# Patient Record
Sex: Female | Born: 1955
Health system: Southern US, Community
[De-identification: ages and names within clinical notes are randomized; demographics above are authoritative.]

## PROBLEM LIST (undated history)

## (undated) DIAGNOSIS — C801 Malignant (primary) neoplasm, unspecified: Secondary | ICD-10-CM

## (undated) DIAGNOSIS — R079 Chest pain, unspecified: Secondary | ICD-10-CM

## (undated) DIAGNOSIS — I4892 Unspecified atrial flutter: Secondary | ICD-10-CM

## (undated) DIAGNOSIS — E0590 Thyrotoxicosis, unspecified without thyrotoxic crisis or storm: Principal | ICD-10-CM

## (undated) DIAGNOSIS — I499 Cardiac arrhythmia, unspecified: Secondary | ICD-10-CM

## (undated) DIAGNOSIS — E119 Type 2 diabetes mellitus without complications: Secondary | ICD-10-CM

## (undated) DIAGNOSIS — E785 Hyperlipidemia, unspecified: Secondary | ICD-10-CM

## (undated) DIAGNOSIS — I10 Essential (primary) hypertension: Secondary | ICD-10-CM

## (undated) DIAGNOSIS — D490 Neoplasm of unspecified behavior of digestive system: Secondary | ICD-10-CM

## (undated) DIAGNOSIS — M1990 Unspecified osteoarthritis, unspecified site: Secondary | ICD-10-CM

## (undated) HISTORY — DX: Unspecified atrial flutter: I48.92

## (undated) HISTORY — DX: Hereditary hemochromatosis: E83.110

## (undated) HISTORY — PX: TONSILLECTOMY: SHX5217

## (undated) HISTORY — DX: Neoplasm of unspecified behavior of digestive system: D49.0

## (undated) HISTORY — PX: COLONOSCOPY W/ POLYPECTOMY: SHX1380

## (undated) HISTORY — DX: Thyrotoxicosis, unspecified without thyrotoxic crisis or storm: E05.90

## (undated) HISTORY — DX: Hyperlipidemia, unspecified: E78.5

## (undated) HISTORY — DX: Chest pain, unspecified: R07.9

---

## 1980-03-13 HISTORY — PX: CHOLECYSTECTOMY: SHX55

## 1997-10-04 ENCOUNTER — Emergency Department (HOSPITAL_COMMUNITY): Admission: EM | Admit: 1997-10-04 | Discharge: 1997-10-04 | Payer: Self-pay | Admitting: Emergency Medicine

## 1999-01-25 ENCOUNTER — Encounter (INDEPENDENT_AMBULATORY_CARE_PROVIDER_SITE_OTHER): Payer: Self-pay

## 1999-01-25 ENCOUNTER — Other Ambulatory Visit: Admission: RE | Admit: 1999-01-25 | Discharge: 1999-01-25 | Payer: Self-pay | Admitting: Obstetrics and Gynecology

## 1999-09-12 ENCOUNTER — Encounter: Payer: Self-pay | Admitting: Obstetrics and Gynecology

## 1999-09-12 ENCOUNTER — Encounter: Admission: RE | Admit: 1999-09-12 | Discharge: 1999-09-12 | Payer: Self-pay | Admitting: Obstetrics and Gynecology

## 2000-03-22 ENCOUNTER — Encounter: Admission: RE | Admit: 2000-03-22 | Discharge: 2000-03-22 | Payer: Self-pay | Admitting: Obstetrics and Gynecology

## 2000-03-22 ENCOUNTER — Encounter: Payer: Self-pay | Admitting: *Deleted

## 2001-06-14 ENCOUNTER — Encounter: Admission: RE | Admit: 2001-06-14 | Discharge: 2001-06-14 | Payer: Self-pay | Admitting: *Deleted

## 2001-06-14 ENCOUNTER — Encounter: Payer: Self-pay | Admitting: *Deleted

## 2001-06-17 ENCOUNTER — Encounter: Admission: RE | Admit: 2001-06-17 | Discharge: 2001-06-17 | Payer: Self-pay | Admitting: *Deleted

## 2001-06-17 ENCOUNTER — Encounter: Payer: Self-pay | Admitting: *Deleted

## 2003-07-03 ENCOUNTER — Emergency Department (HOSPITAL_COMMUNITY): Admission: EM | Admit: 2003-07-03 | Discharge: 2003-07-03 | Payer: Self-pay | Admitting: Family Medicine

## 2006-06-22 ENCOUNTER — Emergency Department (HOSPITAL_COMMUNITY): Admission: EM | Admit: 2006-06-22 | Discharge: 2006-06-22 | Payer: Self-pay | Admitting: Family Medicine

## 2007-06-18 ENCOUNTER — Encounter: Admission: RE | Admit: 2007-06-18 | Discharge: 2007-06-18 | Payer: Self-pay | Admitting: Family Medicine

## 2007-06-18 IMAGING — CR DG CHEST 2V
2 series · 2 of 2 positions shown · non-contrast
Comparison: None

CLINICAL DATA: Cough, congestion, T8

CHEST - 2 VIEW

[w chest pa]
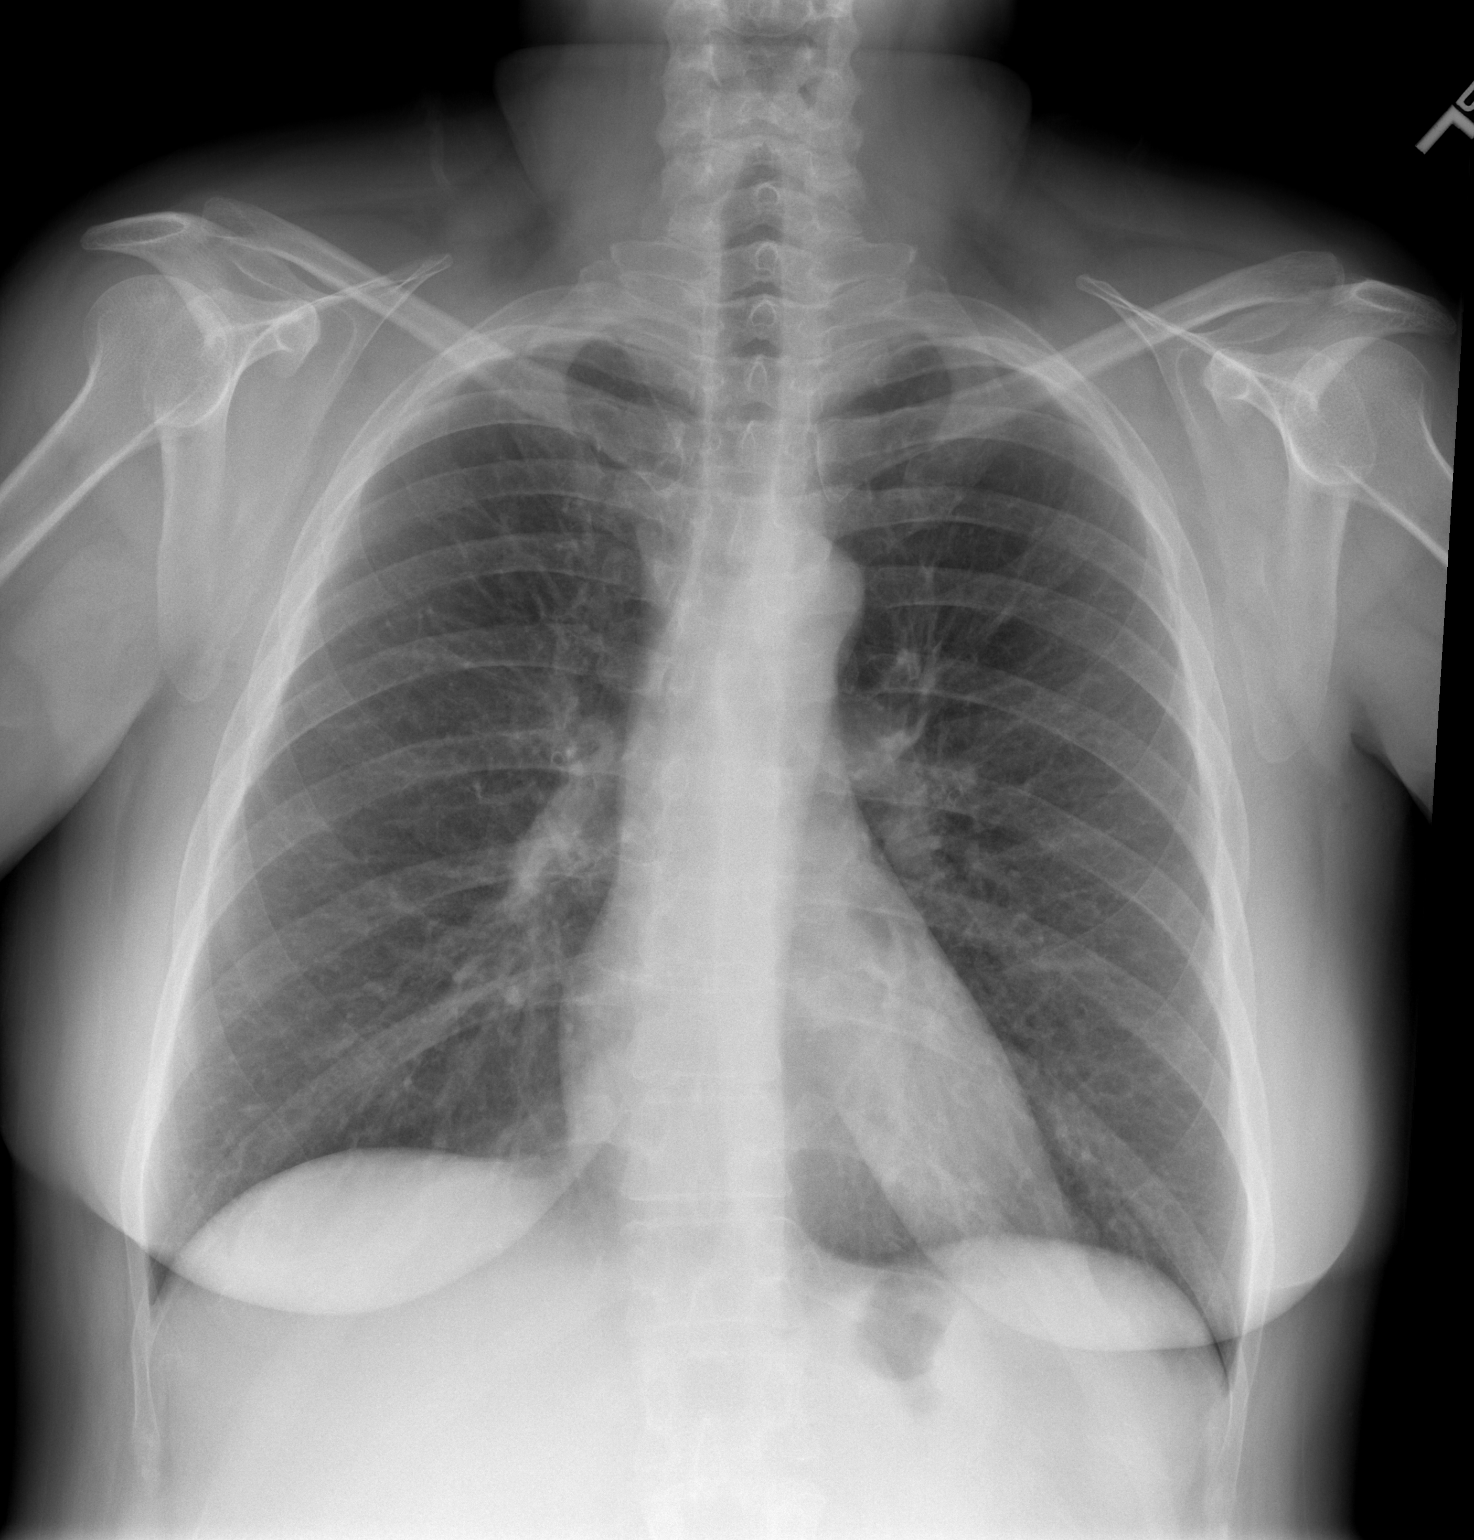

[w chest lat]
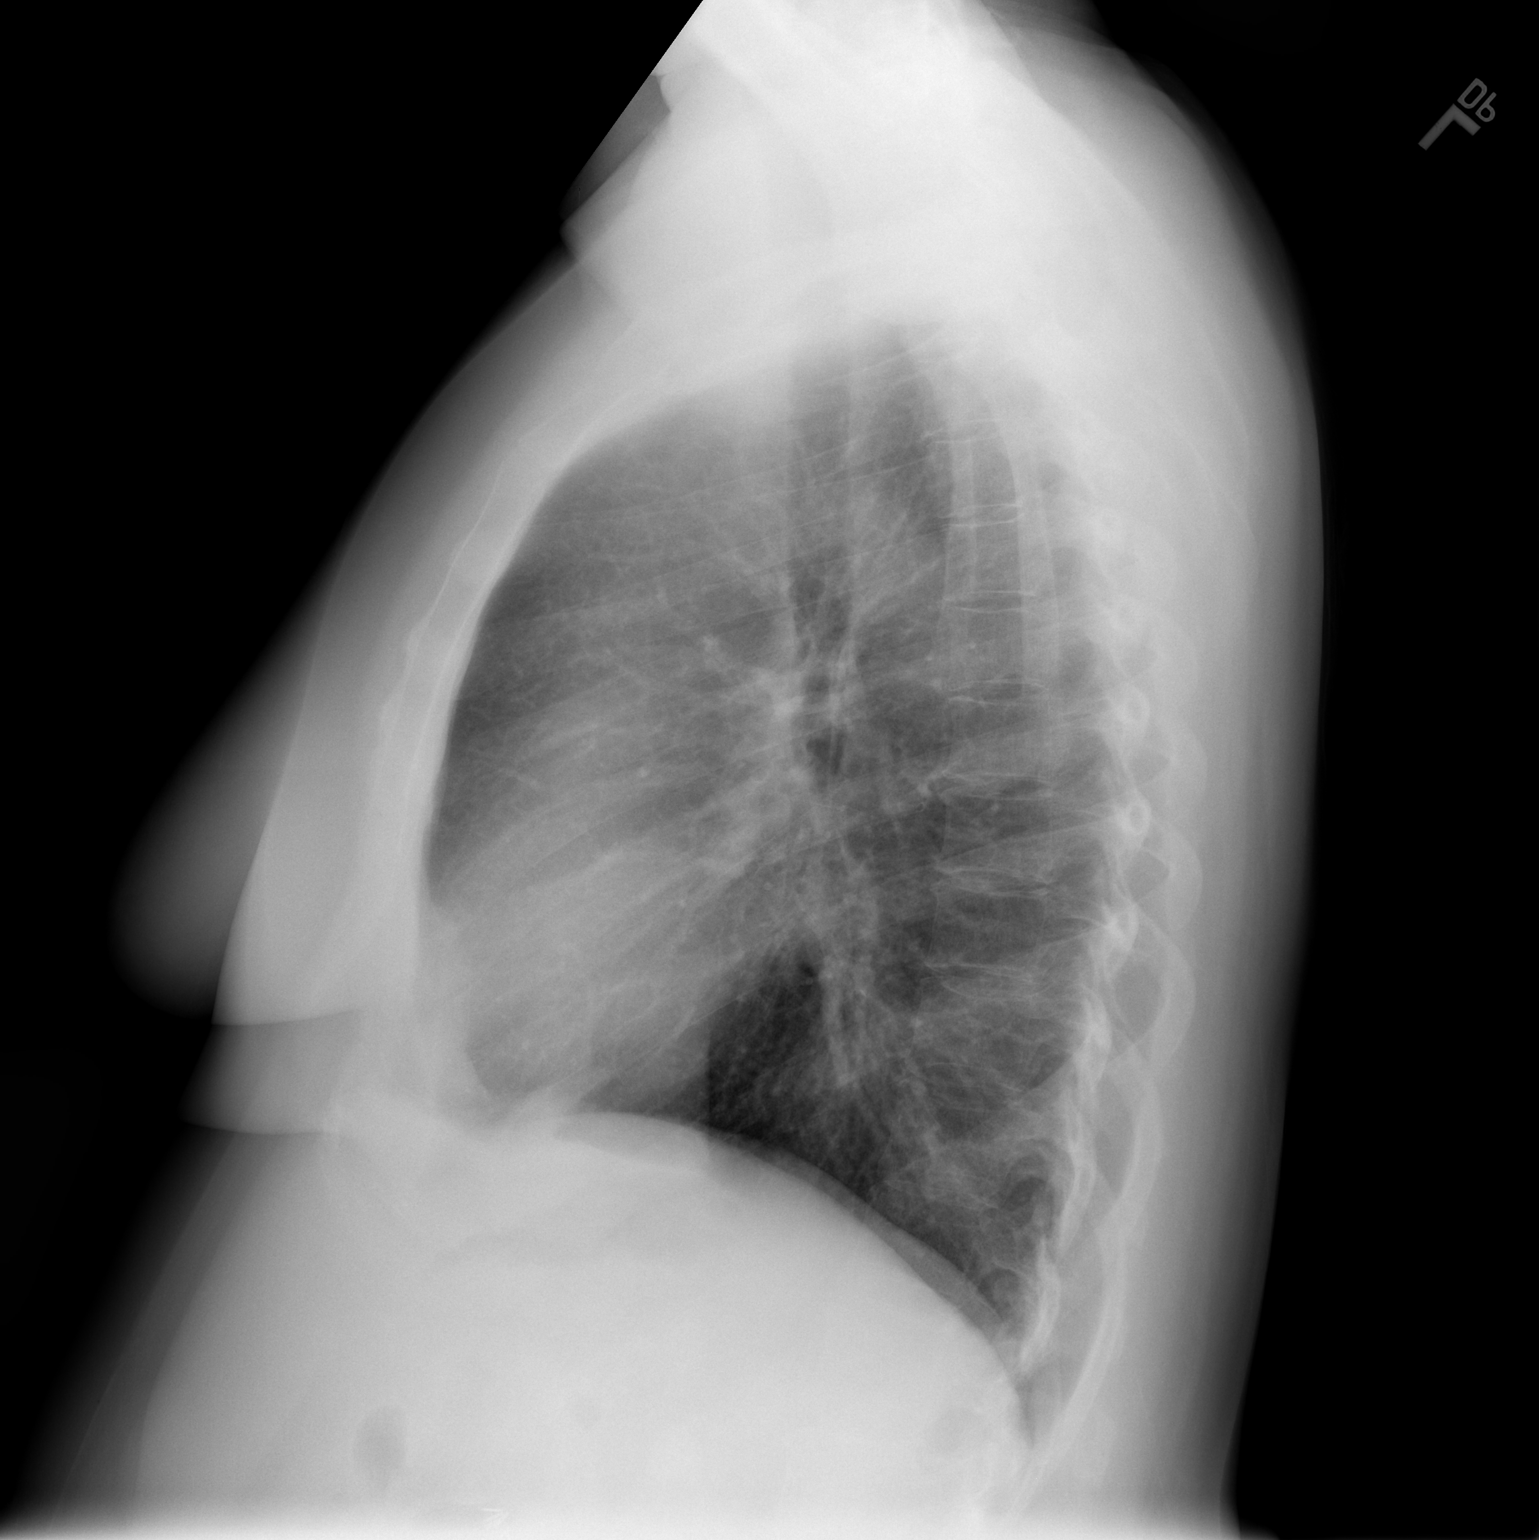

[2 of 2 positions shown; findings below may reference images not displayed]

FINDINGS: No infiltrate or effusion is seen.  Mild peribronchial
thickening is noted.  The heart is within normal limits in size.
IMPRESSION: No active lung disease.  Mild peribronchial thickening.

## 2008-09-23 ENCOUNTER — Inpatient Hospital Stay (HOSPITAL_COMMUNITY): Admission: EM | Admit: 2008-09-23 | Discharge: 2008-09-24 | Payer: Self-pay | Admitting: Emergency Medicine

## 2008-09-23 IMAGING — CR DG CHEST 2V
2 series · 2 of 2 positions shown · non-contrast
Comparison: [DATE]

CLINICAL DATA: Chest pain and tightness with diaphoresis and
indigestion.

CHEST - 2 VIEW

[w chest pa]
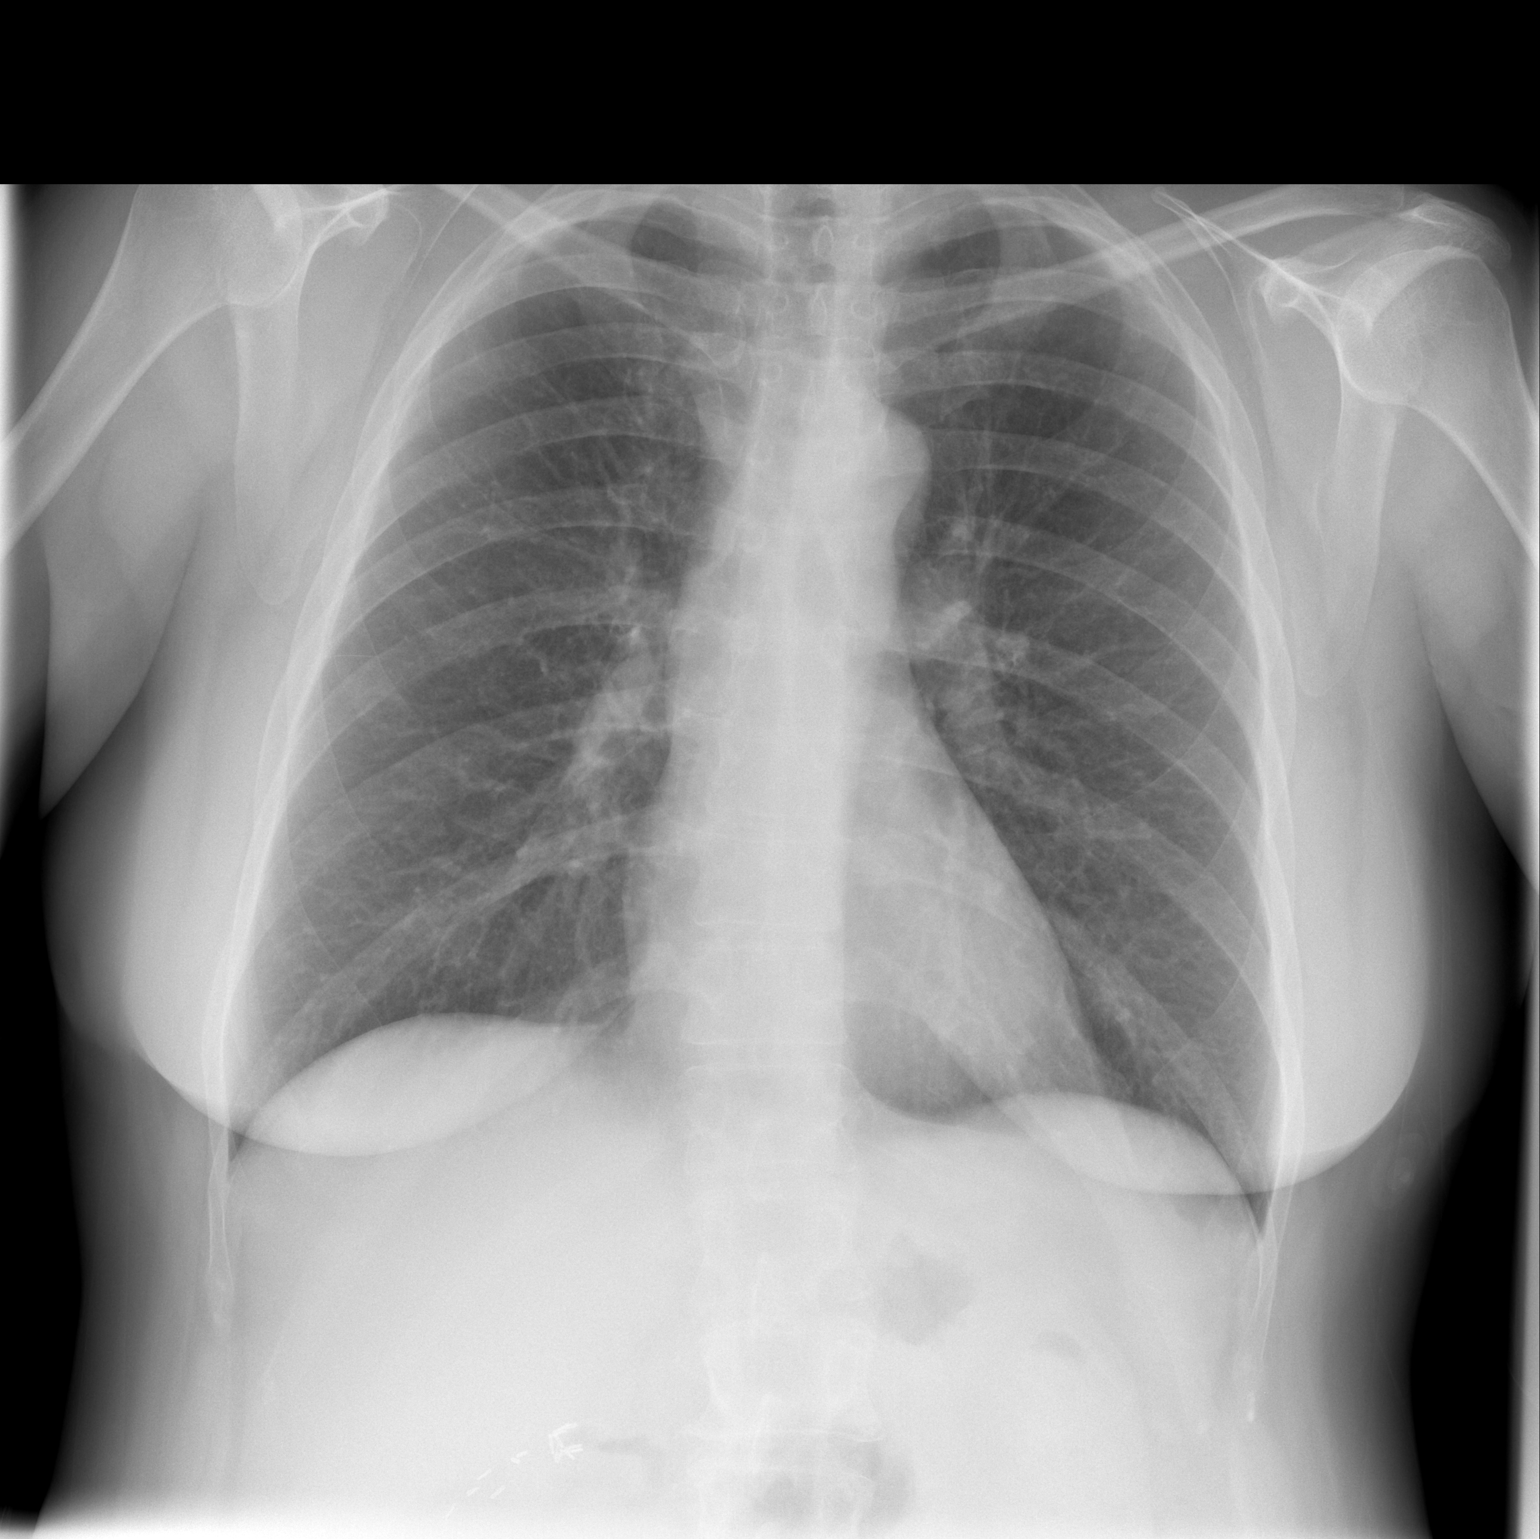

[w chest lat]
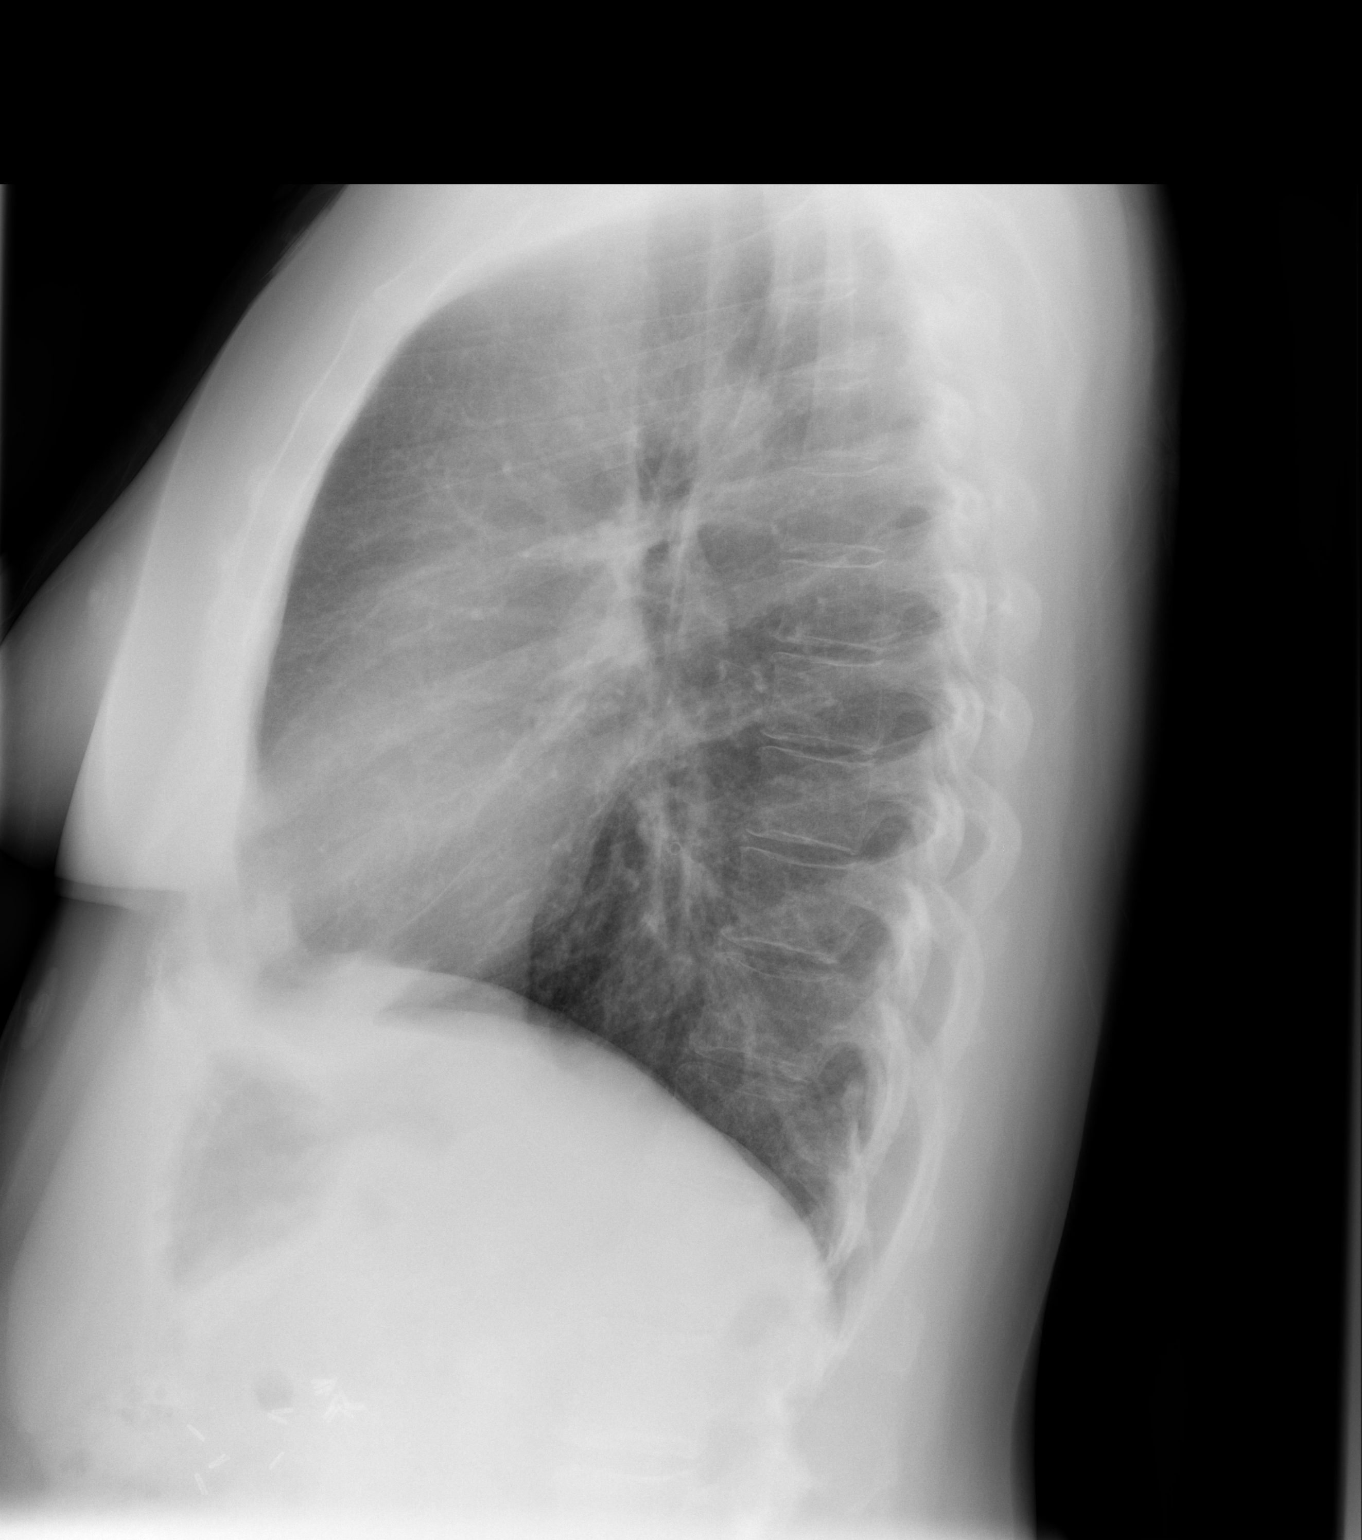

[2 of 2 positions shown; findings below may reference images not displayed]

FINDINGS: Trachea is midline.  Heart size normal.  Biapical pleural
thickening.  Lungs are clear.  No pleural fluid.
IMPRESSION: No acute findings.

## 2009-12-07 ENCOUNTER — Emergency Department (HOSPITAL_COMMUNITY): Admission: EM | Admit: 2009-12-07 | Discharge: 2009-12-07 | Payer: Self-pay | Admitting: Emergency Medicine

## 2009-12-07 IMAGING — CR DG TIBIA/FIBULA 2V*R*
2 series · 2 of 2 positions shown · non-contrast
Comparison: None.

CLINICAL DATA: Fell, pain

RIGHT TIBIA AND FIBULA - 2 VIEW

[view not recorded (1 of 2)]
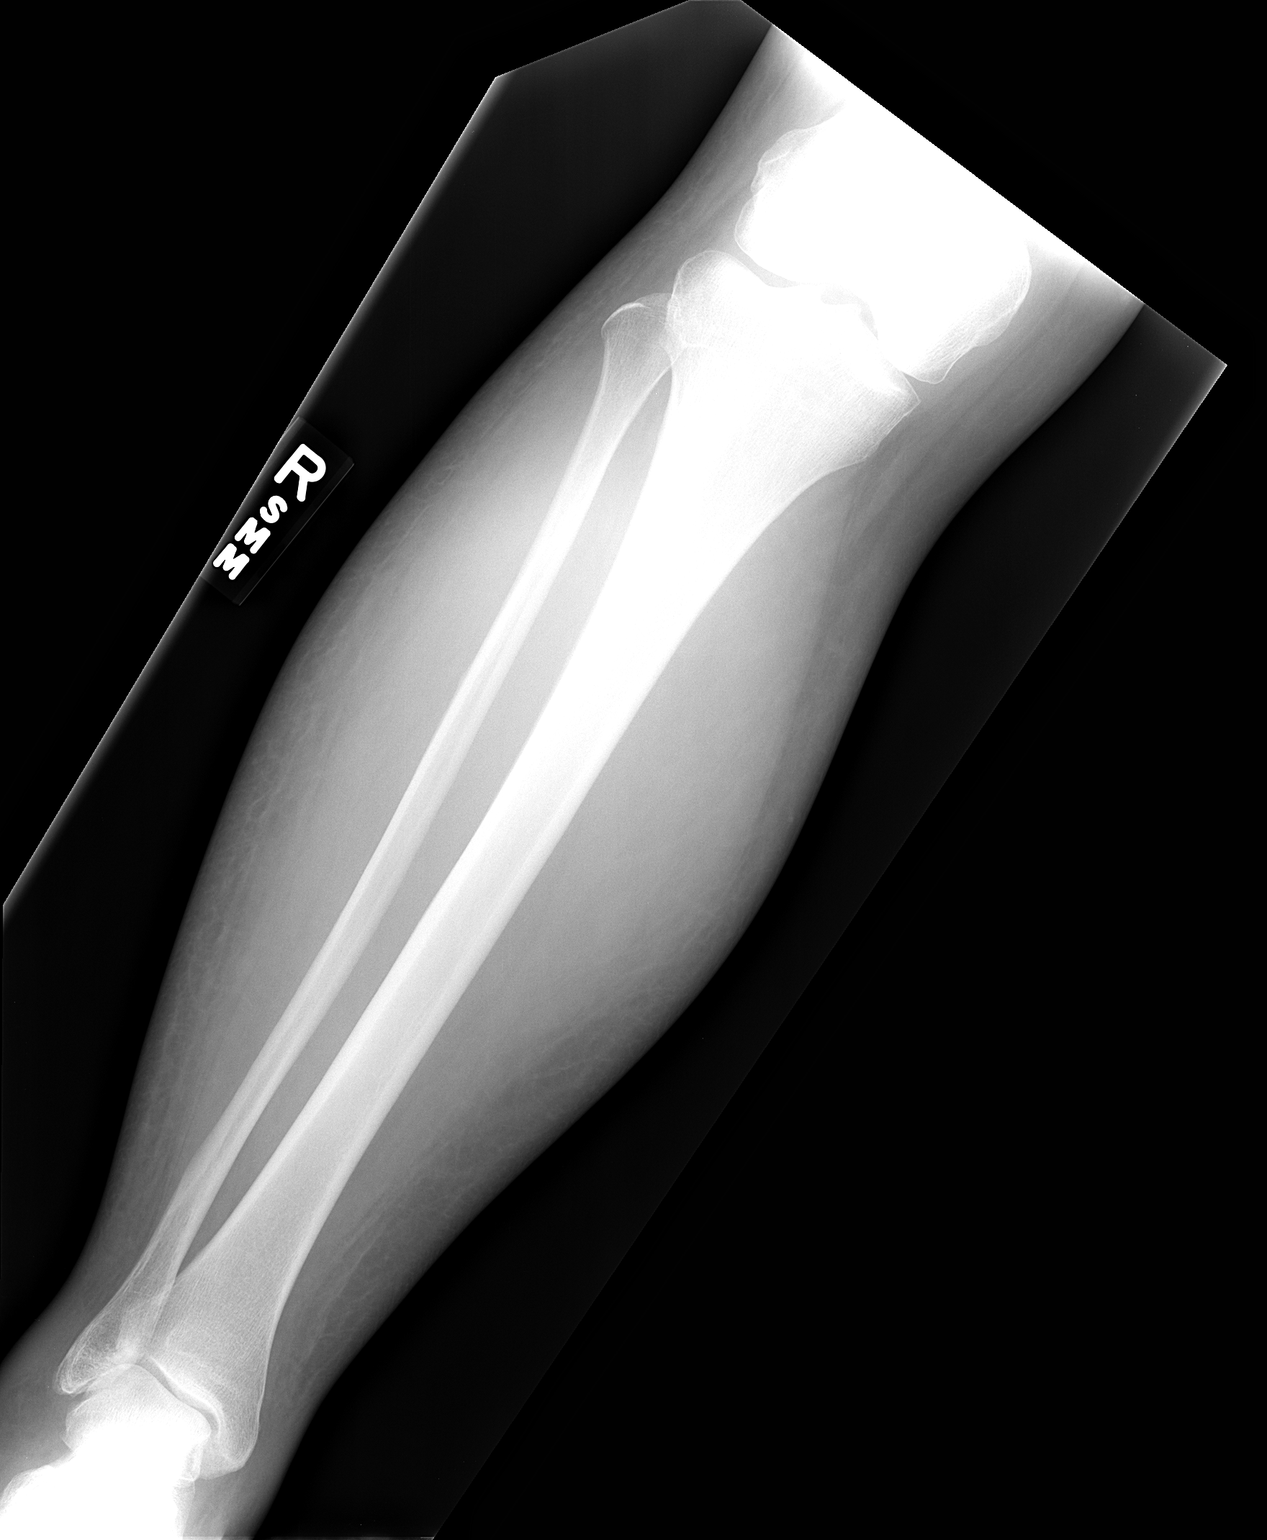

[view not recorded (2 of 2)]
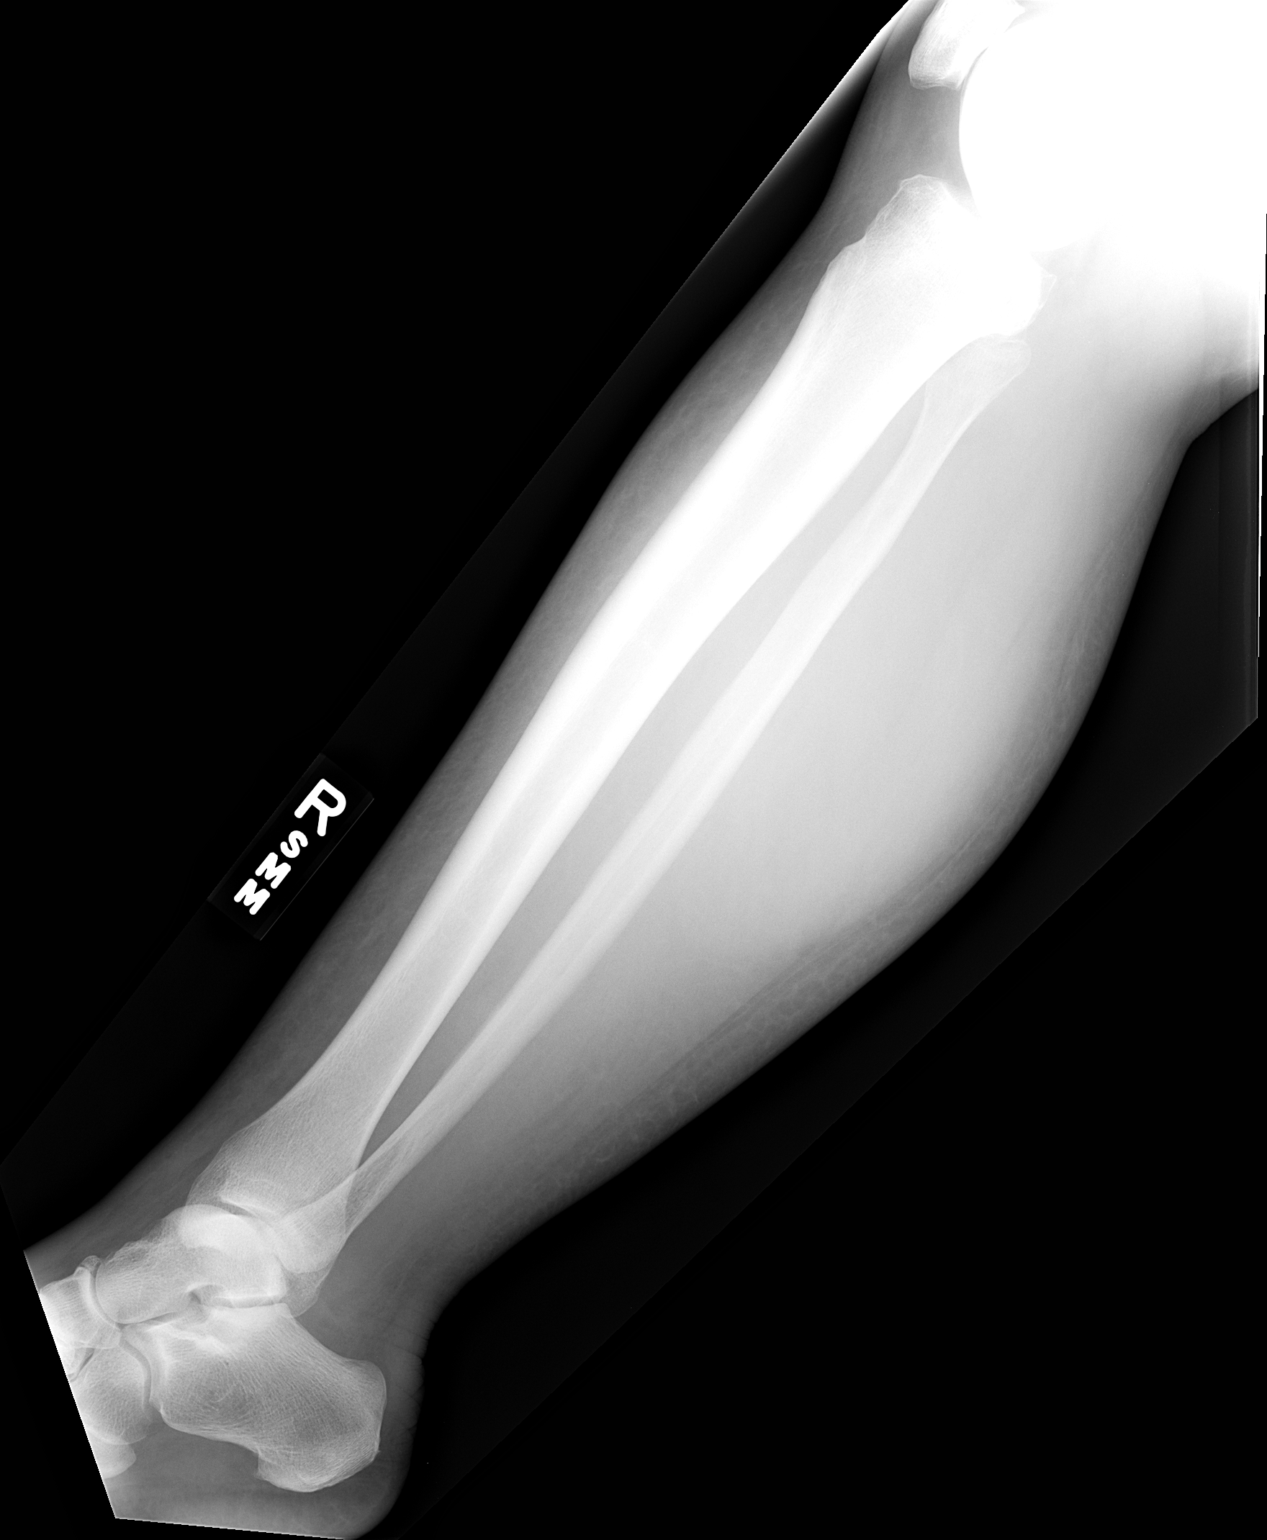

[2 of 2 positions shown; findings below may reference images not displayed]

FINDINGS: Soft tissue swelling is present.  There is no visible
fracture or dislocation.
IMPRESSION: As above.

## 2009-12-07 IMAGING — CR DG FOOT COMPLETE 3+V*R*
4 series · 4 of 4 positions shown · non-contrast
Comparison: None

CLINICAL DATA: Fall, foot pain.

RIGHT FOOT COMPLETE - 3+ VIEW

[view not recorded (1 of 4)]
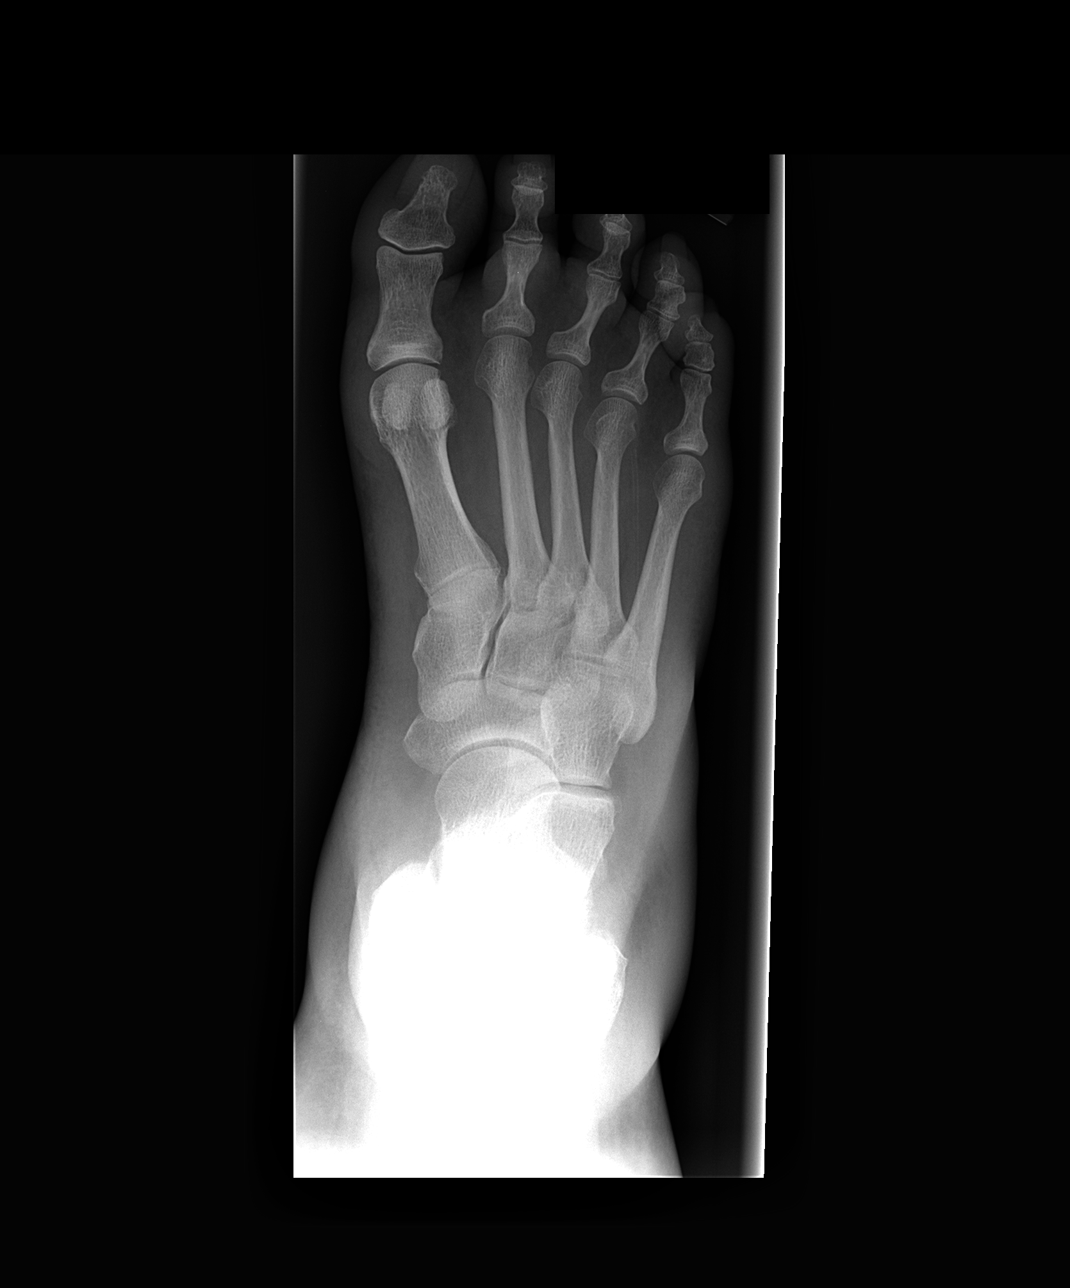

[view not recorded (2 of 4)]
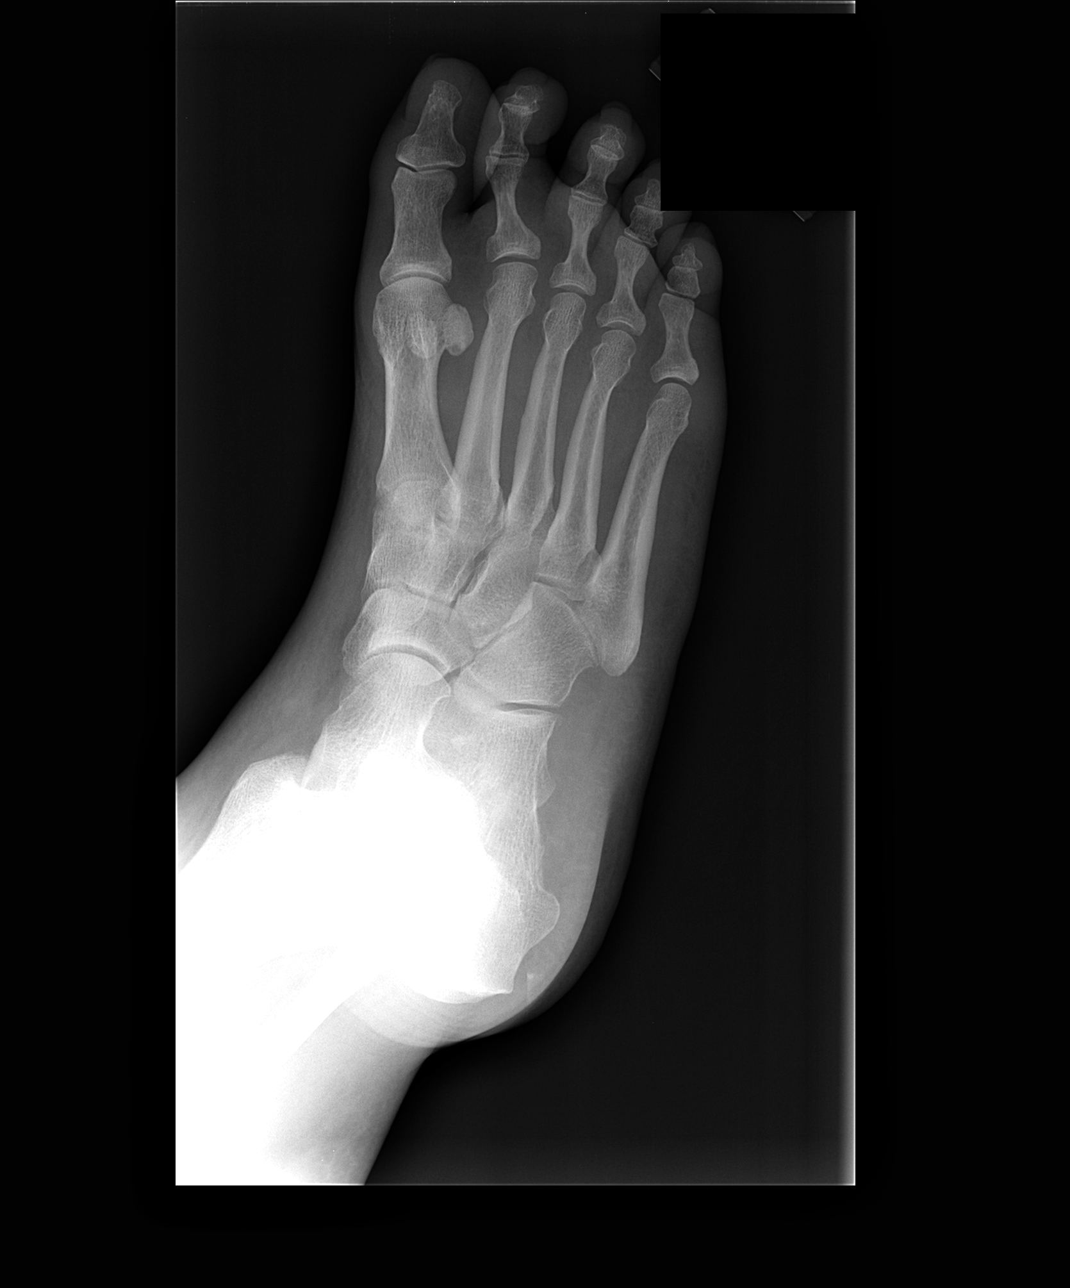

[view not recorded (3 of 4)]
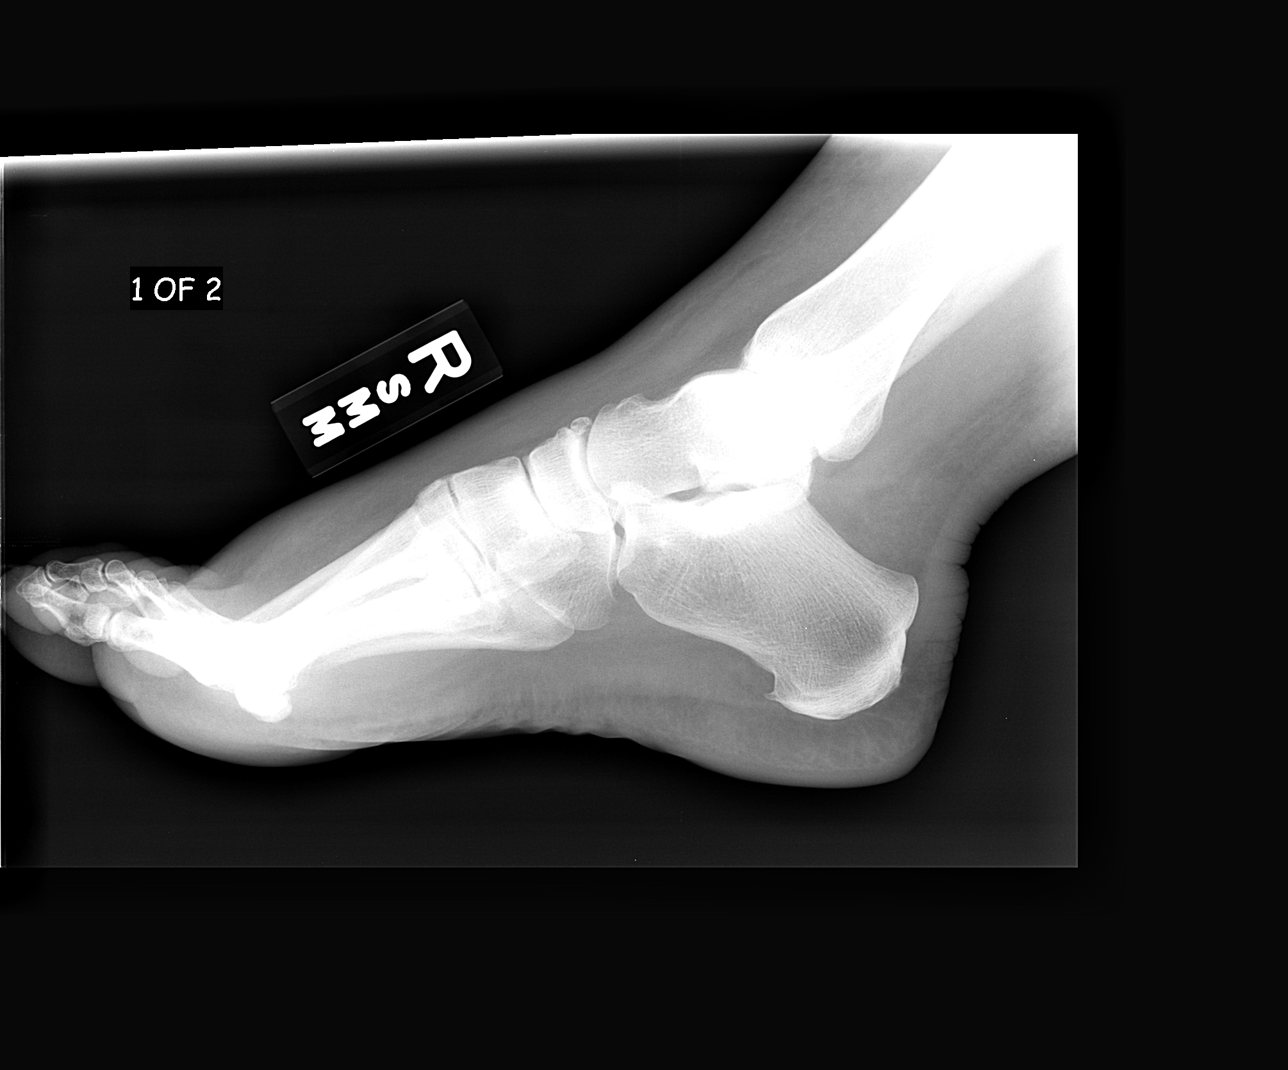

[view not recorded (4 of 4)]
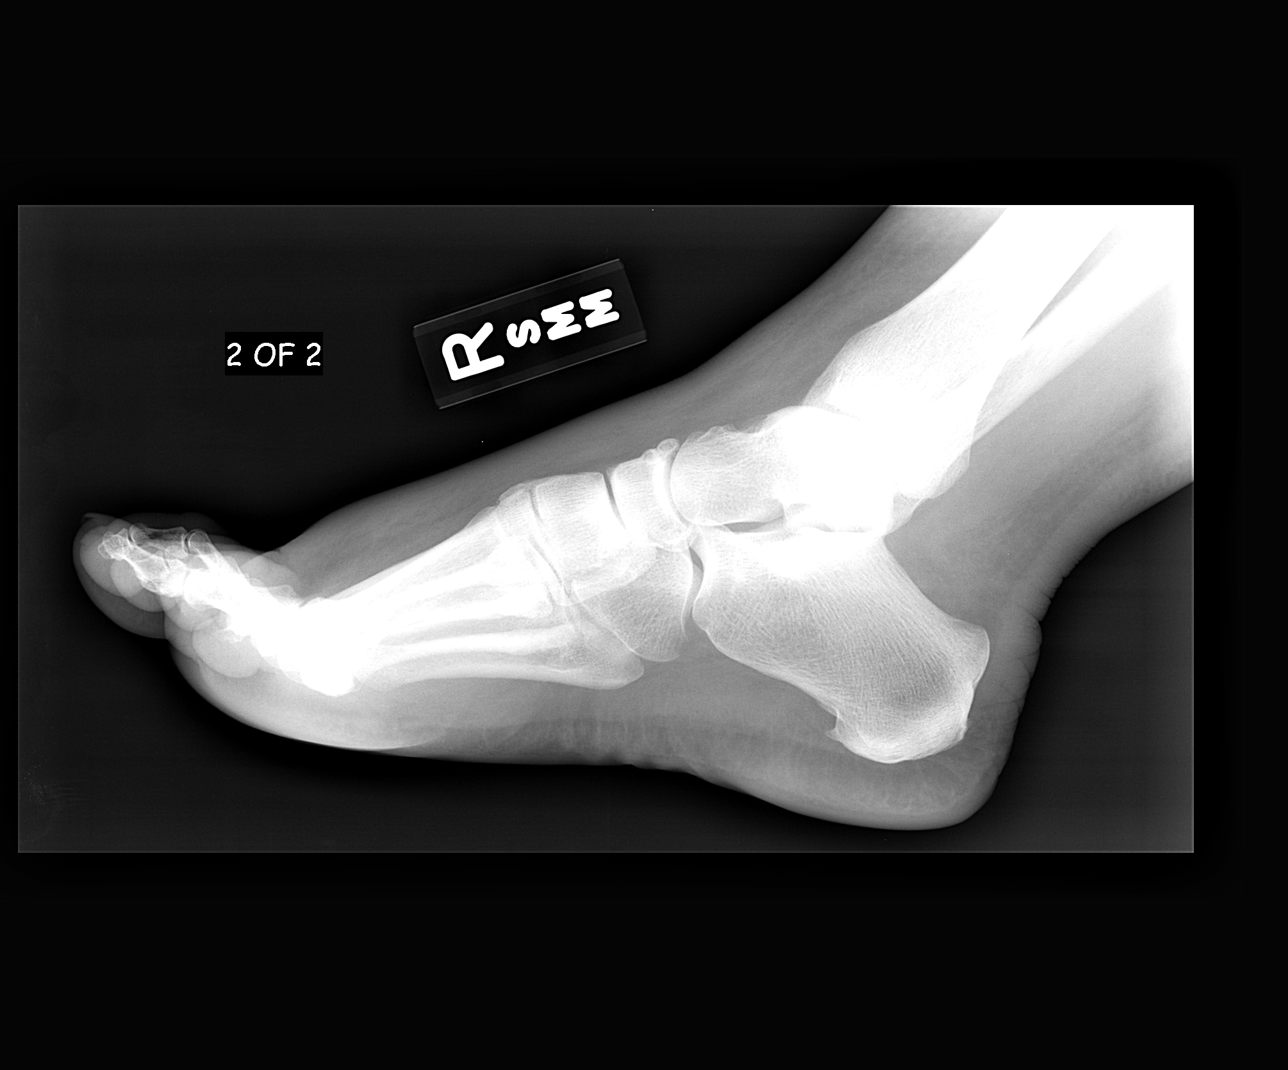

[4 of 4 positions shown; findings below may reference images not displayed]

FINDINGS: No acute bony abnormality.  Specifically, no fracture,
subluxation, or dislocation.  Soft tissues are intact.
IMPRESSION: No acute bony abnormality.

## 2010-04-04 ENCOUNTER — Encounter: Payer: Self-pay | Admitting: Interventional Cardiology

## 2010-06-20 LAB — DIFFERENTIAL
Eosinophils Absolute: 0.3 10*3/uL (ref 0.0–0.7)
Eosinophils Relative: 3 % (ref 0–5)
Lymphs Abs: 3.4 10*3/uL (ref 0.7–4.0)
Monocytes Relative: 6 % (ref 3–12)
Neutrophils Relative %: 58 % (ref 43–77)

## 2010-06-20 LAB — POCT CARDIAC MARKERS
Myoglobin, poc: 50.9 ng/mL (ref 12–200)
Myoglobin, poc: 56.2 ng/mL (ref 12–200)
Troponin i, poc: <0.05 ng/mL (ref 0.00–0.09)

## 2010-06-20 LAB — MAGNESIUM: Magnesium: 1.9 mg/dL (ref 1.5–2.5)

## 2010-06-20 LAB — GLUCOSE, CAPILLARY
Glucose-Capillary: 116 mg/dL — ABNORMAL HIGH (ref 70–99)
Glucose-Capillary: 117 mg/dL — ABNORMAL HIGH (ref 70–99)
Glucose-Capillary: 123 mg/dL — ABNORMAL HIGH (ref 70–99)

## 2010-06-20 LAB — CBC
HCT: 50.5 % — ABNORMAL HIGH (ref 36.0–46.0)
MCV: 91.4 fL (ref 78.0–100.0)
RBC: 5.53 MIL/uL — ABNORMAL HIGH (ref 3.87–5.11)
WBC: 10.3 10*3/uL (ref 4.0–10.5)

## 2010-06-20 LAB — COMPREHENSIVE METABOLIC PANEL
ALT: 22 U/L (ref 0–35)
Calcium: 9.7 mg/dL (ref 8.4–10.5)
Creatinine, Ser: 0.66 mg/dL (ref 0.4–1.2)
Glucose, Bld: 88 mg/dL (ref 70–99)
Sodium: 134 mEq/L — ABNORMAL LOW (ref 135–145)
Total Protein: 7 g/dL (ref 6.0–8.3)

## 2010-06-20 LAB — TSH: TSH: 0.966 u[IU]/mL (ref 0.350–4.500)

## 2010-06-20 LAB — CK TOTAL AND CKMB (NOT AT ARMC)
Relative Index: INVALID (ref 0.0–2.5)
Total CK: 32 U/L (ref 7–177)

## 2010-06-20 LAB — TROPONIN I: Troponin I: 0.02 ng/mL (ref 0.00–0.06)

## 2010-07-26 NOTE — Discharge Summary (Signed)
NAMEMARJARIE, IRION NO.:  [id number]   MEDICAL RECORD NO.:  [id number]          PATIENT TYPE:  INP   LOCATION:  2035                         FACILITY:  [facility]   PHYSICIAN:  Lyn Records, M.D.   DATE OF BIRTH:  January 07, 1956   DATE OF ADMISSION:  09/23/2008  DATE OF DISCHARGE:  09/24/2008                               DISCHARGE SUMMARY   DISCHARGE DIAGNOSES:  1. Chest pain, resolved.  2. Hypertension.  3. Diabetes mellitus.  4. Postmenopausal.   Ms. Demedeiros is a 55 year old female patient with a 81-month history of  chest pressure that has been intermittent in nature.  She had an episode  that was worse on September 23, 2008 associated with diaphoresis.  She was  placed in the hospital for observation.  Her cardiac enzymes were  negative.  For this reason we felt it was safe for her to go home and  scheduled an outpatient Cardiolite at [facility].  Our office  will contact her with the appropriate date and time.  I have given her  sublingual nitroglycerin to use if needed for chest pain.  She is to  call us if she has any further discomfort.   DISCHARGE MEDICATIONS:  Glimepiride daily, lisinopril daily, sublingual  nitroglycerin p.r.n. chest pain.   DISCHARGE INSTRUCTIONS:  She is to remain on a heart-healthy diet.  Increase activity slowly as tolerated.  She is being discharged home in  stable but improved condition.      Guy Franco, P.A.      Lyn Records, M.D.  Electronically Signed    LB/MEDQ  D:  09/24/2008  T:  09/24/2008  Job:  [id number]   cc:   L. Lupe Carney, M.D.

## 2012-12-21 ENCOUNTER — Emergency Department (HOSPITAL_COMMUNITY): Payer: Self-pay

## 2012-12-21 ENCOUNTER — Encounter (HOSPITAL_COMMUNITY): Payer: Self-pay | Admitting: Emergency Medicine

## 2012-12-21 ENCOUNTER — Observation Stay (HOSPITAL_COMMUNITY)
Admission: EM | Admit: 2012-12-21 | Discharge: 2012-12-22 | Disposition: A | Discharge status: Home / Self Care | Payer: Self-pay | Attending: Cardiovascular Disease | Admitting: Cardiovascular Disease

## 2012-12-21 ENCOUNTER — Emergency Department (HOSPITAL_COMMUNITY): Admission: EM | Admit: 2012-12-21 | Discharge: 2012-12-21 | Discharge status: Against Medical Advice | Payer: Self-pay

## 2012-12-21 DIAGNOSIS — I4892 Unspecified atrial flutter: Principal | ICD-10-CM | POA: Insufficient documentation

## 2012-12-21 DIAGNOSIS — I4891 Unspecified atrial fibrillation: Secondary | ICD-10-CM

## 2012-12-21 DIAGNOSIS — E119 Type 2 diabetes mellitus without complications: Secondary | ICD-10-CM | POA: Insufficient documentation

## 2012-12-21 DIAGNOSIS — E876 Hypokalemia: Secondary | ICD-10-CM | POA: Insufficient documentation

## 2012-12-21 DIAGNOSIS — I10 Essential (primary) hypertension: Secondary | ICD-10-CM | POA: Insufficient documentation

## 2012-12-21 DIAGNOSIS — Z79899 Other long term (current) drug therapy: Secondary | ICD-10-CM | POA: Insufficient documentation

## 2012-12-21 HISTORY — DX: Essential (primary) hypertension: I10

## 2012-12-21 HISTORY — DX: Type 2 diabetes mellitus without complications: E11.9

## 2012-12-21 LAB — BASIC METABOLIC PANEL
BUN: 15 mg/dL (ref 6–23)
BUN: 15 mg/dL (ref 6–23)
CO2: 23 mEq/L (ref 19–32)
CO2: 24 mEq/L (ref 19–32)
Calcium: 9.6 mg/dL (ref 8.4–10.5)
Calcium: 8.6 mg/dL (ref 8.4–10.5)
GFR calc non Af Amer: >90 mL/min (ref >90)
GFR calc non Af Amer: >90 mL/min (ref >90)
Glucose, Bld: 345 mg/dL — ABNORMAL HIGH (ref 70–99)
Glucose, Bld: 344 mg/dL — ABNORMAL HIGH (ref 70–99)
Potassium: 2.7 mEq/L — CL (ref 3.5–5.1)
Potassium: 3.5 mEq/L (ref 3.5–5.1)
Sodium: 134 mEq/L — ABNORMAL LOW (ref 135–145)

## 2012-12-21 LAB — CBC
HCT: 45.3 % (ref 36.0–46.0)
MCH: 30.8 pg (ref 26.0–34.0)
MCHC: 36.4 g/dL — ABNORMAL HIGH (ref 30.0–36.0)
Platelets: 234 10*3/uL (ref 150–400)
RBC: 5.35 MIL/uL — ABNORMAL HIGH (ref 3.87–5.11)
WBC: 10.7 10*3/uL — ABNORMAL HIGH (ref 4.0–10.5)

## 2012-12-21 LAB — MRSA PCR SCREENING: MRSA by PCR: NEGATIVE

## 2012-12-21 LAB — PRO B NATRIURETIC PEPTIDE: Pro B Natriuretic peptide (BNP): 50.3 pg/mL (ref 0–125)

## 2012-12-21 LAB — POCT I-STAT TROPONIN I: Troponin i, poc: 0.01 ng/mL (ref 0.00–0.08)

## 2012-12-21 IMAGING — CR DG CHEST 1V PORT
1 series · 1 of 1 positions shown · non-contrast
Comparison: No priors.

CLINICAL DATA: Heart palpitations. Shortness of breath. Cough and
congestion.

EXAM:
PORTABLE CHEST - 1 VIEW

[AP]
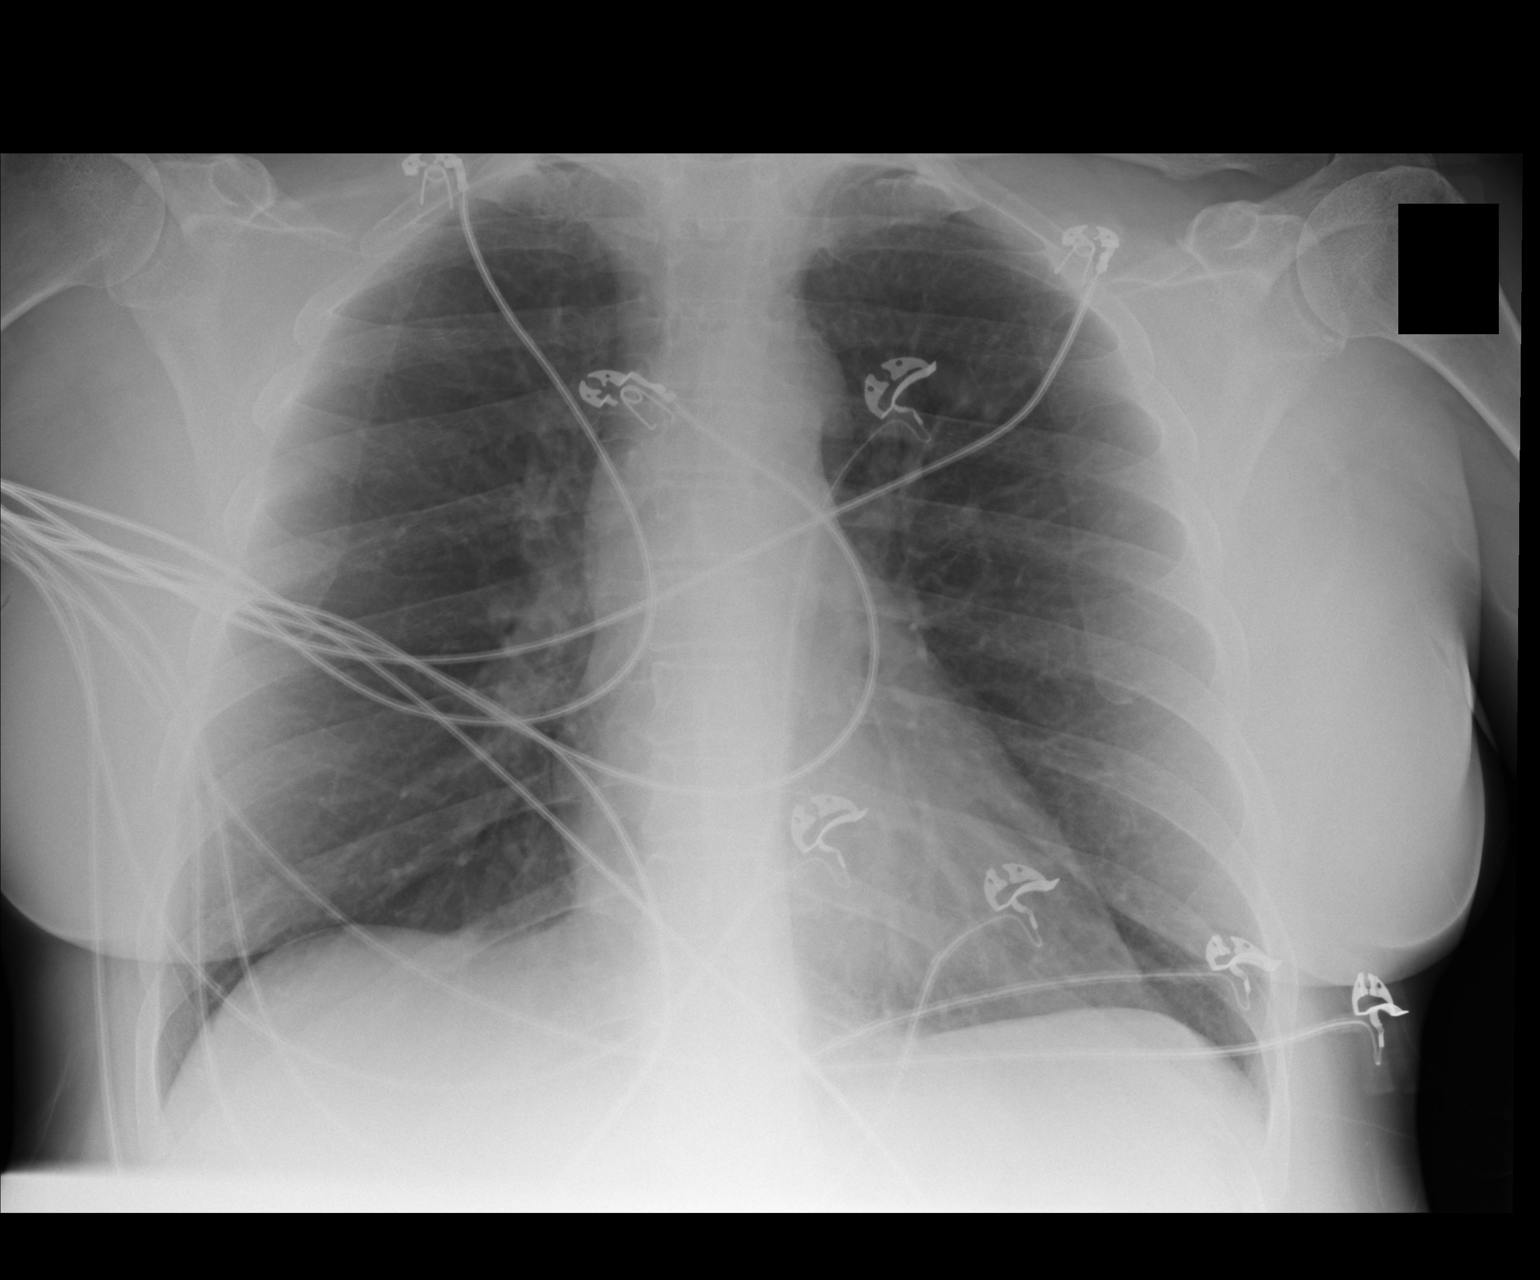

[1 of 1 positions shown; findings below may reference images not displayed]

FINDINGS: Lung volumes are normal. No consolidative airspace disease. No
pleural effusions. No pneumothorax. No pulmonary nodule or mass
noted. Pulmonary vasculature and the cardiomediastinal silhouette
are within normal limits.
IMPRESSION: 1.  No radiographic evidence of acute cardiopulmonary disease.

## 2012-12-21 MED ORDER — METOPROLOL TARTRATE 1 MG/ML IV SOLN
5.0000 mg | Freq: Once | INTRAVENOUS | Status: AC
  Administered 2012-12-21: 5 mg via INTRAVENOUS
  Filled 2012-12-21: qty 5

## 2012-12-21 MED ORDER — AMIODARONE HCL IN DEXTROSE 360-4.14 MG/200ML-% IV SOLN
30.0000 mg/h | INTRAVENOUS | Status: DC
  Administered 2012-12-21: 30 mg/h via INTRAVENOUS
  Filled 2012-12-21 (×4): qty 200

## 2012-12-21 MED ORDER — METOPROLOL TARTRATE 1 MG/ML IV SOLN
INTRAVENOUS | Status: AC
  Filled 2012-12-21: qty 5

## 2012-12-21 MED ORDER — DOCUSATE SODIUM 100 MG PO CAPS
100.0000 mg | ORAL_CAPSULE | Freq: Two times a day (BID) | ORAL | Status: DC
  Filled 2012-12-21 (×3): qty 1

## 2012-12-21 MED ORDER — AMIODARONE LOAD VIA INFUSION
150.0000 mg | Freq: Once | INTRAVENOUS | Status: AC
  Administered 2012-12-21: 150 mg via INTRAVENOUS
  Filled 2012-12-21: qty 83.34

## 2012-12-21 MED ORDER — AMIODARONE HCL IN DEXTROSE 360-4.14 MG/200ML-% IV SOLN
60.0000 mg/h | INTRAVENOUS | Status: AC
  Administered 2012-12-21 (×2): 60 mg/h via INTRAVENOUS
  Filled 2012-12-21 (×2): qty 200

## 2012-12-21 MED ORDER — HEPARIN SODIUM (PORCINE) 5000 UNIT/ML IJ SOLN
5000.0000 [IU] | Freq: Three times a day (TID) | INTRAMUSCULAR | Status: DC
  Administered 2012-12-21 – 2012-12-22 (×3): 5000 [IU] via SUBCUTANEOUS
  Filled 2012-12-21 (×5): qty 1

## 2012-12-21 MED ORDER — METOPROLOL TARTRATE 1 MG/ML IV SOLN
5.0000 mg | Freq: Once | INTRAVENOUS | Status: AC
  Administered 2012-12-21: 5 mg via INTRAVENOUS

## 2012-12-21 MED ORDER — DIGOXIN 0.25 MG/ML IJ SOLN
0.2500 mg | Freq: Once | INTRAMUSCULAR | Status: AC
  Administered 2012-12-21: 0.25 mg via INTRAVENOUS
  Filled 2012-12-21: qty 1

## 2012-12-21 MED ORDER — METOPROLOL TARTRATE 50 MG PO TABS
50.0000 mg | ORAL_TABLET | Freq: Two times a day (BID) | ORAL | Status: DC
  Administered 2012-12-21 – 2012-12-22 (×3): 50 mg via ORAL
  Filled 2012-12-21 (×3): qty 1
  Filled 2012-12-21: qty 2

## 2012-12-21 MED ORDER — DILTIAZEM LOAD VIA INFUSION
20.0000 mg | Freq: Once | INTRAVENOUS | Status: AC
  Administered 2012-12-21: 20 mg via INTRAVENOUS
  Filled 2012-12-21: qty 20

## 2012-12-21 MED ORDER — POTASSIUM CHLORIDE 10 MEQ/100ML IV SOLN
10.0000 meq | INTRAVENOUS | Status: AC
  Administered 2012-12-21: 10 meq via INTRAVENOUS
  Filled 2012-12-21: qty 100

## 2012-12-21 MED ORDER — ONDANSETRON HCL 4 MG/2ML IJ SOLN: 4.0000 mg | Freq: Four times a day (QID) | INTRAMUSCULAR | Status: DC | PRN

## 2012-12-21 MED ORDER — INSULIN ASPART 100 UNIT/ML ~~LOC~~ SOLN
4.0000 [IU] | Freq: Three times a day (TID) | SUBCUTANEOUS | Status: DC
  Administered 2012-12-22 (×2): 4 [IU] via SUBCUTANEOUS

## 2012-12-21 MED ORDER — ONDANSETRON HCL 4 MG PO TABS: 4.0000 mg | ORAL_TABLET | Freq: Four times a day (QID) | ORAL | Status: DC | PRN

## 2012-12-21 MED ORDER — POTASSIUM CHLORIDE CRYS ER 20 MEQ PO TBCR
40.0000 meq | EXTENDED_RELEASE_TABLET | Freq: Once | ORAL | Status: AC
  Administered 2012-12-21: 40 meq via ORAL
  Filled 2012-12-21: qty 2

## 2012-12-21 MED ORDER — SODIUM CHLORIDE 0.9 % IJ SOLN
3.0000 mL | Freq: Two times a day (BID) | INTRAMUSCULAR | Status: DC
  Administered 2012-12-22: 12:00:00 via INTRAVENOUS

## 2012-12-21 MED ORDER — DIGOXIN 0.25 MG/ML IJ SOLN
0.2500 mg | Freq: Once | INTRAMUSCULAR | Status: AC
  Administered 2012-12-21: 0.25 mg via INTRAVENOUS
  Filled 2012-12-21: qty 2

## 2012-12-21 MED ORDER — DILTIAZEM HCL 100 MG IV SOLR
5.0000 mg/h | INTRAVENOUS | Status: DC
  Administered 2012-12-21: 15 mg/h via INTRAVENOUS
  Administered 2012-12-21: 5 mg/h via INTRAVENOUS
  Administered 2012-12-21: 10 mg/h via INTRAVENOUS
  Administered 2012-12-21: 20 mg/h via INTRAVENOUS
  Filled 2012-12-21: qty 100

## 2012-12-21 MED ORDER — INSULIN GLARGINE 100 UNIT/ML ~~LOC~~ SOLN
10.0000 [IU] | Freq: Every day | SUBCUTANEOUS | Status: DC
  Administered 2012-12-21: 10 [IU] via SUBCUTANEOUS
  Filled 2012-12-21 (×2): qty 0.1

## 2012-12-21 MED ORDER — ACETAMINOPHEN 650 MG RE SUPP: 650.0000 mg | Freq: Four times a day (QID) | RECTAL | Status: DC | PRN

## 2012-12-21 MED ORDER — ACETAMINOPHEN 325 MG PO TABS: 650.0000 mg | ORAL_TABLET | Freq: Four times a day (QID) | ORAL | Status: DC | PRN

## 2012-12-21 MED ORDER — ADULT MULTIVITAMIN W/MINERALS CH
1.0000 | ORAL_TABLET | Freq: Every day | ORAL | Status: DC
  Administered 2012-12-21 – 2012-12-22 (×2): 1 via ORAL
  Filled 2012-12-21 (×2): qty 1

## 2012-12-21 MED ORDER — ALUM & MAG HYDROXIDE-SIMETH 200-200-20 MG/5ML PO SUSP: 30.0000 mL | Freq: Four times a day (QID) | ORAL | Status: DC | PRN

## 2012-12-21 MED ORDER — DEXTROSE 5 % IV SOLN: 60.0000 mg/h | Freq: Once | INTRAVENOUS | Status: DC

## 2012-12-21 MED ORDER — INSULIN ASPART 100 UNIT/ML ~~LOC~~ SOLN
0.0000 [IU] | Freq: Three times a day (TID) | SUBCUTANEOUS | Status: DC
  Administered 2012-12-22 (×2): 3 [IU] via SUBCUTANEOUS
  Administered 2012-12-22: 8 [IU] via SUBCUTANEOUS

## 2012-12-21 NOTE — H&P (Signed)
Christy Abbott is an 57 y.o. female.   Chief Complaint: Palpitation HPI: 57 years old female with hypertension and diabetes has palpitation without significant caffeine or other stimulant intake. She had past history of occasional palpitations but claims to have no work-up before. EKG shows atrial flutter with 2:1 AV conduction. IV diltiazem, metoprolol and amiodarone has not helped much. She has 2 doses of IV lanoxin with mild improvement in heart rate.  Past Medical History  Diagnosis Date  . Hypertension   . Diabetes mellitus without complication       History reviewed. No pertinent past surgical history.  History reviewed. No pertinent family history. Social History:  reports that she has quit smoking. She does not have any smokeless tobacco history on file. She reports that she does not drink alcohol or use illicit drugs.  Allergies: No Known Allergies   (Not in a hospital admission)  Results for orders placed during the hospital encounter of 12/21/12 (from the past 48 hour(s))  CBC     Status: Abnormal   Collection Time    12/21/12  3:14 PM      Result Value Range   WBC 10.7 (*) 4.0 - 10.5 K/uL   RBC 5.35 (*) 3.87 - 5.11 MIL/uL   Hemoglobin 16.5 (*) 12.0 - 15.0 g/dL   HCT 16.1  09.6 - 04.5 %   MCV 84.7  78.0 - 100.0 fL   MCH 30.8  26.0 - 34.0 pg   MCHC 36.4 (*) 30.0 - 36.0 g/dL   RDW 40.9  81.1 - 91.4 %   Platelets 234  150 - 400 K/uL  BASIC METABOLIC PANEL     Status: Abnormal   Collection Time    12/21/12  3:14 PM      Result Value Range   Sodium 134 (*) 135 - 145 mEq/L   Potassium 2.7 (*) 3.5 - 5.1 mEq/L   Comment: CRITICAL RESULT CALLED TO, READ BACK BY AND VERIFIED WITH:     S.[facility] 1631 12/21/12 M.CAMPBELL   Chloride 94 (*) 96 - 112 mEq/L   CO2 23  19 - 32 mEq/L   Glucose, Bld 345 (*) 70 - 99 mg/dL   BUN 15  6 - 23 mg/dL   Creatinine, Ser 7.82  0.50 - 1.10 mg/dL   Calcium 9.6  8.4 - 95.6 mg/dL   GFR calc non Af Amer >90  >90 mL/min   GFR calc Af Amer  >90  >90 mL/min   Comment: (NOTE)     The eGFR has been calculated using the CKD EPI equation.     This calculation has not been validated in all clinical situations.     eGFR's persistently <90 mL/min signify possible Chronic Kidney     Disease.  PRO B NATRIURETIC PEPTIDE     Status: None   Collection Time    12/21/12  3:14 PM      Result Value Range   Pro B Natriuretic peptide (BNP) 50.3  0 - 125 pg/mL  POCT I-STAT TROPONIN I     Status: None   Collection Time    12/21/12  3:25 PM      Result Value Range   Troponin i, poc 0.01  0.00 - 0.08 ng/mL   Comment 3            Comment: Due to the release kinetics of cTnI,     a negative result within the first hours     of the onset of  symptoms does not rule out     myocardial infarction with certainty.     If myocardial infarction is still suspected,     repeat the test at appropriate intervals.   Dg Chest Port 1 View  12/21/2012   CLINICAL DATA:  Heart palpitations. Shortness of breath. Cough and congestion.  EXAM: PORTABLE CHEST - 1 VIEW  COMPARISON:  No priors.  FINDINGS: Lung volumes are normal. No consolidative airspace disease. No pleural effusions. No pneumothorax. No pulmonary nodule or mass noted. Pulmonary vasculature and the cardiomediastinal silhouette are within normal limits.  IMPRESSION: 1.  No radiographic evidence of acute cardiopulmonary disease.   Electronically Signed   By: Trudie Reed M.D.   On: 12/21/2012 16:34    ROS No weight gain or loss. No nausea, vomiting, Occasional chest pain, No asthma or COPD, No GI bleed, No hemoptysis, No GU bleed, No stroke, seizures or psych admission.  Blood pressure 122/97, pulse 154, temperature 97.7 F (36.5 C), temperature source Oral, resp. rate 13, height 5\' 4"  (1.626 m), weight 81.647 kg (180 lb), SpO2 95.00%. General appearance: alert, cooperative and appears stated age Head: Normocephalic, without obvious abnormality, atraumatic Eyes: conjunctivae/corneas clear. PERRL,  EOM's intact. Fundi benign. Nose: Nares normal. Septum midline. Mucosa normal. No drainage or sinus tenderness. Neck: no adenopathy, no carotid bruit, no JVD, supple, symmetrical, trachea midline and thyroid not enlarged, symmetric, no tenderness/mass/nodules Resp: clear to auscultation bilaterally Cardio: regular rate and rhythm, S1, S2 normal, no murmur, click, rub or gallop GI: soft, non-tender; bowel sounds normal; no masses,  no organomegaly Extremities: extremities normal, atraumatic, no cyanosis or edema Pulses: 2+ and symmetric Skin: Skin color, texture, turgor normal. No rashes or lesions Neurologic: Alert and oriented X 3, normal strength and tone. Normal symmetric reflexes. Normal coordination and gait  Assessment/Plan Atrial flutter with rapid ventricular response DM, II Hypertension Hypokalemia  Place in observation/ Monitor/Amiodarone/Check TSH.  Jericha Bryden S 12/21/2012, 6:52 PM

## 2012-12-21 NOTE — ED Notes (Signed)
Dr. Blinda Leatherwood made aware patient HR remains 155 despite pharmaceutical intervention.

## 2012-12-21 NOTE — ED Notes (Signed)
Family at bedside. 

## 2012-12-21 NOTE — ED Notes (Signed)
ekg was done at triage and shown to dr Blinda Leatherwood

## 2012-12-21 NOTE — ED Provider Notes (Addendum)
CSN: [id number]     Arrival date & time 12/21/12  1448 History   First MD Initiated Contact with Patient 12/21/12 1509     Chief Complaint  Patient presents with  . Palpitations   (Consider location/radiation/quality/duration/timing/severity/associated sxs/prior Treatment) HPI Comments: Patient presents to the ER for evaluation of severe palpitations. Patient reports that at 2 PM she had sudden onset of feeling like her heart was racing and pounding, slight tightness in her chest. She felt slightly short of breath. She reports that her heart rate was very elevated, came to the ER for evaluation. She says she had a similar episode years ago and was admitted, but never followed up with a cardiologist.  Patient is a 57 y.o. female presenting with palpitations.  Palpitations Associated symptoms: shortness of breath     Past Medical History  Diagnosis Date  . Hypertension   . Diabetes mellitus without complication    History reviewed. No pertinent past surgical history. History reviewed. No pertinent family history. History  Substance Use Topics  . Smoking status: Former Games developer  . Smokeless tobacco: Not on file  . Alcohol Use: No   OB History   Grav Para Term Preterm Abortions TAB SAB Ect Mult Living                 Review of Systems  Respiratory: Positive for chest tightness and shortness of breath.   Cardiovascular: Positive for palpitations.  All other systems reviewed and are negative.    Allergies  Review of patient's allergies indicates no known allergies.  Home Medications  No current outpatient prescriptions on file. BP 143/94  Pulse 155  Temp(Src) 97.7 F (36.5 C) (Oral)  Resp 14  Ht 5\' 4"  (1.626 m)  Wt 180 lb (81.647 kg)  BMI 30.88 kg/m2  SpO2 99% Physical Exam  Constitutional: She is oriented to person, place, and time. She appears well-developed and well-nourished. No distress.  HENT:  Head: Normocephalic and atraumatic.  Right Ear: Hearing normal.   Left Ear: Hearing normal.  Nose: Nose normal.  Mouth/Throat: Oropharynx is clear and moist and mucous membranes are normal.  Eyes: Conjunctivae and EOM are normal. Pupils are equal, round, and reactive to light.  Neck: Normal range of motion. Neck supple.  Cardiovascular: S1 normal and S2 normal.  Tachycardia present.  Exam reveals no gallop and no friction rub.   No murmur heard. Pulmonary/Chest: Effort normal and breath sounds normal. No respiratory distress. She exhibits no tenderness.  Abdominal: Soft. Normal appearance and bowel sounds are normal. There is no hepatosplenomegaly. There is no tenderness. There is no rebound, no guarding, no tenderness at McBurney's point and negative Murphy's sign. No hernia.  Musculoskeletal: Normal range of motion.  Neurological: She is alert and oriented to person, place, and time. She has normal strength. No cranial nerve deficit or sensory deficit. Coordination normal. GCS eye subscore is 4. GCS verbal subscore is 5. GCS motor subscore is 6.  Skin: Skin is warm, dry and intact. No rash noted. No cyanosis.  Psychiatric: She has a normal mood and affect. Her speech is normal and behavior is normal. Thought content normal.    ED Course  Procedures (including critical care time)  EKG:  Date: 12/21/2012  Rate: 174   Rhythm: atrial flutter  QRS Axis: normal  ST/T Wave abnormalities: nonspecific ST/T changes  Conduction Disutrbances:none  Narrative Interpretation:   Old EKG Reviewed: none available   Labs Review Labs Reviewed  CBC - Abnormal; Notable for the  following:    WBC 10.7 (*)    RBC 5.35 (*)    Hemoglobin 16.5 (*)    MCHC 36.4 (*)    All other components within normal limits  BASIC METABOLIC PANEL - Abnormal; Notable for the following:    Sodium 134 (*)    Potassium 2.7 (*)    Chloride 94 (*)    Glucose, Bld 345 (*)    All other components within normal limits  PRO B NATRIURETIC PEPTIDE  POCT I-STAT TROPONIN I   Imaging  Review Dg Chest Port 1 View  12/21/2012   CLINICAL DATA:  Heart palpitations. Shortness of breath. Cough and congestion.  EXAM: PORTABLE CHEST - 1 VIEW  COMPARISON:  No priors.  FINDINGS: Lung volumes are normal. No consolidative airspace disease. No pleural effusions. No pneumothorax. No pulmonary nodule or mass noted. Pulmonary vasculature and the cardiomediastinal silhouette are within normal limits.  IMPRESSION: 1.  No radiographic evidence of acute cardiopulmonary disease.   Electronically Signed   By: Trudie Reed M.D.   On: 12/21/2012 16:34    EKG Interpretation   None       MDM  Diagnosis: Supraventricular tachycardia, atrial fibrillation and atrial flutter  Patient presents to the ER for evaluation of heart palpitations. Patient has a supraventricular tachycardia which is intermittently irregular. It appears to be atrial fibrillation with a rapid ventricular response intermittently with atrial flutter. Patient was initiated on a Cardizem bolus and drip without any improvement. Cardizem was therefore discontinued and the patient started on amiodarone drip. She has not had any significant slowing over conversion. Because of this, Doctor Algie Coffer has been consulted. He recommends Lopressor IV and will see the patient in the ER for admission and further treatment.  CRITICAL CARE Performed by: Gilda Crease   Total critical care time: 30  Critical care time was exclusive of separately billable procedures and treating other patients.  Critical care was necessary to treat or prevent imminent or life-threatening deterioration.  Critical care was time spent personally by me on the following activities: development of treatment plan with patient and/or surrogate as well as nursing, discussions with consultants, evaluation of patient's response to treatment, examination of patient, obtaining history from patient or surrogate, ordering and performing treatments and interventions,  ordering and review of laboratory studies, ordering and review of radiographic studies, pulse oximetry and re-evaluation of patient's condition.   Gilda Crease, MD 12/21/12 1902  Gilda Crease, MD 12/22/12 [phone]

## 2012-12-21 NOTE — ED Notes (Signed)
Reports onset today of palpitations and high bp. Reports hx of tachycardia but did not follow up with cardio. HR 170 at triage.

## 2012-12-21 NOTE — ED Notes (Signed)
Attempt report X1 

## 2012-12-21 NOTE — ED Notes (Signed)
Cardiologist at bedside.  

## 2012-12-22 LAB — CBC
Hemoglobin: 14.7 g/dL (ref 12.0–15.0)
MCH: 31.1 pg (ref 26.0–34.0)
MCHC: 36.8 g/dL — ABNORMAL HIGH (ref 30.0–36.0)
MCV: 84.5 fL (ref 78.0–100.0)

## 2012-12-22 LAB — BASIC METABOLIC PANEL
BUN: 16 mg/dL (ref 6–23)
Calcium: 8.5 mg/dL (ref 8.4–10.5)
GFR calc non Af Amer: >90 mL/min (ref >90)
Glucose, Bld: 253 mg/dL — ABNORMAL HIGH (ref 70–99)

## 2012-12-22 LAB — TROPONIN I
Troponin I: <0.3 ng/mL (ref <0.30)
Troponin I: <0.3 ng/mL (ref <0.30)
Troponin I: <0.3 ng/mL (ref <0.30)

## 2012-12-22 LAB — HEMOGLOBIN A1C: Hgb A1c MFr Bld: 11.2 % — ABNORMAL HIGH (ref <5.7)

## 2012-12-22 LAB — GLUCOSE, CAPILLARY
Glucose-Capillary: 359 mg/dL — ABNORMAL HIGH (ref 70–99)
Glucose-Capillary: 271 mg/dL — ABNORMAL HIGH (ref 70–99)
Glucose-Capillary: 197 mg/dL — ABNORMAL HIGH (ref 70–99)

## 2012-12-22 MED ORDER — BD GETTING STARTED TAKE HOME KIT: 3/10ML X 30G SYRINGES
1.0000 | Freq: Once | Status: DC
  Filled 2012-12-22: qty 1

## 2012-12-22 MED ORDER — ASPIRIN EC 325 MG PO TBEC
325.0000 mg | DELAYED_RELEASE_TABLET | Freq: Every day | ORAL | Status: DC
  Administered 2012-12-22: 325 mg via ORAL
  Filled 2012-12-22: qty 1

## 2012-12-22 MED ORDER — ASPIRIN 325 MG PO TBEC: 325.0000 mg | DELAYED_RELEASE_TABLET | Freq: Every day | ORAL | Status: DC

## 2012-12-22 MED ORDER — AMIODARONE HCL 200 MG PO TABS
200.0000 mg | ORAL_TABLET | Freq: Two times a day (BID) | ORAL | Status: DC
  Administered 2012-12-22 (×2): 200 mg via ORAL
  Filled 2012-12-22 (×2): qty 1

## 2012-12-22 MED ORDER — POTASSIUM CHLORIDE CRYS ER 10 MEQ PO TBCR
10.0000 meq | EXTENDED_RELEASE_TABLET | Freq: Two times a day (BID) | ORAL | Status: DC
  Administered 2012-12-22: 10 meq via ORAL
  Filled 2012-12-22: qty 1

## 2012-12-22 MED ORDER — DILTIAZEM HCL 30 MG PO TABS
30.0000 mg | ORAL_TABLET | Freq: Four times a day (QID) | ORAL | Status: DC
  Administered 2012-12-22 (×2): 30 mg via ORAL
  Filled 2012-12-22 (×4): qty 1

## 2012-12-22 MED ORDER — AMIODARONE HCL 200 MG PO TABS: 200.0000 mg | ORAL_TABLET | Freq: Two times a day (BID) | ORAL | Status: DC

## 2012-12-22 MED ORDER — INSULIN GLARGINE 100 UNIT/ML ~~LOC~~ SOLN: 20.0000 [IU] | Freq: Every day | SUBCUTANEOUS | Status: DC

## 2012-12-22 MED ORDER — PNEUMOCOCCAL VAC POLYVALENT 25 MCG/0.5ML IJ INJ: 0.5000 mL | INJECTION | INTRAMUSCULAR | Status: DC

## 2012-12-22 MED ORDER — POTASSIUM CHLORIDE CRYS ER 20 MEQ PO TBCR
40.0000 meq | EXTENDED_RELEASE_TABLET | Freq: Once | ORAL | Status: AC
  Administered 2012-12-22: 40 meq via ORAL
  Filled 2012-12-22: qty 2

## 2012-12-22 MED ORDER — POTASSIUM CHLORIDE CRYS ER 10 MEQ PO TBCR: 10.0000 meq | EXTENDED_RELEASE_TABLET | Freq: Two times a day (BID) | ORAL | Status: DC

## 2012-12-22 MED ORDER — DILTIAZEM HCL 60 MG PO TABS: 60.0000 mg | ORAL_TABLET | Freq: Two times a day (BID) | ORAL | Status: DC

## 2012-12-22 MED ORDER — BD GETTING STARTED TAKE HOME KIT: 3/10ML X 30G SYRINGES: 1.0000 | Freq: Once | Status: DC

## 2012-12-22 MED ORDER — INFLUENZA VAC SPLIT QUAD 0.5 ML IM SUSP: 0.5000 mL | INTRAMUSCULAR | Status: DC

## 2012-12-22 MED ORDER — METOPROLOL TARTRATE 50 MG PO TABS: 50.0000 mg | ORAL_TABLET | Freq: Two times a day (BID) | ORAL | Status: DC

## 2012-12-22 MED ORDER — "BD GETTING STARTED TAKE HOME KIT: 1ML X 30 G SYRINGES, "
1.0000 | Freq: Once | Status: AC
  Administered 2012-12-22: 1
  Filled 2012-12-22: qty 1

## 2012-12-22 NOTE — Progress Notes (Signed)
Subjective:  Converted to sinus rhythm earlier today. No chest pain,  Objective:  Vital Signs in the last 24 hours: Temp:  [97.7 F (36.5 C)-99 F (37.2 C)] 98 F (36.7 C) (10/12 0700) Pulse Rate:  [70-170] 72 (10/12 0600) Cardiac Rhythm:  [-] Normal sinus rhythm (10/12 0600) Resp:  [8-22] 18 (10/12 0600) BP: (110-165)/(59-120) 128/86 mmHg (10/12 0600) SpO2:  [93 %-100 %] 94 % (10/12 0600) Weight:  [80.3 kg (177 lb 0.5 oz)-81.647 kg (180 lb)] 80.3 kg (177 lb 0.5 oz) (10/12 0500)  Physical Exam: BP Readings from Last 1 Encounters:  12/22/12 128/86     Wt Readings from Last 1 Encounters:  12/22/12 80.3 kg (177 lb 0.5 oz)    Weight change:   HEENT: [place]/AT, Eyes-PERL, EOMI, Conjunctiva-Pink, Sclera-Non-icteric Neck: No JVD, No bruit, Trachea midline. Lungs:  Clear, Bilateral. Cardiac:  Regular rhythm, normal S1 and S2, no S3.  Abdomen:  Soft, non-tender. Extremities:  No edema present. No cyanosis. No clubbing. CNS: AxOx3, Cranial nerves grossly intact, moves all 4 extremities. Right handed. Skin: Warm and dry.   Intake/Output from previous day: 10/11 0701 - 10/12 0700 In: 333.4 [I.V.:333.4] Out: 2700 [Urine:2700]    Lab Results: BMET    Component Value Date/Time   NA 134* 12/22/2012 0340   K 3.6 12/22/2012 0340   CL 99 12/22/2012 0340   CO2 22 12/22/2012 0340   GLUCOSE 253* 12/22/2012 0340   BUN 16 12/22/2012 0340   CREATININE 0.55 12/22/2012 0340   CALCIUM 8.5 12/22/2012 0340   GFRNONAA >90 12/22/2012 0340   GFRAA >90 12/22/2012 0340   CBC    Component Value Date/Time   WBC 11.0* 12/22/2012 0340   RBC 4.72 12/22/2012 0340   HGB 14.7 12/22/2012 0340   HCT 39.9 12/22/2012 0340   PLT 223 12/22/2012 0340   MCV 84.5 12/22/2012 0340   MCH 31.1 12/22/2012 0340   MCHC 36.8* 12/22/2012 0340   RDW 12.5 12/22/2012 0340   LYMPHSABS 3.4 09/23/2008 1312   MONOABS 0.6 09/23/2008 1312   EOSABS 0.3 09/23/2008 1312   BASOSABS 0.1 09/23/2008 1312   CARDIAC ENZYMES Lab  Results  Component Value Date   CKTOTAL 31 09/24/2008   CKMB 0.7 09/24/2008   TROPONINI <0.30 12/22/2012    Scheduled Meds: . docusate sodium  100 mg Oral BID  . heparin  5,000 Units Subcutaneous Q8H  . [START ON 12/23/2012] influenza vac split quadrivalent PF  0.5 mL Intramuscular Tomorrow-1000  . insulin aspart  0-15 Units Subcutaneous TID WC  . insulin aspart  4 Units Subcutaneous TID WC  . insulin glargine  10 Units Subcutaneous QHS  . metoprolol      . metoprolol tartrate  50 mg Oral BID  . multivitamin with minerals  1 tablet Oral Daily  . [START ON 12/23/2012] pneumococcal 23 valent vaccine  0.5 mL Intramuscular Tomorrow-1000  . sodium chloride  3 mL Intravenous Q12H   Continuous Infusions: . amiodarone (NEXTERONE PREMIX) 360 mg/200 mL dextrose 30 mg/hr (12/22/12 0600)  . diltiazem (CARDIZEM) infusion 5 mg/hr (12/22/12 0100)   PRN Meds:.acetaminophen, acetaminophen, alum & mag hydroxide-simeth, ondansetron (ZOFRAN) IV, ondansetron  Assessment/Plan: Atrial flutter with rapid ventricular response-Now SR  DM, II - Improving control Hypertension -Improved control Hypokalemia- Resolved  Continue medical treatment.   LOS: 1 day    Orpah Cobb  MD  12/22/2012, 8:20 AM

## 2012-12-22 NOTE — Progress Notes (Signed)
   CARE MANAGEMENT NOTE 12/22/2012  Patient:  Christy Abbott, Christy Abbott   Account Number:  [id number]  Date Initiated:  12/22/2012  Documentation initiated by:  [facility]  Subjective/Objective Assessment:   JXB:JYNWGN palpitations     Action/Plan:   discharge planning   Anticipated DC Date:  12/24/2012   Anticipated DC Plan:        DC Planning Services  CM consult      Choice offered to / List presented to:             Status of service:  In process, will continue to follow Medicare Important Message given?   (If response is "NO", the following Medicare IM given date fields will be blank) Date Medicare IM given:   Date Additional Medicare IM given:    Discharge Disposition:    Per UR Regulation:    If discussed at Long Length of Stay Meetings, dates discussed:    Comments:  12/22/12 09:02 CM met with pt in room to give her handout on [facility] and pt stated she has a PCP, Dr. Rosalio Macadamia but she has no insurance.  CM gave her [employer] [place] handout to help her get  insurance after discharge. [facility] will set up appt with clients (free of charge) to help them navigate the [employer].  Pt states she has no financial problem getting her meds as she takes advantage of the $4 plan at [facility].  If new prescriptions are given at discharge which are not affordable, MATCH is a possibility as she states she has not used this program before.  Will continue to follow for Medication aid if needed.  Freddy Jaksch, BSN, CM [phone].

## 2012-12-22 NOTE — Discharge Summary (Signed)
Physician Discharge Summary  [name] MRN: [id number] DOB/AGE: 57-Jan-1957 57 y.o.  Admit date: 12/21/2012 Discharge date: 12/22/2012  Admission Diagnoses: Atrial flutter with rapid ventricular response-Now SR  DM, II - Improving control  Hypertension -Improved control  Hypokalemia- Resolved  Discharge Diagnoses:  Principle Problem: * Atrial flutter with rapid ventricular response * DM, II  Hypertension   Hypokalemia   Discharged Condition: good  Hospital Course: 57 years old female with hypertension and diabetes had palpitation without significant caffeine or other stimulant intake. EKG showed atrial flutter with 2:1 AV conduction. She required few doses of digoxin, diltiazem, metoprolol and amiodarone to convert to sinus rhythm. She wants additional testing like TMST on OP basis. Lantus Insulin was used to improve blood sugar control.  Consults: None  Significant Diagnostic Studies: labs: Normal CBC and BMET except low potassium and elevated sugar levels.  Treatments: cardiac meds: lisinopril (Prinivil), metoprolol, diltiazem and amiodarone and insulin: Lantus  Discharge Exam: Blood pressure 151/93, pulse 76, temperature 98.7 F (37.1 C), temperature source Oral, resp. rate 12, height 5\' 4"  (1.626 m), weight 80.3 kg (177 lb 0.5 oz), SpO2 95.00%. General appearance: alert, cooperative and appears stated age  Head: Normocephalic, without obvious abnormality, atraumatic  Eyes: conjunctivae/corneas clear. PERRL, EOM's intact. Fundi benign.  Nose: Nares normal. Septum midline. Mucosa normal. No drainage or sinus tenderness.  Neck: no adenopathy, no carotid bruit, no JVD, supple, symmetrical, trachea midline and thyroid not enlarged, symmetric, no tenderness/mass/nodules  Resp: clear to auscultation bilaterally  Cardio: regular rate and rhythm, S1, S2 normal, no murmur, click, rub or gallop  GI: soft, non-tender; bowel sounds normal; no masses, no organomegaly   Extremities: extremities normal, atraumatic, no cyanosis or edema  Pulses: 2+ and symmetric  Skin: Skin color, texture, turgor normal. No rashes or lesions  Neurologic: Alert and oriented X 3, normal strength and tone. Normal symmetric reflexes. Normal coordination and gait  Disposition: 01, Home/Self care.     Medication List         amiodarone 200 MG tablet  Commonly known as:  PACERONE  Take 1 tablet (200 mg total) by mouth 2 (two) times daily.     aspirin 325 MG EC tablet  Take 1 tablet (325 mg total) by mouth daily.     diltiazem 60 MG tablet  Commonly known as:  CARDIZEM  Take 1 tablet (60 mg total) by mouth every 12 (twelve) hours.     glimepiride 4 MG tablet  Commonly known as:  AMARYL  Take 4 mg by mouth daily before breakfast.     insulin glargine 100 UNIT/ML injection  Commonly known as:  LANTUS  Inject 0.2 mLs (20 Units total) into the skin at bedtime.     lisinopril-hydrochlorothiazide 20-25 MG per tablet  Commonly known as:  PRINZIDE,ZESTORETIC  Take 1 tablet by mouth daily.     metoprolol 50 MG tablet  Commonly known as:  LOPRESSOR  Take 1 tablet (50 mg total) by mouth 2 (two) times daily.     potassium chloride 10 MEQ tablet  Commonly known as:  K-DUR,KLOR-CON  Take 1 tablet (10 mEq total) by mouth 2 (two) times daily.           Follow-up Information   Follow up with [facility] S, MD. Schedule an appointment as soon as possible for a visit in 1 week.   Specialty:  Cardiology   Contact information:   [address] [place] [address] [phone]  SignedOrpah Cobb S 12/22/2012, 4:19 PM

## 2012-12-22 NOTE — Progress Notes (Signed)
  Echocardiogram 2D Echocardiogram has been performed.  Arvil Chaco 12/22/2012, 2:55 PM

## 2012-12-22 NOTE — Progress Notes (Signed)
   CARE MANAGEMENT NOTE 12/22/2012  Patient:  Christy Abbott, Christy Abbott   Account Number:  [id number]  Date Initiated:  12/22/2012  Documentation initiated by:  [facility]  Subjective/Objective Assessment:   [id number] palpitations     Action/Plan:   discharge planning   Anticipated DC Date:  12/24/2012   Anticipated DC Plan:        DC Planning Services  CM consult  MATCH Program      Choice offered to / List presented to:             Status of service:  Completed, signed off Medicare Important Message given?   (If response is "NO", the following Medicare IM given date fields will be blank) Date Medicare IM given:   Date Additional Medicare IM given:    Discharge Disposition:  HOME/SELF CARE  Per UR Regulation:    If discussed at Long Length of Stay Meetings, dates discussed:    Comments:  12/22/12 16:54 Cm brought MATCH letter to pt and explained its use.  Pt states she understands. Pt works full time hours but not at a full-time job where she gets benefits. Pt's husband is disabled.  Pt strongly encouraged to call for an appt to get insurance.  Pt understands appt is free. No other CM needs were communicated.  Freddy Jaksch, BSN, [place] [phone].  12/22/12 09:02 CM met with pt in room to give her handout on [facility] and pt stated she has a PCP, Dr. Rosalio Macadamia but she has no insurance.  CM gave her [employer] [place] handout to help her get  insurance after discharge. [facility] will set up appt with clients (free of charge) to help them navigate the [employer].  Pt states she has no financial problem getting her meds as she takes advantage of the $4 plan at [facility].  If new prescriptions are given at discharge which are not affordable, MATCH is a possibility as she states she has not used this program before.  Will continue to follow for Medication aid if needed.  Freddy Jaksch, BSN, CM [phone].

## 2013-02-24 ENCOUNTER — Ambulatory Visit (INDEPENDENT_AMBULATORY_CARE_PROVIDER_SITE_OTHER): Payer: Self-pay | Admitting: Interventional Cardiology

## 2013-02-24 ENCOUNTER — Encounter: Payer: Self-pay | Admitting: Interventional Cardiology

## 2013-02-24 VITALS — BP 166/116 | HR 157 | Ht 64.0 in | Wt 164.1 lb

## 2013-02-24 DIAGNOSIS — I4892 Unspecified atrial flutter: Secondary | ICD-10-CM | POA: Insufficient documentation

## 2013-02-24 DIAGNOSIS — I10 Essential (primary) hypertension: Secondary | ICD-10-CM

## 2013-02-24 DIAGNOSIS — R5381 Other malaise: Secondary | ICD-10-CM

## 2013-02-24 MED ORDER — DILTIAZEM HCL ER COATED BEADS 240 MG PO CP24: 240.0000 mg | ORAL_CAPSULE | Freq: Every day | ORAL | Status: DC

## 2013-02-24 MED ORDER — RIVAROXABAN 20 MG PO TABS: 20.0000 mg | ORAL_TABLET | Freq: Every day | ORAL | Status: DC

## 2013-02-24 NOTE — Progress Notes (Signed)
[name], female   DOB: February 04, 1956, 57 y.o.   MRN: [id number]     [name] MRN: [id number] DOB/AGE: 23-Dec-1955 57 y.o.   Referring Physician Dr. Clovis Riley   Reason for Consultation palpitations  HPI: 57 y/o who has had symptomatic atrial flutter.  She was hospitalized in October for atrial flutter. She spontaneously converted to normal sinus rhythm. She received digoxin, diltiazem, metoprolol and subsequently amiodarone. All this helped her converted. She has been maintained only on aspirin for stroke prevention.  She had been doing well until Saturday. She felt more tired then usual. When she would check her pulse, she noted that it was quite rapid. She checked the blood pressure monitor and it was over 150. She comes in today for further evaluation.  Of note, I take care of this patient's mother as well. Her mother had informed me of the patient's cardiac issues. She had been waiting to get insurance before being seen.   Current Outpatient Prescriptions  Medication Sig Dispense Refill  . amiodarone (PACERONE) 200 MG tablet Take 1 tablet (200 mg total) by mouth 2 (two) times daily.  60 tablet  1  . aspirin EC 325 MG EC tablet Take 1 tablet (325 mg total) by mouth daily.  30 tablet  0  . bd getting started take home kit MISC 1 kit by Other route once.  1 kit  0  . bd getting started take home kit MISC 1 kit by Other route once.  1 kit  0  . diltiazem (CARDIZEM) 60 MG tablet Take 1 tablet (60 mg total) by mouth every 12 (twelve) hours.  60 tablet  1  . glimepiride (AMARYL) 4 MG tablet Take 4 mg by mouth daily before breakfast.      . insulin glargine (LANTUS) 100 UNIT/ML injection Inject 0.2 mLs (20 Units total) into the skin at bedtime.  10 mL  2  . lisinopril-hydrochlorothiazide (PRINZIDE,ZESTORETIC) 20-25 MG per tablet Take 1 tablet by mouth daily.      . metoprolol (LOPRESSOR) 50 MG tablet Take 1 tablet (50 mg total) by mouth 2 (two) times daily.  60  tablet  2  . potassium chloride (K-DUR,KLOR-CON) 10 MEQ tablet Take 1 tablet (10 mEq total) by mouth 2 (two) times daily.  60 tablet  2   No current facility-administered medications for this visit.   Past Medical History  Diagnosis Date  . Hypertension   . Diabetes mellitus without complication     Family History  Problem Relation Age of Onset  . Atrial fibrillation Mother   . Arrhythmia Mother   . Diabetes Mother   . Hodgkin's lymphoma Father     History   Social History  . Marital Status: Married    Spouse Name: N/A    Number of Children: N/A  . Years of Education: N/A   Occupational History  . Not on file.   Social History Main Topics  . Smoking status: Former Games developer  . Smokeless tobacco: Not on file  . Alcohol Use: No  . Drug Use: No  . Sexual Activity: Not on file   Other Topics Concern  . Not on file   Social History Narrative  . No narrative on file    No past surgical history on file.    (Not in a hospital admission)  Review of systems complete and found to be negative unless listed above .  No nausea, vomiting.  No fever chills, No focal  weakness,  No palpitations.  Physical Exam: Filed Vitals:   02/24/13 1531  BP: 166/116  Pulse: 157    Weight: 164 lb 1.9 oz (74.444 kg)  Physical exam:  /AT EOMI No JVD, No carotid bruit Tachycardic S1S2  No wheezing Soft. NT, nondistended No edema. No focal motor or sensory deficits Normal affect  Labs:   Lab Results  Component Value Date   WBC 11.0* 12/22/2012   HGB 14.7 12/22/2012   HCT 39.9 12/22/2012   MCV 84.5 12/22/2012   PLT 223 12/22/2012   No results found for this basename: NA, K, CL, CO2, BUN, CREATININE, CALCIUM, LABALBU, PROT, BILITOT, ALKPHOS, ALT, AST, GLUCOSE,  in the last 168 hours Lab Results  Component Value Date   CKTOTAL 31 09/24/2008   CKMB 0.7 09/24/2008   TROPONINI <0.30 12/22/2012    No results found for this basename: CHOL   No results found for this basename: HDL     No results found for this basename: LDLCALC   No results found for this basename: TRIG   No results found for this basename: CHOLHDL   No results found for this basename: LDLDIRECT      Radiology: Echo in October showed normal left jugular function with normal valvular function. EKG: Atrial flutter with 2:1 conduction  ASSESSMENT AND PLAN:  Atrial flutter: Symptomatic. Difficult to control heart rate. Start Xarelto, plan TEE/ablation.  I discussed this with Dr. Johney Frame. He is in agreement. We will also increase the dosage of her Cardizem. Will start Cardizem CD 240 mg daily. This should also help with her hypertension.  The patient will come back tomorrow and see Dr. Ladona Ridgel. A TEE/ ablation will be scheduled for next Tuesday.  The patient will also meet with one of the pharmacist to talk about her new anticoagulant.  Certainly long term, the plan would be to not have her on amiodarone. She likely would not need to AV nodal blocking agents. I reviewed all of her ECGs from the computer system. There is no evidence of atrial fibrillation.  Fatigue: Likely related to her high heart rate. She gets tired easily with minimal activity.    Hypertension: Start longer acting Cardizem. She forgot her dosage of medicine today. Long term, we may change our choice of antihypertensives once her atrial flutter has been resolved. Signed:   Fredric Mare, MD, [facility] 02/24/2013, 3:51 PM

## 2013-02-24 NOTE — Patient Instructions (Signed)
Your physician has recommended you make the following change in your medication:   1. Stop Diltiazem 60 mg.  2. Start Diltiazem 240 mg daily.  3. Start Xarelto 20 mg daily.  Your physician recommends that you follow up with EP tomorrow at 1:45 pm with Dr. Johney Frame.

## 2013-02-25 ENCOUNTER — Encounter: Payer: Self-pay | Admitting: Internal Medicine

## 2013-02-25 ENCOUNTER — Encounter: Payer: Self-pay | Admitting: *Deleted

## 2013-02-25 ENCOUNTER — Encounter (HOSPITAL_COMMUNITY): Payer: Self-pay | Admitting: Pharmacy Technician

## 2013-02-25 ENCOUNTER — Ambulatory Visit (INDEPENDENT_AMBULATORY_CARE_PROVIDER_SITE_OTHER): Payer: Self-pay | Admitting: Internal Medicine

## 2013-02-25 VITALS — BP 148/90 | HR 99 | Ht 64.0 in | Wt 162.1 lb

## 2013-02-25 DIAGNOSIS — I4892 Unspecified atrial flutter: Secondary | ICD-10-CM

## 2013-02-25 DIAGNOSIS — I10 Essential (primary) hypertension: Secondary | ICD-10-CM

## 2013-02-25 LAB — CBC WITH DIFFERENTIAL/PLATELET
Basophils Absolute: 0 10*3/uL (ref 0.0–0.1)
Basophils Relative: 0.3 % (ref 0.0–3.0)
Eosinophils Relative: 0.6 % (ref 0.0–5.0)
HCT: 46.4 % — ABNORMAL HIGH (ref 36.0–46.0)
Hemoglobin: 16.1 g/dL — ABNORMAL HIGH (ref 12.0–15.0)
Lymphocytes Relative: 34.6 % (ref 12.0–46.0)
Lymphs Abs: 3.7 10*3/uL (ref 0.7–4.0)
Monocytes Absolute: 0.6 10*3/uL (ref 0.1–1.0)
Monocytes Relative: 6 % (ref 3.0–12.0)
Neutrophils Relative %: 58.5 % (ref 43.0–77.0)
Platelets: 213 10*3/uL (ref 150.0–400.0)
RDW: 12.6 % (ref 11.5–14.6)
WBC: 10.6 10*3/uL — ABNORMAL HIGH (ref 4.5–10.5)

## 2013-02-25 NOTE — Assessment & Plan Note (Signed)
I discussed the treatment options with the patient in detail. The risk, goals, benefits, and expectations of catheter ablation and electrophysiology study have been discussed. She will need a transesophageal echo prior to her procedure. She'll continue her current medical therapy for now, and I would anticipate discontinuation of this medication following catheter ablation.

## 2013-02-25 NOTE — Progress Notes (Signed)
    HPI Christy Abbott is referred today by Dr. Allred for consideration for atrial flutter ablation. She is a very pleasant 57-year-old woman who presented initially to months ago with atrial flutter. She had a rapid ventricular response and was treated with multiple medications before reverting to sinus rhythm. She reports that 2 days ago with recurrent atrial flutter. She thinks her symptoms began on Saturday, 3 days ago. She was placed on anticoagulation. Despite her very rapid ventricular response, she is for the most part asymptomatic. She notes very minimal shortness of breath with exertion. She denies syncope, chest pain, or shortness of breath. No palpitations.she checks her blood pressure and heart rate regularly, and notes that her ventricular rate has improved since initiation of calcium channel blockers beta blockers and amiodarone. No Known Allergies   Current Outpatient Prescriptions  Medication Sig Dispense Refill  . amiodarone (PACERONE) 200 MG tablet Take 1 tablet (200 mg total) by mouth 2 (two) times daily.  60 tablet  1  . aspirin EC 325 MG EC tablet Take 1 tablet (325 mg total) by mouth daily.  30 tablet  0  . bd getting started take home kit MISC 1 kit by Other route once.  1 kit  0  . diltiazem (CARDIZEM CD) 240 MG 24 hr capsule Take 1 capsule (240 mg total) by mouth daily.  30 capsule  6  . glimepiride (AMARYL) 4 MG tablet Take 4 mg by mouth daily before breakfast.      . lisinopril-hydrochlorothiazide (PRINZIDE,ZESTORETIC) 20-25 MG per tablet Take 1 tablet by mouth daily.      . metoprolol (LOPRESSOR) 50 MG tablet Take 1 tablet (50 mg total) by mouth 2 (two) times daily.  60 tablet  2  . potassium chloride (K-DUR,KLOR-CON) 10 MEQ tablet Take 1 tablet (10 mEq total) by mouth 2 (two) times daily.  60 tablet  2  . Rivaroxaban (XARELTO) 20 MG TABS tablet Take 1 tablet (20 mg total) by mouth daily with supper.  30 tablet  6  . Bioflavonoid Products (ESTER C PO) Take 1 tablet by  mouth daily.      . Calcium Carbonate-Vitamin D (CALTRATE 600+D PO) Take 1 tablet by mouth 2 (two) times daily.      . Omega-3 Fatty Acids (FISH OIL) 1200 MG CAPS Take 1,200 mg by mouth daily.        No current facility-administered medications for this visit.     Past Medical History  Diagnosis Date  . Hypertension   . Diabetes mellitus without complication   . Chest pain   . Atrial flutter     ROS:   All systems reviewed and negative except as noted in the HPI.   History reviewed. No pertinent past surgical history.   Family History  Problem Relation Age of Onset  . Atrial fibrillation Mother   . Arrhythmia Mother   . Diabetes Mother   . Hodgkin's lymphoma Father      History   Social History  . Marital Status: Married    Spouse Name: N/A    Number of Children: N/A  . Years of Education: N/A   Occupational History  . Not on file.   Social History Main Topics  . Smoking status: Former Smoker  . Smokeless tobacco: Not on file  . Alcohol Use: No  . Drug Use: No  . Sexual Activity: Not on file   Other Topics Concern  . Not on file   Social History Narrative  .   No narrative on file     BP 148/90  Pulse 99  Ht 5' 4" (1.626 m)  Wt 162 lb 1.9 oz (73.537 kg)  BMI 27.81 kg/m2  Physical Exam:  Well appearing middle-age woman,NAD HEENT: Unremarkable Neck:  No JVD, no thyromegally Back:  No CVA tenderness Lungs:  Clear with no wheezes, rales, or rhonchi. HEART:  IRegular rate rhythm, no murmurs, no rubs, no clicks Abd:  soft, positive bowel sounds, no organomegally, no rebound, no guarding Ext:  2 plus pulses, no edema, no cyanosis, no clubbing Skin:  No rashes no nodules Neuro:  CN II through XII intact, motor grossly intact  EKG - atrial flutter with a rapid ventricular response, clockwise appearing with negative flutter wave in lead V1 and positive flutter wave in the inferior leads.  Assess/Plan: 

## 2013-02-25 NOTE — Assessment & Plan Note (Signed)
Her blood pressure is slightly elevated today. She will maintain a low-sodium diet.

## 2013-02-25 NOTE — Patient Instructions (Signed)
Your physician has recommended that you have an ablation. Catheter ablation is a medical procedure used to treat some cardiac arrhythmias (irregular heartbeats). During catheter ablation, a long, thin, flexible tube is put into a blood vessel in your groin (upper thigh), or neck. This tube is called an ablation catheter. It is then guided to your heart through the blood vessel. Radio frequency waves destroy small areas of heart tissue where abnormal heartbeats may cause an arrhythmia to start. Please see the instruction sheet given to you today.  Your physician has requested that you have a TEE. During a TEE, sound waves are used to create images of your heart. It provides your doctor with information about the size and shape of your heart and how well your heart's chambers and valves are working. In this test, a transducer is attached to the end of a flexible tube that's guided down your throat and into your esophagus (the tube leading from you mouth to your stomach) to get a more detailed image of your heart. You are not awake for the procedure. Please see the instruction sheet given to you today. For further information please visit [email or web address]

## 2013-02-26 LAB — BASIC METABOLIC PANEL
BUN: 30 mg/dL — ABNORMAL HIGH (ref 6–23)
Calcium: 9.4 mg/dL (ref 8.4–10.5)
Creatinine, Ser: 0.9 mg/dL (ref 0.4–1.2)
GFR: 73.11 mL/min (ref >60.00)
Glucose, Bld: 248 mg/dL — ABNORMAL HIGH (ref 70–99)

## 2013-02-28 ENCOUNTER — Other Ambulatory Visit: Payer: Self-pay | Admitting: *Deleted

## 2013-03-04 ENCOUNTER — Ambulatory Visit (HOSPITAL_COMMUNITY)
Admission: RE | Admit: 2013-03-04 | Discharge: 2013-03-04 | Disposition: A | Discharge status: Home / Self Care | Payer: Self-pay | Source: Ambulatory Visit | Attending: Internal Medicine | Admitting: Internal Medicine

## 2013-03-04 ENCOUNTER — Encounter (HOSPITAL_COMMUNITY)
Admission: RE | Disposition: A | Discharge status: Home / Self Care | Payer: Self-pay | Source: Ambulatory Visit | Attending: Internal Medicine

## 2013-03-04 ENCOUNTER — Encounter (HOSPITAL_COMMUNITY): Payer: Self-pay

## 2013-03-04 DIAGNOSIS — Z87891 Personal history of nicotine dependence: Secondary | ICD-10-CM | POA: Insufficient documentation

## 2013-03-04 DIAGNOSIS — E119 Type 2 diabetes mellitus without complications: Secondary | ICD-10-CM | POA: Insufficient documentation

## 2013-03-04 DIAGNOSIS — Z7982 Long term (current) use of aspirin: Secondary | ICD-10-CM | POA: Insufficient documentation

## 2013-03-04 DIAGNOSIS — Z7901 Long term (current) use of anticoagulants: Secondary | ICD-10-CM | POA: Insufficient documentation

## 2013-03-04 DIAGNOSIS — I10 Essential (primary) hypertension: Secondary | ICD-10-CM | POA: Insufficient documentation

## 2013-03-04 DIAGNOSIS — I4892 Unspecified atrial flutter: Secondary | ICD-10-CM | POA: Diagnosis present

## 2013-03-04 HISTORY — PX: ATRIAL FLUTTER ABLATION: SHX5733

## 2013-03-04 HISTORY — PX: ABLATION: SHX5711

## 2013-03-04 LAB — GLUCOSE, CAPILLARY

## 2013-03-04 SURGERY — CANCELLED PROCEDURE

## 2013-03-04 SURGERY — ATRIAL FLUTTER ABLATION
Anesthesia: LOCAL

## 2013-03-04 MED ORDER — POTASSIUM CHLORIDE CRYS ER 10 MEQ PO TBCR
10.0000 meq | EXTENDED_RELEASE_TABLET | Freq: Two times a day (BID) | ORAL | Status: DC
  Filled 2013-03-04: qty 1

## 2013-03-04 MED ORDER — AMIODARONE HCL 200 MG PO TABS
200.0000 mg | ORAL_TABLET | Freq: Two times a day (BID) | ORAL | Status: DC
  Filled 2013-03-04: qty 1

## 2013-03-04 MED ORDER — BUPIVACAINE HCL (PF) 0.25 % IJ SOLN
INTRAMUSCULAR | Status: AC
  Filled 2013-03-04: qty 30

## 2013-03-04 MED ORDER — GLIMEPIRIDE 4 MG PO TABS
4.0000 mg | ORAL_TABLET | Freq: Every day | ORAL | Status: DC
  Filled 2013-03-04: qty 1

## 2013-03-04 MED ORDER — RIVAROXABAN 20 MG PO TABS
20.0000 mg | ORAL_TABLET | Freq: Every day | ORAL | Status: DC
  Administered 2013-03-04: 20 mg via ORAL
  Filled 2013-03-04: qty 1

## 2013-03-04 MED ORDER — MIDAZOLAM HCL 5 MG/ML IJ SOLN
INTRAMUSCULAR | Status: AC
  Filled 2013-03-04: qty 2

## 2013-03-04 MED ORDER — ACETAMINOPHEN 325 MG PO TABS: 650.0000 mg | ORAL_TABLET | ORAL | Status: DC | PRN

## 2013-03-04 MED ORDER — FENTANYL CITRATE 0.05 MG/ML IJ SOLN
INTRAMUSCULAR | Status: AC
  Filled 2013-03-04: qty 2

## 2013-03-04 MED ORDER — MIDAZOLAM HCL 5 MG/5ML IJ SOLN
INTRAMUSCULAR | Status: AC
  Filled 2013-03-04: qty 5

## 2013-03-04 MED ORDER — SODIUM CHLORIDE 0.9 % IV SOLN: 250.0000 mL | INTRAVENOUS | Status: DC | PRN

## 2013-03-04 MED ORDER — METOPROLOL TARTRATE 50 MG PO TABS: 25.0000 mg | ORAL_TABLET | Freq: Two times a day (BID) | ORAL | Status: DC

## 2013-03-04 MED ORDER — BD GETTING STARTED TAKE HOME KIT: 3/10ML X 30G SYRINGES
1.0000 | Freq: Once | Status: DC
  Filled 2013-03-04: qty 1

## 2013-03-04 MED ORDER — SODIUM CHLORIDE 0.9 % IV SOLN
INTRAVENOUS | Status: DC
  Administered 2013-03-04: 09:00:00 via INTRAVENOUS

## 2013-03-04 MED ORDER — METOPROLOL TARTRATE 50 MG PO TABS
50.0000 mg | ORAL_TABLET | Freq: Two times a day (BID) | ORAL | Status: DC
  Filled 2013-03-04: qty 1

## 2013-03-04 MED ORDER — HEPARIN (PORCINE) IN NACL 2-0.9 UNIT/ML-% IJ SOLN
INTRAMUSCULAR | Status: AC
Start: 2013-03-04 — End: 2013-03-04
  Filled 2013-03-04: qty 500

## 2013-03-04 MED ORDER — ONDANSETRON HCL 4 MG/2ML IJ SOLN: 4.0000 mg | Freq: Four times a day (QID) | INTRAMUSCULAR | Status: DC | PRN

## 2013-03-04 MED ORDER — ASPIRIN EC 325 MG PO TBEC: 325.0000 mg | DELAYED_RELEASE_TABLET | Freq: Every day | ORAL | Status: DC

## 2013-03-04 MED ORDER — DILTIAZEM HCL ER COATED BEADS 240 MG PO CP24: 240.0000 mg | ORAL_CAPSULE | Freq: Every day | ORAL | Status: DC

## 2013-03-04 MED ORDER — SODIUM CHLORIDE 0.9 % IJ SOLN: 3.0000 mL | Freq: Two times a day (BID) | INTRAMUSCULAR | Status: DC

## 2013-03-04 MED ORDER — SODIUM CHLORIDE 0.9 % IJ SOLN: 3.0000 mL | INTRAMUSCULAR | Status: DC | PRN

## 2013-03-04 NOTE — Interval H&P Note (Signed)
History and Physical Interval Note: Patient seen and examined. Agree with above history, physical exam, assessment and plan. 03/04/2013 7:18 AM  Christy Abbott  has presented today for surgery, with the diagnosis of aflutter  The various methods of treatment have been discussed with the patient and family. After consideration of risks, benefits and other options for treatment, the patient has consented to  Procedure(s): ATRIAL FLUTTER ABLATION (N/A) as a surgical intervention .  The patient's history has been reviewed, patient examined, no change in status, stable for surgery.  I have reviewed the patient's chart and labs.  Questions were answered to the patient's satisfaction.     Leonia Reeves.D.

## 2013-03-04 NOTE — H&P (View-Only) (Signed)
HPI Christy Abbott is referred today by Dr. Johney Frame for consideration for atrial flutter ablation. She is a very pleasant 57 year old woman who presented initially to months ago with atrial flutter. She had a rapid ventricular response and was treated with multiple medications before reverting to sinus rhythm. She reports that 2 days ago with recurrent atrial flutter. She thinks her symptoms began on Saturday, 3 days ago. She was placed on anticoagulation. Despite her very rapid ventricular response, she is for the most part asymptomatic. She notes very minimal shortness of breath with exertion. She denies syncope, chest pain, or shortness of breath. No palpitations.she checks her blood pressure and heart rate regularly, and notes that her ventricular rate has improved since initiation of calcium channel blockers beta blockers and amiodarone. No Known Allergies   Current Outpatient Prescriptions  Medication Sig Dispense Refill  . amiodarone (PACERONE) 200 MG tablet Take 1 tablet (200 mg total) by mouth 2 (two) times daily.  60 tablet  1  . aspirin EC 325 MG EC tablet Take 1 tablet (325 mg total) by mouth daily.  30 tablet  0  . bd getting started take home kit MISC 1 kit by Other route once.  1 kit  0  . diltiazem (CARDIZEM CD) 240 MG 24 hr capsule Take 1 capsule (240 mg total) by mouth daily.  30 capsule  6  . glimepiride (AMARYL) 4 MG tablet Take 4 mg by mouth daily before breakfast.      . lisinopril-hydrochlorothiazide (PRINZIDE,ZESTORETIC) 20-25 MG per tablet Take 1 tablet by mouth daily.      . metoprolol (LOPRESSOR) 50 MG tablet Take 1 tablet (50 mg total) by mouth 2 (two) times daily.  60 tablet  2  . potassium chloride (K-DUR,KLOR-CON) 10 MEQ tablet Take 1 tablet (10 mEq total) by mouth 2 (two) times daily.  60 tablet  2  . Rivaroxaban (XARELTO) 20 MG TABS tablet Take 1 tablet (20 mg total) by mouth daily with supper.  30 tablet  6  . Bioflavonoid Products (ESTER C PO) Take 1 tablet by  mouth daily.      . Calcium Carbonate-Vitamin D (CALTRATE 600+D PO) Take 1 tablet by mouth 2 (two) times daily.      . Omega-3 Fatty Acids (FISH OIL) 1200 MG CAPS Take 1,200 mg by mouth daily.        No current facility-administered medications for this visit.     Past Medical History  Diagnosis Date  . Hypertension   . Diabetes mellitus without complication   . Chest pain   . Atrial flutter     ROS:   All systems reviewed and negative except as noted in the HPI.   History reviewed. No pertinent past surgical history.   Family History  Problem Relation Age of Onset  . Atrial fibrillation Mother   . Arrhythmia Mother   . Diabetes Mother   . Hodgkin's lymphoma Father      History   Social History  . Marital Status: Married    Spouse Name: N/A    Number of Children: N/A  . Years of Education: N/A   Occupational History  . Not on file.   Social History Main Topics  . Smoking status: Former Games developer  . Smokeless tobacco: Not on file  . Alcohol Use: No  . Drug Use: No  . Sexual Activity: Not on file   Other Topics Concern  . Not on file   Social History Narrative  .  No narrative on file     BP 148/90  Pulse 99  Ht 5\' 4"  (1.626 m)  Wt 162 lb 1.9 oz (73.537 kg)  BMI 27.81 kg/m2  Physical Exam:  Well appearing middle-age woman,NAD HEENT: Unremarkable Neck:  No JVD, no thyromegally Back:  No CVA tenderness Lungs:  Clear with no wheezes, rales, or rhonchi. HEART:  IRegular rate rhythm, no murmurs, no rubs, no clicks Abd:  soft, positive bowel sounds, no organomegally, no rebound, no guarding Ext:  2 plus pulses, no edema, no cyanosis, no clubbing Skin:  No rashes no nodules Neuro:  CN II through XII intact, motor grossly intact  EKG - atrial flutter with a rapid ventricular response, clockwise appearing with negative flutter wave in lead V1 and positive flutter wave in the inferior leads.  Assess/Plan:

## 2013-03-04 NOTE — CV Procedure (Signed)
EPS/RFA of atrial flutter without immediate complication. [id number].

## 2013-03-04 NOTE — Interval H&P Note (Signed)
History and Physical Interval Note:  03/04/2013 9:17 AM  Christy Abbott  has presented today for surgery, with the diagnosis of aflutter  The various methods of treatment have been discussed with the patient and family. After consideration of risks, benefits and other options for treatment, the patient has consented to  Procedure(s): ATRIAL FLUTTER ABLATION (N/A) as a surgical intervention .  The patient's history has been reviewed, patient examined, no change in status, stable for surgery.  I have reviewed the patient's chart and labs.  Questions were answered to the patient's satisfaction.     Lewayne Bunting, M.D.

## 2013-03-04 NOTE — Discharge Summary (Signed)
ELECTROPHYSIOLOGY PROCEDURE DISCHARGE SUMMARY    [name],  MRN: [id number], DOB/AGE: Jan 15, 1956 57 y.o.  Admit date: 03/04/2013 Discharge date: 03/05/2013  Primary Care Physician: Lupe Carney, MD Primary Cardiologist: Everette Rank, MD Electrophysiologist: Lewayne Bunting, MD  Primary Discharge Diagnosis:  Atrial flutter status post ablation this admission  Secondary Discharge Diagnosis:  1.  Hypertension 2.  Diabetes  No Known Allergies   Procedures This Admission:  1.  Electrophysiology study and radiofrequency catheter ablation of atrial flutter on 03-04-2013 by Dr Ladona Ridgel.  This study demonstrated typical atrial flutter with complete bidirectional isthmus block achieved.  There were no early apparent complications  Brief HPI: Christy Abbott is a 57 year old female with a past medical history significant for hypertension and diabetes.  She was found to have atrial flutter earlier this year and was treated with digoxin, diltiazem, metoprolol, and amiodarone which resulted in return to sinus rhythm.  She then developed fatigue and tachycardia and was evaluated in the office for treatment options.  Risks, benefits, and alternatives to ablation were reviewed with the patient who wished to proceed.   Hospital Course:  The patient was admitted and underwent electrophysiology study and catheter ablation of atrial flutter with details as outlined above. She was in SR on presentation and did not require TEE.  She was monitored on telemetry during her bedrest which demonstrated SR.  After her bedrest was complete, she was ambulating without difficulty and considered stable for discharge to home.  She will follow up in the office in 4 weeks with Rick Duff, PA.  Her Amiodarone will be discontinued at discharge and her Lopressor will be decreased due to successful ablation this admission. Her Lopressor and Diltiazem should be weaned as an outpatient as tolerated.    Discharge Vitals: Blood pressure 119/60, pulse 66, temperature 97.9 F (36.6 C), temperature source Oral, resp. rate 13, SpO2 97.00%.   Labs:   Lab Results  Component Value Date   WBC 10.6* 02/25/2013   HGB 16.1* 02/25/2013   HCT 46.4* 02/25/2013   MCV 87.0 02/25/2013   PLT 213.0 02/25/2013   No results found for this basename: NA, K, CL, CO2, BUN, CREATININE, CALCIUM, LABALBU, PROT, BILITOT, ALKPHOS, ALT, AST, GLUCOSE,  in the last 168 hours   Discharge Medications:    Medication List    STOP taking these medications       amiodarone 200 MG tablet  Commonly known as:  PACERONE      TAKE these medications       aspirin 325 MG EC tablet  Take 1 tablet (325 mg total) by mouth daily.     bd getting started take home kit Misc  1 kit by Other route once.     CALTRATE 600+D PO  Take 1 tablet by mouth 2 (two) times daily.     diltiazem 240 MG 24 hr capsule  Commonly known as:  CARDIZEM CD  Take 1 capsule (240 mg total) by mouth daily.     ESTER C PO  Take 1 tablet by mouth daily.     Fish Oil 1200 MG Caps  Take 1,200 mg by mouth daily.     glimepiride 4 MG tablet  Commonly known as:  AMARYL  Take 4 mg by mouth daily before breakfast.     lisinopril-hydrochlorothiazide 20-25 MG per tablet  Commonly known as:  PRINZIDE,ZESTORETIC  Take 1 tablet by mouth daily.     metoprolol 50 MG tablet  Commonly  known as:  LOPRESSOR  Take 0.5 tablets (25 mg total) by mouth 2 (two) times daily.     potassium chloride 10 MEQ tablet  Commonly known as:  K-DUR,KLOR-CON  Take 1 tablet (10 mEq total) by mouth 2 (two) times daily.     Rivaroxaban 20 MG Tabs tablet  Commonly known as:  XARELTO  Take 1 tablet (20 mg total) by mouth daily with supper.        Disposition:   Future Appointments Provider Department Dept Phone   04/01/2013 8:30 AM Minda Meo, PA-C [facility] [place] Office [phone]     Follow-up Information   Follow up with Rick Duff,  PA-C On 04/01/2013. (8:30 AM)    Specialty:  Cardiology   Contact information:   [address] [place] [address] [phone]       Follow up with Lupe Carney, MD. (as scheduled.)    Specialty:  Family Medicine   Contact information:   [address] [place] [address] [phone]       Duration of Discharge Encounter: Greater than 30 minutes including physician time.

## 2013-03-04 NOTE — Interval H&P Note (Signed)
History and Physical Interval Note:  03/04/2013 9:17 AM  Christy Abbott  has presented today for surgery, with the diagnosis of aflutter  The various methods of treatment have been discussed with the patient and family. After consideration of risks, benefits and other options for treatment, the patient has consented to  Procedure(s): ATRIAL FLUTTER ABLATION (N/A) as a surgical intervention .  The patient's history has been reviewed, patient examined, no change in status, stable for surgery.  I have reviewed the patient's chart and labs.  Questions were answered to the patient's satisfaction.     Lewayne Bunting

## 2013-03-04 NOTE — Op Note (Signed)
[name], [name]:  [id number]  MEDICAL RECORD NO.:  [id number]  LOCATION:  3W28C                        FACILITY:  [facility]  PHYSICIAN:  Doylene Canning. Ladona Ridgel, MD    DATE OF BIRTH:  1955/08/23  DATE OF PROCEDURE:  03/04/2013 DATE OF DISCHARGE:  03/04/2013                              OPERATIVE REPORT   PROCEDURE PERFORMED:  Electrophysiologic study and RF catheter ablation of atrial flutter.  INDICATION:  Symptomatic typical atrial flutter.  INTRODUCTION: 1. The patient is a very pleasant 57 year old woman who has recurrent     documented atrial flutter associated with shortness of breath.  She     is now referred for a catheter ablation. 2. The patient has been anticoagulated with Xarelto.  PROCEDURE:  After informed consent was obtained, the patient was taken to the diagnostic EP lab in a fasting state.  After usual preparation and draping, intravenous fentanyl and midazolam was given for sedation. A 36-French hexapolar catheter was inserted percutaneously into the right jugular vein and advanced into the coronary sinus.  A 7-French 20 pole halo catheter was inserted percutaneously in the right femoral vein and advanced to the right atrium.  A 6-French quadripolar catheter was inserted percutaneously in the right femoral vein and advanced to the His bundle region.  After measuring the basic intervals, rapid atrial pacing was carried out from the coronary sinus at a pacing cycle length of 590 milliseconds and stepwise decreased down to 490 milliseconds, where AV Wenckebach was seen.  During rapid atrial pacing, the PR interval was less than the RR interval and there was no inducible SVT. Next, programmed atrial stimulation was carried out from the atrium at a base drive cycle length of 161 milliseconds.  The S1-S2 interval stepwise decreased down to 350 milliseconds, where the AV node ERP was observed.  During programmed atrial stimulation, there were no AH  jumps, no echo beats, no inducible SVT.  Next, rapid ventricular pacing was carried out demonstrating VA dissociation at 600 milliseconds. Programmed ventricular stimulation was then carried out demonstrating VA dissociation at 600 milliseconds.  At this point, additional rapid atrial pacing was carried out at 290 milliseconds in a stepwise decreased down to 210 milliseconds resulting in the initiation of atrial flutter.  This was typical counterclockwise atrial flutter.  The cycle length was 260 milliseconds.  The patient's flutter would last several minutes and then terminated spontaneously.  The halo catheter demonstrated typical counterclockwise tricuspid annular reentry.  A 7- French quadripolar ablation catheter was then inserted percutaneously into the right femoral vein and advanced into the region of the tricuspid valve annulus.  At a position between 5 o'clock and 7 o'clock in the LAO projection, a total of 9 RF energy applications were delivered to the region between the tricuspid valve annulus and the inferior vena cava.  During RF energy application, there was development of atrial flutter isthmus block on the 6th RF energy application, and 3 Bonus RF energy applications were subsequently delivered.  The patient was observed for 30 minutes and during this time, additional pacing was carried out from the right ventricle demonstrating no inducible arrhythmias.  The stimulus to A time locally at  the ablation catheter with demonstration of atrial flutter isthmus block was 160 milliseconds. With all of the above, the catheters were removed.  Hemostasis was assured and the patient was returned to her room in satisfactory condition.  COMPLICATIONS:  There were no immediate procedure complications.  RESULTS:  A.  Baseline ECG.  Baseline ECG demonstrates sinus rhythm with normal axis and intervals.  HV interval was 40 milliseconds with the AH interval was 70 milliseconds.  QRS  duration was 77 milliseconds. Following ablation, there was no change in these intervals. B.  Rapid ventricular pacing.  Rapid ventricular pacing was carried out from the right ventricle demonstrating VA dissociation at 600 milliseconds. C.  Programmed ventricular stimulation.  Programmed ventricular stimulation was carried out from the right ventricle demonstrating VA dissociation at 600 milliseconds. D.  Programmed atrial stimulation.  Programmed atrial stimulation was carried out from the atrium at a base drive cycle length of 161 milliseconds.  The S1-S2 interval stepwise decreased down to 350 milliseconds with a AV node.  ERP was observed.  During programmed atrial stimulation, there were multiple AH jumps, but no echo beats, and no inducible SVT. E.  Rapid atrial pacing.  Rapid atrial pacing was carried out from the atrium at a base drive cycle length of 096 milliseconds and stepwise decreased down to 210 milliseconds.  The AV Wenckebach cycle length was 490 milliseconds.  Pacing down to 220 milliseconds resulted in the initiation of atrial flutter. F.  Arrhythmias observed. 1. Atrial flutter initiation with rapid atrial pacing and the duration     was sustained.  Termination was spontaneous.  Cycle length 260     milliseconds.     a.     Mapping.  Mapping of atrial flutter demonstrated typical      counterclockwise tricuspid annular reentry.     b.     RF energy application.  A total of 9 RF energy applications      were delivered.  During the 6th RF energy application, there was      atrial flutter isthmus block.  Three additional bonus RF energy      applications were then delivered.  The patient was observed for 30      minutes and no recurrent atrial flutter isthmus conduction.  CONCLUSION:  This study demonstrates successful electrophysiologic study and RF catheter ablation of typical atrial flutter with a total of 9 RF energy applications.  The atrial flutter isthmus  resulting in creation of atrial flutter isthmus block.     Doylene Canning. Ladona Ridgel, MD     GWT/MEDQ  D:  03/04/2013  T:  03/04/2013  Job:  [id number]

## 2013-03-05 NOTE — Progress Notes (Signed)
03/05/2013 1145 No NCM needs identified. Isidoro Donning RN CCM Case Mgmt phone [phone]

## 2013-03-07 ENCOUNTER — Encounter (HOSPITAL_COMMUNITY): Payer: Self-pay | Admitting: *Deleted

## 2013-04-01 ENCOUNTER — Encounter: Payer: Self-pay | Admitting: Cardiology

## 2013-04-01 ENCOUNTER — Ambulatory Visit (INDEPENDENT_AMBULATORY_CARE_PROVIDER_SITE_OTHER): Payer: Self-pay | Admitting: Cardiology

## 2013-04-01 VITALS — BP 138/80 | HR 69 | Wt 171.0 lb

## 2013-04-01 DIAGNOSIS — Z9889 Other specified postprocedural states: Secondary | ICD-10-CM

## 2013-04-01 DIAGNOSIS — I4892 Unspecified atrial flutter: Secondary | ICD-10-CM

## 2013-04-01 DIAGNOSIS — Z8679 Personal history of other diseases of the circulatory system: Secondary | ICD-10-CM

## 2013-04-01 NOTE — Progress Notes (Signed)
[name] MRN: [id number], DOB/AGE: April 13, 1955   Date of Visit: 04/01/2013  Primary Physician: Donnie Coffin, MD Primary EP: Lovena Le, MD Reason for Visit: Post ablation follow-up  History of Present Illness  Christy Abbott is a 58 y.o. female with HTN, DM and atrial flutter s/p recent EPS +RF ablation on 03/04/2013 who presents today for electrophysiology followup. Since her ablation procedure, she reports she is doing well. She noticed blood-tinged discharge when blowing her nose so elected to stop Xarelto on her own on 03/25/2013. She has not had any recurrence. She has not had any recurrent symptoms of atrial flutter. She denies chest pain or shortness of breath. She denies palpitations, dizziness, near syncope or syncope.  Past Medical History Past Medical History  Diagnosis Date  . Hypertension   . Diabetes mellitus without complication   . Chest pain   . Atrial flutter     s/p CTI by Dr Lovena Le 02/2013    Past Surgical History Past Surgical History  Procedure Laterality Date  . Ablation  03-04-2013    CTI by Dr Lovena Le for atrial flutter    Allergies/Intolerances No Known Allergies  Current Home Medications Current Outpatient Prescriptions  Medication Sig Dispense Refill  . aspirin EC 325 MG EC tablet Take 1 tablet (325 mg total) by mouth daily.  30 tablet  0  . Bioflavonoid Products (ESTER C PO) Take 1 tablet by mouth daily.      . Calcium Carbonate-Vitamin D (CALTRATE 600+D PO) Take 1 tablet by mouth 2 (two) times daily.      Marland Kitchen diltiazem (CARDIZEM CD) 240 MG 24 hr capsule Take 1 capsule (240 mg total) by mouth daily.  30 capsule  6  . glimepiride (AMARYL) 4 MG tablet Take 4 mg by mouth daily before breakfast.      . lisinopril-hydrochlorothiazide (PRINZIDE,ZESTORETIC) 20-25 MG per tablet Take 1 tablet by mouth daily.      . metoprolol (LOPRESSOR) 50 MG tablet Take 0.5 tablets (25 mg total) by mouth 2 (two) times daily.  60 tablet  2  . Omega-3 Fatty  Acids (FISH OIL) 1200 MG CAPS Take 1,200 mg by mouth daily.       . potassium chloride (K-DUR,KLOR-CON) 10 MEQ tablet Take 1 tablet (10 mEq total) by mouth 2 (two) times daily.  60 tablet  2   No current facility-administered medications for this visit.    Social History History   Social History  . Marital Status: Married    Spouse Name: N/A    Number of Children: N/A  . Years of Education: N/A   Occupational History  . Not on file.   Social History Main Topics  . Smoking status: Former Research scientist (life sciences)  . Smokeless tobacco: Not on file  . Alcohol Use: No  . Drug Use: No  . Sexual Activity: Not on file   Other Topics Concern  . Not on file   Social History Narrative  . No narrative on file     Review of Systems General: No chills, fever, night sweats or weight changes Cardiovascular: No chest pain, dyspnea on exertion, edema, orthopnea, palpitations, paroxysmal nocturnal dyspnea Dermatological: No rash, lesions or masses Respiratory: No cough, dyspnea Urologic: No hematuria, dysuria Abdominal: No nausea, vomiting, diarrhea, bright red blood per rectum, melena, or hematemesis Neurologic: No visual changes, weakness, changes in mental status All other systems reviewed and are otherwise negative except as noted above.  Physical Exam Vitals: Blood pressure 138/80, pulse 84, weight 171  lb (77.565 kg).  General: Well developed, well appearing 58 y.o. female in no acute distress. HEENT: Normocephalic, atraumatic. EOMs intact. Sclera nonicteric. Oropharynx clear.  Neck: Supple. No JVD. Lungs: Respirations regular and unlabored, CTA bilaterally. No wheezes, rales or rhonchi. Heart: RRR. S1, S2 present. No murmurs, rub, S3 or S4. Abdomen: Soft, non-distended.  Extremities: No clubbing, cyanosis or edema. PT/Radials 2+ and equal bilaterally. Psych: Normal affect. Neuro: Alert and oriented X 3. Moves all extremities spontaneously.   Diagnostics Echocardiogram Oct 2014 Study  Conclusions Left ventricle: The cavity size was normal. There was mild concentric hypertrophy. Systolic function was normal. The estimated ejection fraction was in the range of 60% to 65%. Wall motion was normal; there were no regional wall motion abnormalities. Doppler parameters are consistent with abnormal left ventricular relaxation (grade 1 diastolic dysfunction).  EPS +RF ablation 03/04/2013 CONCLUSION: This study demonstrates successful electrophysiologic study  and RF catheter ablation of typical atrial flutter with a total of 9 RF  energy applications. The atrial flutter isthmus resulting in creation  of atrial flutter isthmus block. 12-lead ECG today  NSR at 69 bpm with normal intervals; PR 140, QRS 82, QT/QTc 408/437  Assessment and Plan 1. Atrial flutter s/p RF ablation 03/04/2013 - in Portal today with no symptomatic or documented recurrence of atrial flutter since ablation - wean metoprolol over the next 1-2 weeks - continue diltiazem as she has required this previously for hypertension - stopped Xarelto on 03/25/2013 - reviewed all EGMs in EPIC - no evidence of atrial fibrillation to date but with CHADS2-VASc score of 3 would need chronic anticoagulation if develops in future - return for follow-up with Dr. Lovena Le as needed  Signed, Ileene Hutchinson, PA-C 04/01/2013, 9:22 AM

## 2013-04-01 NOTE — Patient Instructions (Addendum)
Your physician has recommended you make the following change in your medication:   1. Take  Metoprolol 1/2 tab for 1 week and then take 1/2 tab every other day for 1 week, Then STOP  Your physician recommends that you schedule a follow-up appointment as needed with Dr. Lovena Le

## 2013-04-10 ENCOUNTER — Telehealth: Payer: Self-pay | Admitting: Interventional Cardiology

## 2013-04-10 DIAGNOSIS — I4892 Unspecified atrial flutter: Secondary | ICD-10-CM

## 2013-04-10 NOTE — Telephone Encounter (Signed)
New Message  Pt husband called states that her heart beats are going up to 160-170 while she is at rest for a couple of days and then it stops.. They are concerned. Decline NP or PA made appt for 05/06/2013 at 9:45. Please call to discuss.

## 2013-04-10 NOTE — Telephone Encounter (Addendum)
Patient's husband called to advise that her heart rate has been up to 160 at times on and off for a few days. She is status post atrial flutter ablation 12/23.  He did not know what current meds she was taking and advised that I could call her at work. Spoke with the patient and she states that her BP was 175/110 with a heart rate of 160 this am and after resting for a while it went down to 114 with a BP of 159/94. She is currently weaned down to Metoprolol 25mg  every other day. Still taking Cardizem CD 240mg  every day. No Xarelto currently, takes ASA 81 mg every day.  Discussed above with Dr.Taylor. He advised that she be set up for a 48 hour holter monitor and see him in follow up. Last dose of Metoprolol will be tomorrow and she can restart metoprolol at 25mg  BID AFTER the monitor is finished. Patient verbalized understanding. She will have the monitor placed tomorrow at 415 pm and see Dr.Taylor on 2/3 at 415 PM.

## 2013-04-11 ENCOUNTER — Encounter (INDEPENDENT_AMBULATORY_CARE_PROVIDER_SITE_OTHER): Payer: Self-pay

## 2013-04-11 ENCOUNTER — Encounter: Payer: Self-pay | Admitting: *Deleted

## 2013-04-11 DIAGNOSIS — I4892 Unspecified atrial flutter: Secondary | ICD-10-CM

## 2013-04-11 NOTE — Progress Notes (Signed)
[name], female   DOB: 06/01/1955, 58 y.o.   MRN: [id number] EVO 48 hour holter monitor applied to patient.

## 2013-04-15 ENCOUNTER — Ambulatory Visit (INDEPENDENT_AMBULATORY_CARE_PROVIDER_SITE_OTHER): Payer: Self-pay | Admitting: Internal Medicine

## 2013-04-15 ENCOUNTER — Encounter: Payer: Self-pay | Admitting: Internal Medicine

## 2013-04-15 VITALS — BP 158/92 | HR 93 | Ht 64.0 in | Wt 172.0 lb

## 2013-04-15 DIAGNOSIS — I10 Essential (primary) hypertension: Secondary | ICD-10-CM

## 2013-04-15 DIAGNOSIS — R002 Palpitations: Secondary | ICD-10-CM

## 2013-04-15 DIAGNOSIS — I4892 Unspecified atrial flutter: Secondary | ICD-10-CM

## 2013-04-15 MED ORDER — FLECAINIDE ACETATE 50 MG PO TABS: 50.0000 mg | ORAL_TABLET | Freq: Two times a day (BID) | ORAL | Status: DC

## 2013-04-15 MED ORDER — CARVEDILOL 12.5 MG PO TABS: 12.5000 mg | ORAL_TABLET | Freq: Two times a day (BID) | ORAL | Status: DC

## 2013-04-15 NOTE — Assessment & Plan Note (Signed)
Her blood pressure has not been well controlled. Will switch to coreg from diltiazem.

## 2013-04-15 NOTE — Progress Notes (Signed)
HPI [name] returns today for followup. She is a pleasant 58 yo woman with a h/o atrial flutter s/p ablation who developed recurrent palpitations and wore a cardiac monitor which demonstrated PAC's and PVC's and possible atrial tachycardia but not atrial fib or flutter. She is symptomatic. Her blood pressure has been elevated. No Known Allergies   Current Outpatient Prescriptions  Medication Sig Dispense Refill  . aspirin 81 MG tablet Take 81 mg by mouth daily.      Marland Kitchen Bioflavonoid Products (ESTER C PO) Take 1 tablet by mouth daily.      . Calcium Carbonate-Vitamin D (CALTRATE 600+D PO) Take 1 tablet by mouth 2 (two) times daily.      Marland Kitchen glimepiride (AMARYL) 4 MG tablet Take 4 mg by mouth daily before breakfast.      . lisinopril-hydrochlorothiazide (PRINZIDE,ZESTORETIC) 20-25 MG per tablet Take 1 tablet by mouth daily.      . Omega-3 Fatty Acids (FISH OIL) 1200 MG CAPS Take 1,200 mg by mouth daily.       . potassium chloride (K-DUR,KLOR-CON) 10 MEQ tablet Take 1 tablet (10 mEq total) by mouth 2 (two) times daily.  60 tablet  2  . carvedilol (COREG) 12.5 MG tablet Take 1 tablet (12.5 mg total) by mouth 2 (two) times daily.  180 tablet  3  . flecainide (TAMBOCOR) 50 MG tablet Take 1 tablet (50 mg total) by mouth 2 (two) times daily.  180 tablet  3   No current facility-administered medications for this visit.     Past Medical History  Diagnosis Date  . Hypertension   . Diabetes mellitus without complication   . Chest pain   . Atrial flutter     s/p CTI by Dr Lovena Le 02/2013    ROS:   All systems reviewed and negative except as noted in the HPI.   Past Surgical History  Procedure Laterality Date  . Ablation  03-04-2013    CTI by Dr Lovena Le for atrial flutter     Family History  Problem Relation Age of Onset  . Atrial fibrillation Mother   . Arrhythmia Mother   . Diabetes Mother   . Hodgkin's lymphoma Father      History   Social History  . Marital Status:  Married    Spouse Name: N/A    Number of Children: N/A  . Years of Education: N/A   Occupational History  . Not on file.   Social History Main Topics  . Smoking status: Former Research scientist (life sciences)  . Smokeless tobacco: Not on file  . Alcohol Use: No  . Drug Use: No  . Sexual Activity: Not on file   Other Topics Concern  . Not on file   Social History Narrative  . No narrative on file     BP 158/92  Pulse 93  Ht 5\' 4"  (1.626 m)  Wt 172 lb (78.019 kg)  BMI 29.51 kg/m2  Physical Exam:  Well appearing middle aged woman, NAD HEENT: Unremarkable Neck:  No JVD, no thyromegally Back:  No CVA tenderness Lungs:  Clear with no wheezes HEART:  Regular rate rhythm, no murmurs, no rubs, no clicks Abd:  soft, positive bowel sounds, no organomegally, no rebound, no guarding Ext:  2 plus pulses, no edema, no cyanosis, no clubbing Skin:  No rashes no nodules Neuro:  CN II through XII intact, motor grossly intact  Holter - nsr with sinus tachy and frequent PVC's and PAC's.    Assess/Plan:

## 2013-04-15 NOTE — Assessment & Plan Note (Signed)
She is symptomatic with her PAC's and PVC's and I have recommended that she start her flecainide 50 mg twice daily. She will add a beta blocker.

## 2013-04-15 NOTE — Patient Instructions (Addendum)
Your physician recommends that you schedule a follow-up appointment in: 7-10 days with nurse room visit(EKG) and lab work(BMP)  Your physician wants you to follow-up in: 3 months with Dr Knox Saliva will receive a reminder letter in the mail two months in advance. If you don't receive a letter, please call our office to schedule the follow-up appointment.   Your physician has recommended you make the following change in your medication:  1) Start Carvedilol 12.5mg  1/2 tablet twice daily for 2 weeks then increase to 1 tablet twice daily 2) Start Flecainide 50mg  twice daily 3) Stop Diltiazem

## 2013-04-21 ENCOUNTER — Encounter: Payer: Self-pay | Admitting: Family

## 2013-04-21 ENCOUNTER — Ambulatory Visit (INDEPENDENT_AMBULATORY_CARE_PROVIDER_SITE_OTHER): Payer: Self-pay | Admitting: Family

## 2013-04-21 VITALS — BP 148/80 | HR 86 | Temp 97.8°F | Resp 16 | Ht 64.5 in | Wt 170.0 lb

## 2013-04-21 DIAGNOSIS — I4892 Unspecified atrial flutter: Secondary | ICD-10-CM

## 2013-04-21 DIAGNOSIS — E119 Type 2 diabetes mellitus without complications: Secondary | ICD-10-CM

## 2013-04-21 DIAGNOSIS — I10 Essential (primary) hypertension: Secondary | ICD-10-CM

## 2013-04-21 LAB — HEMOGLOBIN A1C
HEMOGLOBIN A1C: 9.7 % — AB (ref <5.7)
MEAN PLASMA GLUCOSE: 232 mg/dL — AB (ref <117)

## 2013-04-21 MED ORDER — LISINOPRIL-HYDROCHLOROTHIAZIDE 20-25 MG PO TABS: 1.0000 | ORAL_TABLET | Freq: Every day | ORAL | Status: DC

## 2013-04-21 MED ORDER — GLIMEPIRIDE 4 MG PO TABS: 4.0000 mg | ORAL_TABLET | Freq: Every day | ORAL | Status: DC

## 2013-04-21 NOTE — Assessment & Plan Note (Addendum)
Immunizations, eye exam up to date.  Obtain A1C and plan to adjust meds after review of most recent A1C.  She has bmet scheduled this week with cardiology.  Limit labs at pt request due to cost.  She reports some GI side effects in the past with metformin.

## 2013-04-21 NOTE — Assessment & Plan Note (Signed)
Clinically stable. Management per cardiology.

## 2013-04-21 NOTE — Progress Notes (Signed)
Pre visit review using our clinic review tool, if applicable. No additional management support is needed unless otherwise documented below in the visit note. 

## 2013-04-21 NOTE — Progress Notes (Signed)
Subjective:    [name], female    DOB: [birth year], 58 y.o.   MRN: [id number]  HPI  Ms.  Abbott is a 58 yr old female who presents today to establish care. Was previously followed at [facility].   1) Atrial Flutter- initially diagnosed in October 2014.  She was admitted at time of diagnosis.  She had ablation on 03/04/13. She most recently saw Dr. Crissie Sickles on 04/15/13 and was started on flecainide for symptomatic PAC's and PVC's. The pt notes improvement in her symptoms following flecainide initiation.    2) HTN- current meds include- coreg, zestoretic. BP Readings from Last 3 Encounters:  04/21/13 148/80  04/15/13 158/92  04/01/13 138/80    3) DM2- reports that she was diagnosed about 5 yrs ago. Reports she is unaware of her last A1c. Hospital records indicate last A1C was >11!   She reports that she has not taken her sugars regularly.  Last eye exam was <1 year ago.  Was told normal eye exam.  She had a flu shot and is up to date on pneumovax.   Unfortunately, Christy Abbott is self insured.  She is hopeful that this is going to change in the near future.   Review of Systems  Constitutional: Negative for unexpected weight change.  HENT:       Mild nasal congestion  Respiratory: Negative for cough.   Cardiovascular: Positive for palpitations. Negative for chest pain.  Gastrointestinal: Negative for nausea and vomiting.  Genitourinary: Negative for dysuria and frequency.  Musculoskeletal: Negative for arthralgias and myalgias.  Skin: Negative for rash.  Neurological: Negative for headaches.  Hematological: Negative for adenopathy.  Psychiatric/Behavioral:       Denies depression/anxiety       Past Medical History  Diagnosis Date  . Hypertension   . Diabetes mellitus without complication   . Chest pain   . Atrial flutter     s/p CTI by Dr Lovena Le 02/2013    History   Social History  . Marital Status: Married    Spouse Name: N/A    Number of  Children: N/A  . Years of Education: N/A   Occupational History  . Not on file.   Social History Main Topics  . Smoking status: Former Research scientist (life sciences)  . Smokeless tobacco: Not on file  . Alcohol Use: No  . Drug Use: No  . Sexual Activity: Not on file   Other Topics Concern  . Not on file   Social History Narrative   Married   Clinical cytogeneticist- full time   Daughter- 2 grandchildren, live in [place]   Enjoys playing on computer.      Past Surgical History  Procedure Laterality Date  . Ablation  03-04-2013    CTI by Dr Lovena Le for atrial flutter  . Tonsillectomy  1972 ?  Marland Kitchen Cholecystectomy  1982    Family History  Problem Relation Age of Onset  . Atrial fibrillation Mother   . Arrhythmia Mother   . Diabetes Mother   . Hodgkin's lymphoma Father     No Known Allergies  Current Outpatient Prescriptions on File Prior to Visit  Medication Sig Dispense Refill  . aspirin 81 MG tablet Take 81 mg by mouth daily.      Marland Kitchen Bioflavonoid Products (ESTER C PO) Take 1 tablet by mouth daily.      . Calcium Carbonate-Vitamin D (CALTRATE 600+D PO) Take 1 tablet by mouth 2 (two) times daily.      Marland Kitchen  carvedilol (COREG) 12.5 MG tablet Take 1 tablet (12.5 mg total) by mouth 2 (two) times daily.  180 tablet  3  . flecainide (TAMBOCOR) 50 MG tablet Take 1 tablet (50 mg total) by mouth 2 (two) times daily.  180 tablet  3  . Omega-3 Fatty Acids (FISH OIL) 1200 MG CAPS Take 1,200 mg by mouth daily.       . potassium chloride (K-DUR,KLOR-CON) 10 MEQ tablet Take 1 tablet (10 mEq total) by mouth 2 (two) times daily.  60 tablet  2   No current facility-administered medications on file prior to visit.    BP 148/80  Pulse 86  Temp(Src) 97.8 F (36.6 C) (Oral)  Resp 16  Ht 5' 4.5" (1.638 m)  Wt 170 lb 0.6 oz (77.13 kg)  BMI 28.75 kg/m2  SpO2 99%    Objective:   Physical Exam  Constitutional: She is oriented to person, place, and time. She appears well-developed and well-nourished. No distress.  HENT:    Head: Normocephalic and atraumatic.  Cardiovascular: Normal rate and regular rhythm.   No murmur heard. Pulmonary/Chest: Effort normal and breath sounds normal. No respiratory distress. She has no wheezes. She has no rales. She exhibits no tenderness.  Neurological: She is alert and oriented to person, place, and time.  Psychiatric: She has a normal mood and affect. Her behavior is normal. Judgment and thought content normal.          Assessment & Plan:

## 2013-04-21 NOTE — Patient Instructions (Signed)
Please check sugar 1-2 times daily, record readings and bring to your upcoming appointment. Complete lab work prior to leaving. Follow up in 2 months.

## 2013-04-21 NOTE — Assessment & Plan Note (Signed)
Fair BP control.  If SBP remains > 140 next visit, consider med adjustment.

## 2013-04-22 ENCOUNTER — Telehealth: Payer: Self-pay | Admitting: Family

## 2013-04-22 ENCOUNTER — Ambulatory Visit (INDEPENDENT_AMBULATORY_CARE_PROVIDER_SITE_OTHER): Payer: Self-pay | Admitting: *Deleted

## 2013-04-22 ENCOUNTER — Encounter: Payer: Self-pay | Admitting: Internal Medicine

## 2013-04-22 ENCOUNTER — Other Ambulatory Visit (INDEPENDENT_AMBULATORY_CARE_PROVIDER_SITE_OTHER): Payer: Self-pay

## 2013-04-22 DIAGNOSIS — I4892 Unspecified atrial flutter: Secondary | ICD-10-CM

## 2013-04-22 LAB — BASIC METABOLIC PANEL
BUN: 21 mg/dL (ref 6–23)
CO2: 28 mEq/L (ref 19–32)
Calcium: 9.5 mg/dL (ref 8.4–10.5)
Chloride: 99 mEq/L (ref 96–112)
Creatinine, Ser: 0.7 mg/dL (ref 0.4–1.2)
GFR: 87.1 mL/min (ref >60.00)
Glucose, Bld: 349 mg/dL — ABNORMAL HIGH (ref 70–99)
Potassium: 3.5 mEq/L (ref 3.5–5.1)
Sodium: 137 mEq/L (ref 135–145)

## 2013-04-22 MED ORDER — METFORMIN HCL ER (MOD) 500 MG PO TB24
ORAL_TABLET | ORAL | Status: DC
Start: 2013-04-22 — End: 2013-06-03

## 2013-04-22 NOTE — Progress Notes (Signed)
Pt in for an EKG. RKG done per CMA, and read per Dr. Lovena Le MD SR 79 beats/minute. Pt was started on Flecainide 50 mg twice a day. Pt had lab drown  then left for home. RN was not able to ask patient any questions or take BP.

## 2013-04-22 NOTE — Telephone Encounter (Signed)
Relevant patient education assigned to patient using Emmi. ° °

## 2013-04-22 NOTE — Telephone Encounter (Signed)
Pls call pt and let her know that sugar remains uncontrolled.  I would like her to add glumetza- extended release metformin to see if she can tolerate better. I sent rx to her pharmacy.  I have a copay card for her to pick up which might help.

## 2013-04-22 NOTE — Telephone Encounter (Signed)
Notified pt's spouse of results. He states he will pick up rx and savings card. Both placed at front desk for pick up.

## 2013-05-06 ENCOUNTER — Encounter: Payer: Self-pay | Admitting: Interventional Cardiology

## 2013-05-06 ENCOUNTER — Ambulatory Visit (INDEPENDENT_AMBULATORY_CARE_PROVIDER_SITE_OTHER): Payer: Self-pay | Admitting: Interventional Cardiology

## 2013-05-06 VITALS — BP 120/74 | HR 83 | Ht 64.0 in | Wt 165.0 lb

## 2013-05-06 DIAGNOSIS — Z5181 Encounter for therapeutic drug level monitoring: Secondary | ICD-10-CM

## 2013-05-06 DIAGNOSIS — I4892 Unspecified atrial flutter: Secondary | ICD-10-CM

## 2013-05-06 DIAGNOSIS — I10 Essential (primary) hypertension: Secondary | ICD-10-CM

## 2013-05-06 DIAGNOSIS — R002 Palpitations: Secondary | ICD-10-CM

## 2013-05-06 DIAGNOSIS — Z79899 Other long term (current) drug therapy: Secondary | ICD-10-CM

## 2013-05-06 NOTE — Progress Notes (Signed)
Patient ID: CARISMA TROUPE, female   DOB: 1955/06/23, 58 y.o.   MRN: [id number] Patient ID: LOETTA CONNELLEY, female   DOB: [birth year], 58 y.o.   MRN: [id number]     Patient ID: ROSSLYN PASION MRN: [id number] DOB/AGE: 03-10-1956 58 y.o.   Referring Physician Dr. Alroy Dust   Reason for Consultation palpitations  HPI: 6 y/o who has had symptomatic atrial flutter.  She was hospitalized in October for atrial flutter. She spontaneously converted to normal sinus rhythm. She received digoxin, diltiazem, metoprolol and subsequently amiodarone. All this helped her convert. She had recurrent atrial flutter trated with ablation.  Amiodarone and diltiazem were stopped. Digoxin was stopped as well.  She had recurrent palpitations and monitor showed PACs, PVCs, atrial tach.  Flecainide and beta blocker were started.  Her sx resolved.  Her BP is still high in the mornings.    She has increased her coreg to 12.5 mg BID starting today.     Current Outpatient Prescriptions  Medication Sig Dispense Refill  . aspirin 81 MG tablet Take 81 mg by mouth daily.      Marland Kitchen Bioflavonoid Products (ESTER C PO) Take 1 tablet by mouth daily.      . Calcium Carbonate-Vitamin D (CALTRATE 600+D PO) Take 1 tablet by mouth 2 (two) times daily.      . carvedilol (COREG) 12.5 MG tablet Take 1 tablet (12.5 mg total) by mouth 2 (two) times daily.  180 tablet  3  . flecainide (TAMBOCOR) 50 MG tablet Take 1 tablet (50 mg total) by mouth 2 (two) times daily.  180 tablet  3  . glimepiride (AMARYL) 4 MG tablet Take 1 tablet (4 mg total) by mouth daily before breakfast.  30 tablet  3  . lisinopril-hydrochlorothiazide (PRINZIDE,ZESTORETIC) 20-25 MG per tablet Take 1 tablet by mouth daily.  30 tablet  3  . metFORMIN (GLUMETZA) 500 MG (MOD) 24 hr tablet Advance as tolerated: One tablet by mouth daily for 1 week, then 2 tabs daily x 1 week, then 3 tabs daily x 1 week, then 4 tabs daily x 1 week  120 tablet  2  . Omega-3 Fatty Acids (FISH  OIL) 1200 MG CAPS Take 1,200 mg by mouth daily.        No current facility-administered medications for this visit.   Past Medical History  Diagnosis Date  . Hypertension   . Diabetes mellitus without complication   . Chest pain   . Atrial flutter     s/p CTI by Dr Lovena Le 02/2013    Family History  Problem Relation Age of Onset  . Atrial fibrillation Mother   . Arrhythmia Mother   . Diabetes Mother   . Hodgkin's lymphoma Father     History   Social History  . Marital Status: Married    Spouse Name: N/A    Number of Children: N/A  . Years of Education: N/A   Occupational History  . Not on file.   Social History Main Topics  . Smoking status: Former Research scientist (life sciences)  . Smokeless tobacco: Not on file  . Alcohol Use: No  . Drug Use: No  . Sexual Activity: Not on file   Other Topics Concern  . Not on file   Social History Narrative   Married   Clinical cytogeneticist- full time   Daughter- 2 grandchildren, live in [place]   Enjoys playing on computer.      Past Surgical History  Procedure Laterality Date  . Ablation  03-04-2013  CTI by Dr Lovena Le for atrial flutter  . Tonsillectomy  1972 ?  Marland Kitchen Cholecystectomy  1982      (Not in a hospital admission)  Review of systems complete and found to be negative unless listed above .  No nausea, vomiting.  No fever chills, No focal weakness,  No palpitations.  Physical Exam: Filed Vitals:   05/06/13 1009  BP: 120/74  Pulse: 83    Weight: 165 lb (74.844 kg)  Physical exam:  Parker/AT EOMI No JVD, No carotid bruit Tachycardic S1S2  No wheezing Soft. NT, nondistended No edema. No focal motor or sensory deficits Normal affect  Labs:   Lab Results  Component Value Date   WBC 10.6* 02/25/2013   HGB 16.1* 02/25/2013   HCT 46.4* 02/25/2013   MCV 87.0 02/25/2013   PLT 213.0 02/25/2013   No results found for this basename: NA, K, CL, CO2, BUN, CREATININE, CALCIUM, LABALBU, PROT, BILITOT, ALKPHOS, ALT, AST, GLUCOSE,  in the last 168  hours Lab Results  Component Value Date   CKTOTAL 31 09/24/2008   CKMB 0.7 09/24/2008   TROPONINI <0.30 12/22/2012    No results found for this basename: CHOL   No results found for this basename: HDL   No results found for this basename: LDLCALC   No results found for this basename: TRIG   No results found for this basename: CHOLHDL   No results found for this basename: LDLDIRECT      Radiology: Echo in October showed normal left  function with normal valvular function. EKG: Atrial flutter with 2:1 conduction pre-ablation  ASSESSMENT AND PLAN:  Atrial flutter: Was symptomatic.  Now off Xarelto, post  TEE/ablation. Continue Flecainide and Coreg or PACs, PVCs and atrial tachycardia.  Sx controlled.  She will need ETT for flecainide.  SHe does want to start an exercise program.   Fatigue: Improved post TEE/ablation.    Hypertension: Early morning readings have been high, up to 503 systolic. Today, she started taking the higher dose of carvedilol. Will see if this helps the blood pressure readings. Typically, blood pressure readings during the day have been controlled after she takes her medicine.  If morning blood pressure readings remain high, could increase the evening dose of carvedilol to 25 mg while keeping the a.m. dose to 12.5 mg.  Another option would be to split up her lisinopril and HCTZ from the combination pill. She could then take lisinopril in the evening and the diuretic in the morning.  Signed:   Mina Marble, MD, [facility] 05/06/2013, 10:22 AM

## 2013-05-06 NOTE — Patient Instructions (Signed)
Your physician has requested that you have an exercise tolerance test. For further information please visit [email or web address]. Please also follow instruction sheet, as given. STAY ON ALL MEDICATIONS FOR TEST.  Your physician has requested that you regularly monitor and record your blood pressure readings at home. Please use the same machine at the same time of day to check your readings and record them. Call in the next 2 weeks with BP readings. We may need to adjust medications.  Your physician wants you to follow-up in: 6 months with Dr. Irish Lack. You will receive a reminder letter in the mail two months in advance. If you don't receive a letter, please call our office to schedule the follow-up appointment.

## 2013-05-15 ENCOUNTER — Ambulatory Visit (HOSPITAL_COMMUNITY)
Admission: RE | Admit: 2013-05-15 | Discharge: 2013-05-15 | Disposition: A | Discharge status: Home / Self Care | Payer: Self-pay | Source: Ambulatory Visit | Attending: Cardiology | Admitting: Cardiology

## 2013-05-15 DIAGNOSIS — Z79899 Other long term (current) drug therapy: Secondary | ICD-10-CM

## 2013-05-15 DIAGNOSIS — Z5181 Encounter for therapeutic drug level monitoring: Secondary | ICD-10-CM

## 2013-05-23 ENCOUNTER — Telehealth: Payer: Self-pay | Admitting: Interventional Cardiology

## 2013-05-23 NOTE — Telephone Encounter (Signed)
Negative stress. COntinue flecainide.

## 2013-05-23 NOTE — Telephone Encounter (Signed)
Pt.notified

## 2013-06-03 ENCOUNTER — Telehealth: Payer: Self-pay | Admitting: *Deleted

## 2013-06-03 MED ORDER — METFORMIN HCL ER (MOD) 500 MG PO TB24: ORAL_TABLET | ORAL | Status: DC

## 2013-06-03 NOTE — Telephone Encounter (Signed)
Spoke with pt's husband. He states pt never picked up rx or copay card for [facility].  Rx re-printed and placed at front desk with copay card for pt's spouse to pick up tomorrow. Left note on envelope that pt needs to schedule follow up after 06/19/13.

## 2013-06-04 ENCOUNTER — Telehealth: Payer: Self-pay | Admitting: *Deleted

## 2013-06-04 MED ORDER — METFORMIN HCL 500 MG PO TABS: ORAL_TABLET | ORAL | Status: DC

## 2013-06-04 NOTE — Telephone Encounter (Signed)
The alternative is regular release metformin, though this is more likely to cause diarrhea.  Will send regular release to see if she will be able to tolerate.

## 2013-06-04 NOTE — Telephone Encounter (Signed)
Received call from pt's spouse that pt does not have insurance.  Cost of glumetza with discount card would $700. Pt cannot afford this and is requesting something cheaper.  Please advise.

## 2013-06-06 NOTE — Telephone Encounter (Signed)
Notified pt's husband and he voices understanding.

## 2013-06-23 ENCOUNTER — Encounter: Payer: Self-pay | Admitting: Family

## 2013-06-23 ENCOUNTER — Ambulatory Visit (INDEPENDENT_AMBULATORY_CARE_PROVIDER_SITE_OTHER): Payer: Self-pay | Admitting: Family

## 2013-06-23 VITALS — BP 114/84 | HR 83 | Temp 97.9°F | Wt 160.1 lb

## 2013-06-23 DIAGNOSIS — E119 Type 2 diabetes mellitus without complications: Secondary | ICD-10-CM

## 2013-06-23 DIAGNOSIS — I10 Essential (primary) hypertension: Secondary | ICD-10-CM

## 2013-06-23 MED ORDER — EMPAGLIFLOZIN 10 MG PO TABS: 1.0000 | ORAL_TABLET | Freq: Every day | ORAL | Status: DC

## 2013-06-23 NOTE — Assessment & Plan Note (Addendum)
Remains uncontrolled.  Pt was counseled on diabetic diet. Jardiance samples 10mg .  #14 tabs provided- samples.  Lot [id number] A exp oct 16

## 2013-06-23 NOTE — Progress Notes (Signed)
Pre visit review using our clinic review tool, if applicable. No additional management support is needed unless otherwise documented below in the visit note. 

## 2013-06-23 NOTE — Assessment & Plan Note (Signed)
BP stable on current meds.   

## 2013-06-23 NOTE — Progress Notes (Signed)
Subjective:    [name], female    DOB: 1955/12/28, 58 y.o.   MRN: [id number]  HPI  Christy Abbott is a 58 yr old female who presents today for follow up.   1) DM2- Last A1C was 9.7 back in February. It was recommended that she try glumetza. They were unable to afford extended release and standard release metformin was sent instead. Reports that this AM her sugar was 245. At a donut last night.    2) HTN-  BP Readings from Last 3 Encounters:  06/23/13 114/84  05/06/13 120/74  04/21/13 148/80   Currently on lisinoprol, hctz and carvedilol.     Review of Systems See HPI  Past Medical History  Diagnosis Date  . Hypertension   . Diabetes mellitus without complication   . Chest pain   . Atrial flutter     s/p CTI by Dr Lovena Le 02/2013    History   Social History  . Marital Status: Married    Spouse Name: N/A    Number of Children: N/A  . Years of Education: N/A   Occupational History  . Not on file.   Social History Main Topics  . Smoking status: Former Research scientist (life sciences)  . Smokeless tobacco: Not on file  . Alcohol Use: No  . Drug Use: No  . Sexual Activity: Not on file   Other Topics Concern  . 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      Past Surgical History  Procedure Laterality Date  . Ablation  03-04-2013    CTI by Dr Lovena Le for atrial flutter  . Tonsillectomy  1972 ?  Marland Kitchen Cholecystectomy  1982    Family History  Problem Relation Age of Onset  . Atrial fibrillation Mother   . Arrhythmia Mother   . Diabetes Mother   . Hodgkin's lymphoma Father     No Known Allergies  Current Outpatient Prescriptions on File Prior to Visit  Medication Sig Dispense Refill  . aspirin 81 MG tablet Take 81 mg by mouth daily.      Marland Kitchen Bioflavonoid Products (ESTER C PO) Take 1 tablet by mouth daily.      . Calcium Carbonate-Vitamin D (CALTRATE 600+D PO)  Take 1 tablet by mouth 2 (two) times daily.      . carvedilol (COREG) 12.5 MG tablet Take 1 tablet (12.5 mg total) by mouth 2 (two) times daily.  180 tablet  3  . flecainide (TAMBOCOR) 50 MG tablet Take 1 tablet (50 mg total) by mouth 2 (two) times daily.  180 tablet  3  . glimepiride (AMARYL) 4 MG tablet Take 1 tablet (4 mg total) by mouth daily before breakfast.  30 tablet  3  . lisinopril-hydrochlorothiazide (PRINZIDE,ZESTORETIC) 20-25 MG per tablet Take 1 tablet by mouth daily.  30 tablet  3  . metFORMIN (GLUCOPHAGE) 500 MG tablet One tab twice daily with meals for 1 week. If tolerated, increase to 2 tabs twice daily  120 tablet  2  . Omega-3 Fatty Acids (FISH OIL) 1200 MG CAPS Take 1,200 mg by mouth daily.        No current facility-administered medications on file prior to visit.    BP 114/84  Pulse 83  Temp(Src) 97.9 F (36.6 C) (Oral)  Wt 160 lb 1.3 oz (72.612 kg)  SpO2 93%  Objective:   Physical Exam  Constitutional: She is oriented to person, place, and time. She appears well-developed and well-nourished. No distress.  HENT:  Head: Normocephalic and atraumatic.  Cardiovascular: Normal rate and regular rhythm.   No murmur heard. Pulmonary/Chest: Effort normal and breath sounds normal. No respiratory distress. She has no wheezes. She has no rales. She exhibits no tenderness.  Musculoskeletal: She exhibits no edema.  Neurological: She is alert and oriented to person, place, and time.  Psychiatric: She has a normal mood and affect. Her behavior is normal. Judgment and thought content normal.          Assessment & Plan:

## 2013-06-23 NOTE — Patient Instructions (Signed)
Start Jardiance once daily. Continue metformin 500mg  twice daily.  Increase to 1000mg  twice daily if sugars remain >200 after starting jardiance. Follow up in 3 months.

## 2013-07-07 ENCOUNTER — Telehealth: Payer: Self-pay

## 2013-07-07 NOTE — Telephone Encounter (Signed)
Relevant patient education assigned to patient using Emmi. ° °

## 2013-07-24 ENCOUNTER — Encounter: Payer: Self-pay | Admitting: Internal Medicine

## 2013-07-24 ENCOUNTER — Ambulatory Visit (INDEPENDENT_AMBULATORY_CARE_PROVIDER_SITE_OTHER): Payer: Self-pay | Admitting: Internal Medicine

## 2013-07-24 VITALS — BP 136/85 | HR 91 | Ht 64.0 in | Wt 158.0 lb

## 2013-07-24 DIAGNOSIS — R002 Palpitations: Secondary | ICD-10-CM

## 2013-07-24 DIAGNOSIS — I10 Essential (primary) hypertension: Secondary | ICD-10-CM

## 2013-07-24 DIAGNOSIS — I4892 Unspecified atrial flutter: Secondary | ICD-10-CM

## 2013-07-24 NOTE — Assessment & Plan Note (Signed)
She continues to have symptoms. She admits to not taking her flecainide. Her symptoms are not severe but bothersome. I have asked her to restart her flecainide.

## 2013-07-24 NOTE — Assessment & Plan Note (Signed)
Her blood pressure is well controlled. She will continue her current meds.  

## 2013-07-24 NOTE — Patient Instructions (Signed)
Your physician wants you to follow-up in: 12 months with Dr. Taylor. You will receive a reminder letter in the mail two months in advance. If you don't receive a letter, please call our office to schedule the follow-up appointment.    

## 2013-07-24 NOTE — Progress Notes (Signed)
HPI Christy Abbott returns today for followup. She is a pleasant 58 yo woman with a h/o atrial flutter s/p ablation who developed recurrent palpitations and wore a cardiac monitor which demonstrated PAC's and PVC's and possible atrial tachycardia but not atrial fib or flutter. She is symptomatic. Her blood pressure has been elevated. She notes that she has not been taking her flecainide and has had a few palpitations. Allergies  Allergen Reactions  . Jardiance [Empagliflozin]     Causes heart palpitations     Current Outpatient Prescriptions  Medication Sig Dispense Refill  . aspirin 81 MG tablet Take 81 mg by mouth daily.      Marland Kitchen Bioflavonoid Products (ESTER C PO) Take 1 tablet by mouth daily.      . Calcium Carbonate-Vitamin D (CALTRATE 600+D PO) Take 1 tablet by mouth 2 (two) times daily.      . carvedilol (COREG) 12.5 MG tablet Take 1 tablet (12.5 mg total) by mouth 2 (two) times daily.  180 tablet  3  . flecainide (TAMBOCOR) 50 MG tablet Take 1 tablet (50 mg total) by mouth 2 (two) times daily.  180 tablet  3  . glimepiride (AMARYL) 4 MG tablet Take 1 tablet (4 mg total) by mouth daily before breakfast.  30 tablet  3  . lisinopril-hydrochlorothiazide (PRINZIDE,ZESTORETIC) 20-25 MG per tablet Take 1 tablet by mouth daily.  30 tablet  3  . metFORMIN (GLUCOPHAGE) 500 MG tablet Take 1,000 mg by mouth 2 (two) times daily with a meal.      . Omega-3 Fatty Acids (FISH OIL) 1200 MG CAPS Take 1,200 mg by mouth daily.        No current facility-administered medications for this visit.     Past Medical History  Diagnosis Date  . Hypertension   . Diabetes mellitus without complication   . Chest pain   . Atrial flutter     s/p CTI by Dr Lovena Le 02/2013    ROS:   All systems reviewed and negative except as noted in the HPI.   Past Surgical History  Procedure Laterality Date  . Ablation  03-04-2013    CTI by Dr Lovena Le for atrial flutter  . Tonsillectomy  1972 ?  Marland Kitchen  Cholecystectomy  1982     Family History  Problem Relation Age of Onset  . Atrial fibrillation Mother   . Arrhythmia Mother   . Diabetes Mother   . Hodgkin's lymphoma Father      History   Social History  . Marital Status: Married    Spouse Name: N/A    Number of Children: N/A  . Years of Education: N/A   Occupational History  . Not on file.   Social History Main Topics  . Smoking status: Former Research scientist (life sciences)  . Smokeless tobacco: Not on file  . Alcohol Use: No  . Drug Use: No  . Sexual Activity: Not on file   Other Topics Concern  . 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       BP 136/85  Pulse 91  Ht 5\' 4"  (1.626 m)  Wt 158 lb (71.668 kg)  BMI 27.11 kg/m2  Physical Exam:  Well appearing middle aged woman, NAD HEENT: Unremarkable Neck:  No JVD, no thyromegally Back:  No CVA tenderness Lungs:  Clear with no wheezes HEART:  Regular  rate rhythm, no murmurs, no rubs, no clicks Abd:  soft, positive bowel sounds, no organomegally, no rebound, no guarding Ext:  2 plus pulses, no edema, no cyanosis, no clubbing Skin:  No rashes no nodules Neuro:  CN II through XII intact, motor grossly intact   NSR with STT abnormality  Assess/Plan:

## 2013-08-06 ENCOUNTER — Encounter: Payer: Self-pay | Admitting: Family

## 2013-08-06 ENCOUNTER — Ambulatory Visit (INDEPENDENT_AMBULATORY_CARE_PROVIDER_SITE_OTHER): Payer: Self-pay | Admitting: Family

## 2013-08-06 VITALS — BP 130/85 | HR 87 | Temp 98.0°F | Resp 16 | Ht 64.5 in | Wt 156.0 lb

## 2013-08-06 DIAGNOSIS — N390 Urinary tract infection, site not specified: Secondary | ICD-10-CM

## 2013-08-06 DIAGNOSIS — E119 Type 2 diabetes mellitus without complications: Secondary | ICD-10-CM

## 2013-08-06 DIAGNOSIS — Z0000 Encounter for general adult medical examination without abnormal findings: Secondary | ICD-10-CM

## 2013-08-06 LAB — HEPATIC FUNCTION PANEL
ALBUMIN: 3.5 g/dL (ref 3.5–5.2)
ALK PHOS: 85 U/L (ref 39–117)
ALT: 18 U/L (ref 0–35)
AST: 21 U/L (ref 0–37)
BILIRUBIN INDIRECT: 0.8 mg/dL (ref 0.2–1.2)
BILIRUBIN TOTAL: 1 mg/dL (ref 0.2–1.2)
Bilirubin, Direct: 0.2 mg/dL (ref 0.0–0.3)
Total Protein: 6.3 g/dL (ref 6.0–8.3)

## 2013-08-06 LAB — BASIC METABOLIC PANEL
BUN: 25 mg/dL — ABNORMAL HIGH (ref 6–23)
CALCIUM: 10.3 mg/dL (ref 8.4–10.5)
CO2: 29 mEq/L (ref 19–32)
Chloride: 100 mEq/L (ref 96–112)
Creat: 0.64 mg/dL (ref 0.50–1.10)
Glucose, Bld: 166 mg/dL — ABNORMAL HIGH (ref 70–99)
POTASSIUM: 3.7 meq/L (ref 3.5–5.3)
SODIUM: 140 meq/L (ref 135–145)

## 2013-08-06 LAB — POCT URINALYSIS DIPSTICK
Glucose, UA: NEGATIVE
Nitrite, UA: POSITIVE
PH UA: 6
Protein, UA: 100
Urobilinogen, UA: 0.2

## 2013-08-06 LAB — LIPID PANEL
Cholesterol: 153 mg/dL (ref 0–200)
HDL: 36 mg/dL — AB (ref >39)
LDL Cholesterol: 78 mg/dL (ref 0–99)
Total CHOL/HDL Ratio: 4.3 Ratio
Triglycerides: 193 mg/dL — ABNORMAL HIGH (ref <150)
VLDL: 39 mg/dL (ref 0–40)

## 2013-08-06 LAB — HEMOGLOBIN A1C
HEMOGLOBIN A1C: 9.3 % — AB (ref <5.7)
MEAN PLASMA GLUCOSE: 220 mg/dL — AB (ref <117)

## 2013-08-06 MED ORDER — CIPROFLOXACIN HCL 500 MG PO TABS: 500.0000 mg | ORAL_TABLET | Freq: Two times a day (BID) | ORAL | Status: DC

## 2013-08-06 NOTE — Progress Notes (Signed)
Pre visit review using our clinic review tool, if applicable. No additional management support is needed unless otherwise documented below in the visit note. 

## 2013-08-06 NOTE — Progress Notes (Signed)
Subjective:    [name], female    DOB: 1955/07/25, 58 y.o.   MRN: [id number]  HPI  Christy Abbott is a 58 yr old female who presents today with chief complaint of urinary urgency. She reports symptoms started Saturday with with dysuria and urgency. Since that time she has had suprapubic "shooting pains and my sides hurt." Using AZO with only mild improvement. Denies hematuria, fever, vomiting. Has had mild nausea.      Review of Systems See HPI  Past Medical History  Diagnosis Date  . Hypertension   . Diabetes mellitus without complication   . Chest pain   . Atrial flutter     s/p CTI by Dr Lovena Le 02/2013    History   Social History  . Marital Status: Married    Spouse Name: N/A    Number of Children: N/A  . Years of Education: N/A   Occupational History  . Not on file.   Social History Main Topics  . Smoking status: Former Research scientist (life sciences)  . Smokeless tobacco: Not on file  . Alcohol Use: No  . Drug Use: No  . Sexual Activity: Not on file   Other Topics Concern  . 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      Past Surgical History  Procedure Laterality Date  . Ablation  03-04-2013    CTI by Dr Lovena Le for atrial flutter  . Tonsillectomy  1972 ?  Marland Kitchen Cholecystectomy  1982    Family History  Problem Relation Age of Onset  . Atrial fibrillation Mother   . Arrhythmia Mother   . Diabetes Mother   . Hodgkin's lymphoma Father     Allergies  Allergen Reactions  . Jardiance [Empagliflozin]     Causes heart palpitations    Current Outpatient Prescriptions on File Prior to Visit  Medication Sig Dispense Refill  . aspirin 81 MG tablet Take 81 mg by mouth daily.      Marland Kitchen Bioflavonoid Products (ESTER C PO) Take 1 tablet by mouth daily.      . Calcium Carbonate-Vitamin D (CALTRATE 600+D PO) Take 1 tablet by mouth 2 (two) times daily.      .  carvedilol (COREG) 12.5 MG tablet Take 1 tablet (12.5 mg total) by mouth 2 (two) times daily.  180 tablet  3  . flecainide (TAMBOCOR) 50 MG tablet Take 1 tablet (50 mg total) by mouth 2 (two) times daily.  180 tablet  3  . glimepiride (AMARYL) 4 MG tablet Take 1 tablet (4 mg total) by mouth daily before breakfast.  30 tablet  3  . lisinopril-hydrochlorothiazide (PRINZIDE,ZESTORETIC) 20-25 MG per tablet Take 1 tablet by mouth daily.  30 tablet  3  . metFORMIN (GLUCOPHAGE) 500 MG tablet Take 1,000 mg by mouth 2 (two) times daily with a meal.      . Omega-3 Fatty Acids (FISH OIL) 1200 MG CAPS Take 1,200 mg by mouth daily.        No current facility-administered medications on file prior to visit.    BP 130/85  Pulse 87  Temp(Src) 98 F (36.7 C) (Oral)  Resp 16  Ht 5' 4.5" (1.638 m)  Wt 156 lb 0.6 oz (70.779 kg)  BMI 26.38 kg/m2  SpO2 99%       Objective:   Physical Exam  Constitutional: She is  oriented to person, place, and time. She appears well-developed and well-nourished. No distress.  Cardiovascular: Normal rate and regular rhythm.   No murmur heard. Pulmonary/Chest: Effort normal and breath sounds normal. No respiratory distress. She has no wheezes. She has no rales. She exhibits no tenderness.  Abdominal: Soft. Bowel sounds are normal. She exhibits no distension. There is no rebound and no guarding.  Mild suprapubic tenderness to palpation  Genitourinary:  Neg CVAT bilaterally  Neurological: She is alert and oriented to person, place, and time.  Psychiatric: She has a normal mood and affect. Her behavior is normal. Judgment and thought content normal.          Assessment & Plan:

## 2013-08-06 NOTE — Patient Instructions (Signed)
Start Cipro, call if symptoms worsen or if not improved in 2-3 days.  Urinary Tract Infection A urinary tract infection (UTI) can occur any place along the urinary tract. The tract includes the kidneys, ureters, bladder, and urethra. A type of germ called bacteria often causes a UTI. UTIs are often helped with antibiotic medicine.  HOME CARE   If given, take antibiotics as told by your doctor. Finish them even if you start to feel better.  Drink enough fluids to keep your pee (urine) clear or pale yellow.  Avoid tea, drinks with caffeine, and bubbly (carbonated) drinks.  Pee often. Avoid holding your pee in for a long time.  Pee before and after having sex (intercourse).  Wipe from front to back after you poop (bowel movement) if you are a woman. Use each tissue only once. GET HELP RIGHT AWAY IF:   You have back pain.  You have lower belly (abdominal) pain.  You have chills.  You feel sick to your stomach (nauseous).  You throw up (vomit).  Your burning or discomfort with peeing does not go away.  You have a fever.  Your symptoms are not better in 3 days. MAKE SURE YOU:   Understand these instructions.  Will watch your condition.  Will get help right away if you are not doing well or get worse. Document Released: 08/16/2007 Document Revised: 11/22/2011 Document Reviewed: 09/28/2011 [facility] Patient Information 2014 [place], [place].

## 2013-08-06 NOTE — Assessment & Plan Note (Signed)
UA notes + leuks, + nitrites, + blood. Will initiate cipro, send urine for C and S.

## 2013-08-08 ENCOUNTER — Telehealth: Payer: Self-pay | Admitting: Family

## 2013-08-08 MED ORDER — PIOGLITAZONE HCL 30 MG PO TABS: 30.0000 mg | ORAL_TABLET | Freq: Every day | ORAL | Status: DC

## 2013-08-08 NOTE — Telephone Encounter (Signed)
Notified pt and she voices understanding. 

## 2013-08-08 NOTE — Telephone Encounter (Signed)
Urine culture confirms UTI.  Continue cipro.  Diabetes remains uncontrolled.   Add Actos (generic) she can print off coupon on GoodRx.com for actos and should be able to purchase for 14$ at [place].

## 2013-08-09 LAB — URINE CULTURE: Colony Count: >=100000

## 2013-09-03 ENCOUNTER — Telehealth: Payer: Self-pay | Admitting: Family

## 2013-09-03 NOTE — Telephone Encounter (Signed)
Left message for patient to return my call.

## 2013-09-03 NOTE — Telephone Encounter (Signed)
UTi symptoms are back, can something be called into [facility] on [address]. Please advise.

## 2013-09-03 NOTE — Telephone Encounter (Signed)
We should see her back in the office. Since it has been 1 month we will need to reculture her urine.

## 2013-09-03 NOTE — Telephone Encounter (Signed)
Please schedule

## 2013-09-05 NOTE — Telephone Encounter (Signed)
Patient returned phone call. Patient refused to come back in to be seen stating that she does not have the money.

## 2013-09-10 ENCOUNTER — Other Ambulatory Visit: Payer: Self-pay | Admitting: Family

## 2013-10-15 ENCOUNTER — Other Ambulatory Visit: Payer: Self-pay | Admitting: Family

## 2013-11-16 ENCOUNTER — Other Ambulatory Visit: Payer: Self-pay | Admitting: Family

## 2013-11-18 NOTE — Telephone Encounter (Signed)
Metformin refill sent to pharmacy. Pt is due for follow up now. Please call pt to arrange appointment.

## 2013-12-03 ENCOUNTER — Encounter: Payer: Self-pay | Admitting: Interventional Cardiology

## 2013-12-03 ENCOUNTER — Ambulatory Visit (INDEPENDENT_AMBULATORY_CARE_PROVIDER_SITE_OTHER): Payer: Self-pay | Admitting: Interventional Cardiology

## 2013-12-03 VITALS — BP 158/86 | HR 87 | Ht 64.0 in | Wt 149.1 lb

## 2013-12-03 DIAGNOSIS — I10 Essential (primary) hypertension: Secondary | ICD-10-CM

## 2013-12-03 DIAGNOSIS — I483 Typical atrial flutter: Secondary | ICD-10-CM

## 2013-12-03 DIAGNOSIS — F17200 Nicotine dependence, unspecified, uncomplicated: Secondary | ICD-10-CM

## 2013-12-03 DIAGNOSIS — I4892 Unspecified atrial flutter: Secondary | ICD-10-CM

## 2013-12-03 MED ORDER — AMLODIPINE BESYLATE 10 MG PO TABS: 5.0000 mg | ORAL_TABLET | Freq: Every day | ORAL | Status: DC

## 2013-12-03 NOTE — Progress Notes (Signed)
[name], female   DOB: 1955/03/25, 58 y.o.   MRN: [id number] [name], female   DOB: 1955/11/30, 58 y.o.   MRN: [id number] [name], female   DOB: 1955-10-16, 58 y.o.   MRN: [id number]     [name] MRN: [id number] DOB/AGE: [birth year] 58 y.o.   Referring Physician Dr. Alroy Dust   Reason for Consultation palpitations  HPI: 58 y/o who has had symptomatic atrial flutter.  She was hospitalized in October 2014 for atrial flutter. She spontaneously converted to normal sinus rhythm. She received digoxin, diltiazem, metoprolol and subsequently amiodarone. All this helped her convert. She had recurrent atrial flutter trated with ablation.  Amiodarone and diltiazem were stopped. Digoxin was stopped as well.  She had recurrent palpitations and monitor showed PACs, PVCs, atrial tach.  Flecainide and beta blocker were started.  Her sx resolved.  Her BP is still high in the mornings.    She has increased her coreg to 12.5 mg BID.  BP still high, up to 681 systolic yesterday in the morning; 150s in the evening.  Still smoking.  Walking more.      Current Outpatient Prescriptions  Medication Sig Dispense Refill  . aspirin 81 MG tablet Take 81 mg by mouth daily.      Marland Kitchen Bioflavonoid Products (ESTER C PO) Take 1 tablet by mouth daily.      . Calcium Carbonate-Vitamin D (CALTRATE 600+D PO) Take 1 tablet by mouth 2 (two) times daily.      . carvedilol (COREG) 12.5 MG tablet Take 1 tablet (12.5 mg total) by mouth 2 (two) times daily.  180 tablet  3  . flecainide (TAMBOCOR) 50 MG tablet Take 1 tablet (50 mg total) by mouth 2 (two) times daily.  180 tablet  3  . glimepiride (AMARYL) 4 MG tablet TAKE ONE TABLET BY MOUTH ONCE DAILY BEFORE BREAKFAST  30 tablet  2  . lisinopril-hydrochlorothiazide (PRINZIDE,ZESTORETIC) 20-25 MG per tablet TAKE ONE TABLET BY MOUTH ONCE DAILY  30 tablet  2  . metFORMIN (GLUCOPHAGE) 500 MG tablet TAKE ONE TABLET  BY MOUTH TWICE DAILY WITH MEALS FOR 1 WEEK,IF TOLERATED INCREASE TO 2 TABS TWICE DAILY  120 tablet  1  . Omega-3 Fatty Acids (FISH OIL) 1200 MG CAPS Take 1,200 mg by mouth daily.        No current facility-administered medications for this visit.   Past Medical History  Diagnosis Date  . Hypertension   . Diabetes mellitus without complication   . Chest pain   . Atrial flutter     s/p CTI by Dr Lovena Le 02/2013    Family History  Problem Relation Age of Onset  . Atrial fibrillation Mother   . Arrhythmia Mother   . Diabetes Mother   . Hodgkin's lymphoma Father     History   Social History  . Marital Status: Married    Spouse Name: N/A    Number of Children: N/A  . Years of Education: N/A   Occupational History  . Not on file.   Social History Main Topics  . Smoking status: Former Research scientist (life sciences)  . Smokeless tobacco: Not on file  . Alcohol Use: No  . Drug Use: No  . Sexual Activity: Not on file   Other Topics Concern  . Not on file   Social History Narrative   Married   Clinical cytogeneticist- full time   Daughter- 2 grandchildren, live in [place]  Enjoys playing on computer.      Past Surgical History  Procedure Laterality Date  . Ablation  03-04-2013    CTI by Dr Lovena Le for atrial flutter  . Tonsillectomy  1972 ?  Marland Kitchen Cholecystectomy  1982      (Not in a hospital admission)  Review of systems complete and found to be negative unless listed above .  No nausea, vomiting.  No fever chills, No focal weakness,  No palpitations.  Physical Exam: Filed Vitals:   12/03/13 0849  BP: 158/86  Pulse: 87    Weight: 149 lb 1.9 oz (67.64 kg)  Physical exam:  [place]/AT EOMI No JVD, No carotid bruit Tachycardic S1S2  No wheezing Soft. NT, nondistended No edema. No focal motor or sensory deficits Normal affect  Labs:   Lab Results  Component Value Date   WBC 10.6* 02/25/2013   HGB 16.1* 02/25/2013   HCT 46.4* 02/25/2013   MCV 87.0 02/25/2013   PLT 213.0 02/25/2013   No results  found for this basename: NA, K, CL, CO2, BUN, CREATININE, CALCIUM, LABALBU, PROT, BILITOT, ALKPHOS, ALT, AST, GLUCOSE,  in the last 168 hours Lab Results  Component Value Date   CKTOTAL 31 09/24/2008   CKMB 0.7 09/24/2008   TROPONINI <0.30 12/22/2012    Lab Results  Component Value Date   CHOL 153 08/06/2013   Lab Results  Component Value Date   HDL 36* 08/06/2013   Lab Results  Component Value Date   LDLCALC 78 08/06/2013   Lab Results  Component Value Date   TRIG 193* 08/06/2013   Lab Results  Component Value Date   CHOLHDL 4.3 08/06/2013   No results found for this basename: LDLDIRECT      Radiology: Echo in October showed normal left  function with normal valvular function. EKG: Atrial flutter with 2:1 conduction pre-ablation  ASSESSMENT AND PLAN:  Atrial flutter: Was symptomatic.  Now off Xarelto, post  TEE/ablation. Continue Flecainide and Coreg or PACs, PVCs and atrial tachycardia.  Sx controlled.  ETT for flecainide was negative.  SHe is walking a mile a day at work.   Fatigue: Improved post TEE/ablation.    Hypertension: Early morning readings have been high, up to 902-409 systolic. Start amlodipine 5 mg PO qHS.  Increase to 10 mg as needed. If morning blood pressure readings remain high, could increase the evening dose of carvedilol to 25 mg while keeping the a.m. dose to 12.5 mg.   Smoking:  Trying to quit.  She is tapering off herself.  She has tried patches in the past without success.   Signed:   Mina Marble, MD, [facility] 12/03/2013, 8:59 AM

## 2013-12-03 NOTE — Patient Instructions (Addendum)
Your physician has recommended you make the following change in your medication:   1. Start Amlodipine 10 mg 1/2 tablet daily at night  Your physician has requested that you regularly monitor and record your blood pressure readings at home. Please use the same machine at the same time of day to check your readings and record them. Call next week with BP readings.   Your physician wants you to follow-up in: 1 year with Dr. Irish Lack. You will receive a reminder letter in the mail two months in advance. If you don't receive a letter, please call our office to schedule the follow-up appointment.

## 2013-12-12 ENCOUNTER — Other Ambulatory Visit: Payer: Self-pay | Admitting: Family

## 2013-12-12 NOTE — Telephone Encounter (Signed)
Rx request to pharmacy/SLS  

## 2013-12-18 ENCOUNTER — Telehealth: Payer: Self-pay | Admitting: Family

## 2013-12-18 NOTE — Telephone Encounter (Signed)
Caller name: Micaylah Relation to pt: self Call back number: [phone] Pharmacy: [facility] on [address] in [place]  Reason for call:   Patient states that she called the pharmacy for a refill last week. Patient is now out and needs a refill. Thanks!

## 2013-12-19 MED ORDER — LISINOPRIL-HYDROCHLOROTHIAZIDE 20-25 MG PO TABS: ORAL_TABLET | ORAL | Status: DC

## 2013-12-19 NOTE — Telephone Encounter (Signed)
Pt returned my call and states she has been out of lisinopril x 1 week. Sent refill and scheduled pt f/u for 12/29/13 at 3:30pm.

## 2013-12-19 NOTE — Telephone Encounter (Signed)
Left message on cell# that Amaryl was refilled on 12/12/13 and to let us know if she is needing a different medication. Also pt is past due for follow up and advised her to call and schedule appt.

## 2013-12-29 ENCOUNTER — Ambulatory Visit: Payer: Self-pay | Admitting: Family

## 2013-12-29 ENCOUNTER — Telehealth: Payer: Self-pay | Admitting: *Deleted

## 2013-12-29 NOTE — Telephone Encounter (Signed)
Pt did not show for appointment 12/29/2013 at 3:30 for 3 month f/u--200

## 2013-12-29 NOTE — Telephone Encounter (Signed)
Noted. Pt called earlier to cancel. Do not charge no show fee.

## 2013-12-29 NOTE — Telephone Encounter (Signed)
See notes below

## 2014-01-15 ENCOUNTER — Telehealth: Payer: Self-pay | Admitting: Family

## 2014-01-15 DIAGNOSIS — E876 Hypokalemia: Secondary | ICD-10-CM

## 2014-01-16 NOTE — Telephone Encounter (Signed)
Pt did not keep recent appt. 2 week supply lisinopril hct sent to pharmacy. Pt needs appt before this supply runs out.  Please call pt to arrange appt.

## 2014-01-26 ENCOUNTER — Ambulatory Visit (INDEPENDENT_AMBULATORY_CARE_PROVIDER_SITE_OTHER): Payer: Self-pay | Admitting: Family

## 2014-01-26 ENCOUNTER — Encounter: Payer: Self-pay | Admitting: Family

## 2014-01-26 VITALS — BP 136/70 | HR 80 | Temp 98.0°F | Resp 16 | Ht 64.5 in | Wt 146.6 lb

## 2014-01-26 DIAGNOSIS — I10 Essential (primary) hypertension: Secondary | ICD-10-CM

## 2014-01-26 DIAGNOSIS — Z720 Tobacco use: Secondary | ICD-10-CM

## 2014-01-26 DIAGNOSIS — IMO0002 Reserved for concepts with insufficient information to code with codable children: Secondary | ICD-10-CM

## 2014-01-26 DIAGNOSIS — E1165 Type 2 diabetes mellitus with hyperglycemia: Secondary | ICD-10-CM

## 2014-01-26 DIAGNOSIS — F17200 Nicotine dependence, unspecified, uncomplicated: Secondary | ICD-10-CM

## 2014-01-26 NOTE — Assessment & Plan Note (Signed)
BP stable on current meds. Obtain bmet.

## 2014-01-26 NOTE — Assessment & Plan Note (Signed)
Counseled patient on importance of quitting smoking.

## 2014-01-26 NOTE — Progress Notes (Signed)
Pre visit review using our clinic review tool, if applicable. No additional management support is needed unless otherwise documented below in the visit note. 

## 2014-01-26 NOTE — Progress Notes (Signed)
Subjective:    [name], female    DOB: 07-19-1955, 58 y.o.   MRN: [id number]  HPI  Christy Abbott is a 58 yr old female who presents today for follow up.  1) HTN- current BP meds include amlodipine, lisinopril-hctz and carvedilol.  BP Readings from Last 3 Encounters:  01/26/14 136/70  12/03/13 158/86  08/06/13 130/85    2) DM2-  Current meds include amaryl and metformin 1000 bid. Diet is fair. Reports that her sugar at home post prandial yesterday was 159.  Reports sugars <200. Denies hypoglycemia.  Last eye exam was <1 yr ago and she reports no retinopathy.   Lab Results  Component Value Date   HGBA1C 9.3* 08/06/2013   HGBA1C 9.7* 04/21/2013   HGBA1C 11.2* 12/21/2012   Lab Results  Component Value Date   LDLCALC 78 08/06/2013   CREATININE 0.64 08/06/2013   3) Tobacco abuse- 1PPD.    She tells me that derm is giving her keflex for "bacterial rash" but that she is going to seek a second opinion as it is not improving.   Review of Systems See HPI  Past Medical History  Diagnosis Date  . Hypertension   . Diabetes mellitus without complication   . Chest pain   . Atrial flutter     s/p CTI by Dr Lovena Le 02/2013    History   Social History  . Marital Status: Married    Spouse Name: N/A    Number of Children: N/A  . Years of Education: N/A   Occupational History  . Not on file.   Social History Main Topics  . Smoking status: Former Research scientist (life sciences)  . Smokeless tobacco: Not on file  . Alcohol Use: No  . Drug Use: No  . Sexual Activity: Not on file   Other Topics Concern  . 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      Past Surgical History  Procedure Laterality Date  . Ablation  03-04-2013    CTI by Dr Lovena Le for atrial flutter  . Tonsillectomy  1972 ?  Marland Kitchen Cholecystectomy  1982    Family History  Problem Relation Age of Onset  .  Atrial fibrillation Mother   . Arrhythmia Mother   . Diabetes Mother   . Hodgkin's lymphoma Father     Allergies  Allergen Reactions  . Jardiance [Empagliflozin]     Causes heart palpitations    Current Outpatient Prescriptions on File Prior to Visit  Medication Sig Dispense Refill  . amLODipine (NORVASC) 10 MG tablet Take 0.5 tablets (5 mg total) by mouth daily. Or as directed 30 tablet 6  . aspirin 81 MG tablet Take 81 mg by mouth daily.    Marland Kitchen Bioflavonoid Products (ESTER C PO) Take 1 tablet by mouth daily.    . Calcium Carbonate-Vitamin D (CALTRATE 600+D PO) Take 1 tablet by mouth 2 (two) times daily.    . carvedilol (COREG) 12.5 MG tablet Take 1 tablet (12.5 mg total) by mouth 2 (two) times daily. 180 tablet 3  . flecainide (TAMBOCOR) 50 MG tablet Take 1 tablet (50 mg total) by mouth 2 (two) times daily. 180 tablet 3  . glimepiride (AMARYL) 4 MG tablet TAKE ONE TABLET BY MOUTH ONCE DAILY BEFORE BREAKFAST 30 tablet 0  . lisinopril-hydrochlorothiazide (PRINZIDE,ZESTORETIC) 20-25 MG per tablet TAKE ONE TABLET BY  MOUTH ONCE DAILY 14 tablet 0  . metFORMIN (GLUCOPHAGE) 500 MG tablet TAKE ONE TABLET BY MOUTH TWICE DAILY WITH MEALS FOR 1 WEEK,IF TOLERATED INCREASE TO 2 TABS TWICE DAILY 120 tablet 1  . Omega-3 Fatty Acids (FISH OIL) 1200 MG CAPS Take 1,200 mg by mouth daily.      No current facility-administered medications on file prior to visit.    BP 136/70 mmHg  Pulse 80  Temp(Src) 98 F (36.7 C) (Oral)  Resp 16  Ht 5' 4.5" (1.638 m)  Wt 146 lb 9.6 oz (66.497 kg)  BMI 24.78 kg/m2  SpO2 95%       Objective:   Physical Exam  Constitutional: She is oriented to person, place, and time. She appears well-developed and well-nourished. No distress.  Cardiovascular: Normal rate and regular rhythm.   No murmur heard. Pulmonary/Chest: Effort normal and breath sounds normal. No respiratory distress. She has no wheezes. She has no rales. She exhibits no tenderness.  Musculoskeletal:  She exhibits no edema.  Neurological: She is alert and oriented to person, place, and time.  Skin:  Dry scaly rash posterior scalp.   Psychiatric: She has a normal mood and affect. Her behavior is normal. Judgment and thought content normal.          Assessment & Plan:

## 2014-01-26 NOTE — Assessment & Plan Note (Signed)
Obtain A1c, bmet, continue current meds. Urged compliance with diet/follow up. Pt will be obtaining insurance in 2016. She will get flu shot at [facility].

## 2014-01-26 NOTE — Patient Instructions (Signed)
Please complete lab work prior to leaving. Work hard on [employer], and quitting smoking.  Follow up in 3 months.

## 2014-01-27 LAB — BASIC METABOLIC PANEL
BUN: 17 mg/dL (ref 6–23)
CO2: 28 mEq/L (ref 19–32)
CREATININE: 0.5 mg/dL (ref 0.4–1.2)
Calcium: 10.1 mg/dL (ref 8.4–10.5)
Chloride: 100 mEq/L (ref 96–112)
GFR: 123.01 mL/min (ref >60.00)
Glucose, Bld: 104 mg/dL — ABNORMAL HIGH (ref 70–99)
Potassium: 3.2 mEq/L — ABNORMAL LOW (ref 3.5–5.1)
Sodium: 136 mEq/L (ref 135–145)

## 2014-01-27 LAB — HEMOGLOBIN A1C: HEMOGLOBIN A1C: 6.4 % (ref 4.6–6.5)

## 2014-01-29 MED ORDER — POTASSIUM CHLORIDE CRYS ER 20 MEQ PO TBCR: 20.0000 meq | EXTENDED_RELEASE_TABLET | Freq: Every day | ORAL | Status: DC

## 2014-01-29 NOTE — Telephone Encounter (Signed)
Sugar control now looks great.  Potassium is low. Add potassium supplement once daily. Repeat bmet in 1 week, dx hypokalemia

## 2014-01-30 MED ORDER — METFORMIN HCL 500 MG PO TABS: ORAL_TABLET | ORAL | Status: DC

## 2014-01-30 MED ORDER — GLIMEPIRIDE 4 MG PO TABS: ORAL_TABLET | ORAL | Status: DC

## 2014-01-30 NOTE — Telephone Encounter (Signed)
Pt called and was notified of below instructions and voices understanding. Lab appt scheduled for 02/09/14 at 3:45pm. Future order placed. Pt requests refills of metformin and amaryl. Refill sent.

## 2014-01-30 NOTE — Telephone Encounter (Signed)
Left message with pt's husband to have pt return my call.

## 2014-02-02 ENCOUNTER — Other Ambulatory Visit: Payer: Self-pay | Admitting: Family

## 2014-02-02 NOTE — Telephone Encounter (Signed)
Medication Detail      Disp Refills Start End     lisinopril-hydrochlorothiazide (PRINZIDE,ZESTORETIC) 20-25 MG per tablet 14 tablet 0 01/16/2014     Sig: TAKE ONE TABLET BY MOUTH ONCE DAILY    E-Prescribing Status: Receipt confirmed by pharmacy (01/16/2014 2:37 PM EST)    Rx request to pharmacy/SLS

## 2014-02-09 ENCOUNTER — Other Ambulatory Visit (INDEPENDENT_AMBULATORY_CARE_PROVIDER_SITE_OTHER): Payer: Self-pay

## 2014-02-09 DIAGNOSIS — E876 Hypokalemia: Secondary | ICD-10-CM

## 2014-02-11 ENCOUNTER — Encounter: Payer: Self-pay | Admitting: Family

## 2014-02-11 LAB — BASIC METABOLIC PANEL
BUN: 11 mg/dL (ref 6–23)
CALCIUM: 9.7 mg/dL (ref 8.4–10.5)
CO2: 21 mEq/L (ref 19–32)
CREATININE: 0.6 mg/dL (ref 0.4–1.2)
Chloride: 103 mEq/L (ref 96–112)
GFR: 113.26 mL/min (ref >60.00)
Glucose, Bld: 93 mg/dL (ref 70–99)
Potassium: 3.7 mEq/L (ref 3.5–5.1)
SODIUM: 138 meq/L (ref 135–145)

## 2014-02-13 MED ORDER — POTASSIUM CHLORIDE CRYS ER 20 MEQ PO TBCR: 20.0000 meq | EXTENDED_RELEASE_TABLET | Freq: Every day | ORAL | Status: DC

## 2014-02-19 ENCOUNTER — Encounter (HOSPITAL_COMMUNITY): Payer: Self-pay | Admitting: Internal Medicine

## 2014-03-26 ENCOUNTER — Encounter (HOSPITAL_COMMUNITY): Payer: Self-pay | Admitting: Internal Medicine

## 2014-04-10 ENCOUNTER — Other Ambulatory Visit: Payer: Self-pay | Admitting: Family

## 2014-04-10 NOTE — Telephone Encounter (Signed)
Rx request to pharmacy/SLS  

## 2014-05-12 DIAGNOSIS — D490 Neoplasm of unspecified behavior of digestive system: Secondary | ICD-10-CM

## 2014-05-12 HISTORY — DX: Neoplasm of unspecified behavior of digestive system: D49.0

## 2014-05-19 ENCOUNTER — Inpatient Hospital Stay (HOSPITAL_BASED_OUTPATIENT_CLINIC_OR_DEPARTMENT_OTHER)
Admission: EM | Admit: 2014-05-19 | Discharge: 2014-05-22 | DRG: 393 | Disposition: A | Discharge status: Home / Self Care | Payer: Self-pay | Attending: Internal Medicine | Admitting: Internal Medicine

## 2014-05-19 ENCOUNTER — Other Ambulatory Visit (HOSPITAL_BASED_OUTPATIENT_CLINIC_OR_DEPARTMENT_OTHER): Payer: Self-pay

## 2014-05-19 ENCOUNTER — Other Ambulatory Visit: Payer: Self-pay

## 2014-05-19 ENCOUNTER — Encounter (HOSPITAL_BASED_OUTPATIENT_CLINIC_OR_DEPARTMENT_OTHER): Payer: Self-pay

## 2014-05-19 ENCOUNTER — Emergency Department (HOSPITAL_BASED_OUTPATIENT_CLINIC_OR_DEPARTMENT_OTHER): Payer: Self-pay

## 2014-05-19 ENCOUNTER — Encounter: Payer: Self-pay | Admitting: Family

## 2014-05-19 ENCOUNTER — Ambulatory Visit (INDEPENDENT_AMBULATORY_CARE_PROVIDER_SITE_OTHER): Payer: Self-pay | Admitting: Family

## 2014-05-19 VITALS — BP 97/58 | HR 131 | Temp 97.5°F | Resp 24 | Ht 64.5 in | Wt 138.4 lb

## 2014-05-19 DIAGNOSIS — K254 Chronic or unspecified gastric ulcer with hemorrhage: Secondary | ICD-10-CM | POA: Diagnosis present

## 2014-05-19 DIAGNOSIS — K529 Noninfective gastroenteritis and colitis, unspecified: Secondary | ICD-10-CM

## 2014-05-19 DIAGNOSIS — E119 Type 2 diabetes mellitus without complications: Secondary | ICD-10-CM | POA: Diagnosis present

## 2014-05-19 DIAGNOSIS — I4892 Unspecified atrial flutter: Secondary | ICD-10-CM | POA: Diagnosis present

## 2014-05-19 DIAGNOSIS — Z7982 Long term (current) use of aspirin: Secondary | ICD-10-CM

## 2014-05-19 DIAGNOSIS — Z87891 Personal history of nicotine dependence: Secondary | ICD-10-CM

## 2014-05-19 DIAGNOSIS — K922 Gastrointestinal hemorrhage, unspecified: Secondary | ICD-10-CM | POA: Diagnosis present

## 2014-05-19 DIAGNOSIS — Z833 Family history of diabetes mellitus: Secondary | ICD-10-CM

## 2014-05-19 DIAGNOSIS — E876 Hypokalemia: Secondary | ICD-10-CM | POA: Diagnosis present

## 2014-05-19 DIAGNOSIS — Z807 Family history of other malignant neoplasms of lymphoid, hematopoietic and related tissues: Secondary | ICD-10-CM

## 2014-05-19 DIAGNOSIS — I10 Essential (primary) hypertension: Secondary | ICD-10-CM | POA: Diagnosis present

## 2014-05-19 DIAGNOSIS — D62 Acute posthemorrhagic anemia: Secondary | ICD-10-CM | POA: Diagnosis present

## 2014-05-19 DIAGNOSIS — E1165 Type 2 diabetes mellitus with hyperglycemia: Secondary | ICD-10-CM | POA: Diagnosis present

## 2014-05-19 DIAGNOSIS — K449 Diaphragmatic hernia without obstruction or gangrene: Secondary | ICD-10-CM | POA: Diagnosis present

## 2014-05-19 DIAGNOSIS — Z888 Allergy status to other drugs, medicaments and biological substances status: Secondary | ICD-10-CM

## 2014-05-19 DIAGNOSIS — Z79899 Other long term (current) drug therapy: Secondary | ICD-10-CM

## 2014-05-19 DIAGNOSIS — K2960 Other gastritis without bleeding: Secondary | ICD-10-CM | POA: Diagnosis present

## 2014-05-19 DIAGNOSIS — D3A092 Benign carcinoid tumor of the stomach: Principal | ICD-10-CM | POA: Diagnosis present

## 2014-05-19 DIAGNOSIS — K2980 Duodenitis without bleeding: Secondary | ICD-10-CM | POA: Diagnosis present

## 2014-05-19 DIAGNOSIS — R000 Tachycardia, unspecified: Secondary | ICD-10-CM

## 2014-05-19 DIAGNOSIS — R609 Edema, unspecified: Secondary | ICD-10-CM | POA: Diagnosis present

## 2014-05-19 LAB — CBC WITH DIFFERENTIAL/PLATELET
BAND NEUTROPHILS: 2 % (ref 0–10)
BASOS ABS: 0 10*3/uL (ref 0.0–0.1)
BASOS PCT: 0 % (ref 0–1)
Blasts: 0 %
Eosinophils Absolute: 0 10*3/uL (ref 0.0–0.7)
Eosinophils Relative: 0 % (ref 0–5)
HEMATOCRIT: 44.3 % (ref 36.0–46.0)
Hemoglobin: 15.8 g/dL — ABNORMAL HIGH (ref 12.0–15.0)
LYMPHS ABS: 2.5 10*3/uL (ref 0.7–4.0)
Lymphocytes Relative: 40 % (ref 12–46)
MCH: 30.5 pg (ref 26.0–34.0)
MCHC: 35.7 g/dL (ref 30.0–36.0)
MCV: 85.5 fL (ref 78.0–100.0)
MONO ABS: 0.1 10*3/uL (ref 0.1–1.0)
MONOS PCT: 2 % — AB (ref 3–12)
Metamyelocytes Relative: 0 %
Myelocytes: 0 %
Neutro Abs: 3.6 10*3/uL (ref 1.7–7.7)
Neutrophils Relative %: 56 % (ref 43–77)
PROMYELOCYTES ABS: 0 %
Platelets: 133 10*3/uL — ABNORMAL LOW (ref 150–400)
RBC: 5.18 MIL/uL — ABNORMAL HIGH (ref 3.87–5.11)
RDW: 12.9 % (ref 11.5–15.5)
WBC: 6.2 10*3/uL (ref 4.0–10.5)
nRBC: 0 /100 WBC

## 2014-05-19 LAB — COMPREHENSIVE METABOLIC PANEL
ALT: 21 U/L (ref 0–35)
ANION GAP: 7 (ref 5–15)
AST: 30 U/L (ref 0–37)
Albumin: 3.6 g/dL (ref 3.5–5.2)
Alkaline Phosphatase: 95 U/L (ref 39–117)
BUN: 68 mg/dL — ABNORMAL HIGH (ref 6–23)
CALCIUM: 8.5 mg/dL (ref 8.4–10.5)
CO2: 26 mmol/L (ref 19–32)
CREATININE: 0.82 mg/dL (ref 0.50–1.10)
Chloride: 96 mmol/L (ref 96–112)
GFR, EST AFRICAN AMERICAN: 90 mL/min — AB (ref >90)
GFR, EST NON AFRICAN AMERICAN: 77 mL/min — AB (ref >90)
GLUCOSE: 338 mg/dL — AB (ref 70–99)
POTASSIUM: 2.9 mmol/L — AB (ref 3.5–5.1)
SODIUM: 129 mmol/L — AB (ref 135–145)
Total Bilirubin: 0.8 mg/dL (ref 0.3–1.2)
Total Protein: 6.9 g/dL (ref 6.0–8.3)

## 2014-05-19 LAB — PROTIME-INR
INR: 0.95 (ref 0.00–1.49)
Prothrombin Time: 12.7 seconds (ref 11.6–15.2)

## 2014-05-19 LAB — I-STAT CG4 LACTIC ACID, ED: LACTIC ACID, VENOUS: 2.6 mmol/L — AB (ref 0.5–2.0)

## 2014-05-19 LAB — TROPONIN I: Troponin I: <0.03 ng/mL (ref <0.031)

## 2014-05-19 LAB — OCCULT BLOOD X 1 CARD TO LAB, STOOL: Fecal Occult Bld: POSITIVE — AB

## 2014-05-19 LAB — APTT: APTT: 27 s (ref 24–37)

## 2014-05-19 IMAGING — CR DG CHEST 1V PORT
1 series · 1 of 1 positions shown · non-contrast
Comparison: [DATE] and earlier.

CLINICAL DATA: 58-year-old female with weakness nausea and
vomiting. Initial encounter.

EXAM:
PORTABLE CHEST - 1 VIEW

[view not recorded]
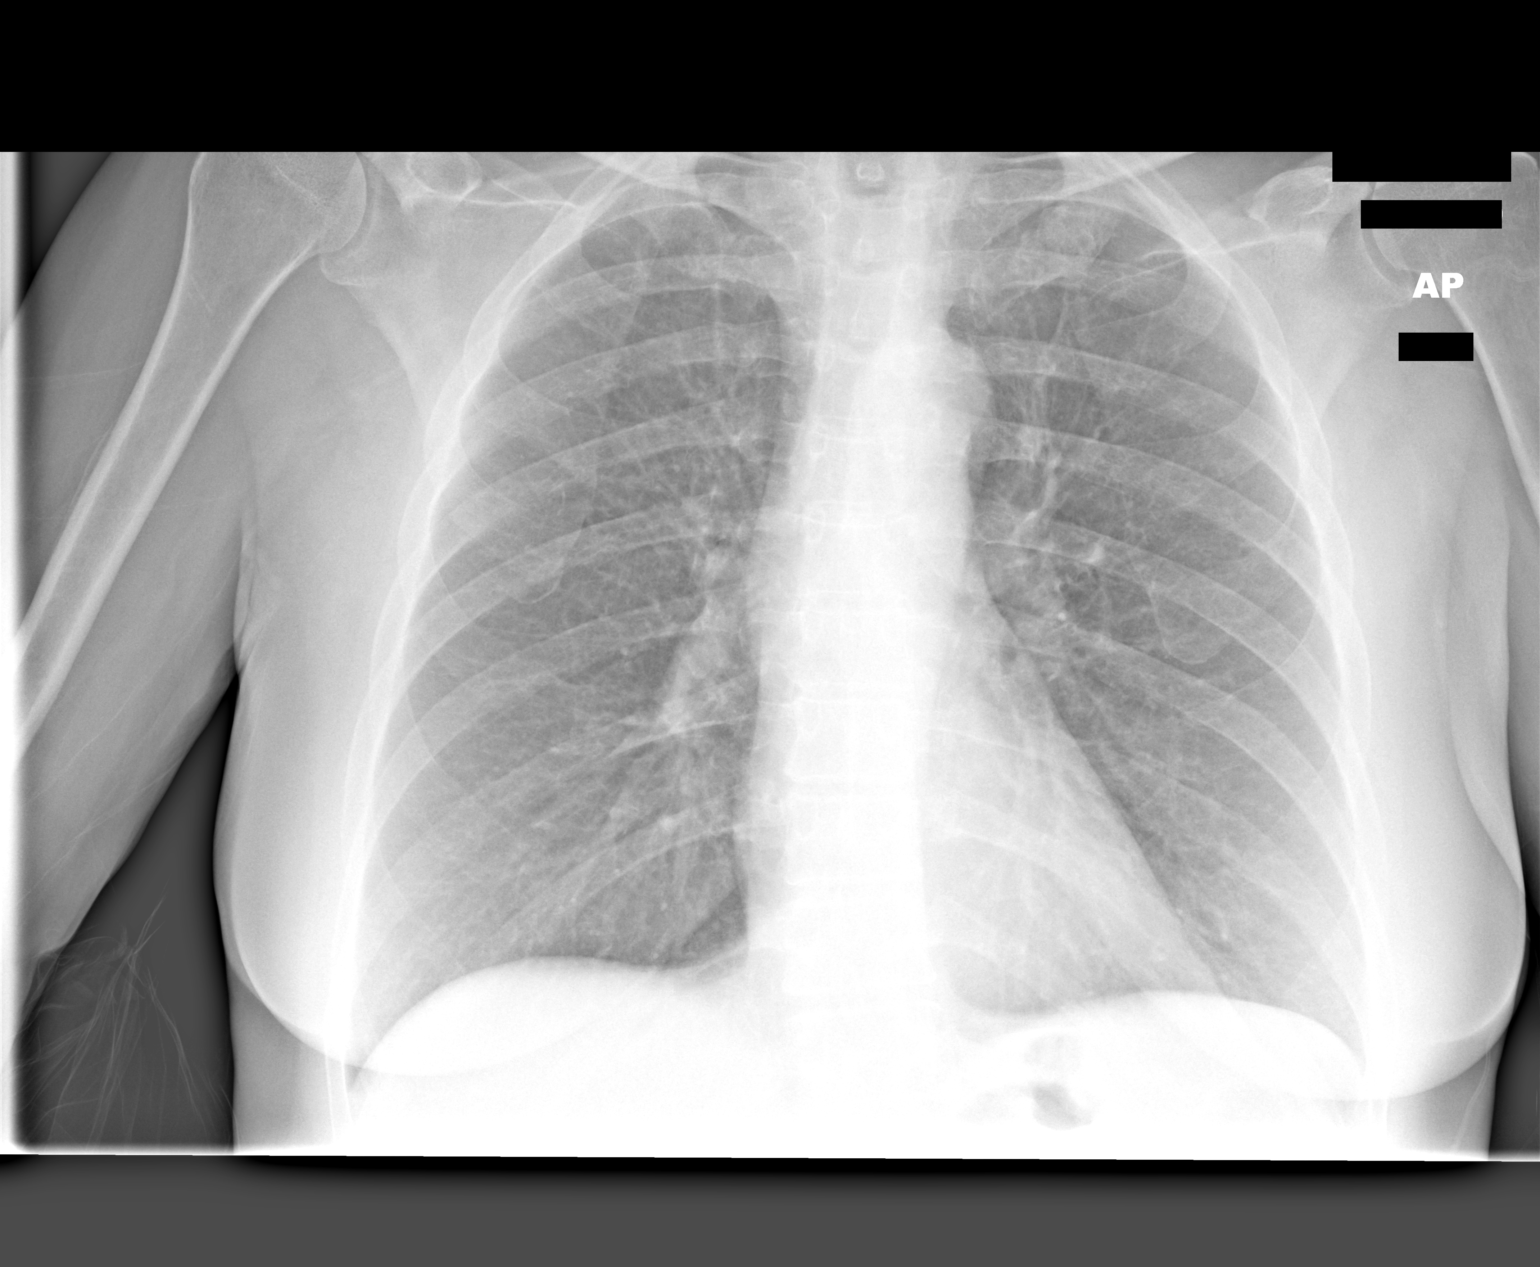

[1 of 1 positions shown; findings below may reference images not displayed]

FINDINGS: Portable AP upright view at [Z3] hrs. Normal lung volumes. Normal
cardiac size and mediastinal contours. No pneumothorax or
pneumoperitoneum. Allowing for portable technique, the lungs are
clear. Visualized tracheal air column is within normal limits.
IMPRESSION: Negative, no acute cardiopulmonary abnormality.

## 2014-05-19 MED ORDER — SODIUM CHLORIDE 0.9 % IV BOLUS (SEPSIS)
1000.0000 mL | Freq: Once | INTRAVENOUS | Status: AC
  Administered 2014-05-19: 1000 mL via INTRAVENOUS

## 2014-05-19 MED ORDER — POTASSIUM CHLORIDE 10 MEQ/100ML IV SOLN
10.0000 meq | INTRAVENOUS | Status: AC
  Administered 2014-05-19: 10 meq via INTRAVENOUS
  Filled 2014-05-19: qty 100

## 2014-05-19 MED ORDER — SODIUM CHLORIDE 0.9 % IV SOLN: 80.0000 mg | Freq: Once | INTRAVENOUS | Status: DC

## 2014-05-19 MED ORDER — PANTOPRAZOLE SODIUM 40 MG IV SOLR
INTRAVENOUS | Status: AC
  Administered 2014-05-19: 80 mg via INTRAVENOUS
  Filled 2014-05-19: qty 160

## 2014-05-19 MED ORDER — PANTOPRAZOLE SODIUM 40 MG IV SOLR
8.0000 mg/h | INTRAVENOUS | Status: DC
  Administered 2014-05-20 (×2): 8 mg/h via INTRAVENOUS
  Filled 2014-05-19 (×5): qty 80

## 2014-05-19 NOTE — ED Notes (Signed)
Protonix IV continuous drip restarted in right hand alongside NS slow infusion for pt comfort.

## 2014-05-19 NOTE — ED Notes (Signed)
Sent from PMD. Weakness since Thursday. Limited oral intake. ?GI bleed with black stools and black emesis

## 2014-05-19 NOTE — Patient Instructions (Signed)
Please proceed directly to the ED.

## 2014-05-19 NOTE — ED Notes (Signed)
Report called to [facility]: room 1233, Arbie [place], [place]

## 2014-05-19 NOTE — ED Notes (Signed)
Protonix continuous infusion of 8mg /hour switched from right AC IV line to Left hand IV line at 1445.  At 1500 patient c/o itching to left hand iv site.  Infusion stopped at this time, line flushed, pending possible new IV site infusion.

## 2014-05-19 NOTE — Progress Notes (Signed)
Subjective:    [name], female    DOB: 04/12/1955, 59 y.o.   MRN: [id number]  HPI  Christy Abbott is a 59 yr old female who presents today with chief complaint of N/V since Thursday of this week.  She denies abdominal pain. Reports black stool and black emesis no "coffee grounds."  She denies associated chest pain. She does report sob. She is tolerating liquids. No appetite for solids. Denies NSAID use.  Denies sick contacts, denies recent travel.   Review of Systems See HPI.   Past Medical History  Diagnosis Date  . Hypertension   . Diabetes mellitus without complication   . Chest pain   . Atrial flutter     s/p CTI by Dr Lovena Le 02/2013    History   Social History  . Marital Status: Married    Spouse Name: N/A  . Number of Children: N/A  . Years of Education: N/A   Occupational History  . Not on file.   Social History Main Topics  . Smoking status: Former Research scientist (life sciences)  . Smokeless tobacco: Not on file  . Alcohol Use: No  . Drug Use: No  . Sexual Activity: Not on file   Other Topics Concern  . 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      Past Surgical History  Procedure Laterality Date  . Ablation  03-04-2013    CTI by Dr Lovena Le for atrial flutter  . Tonsillectomy  1972 ?  Marland Kitchen Cholecystectomy  1982  . Atrial flutter ablation N/A 03/04/2013    Procedure: ATRIAL FLUTTER ABLATION;  Surgeon: Evans Lance, MD;  Location: [facility] CATH LAB;  Service: Cardiovascular;  Laterality: N/A;    Family History  Problem Relation Age of Onset  . Atrial fibrillation Mother   . Arrhythmia Mother   . Diabetes Mother   . Hodgkin's lymphoma Father     Allergies  Allergen Reactions  . Jardiance [Empagliflozin]     Causes heart palpitations    Current Outpatient Prescriptions on File Prior to Visit  Medication Sig Dispense Refill  . amLODipine (NORVASC)  10 MG tablet Take 0.5 tablets (5 mg total) by mouth daily. Or as directed 30 tablet 6  . aspirin 81 MG tablet Take 81 mg by mouth daily.    Marland Kitchen Bioflavonoid Products (ESTER C PO) Take 1 tablet by mouth daily.    . Calcium Carbonate-Vitamin D (CALTRATE 600+D PO) Take 1 tablet by mouth 2 (two) times daily.    . carvedilol (COREG) 12.5 MG tablet Take 1 tablet (12.5 mg total) by mouth 2 (two) times daily. 180 tablet 3  . cephALEXin (KEFLEX) 250 MG capsule Take 250 mg by mouth 4 (four) times daily.    . flecainide (TAMBOCOR) 50 MG tablet Take 1 tablet (50 mg total) by mouth 2 (two) times daily. 180 tablet 3  . glimepiride (AMARYL) 4 MG tablet TAKE ONE TABLET BY MOUTH ONCE DAILY BEFORE BREAKFAST 30 tablet 3  . lisinopril-hydrochlorothiazide (PRINZIDE,ZESTORETIC) 20-25 MG per tablet TAKE ONE TABLET BY MOUTH ONCE DAILY 30 tablet 1  . metFORMIN (GLUCOPHAGE) 500 MG tablet Take 2 tablets by mouth twice a day. 120 tablet 3  . Omega-3 Fatty Acids (FISH OIL) 1200 MG CAPS Take 1,200 mg by mouth daily.     . potassium chloride SA (K-DUR,KLOR-CON) 20 MEQ tablet Take  1 tablet (20 mEq total) by mouth daily. 30 tablet 3   No current facility-administered medications on file prior to visit.    BP 97/58 mmHg  Pulse 131  Temp(Src) 97.5 F (36.4 C) (Oral)  Resp 24  Ht 5' 4.5" (1.638 m)  Wt 138 lb 6.4 oz (62.778 kg)  BMI 23.40 kg/m2  SpO2 98%       Objective:   Physical Exam  Constitutional: She is oriented to person, place, and time. She appears well-developed and well-nourished.  Sick appearing white female  HENT:  Head: Normocephalic and atraumatic.  Cardiovascular: Regular rhythm and normal heart sounds.  Tachycardia present.   No murmur heard. Pulmonary/Chest: Breath sounds normal. Tachypnea noted. No respiratory distress. She has no wheezes.  Abdominal: Soft. Bowel sounds are normal. She exhibits no distension and no mass. There is no tenderness. There is no rebound and no guarding.  Neurological:  She is alert and oriented to person, place, and time.  Skin:  Mildly diaphoretic  Psychiatric: She has a normal mood and affect. Her behavior is normal. Judgment and thought content normal.          Assessment & Plan:  Report given to charge nurse Marla in med center ED.

## 2014-05-19 NOTE — ED Notes (Addendum)
Protonix 80mg  infusion 175ml bolus completed at 1425.  Protonix 80mg  continuous infusion at 8mg /hour infusion started at 1425.

## 2014-05-19 NOTE — ED Provider Notes (Signed)
CSN: [id number]     Arrival date & time 05/19/14  1151 History   First MD Initiated Contact with Patient 05/19/14 1254     Chief Complaint  Patient presents with  . Weakness     (Consider location/radiation/quality/duration/timing/severity/associated sxs/prior Treatment) HPI   Christy Abbott is a 59 y.o. female with past medical history significant for non-insulin-dependent diabetes, hypertension, atrial flutter (s/p ablation; not anticoagulated, she takes a low-dose daily aspirin daily) sent from primary care for evaluation of tachycardia and hypotension. Patient states that she had a dry cough onset 1 week ago associated with extreme fatigue, DOE.  The cough has resolved and she has had melanotic stool for small volume bowel movements in the last 24 hours she's also had what she describes as dark vomitus (not coffee ground) 2 times in the last 24 hours. Take it at the onset of thickness proximally one week ago she had a few episodes of nausea vomiting and diarrhea but it was normally colored, this resolved after the first day.  Patient denies chest pain, syncope, shortness of breath at rest, abdominal pain, heavy alcohol use, liver issues, abdominal pain, heavy NSAID use, history of GI bleed, history of diverticulosis, increasing peripheral edema, calf pain, leg swelling. His never had a colonoscopy. On review of systems she notes a bilateral lower extremity achiness.  Past Medical History  Diagnosis Date  . Hypertension   . Diabetes mellitus without complication   . Chest pain   . Atrial flutter     s/p CTI by Dr Lovena Le 02/2013   Past Surgical History  Procedure Laterality Date  . Ablation  03-04-2013    CTI by Dr Lovena Le for atrial flutter  . Tonsillectomy  1972 ?  Marland Kitchen Cholecystectomy  1982  . Atrial flutter ablation N/A 03/04/2013    Procedure: ATRIAL FLUTTER ABLATION;  Surgeon: Evans Lance, MD;  Location: [facility] CATH LAB;  Service: Cardiovascular;  Laterality: N/A;   Family History   Problem Relation Age of Onset  . Atrial fibrillation Mother   . Arrhythmia Mother   . Diabetes Mother   . Hodgkin's lymphoma Father    History  Substance Use Topics  . Smoking status: Former Research scientist (life sciences)  . Smokeless tobacco: Not on file  . Alcohol Use: No   OB History    No data available     Review of Systems  10 systems reviewed and found to be negative, except as noted in the HPI.   Allergies  Jardiance  Home Medications   Prior to Admission medications   Medication Sig Start Date End Date Taking? Authorizing Provider  amLODipine (NORVASC) 10 MG tablet Take 0.5 tablets (5 mg total) by mouth daily. Or as directed 12/03/13   Jettie Booze, MD  aspirin 81 MG tablet Take 81 mg by mouth daily.    Historical Provider, MD  Bioflavonoid Products (ESTER C PO) Take 1 tablet by mouth daily.    Historical Provider, MD  Calcium Carbonate-Vitamin D (CALTRATE 600+D PO) Take 1 tablet by mouth 2 (two) times daily.    Historical Provider, MD  carvedilol (COREG) 12.5 MG tablet Take 1 tablet (12.5 mg total) by mouth 2 (two) times daily. 04/15/13   Evans Lance, MD  cephALEXin (KEFLEX) 250 MG capsule Take 250 mg by mouth 4 (four) times daily.    Historical Provider, MD  flecainide (TAMBOCOR) 50 MG tablet Take 1 tablet (50 mg total) by mouth 2 (two) times daily. 04/15/13   Evans Lance,  MD  glimepiride (AMARYL) 4 MG tablet TAKE ONE TABLET BY MOUTH ONCE DAILY BEFORE BREAKFAST 01/30/14   [name], NP  lisinopril-hydrochlorothiazide (PRINZIDE,ZESTORETIC) 20-25 MG per tablet TAKE ONE TABLET BY MOUTH ONCE DAILY 04/10/14   [name], NP  metFORMIN (GLUCOPHAGE) 500 MG tablet Take 2 tablets by mouth twice a day. 01/30/14   [name], NP  Omega-3 Fatty Acids (FISH OIL) 1200 MG CAPS Take 1,200 mg by mouth daily.     Historical Provider, MD  potassium chloride SA (K-DUR,KLOR-CON) 20 MEQ tablet Take 1 tablet (20 mEq total) by mouth daily. 02/13/14   [name], NP   BP  129/78 mmHg  Pulse 103  Temp(Src) 97.7 F (36.5 C) (Oral)  Resp 19  Ht 5\' 6"  (1.676 m)  Wt 138 lb (62.596 kg)  BMI 22.28 kg/m2  SpO2 100% Physical Exam  Constitutional: She is oriented to person, place, and time. She appears well-developed and well-nourished. No distress.  HENT:  Head: Normocephalic and atraumatic.  Dry mucous membranes.  Eyes: Conjunctivae and EOM are normal. Pupils are equal, round, and reactive to light.  Neck: Normal range of motion.  Cardiovascular: Regular rhythm and intact distal pulses.   Tachycardic in the 110s.  Pulmonary/Chest: Effort normal and breath sounds normal. No stridor. No respiratory distress. She has no wheezes. She has no rales. She exhibits no tenderness.  Abdominal: Soft. Bowel sounds are normal. She exhibits no distension and no mass. There is no tenderness. There is no rebound and no guarding.  Genitourinary:  Rectal exam is chaperoned by nurse: No rashes or lesions, good rectal tone, positive melanotic stool.  Musculoskeletal: Normal range of motion. She exhibits tenderness. She exhibits no edema.  No calf asymmetry, superficial collaterals, palpable cords, edema, Homans sign negative bilaterally.   Pt is diffusely tender to palpation along the anterior shins of both legs.  Neurological: She is alert and oriented to person, place, and time.  Psychiatric: She has a normal mood and affect.  Nursing note and vitals reviewed.   ED Course  Procedures (including critical care time) Labs Review Labs Reviewed  CBC WITH DIFFERENTIAL/PLATELET - Abnormal; Notable for the following:    RBC 5.18 (*)    Hemoglobin 15.8 (*)    Platelets 133 (*)    Monocytes Relative 2 (*)    All other components within normal limits  COMPREHENSIVE METABOLIC PANEL - Abnormal; Notable for the following:    Sodium 129 (*)    Potassium 2.9 (*)    Glucose, Bld 338 (*)    BUN 68 (*)    GFR calc non Af Amer 77 (*)    GFR calc Af Amer 90 (*)    All other  components within normal limits  OCCULT BLOOD X 1 CARD TO LAB, STOOL - Abnormal; Notable for the following:    Fecal Occult Bld POSITIVE (*)    All other components within normal limits  I-STAT CG4 LACTIC ACID, ED - Abnormal; Notable for the following:    Lactic Acid, Venous 2.60 (*)    All other components within normal limits  PROTIME-INR  APTT  TROPONIN I  LACTIC ACID, PLASMA    Imaging Review Dg Chest Portable 1 View  05/19/2014   CLINICAL DATA:  59 year old female with weakness nausea and vomiting. Initial encounter.  EXAM: PORTABLE CHEST - 1 VIEW  COMPARISON:  12/21/2012 and earlier.  FINDINGS: Portable AP upright view at 1321 hrs. Normal lung volumes. Normal cardiac size and mediastinal contours. No pneumothorax  or pneumoperitoneum. Allowing for portable technique, the lungs are clear. Visualized tracheal air column is within normal limits.  IMPRESSION: Negative, no acute cardiopulmonary abnormality.   Electronically Signed   By: Genevie Ann M.D.   On: 05/19/2014 13:43     EKG Interpretation None      MDM   Final diagnoses:  Upper GI bleed    Filed Vitals:   05/19/14 1155 05/19/14 1406  BP: 101/76 129/78  Pulse: 118 103  Temp: 97.7 F (36.5 C)   TempSrc: Oral   Resp: 22 19  Height: 5\' 6"  (1.676 m)   Weight: 138 lb (62.596 kg)   SpO2: 94% 100%    Medications  pantoprazole (PROTONIX) 80 mg in sodium chloride 0.9 % 250 mL (0.32 mg/mL) infusion (not administered)  pantoprazole (PROTONIX) 80 mg in sodium chloride 0.9 % 100 mL IVPB (not administered)  potassium chloride 10 mEq in 100 mL IVPB (10 mEq Intravenous New Bag/Given 05/19/14 1459)  sodium chloride 0.9 % bolus 1,000 mL (0 mLs Intravenous Stopped 05/19/14 1408)  pantoprazole (PROTONIX) 40 MG injection (80 mg Intravenous (Continuous Infusion) Given 05/19/14 1404)    Christy Abbott is a pleasant 59 y.o. female presenting with melanotic stool, dark emesis, tachycardic and hypotensive with systolic in the 696V. No history  of prior GI bleed, patient will be given fluid bolus, Protonix bolus and drip started.   H&H is 15/44, no indication for transfusion at this time. Lactic acid is slightly elevated 2.6. Sodium and potassium are mildly decreased. BUN is elevated at 68 consistent with upper GI bleed.  Gastroenterology consult from Dr. Amedeo Plenty appreciated: Agrees with care plan for transfer to [facility]. [facility] hospital[facility] Dr. Coralyn Pear accepts admission to a step down bed.   Monico Blitz, PA-C 05/19/14 1517  Wandra Arthurs, MD 05/19/14 [phone]

## 2014-05-19 NOTE — Assessment & Plan Note (Signed)
+   dark stool, hematemesis, clinically dehydrated. EKG notes more pronounced ST depression compared to previous V4-V6.  Needs evaluation for anemia and rule MI.  Advised that she proceed to ED- will be transported via wheelchair.

## 2014-05-20 ENCOUNTER — Encounter (HOSPITAL_COMMUNITY): Payer: Self-pay

## 2014-05-20 ENCOUNTER — Encounter (HOSPITAL_COMMUNITY)
Admission: EM | Disposition: A | Discharge status: Home / Self Care | Payer: Self-pay | Source: Home / Self Care | Attending: Internal Medicine

## 2014-05-20 DIAGNOSIS — E876 Hypokalemia: Secondary | ICD-10-CM

## 2014-05-20 DIAGNOSIS — I10 Essential (primary) hypertension: Secondary | ICD-10-CM

## 2014-05-20 DIAGNOSIS — I4892 Unspecified atrial flutter: Secondary | ICD-10-CM

## 2014-05-20 DIAGNOSIS — E1165 Type 2 diabetes mellitus with hyperglycemia: Secondary | ICD-10-CM

## 2014-05-20 DIAGNOSIS — K922 Gastrointestinal hemorrhage, unspecified: Secondary | ICD-10-CM

## 2014-05-20 HISTORY — PX: ESOPHAGOGASTRODUODENOSCOPY: SHX5428

## 2014-05-20 LAB — BASIC METABOLIC PANEL
ANION GAP: 6 (ref 5–15)
Anion gap: 3 — ABNORMAL LOW (ref 5–15)
BUN: 36 mg/dL — ABNORMAL HIGH (ref 6–23)
BUN: 30 mg/dL — ABNORMAL HIGH (ref 6–23)
CO2: 31 mmol/L (ref 19–32)
CO2: 28 mmol/L (ref 19–32)
Calcium: 7.6 mg/dL — ABNORMAL LOW (ref 8.4–10.5)
Calcium: 7.4 mg/dL — ABNORMAL LOW (ref 8.4–10.5)
Chloride: 106 mmol/L (ref 96–112)
Chloride: 100 mmol/L (ref 96–112)
Creatinine, Ser: 0.51 mg/dL (ref 0.50–1.10)
Creatinine, Ser: 0.48 mg/dL — ABNORMAL LOW (ref 0.50–1.10)
GFR calc Af Amer: >90 mL/min (ref >90)
GFR calc Af Amer: >90 mL/min (ref >90)
GFR calc non Af Amer: >90 mL/min (ref >90)
GFR calc non Af Amer: >90 mL/min (ref >90)
GLUCOSE: 145 mg/dL — AB (ref 70–99)
Glucose, Bld: 130 mg/dL — ABNORMAL HIGH (ref 70–99)
POTASSIUM: 3 mmol/L — AB (ref 3.5–5.1)
Potassium: 3.1 mmol/L — ABNORMAL LOW (ref 3.5–5.1)
Sodium: 140 mmol/L (ref 135–145)
Sodium: 134 mmol/L — ABNORMAL LOW (ref 135–145)

## 2014-05-20 LAB — TYPE AND SCREEN
ABO/RH(D): A POS
Antibody Screen: NEGATIVE

## 2014-05-20 LAB — CBC
HEMATOCRIT: 27.6 % — AB (ref 36.0–46.0)
HEMOGLOBIN: 9.7 g/dL — AB (ref 12.0–15.0)
MCH: 30.5 pg (ref 26.0–34.0)
MCHC: 35.1 g/dL (ref 30.0–36.0)
MCV: 86.8 fL (ref 78.0–100.0)
Platelets: 83 10*3/uL — ABNORMAL LOW (ref 150–400)
RBC: 3.18 MIL/uL — AB (ref 3.87–5.11)
RDW: 12.7 % (ref 11.5–15.5)
WBC: 7.2 10*3/uL (ref 4.0–10.5)

## 2014-05-20 LAB — GLUCOSE, CAPILLARY
GLUCOSE-CAPILLARY: 133 mg/dL — AB (ref 70–99)
Glucose-Capillary: 114 mg/dL — ABNORMAL HIGH (ref 70–99)
Glucose-Capillary: 127 mg/dL — ABNORMAL HIGH (ref 70–99)
Glucose-Capillary: 145 mg/dL — ABNORMAL HIGH (ref 70–99)
Glucose-Capillary: 154 mg/dL — ABNORMAL HIGH (ref 70–99)
Glucose-Capillary: 82 mg/dL (ref 70–99)

## 2014-05-20 LAB — HEMOGLOBIN AND HEMATOCRIT, BLOOD
HCT: 31 % — ABNORMAL LOW (ref 36.0–46.0)
HCT: 27.6 % — ABNORMAL LOW (ref 36.0–46.0)
HEMATOCRIT: 25.5 % — AB (ref 36.0–46.0)
HEMOGLOBIN: 9.7 g/dL — AB (ref 12.0–15.0)
Hemoglobin: 11.1 g/dL — ABNORMAL LOW (ref 12.0–15.0)
Hemoglobin: 9 g/dL — ABNORMAL LOW (ref 12.0–15.0)

## 2014-05-20 LAB — MAGNESIUM: MAGNESIUM: 1.3 mg/dL — AB (ref 1.5–2.5)

## 2014-05-20 LAB — ABO/RH: ABO/RH(D): A POS

## 2014-05-20 LAB — MRSA PCR SCREENING: MRSA by PCR: NEGATIVE

## 2014-05-20 SURGERY — EGD (ESOPHAGOGASTRODUODENOSCOPY)
Anesthesia: Moderate Sedation

## 2014-05-20 MED ORDER — POTASSIUM CHLORIDE 20 MEQ/15ML (10%) PO SOLN
40.0000 meq | Freq: Once | ORAL | Status: AC
  Administered 2014-05-20: 40 meq via ORAL
  Filled 2014-05-20: qty 30

## 2014-05-20 MED ORDER — ACETAMINOPHEN 650 MG RE SUPP: 650.0000 mg | Freq: Four times a day (QID) | RECTAL | Status: DC | PRN

## 2014-05-20 MED ORDER — CARVEDILOL 12.5 MG PO TABS
12.5000 mg | ORAL_TABLET | Freq: Two times a day (BID) | ORAL | Status: DC
  Administered 2014-05-20 – 2014-05-22 (×6): 12.5 mg via ORAL
  Filled 2014-05-20 (×6): qty 1

## 2014-05-20 MED ORDER — SODIUM CHLORIDE 0.9 % IV SOLN: 8.0000 mg/h | INTRAVENOUS | Status: DC

## 2014-05-20 MED ORDER — SODIUM CHLORIDE 0.9 % IV SOLN: INTRAVENOUS | Status: DC

## 2014-05-20 MED ORDER — ONDANSETRON HCL 4 MG/2ML IJ SOLN: 4.0000 mg | Freq: Four times a day (QID) | INTRAMUSCULAR | Status: DC | PRN

## 2014-05-20 MED ORDER — PANTOPRAZOLE SODIUM 40 MG IV SOLR: 40.0000 mg | Freq: Two times a day (BID) | INTRAVENOUS | Status: DC

## 2014-05-20 MED ORDER — MIDAZOLAM HCL 10 MG/2ML IJ SOLN
INTRAMUSCULAR | Status: DC | PRN
  Administered 2014-05-20 (×2): 2 mg via INTRAVENOUS

## 2014-05-20 MED ORDER — MIDAZOLAM HCL 10 MG/2ML IJ SOLN
INTRAMUSCULAR | Status: AC
  Filled 2014-05-20: qty 2

## 2014-05-20 MED ORDER — ACETAMINOPHEN 325 MG PO TABS: 650.0000 mg | ORAL_TABLET | Freq: Four times a day (QID) | ORAL | Status: DC | PRN

## 2014-05-20 MED ORDER — INSULIN ASPART 100 UNIT/ML ~~LOC~~ SOLN
0.0000 [IU] | SUBCUTANEOUS | Status: DC
  Administered 2014-05-20: 2 [IU] via SUBCUTANEOUS
  Administered 2014-05-20 (×2): 1 [IU] via SUBCUTANEOUS
  Administered 2014-05-21: 3 [IU] via SUBCUTANEOUS
  Administered 2014-05-21: 2 [IU] via SUBCUTANEOUS
  Administered 2014-05-21 (×2): 1 [IU] via SUBCUTANEOUS
  Administered 2014-05-22: 2 [IU] via SUBCUTANEOUS
  Administered 2014-05-22: 1 [IU] via SUBCUTANEOUS

## 2014-05-20 MED ORDER — SODIUM CHLORIDE 0.9 % IV BOLUS (SEPSIS)
500.0000 mL | Freq: Once | INTRAVENOUS | Status: AC
  Administered 2014-05-20: 500 mL via INTRAVENOUS

## 2014-05-20 MED ORDER — SODIUM CHLORIDE 0.9 % IV SOLN
INTRAVENOUS | Status: DC
  Administered 2014-05-20 – 2014-05-21 (×2): via INTRAVENOUS

## 2014-05-20 MED ORDER — ONDANSETRON HCL 4 MG PO TABS: 4.0000 mg | ORAL_TABLET | Freq: Four times a day (QID) | ORAL | Status: DC | PRN

## 2014-05-20 MED ORDER — FENTANYL CITRATE 0.05 MG/ML IJ SOLN
INTRAMUSCULAR | Status: AC
  Filled 2014-05-20: qty 2

## 2014-05-20 MED ORDER — POTASSIUM CHLORIDE CRYS ER 20 MEQ PO TBCR
40.0000 meq | EXTENDED_RELEASE_TABLET | Freq: Once | ORAL | Status: AC
  Administered 2014-05-20: 40 meq via ORAL
  Filled 2014-05-20: qty 2

## 2014-05-20 MED ORDER — FLECAINIDE ACETATE 50 MG PO TABS
50.0000 mg | ORAL_TABLET | Freq: Two times a day (BID) | ORAL | Status: DC
  Administered 2014-05-20 – 2014-05-22 (×5): 50 mg via ORAL
  Filled 2014-05-20 (×8): qty 1

## 2014-05-20 MED ORDER — HYDRALAZINE HCL 20 MG/ML IJ SOLN: 5.0000 mg | Freq: Four times a day (QID) | INTRAMUSCULAR | Status: DC | PRN

## 2014-05-20 MED ORDER — POTASSIUM CHLORIDE 10 MEQ/100ML IV SOLN: 10.0000 meq | INTRAVENOUS | Status: DC

## 2014-05-20 MED ORDER — FENTANYL CITRATE 0.05 MG/ML IJ SOLN
INTRAMUSCULAR | Status: DC | PRN
  Administered 2014-05-20 (×2): 25 ug via INTRAVENOUS

## 2014-05-20 MED ORDER — HYDROMORPHONE HCL 1 MG/ML IJ SOLN: 0.5000 mg | INTRAMUSCULAR | Status: DC | PRN

## 2014-05-20 MED ORDER — BUTAMBEN-TETRACAINE-BENZOCAINE 2-2-14 % EX AERO
INHALATION_SPRAY | CUTANEOUS | Status: DC | PRN
  Administered 2014-05-20: 2 via TOPICAL

## 2014-05-20 MED ORDER — DIPHENHYDRAMINE HCL 50 MG/ML IJ SOLN
INTRAMUSCULAR | Status: AC
  Filled 2014-05-20: qty 1

## 2014-05-20 MED ORDER — MAGNESIUM SULFATE 2 GM/50ML IV SOLN
2.0000 g | Freq: Once | INTRAVENOUS | Status: AC
  Administered 2014-05-20: 2 g via INTRAVENOUS
  Filled 2014-05-20: qty 50

## 2014-05-20 NOTE — Progress Notes (Signed)
Patient has had 4 medium size stools which are dark and tarry

## 2014-05-20 NOTE — Progress Notes (Signed)
[name]  female  GQQ:[id number]    DOB: [birth year]    DOA: 05/19/2014  PCP: Nance Pear., NP   Brief HPI:  Christy Abbott is a 59 y.o. female with a history of A.Flutter, HTN, DM2 who presented to the [facility] ED with complaints of an episode of vomitng coffee ground emesis followed by several episodes of Melena since last night. She denies any ABD pain. She was evaluated in the ED and her initial Hb was found to be 15, however she was tachycardic and had hypotension. She was administered IVFs and placed on a Protonix infusion and GI Dr Amedeo Plenty was consulted. She was transferred to [facility].    Assessment/Plan: Principal Problem:   Upper gastrointestinal bleed - Hemoglobin currently stable, continue IV Protonix drip, GI consulted, patient underwent  EGD which showed a small crease proximal gastric ulcer with hiatal hernia, distal antral gastritis, duodenitis with possible ulcer or erosion at the junction of the first and second portion  - await Hpylori testing, monitor stool and hemoglobin for further bleeding   Active Problems:   Atrial flutter - Heart rate controlled, continue to hold aspirin due to GI bleeding    Essential hypertension, benign - BP much more stable today    Diabetes type 2, uncontrolled -Continue sliding scale insulin     Hypokalemia - Replaced    hypomagnesemia  - Patient was given IV replacement, recheck BMET in a.m., magnesium level as well   DVT Prophylaxis:SCDs   Code Status: full code   Family Communication: discussed in detail with the patient   Disposition:  Consultants: Gastroenterology   procedures  EGD   Antibiotics:  None    Subjective: Patient seen and examined earlier in the morning, had no active nausea or vomiting however he did have a bowel movement early this morning which was black per patient. She denied any nausea, vomiting and fevers or chills or abdominal pain.    Objective   Weight change:   Intake/Output Summary (Last 24 hours) at 05/20/14 1119 Last data filed at 05/20/14 0700  Gross per 24 hour  Intake 1697.5 ml  Output    700 ml  Net  997.5 ml   Blood pressure 116/66, pulse 96, temperature 98.6 F (37 C), temperature source Oral, resp. rate 28, height 5\' 6"  (1.676 m), weight 65.1 kg (143 lb 8.3 oz), SpO2 98 %.  Physical Exam: General: Alert and awake, oriented x3, not in any acute distress. HEENT: anicteric sclera, PERLA, EOMI CVS: S1-S2 clear, no murmur rubs or gallops Chest: clear to auscultation bilaterally, no wheezing, rales or rhonchi Abdomen: soft nontender, nondistended, normal bowel sounds  Extremities: no cyanosis, clubbing or edema noted bilaterally Neuro: Cranial nerves II-XII intact, no focal neurological deficits  Lab Results: Basic Metabolic Panel:  Recent Labs Lab 05/20/14 0052 05/20/14 0351  NA 140 134*  K 3.1* 3.0*  CL 106 100  CO2 31 28  GLUCOSE 130* 145*  BUN 36* 30*  CREATININE 0.51 0.48*  CALCIUM 7.6* 7.4*  MG 1.3*  --    Liver Function Tests:  Recent Labs Lab 05/19/14 1330  AST 30  ALT 21  ALKPHOS 95  BILITOT 0.8  PROT 6.9  ALBUMIN 3.6   No results for input(s): LIPASE, AMYLASE in the last 168 hours. No results for input(s): AMMONIA in the last 168 hours. CBC:  Recent Labs Lab 05/19/14 1330  05/20/14 0351 05/20/14 0900  WBC 6.2  --  7.2  --  NEUTROABS 3.6  --   --   --   HGB 15.8*  < > 9.7* 9.7*  HCT 44.3  < > 27.6* 27.6*  MCV 85.5  --  86.8  --   PLT 133*  --  83*  --   < > = values in this interval not displayed. Cardiac Enzymes:  Recent Labs Lab 05/19/14 1330  TROPONINI <0.03   BNP: Invalid input(s): POCBNP CBG:  Recent Labs Lab 05/20/14 0044 05/20/14 0327 05/20/14 0736  GLUCAP 114* 145* 133*     Micro Results: Recent Results (from the past 240 hour(s))  MRSA PCR Screening     Status: None   Collection Time: 05/19/14  9:40 PM  Result Value Ref Range Status   MRSA by  PCR NEGATIVE NEGATIVE Final    Comment:        The GeneXpert MRSA Assay (FDA approved for NASAL specimens only), is one component of a comprehensive MRSA colonization surveillance program. It is not intended to diagnose MRSA infection nor to guide or monitor treatment for MRSA infections. Performed at [facility]     Studies/Results: Dg Chest Portable 1 View  05/19/2014   CLINICAL DATA:  59 year old female with weakness nausea and vomiting. Initial encounter.  EXAM: PORTABLE CHEST - 1 VIEW  COMPARISON:  12/21/2012 and earlier.  FINDINGS: Portable AP upright view at 1321 hrs. Normal lung volumes. Normal cardiac size and mediastinal contours. No pneumothorax or pneumoperitoneum. Allowing for portable technique, the lungs are clear. Visualized tracheal air column is within normal limits.  IMPRESSION: Negative, no acute cardiopulmonary abnormality.   Electronically Signed   By: Genevie Ann M.D.   On: 05/19/2014 13:43    Medications: Scheduled Meds: . carvedilol  12.5 mg Oral BID  . flecainide  50 mg Oral BID  . insulin aspart  0-9 Units Subcutaneous 6 times per day  . pantoprazole (PROTONIX) IV  80 mg Intravenous Once  . [START ON 05/23/2014] pantoprazole (PROTONIX) IV  40 mg Intravenous Q12H    time spent 25 minutes    LOS: 1 day   RAI,RIPUDEEP M.D. [facility] 05/20/2014, 11:19 AM Pager: [phone]  If 7PM-7AM, please contact night-coverage [email or web address] Password [email or web address]

## 2014-05-20 NOTE — H&P (Signed)
[facility] Admission History and Physical       [name] DOB: 17-Oct-1955 DOA: 05/19/2014  Referring physician: EDP PCP: Christy Abbott., NP  Specialists:   Chief Complaint: Black Stools  HPI: Christy Abbott is a 59 y.o. female with a history of A.Flutter, HTN, DM2 who presented to the [facility] ED with complaints of an episode of vomitng coffee ground emesis followed by several episodes of  Melena since last night. She denies any ABD pain.   She was evaluated in the ED and her initial Hb was found to be 15, however she was tachycardic and had hypotension .  She was administered IVFs and placed on a  Protonix infusion and GI Dr Amedeo Plenty was consulted.  She was transferred to [facility].     Review of Systems:  Constitutional: No Weight Loss, No Weight Gain, Night Sweats, Fevers, Chills, Dizziness, Light Headedness, Fatigue, or Generalized Weakness HEENT: No Headaches, Difficulty Swallowing,Tooth/Dental Problems,Sore Throat,  No Sneezing, Rhinitis, Ear Ache, Nasal Congestion, or Post Nasal Drip,  Cardio-vascular:  No Chest pain, Orthopnea, PND, Edema in Lower Extremities, Anasarca, Dizziness, Palpitations  Resp: No Dyspnea, No DOE, No Productive Cough, No Non-Productive Cough, No Hemoptysis, No Wheezing.    GI: No Heartburn, Indigestion, Abdominal Pain, Nausea, Vomiting, Diarrhea, Constipation, +Hematemesis, Hematochezia, +Melena, Change in Bowel Habits,  Loss of Appetite  GU: No Dysuria, No Change in Color of Urine, No Urgency or Urinary Frequency, No Flank pain.  Musculoskeletal: No Joint Pain or Swelling, No Decreased Range of Motion, No Back Pain.  Neurologic: No Syncope, No Seizures, Muscle Weakness, Paresthesia, Vision Disturbance or Loss, No Diplopia, No Vertigo, No Difficulty Walking,  Skin: No Rash or Lesions. Psych: No Change in Mood or Affect, No Depression or Anxiety, No Memory loss, No Confusion, or Hallucinations   Past Medical History   Diagnosis Date  . Hypertension   . Diabetes mellitus without complication   . Chest pain   . Atrial flutter     s/p CTI by Dr Lovena Le 02/2013     Past Surgical History  Procedure Laterality Date  . Ablation  03-04-2013    CTI by Dr Lovena Le for atrial flutter  . Tonsillectomy  1972 ?  Marland Kitchen Cholecystectomy  1982  . Atrial flutter ablation N/A 03/04/2013    Procedure: ATRIAL FLUTTER ABLATION;  Surgeon: Evans Lance, MD;  Location: [facility] CATH LAB;  Service: Cardiovascular;  Laterality: N/A;      Prior to Admission medications   Medication Sig Start Date End Date Taking? Authorizing Provider  amLODipine (NORVASC) 10 MG tablet Take 0.5 tablets (5 mg total) by mouth daily. Or as directed 12/03/13  Yes Christy Booze, MD  carvedilol (COREG) 12.5 MG tablet Take 1 tablet (12.5 mg total) by mouth 2 (two) times daily. 04/15/13  Yes Evans Lance, MD  potassium chloride SA (K-DUR,KLOR-CON) 20 MEQ tablet Take 1 tablet (20 mEq total) by mouth daily. 02/13/14  Yes [name], NP  aspirin 81 MG tablet Take 81 mg by mouth daily.    Historical Provider, MD  Bioflavonoid Products (ESTER C PO) Take 1 tablet by mouth daily.    Historical Provider, MD  Calcium Carbonate-Vitamin D (CALTRATE 600+D PO) Take 1 tablet by mouth 2 (two) times daily.    Historical Provider, MD  flecainide (TAMBOCOR) 50 MG tablet Take 1 tablet (50 mg total) by mouth 2 (two) times daily. 04/15/13   Evans Lance, MD  glimepiride (AMARYL) 4 MG tablet  TAKE ONE TABLET BY MOUTH ONCE DAILY BEFORE BREAKFAST 01/30/14   [name], NP  lisinopril-hydrochlorothiazide (PRINZIDE,ZESTORETIC) 20-25 MG per tablet TAKE ONE TABLET BY MOUTH ONCE DAILY 04/10/14   [name], NP  metFORMIN (GLUCOPHAGE) 500 MG tablet Take 2 tablets by mouth twice a day. 01/30/14   [name], NP  Omega-3 Fatty Acids (FISH OIL) 1200 MG CAPS Take 1,200 mg by mouth daily.     Historical Provider, MD     Allergies  Allergen Reactions  .  Jardiance [Empagliflozin]     Causes heart palpitations    Social History:  reports that she has quit smoking. She does not have any smokeless tobacco history on file. She reports that she does not drink alcohol or use illicit drugs.    Family History  Problem Relation Age of Onset  . Atrial fibrillation Mother   . Arrhythmia Mother   . Diabetes Mother   . Hodgkin's lymphoma Father        Physical Exam:  GEN:  Pleasant Well Nourished and well Developed 59 y.o. Caucasian female examined and in no acute distress; cooperative with exam Filed Vitals:   05/19/14 2100 05/19/14 2125 05/19/14 2200 05/19/14 2300  BP: 132/73  120/73 119/73  Pulse: 104 127 110 95  Temp:      TempSrc:      Resp: 26 24 17 22   Height:      Weight:      SpO2: 98% 96% 95% 96%   Blood pressure 119/73, pulse 95, temperature 98.5 F (36.9 C), temperature source Oral, resp. rate 22, height 5\' 6"  (1.676 m), weight 65.1 kg (143 lb 8.3 oz), SpO2 96 %. PSYCH: She is alert and oriented x4; does not appear anxious does not appear depressed; affect is normal HEENT: Normocephalic and Atraumatic, Mucous membranes pink; PERRLA; EOM intact; Fundi:  Benign;  No scleral icterus, Nares: Patent, Oropharynx: Clear, Upper Denture Present, and Lower Partial present,    Neck:  FROM, No Cervical Lymphadenopathy nor Thyromegaly or Carotid Bruit; No JVD; Breasts:: Not examined CHEST WALL: No tenderness CHEST: Normal respiration, clear to auscultation bilaterally HEART: Tachycardic Regular Rhythm; no murmurs rubs or gallops BACK: No kyphosis or scoliosis; No CVA tenderness ABDOMEN: Positive Bowel Sounds, Soft Non-Tender, No Rebound or Guarding; No Masses,No Organomegaly Rectal Exam: Not done EXTREMITIES: No Cyanosis, Clubbing, or Edema; No Ulcerations. Genitalia: not examined PULSES: 2+ and symmetric SKIN: Normal hydration no rash or ulceration CNS:  Alert and Oriented x 4, No Focal Deficits Vascular: pulses palpable throughout      Labs on Admission:  Basic Metabolic Panel:  Recent Labs Lab 05/19/14 1330  NA 129*  K 2.9*  CL 96  CO2 26  GLUCOSE 338*  BUN 68*  CREATININE 0.82  CALCIUM 8.5   Liver Function Tests:  Recent Labs Lab 05/19/14 1330  AST 30  ALT 21  ALKPHOS 95  BILITOT 0.8  PROT 6.9  ALBUMIN 3.6   No results for input(s): LIPASE, AMYLASE in the last 168 hours. No results for input(s): AMMONIA in the last 168 hours. CBC:  Recent Labs Lab 05/19/14 1330  WBC 6.2  NEUTROABS 3.6  HGB 15.8*  HCT 44.3  MCV 85.5  PLT 133*   Cardiac Enzymes:  Recent Labs Lab 05/19/14 1330  TROPONINI <0.03    BNP (last 3 results) No results for input(s): BNP in the last 8760 hours.  ProBNP (last 3 results) No results for input(s): PROBNP in the last 8760 hours.  CBG: No  results for input(s): GLUCAP in the last 168 hours.  Radiological Exams on Admission: Dg Chest Portable 1 View  05/19/2014   CLINICAL DATA:  59 year old female with weakness nausea and vomiting. Initial encounter.  EXAM: PORTABLE CHEST - 1 VIEW  COMPARISON:  12/21/2012 and earlier.  FINDINGS: Portable AP upright view at 1321 hrs. Normal lung volumes. Normal cardiac size and mediastinal contours. No pneumothorax or pneumoperitoneum. Allowing for portable technique, the lungs are clear. Visualized tracheal air column is within normal limits.  IMPRESSION: Negative, no acute cardiopulmonary abnormality.   Electronically Signed   By: Genevie Ann M.D.   On: 05/19/2014 13:43     EKG: Independently reviewed. Sinus Tachycardia rate 105   Assessment/Plan:   59 y.o. female with  Principal Problem:   1.    Upper gastrointestinal bleed   Stepdown Unit Monitoring   GI consulted Dr. Amedeo Plenty to see   IV Protonix Drip   Discontinue ASA Rx for now   Monitor H/Hs   Transfuse if Needed   Active Problems:   2.    Hypokalemia   IV KCl replacement   Check magnesium Level     3.    Atrial flutter   Continue Carvedilol, and Flecainide  Rx   Continue Cardiac Monitoring     4.    Essential hypertension, benign   Continue Carvedilol Rx as BP tolerates   PRN IV Hydralazine for SBP > 170     5.    Diabetes type 2, uncontrolled   Hold Metformin RX   SSI coverage PRN   Check HbA1C      6.   DVT Prophylaxis    SCDs          Code Status:     FULL CODE        Family Communication:    No Family Present    Disposition Plan:    Inpatient Status        Time spent:  [facility] Pager [phone]   If [place] Please Contact the Day Rounding Team MD for [facility]  If 7PM-7AM, Please Contact Night-Floor Coverage  [email or web address] Password [email or web address] 05/20/2014, 12:01 AM     ADDENDUM:   Patient was seen and examined on 05/20/2014

## 2014-05-20 NOTE — Op Note (Signed)
[facility] [place], [address]   ENDOSCOPY PROCEDURE REPORT  PATIENT: Christy, Abbott  MR#: [id number] BIRTHDATE: 1955-12-24 , 58  yrs. old GENDER: female ENDOSCOPIST: Teena Irani, MD REFERRED BY: PROCEDURE DATE:  June 05, 2014 PROCEDURE: ASA CLASS: INDICATIONS:  melena and reported coffee-ground emesis MEDICATIONS: 50 g fentanyl, 4 mg Versed TOPICAL ANESTHETIC:  DESCRIPTION OF PROCEDURE: After the risks benefits and alternatives of the procedure were thoroughly explained, informed consent was obtained.  The Pentax Gastroscope [id number] endoscope was introduced through the mouth and advanced to the second portion of the duodenum , Without limitations.  The instrument was slowly withdrawn as the mucosa was fully examined.    esophagus: Small sliding hiatal hernia no visible esophagitis or stigmata of hemorrhage Stomach: Small raised area with some central ulceration in the proximal body, no stigmata of hemorrhage, biopsied. Mild to moderate antral gastritis with no focal erosions or ulcers. CLOtest was obtained. Duodenum: Mild inflammation of the bulb with erythema and granularity. Increased edema at the junction of the bulb and second portion which was hard to view straight on but with surrounding erythema could've harbored a small ulcer. No bleeding noted. The distal duodenum also showed erythema and granularityon the folds              The scope was then withdrawn from the patient and the procedure completed.  COMPLICATIONS: There were no immediate complications.  ENDOSCOPIC IMPRESSION: small raised proximal gastric ulcer Hiatal hernia Distal antral gastritis Duodenitis, possible ulcer or erosion at the junction of first and second portion  RECOMMENDATIONS: continue PPI therapy. Await H. pylori testing and biopsies from proximal gastric ulcer. Monitor stools and hemoglobin for further bleeding.  REPEAT EXAM:  eSigned:  Teena Irani, MD 06/05/14 11:16 AM    CC:  CPT CODES: ICD CODES:  The ICD and CPT codes recommended by this software are interpretations from the data that the clinical staff has captured with the software.  The verification of the translation of this report to the ICD and CPT codes and modifiers is the sole responsibility of the health care institution and practicing physician where this report was generated.  [employer] will not be held responsible for the validity of the ICD and CPT codes included on this report.  AMA assumes no liability for data contained or not contained herein. CPT is a Designer, television/film set of the [employer].

## 2014-05-20 NOTE — Consult Note (Signed)
[facility] Consult Note  Referring Provider: No ref. provider found Primary Care Physician:  Nance Pear., NP Primary Gastroenterologist:  Dr.  Laurel Dimmer Complaint: Black emesis and stools HPI: Christy Abbott is an 59 y.o. white female  who presents with a 2 day history of one episode of black emesis followed by continued black stools. She was found to be haven't initial hemoglobin of 15 which has fallen to 9.7 with heme positive with a soft blood pressure and elevated BUN. She denies alcohol use or nonsteroidal anti-inflammatory drugs. She has no prior history of GI bleeding. She's never had an upper or lower endoscopy. He has a history of atrial flutter but is not on any blood thinners.  Past Medical History  Diagnosis Date  . Hypertension   . Diabetes mellitus without complication   . Chest pain   . Atrial flutter     s/p CTI by Dr Lovena Le 02/2013    Past Surgical History  Procedure Laterality Date  . Ablation  03-04-2013    CTI by Dr Lovena Le for atrial flutter  . Tonsillectomy  1972 ?  Marland Kitchen Cholecystectomy  1982  . Atrial flutter ablation N/A 03/04/2013    Procedure: ATRIAL FLUTTER ABLATION;  Surgeon: Evans Lance, MD;  Location: [facility] CATH LAB;  Service: Cardiovascular;  Laterality: N/A;    Medications Prior to Admission  Medication Sig Dispense Refill  . amLODipine (NORVASC) 10 MG tablet Take 0.5 tablets (5 mg total) by mouth daily. Or as directed 30 tablet 6  . carvedilol (COREG) 12.5 MG tablet Take 1 tablet (12.5 mg total) by mouth 2 (two) times daily. 180 tablet 3  . potassium chloride SA (K-DUR,KLOR-CON) 20 MEQ tablet Take 1 tablet (20 mEq total) by mouth daily. 30 tablet 3  . aspirin 81 MG tablet Take 81 mg by mouth daily.    Marland Kitchen Bioflavonoid Products (ESTER C PO) Take 1 tablet by mouth daily.    . Calcium Carbonate-Vitamin D (CALTRATE 600+D PO) Take 1 tablet by mouth 2 (two) times daily.    . flecainide (TAMBOCOR) 50 MG tablet Take 1 tablet (50 mg total)  by mouth 2 (two) times daily. 180 tablet 3  . glimepiride (AMARYL) 4 MG tablet TAKE ONE TABLET BY MOUTH ONCE DAILY BEFORE BREAKFAST 30 tablet 3  . lisinopril-hydrochlorothiazide (PRINZIDE,ZESTORETIC) 20-25 MG per tablet TAKE ONE TABLET BY MOUTH ONCE DAILY 30 tablet 1  . metFORMIN (GLUCOPHAGE) 500 MG tablet Take 2 tablets by mouth twice a day. 120 tablet 3  . Omega-3 Fatty Acids (FISH OIL) 1200 MG CAPS Take 1,200 mg by mouth daily.       Allergies:  Allergies  Allergen Reactions  . Jardiance [Empagliflozin]     Causes heart palpitations    Family History  Problem Relation Age of Onset  . Atrial fibrillation Mother   . Arrhythmia Mother   . Diabetes Mother   . Hodgkin's lymphoma Father     Social History:  reports that she has quit smoking. She does not have any smokeless tobacco history on file. She reports that she does not drink alcohol or use illicit drugs.  Review of Systems: negative except as above   Blood pressure 120/65, pulse 94, temperature 98.5 F (36.9 C), temperature source Oral, resp. rate 19, height $RemoveBe'5\' 6"'WLRIfXldc$  (1.676 m), weight 65.1 kg (143 lb 8.3 oz), SpO2 93 %. Head: Normocephalic, without obvious abnormality, atraumatic Neck: no adenopathy, no carotid bruit, no JVD, supple, symmetrical, trachea midline and thyroid not enlarged, symmetric, no  tenderness/mass/nodules Resp: clear to auscultation bilaterally Cardio: regular rate and rhythm, S1, S2 normal, no murmur, click, rub or gallop GI: Abdomen soft nondistended with normoactive bowel sounds. No hepatosplenomegaly mass or guarding Extremities: extremities normal, atraumatic, no cyanosis or edema  Results for orders placed or performed during the hospital encounter of 05/19/14 (from the past 48 hour(s))  CBC with Differential     Status: Abnormal   Collection Time: 05/19/14  1:30 PM  Result Value Ref Range   WBC 6.2 4.0 - 10.5 K/uL   RBC 5.18 (H) 3.87 - 5.11 MIL/uL   Hemoglobin 15.8 (H) 12.0 - 15.0 g/dL   HCT  44.3 36.0 - 46.0 %   MCV 85.5 78.0 - 100.0 fL   MCH 30.5 26.0 - 34.0 pg   MCHC 35.7 30.0 - 36.0 g/dL   RDW 12.9 11.5 - 15.5 %   Platelets 133 (L) 150 - 400 K/uL   Neutrophils Relative % 56 43 - 77 %   Lymphocytes Relative 40 12 - 46 %   Monocytes Relative 2 (L) 3 - 12 %   Eosinophils Relative 0 0 - 5 %   Basophils Relative 0 0 - 1 %   Band Neutrophils 2 0 - 10 %   Metamyelocytes Relative 0 %   Myelocytes 0 %   Promyelocytes Absolute 0 %   Blasts 0 %   nRBC 0 0 /100 WBC   Neutro Abs 3.6 1.7 - 7.7 K/uL   Lymphs Abs 2.5 0.7 - 4.0 K/uL   Monocytes Absolute 0.1 0.1 - 1.0 K/uL   Eosinophils Absolute 0.0 0.0 - 0.7 K/uL   Basophils Absolute 0.0 0.0 - 0.1 K/uL   WBC Morphology ATYPICAL LYMPHOCYTES   Comprehensive metabolic panel     Status: Abnormal   Collection Time: 05/19/14  1:30 PM  Result Value Ref Range   Sodium 129 (L) 135 - 145 mmol/L   Potassium 2.9 (L) 3.5 - 5.1 mmol/L   Chloride 96 96 - 112 mmol/L   CO2 26 19 - 32 mmol/L   Glucose, Bld 338 (H) 70 - 99 mg/dL   BUN 68 (H) 6 - 23 mg/dL   Creatinine, Ser 0.82 0.50 - 1.10 mg/dL   Calcium 8.5 8.4 - 10.5 mg/dL   Total Protein 6.9 6.0 - 8.3 g/dL   Albumin 3.6 3.5 - 5.2 g/dL   AST 30 0 - 37 U/L   ALT 21 0 - 35 U/L   Alkaline Phosphatase 95 39 - 117 U/L   Total Bilirubin 0.8 0.3 - 1.2 mg/dL   GFR calc non Af Amer 77 (L) >90 mL/min   GFR calc Af Amer 90 (L) >90 mL/min    Comment: (NOTE) The eGFR has been calculated using the CKD EPI equation. This calculation has not been validated in all clinical situations. eGFR's persistently <90 mL/min signify possible Chronic Kidney Disease.    Anion gap 7 5 - 15  Protime-INR     Status: None   Collection Time: 05/19/14  1:30 PM  Result Value Ref Range   Prothrombin Time 12.7 11.6 - 15.2 seconds   INR 0.95 0.00 - 1.49  APTT     Status: None   Collection Time: 05/19/14  1:30 PM  Result Value Ref Range   aPTT 27 24 - 37 seconds  Occult blood card to lab, stool Provider will collect      Status: Abnormal   Collection Time: 05/19/14  1:30 PM  Result Value Ref Range  Fecal Occult Bld POSITIVE (A) NEGATIVE  Troponin I     Status: None   Collection Time: 05/19/14  1:30 PM  Result Value Ref Range   Troponin I <0.03 <0.031 ng/mL    Comment:        NO INDICATION OF MYOCARDIAL INJURY.   I-Stat CG4 Lactic Acid, ED     Status: Abnormal   Collection Time: 05/19/14  1:49 PM  Result Value Ref Range   Lactic Acid, Venous 2.60 (HH) 0.5 - 2.0 mmol/L   Comment NOTIFIED PHYSICIAN   MRSA PCR Screening     Status: None   Collection Time: 05/19/14  9:40 PM  Result Value Ref Range   MRSA by PCR NEGATIVE NEGATIVE    Comment:        The GeneXpert MRSA Assay (FDA approved for NASAL specimens only), is one component of a comprehensive MRSA colonization surveillance program. It is not intended to diagnose MRSA infection nor to guide or monitor treatment for MRSA infections. Performed at [facility]   Glucose, capillary     Status: Abnormal   Collection Time: 05/20/14 12:44 AM  Result Value Ref Range   Glucose-Capillary 114 (H) 70 - 99 mg/dL   Comment 1 Notify RN    Comment 2 Document in Chart   Hemoglobin and hematocrit, blood     Status: Abnormal   Collection Time: 05/20/14 12:52 AM  Result Value Ref Range   Hemoglobin 11.1 (L) 12.0 - 15.0 g/dL   HCT 31.0 (L) 36.0 - 46.0 %  Type and screen     Status: None   Collection Time: 05/20/14 12:52 AM  Result Value Ref Range   ABO/RH(D) A POS    Antibody Screen NEG    Sample Expiration 05/23/2014   Magnesium     Status: Abnormal   Collection Time: 05/20/14 12:52 AM  Result Value Ref Range   Magnesium 1.3 (L) 1.5 - 2.5 mg/dL  Basic metabolic panel     Status: Abnormal   Collection Time: 05/20/14 12:52 AM  Result Value Ref Range   Sodium 140 135 - 145 mmol/L   Potassium 3.1 (L) 3.5 - 5.1 mmol/L   Chloride 106 96 - 112 mmol/L   CO2 31 19 - 32 mmol/L   Glucose, Bld 130 (H) 70 - 99 mg/dL   BUN 36 (H) 6 - 23 mg/dL    Creatinine, Ser 0.51 0.50 - 1.10 mg/dL   Calcium 7.6 (L) 8.4 - 10.5 mg/dL   GFR calc non Af Amer >90 >90 mL/min   GFR calc Af Amer >90 >90 mL/min    Comment: (NOTE) The eGFR has been calculated using the CKD EPI equation. This calculation has not been validated in all clinical situations. eGFR's persistently <90 mL/min signify possible Chronic Kidney Disease.    Anion gap 3 (L) 5 - 15  ABO/Rh     Status: None   Collection Time: 05/20/14 12:52 AM  Result Value Ref Range   ABO/RH(D) A POS   Glucose, capillary     Status: Abnormal   Collection Time: 05/20/14  3:27 AM  Result Value Ref Range   Glucose-Capillary 145 (H) 70 - 99 mg/dL   Comment 1 Notify RN    Comment 2 Document in Chart   Basic metabolic panel     Status: Abnormal   Collection Time: 05/20/14  3:51 AM  Result Value Ref Range   Sodium 134 (L) 135 - 145 mmol/L   Potassium 3.0 (L) 3.5 -  5.1 mmol/L   Chloride 100 96 - 112 mmol/L   CO2 28 19 - 32 mmol/L   Glucose, Bld 145 (H) 70 - 99 mg/dL   BUN 30 (H) 6 - 23 mg/dL   Creatinine, Ser 0.48 (L) 0.50 - 1.10 mg/dL   Calcium 7.4 (L) 8.4 - 10.5 mg/dL   GFR calc non Af Amer >90 >90 mL/min   GFR calc Af Amer >90 >90 mL/min    Comment: (NOTE) The eGFR has been calculated using the CKD EPI equation. This calculation has not been validated in all clinical situations. eGFR's persistently <90 mL/min signify possible Chronic Kidney Disease.    Anion gap 6 5 - 15  CBC     Status: Abnormal   Collection Time: 05/20/14  3:51 AM  Result Value Ref Range   WBC 7.2 4.0 - 10.5 K/uL   RBC 3.18 (L) 3.87 - 5.11 MIL/uL   Hemoglobin 9.7 (L) 12.0 - 15.0 g/dL   HCT 27.6 (L) 36.0 - 46.0 %   MCV 86.8 78.0 - 100.0 fL   MCH 30.5 26.0 - 34.0 pg   MCHC 35.1 30.0 - 36.0 g/dL   RDW 12.7 11.5 - 15.5 %   Platelets 83 (L) 150 - 400 K/uL    Comment: SPECIMEN CHECKED FOR CLOTS PLATELET COUNT CONFIRMED BY SMEAR   Glucose, capillary     Status: Abnormal   Collection Time: 05/20/14  7:36 AM   Result Value Ref Range   Glucose-Capillary 133 (H) 70 - 99 mg/dL   Comment 1 Notify RN    Comment 2 Document in Chart    Dg Chest Portable 1 View  05/19/2014   CLINICAL DATA:  59 year old female with weakness nausea and vomiting. Initial encounter.  EXAM: PORTABLE CHEST - 1 VIEW  COMPARISON:  12/21/2012 and earlier.  FINDINGS: Portable AP upright view at 1321 hrs. Normal lung volumes. Normal cardiac size and mediastinal contours. No pneumothorax or pneumoperitoneum. Allowing for portable technique, the lungs are clear. Visualized tracheal air column is within normal limits.  IMPRESSION: Negative, no acute cardiopulmonary abnormality.   Electronically Signed   By: Genevie Ann M.D.   On: 05/19/2014 13:43    Assessment: Upper GI bleed manifested as coffee-ground emesis and dark stools with fall in hemoglobin since presentation. Plan:  Empiric PPI treatment, we'll proceed with upper endoscopy later this morning. AJLUN,GBMB C 05/20/2014, 8:54 AM

## 2014-05-20 NOTE — Progress Notes (Signed)
CARE MANAGEMENT NOTE 05/20/2014  Patient:  Christy Abbott, Christy Abbott   Account Number:  [id number]  Date Initiated:  05/20/2014  Documentation initiated by:  Anneka Studer  Subjective/Objective Assessment:   gi bld with hypotension     Action/Plan:   home when stable   Anticipated DC Date:  05/23/2014   Anticipated DC Plan:  HOME/SELF CARE  In-house referral  NA      DC Planning Services  NA      [facility] Choice  NA   Choice offered to / List presented to:  NA      DME agency  NA        [place] agency  NA   Status of service:  In process, will continue to follow Medicare Important Message given?   (If response is "NO", the following Medicare IM given date fields will be blank) Date Medicare IM given:   Medicare IM given by:   Date Additional Medicare IM given:   Additional Medicare IM given by:    Discharge Disposition:    Per UR Regulation:  Reviewed for med. necessity/level of care/duration of stay  If discussed at [place] of Stay Meetings, dates discussed:    Comments:  May 20, 2014/Zaylynn Rickett L. Rosana Hoes, RN, BSN, CCM. Case Management [facility] [phone] No discharge needs present of time of review.

## 2014-05-21 ENCOUNTER — Encounter (HOSPITAL_COMMUNITY): Payer: Self-pay | Admitting: Gastroenterology

## 2014-05-21 LAB — MAGNESIUM: MAGNESIUM: 1.3 mg/dL — AB (ref 1.5–2.5)

## 2014-05-21 LAB — BASIC METABOLIC PANEL
Anion gap: 4 — ABNORMAL LOW (ref 5–15)
BUN: 13 mg/dL (ref 6–23)
CO2: 28 mmol/L (ref 19–32)
Calcium: 7.5 mg/dL — ABNORMAL LOW (ref 8.4–10.5)
Chloride: 108 mmol/L (ref 96–112)
Creatinine, Ser: 0.4 mg/dL — ABNORMAL LOW (ref 0.50–1.10)
GFR calc non Af Amer: >90 mL/min (ref >90)
GLUCOSE: 104 mg/dL — AB (ref 70–99)
Potassium: 2.9 mmol/L — ABNORMAL LOW (ref 3.5–5.1)
SODIUM: 140 mmol/L (ref 135–145)

## 2014-05-21 LAB — GLUCOSE, CAPILLARY
GLUCOSE-CAPILLARY: 193 mg/dL — AB (ref 70–99)
GLUCOSE-CAPILLARY: 238 mg/dL — AB (ref 70–99)
Glucose-Capillary: 104 mg/dL — ABNORMAL HIGH (ref 70–99)
Glucose-Capillary: 116 mg/dL — ABNORMAL HIGH (ref 70–99)
Glucose-Capillary: 129 mg/dL — ABNORMAL HIGH (ref 70–99)
Glucose-Capillary: 148 mg/dL — ABNORMAL HIGH (ref 70–99)

## 2014-05-21 LAB — HEMOGLOBIN AND HEMATOCRIT, BLOOD
HCT: 24.2 % — ABNORMAL LOW (ref 36.0–46.0)
HEMATOCRIT: 27 % — AB (ref 36.0–46.0)
HEMOGLOBIN: 9.3 g/dL — AB (ref 12.0–15.0)
Hemoglobin: 8.5 g/dL — ABNORMAL LOW (ref 12.0–15.0)

## 2014-05-21 LAB — HEMOGLOBIN A1C
HEMOGLOBIN A1C: 6.4 % — AB (ref 4.8–5.6)
Mean Plasma Glucose: 137 mg/dL

## 2014-05-21 LAB — POTASSIUM: Potassium: 3.3 mmol/L — ABNORMAL LOW (ref 3.5–5.1)

## 2014-05-21 LAB — CLOTEST (H. PYLORI), BIOPSY: Helicobacter screen: NEGATIVE

## 2014-05-21 MED ORDER — POTASSIUM CHLORIDE 10 MEQ/100ML IV SOLN
10.0000 meq | INTRAVENOUS | Status: AC
  Administered 2014-05-21: 10 meq via INTRAVENOUS

## 2014-05-21 MED ORDER — SUCRALFATE 1 GM/10ML PO SUSP: 1.0000 g | Freq: Three times a day (TID) | ORAL | Status: DC

## 2014-05-21 MED ORDER — SUCRALFATE 1 GM/10ML PO SUSP
1.0000 g | Freq: Three times a day (TID) | ORAL | Status: DC
  Administered 2014-05-21 – 2014-05-22 (×5): 1 g via ORAL
  Filled 2014-05-21 (×7): qty 10

## 2014-05-21 MED ORDER — POTASSIUM CHLORIDE CRYS ER 20 MEQ PO TBCR: 20.0000 meq | EXTENDED_RELEASE_TABLET | Freq: Every day | ORAL | Status: DC

## 2014-05-21 MED ORDER — POTASSIUM CHLORIDE CRYS ER 20 MEQ PO TBCR: 40.0000 meq | EXTENDED_RELEASE_TABLET | ORAL | Status: DC

## 2014-05-21 MED ORDER — PANTOPRAZOLE SODIUM 40 MG PO TBEC: 40.0000 mg | DELAYED_RELEASE_TABLET | Freq: Two times a day (BID) | ORAL | Status: DC

## 2014-05-21 MED ORDER — MAGNESIUM SULFATE 50 % IJ SOLN
3.0000 g | Freq: Once | INTRAMUSCULAR | Status: AC
  Administered 2014-05-21: 3 g via INTRAVENOUS
  Filled 2014-05-21: qty 6

## 2014-05-21 MED ORDER — POTASSIUM CHLORIDE 10 MEQ/100ML IV SOLN
10.0000 meq | INTRAVENOUS | Status: AC
  Administered 2014-05-21 (×3): 10 meq via INTRAVENOUS
  Filled 2014-05-21 (×4): qty 100

## 2014-05-21 MED ORDER — PANTOPRAZOLE SODIUM 40 MG PO TBEC
40.0000 mg | DELAYED_RELEASE_TABLET | Freq: Two times a day (BID) | ORAL | Status: DC
  Administered 2014-05-21 – 2014-05-22 (×3): 40 mg via ORAL
  Filled 2014-05-21 (×5): qty 1

## 2014-05-21 MED ORDER — POTASSIUM CHLORIDE CRYS ER 20 MEQ PO TBCR
40.0000 meq | EXTENDED_RELEASE_TABLET | Freq: Once | ORAL | Status: AC
  Administered 2014-05-21: 40 meq via ORAL
  Filled 2014-05-21: qty 2

## 2014-05-21 NOTE — Progress Notes (Signed)
[facility] Progress Note  Subjective: Patient feels okay, diet advanced one stool today still described as dark  Objective: Vital signs in last 24 hours: Temp:  [98.2 F (36.8 C)-99.8 F (37.7 C)] 99.5 F (37.5 C) (03/10 0800) Pulse Rate:  [91-103] 101 (03/10 0900) Resp:  [14-34] 24 (03/10 0900) BP: (83-135)/(33-90) 126/66 mmHg (03/10 0900) SpO2:  [87 %-98 %] 96 % (03/10 0900) Weight change:    PE: Abdomen soft nondistended  Lab Results: Results for orders placed or performed during the hospital encounter of 05/19/14 (from the past 24 hour(s))  Glucose, capillary     Status: Abnormal   Collection Time: 05/20/14 12:31 PM  Result Value Ref Range   Glucose-Capillary 127 (H) 70 - 99 mg/dL   Comment 1 Notify RN    Comment 2 Document in Chart   Glucose, capillary     Status: Abnormal   Collection Time: 05/20/14  3:52 PM  Result Value Ref Range   Glucose-Capillary 154 (H) 70 - 99 mg/dL   Comment 1 Notify RN    Comment 2 Document in Chart   Hemoglobin and hematocrit, blood     Status: Abnormal   Collection Time: 05/20/14  4:15 PM  Result Value Ref Range   Hemoglobin 9.0 (L) 12.0 - 15.0 g/dL   HCT 25.5 (L) 36.0 - 46.0 %  Glucose, capillary     Status: None   Collection Time: 05/20/14  7:59 PM  Result Value Ref Range   Glucose-Capillary 82 70 - 99 mg/dL  Glucose, capillary     Status: Abnormal   Collection Time: 05/21/14 12:06 AM  Result Value Ref Range   Glucose-Capillary 104 (H) 70 - 99 mg/dL   Comment 1 Notify RN    Comment 2 Document in Chart   Hemoglobin and hematocrit, blood     Status: Abnormal   Collection Time: 05/21/14 12:16 AM  Result Value Ref Range   Hemoglobin 8.5 (L) 12.0 - 15.0 g/dL   HCT 24.2 (L) 36.0 - 56.3 %  Basic metabolic panel     Status: Abnormal   Collection Time: 05/21/14 12:50 AM  Result Value Ref Range   Sodium 140 135 - 145 mmol/L   Potassium 2.9 (L) 3.5 - 5.1 mmol/L   Chloride 108 96 - 112 mmol/L   CO2 28 19 - 32 mmol/L   Glucose, Bld 104 (H) 70 - 99 mg/dL   BUN 13 6 - 23 mg/dL   Creatinine, Ser 0.40 (L) 0.50 - 1.10 mg/dL   Calcium 7.5 (L) 8.4 - 10.5 mg/dL   GFR calc non Af Amer >90 >90 mL/min   GFR calc Af Amer >90 >90 mL/min   Anion gap 4 (L) 5 - 15  Magnesium     Status: Abnormal   Collection Time: 05/21/14 12:50 AM  Result Value Ref Range   Magnesium 1.3 (L) 1.5 - 2.5 mg/dL  Glucose, capillary     Status: Abnormal   Collection Time: 05/21/14  3:54 AM  Result Value Ref Range   Glucose-Capillary 129 (H) 70 - 99 mg/dL   Comment 1 Notify RN    Comment 2 Document in Chart   Hemoglobin and hematocrit, blood     Status: Abnormal   Collection Time: 05/21/14  9:05 AM  Result Value Ref Range   Hemoglobin 9.3 (L) 12.0 - 15.0 g/dL   HCT 27.0 (L) 36.0 - 46.0 %    Studies/Results: Dg Chest Portable 1 View  05/19/2014   CLINICAL DATA:  59 year old female with weakness nausea and vomiting. Initial encounter.  EXAM: PORTABLE CHEST - 1 VIEW  COMPARISON:  12/21/2012 and earlier.  FINDINGS: Portable AP upright view at 1321 hrs. Normal lung volumes. Normal cardiac size and mediastinal contours. No pneumothorax or pneumoperitoneum. Allowing for portable technique, the lungs are clear. Visualized tracheal air column is within normal limits.  IMPRESSION: Negative, no acute cardiopulmonary abnormality.   Electronically Signed   By: Genevie Ann M.D.   On: 05/19/2014 13:43      Assessment: GI bleed, upper tract source likely, small gastric ulcer possible duodenal ulcer or erosion and gastritis seen on EGD.  Plan: Await biopsies and CLOtest. Otherwise continue double dose PPI. Discharge when hemoglobin stable and appearance of bowel movement began to improve  Christy Abbott C 05/21/2014, 10:57 AM

## 2014-05-21 NOTE — Progress Notes (Signed)
[name]  female  WCH:[id number]    DOB: 1955/10/05    DOA: 05/19/2014  PCP: Nance Pear., NP   Brief HPI:  Christy Abbott is a 59 y.o. female with a history of A.Flutter, HTN, DM2 who presented to [facility] ED with complaints of an episode of vomitng coffee ground emesis followed by several episodes of Melena since last night. She denies any ABD pain. She was evaluated in the ED and her initial Hb was found to be 15, however she was tachycardic and had hypotension. She was administered IVFs and placed on a Protonix infusion and GI Dr Amedeo Plenty was consulted. She was transferred to [facility].    Assessment/Plan: Principal Problem:   Upper gastrointestinal bleed - Hemoglobin currently stable, GI consulted, patient underwent  EGD which showed a small crease proximal gastric ulcer with hiatal hernia, distal antral gastritis, duodenitis with possible ulcer or erosion at the junction of the first and second portion  - await Hpylori testing, monitor stool and hemoglobin for further bleeding  - Diet advanced to solid, placed on oral PPI, Carafate  Active Problems:   Atrial flutter - Heart rate controlled, continue to hold aspirin due to GI bleeding    Essential hypertension, benign - BP much more stable today    Diabetes type 2, uncontrolled -Continue sliding scale insulin     Hypokalemia - Replaced IV and oral, recheck later today    hypomagnesemia  - Placed on IV replacement 3g x 1  DVT Prophylaxis:SCDs   Code Status: full code   Family Communication: discussed in detail with the patient. All imaging results, EGD results, lab results explained to the patient in detail.   Disposition: Transfer to telemetry floor  Consultants: Gastroenterology   procedures  EGD   Antibiotics:  None    Subjective:  Patient seen and examined, no fevers or chills, no abdominal pain. Overall clinically improving. H&H stable one bowel movement  this morning was dark    Objective  Weight change:   Intake/Output Summary (Last 24 hours) at 05/21/14 1238 Last data filed at 05/21/14 0900  Gross per 24 hour  Intake   3590 ml  Output   1926 ml  Net   1664 ml   Blood pressure 126/66, pulse 101, temperature 99.5 F (37.5 C), temperature source Oral, resp. rate 24, height 5\' 6"  (1.676 m), weight 65.1 kg (143 lb 8.3 oz), SpO2 96 %.  Physical Exam: General: Alert and awake, oriented x3, not in any acute distress. CVS: S1-S2 clear, no murmur rubs or gallops Chest: clear to auscultation bilaterally, no wheezing, rales or rhonchi Abdomen: soft nontender, nondistended, normal bowel sounds  Extremities: no cyanosis, clubbing or edema noted bilaterally Neuro: Cranial nerves II-XII intact, no focal neurological deficits  Lab Results: Basic Metabolic Panel:  Recent Labs Lab 05/20/14 0351 05/21/14 0050  NA 134* 140  K 3.0* 2.9*  CL 100 108  CO2 28 28  GLUCOSE 145* 104*  BUN 30* 13  CREATININE 0.48* 0.40*  CALCIUM 7.4* 7.5*  MG  --  1.3*   Liver Function Tests:  Recent Labs Lab 05/19/14 1330  AST 30  ALT 21  ALKPHOS 95  BILITOT 0.8  PROT 6.9  ALBUMIN 3.6   No results for input(s): LIPASE, AMYLASE in the last 168 hours. No results for input(s): AMMONIA in the last 168 hours. CBC:  Recent Labs Lab 05/19/14 1330  05/20/14 0351  05/21/14 0016 05/21/14 0905  WBC 6.2  --  7.2  --   --   --   NEUTROABS 3.6  --   --   --   --   --   HGB 15.8*  < > 9.7*  < > 8.5* 9.3*  HCT 44.3  < > 27.6*  < > 24.2* 27.0*  MCV 85.5  --  86.8  --   --   --   PLT 133*  --  83*  --   --   --   < > = values in this interval not displayed. Cardiac Enzymes:  Recent Labs Lab 05/19/14 1330  TROPONINI <0.03   BNP: Invalid input(s): POCBNP CBG:  Recent Labs Lab 05/20/14 1959 05/21/14 0006 05/21/14 0354 05/21/14 0830 05/21/14 1140  GLUCAP 82 104* 129* 116* 148*     Micro Results: Recent Results (from the past 240 hour(s))   MRSA PCR Screening     Status: None   Collection Time: 05/19/14  9:40 PM  Result Value Ref Range Status   MRSA by PCR NEGATIVE NEGATIVE Final    Comment:        The GeneXpert MRSA Assay (FDA approved for NASAL specimens only), is one component of a comprehensive MRSA colonization surveillance program. It is not intended to diagnose MRSA infection nor to guide or monitor treatment for MRSA infections. Performed at [facility]     Studies/Results: Dg Chest Portable 1 View  05/19/2014   CLINICAL DATA:  59 year old female with weakness nausea and vomiting. Initial encounter.  EXAM: PORTABLE CHEST - 1 VIEW  COMPARISON:  12/21/2012 and earlier.  FINDINGS: Portable AP upright view at 1321 hrs. Normal lung volumes. Normal cardiac size and mediastinal contours. No pneumothorax or pneumoperitoneum. Allowing for portable technique, the lungs are clear. Visualized tracheal air column is within normal limits.  IMPRESSION: Negative, no acute cardiopulmonary abnormality.   Electronically Signed   By: Genevie Ann M.D.   On: 05/19/2014 13:43    Medications: Scheduled Meds: . carvedilol  12.5 mg Oral BID  . flecainide  50 mg Oral BID  . insulin aspart  0-9 Units Subcutaneous 6 times per day  . pantoprazole (PROTONIX) IV  80 mg Intravenous Once  . pantoprazole  40 mg Oral BID AC  . sucralfate  1 g Oral TID WC & HS    time spent 25 minutes    LOS: 2 days   Adelia Baptista M.D. [facility] 05/21/2014, 12:38 PM Pager: [phone]  If 7PM-7AM, please contact night-coverage [email or web address] Password [email or web address]

## 2014-05-22 ENCOUNTER — Other Ambulatory Visit: Payer: Self-pay | Admitting: Internal Medicine

## 2014-05-22 ENCOUNTER — Telehealth: Payer: Self-pay | Admitting: Family

## 2014-05-22 LAB — MAGNESIUM: MAGNESIUM: 1.6 mg/dL (ref 1.5–2.5)

## 2014-05-22 LAB — BASIC METABOLIC PANEL
ANION GAP: 7 (ref 5–15)
BUN: 6 mg/dL (ref 6–23)
CO2: 29 mmol/L (ref 19–32)
Calcium: 8.1 mg/dL — ABNORMAL LOW (ref 8.4–10.5)
Chloride: 108 mmol/L (ref 96–112)
Creatinine, Ser: 0.44 mg/dL — ABNORMAL LOW (ref 0.50–1.10)
GFR calc non Af Amer: >90 mL/min (ref >90)
Glucose, Bld: 118 mg/dL — ABNORMAL HIGH (ref 70–99)
POTASSIUM: 3.5 mmol/L (ref 3.5–5.1)
Sodium: 144 mmol/L (ref 135–145)

## 2014-05-22 LAB — CBC
HCT: 24.1 % — ABNORMAL LOW (ref 36.0–46.0)
HEMOGLOBIN: 8.4 g/dL — AB (ref 12.0–15.0)
MCH: 30.5 pg (ref 26.0–34.0)
MCHC: 34.9 g/dL (ref 30.0–36.0)
MCV: 87.6 fL (ref 78.0–100.0)
PLATELETS: 128 10*3/uL — AB (ref 150–400)
RBC: 2.75 MIL/uL — AB (ref 3.87–5.11)
RDW: 13.1 % (ref 11.5–15.5)
WBC: 5.8 10*3/uL (ref 4.0–10.5)

## 2014-05-22 LAB — GLUCOSE, CAPILLARY
GLUCOSE-CAPILLARY: 107 mg/dL — AB (ref 70–99)
Glucose-Capillary: 140 mg/dL — ABNORMAL HIGH (ref 70–99)
Glucose-Capillary: 200 mg/dL — ABNORMAL HIGH (ref 70–99)

## 2014-05-22 MED ORDER — MAGNESIUM OXIDE 400 (241.3 MG) MG PO TABS
400.0000 mg | ORAL_TABLET | Freq: Two times a day (BID) | ORAL | Status: DC
  Administered 2014-05-22: 400 mg via ORAL
  Filled 2014-05-22: qty 1

## 2014-05-22 MED ORDER — POTASSIUM CHLORIDE CRYS ER 20 MEQ PO TBCR: 20.0000 meq | EXTENDED_RELEASE_TABLET | Freq: Every day | ORAL | Status: DC

## 2014-05-22 MED ORDER — FERROUS GLUCONATE 324 (38 FE) MG PO TABS: 324.0000 mg | ORAL_TABLET | Freq: Two times a day (BID) | ORAL | Status: DC

## 2014-05-22 MED ORDER — FERROUS GLUCONATE 324 (38 FE) MG PO TABS
324.0000 mg | ORAL_TABLET | Freq: Two times a day (BID) | ORAL | Status: DC
  Filled 2014-05-22: qty 1

## 2014-05-22 MED ORDER — MAGNESIUM OXIDE 400 (241.3 MG) MG PO TABS: 400.0000 mg | ORAL_TABLET | Freq: Every day | ORAL | Status: DC

## 2014-05-22 NOTE — Telephone Encounter (Signed)
Please arrange hospital follow up.

## 2014-05-22 NOTE — Progress Notes (Signed)
[facility] Progress Note  Subjective: Patient without complaints, tolerating diet, last stool last night a little less dark than previous  Objective: Vital signs in last 24 hours: Temp:  [98.2 F (36.8 C)-98.8 F (37.1 C)] 98.2 F (36.8 C) (03/11 0445) Pulse Rate:  [88-101] 90 (03/11 0445) Resp:  [15-24] 20 (03/11 0445) BP: (119-131)/(61-75) 131/73 mmHg (03/11 0445) SpO2:  [95 %-97 %] 95 % (03/11 0445) Weight change:    PE: Abdomen soft  Lab Results: Results for orders placed or performed during the hospital encounter of 05/19/14 (from the past 24 hour(s))  Glucose, capillary     Status: Abnormal   Collection Time: 05/21/14  8:30 AM  Result Value Ref Range   Glucose-Capillary 116 (H) 70 - 99 mg/dL  Hemoglobin and hematocrit, blood     Status: Abnormal   Collection Time: 05/21/14  9:05 AM  Result Value Ref Range   Hemoglobin 9.3 (L) 12.0 - 15.0 g/dL   HCT 27.0 (L) 36.0 - 46.0 %  Glucose, capillary     Status: Abnormal   Collection Time: 05/21/14 11:40 AM  Result Value Ref Range   Glucose-Capillary 148 (H) 70 - 99 mg/dL  Potassium     Status: Abnormal   Collection Time: 05/21/14 12:58 PM  Result Value Ref Range   Potassium 3.3 (L) 3.5 - 5.1 mmol/L  Glucose, capillary     Status: Abnormal   Collection Time: 05/21/14  4:20 PM  Result Value Ref Range   Glucose-Capillary 193 (H) 70 - 99 mg/dL   Comment 1 Notify RN    Comment 2 Document in Chart   Glucose, capillary     Status: Abnormal   Collection Time: 05/21/14  9:02 PM  Result Value Ref Range   Glucose-Capillary 238 (H) 70 - 99 mg/dL   Comment 1 Notify RN    Comment 2 Document in Chart   Glucose, capillary     Status: Abnormal   Collection Time: 05/22/14 12:05 AM  Result Value Ref Range   Glucose-Capillary 200 (H) 70 - 99 mg/dL   Comment 1 Notify RN    Comment 2 Document in Chart   Glucose, capillary     Status: Abnormal   Collection Time: 05/22/14  4:44 AM  Result Value Ref Range   Glucose-Capillary 107 (H) 70 - 99 mg/dL   Comment 1 Notify RN    Comment 2 Document in Chart   Basic metabolic panel     Status: Abnormal   Collection Time: 05/22/14  5:56 AM  Result Value Ref Range   Sodium 144 135 - 145 mmol/L   Potassium 3.5 3.5 - 5.1 mmol/L   Chloride 108 96 - 112 mmol/L   CO2 29 19 - 32 mmol/L   Glucose, Bld 118 (H) 70 - 99 mg/dL   BUN 6 6 - 23 mg/dL   Creatinine, Ser 0.44 (L) 0.50 - 1.10 mg/dL   Calcium 8.1 (L) 8.4 - 10.5 mg/dL   GFR calc non Af Amer >90 >90 mL/min   GFR calc Af Amer >90 >90 mL/min   Anion gap 7 5 - 15  Magnesium     Status: None   Collection Time: 05/22/14  5:56 AM  Result Value Ref Range   Magnesium 1.6 1.5 - 2.5 mg/dL  CBC     Status: Abnormal   Collection Time: 05/22/14  6:50 AM  Result Value Ref Range   WBC 5.8 4.0 - 10.5 K/uL   RBC 2.75 (L) 3.87 - 5.11  MIL/uL   Hemoglobin 8.4 (L) 12.0 - 15.0 g/dL   HCT 24.1 (L) 36.0 - 46.0 %   MCV 87.6 78.0 - 100.0 fL   MCH 30.5 26.0 - 34.0 pg   MCHC 34.9 30.0 - 36.0 g/dL   RDW 13.1 11.5 - 15.5 %   Platelets 128 (L) 150 - 400 K/uL  Glucose, capillary     Status: Abnormal   Collection Time: 05/22/14  8:01 AM  Result Value Ref Range   Glucose-Capillary 140 (H) 70 - 99 mg/dL    Studies/Results: No results found.    Assessment: GI bleeding with small carcinoid tumor with ulcer in the gastric body with duodenitis and possible small duodenal ulcer, H. pylori testing negative  Plan: Okay for discharge from GI standpoint with follow-up with me regarding her gastric carcinoid and through 4 weeks. Would treat with proton pump inhibitor for at least 2 months.    Lorie Melichar C 05/22/2014, 8:15 AM

## 2014-05-22 NOTE — Care Management Note (Signed)
    Page 1 of 2   05/22/2014     2:58:53 PM CARE MANAGEMENT NOTE 05/22/2014  Patient:  Christy Abbott, Christy Abbott   Account Number:  [id number]  Date Initiated:  05/20/2014  Documentation initiated by:  DAVIS,RHONDA  Subjective/Objective Assessment:   gi bld with hypotension     Action/Plan:   home when stable   Anticipated DC Date:  05/22/2014   Anticipated DC Plan:  HOME/SELF CARE  In-house referral  [place]  CM consult  Medication Assistance      [facility] Choice  NA   Choice offered to / List presented to:  NA      DME agency  NA        [place] agency  NA   Status of service:  Completed, signed off Medicare Important Message given?   (If response is "NO", the following Medicare IM given date fields will be blank) Date Medicare IM given:   Medicare IM given by:   Date Additional Medicare IM given:   Additional Medicare IM given by:    Discharge Disposition:  HOME/SELF CARE  Per UR Regulation:  Reviewed for med. necessity/level of care/duration of stay  If discussed at [place] of Stay Meetings, dates discussed:    Comments:  05/22/14 Dessa Phi RN BSN NCM [phone] Patient works full time, has spouse-disability.Provided her w/health insurance info as resource, need[employer] meds website for meds discounts,coupons.No further d/c needs.  May 20, 2014/Rhonda L. Rosana Hoes, RN, BSN, CCM. Case Management [place] [phone] No discharge needs present of time of review.

## 2014-05-22 NOTE — Discharge Summary (Signed)
Physician Discharge Summary  Patient ID: YANIN MUHLESTEIN MRN: [id number] DOB/AGE: [birth year] 59 y.o.  Admit date: 05/19/2014 Discharge date: 05/22/2014  Primary Care Physician:  Nance Pear., NP  Discharge Diagnoses:    . Upper gastrointestinal bleed . Acute blood loss anemia secondary to upper GI bleed  . Proximal gastric ulcer with hiatal hernia, distal antral gastritis, duodenitis with possible ulcer . Atrial flutter . Diabetes type 2, uncontrolled . Hypokalemia Hypomagnesemia  . Essential hypertension, benign  Consults:  Gastroenterology, Dr. Amedeo Plenty   Recommendations for Outpatient Follow-up:  Continue PPI through 2 months. Patient will follow-up with Dr. Amedeo Plenty, she has small carcinoid tumor with ulcer in the gastric body with duodenitis, follow-up in 4 weeks.  TESTS THAT NEED FOLLOW-UP CBC, BMET   DIET: carb modified diet    Allergies:   Allergies  Allergen Reactions  . Jardiance [Empagliflozin]     Causes heart palpitations     Discharge Medications:   Medication List    STOP taking these medications        aspirin 81 MG tablet     lisinopril-hydrochlorothiazide 20-25 MG per tablet  Commonly known as:  PRINZIDE,ZESTORETIC     potassium chloride SA 20 MEQ tablet  Commonly known as:  K-DUR,KLOR-CON      TAKE these medications        amLODipine 10 MG tablet  Commonly known as:  NORVASC  Take 0.5 tablets (5 mg total) by mouth daily. Or as directed     CALTRATE 600+D PO  Take 1 tablet by mouth 2 (two) times daily.     carvedilol 12.5 MG tablet  Commonly known as:  COREG  Take 1 tablet (12.5 mg total) by mouth 2 (two) times daily.     ESTER C PO  Take 1 tablet by mouth daily.     ferrous gluconate 324 MG tablet  Commonly known as:  FERGON  Take 1 tablet (324 mg total) by mouth 2 (two) times daily with a meal.     Fish Oil 1200 MG Caps  Take 1,200 mg by mouth daily.     flecainide 50 MG tablet  Commonly known as:  TAMBOCOR  Take 1  tablet (50 mg total) by mouth 2 (two) times daily.     glimepiride 4 MG tablet  Commonly known as:  AMARYL  TAKE ONE TABLET BY MOUTH ONCE DAILY BEFORE BREAKFAST     magnesium oxide 400 (241.3 MG) MG tablet  Commonly known as:  MAG-OX  Take 1 tablet (400 mg total) by mouth daily.     metFORMIN 500 MG tablet  Commonly known as:  GLUCOPHAGE  Take 2 tablets by mouth twice a day.     pantoprazole 40 MG tablet  Commonly known as:  PROTONIX  Take 1 tablet (40 mg total) by mouth 2 (two) times daily before a meal.     sucralfate 1 GM/10ML suspension  Commonly known as:  CARAFATE  Take 10 mLs (1 g total) by mouth 4 (four) times daily -  with meals and at bedtime.         Brief H and P: For complete details please refer to admission H and P, but in brief [place] JENNIFER PAYES is a 59 y.o. female with a history of A.Flutter, HTN, DM2 who presented to the [facility] ED with complaints of an episode of vomitng coffee ground emesis followed by several episodes of Melena since last night. She denies any ABD pain. She was evaluated in the ED  and her initial Hb was found to be 15, however she was tachycardic and had hypotension. She was administered IVFs and placed on a Protonix infusion and GI Dr Amedeo Plenty was consulted. She was transferred to [facility] Course:  Upper gastrointestinal bleed, acute blood loss anemia - Hemoglobin currently stable 8.4 at the time of discharge. Gastroenterology was consulted. Patient was seen by Dr. Amedeo Plenty. Patient underwent EGD which showed a small proximal gastric ulcer with hiatal hernia, distal antral gastritis, duodenitis with possible ulcer or erosion at the junction of the first and second portion. Surgical pathology showed small carcinoid tumor with ulcer in the gastric body. Patient will follow-up with Dr. Amedeo Plenty in office in 4 weeks regarding further management. Patient needs to continue PPI and Carafate for at least 2 months. She is tolerating  solid diet without any difficulty.  The patient was placed on iron supplementation.    Atrial flutter - Heart rate controlled, continue to hold aspirin due to GI bleeding. Patient will follow-up with GI Dr Amedeo Plenty, aspirin may be restarted per GI recommendations outpatient.   Essential hypertension, benign Currently stable   Diabetes type 2, uncontrolled -Patient was placed on sliding scale insulin while inpatient, hemoglobin A1c was 6.4.   Hypokalemia - Potassium was down to 2.9 at the time of admission, patient needed aggressive IV and oral replacement, 3.5 at the time of discharge.   hypomagnesemia  - Magnesium was down to 1.3 on 3/10, given IV replacement  3g x 1, placed on oral supplementation.  Day of Discharge BP 131/73 mmHg  Pulse 90  Temp(Src) 98.2 F (36.8 C) (Oral)  Resp 20  Ht 5\' 6"  (1.676 m)  Wt 65.1 kg (143 lb 8.3 oz)  BMI 23.18 kg/m2  SpO2 95%  Physical Exam: General: Alert and awake oriented x3 not in any acute distress. HEENT: anicteric sclera, pupils reactive to light and accommodation CVS: S1-S2 clear no murmur rubs or gallops Chest: clear to auscultation bilaterally, no wheezing rales or rhonchi Abdomen: soft nontender, nondistended, normal bowel sounds Extremities: no cyanosis, clubbing or edema noted bilaterally Neuro: Cranial nerves II-XII intact, no focal neurological deficits   The results of significant diagnostics from this hospitalization (including imaging, microbiology, ancillary and laboratory) are listed below for reference.    LAB RESULTS: Basic Metabolic Panel:  Recent Labs Lab 05/21/14 0050 05/21/14 1258 05/22/14 0556  NA 140  --  144  K 2.9* 3.3* 3.5  CL 108  --  108  CO2 28  --  29  GLUCOSE 104*  --  118*  BUN 13  --  6  CREATININE 0.40*  --  0.44*  CALCIUM 7.5*  --  8.1*  MG 1.3*  --  1.6   Liver Function Tests:  Recent Labs Lab 05/19/14 1330  AST 30  ALT 21  ALKPHOS 95  BILITOT 0.8  PROT 6.9  ALBUMIN  3.6   No results for input(s): LIPASE, AMYLASE in the last 168 hours. No results for input(s): AMMONIA in the last 168 hours. CBC:  Recent Labs Lab 05/19/14 1330  05/20/14 0351  05/21/14 0905 05/22/14 0650  WBC 6.2  --  7.2  --   --  5.8  NEUTROABS 3.6  --   --   --   --   --   HGB 15.8*  < > 9.7*  < > 9.3* 8.4*  HCT 44.3  < > 27.6*  < > 27.0* 24.1*  MCV 85.5  --  86.8  --   --  87.6  PLT 133*  --  83*  --   --  128*  < > = values in this interval not displayed. Cardiac Enzymes:  Recent Labs Lab 05/19/14 1330  TROPONINI <0.03   BNP: Invalid input(s): POCBNP CBG:  Recent Labs Lab 05/22/14 0444 05/22/14 0801  GLUCAP 107* 140*    Significant Diagnostic Studies:  Dg Chest Portable 1 View  05/19/2014   CLINICAL DATA:  59 year old female with weakness nausea and vomiting. Initial encounter.  EXAM: PORTABLE CHEST - 1 VIEW  COMPARISON:  12/21/2012 and earlier.  FINDINGS: Portable AP upright view at 1321 hrs. Normal lung volumes. Normal cardiac size and mediastinal contours. No pneumothorax or pneumoperitoneum. Allowing for portable technique, the lungs are clear. Visualized tracheal air column is within normal limits.  IMPRESSION: Negative, no acute cardiopulmonary abnormality.   Electronically Signed   By: Genevie Ann M.D.   On: 05/19/2014 13:43       Disposition and Follow-up: Discharge Instructions    Diet - low sodium heart healthy    Complete by:  As directed      Increase activity slowly    Complete by:  As directed             DISPOSITION:home   DISCHARGE FOLLOW-UP Follow-up Information    Follow up with Missy Sabins, MD. Go on 06/18/2014.   Specialty:  Gastroenterology   Why:  for hospital follow-up, Follow up after hospital stay   Contact information:   [address], [place] [address] [phone]       Follow up with Nance Pear., NP. Go on 06/02/2014.   Specialty:  Internal Medicine   Why:  1:30 pm on 06/02/14 Tuesday,  Follow up after hospital stay   Contact information:   [address] [place] [address] [phone]        Time spent on Discharge: 35 mins  Signed:   Lilliana Turner M.D. [facility] 05/22/2014, 12:06 PM Pager: [phone]

## 2014-05-29 ENCOUNTER — Ambulatory Visit (INDEPENDENT_AMBULATORY_CARE_PROVIDER_SITE_OTHER): Payer: Self-pay | Admitting: Adult Health

## 2014-05-29 ENCOUNTER — Telehealth: Payer: Self-pay | Admitting: Family

## 2014-05-29 ENCOUNTER — Encounter: Payer: Self-pay | Admitting: Adult Health

## 2014-05-29 VITALS — BP 134/78 | HR 90 | Resp 16 | Ht 64.5 in | Wt 139.6 lb

## 2014-05-29 DIAGNOSIS — E876 Hypokalemia: Secondary | ICD-10-CM

## 2014-05-29 DIAGNOSIS — K922 Gastrointestinal hemorrhage, unspecified: Secondary | ICD-10-CM

## 2014-05-29 LAB — CBC
HEMATOCRIT: 31.2 % — AB (ref 36.0–46.0)
Hemoglobin: 10.5 g/dL — ABNORMAL LOW (ref 12.0–15.0)
MCH: 29.5 pg (ref 26.0–34.0)
MCHC: 33.7 g/dL (ref 30.0–36.0)
MCV: 87.6 fL (ref 78.0–100.0)
MPV: 9.1 fL (ref 8.6–12.4)
Platelets: 640 10*3/uL — ABNORMAL HIGH (ref 150–400)
RBC: 3.56 MIL/uL — ABNORMAL LOW (ref 3.87–5.11)
RDW: 14.3 % (ref 11.5–15.5)
WBC: 11.5 10*3/uL — AB (ref 4.0–10.5)

## 2014-05-29 LAB — BASIC METABOLIC PANEL
BUN: 11 mg/dL (ref 6–23)
CALCIUM: 9.6 mg/dL (ref 8.4–10.5)
CO2: 27 mEq/L (ref 19–32)
Chloride: 103 mEq/L (ref 96–112)
Creat: 0.48 mg/dL — ABNORMAL LOW (ref 0.50–1.10)
Glucose, Bld: 97 mg/dL (ref 70–99)
POTASSIUM: 3.7 meq/L (ref 3.5–5.3)
Sodium: 142 mEq/L (ref 135–145)

## 2014-05-29 LAB — MAGNESIUM: Magnesium: 1.5 mg/dL (ref 1.5–2.5)

## 2014-05-29 MED ORDER — HYDROCHLOROTHIAZIDE 25 MG PO TABS: 25.0000 mg | ORAL_TABLET | Freq: Every day | ORAL | Status: DC

## 2014-05-29 MED ORDER — PANTOPRAZOLE SODIUM 40 MG PO TBEC: 40.0000 mg | DELAYED_RELEASE_TABLET | Freq: Every day | ORAL | Status: DC

## 2014-05-29 NOTE — Telephone Encounter (Signed)
I would recommend that pt be seen in the office today for visit. We need to check her blood pressure first in order to further advise.

## 2014-05-29 NOTE — Telephone Encounter (Signed)
Notified pt and she will see NP, Cory today at 1:30pm.

## 2014-05-29 NOTE — Patient Instructions (Addendum)
-   Stop by the lab on the way out to get your blood drawn - Start HCTZ 25 mg daily.  - Start Protonix 40 mg daily,  - In three days you can start the Carafate - Continue to take all the rest of your medications - I will contact you if there is any issues with the labs.  - Please make an appointment to follow up with your PCP in one month or sooner if you feel like you need to.  - If you have any issues with bleeding, please go to the ER. - I am glad you are feeling better!

## 2014-05-29 NOTE — Telephone Encounter (Signed)
Caller name: Richardine Relation to pt: Call back number: [phone] Pharmacy:  Reason for call:   Patient states that while she was in the hospital the physician told her to stop taking her bp medication and now her legs and feet are swelling and is wanting to know if she should go back on bp med

## 2014-05-29 NOTE — Assessment & Plan Note (Addendum)
Patient discharged from hospital on 05/22/14. She appears hemodynamically stable in the office today. She endorses feeling well. Denies any symptoms of GI bleed at this time. Does endorse intermittent left sided abdominal pain after eating, likely due to gastric ulcer. Unable to tolerate Carefate at this time.  Prescription for Protonix written and advised to trial Carafate again in three days. Will obtain a CBC. Advised to follow up in one month, keep a record of blood pressures and if she exhibits any signs or symptoms of bleeding to go straight to the ER.

## 2014-05-29 NOTE — Progress Notes (Signed)
Subjective:    [name], female    DOB: 1955-12-09, 59 y.o.   MRN: [id number]  HPI  [name] presents to the office following hospital admission for GI bleed. She was in the hospital for three days. During her hospital stay it was discovered that the patient had Proximal gastric ulcer with hiatal hernia, distal antral gastritis, duodenitis with possible ulcer and small carcinoid tumor with ulcer in the gastric body with duodenitis. She was also diagnosed with hypokalemia in the hospital, down to 2.9 and hpomagnesemia ( 1.3). She was replaced with both.   On follow up today, she endorses feeling better, but is still weak. She is going to work for six hour a day. Her appertite is improving and she is drinking a lot more water than she used to.   She complains of ankle swelling L>R that started yesterday, in the hospital she was told to discontinue to lisinopril-HCTZ.   She also endorses being told to start Protonix but was never given a prescription on discharge. She has not taken protonix in seven days. Also endorses vomiting right after taking carafate.   She denies any GI bleeding at this time.   Hospital admission and discharge notes reviewed during this visit.    Review of Systems  Constitutional: Positive for activity change, appetite change and fatigue. Negative for fever and chills.  Respiratory: Negative for shortness of breath.   Cardiovascular: Positive for leg swelling. Negative for chest pain and palpitations.       Bilateral ankle swelling  Gastrointestinal: Positive for vomiting and abdominal pain. Negative for nausea, diarrhea, constipation, blood in stool, abdominal distention, anal bleeding and rectal pain.       Left lower quadrant after eating  Genitourinary: Negative for flank pain.  Musculoskeletal: Negative for myalgias, joint swelling, neck pain and neck stiffness.  Skin: Negative for color change.  Neurological: Positive for weakness.  Negative for dizziness, syncope and headaches.   Past Medical History  Diagnosis Date  . Hypertension   . Diabetes mellitus without complication   . Chest pain   . Atrial flutter     s/p CTI by Dr Lovena Le 02/2013    History   Social History  . Marital Status: Married    Spouse Name: N/A  . Number of Children: N/A  . Years of Education: N/A   Occupational History  . Not on file.   Social History Main Topics  . Smoking status: Former Research scientist (life sciences)  . Smokeless tobacco: Not on file  . Alcohol Use: No  . Drug Use: No  . Sexual Activity: Not on file   Other Topics Concern  . Not on file   Social History Narrative   Married   Clinical cytogeneticist- full time   Daughter- 2 grandchildren, live in [place]   Enjoys playing on computer.      Past Surgical History  Procedure Laterality Date  . Ablation  03-04-2013    CTI by Dr Lovena Le for atrial flutter  . Tonsillectomy  1972 ?  Marland Kitchen Cholecystectomy  1982  . Atrial flutter ablation N/A 03/04/2013    Procedure: ATRIAL FLUTTER ABLATION;  Surgeon: Evans Lance, MD;  Location: [facility] CATH LAB;  Service: Cardiovascular;  Laterality: N/A;  . Esophagogastroduodenoscopy N/A 05/20/2014    Procedure: ESOPHAGOGASTRODUODENOSCOPY (EGD);  Surgeon: Teena Irani, MD;  Location: [facility];  Service: Endoscopy;  Laterality: N/A;    Family History  Problem Relation Age of Onset  . Atrial fibrillation Mother   .  Arrhythmia Mother   . Diabetes Mother   . Hodgkin's lymphoma Father     Allergies  Allergen Reactions  . Jardiance [Empagliflozin]     Causes heart palpitations    Current Outpatient Prescriptions on File Prior to Visit  Medication Sig Dispense Refill  . amLODipine (NORVASC) 10 MG tablet Take 0.5 tablets (5 mg total) by mouth daily. Or as directed 30 tablet 6  . Bioflavonoid Products (ESTER C PO) Take 1 tablet by mouth daily.    . Calcium Carbonate-Vitamin D (CALTRATE 600+D PO) Take 1 tablet by mouth 2 (two) times daily.    . carvedilol (COREG)  12.5 MG tablet Take 1 tablet (12.5 mg total) by mouth 2 (two) times daily. 180 tablet 3  . ferrous gluconate (FERGON) 324 MG tablet Take 1 tablet (324 mg total) by mouth 2 (two) times daily with a meal. 60 tablet 3  . flecainide (TAMBOCOR) 50 MG tablet TAKE ONE TABLET BY MOUTH TWICE DAILY 180 tablet 0  . glimepiride (AMARYL) 4 MG tablet TAKE ONE TABLET BY MOUTH ONCE DAILY BEFORE BREAKFAST 30 tablet 3  . magnesium oxide (MAG-OX) 400 (241.3 MG) MG tablet Take 1 tablet (400 mg total) by mouth daily. 30 tablet 1  . metFORMIN (GLUCOPHAGE) 500 MG tablet Take 2 tablets by mouth twice a day. 120 tablet 3  . Omega-3 Fatty Acids (FISH OIL) 1200 MG CAPS Take 1,200 mg by mouth daily.     . sucralfate (CARAFATE) 1 GM/10ML suspension Take 10 mLs (1 g total) by mouth 4 (four) times daily -  with meals and at bedtime. 420 mL 2   No current facility-administered medications on file prior to visit.    BP 134/78 mmHg  Pulse 90  Resp 16  Ht 5' 4.5" (1.638 m)  Wt 139 lb 9.6 oz (63.322 kg)  BMI 23.60 kg/m2  SpO2 96%       Objective:   Physical Exam  Physical Exam  Constitutional: She is oriented to person, place, and time. She appears well-developed but weak from hospital stay. No distress.  HENT:  Head: Normocephalic and atraumatic.  Right Ear: Tympanic membrane and ear canal normal.  Left Ear: Tympanic membrane and ear canal normal.  Mouth/Throat: Oropharynx is clear and moist.  Eyes: Pupils are equal, round, and reactive to light. No scleral icterus.  Neck: Normal range of motion. No cervical adenopathy Cardiovascular: Normal rate and regular rhythm.   No murmur, rales or rub heard. Pulmonary/Chest: Effort normal, inspiatory and expiatory wheezing hear. No respiratory distress. She has no rales. She exhibits no tenderness.  Abdominal: Soft. Bowel sounds are normal. He exhibits no distension and no mass. There is intermittent pain in left quadrant. No present with palpation. No tenderness. There is  no rebound and no guarding.  Musculoskeletal: She exhibits non pitting edema in bilateral anklesL >.R  Lymphadenopathy:    She has no cervical adenopathy.  Neurological: She is alert and oriented to person, place, and time. She has normal reflexes. She exhibits normal muscle tone. Coordination normal.  Skin: Skin is warm and dry.  Psychiatric: She has a normal mood and affect. Her behavior is normal. Judgment and thought content normal.             Assessment & Plan:           Assessment & Plan:

## 2014-05-29 NOTE — Assessment & Plan Note (Signed)
Continue to take K-Dur are prescribed. Going to restart HCTZ due to swelling in ankles. Will get BMP today and then continue to follow up on potassium level  During visit in one month- will speak to patient about starting low dose ACE-I

## 2014-05-29 NOTE — Progress Notes (Signed)
Pre visit review using our clinic review tool, if applicable. No additional management support is needed unless otherwise documented below in the visit note. 

## 2014-06-01 ENCOUNTER — Telehealth: Payer: Self-pay | Admitting: Adult Health

## 2014-06-01 ENCOUNTER — Ambulatory Visit (HOSPITAL_COMMUNITY): Payer: Self-pay

## 2014-06-01 ENCOUNTER — Ambulatory Visit (INDEPENDENT_AMBULATORY_CARE_PROVIDER_SITE_OTHER): Payer: Self-pay | Admitting: Adult Health

## 2014-06-01 ENCOUNTER — Ambulatory Visit (HOSPITAL_BASED_OUTPATIENT_CLINIC_OR_DEPARTMENT_OTHER)
Admission: RE | Admit: 2014-06-01 | Discharge: 2014-06-01 | Disposition: A | Discharge status: Home / Self Care | Payer: Self-pay | Source: Ambulatory Visit | Attending: Adult Health | Admitting: Adult Health

## 2014-06-01 ENCOUNTER — Encounter: Payer: Self-pay | Admitting: Adult Health

## 2014-06-01 ENCOUNTER — Encounter: Payer: Self-pay | Admitting: Family

## 2014-06-01 ENCOUNTER — Encounter (HOSPITAL_BASED_OUTPATIENT_CLINIC_OR_DEPARTMENT_OTHER): Payer: Self-pay

## 2014-06-01 ENCOUNTER — Telehealth: Payer: Self-pay | Admitting: *Deleted

## 2014-06-01 VITALS — BP 138/76 | HR 86 | Temp 97.6°F | Resp 16 | Ht 64.5 in

## 2014-06-01 DIAGNOSIS — M7989 Other specified soft tissue disorders: Secondary | ICD-10-CM | POA: Insufficient documentation

## 2014-06-01 DIAGNOSIS — R0602 Shortness of breath: Secondary | ICD-10-CM | POA: Insufficient documentation

## 2014-06-01 DIAGNOSIS — R600 Localized edema: Secondary | ICD-10-CM

## 2014-06-01 DIAGNOSIS — R791 Abnormal coagulation profile: Secondary | ICD-10-CM

## 2014-06-01 DIAGNOSIS — R609 Edema, unspecified: Secondary | ICD-10-CM

## 2014-06-01 DIAGNOSIS — R7989 Other specified abnormal findings of blood chemistry: Secondary | ICD-10-CM

## 2014-06-01 DIAGNOSIS — D72829 Elevated white blood cell count, unspecified: Secondary | ICD-10-CM

## 2014-06-01 LAB — CBC WITH DIFFERENTIAL/PLATELET
BASOS PCT: 0 % (ref 0–1)
Basophils Absolute: 0 10*3/uL (ref 0.0–0.1)
EOS ABS: 0.1 10*3/uL (ref 0.0–0.7)
EOS PCT: 1 % (ref 0–5)
HEMATOCRIT: 33.1 % — AB (ref 36.0–46.0)
Hemoglobin: 11.1 g/dL — ABNORMAL LOW (ref 12.0–15.0)
Lymphocytes Relative: 25 % (ref 12–46)
Lymphs Abs: 2.6 10*3/uL (ref 0.7–4.0)
MCH: 30.2 pg (ref 26.0–34.0)
MCHC: 33.5 g/dL (ref 30.0–36.0)
MCV: 90.2 fL (ref 78.0–100.0)
MONOS PCT: 4 % (ref 3–12)
MPV: 9.2 fL (ref 8.6–12.4)
Monocytes Absolute: 0.4 10*3/uL (ref 0.1–1.0)
NEUTROS ABS: 7.3 10*3/uL (ref 1.7–7.7)
Neutrophils Relative %: 70 % (ref 43–77)
PLATELETS: 583 10*3/uL — AB (ref 150–400)
RBC: 3.67 MIL/uL — ABNORMAL LOW (ref 3.87–5.11)
RDW: 14.4 % (ref 11.5–15.5)
WBC: 10.4 10*3/uL (ref 4.0–10.5)

## 2014-06-01 LAB — BASIC METABOLIC PANEL
BUN: 12 mg/dL (ref 6–23)
CO2: 24 mEq/L (ref 19–32)
Calcium: 9.7 mg/dL (ref 8.4–10.5)
Chloride: 102 mEq/L (ref 96–112)
Creat: 0.71 mg/dL (ref 0.50–1.10)
GLUCOSE: 96 mg/dL (ref 70–99)
POTASSIUM: 3.9 meq/L (ref 3.5–5.3)
Sodium: 138 mEq/L (ref 135–145)

## 2014-06-01 LAB — D-DIMER, QUANTITATIVE (NOT AT ARMC): D DIMER QUANT: 1.36 ug{FEU}/mL — AB (ref 0.00–0.48)

## 2014-06-01 IMAGING — CT CT ANGIO CHEST
2 of 6 series · 19 of 36 positions shown · IV contrast (APPLIED)
Comparison: None.

CLINICAL DATA: Bilateral lower extremity swelling. Shortness of
breath. Elevated D-dimer.

EXAM:
CT ANGIOGRAPHY CHEST WITH CONTRAST
TECHNIQUE: Multidetector CT imaging of the chest was performed using the
standard protocol during bolus administration of intravenous
contrast. Multiplanar CT image reconstructions and MIPs were
obtained to evaluate the vascular anatomy.
CONTRAST:  100mL OMNIPAQUE IOHEXOL 350 MG/ML SOLN

[Series 6: pe 1.0 b26f · axial · 0.64mm/px · z∈[-293,-59]mm · 18 of 262 slices shown]
[im 14/262  lung]
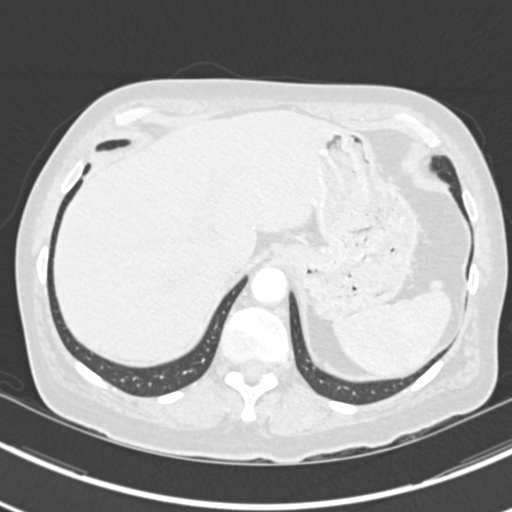
[im 27/262  mediastinal]
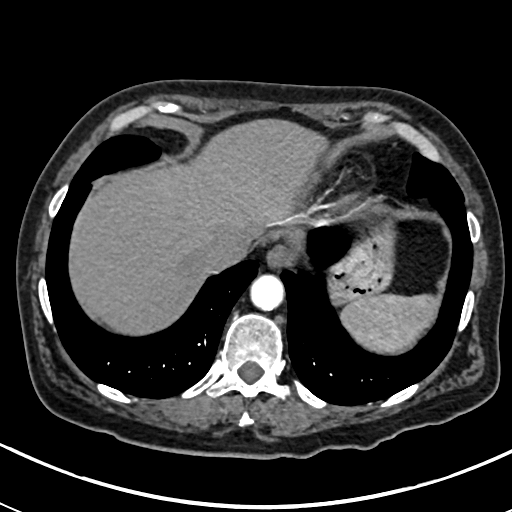
[im 40/262  lung]
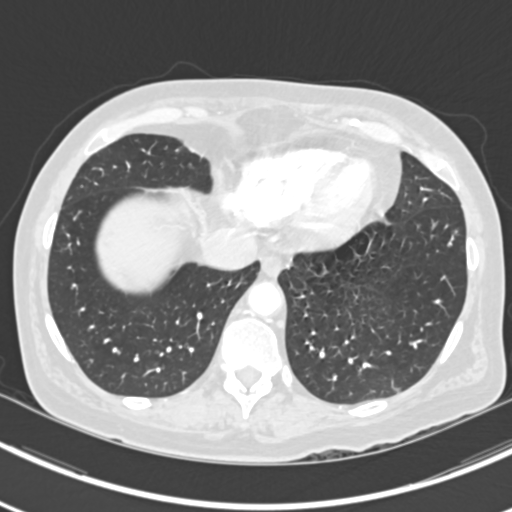
[im 53/262  mediastinal]
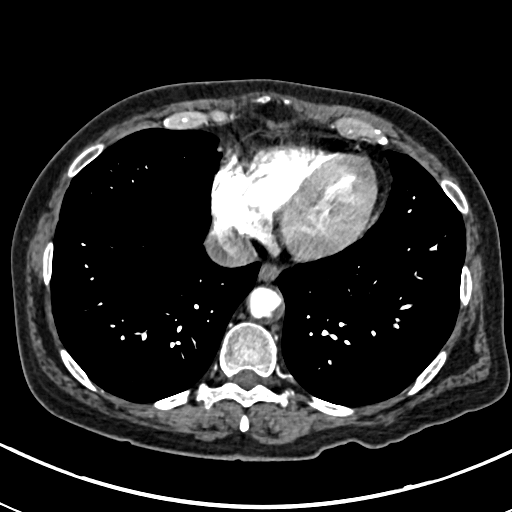
[im 66/262  lung]
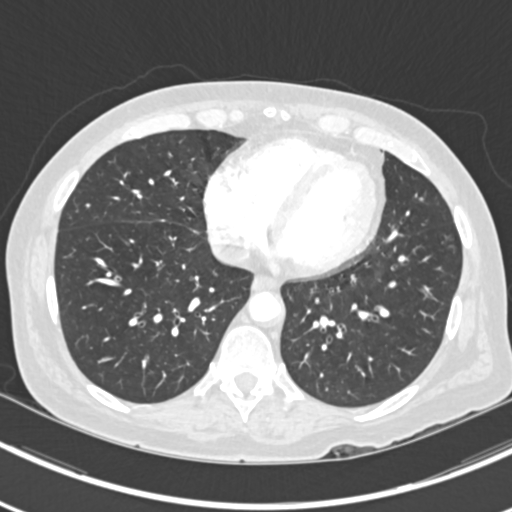
[im 79/262  mediastinal]
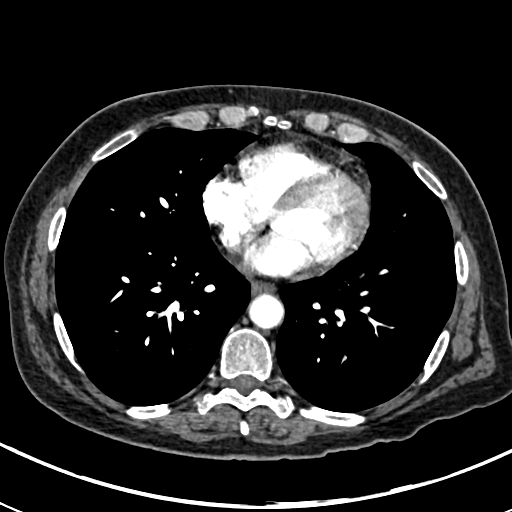
[im 92/262  lung]
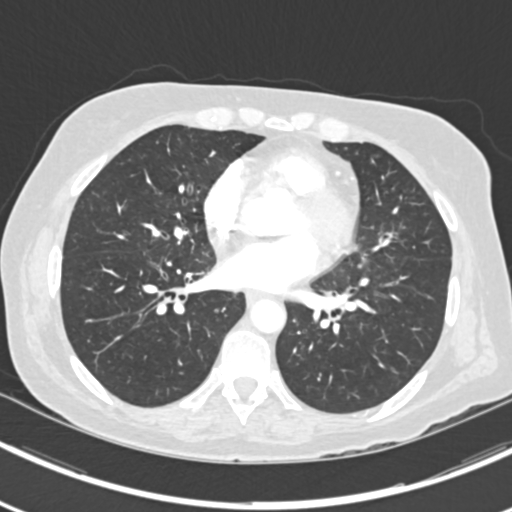
[im 105/262  mediastinal]
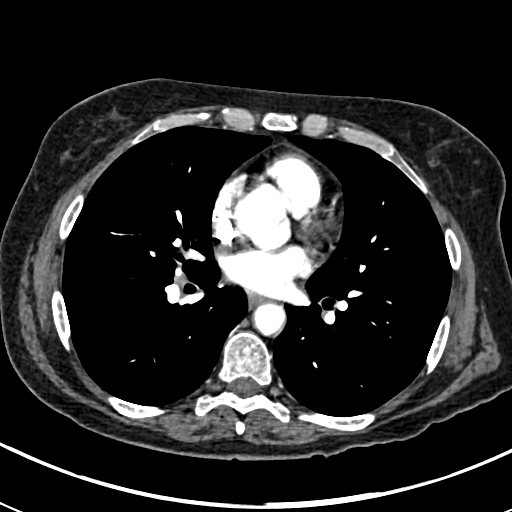
[im 118/262  lung]
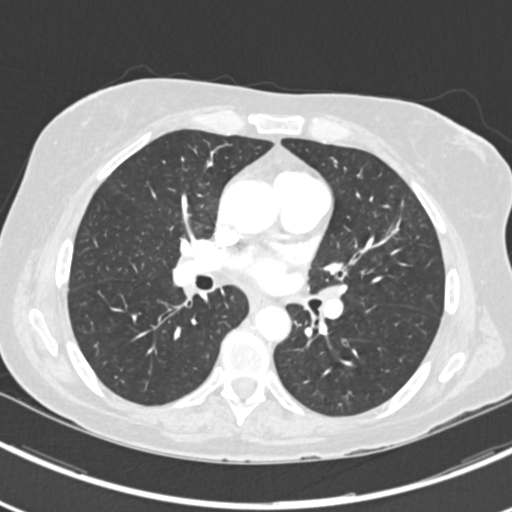
[im 144/262  mediastinal]
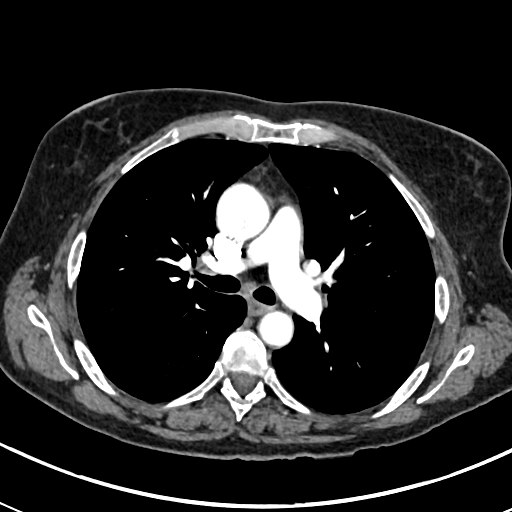
[im 157/262  lung]
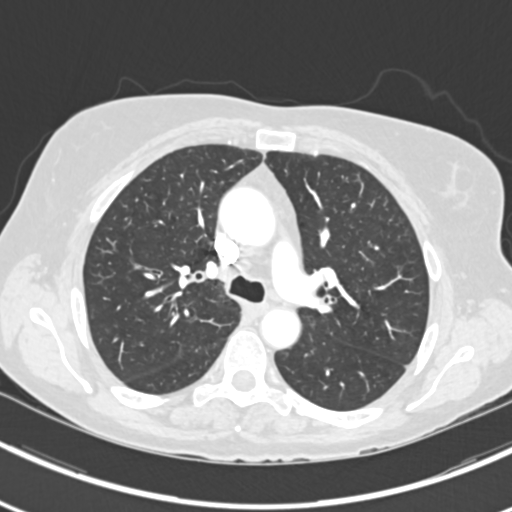
[im 170/262  mediastinal]
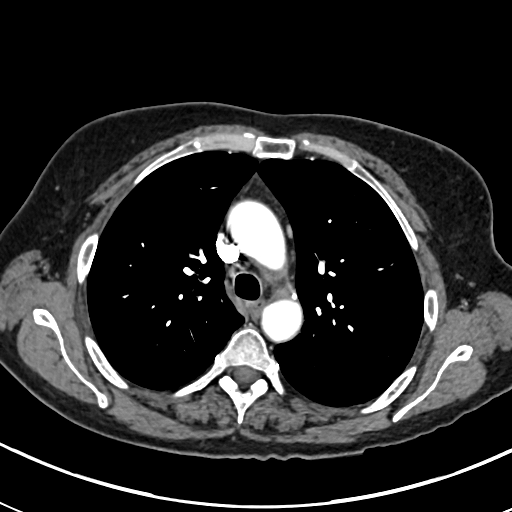
[im 183/262  lung]
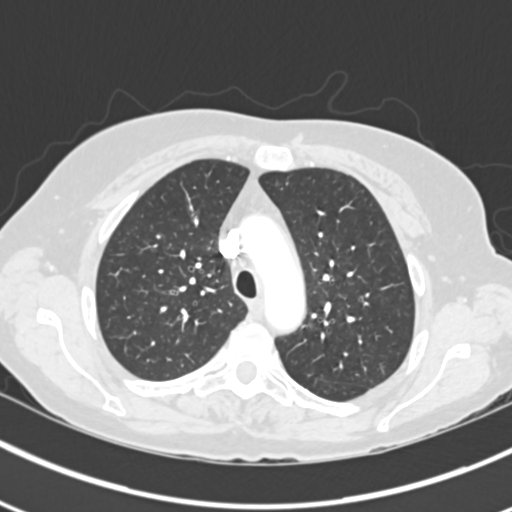
[im 196/262  mediastinal]
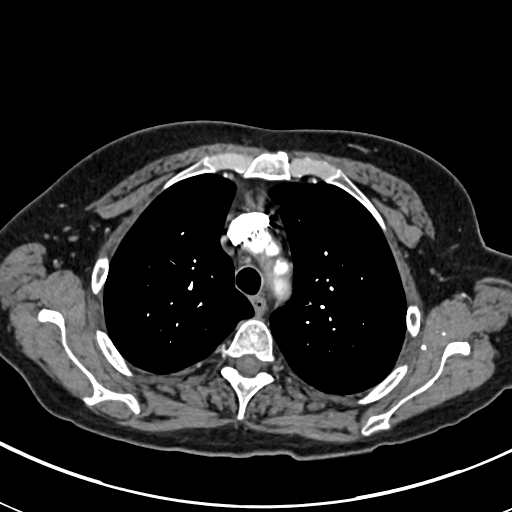
[im 209/262  lung]
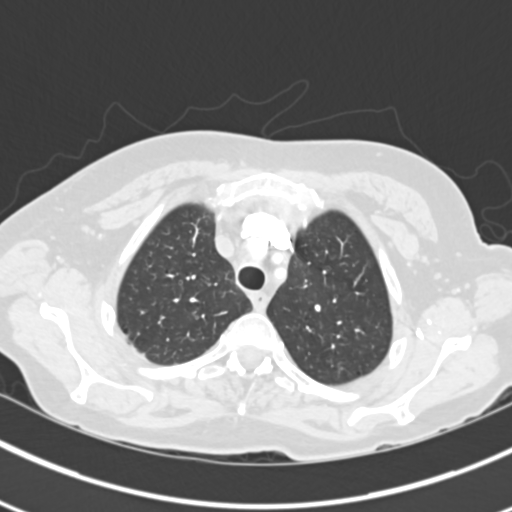
[im 222/262  mediastinal]
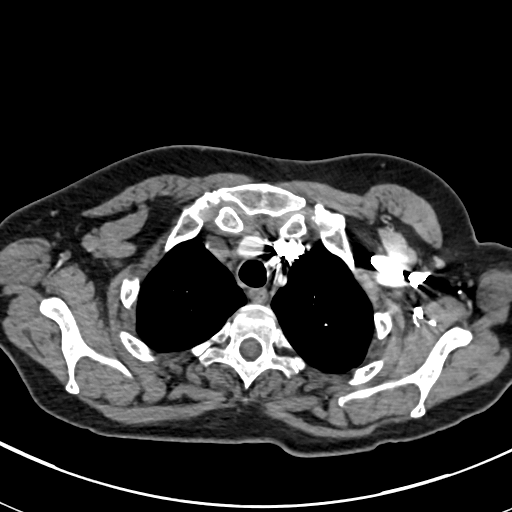
[im 235/262  lung]
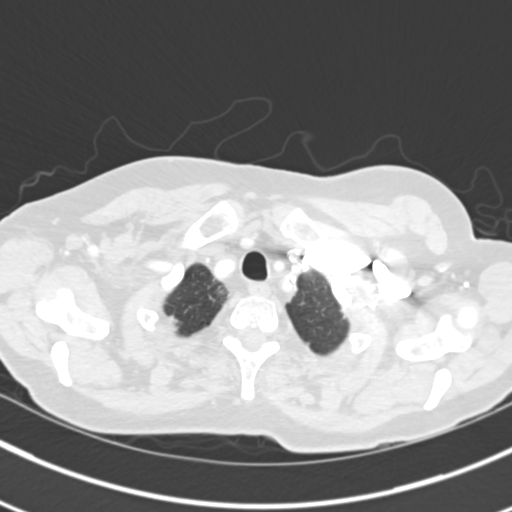
[im 248/262  mediastinal]
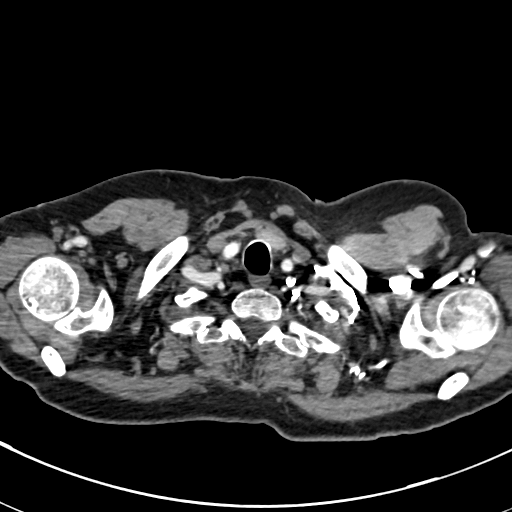

[Series 9: pe 2.0 coronal · coronal · 0.52mm/px · 1 of 110 slices shown]
[im 55/110  mediastinal]
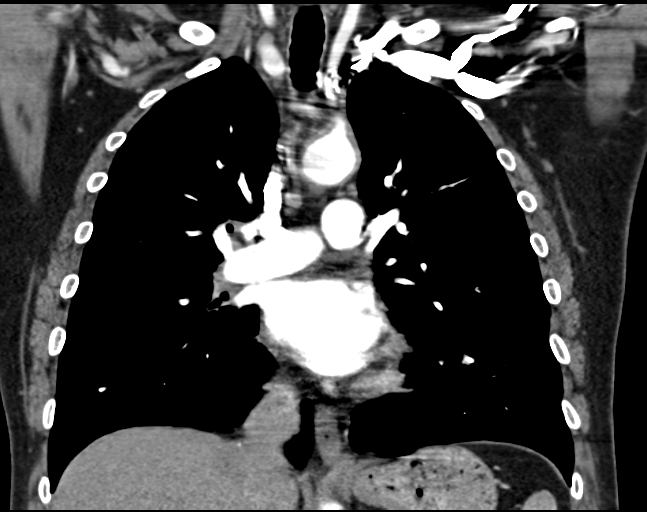

[19 of 36 positions shown; findings below may reference images not displayed]

FINDINGS: There is adequate opacification of the pulmonary arteries. There is
no pulmonary embolus. The main pulmonary artery, right main
pulmonary artery and left main pulmonary arteries are normal in
size. The heart size is normal. There is no pericardial effusion.

The lungs are clear. There is no focal consolidation, pleural
effusion or pneumothorax.

There is no axillary, hilar, or mediastinal adenopathy.

There is no lytic or blastic osseous lesion.

The visualized portions of the upper abdomen are unremarkable.

Review of the MIP images confirms the above findings.
IMPRESSION: 1. No evidence of pulmonary embolus.

## 2014-06-01 IMAGING — US US EXTREM LOW VENOUS BILAT
1 series · 13 of 24 positions shown · non-contrast
Comparison: None.

CLINICAL DATA: Bilateral lower extremity swelling and shortness of
breath for 4 days. Recent hospitalization. Initial encounter.



[Series 1: us extrem low venous bilat · 0.07mm/px · 48 acquisitions, 13 frames shown]
[im 1/48]
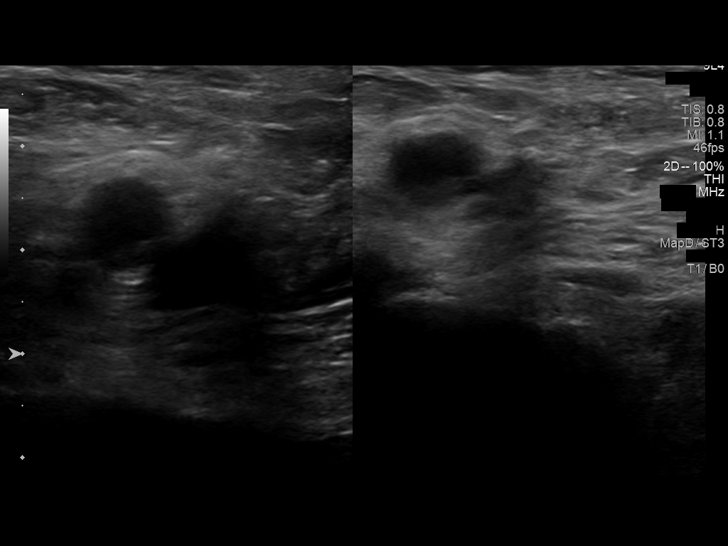
[im 5/48]
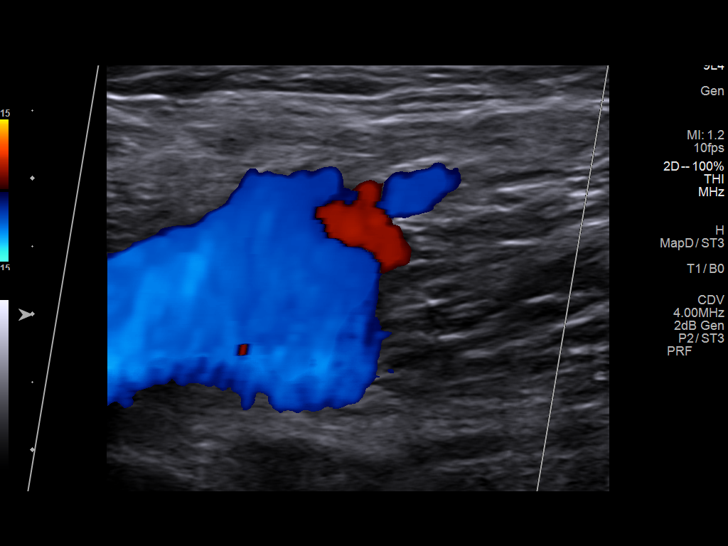
[im 9/48]
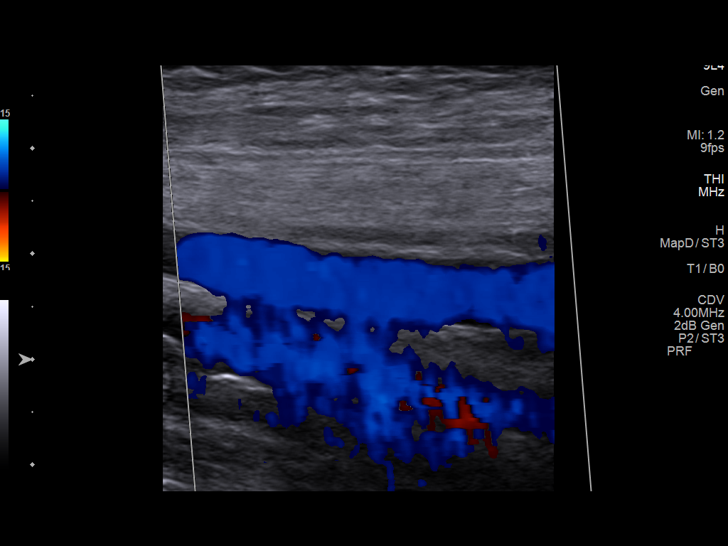
[im 15/48]
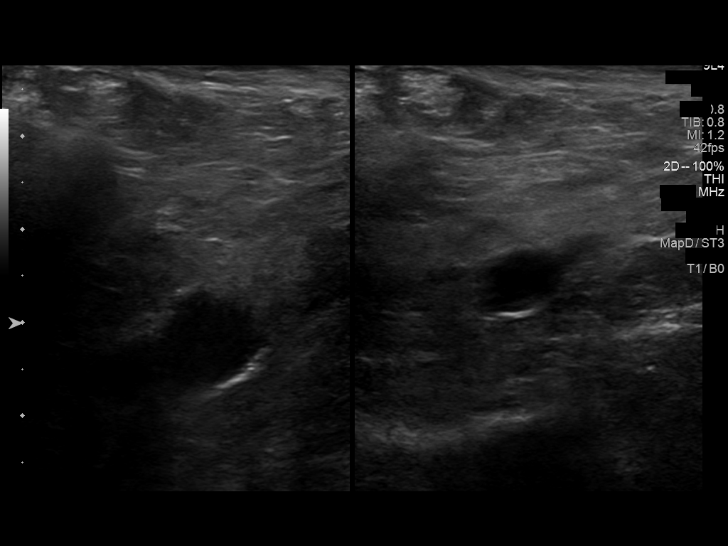
[im 19/48]
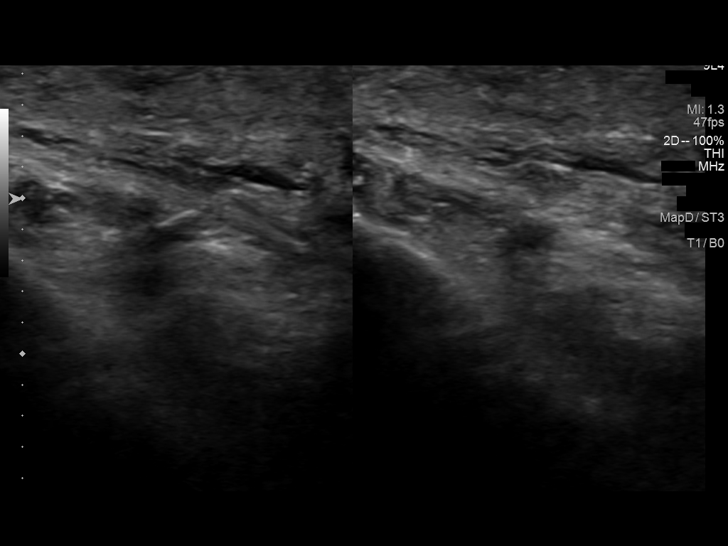
[im 21/48]
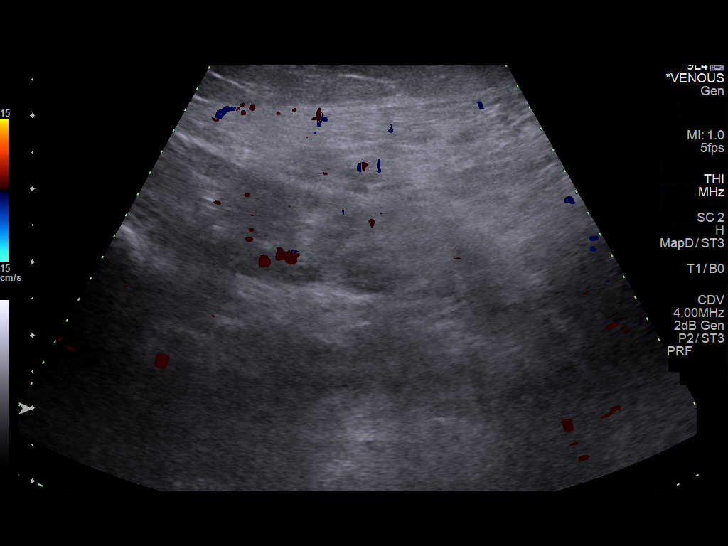
[im 25/48]
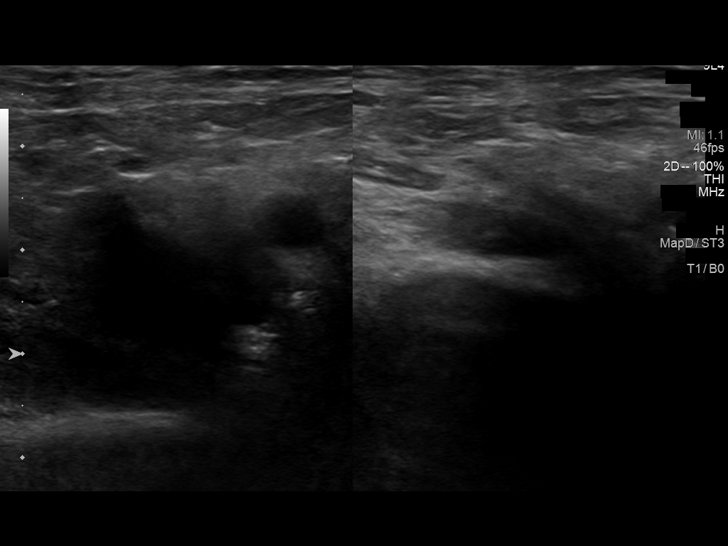
[im 27/48]
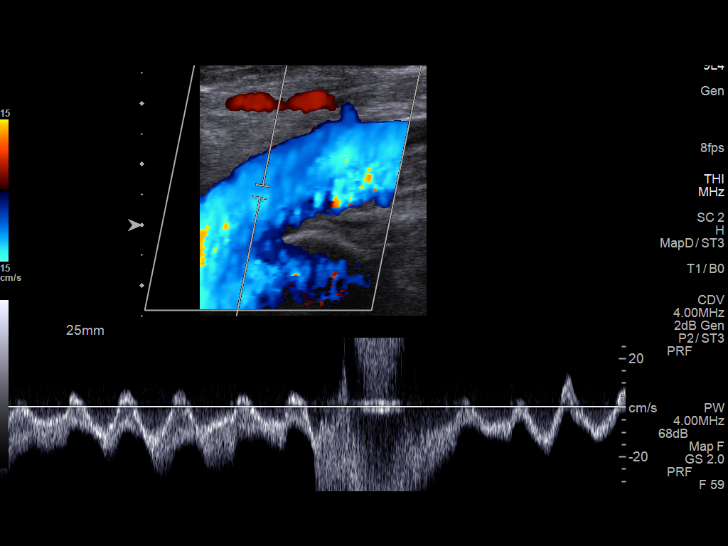
[im 31/48]
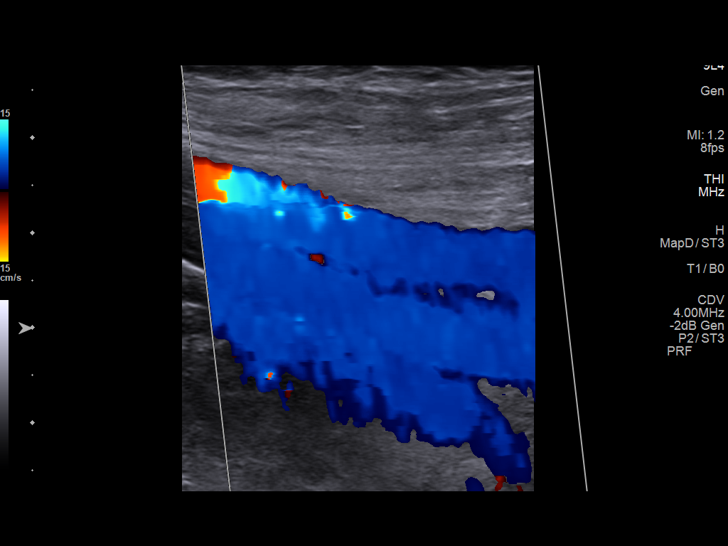
[im 35/48]
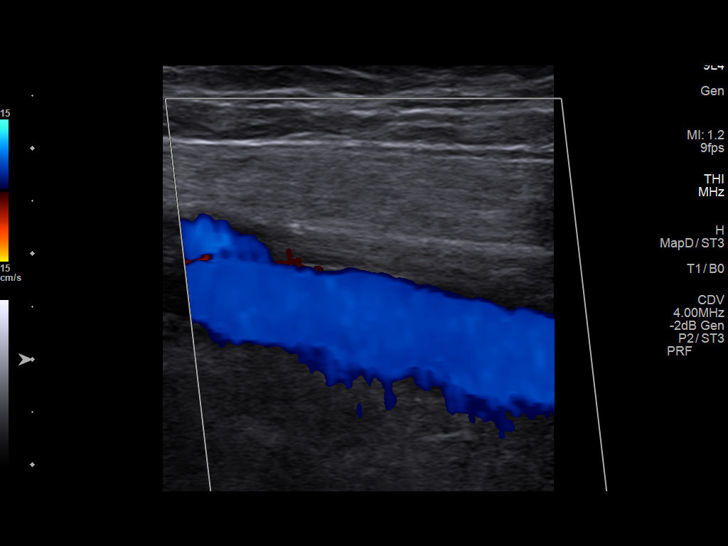
[im 41/48]
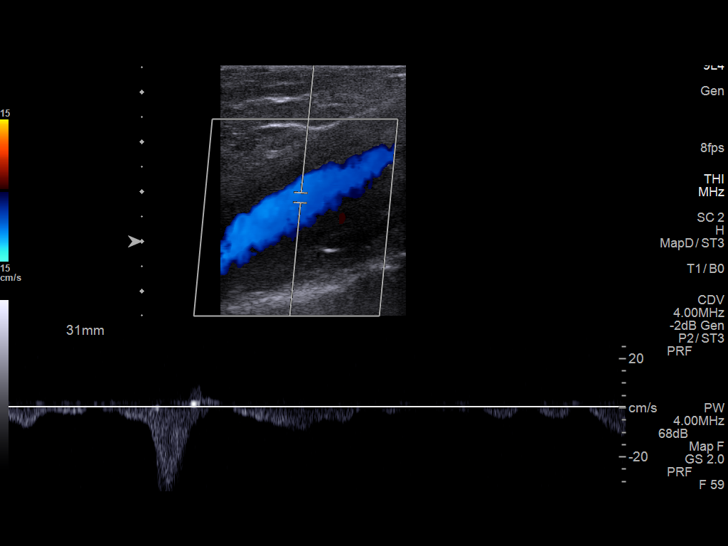
[im 45/48]
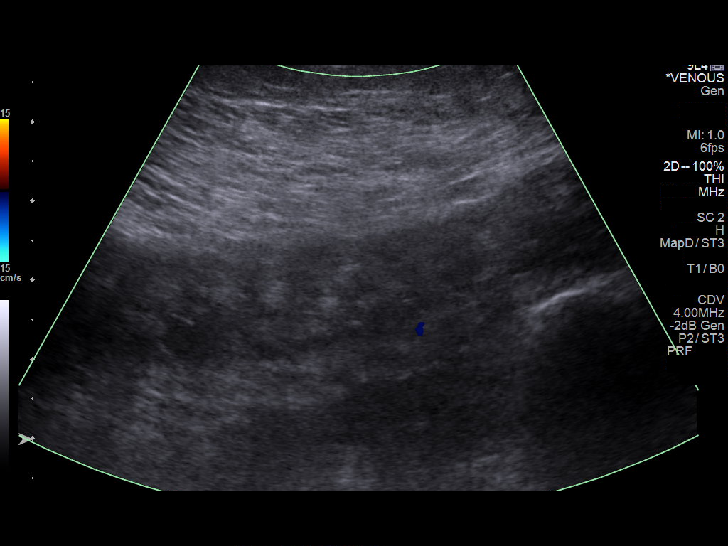
[im 48/48]
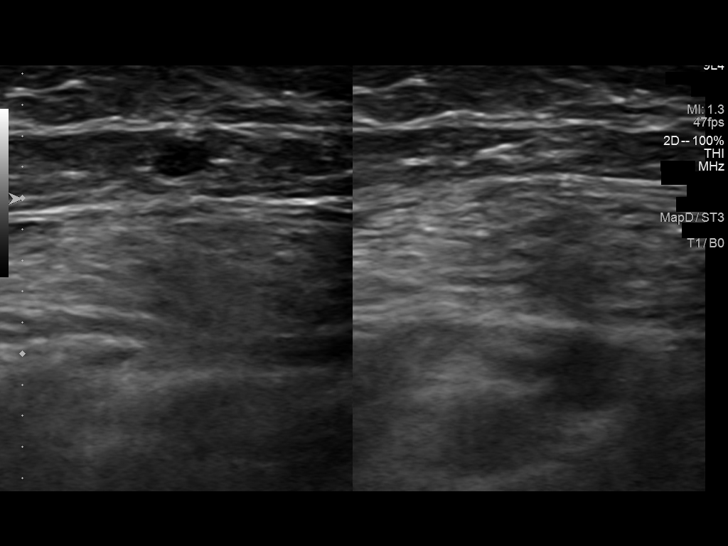

[13 of 24 positions shown; findings below may reference images not displayed]

FINDINGS: RIGHT LOWER EXTREMITY

Common Femoral Vein: No evidence of thrombus. Normal
compressibility, respiratory phasicity and response to augmentation.

Saphenofemoral Junction: No evidence of thrombus. Normal
compressibility and flow on color Doppler imaging.

Profunda Femoral Vein: No evidence of thrombus. Normal
compressibility and flow on color Doppler imaging.

Femoral Vein: No evidence of thrombus. Normal compressibility,
respiratory phasicity and response to augmentation.

Popliteal Vein: No evidence of thrombus. Normal compressibility,
respiratory phasicity and response to augmentation.

Calf Veins: No evidence of thrombus. Normal compressibility and flow
on color Doppler imaging.

Superficial Great Saphenous Vein: No evidence of thrombus. Normal
compressibility and flow on color Doppler imaging.

Venous Reflux:  None.

Other Findings:  None.

LEFT LOWER EXTREMITY

Common Femoral Vein: No evidence of thrombus. Normal
compressibility, respiratory phasicity and response to augmentation.

Saphenofemoral Junction: No evidence of thrombus. Normal
compressibility and flow on color Doppler imaging.

Profunda Femoral Vein: No evidence of thrombus. Normal
compressibility and flow on color Doppler imaging.

Femoral Vein: No evidence of thrombus. Normal compressibility,
respiratory phasicity and response to augmentation.

Popliteal Vein: No evidence of thrombus. Normal compressibility,
respiratory phasicity and response to augmentation.

Calf Veins: No evidence of thrombus. Normal compressibility and flow
on color Doppler imaging.

Superficial Great Saphenous Vein: No evidence of thrombus. Normal
compressibility and flow on color Doppler imaging.

Venous Reflux:  None.

Other Findings:  None.
IMPRESSION: No evidence of deep venous thrombosis.

## 2014-06-01 IMAGING — CR DG CHEST 2V
2 series · 2 of 2 positions shown · non-contrast
Comparison: [DATE]; [DATE]

CLINICAL DATA: Left leg swelling and shortness of breath for 2
days.

EXAM:
CHEST  2 VIEW

[w chest pa]
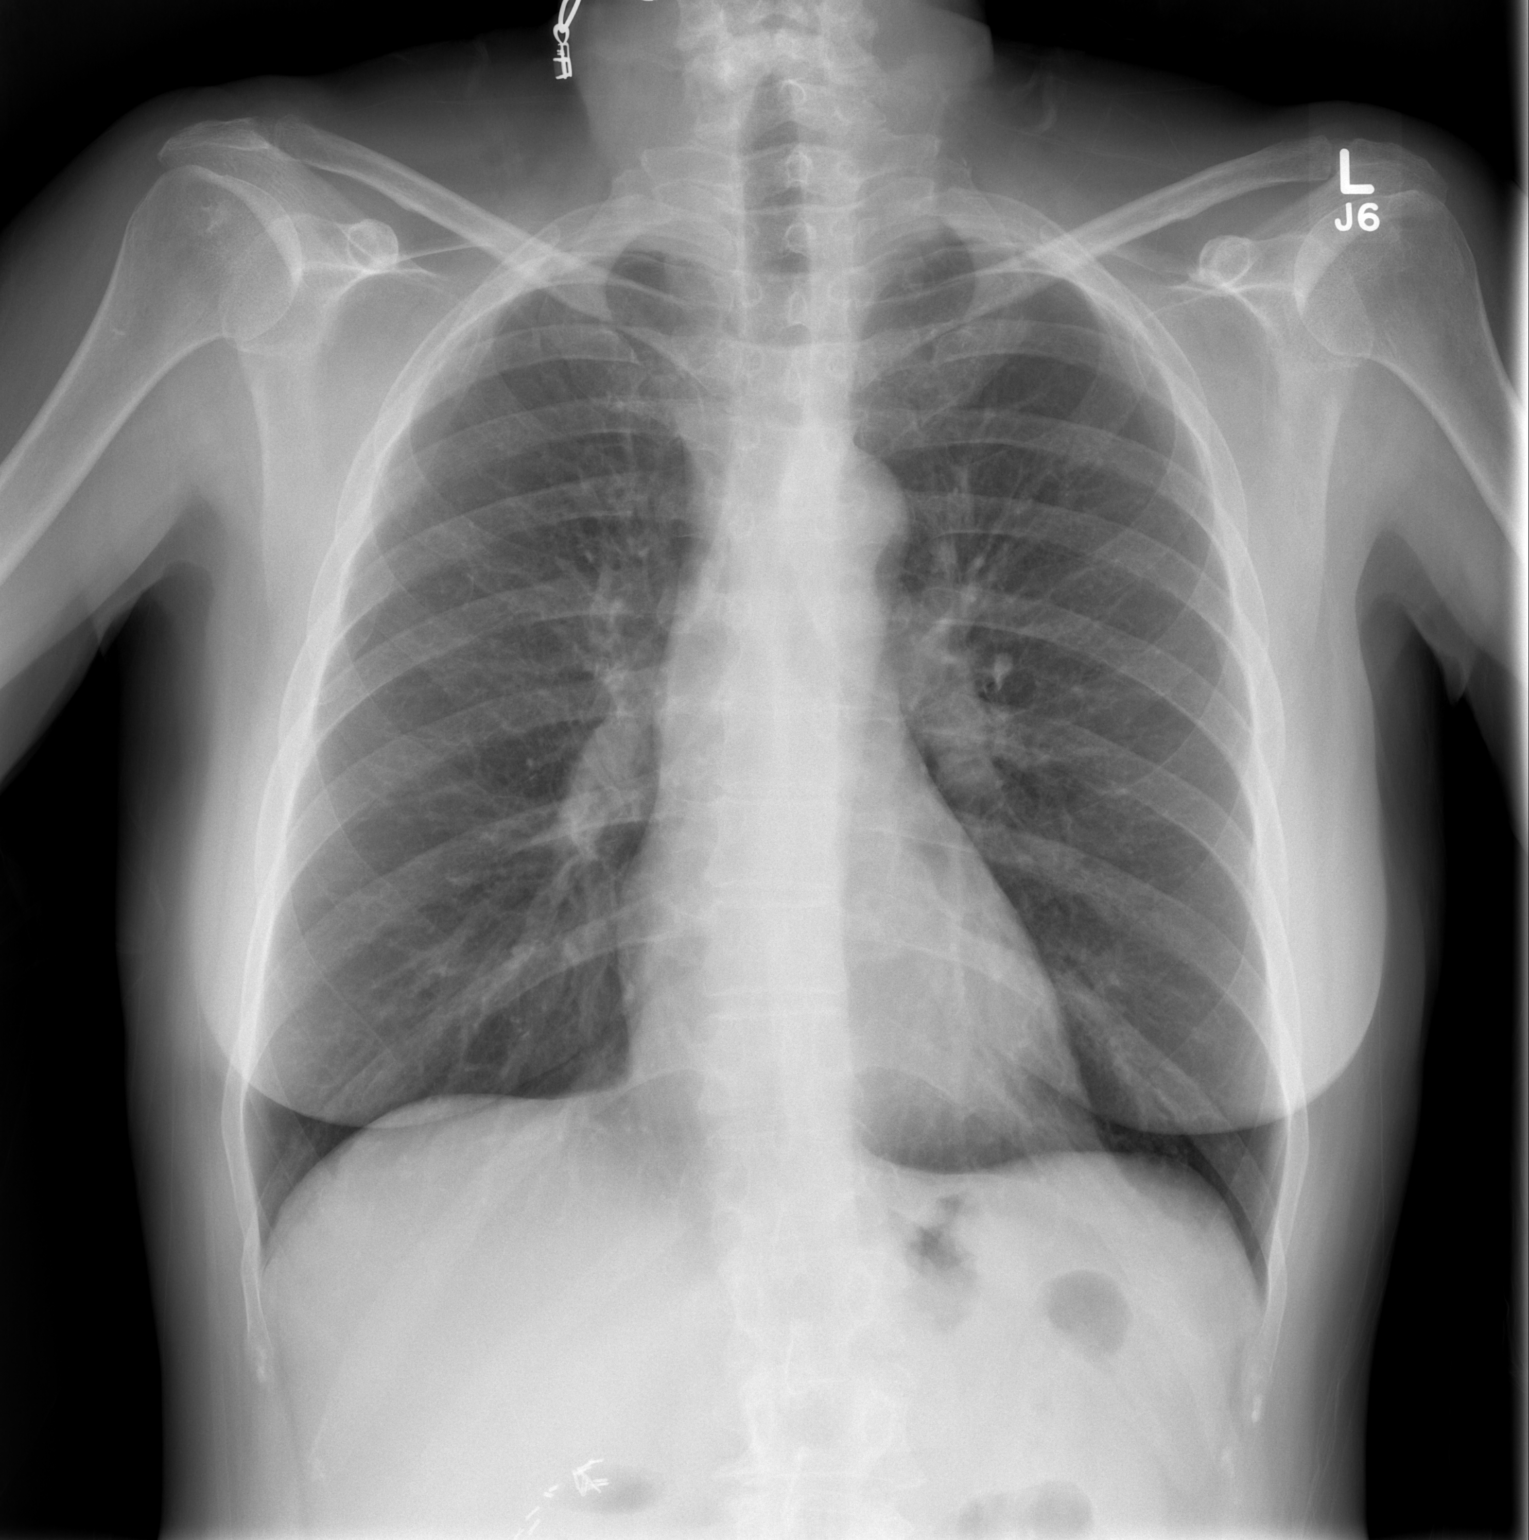

[w chest lat]
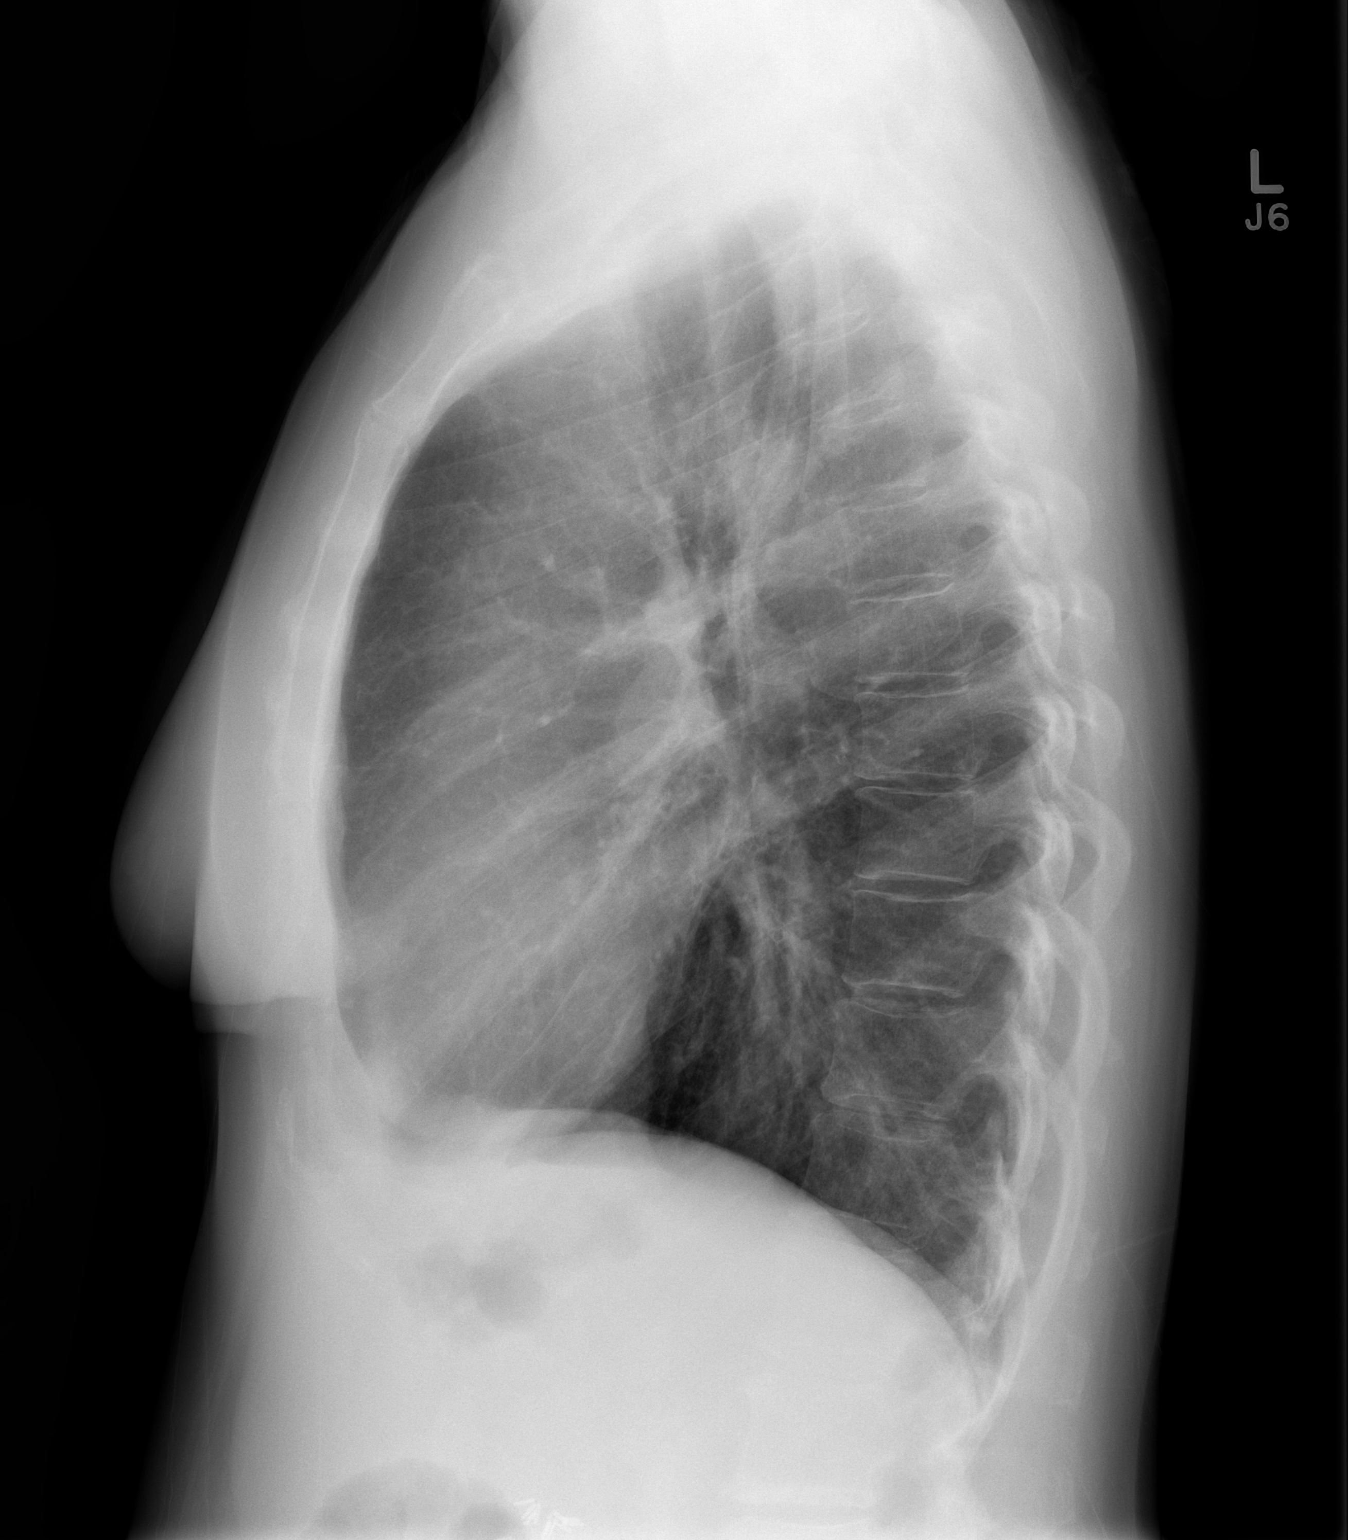

[2 of 2 positions shown; findings below may reference images not displayed]

FINDINGS: Grossly unchanged cardiac silhouette and mediastinal contours. No
focal airspace opacities. No pleural effusion or pneumothorax. No
evidence of edema. No acute osseus abnormalities. Mild scoliotic
curvature of the thoracolumbar spine. Postcholecystectomy.
IMPRESSION: No acute cardiopulmonary disease.

## 2014-06-01 MED ORDER — IOHEXOL 350 MG/ML SOLN
100.0000 mL | Freq: Once | INTRAVENOUS | Status: AC | PRN
  Administered 2014-06-01: 100 mL via INTRAVENOUS

## 2014-06-01 MED ORDER — LORAZEPAM 0.5 MG PO TABS: 0.5000 mg | ORAL_TABLET | Freq: Four times a day (QID) | ORAL | Status: DC | PRN

## 2014-06-01 MED ORDER — FUROSEMIDE 20 MG PO TABS: 20.0000 mg | ORAL_TABLET | Freq: Every day | ORAL | Status: DC

## 2014-06-01 NOTE — Progress Notes (Addendum)
   Subjective:    [name], female    DOB: 09/20/1955, 59 y.o.   MRN: [id number]  HPI  Christy Abbott presents to the office today following last weeks appointment (hospital follow up) for increasing SOB and lower extremity swelling. She was started on HCTZ last week for her ankle swelling. She reports that she is urinating more but that the edema in her lower extremities is increasing. The swelling in worse in the night even with leg elevation. She also reports that she has become more short of breath over the last few days and is only able to walk a short distance before becoming short of breath. Per patients husband " she said last night that she thought she was going to die last night because she was so short of breath." Patient then states " I think part of it was my anxiety." She denies any chest pain, palpitations or claudication at this time. Denies any pain in calf or redness/warmth in lower extremities  Review of Systems  Constitutional: Positive for activity change and fatigue. Negative for fever, appetite change and unexpected weight change.  Respiratory: Positive for chest tightness, shortness of breath and wheezing.   Cardiovascular: Positive for leg swelling. Negative for chest pain and palpitations.  Genitourinary: Positive for frequency.  Neurological: Negative for dizziness, syncope, light-headedness and headaches.   Past Medical History  Diagnosis Date  . Hypertension   . Diabetes mellitus without complication   . Chest pain   . Atrial flutter     s/p CTI by Dr Lovena Le 02/2013      Past Surgical History  Procedure Laterality Date  . Ablation  03-04-2013    CTI by Dr Lovena Le for atrial flutter  . Tonsillectomy  1972 ?  Marland Kitchen Cholecystectomy  1982  . Atrial flutter ablation N/A 03/04/2013    Procedure: ATRIAL FLUTTER ABLATION;  Surgeon: Evans Lance, MD;  Location: [facility] CATH LAB;  Service: Cardiovascular;  Laterality: N/A;  . Esophagogastroduodenoscopy N/A  05/20/2014    Procedure: ESOPHAGOGASTRODUODENOSCOPY (EGD);  Surgeon: Teena Irani, MD;  Location: [facility];  Service: Endoscopy;  Laterality: N/A;    Family History  Problem Relation Age of Onset  . Atrial fibrillation Mother   . Arrhythmia Mother   . Diabetes Mother   . Hodgkin's lymphoma Father     Allergies  Allergen Reactions  . Jardiance [Empagliflozin]     Causes heart palpitations     BP 138/76 mmHg  Pulse 86  Temp(Src) 97.6 F (36.4 C) (Oral)  Resp 16  Ht 5' 4.5" (1.638 m)  SpO2 98%       Objective:   Physical Exam  Constitutional: She is oriented to person, place, and time. She appears well-developed. She appears distressed.  Cardiovascular: Normal rate, regular rhythm and intact distal pulses.  Exam reveals no gallop and no friction rub.   No murmur heard. Pulmonary/Chest: No respiratory distress. She has wheezes. She has no rales. She exhibits no tenderness.  Expiratory in full fields  Neurological: She is alert and oriented to person, place, and time.  Skin: Skin is warm and dry.          Assessment & Plan:

## 2014-06-01 NOTE — Telephone Encounter (Signed)
Please contact pt and bring her back in today to see Tommi Rumps, my schedule is full.

## 2014-06-01 NOTE — Telephone Encounter (Signed)
Spoke with Christy Abbott on the phone after her CT and [place]. I informed her that both of her tests were negative for blood clots. Advised her to start her Lasix when she got home tonight and I would follow up with her on her blood work tomorrow. She voiced understanding when it was explained to her that she needed to go to the ER if her symptoms worsened or she had any concern

## 2014-06-01 NOTE — Telephone Encounter (Signed)
Notified pt of need for further work up in the office and scheduled appt with, NP Cory at 11am. In regard to lab note, pt denies fever or problems with urination.

## 2014-06-01 NOTE — Progress Notes (Signed)
Pre visit review using our clinic review tool, if applicable. No additional management support is needed unless otherwise documented below in the visit note. 

## 2014-06-01 NOTE — Telephone Encounter (Signed)
Received call from lab re: critical D-dimer result of 1.36. Please advise.

## 2014-06-01 NOTE — Patient Instructions (Signed)
As you leave, please stop by the lab and get your blood drawn. After you are done in the lab, please go down stairs and get your chest x ray. I have sent a prescription to the pharmacy for Lasix, please take this medication when you get it and then as scheduled. Stop the HCTZ that we started you on. The lab test called the D-Dimer which will show me if you have a blood clot will take 7 hours to come back. I will call you tonight with the results. Also, take the Ativan every 6 hours as need for anxiety.

## 2014-06-01 NOTE — Telephone Encounter (Signed)
I called the patient and advised her that her D-Dimer was positive. I explained that this test was not definitive of a blood clot but that she should come back and have an US of the lower extremities and a CT angio of the chest to rule out any type of blood clot in her legs or lungs. She agreed to come back and have these exams done. Will notify patient when results are done.

## 2014-06-02 ENCOUNTER — Telehealth: Payer: Self-pay | Admitting: Adult Health

## 2014-06-02 ENCOUNTER — Ambulatory Visit: Payer: Self-pay | Admitting: Family

## 2014-06-02 LAB — BRAIN NATRIURETIC PEPTIDE: Brain Natriuretic Peptide: 79.4 pg/mL (ref 0.0–100.0)

## 2014-06-02 NOTE — Assessment & Plan Note (Signed)
Worsening shortness of breath since being seen here last week. Ordered chest x ray and CBC, BMP, D-DIMER, BNP. Will wait for labs and imaging to come back and then notify patient. Wrote for 0.5 mg ativan Q6H PRN as I believe there is an anxiety component of the shortness of breath. Advised patient to go to the ER if she feels as though her shortness of breath is becoming worse.

## 2014-06-02 NOTE — Telephone Encounter (Signed)
Notified pt and she voices understanding. Scheduled pt f/u for next week for 06/10/14 at 2:30pm with PA, Hassell Done.

## 2014-06-02 NOTE — Assessment & Plan Note (Signed)
Noticeable bilateral lower extremity swelling ( non pitting at this time). Now up to her knees. Advised patient to that ultrasound and chest angio CT would be the best tests do have at this time. Patient is uninsured and would like to do other tests before the CT and [place]. Ordered chest x ray, D-dimer, BMP, CBC, BNP to help rule out DVT/PE.   Wrote for 20 mg Lasix daily and told to discontinue HCTZ.   Go to the ER with any worsening symptoms.

## 2014-06-02 NOTE — Telephone Encounter (Signed)
Spoke with pt she isn't seeing a lot of improvement. Continues to have lower extremity swelling that is worse as the day goes. Notes that she is elevating feet as much as possible. No noticeable improvement in her shortness of breath. Please advise.

## 2014-06-02 NOTE — Telephone Encounter (Signed)
Attempted to reach pt. Unable to leave message at home or cell#s.

## 2014-06-02 NOTE — Assessment & Plan Note (Signed)
Elevated white count after being discharged from the hospital 11.5 at this time. Will repeat CBC today and see if there is any change. Will advised patient further once lab results are back.

## 2014-06-02 NOTE — Telephone Encounter (Signed)
Please call Christy Abbott and let her know that the rest of her labs came back and they were all improving from her recent hospital stay.  See if she is feeling any better and if the Lasix have improved her swelling and SOB. Melissa would like to see her next week.    Thanks

## 2014-06-02 NOTE — Telephone Encounter (Signed)
Continue the lasix for a couple of more days. If not showing improvement on Thursday we can get her in.   -Elevate legs above the heart at night -Be mindful of how much liquid you are drinking. Cut back if necessary.

## 2014-06-06 ENCOUNTER — Other Ambulatory Visit: Payer: Self-pay | Admitting: Family

## 2014-06-06 ENCOUNTER — Other Ambulatory Visit: Payer: Self-pay | Admitting: Internal Medicine

## 2014-06-09 ENCOUNTER — Other Ambulatory Visit: Payer: Self-pay | Admitting: Internal Medicine

## 2014-06-09 NOTE — Telephone Encounter (Signed)
Approved      Disp Refills Start End    carvedilol (COREG) 12.5 MG tablet 180 tablet 0 06/08/2014     Sig:  TAKE ONE TABLET BY MOUTH TWICE DAILY    Class:  Normal    DAW:  No    Authorizing Provider:  Jettie Booze, MD    Ordering User:  Audria Nine

## 2014-06-10 ENCOUNTER — Ambulatory Visit: Payer: Self-pay | Admitting: Physician Assistant

## 2014-06-10 ENCOUNTER — Telehealth: Payer: Self-pay | Admitting: Family

## 2014-06-10 NOTE — Telephone Encounter (Signed)
Pt car broke down [facility] appointment 06/12/14  At 1:30pm.

## 2014-06-10 NOTE — Telephone Encounter (Signed)
Rescheduled for tomorrow by Marj.  Needed to be seen sooner since having worsening leg edema.

## 2014-06-11 ENCOUNTER — Ambulatory Visit: Payer: Self-pay | Admitting: Medical

## 2014-06-12 ENCOUNTER — Ambulatory Visit: Payer: Self-pay | Admitting: Physician Assistant

## 2014-06-17 ENCOUNTER — Telehealth: Payer: Self-pay | Admitting: Family

## 2014-06-17 ENCOUNTER — Encounter: Payer: Self-pay | Admitting: Family

## 2014-06-17 NOTE — Telephone Encounter (Signed)
Pt was no show for office visit on 3/30 - followed by 2 cancelled appointments with Einar Pheasant and Percell Miller. Letter sent- Charge?

## 2014-06-17 NOTE — Telephone Encounter (Signed)
Yes please

## 2014-06-18 DIAGNOSIS — D3A092 Benign carcinoid tumor of the stomach: Secondary | ICD-10-CM

## 2014-06-18 HISTORY — DX: Benign carcinoid tumor of the stomach: D3A.092

## 2014-06-29 ENCOUNTER — Other Ambulatory Visit: Payer: Self-pay | Admitting: Family

## 2014-07-28 DIAGNOSIS — I10 Essential (primary) hypertension: Secondary | ICD-10-CM | POA: Insufficient documentation

## 2014-07-28 DIAGNOSIS — K219 Gastro-esophageal reflux disease without esophagitis: Secondary | ICD-10-CM | POA: Insufficient documentation

## 2014-08-06 ENCOUNTER — Other Ambulatory Visit: Payer: Self-pay | Admitting: Internal Medicine

## 2014-09-06 ENCOUNTER — Other Ambulatory Visit: Payer: Self-pay | Admitting: Internal Medicine

## 2014-09-07 ENCOUNTER — Other Ambulatory Visit: Payer: Self-pay

## 2014-09-07 MED ORDER — FLECAINIDE ACETATE 50 MG PO TABS: 50.0000 mg | ORAL_TABLET | Freq: Two times a day (BID) | ORAL | Status: DC

## 2014-09-12 ENCOUNTER — Other Ambulatory Visit: Payer: Self-pay | Admitting: Interventional Cardiology

## 2014-09-16 ENCOUNTER — Telehealth: Payer: Self-pay | Admitting: *Deleted

## 2014-09-16 NOTE — Telephone Encounter (Signed)
Patient left a voicemail on the refill line stating that she no longer has insurance and the flecainide cost $200. She would like something cheaper. Please advise. Thanks, MI

## 2014-09-17 NOTE — Telephone Encounter (Signed)
Attempted to reach patient several times.  Not able to leave a message on home phone or cell phone.   (when speak with patient - inform her - per Dr. Lovena Le:            Stop Flecainide.  Start Propafenone 225 mg TID.  Need EKG 2-3 weeks after starting new medication.  Also needs to arrange office visit as she was due for yearly follow up in May)

## 2014-09-21 ENCOUNTER — Telehealth: Payer: Self-pay | Admitting: *Deleted

## 2014-09-21 NOTE — Telephone Encounter (Signed)
Received fax from [facility] requesting refill of lisinopril / hctz.  Medication was stopped at pt's hospital discharge in March and is not on her current med list. Pt states BP was running high and she" was told that she could restart lisinopril HCTZ after her last endoscopy and she is needing refill. Pt confirmed that she is still taking amlodipine and carvedilol. Pt states she has another endoscopy coming up on 09/2714.  Please advise. Pt is due for follow up of diabetes. Notified pt and scheduled follow up for 10/06/14 at 9:45am per pt's request due to finances.  Please call her to schedule follow up.

## 2014-09-21 NOTE — Telephone Encounter (Signed)
Spoke with pt. She states she changed care to Dr Geryl Councilman? At [facility]. Had endoscopy in May and doesn't think BP was high at the time. States that she had told the nurse that she was taken off of the medication and the nurse told her "she didn't see why she couldn't restart the medication after the procedure". Pt states she was not taking it at time of pre-assessment on ?07/28/14 but restarted it after the procedure.  Please see care everywhere notes / records and advise?

## 2014-09-21 NOTE — Telephone Encounter (Signed)
I am not able to view the BP reading in care everwhere.  I would not recommend restarting without rechecking her BP.  If she wants to come sooner for nurse visit bp check at our office she can.

## 2014-09-21 NOTE — Telephone Encounter (Signed)
Patient states she just filled 90 day of Flecainide and is going to wait to change medications since she just purchased this. Also advised her to call office to arrange f/u w/ Lovena Le. Patient verbalized understanding and agreeable to plan.

## 2014-09-21 NOTE — Telephone Encounter (Signed)
Can we please request BP reading from her last endoscopy? I would like to see that before restarting. Thanks

## 2014-09-22 NOTE — Telephone Encounter (Signed)
Ok to refill no nurse visit needed. Tks

## 2014-09-22 NOTE — Telephone Encounter (Signed)
BP at 07/28/14 pre-assessment through [facility] was 168/90 per care everywhere portal. Pt reported that she had restarted lisinopril / hctz after that visit and is now requesting refill.  Please advise if pt still needs nurse visit for BP check.

## 2014-09-23 MED ORDER — LISINOPRIL-HYDROCHLOROTHIAZIDE 20-25 MG PO TABS: 1.0000 | ORAL_TABLET | Freq: Every day | ORAL | Status: DC

## 2014-09-23 NOTE — Telephone Encounter (Signed)
Notified pt. We have already scheduled f/u for 10/06/14.

## 2014-09-23 NOTE — Telephone Encounter (Signed)
Rx sent. Pt is due for follow up of diabetes now. Attempted to reach pt and voicemail has not been set up yet.

## 2014-10-06 ENCOUNTER — Ambulatory Visit: Payer: Self-pay | Admitting: Family

## 2014-10-19 ENCOUNTER — Other Ambulatory Visit: Payer: Self-pay | Admitting: Adult Health

## 2014-10-19 NOTE — Telephone Encounter (Signed)
30 day supply sent to pharmacy.  Please call pt to arrange appt soon.  Thanks!

## 2014-10-19 NOTE — Telephone Encounter (Signed)
Needs OV prior to additional refills.  

## 2014-10-20 NOTE — Telephone Encounter (Signed)
Scheduled for 10/30/14

## 2014-10-29 ENCOUNTER — Other Ambulatory Visit: Payer: Self-pay | Admitting: Family

## 2014-10-30 ENCOUNTER — Encounter: Payer: Self-pay | Admitting: Family

## 2014-10-30 ENCOUNTER — Ambulatory Visit (INDEPENDENT_AMBULATORY_CARE_PROVIDER_SITE_OTHER): Payer: Self-pay | Admitting: Family

## 2014-10-30 VITALS — BP 124/78 | HR 80 | Temp 97.8°F | Resp 16 | Ht 64.5 in | Wt 144.8 lb

## 2014-10-30 DIAGNOSIS — E119 Type 2 diabetes mellitus without complications: Secondary | ICD-10-CM

## 2014-10-30 DIAGNOSIS — I10 Essential (primary) hypertension: Secondary | ICD-10-CM

## 2014-10-30 DIAGNOSIS — E1165 Type 2 diabetes mellitus with hyperglycemia: Secondary | ICD-10-CM

## 2014-10-30 DIAGNOSIS — L659 Nonscarring hair loss, unspecified: Secondary | ICD-10-CM

## 2014-10-30 DIAGNOSIS — IMO0002 Reserved for concepts with insufficient information to code with codable children: Secondary | ICD-10-CM

## 2014-10-30 DIAGNOSIS — Z859 Personal history of malignant neoplasm, unspecified: Secondary | ICD-10-CM

## 2014-10-30 LAB — HEMOGLOBIN A1C: HEMOGLOBIN A1C: 6.3 % (ref 4.6–6.5)

## 2014-10-30 LAB — TSH: TSH: 0.04 u[IU]/mL — AB (ref 0.35–4.50)

## 2014-10-30 LAB — BASIC METABOLIC PANEL
BUN: 16 mg/dL (ref 6–23)
CHLORIDE: 102 meq/L (ref 96–112)
CO2: 31 mEq/L (ref 19–32)
CREATININE: 0.62 mg/dL (ref 0.40–1.20)
Calcium: 9.5 mg/dL (ref 8.4–10.5)
GFR: 104.61 mL/min (ref >60.00)
Glucose, Bld: 89 mg/dL (ref 70–99)
POTASSIUM: 3.3 meq/L — AB (ref 3.5–5.1)
SODIUM: 140 meq/L (ref 135–145)

## 2014-10-30 MED ORDER — PANTOPRAZOLE SODIUM 40 MG PO TBEC: 40.0000 mg | DELAYED_RELEASE_TABLET | Freq: Every day | ORAL | Status: DC

## 2014-10-30 MED ORDER — METFORMIN HCL 500 MG PO TABS: ORAL_TABLET | ORAL | Status: DC

## 2014-10-30 MED ORDER — AMLODIPINE BESYLATE 10 MG PO TABS: 5.0000 mg | ORAL_TABLET | Freq: Every day | ORAL | Status: DC

## 2014-10-30 MED ORDER — LISINOPRIL-HYDROCHLOROTHIAZIDE 20-25 MG PO TABS: 1.0000 | ORAL_TABLET | Freq: Every day | ORAL | Status: DC

## 2014-10-30 MED ORDER — GLIMEPIRIDE 4 MG PO TABS: ORAL_TABLET | ORAL | Status: DC

## 2014-10-30 NOTE — Progress Notes (Signed)
Pre visit review using our clinic review tool, if applicable. No additional management support is needed unless otherwise documented below in the visit note. 

## 2014-10-30 NOTE — Progress Notes (Signed)
Subjective:    [name], female    DOB: Feb 27, 1956, 59 y.o.   MRN: [id number]  HPI  Christy Abbott is a 59 yr old female who presents today for follow up.  Patient presents today for follow up of multiple medical problems.  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  She was referred to Dr. Crisoforo Oxford at [facility] reviewed in Care everywhere.  Pt underwent EUS which revealed an 20m nodule on the lesser curve which was resected.  Pathology revealed well differentiated neuroendocrine tumor with neoplasm present at the inked deep margin    Diabetes Type 2  Pt is currently maintained on the following medications for diabetes: metformin amaryl..   Lab Results  Component Value Date   HGBA1C 6.4* 05/20/2014   HGBA1C 6.4 01/26/2014   HGBA1C 9.3* 08/06/2013    Lab Results  Component Value Date   LDLCALC 78 08/06/2013   CREATININE 0.71 06/01/2014    Last diabetic eye exam was: 1.5 years ago Denies hypoglycemia Home glucose readings range:  Sugars 80-130's  Hypertension  Patient is currently maintained on the following medications for blood pressure: coreg, lasix, lisinopril/hctz Patient reports good compliance with blood pressure medications. Patient denies chest pain, shortness of breath or swelling. Last 3 blood pressure readings in our office are as follows: BP Readings from Last 3 Encounters:  10/30/14 124/78  06/01/14 138/76  05/29/14 134/78    She does report concern about hair loss.  Notes that her mother's hair was thin on the top of her head.      Review of Systems    see HPI  Past Medical History  Diagnosis Date  . Hypertension   . Diabetes mellitus without complication   . Chest pain   . Atrial flutter     s/p CTI by Dr TLovena Le12/2014    Social History   Social History  . Marital Status: Married    Spouse Name: N/A  . Number of Children:  N/A  . Years of Education: N/A   Occupational History  . Not on file.   Social History Main Topics  . Smoking status: Current Every Day Smoker -- 1.00 packs/day    Types: Cigarettes  . Smokeless tobacco: Not on file  . Alcohol Use: No  . Drug Use: No  . Sexual Activity: Not on file   Other Topics Concern  . Not on file   Social History Narrative   Married   BClinical cytogeneticist full time   Daughter- 2 [place]  Enjoys playing on computer.      Past Surgical History  Procedure Laterality Date  . Ablation  03-04-2013    CTI by Dr TLovena Lefor atrial flutter  . Tonsillectomy  1972 ?  .Marland KitchenCholecystectomy  1982  . Atrial flutter ablation N/A 03/04/2013    Procedure: ATRIAL FLUTTER ABLATION;  Surgeon: GEvans Lance MD;  [facility];  Service: Cardiovascular;  Laterality: N/A;  . Esophagogastroduodenoscopy N/A 05/20/2014    Procedure: ESOPHAGOGASTRODUODENOSCOPY (EGD);  Surgeon: JTeena Irani MD;  [facility];  Service: Endoscopy;  Laterality: N/A;    Family History  Problem Relation Age of Onset  . Atrial fibrillation Mother   . Arrhythmia Mother   . Diabetes Mother   . Hodgkin's lymphoma Father     Allergies  Allergen Reactions  . Jardiance [Empagliflozin]  Causes heart palpitations    Current Outpatient Prescriptions on File Prior to Visit  Medication Sig Dispense Refill  . Bioflavonoid Products (ESTER C PO) Take 1 tablet by mouth daily.    . Calcium Carbonate-Vitamin D (CALTRATE 600+D PO) Take 1 tablet by mouth 2 (two) times daily.    . carvedilol (COREG) 12.5 MG tablet TAKE ONE TABLET BY MOUTH TWICE DAILY 180 tablet 0  . ferrous gluconate (FERGON) 324 MG tablet Take 1 tablet (324 mg total) by mouth 2 (two) times daily with a meal. 60 tablet 3  . flecainide (TAMBOCOR) 50 MG tablet Take 1 tablet (50 mg total) by mouth 2 (two) times daily. 180 tablet 0  . furosemide (LASIX) 20 MG tablet TAKE ONE TABLET BY MOUTH DAILY 30 tablet 0  .  LORazepam (ATIVAN) 0.5 MG tablet Take 1 tablet (0.5 mg total) by mouth every 6 (six) hours as needed for anxiety (Take as needed for anxiety and shortnes of breath). 20 tablet 0  . magnesium oxide (MAG-OX) 400 (241.3 MG) MG tablet Take 1 tablet (400 mg total) by mouth daily. 30 tablet 1  . Omega-3 Fatty Acids (FISH OIL) 1200 MG CAPS Take 1,200 mg by mouth daily.      No current facility-administered medications on file prior to visit.    BP 124/78 mmHg  Pulse 80  Temp(Src) 97.8 F (36.6 C) (Oral)  Resp 16  Ht 5' 4.5" (1.638 m)  Wt 144 lb 12.8 oz (65.681 kg)  BMI 24.48 kg/m2  SpO2 99%    Objective:   Physical Exam  Constitutional: She is oriented to person, place, and time. She appears well-developed and well-nourished.  Cardiovascular: Normal rate, regular rhythm and normal heart sounds.   No murmur heard. Pulmonary/Chest: Effort normal and breath sounds normal. No respiratory distress. She has no wheezes.  Musculoskeletal: She exhibits no edema.  Neurological: She is alert and oriented to person, place, and time.  Psychiatric: She has a normal mood and affect. Her behavior is normal. Judgment and thought content normal.          Assessment & Plan:  Hair loss- obtain TSH. She had recent CBC which was stable.

## 2014-10-30 NOTE — Patient Instructions (Signed)
Please complete lab work prior to leaving. Follow up in 4 months.  Schedule appointment with eye doctor for routine eye exam.

## 2014-10-31 ENCOUNTER — Telehealth: Payer: Self-pay | Admitting: Family

## 2014-10-31 ENCOUNTER — Encounter: Payer: Self-pay | Admitting: Family

## 2014-10-31 DIAGNOSIS — E0590 Thyrotoxicosis, unspecified without thyrotoxic crisis or storm: Secondary | ICD-10-CM

## 2014-10-31 DIAGNOSIS — Z859 Personal history of malignant neoplasm, unspecified: Secondary | ICD-10-CM | POA: Insufficient documentation

## 2014-10-31 HISTORY — DX: Thyrotoxicosis, unspecified without thyrotoxic crisis or storm: E05.90

## 2014-10-31 NOTE — Assessment & Plan Note (Signed)
Clinically stable on current meds. Continue same. Obtain bmet, A1C.

## 2014-10-31 NOTE — Telephone Encounter (Signed)
Sugar appears well controlled. Thyroid testing shows overactive thyroid.  Please ask lab to add on free t3 and free t4.  I would like patient to meet with endocrinology.  This could be contributing to her hair loss.

## 2014-10-31 NOTE — Assessment & Plan Note (Signed)
BP stable on current meds. Continue same.  

## 2014-10-31 NOTE — Assessment & Plan Note (Signed)
S/p endoscopic resection.  Pt continues to follow with Dr. Crisoforo Oxford at [facility]. Clinically stable.

## 2014-11-02 NOTE — Telephone Encounter (Signed)
Attempted to call patient x 2.  Phone busy.  Will try again later.

## 2014-11-03 NOTE — Telephone Encounter (Signed)
Notified pt and she voices understanding and is agreeable to proceed with referral.  Add on form faxed to lab.

## 2014-11-04 ENCOUNTER — Encounter: Payer: Self-pay | Admitting: Endocrinology

## 2014-11-04 NOTE — Addendum Note (Signed)
Addended by: Kelle Darting A on: 11/04/2014 11:22 AM   Modules accepted: Orders

## 2014-11-04 NOTE — Telephone Encounter (Signed)
Received call from lab stating they are unable to add on additional thyroid tests. Notified pt and scheduled lab appt for 11/06/14 at 2pm and future order entered.

## 2014-11-06 ENCOUNTER — Other Ambulatory Visit: Payer: Self-pay

## 2014-12-08 ENCOUNTER — Other Ambulatory Visit: Payer: Self-pay | Admitting: Family

## 2014-12-30 ENCOUNTER — Other Ambulatory Visit: Payer: Self-pay | Admitting: Family

## 2014-12-30 DIAGNOSIS — E876 Hypokalemia: Secondary | ICD-10-CM

## 2015-01-01 NOTE — Telephone Encounter (Signed)
Rx sent for Kdur.  I would like her to repeat bmet in 2 weeks, dx hypokalemia.

## 2015-01-01 NOTE — Telephone Encounter (Signed)
Called the patient informed kdur sent in to pharmacy. Scheduled lab appointment for 01/22/15 to recheck bmet. Put order in the lab for bmet. The patient did verbalize understanding of all instructions from PCP.

## 2015-01-13 ENCOUNTER — Other Ambulatory Visit: Payer: Self-pay | Admitting: Internal Medicine

## 2015-01-17 ENCOUNTER — Other Ambulatory Visit: Payer: Self-pay | Admitting: Family

## 2015-01-18 NOTE — Telephone Encounter (Signed)
Medication Detail      Disp Refills Start End     metFORMIN (GLUCOPHAGE) 500 MG tablet 120 tablet 1 10/30/2014     Sig: Take two tablets by mouth twice daily.    Notes to Pharmacy: D/C PREVIOUS SCRIPTS FOR THIS MEDICATION    E-Prescribing Status: Receipt confirmed by pharmacy (10/30/2014 1:50 PM EDT)   Pharmacy    [facility] [place], [place] - [address]   LAST OV: 10/30/14 ROV: 4-Mths Refill sent per [facility] refill protocol/SLS

## 2015-01-22 ENCOUNTER — Other Ambulatory Visit (INDEPENDENT_AMBULATORY_CARE_PROVIDER_SITE_OTHER): Payer: Self-pay

## 2015-01-22 DIAGNOSIS — E876 Hypokalemia: Secondary | ICD-10-CM

## 2015-01-22 LAB — BASIC METABOLIC PANEL
BUN: 16 mg/dL (ref 7–25)
CHLORIDE: 104 mmol/L (ref 98–110)
CO2: 32 mmol/L — AB (ref 20–31)
Calcium: 9.5 mg/dL (ref 8.6–10.4)
Creat: 0.82 mg/dL (ref 0.50–1.05)
Glucose, Bld: 173 mg/dL — ABNORMAL HIGH (ref 65–99)
POTASSIUM: 4.1 mmol/L (ref 3.5–5.3)
Sodium: 146 mmol/L (ref 135–146)

## 2015-01-24 ENCOUNTER — Encounter: Payer: Self-pay | Admitting: Family

## 2015-01-26 ENCOUNTER — Other Ambulatory Visit: Payer: Self-pay | Admitting: Internal Medicine

## 2015-02-01 ENCOUNTER — Other Ambulatory Visit: Payer: Self-pay | Admitting: Internal Medicine

## 2015-02-12 ENCOUNTER — Telehealth: Payer: Self-pay | Admitting: Family

## 2015-02-12 NOTE — Telephone Encounter (Signed)
Pt says that she will have the flu vac done at her local [facility]. She says that it is cheaper that way.

## 2015-03-10 ENCOUNTER — Other Ambulatory Visit: Payer: Self-pay | Admitting: Internal Medicine

## 2015-03-21 ENCOUNTER — Other Ambulatory Visit: Payer: Self-pay | Admitting: Internal Medicine

## 2015-03-23 ENCOUNTER — Other Ambulatory Visit: Payer: Self-pay | Admitting: Internal Medicine

## 2015-03-29 ENCOUNTER — Other Ambulatory Visit: Payer: Self-pay | Admitting: Internal Medicine

## 2015-03-29 NOTE — Telephone Encounter (Signed)
Please advise on request. Looks like patient was to switch to propafenone. Also, she is way overdue for an appointment. Thanks, MI

## 2015-03-30 NOTE — Telephone Encounter (Signed)
Ok to fill and ask she make an appointment.

## 2015-04-11 ENCOUNTER — Other Ambulatory Visit: Payer: Self-pay | Admitting: Family

## 2015-04-11 ENCOUNTER — Other Ambulatory Visit: Payer: Self-pay | Admitting: Internal Medicine

## 2015-04-13 NOTE — Telephone Encounter (Signed)
Sent 30 day supply, needs OV- please contact pt to schedule.

## 2015-05-05 ENCOUNTER — Ambulatory Visit (INDEPENDENT_AMBULATORY_CARE_PROVIDER_SITE_OTHER): Payer: Self-pay | Admitting: Internal Medicine

## 2015-05-05 ENCOUNTER — Encounter: Payer: Self-pay | Admitting: Internal Medicine

## 2015-05-05 VITALS — BP 122/70 | HR 88 | Ht 64.5 in | Wt 155.2 lb

## 2015-05-05 DIAGNOSIS — I10 Essential (primary) hypertension: Secondary | ICD-10-CM

## 2015-05-05 NOTE — Patient Instructions (Signed)
Medication Instructions:  Your physician recommends that you continue on your current medications as directed. Please refer to the Current Medication list given to you today.   Labwork: NONE  Testing/Procedures: NONE  Follow-Up: Your physician wants you to follow-up in: [place] will receive a reminder letter in the mail two months in advance. If you don't receive a letter, please call our office to schedule the follow-up appointment.  Any Other Special Instructions Will Be Listed Below (If Applicable).     If you need a refill on your cardiac medications before your next appointment, please call your pharmacy.

## 2015-05-05 NOTE — Progress Notes (Signed)
HPI [name] returns today for followup. She is a pleasant 60 yo woman with a h/o atrial flutter s/p ablation who developed recurrent palpitations and wore a cardiac monitor which demonstrated PAC's and PVC's and possible atrial tachycardia but not atrial fib or flutter. She was symptomatic and underwent initiation of low dose flecainide and has done very well from an arrhythmia perspective with no palpitations. Her blood pressure has improved. She notes that she has been diagnosed with a neuroendocrine tumor and has undergone extraction and has done well. Allergies  Allergen Reactions  . Jardiance [Empagliflozin]     Causes heart palpitations     Current Outpatient Prescriptions  Medication Sig Dispense Refill  . amLODipine (NORVASC) 10 MG tablet Take 0.5 tablets (5 mg total) by mouth daily. Or as directed 90 tablet 1  . Bioflavonoid Products (ESTER C PO) Take 1 tablet by mouth daily.    . Calcium Carbonate-Vitamin D (CALTRATE 600+D PO) Take 1 tablet by mouth 2 (two) times daily.    . carvedilol (COREG) 12.5 MG tablet TAKE ONE TABLET BY MOUTH TWICE DAILY. (PLEASE CALL AND SCHEDULE AN FURTHER REFILLS). 60 tablet 0  . ferrous gluconate (FERGON) 324 MG tablet Take 1 tablet (324 mg total) by mouth 2 (two) times daily with a meal. 60 tablet 3  . flecainide (TAMBOCOR) 50 MG tablet Take 1 tablet (50 mg total) by mouth 2 (two) times daily. 60 tablet 1  . furosemide (LASIX) 20 MG tablet TAKE ONE TABLET BY MOUTH DAILY. 30 tablet 3  . glimepiride (AMARYL) 4 MG tablet TAKE ONE TABLET BY MOUTH ONCE DAILY BEFORE BREAKFAST 90 tablet 1  . KLOR-CON M20 20 MEQ tablet TAKE ONE TABLET BY MOUTH ONCE DAILY. 30 tablet 0  . magnesium oxide (MAG-OX) 400 (241.3 MG) MG tablet Take 1 tablet (400 mg total) by mouth daily. 30 tablet 1  . metFORMIN (GLUCOPHAGE) 500 MG tablet TAKE TWO TABLETS BY MOUTH TWICE DAILY 120 tablet 1  . Omega-3 Fatty Acids (FISH OIL) 1200 MG CAPS Take 1,200 mg by mouth daily.      No  current facility-administered medications for this visit.     Past Medical History  Diagnosis Date  . Hypertension   . Diabetes mellitus without complication ([place])   . Chest pain   . Atrial flutter [facility])     s/p CTI by Dr Christy Abbott 02/2013  . Gastric tumor 3/16    1A gastric neuroendocrine tumor  . Hyperthyroidism 10/31/2014    ROS:   All systems reviewed and negative except as noted in the HPI.   Past Surgical History  Procedure Laterality Date  . Ablation  03-04-2013    CTI by Dr Christy Abbott for atrial flutter  . Tonsillectomy  1972 ?  Marland Kitchen Cholecystectomy  1982  . Atrial flutter ablation N/A 03/04/2013    Procedure: ATRIAL FLUTTER ABLATION;  Surgeon: Christy Lance, MD;  Location: [facility] LAB;  Service: Cardiovascular;  Laterality: N/A;  . Esophagogastroduodenoscopy N/A 05/20/2014    Procedure: ESOPHAGOGASTRODUODENOSCOPY (EGD);  Surgeon: Christy Irani, MD;  Location: [facility];  Service: Endoscopy;  Laterality: N/A;     Family History  Problem Relation Age of Onset  . Atrial fibrillation Mother   . Arrhythmia Mother   . Diabetes Mother   . Hodgkin's lymphoma Father      Social History   Social History  . Marital Status: Married    Spouse Name: N/A  . Number of Children: N/A  .  Years of Education: N/A   Occupational History  . Not on file.   Social History Main Topics  . Smoking status: Current Every Day Smoker -- 1.00 packs/day    Types: Cigarettes  . Smokeless tobacco: Not on file  . Alcohol Use: No  . Drug Use: No  . Sexual Activity: Not on file   Other Topics Concern  . Not on file   Social History Narrative   Married   Clinical cytogeneticist- full time   Daughter- 2 grandchildren, live in [place]   Enjoys playing on computer.       BP 122/70 mmHg  Pulse 88  Ht 5' 4.5" (1.638 m)  Wt 155 lb 3.2 oz (70.398 kg)  BMI 26.24 kg/m2  Physical Exam:  Well appearing middle aged woman, NAD HEENT: Unremarkable Neck:  No JVD, no thyromegally Back:  No CVA  tenderness Lungs:  Clear with no wheezes HEART:  Regular rate rhythm, no murmurs, no rubs, no clicks Abd:  soft, positive bowel sounds, no organomegally, no rebound, no guarding Ext:  2 plus pulses, no edema, no cyanosis, no clubbing Skin:  No rashes no nodules Neuro:  CN II through XII intact, motor grossly intact   ECG - NSR with STT abnormality  Assess/Plan: 1. Palpitations - these are resolved. She will continue her flecainide and undergo watchful waiting. 2. Atrial flutter - no evidence of recurrence. Will follow. 3. HTN - her blood pressure is ok. No change in her meds.  Christy Abbott.D.

## 2015-05-06 ENCOUNTER — Other Ambulatory Visit: Payer: Self-pay | Admitting: Family

## 2015-05-10 ENCOUNTER — Ambulatory Visit (INDEPENDENT_AMBULATORY_CARE_PROVIDER_SITE_OTHER): Payer: Self-pay | Admitting: Family

## 2015-05-10 ENCOUNTER — Encounter: Payer: Self-pay | Admitting: Family

## 2015-05-10 VITALS — BP 138/82 | HR 75 | Temp 97.7°F | Resp 16 | Ht 64.5 in | Wt 153.8 lb

## 2015-05-10 DIAGNOSIS — E1165 Type 2 diabetes mellitus with hyperglycemia: Secondary | ICD-10-CM

## 2015-05-10 DIAGNOSIS — E119 Type 2 diabetes mellitus without complications: Secondary | ICD-10-CM

## 2015-05-10 DIAGNOSIS — E876 Hypokalemia: Secondary | ICD-10-CM

## 2015-05-10 DIAGNOSIS — IMO0001 Reserved for inherently not codable concepts without codable children: Secondary | ICD-10-CM

## 2015-05-10 DIAGNOSIS — I10 Essential (primary) hypertension: Secondary | ICD-10-CM

## 2015-05-10 DIAGNOSIS — E0590 Thyrotoxicosis, unspecified without thyrotoxic crisis or storm: Secondary | ICD-10-CM

## 2015-05-10 DIAGNOSIS — B353 Tinea pedis: Secondary | ICD-10-CM

## 2015-05-10 MED ORDER — TRAZODONE HCL 50 MG PO TABS: 25.0000 mg | ORAL_TABLET | Freq: Every evening | ORAL | Status: DC | PRN

## 2015-05-10 NOTE — Assessment & Plan Note (Signed)
Clinically stable, obtain, a1c, urine microalbumin.

## 2015-05-10 NOTE — Assessment & Plan Note (Signed)
Obtain follow up TSH.

## 2015-05-10 NOTE — Assessment & Plan Note (Signed)
bp is stable on current meds.

## 2015-05-10 NOTE — Assessment & Plan Note (Signed)
Repeat bmet  

## 2015-05-10 NOTE — Progress Notes (Signed)
Subjective:    [name], female    DOB: Mar 11, 1956, 60 y.o.   MRN: [id number]  HPI  Ms. Christy Abbott is a 60 yr old female who presents today for follow up of multiple medical problems.  1) DM2- currently maintained on metformin and amaryl. Reports that her sugars run 80's to 130's.   Lab Results  Component Value Date   HGBA1C 6.3 10/30/2014   HGBA1C 6.4* 05/20/2014   HGBA1C 6.4 01/26/2014   Lab Results  Component Value Date   LDLCALC 78 08/06/2013   CREATININE 0.82 01/22/2015   2) HTN-  maintained on amlodipine, coreg, lasix BP Readings from Last 3 Encounters:  05/10/15 138/82  05/05/15 122/70  10/30/14 124/78   3)Insomnia- she is currently experiencing difficulty staying asleep.  Cares for mother and brother who lives next door.  Her brother is 47 yrs old and has dementia and downs syndrome.  Reports that she falls right to sleep but then wakes up and can't go back to sleep.  Decaf tea at home, rare cup of coffee.  Has tried benadryl, melatonin without improvement.   4) Eczema-  Reports + flare up of eczema on hands.   5) Hx of Malignant endocrine Tumor- has been on PPI and mag following hospitalization but has since run out and wants to know if she should resume these meds.     Review of Systems    see HPI  Past Medical History  Diagnosis Date  . Hypertension   . Diabetes mellitus without complication ([place])   . Chest pain   . Atrial flutter [facility])     s/p CTI by Dr Lovena Le 02/2013  . Gastric tumor 3/16    1A gastric neuroendocrine tumor  . Hyperthyroidism 10/31/2014    Social History   Social History  . Marital Status: Married    Spouse Name: N/A  . Number of Children: N/A  . Years of Education: N/A   Occupational History  . Not on file.   Social History Main Topics  . Smoking status: Current Every Day Smoker -- 1.00 packs/day    Types: Cigarettes  . Smokeless tobacco: Not on file  . Alcohol Use: No  . Drug Use: No  . Sexual Activity:  Not on file   Other Topics Concern  . 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      Past Surgical History  Procedure Laterality Date  . Ablation  03-04-2013    CTI by Dr Lovena Le for atrial flutter  . Tonsillectomy  1972 ?  Marland Kitchen Cholecystectomy  1982  . Atrial flutter ablation N/A 03/04/2013    Procedure: ATRIAL FLUTTER ABLATION;  Surgeon: Evans Lance, MD;  Location: [facility] CATH LAB;  Service: Cardiovascular;  Laterality: N/A;  . Esophagogastroduodenoscopy N/A 05/20/2014    Procedure: ESOPHAGOGASTRODUODENOSCOPY (EGD);  Surgeon: Teena Irani, MD;  Location: [facility];  Service: Endoscopy;  Laterality: N/A;    Family History  Problem Relation Age of Onset  . Atrial fibrillation Mother   . Arrhythmia Mother   . Diabetes Mother   . Hodgkin's lymphoma Father     Allergies  Allergen Reactions  . Jardiance [Empagliflozin]     Causes heart palpitations    Current Outpatient Prescriptions on File Prior to Visit  Medication Sig Dispense Refill  . amLODipine (NORVASC) 10 MG tablet Take  0.5 tablets (5 mg total) by mouth daily. Or as directed 90 tablet 1  . Bioflavonoid Products (ESTER C PO) Take 1 tablet by mouth daily.    . Calcium Carbonate-Vitamin D (CALTRATE 600+D PO) Take 1 tablet by mouth 2 (two) times daily.    . carvedilol (COREG) 12.5 MG tablet TAKE ONE TABLET BY MOUTH TWICE DAILY. (PLEASE CALL AND SCHEDULE AN FURTHER REFILLS). 60 tablet 0  . ferrous gluconate (FERGON) 324 MG tablet Take 1 tablet (324 mg total) by mouth 2 (two) times daily with a meal. 60 tablet 3  . flecainide (TAMBOCOR) 50 MG tablet Take 1 tablet (50 mg total) by mouth 2 (two) times daily. 60 tablet 1  . furosemide (LASIX) 20 MG tablet TAKE ONE TABLET BY MOUTH DAILY. 30 tablet 3  . glimepiride (AMARYL) 4 MG tablet TAKE ONE TABLET BY MOUTH ONCE DAILY BEFORE BREAKFAST 90 tablet 1  . KLOR-CON M20 20 MEQ  tablet TAKE ONE TABLET BY MOUTH ONCE DAILY. 30 tablet 0  . metFORMIN (GLUCOPHAGE) 500 MG tablet TAKE TWO TABLETS BY MOUTH TWICE DAILY 120 tablet 0  . Omega-3 Fatty Acids (FISH OIL) 1200 MG CAPS Take 1,200 mg by mouth daily.      No current facility-administered medications on file prior to visit.    BP 138/82 mmHg  Pulse 75  Temp(Src) 97.7 F (36.5 C) (Oral)  Resp 16  Ht 5' 4.5" (1.638 m)  Wt 153 lb 12.8 oz (69.763 kg)  BMI 26.00 kg/m2  SpO2 98%    Objective:   Physical Exam  Constitutional: She appears well-developed and well-nourished.  Cardiovascular: Normal rate, regular rhythm and normal heart sounds.   No murmur heard. Pulmonary/Chest: Effort normal and breath sounds normal. No respiratory distress. She has no wheezes.  Skin: Skin is warm and dry.  Some dry peeling skin noted on hands Bilateral feet with thick peeling skin.   Psychiatric: She has a normal mood and affect. Her behavior is normal. Judgment and thought content normal.          Assessment & Plan:   Tinea pedis- suspect fungal (hands and feet) > eczema. Advised pt ok to continue steroid cream given by derm but also to add antifungal cream (otc) bid.    Advised pt oK to remain off of PPI in absence of GERD symptoms.  Will check Mag to see if mag ox is necessary.

## 2015-05-10 NOTE — Patient Instructions (Signed)
Please complete lab work prior to leaving. Continue triamcinolone cream on your hands and feet.  Also add lotrimin cream twice daily until healed.   It is OK to remain off of protonix for now. Please let me know if you have any reflux symptoms, black or bloody stools.

## 2015-05-10 NOTE — Progress Notes (Signed)
Pre visit review using our clinic review tool, if applicable. No additional management support is needed unless otherwise documented below in the visit note. 

## 2015-05-11 ENCOUNTER — Telehealth: Payer: Self-pay | Admitting: Family

## 2015-05-11 LAB — LIPID PANEL
CHOL/HDL RATIO: 4
CHOLESTEROL: 192 mg/dL (ref 0–200)
HDL: 50.7 mg/dL (ref >39.00)
LDL Cholesterol: 107 mg/dL — ABNORMAL HIGH (ref 0–99)
NonHDL: 141.69
Triglycerides: 173 mg/dL — ABNORMAL HIGH (ref 0.0–149.0)
VLDL: 34.6 mg/dL (ref 0.0–40.0)

## 2015-05-11 LAB — BASIC METABOLIC PANEL
BUN: 15 mg/dL (ref 6–23)
CO2: 30 mEq/L (ref 19–32)
Calcium: 10.1 mg/dL (ref 8.4–10.5)
Chloride: 100 mEq/L (ref 96–112)
Creatinine, Ser: 0.63 mg/dL (ref 0.40–1.20)
GFR: 102.51 mL/min (ref >60.00)
Glucose, Bld: 78 mg/dL (ref 70–99)
POTASSIUM: 3.7 meq/L (ref 3.5–5.1)
SODIUM: 139 meq/L (ref 135–145)

## 2015-05-11 LAB — MAGNESIUM: MAGNESIUM: 1.7 mg/dL (ref 1.5–2.5)

## 2015-05-11 LAB — MICROALBUMIN / CREATININE URINE RATIO
CREATININE, U: 61.8 mg/dL
Microalb Creat Ratio: 1.1 mg/g (ref 0.0–30.0)

## 2015-05-11 LAB — HEMOGLOBIN A1C: Hgb A1c MFr Bld: 6.6 % — ABNORMAL HIGH (ref 4.6–6.5)

## 2015-05-11 LAB — TSH: TSH: 0.2 u[IU]/mL — AB (ref 0.35–4.50)

## 2015-05-11 MED ORDER — SIMVASTATIN 10 MG PO TABS: 10.0000 mg | ORAL_TABLET | Freq: Every day | ORAL | Status: DC

## 2015-05-11 NOTE — Telephone Encounter (Signed)
Notified pt and she voices understanding. Provided pt with # to endocrinology office and she will contact them.

## 2015-05-11 NOTE — Telephone Encounter (Signed)
Sugar is at goal. Thyroid is overactive. I would like her to see endocrinology- I placed referral last summer but [facility] was unable to get in touch with her. She should contact their office to schedule consult and let me know if she has any trouble getting appointment.  Mag looks good- ok to remain off of mag. Supplement LDL above goal, start simvastatin '10mg'$  once daily.

## 2015-05-17 ENCOUNTER — Other Ambulatory Visit: Payer: Self-pay | Admitting: Family

## 2015-05-17 NOTE — Telephone Encounter (Signed)
Medication filled to pharmacy as requested.   

## 2015-05-18 ENCOUNTER — Other Ambulatory Visit: Payer: Self-pay | Admitting: Internal Medicine

## 2015-05-31 ENCOUNTER — Other Ambulatory Visit: Payer: Self-pay | Admitting: Family

## 2015-06-01 NOTE — Telephone Encounter (Signed)
Medication filled to pharmacy as requested.   

## 2015-06-03 ENCOUNTER — Telehealth: Payer: Self-pay | Admitting: Family

## 2015-06-03 MED ORDER — GLIMEPIRIDE 4 MG PO TABS: ORAL_TABLET | ORAL | Status: DC

## 2015-06-03 NOTE — Telephone Encounter (Signed)
Called patient.  She says that she has been on the medication for years.  When explained that medication was not listed on med list and that patient was noted not taking medication since August.  She says that it could be just plain lisinopril and not lisinopril HCTZ.  Pt was advised to call back in the morning with bottle in hand to ensure accuracy of med refill request.  Pt agreed and said she would call us back.  Awaiting call back.

## 2015-06-03 NOTE — Telephone Encounter (Signed)
Caller name: Self  Can be reached: [phone]  Pharmacy:  [facility] [address], [place] - [address] [phone] (Phone) [phone] (Fax)        Reason for call: Needs refill on Lisinopril/HCTZ and Glimepride

## 2015-06-03 NOTE — Telephone Encounter (Signed)
My records she is not taking since August, please check with pt.

## 2015-06-03 NOTE — Telephone Encounter (Signed)
Glimepride Last filled: 10/30/14 Amt: 90,1 Last OV:  05/10/15 Med filled.   Lisinopril/HCTZ discontinued/not on medication list.  Please advise.  Ok to refill?

## 2015-06-04 MED ORDER — LISINOPRIL-HYDROCHLOROTHIAZIDE 20-25 MG PO TABS: 1.0000 | ORAL_TABLET | Freq: Every day | ORAL | Status: DC

## 2015-06-04 NOTE — Addendum Note (Signed)
Addended by: Kelle Darting A on: 06/04/2015 04:40 PM   Modules accepted: Orders

## 2015-06-04 NOTE — Telephone Encounter (Signed)
Pt called back stating she has been taking lisinopril HCTZ 20/'25mg'$  once a day and doesn't remember being told to stop medication. Looks like it was "stopped at hospital discharge" in 09/2014?Marland Kitchen  Pt states home BP reading last night was 148/92 and she has been out of medication x 1 week. Please advise if ok to send RX?

## 2015-06-04 NOTE — Telephone Encounter (Addendum)
Rx sent, pt notified. 

## 2015-06-04 NOTE — Telephone Encounter (Signed)
Ok to send rx please.

## 2015-06-10 ENCOUNTER — Other Ambulatory Visit: Payer: Self-pay | Admitting: Family

## 2015-06-10 NOTE — Telephone Encounter (Signed)
Medication filled to pharmacy as requested.   

## 2015-06-26 ENCOUNTER — Other Ambulatory Visit: Payer: Self-pay | Admitting: Internal Medicine

## 2015-07-17 IMAGING — US US SOFT TISSUE HEAD/NECK
1 series · 13 of 25 positions shown · non-contrast
Comparison: None available

CLINICAL DATA: Hyperthyroid

EXAM:
THYROID ULTRASOUND
TECHNIQUE: Ultrasound examination of the thyroid gland and adjacent soft
tissues was performed.

[Series 1: us soft tissue head/neck · 0.06mm/px · 13 of 32 slices shown]
[im 1/32]
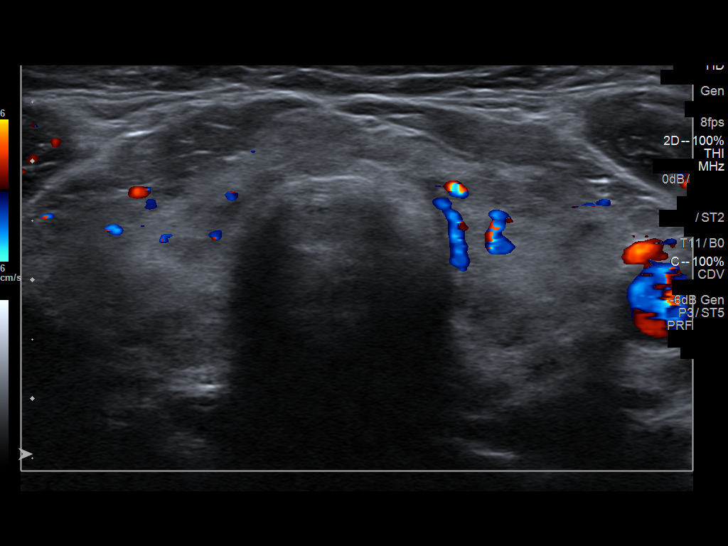
[im 3/32]
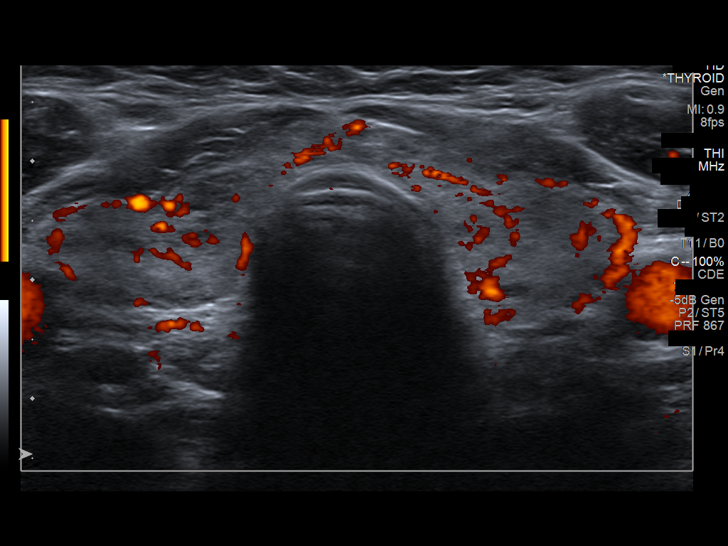
[im 6/32]
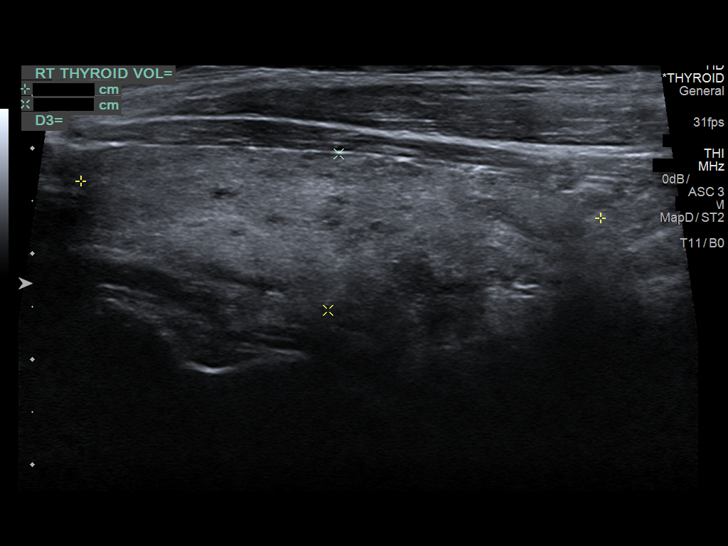
[im 8/32]
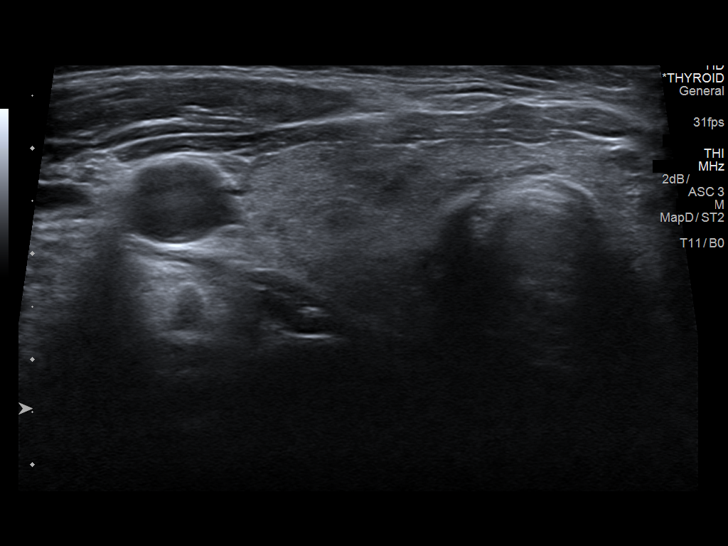
[im 11/32]
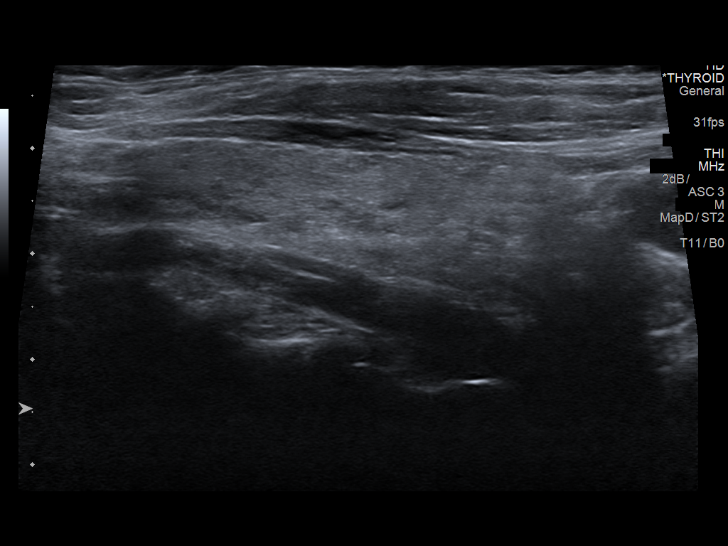
[im 13/32]
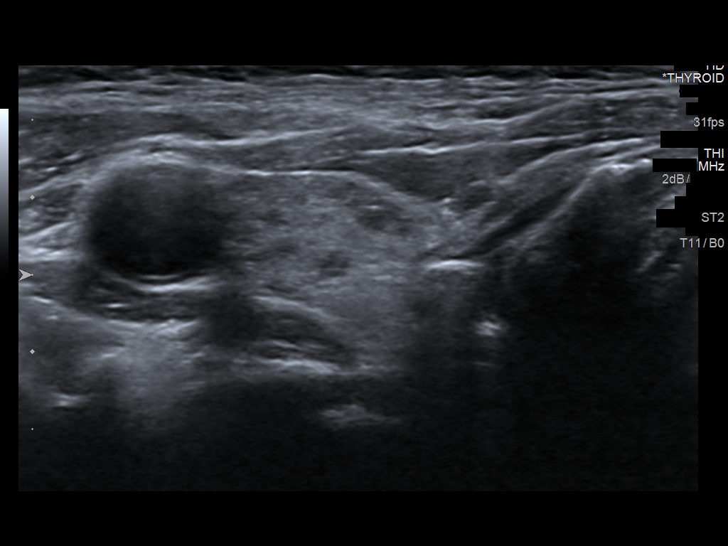
[im 16/32]
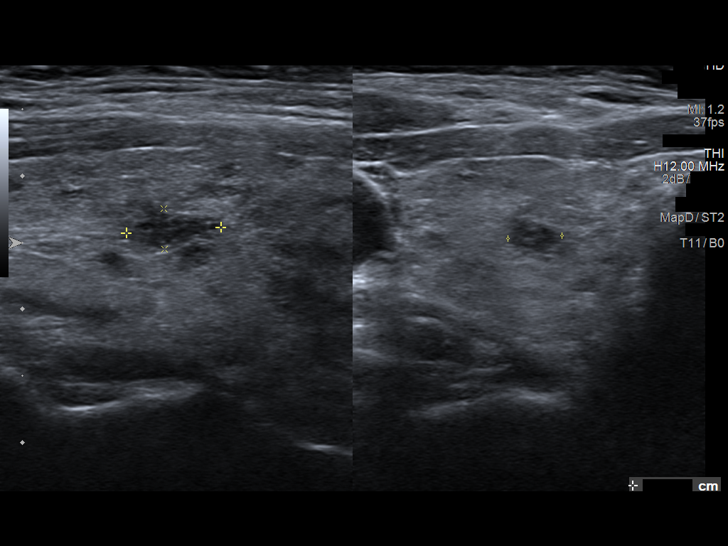
[im 19/32]
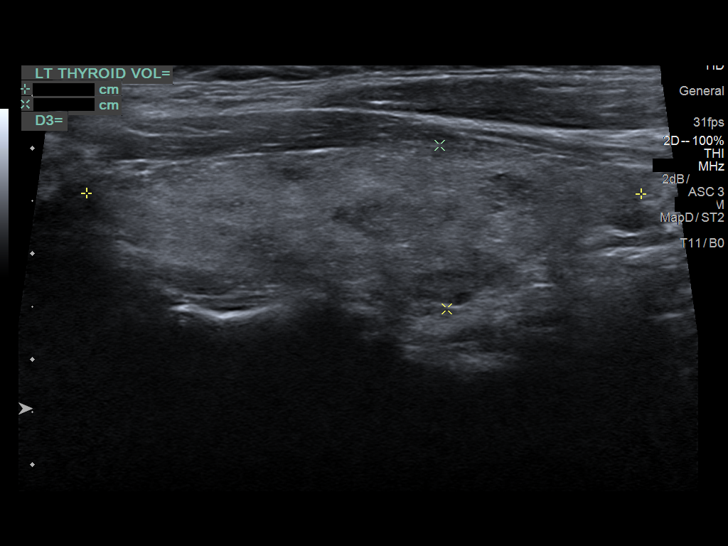
[im 21/32]
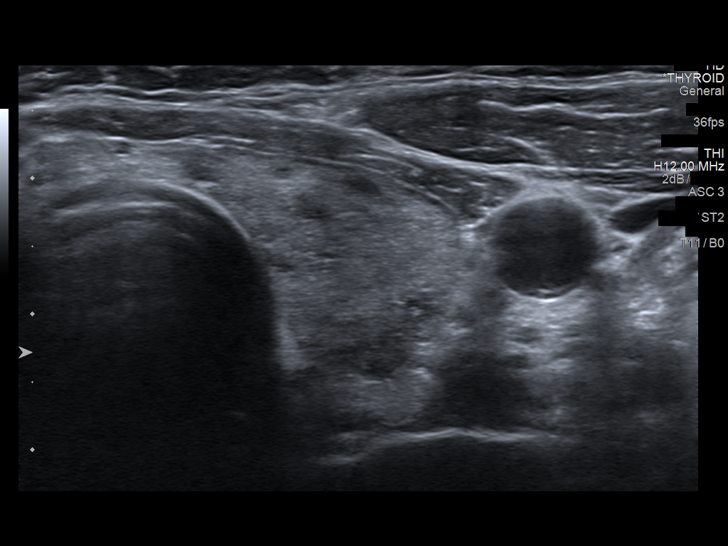
[im 24/32]
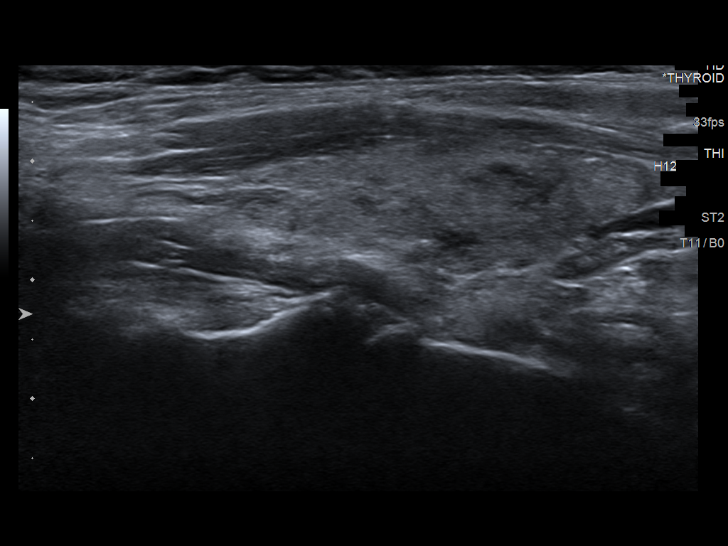
[im 26/32]
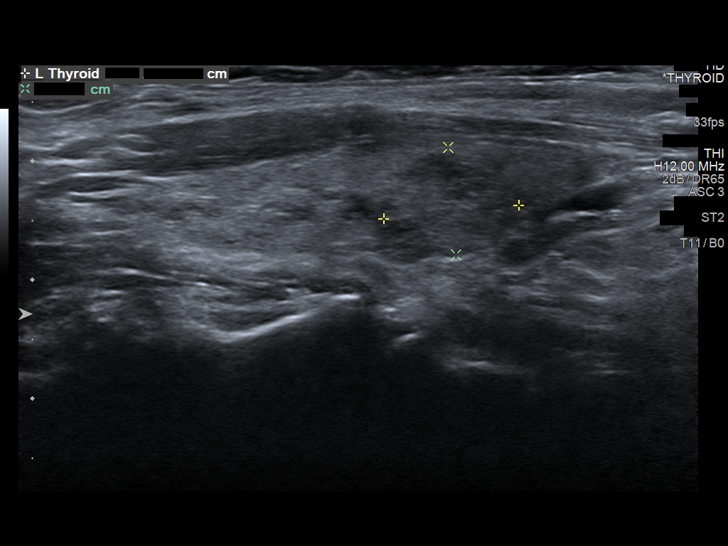
[im 29/32]
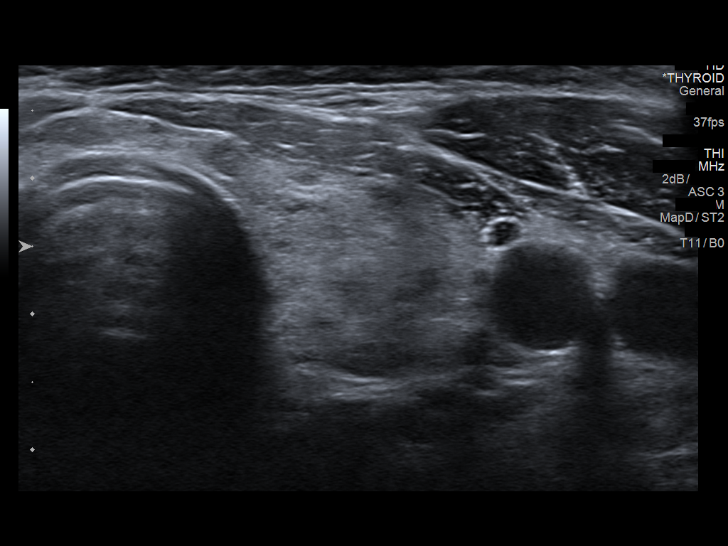
[im 32/32]
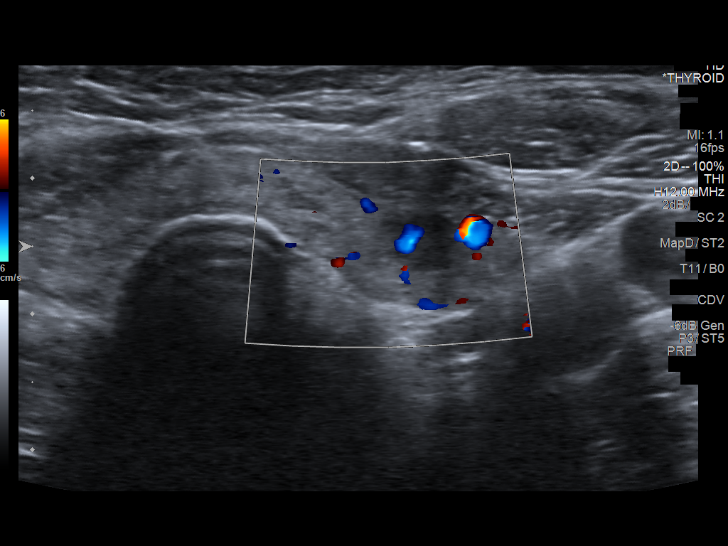

[13 of 25 positions shown; findings below may reference images not displayed]

FINDINGS: Parenchymal Echotexture: Moderately heterogenous

Isthmus: 4 mm

Right lobe: 4.9 x 1.5 x 1.9 cm

Left lobe: 5.3 x 1.6 x 1.5 cm

_________________________________________________________

Estimated total number of nodules >/= 1 cm: 0

Number of spongiform nodules >/=  2 cm not described below (TR1): 0

Number of mixed cystic and solid nodules >/= 1.5 cm not described
below (TR2): 0

Diffusely heterogeneous thyroid echotexture. Scattered numerous tiny
subcentimeter hypoechoic nodules bilaterally.

Nodule # 1:

Location: Left; Mid

Maximum size: 1.1 cm; Other 2 dimensions: 0.9 x 1.0 cm

Composition: solid/almost completely solid (2)

Echogenicity: isoechoic (1)

Shape: not taller-than-wide (0)

Margins: ill-defined (0)

Echogenic foci: none (0)

ACR TI-RADS total points: 3.

ACR TI-RADS risk category: TR3 (3 points).

ACR TI-RADS recommendations:

Given size (<1.4 cm) and appearance, this nodule does NOT meet
TI-RADS criteria for biopsy or dedicated follow-up.
IMPRESSION: 1.1 cm left midpole solid isoechoic nodule (TR 3 nodule). This
nodule does not meet criteria for biopsy or additional follow-up.

The above is in keeping with the ACR TI-RADS recommendations - [HOSPITAL] [63];[DATE].

## 2015-08-05 ENCOUNTER — Other Ambulatory Visit: Payer: Self-pay | Admitting: Family

## 2015-09-09 ENCOUNTER — Telehealth: Payer: Self-pay | Admitting: Family

## 2015-09-09 NOTE — Telephone Encounter (Signed)
30 day supply of Furosemide sent to pharmacy. Pt is due for 4 month f/u with PCP now.  Please call pt and schedule f/u with Melissa. Thanks!

## 2015-09-10 NOTE — Telephone Encounter (Signed)
Called pt, informed of the below. Pt says that she is currently rushing to get somewhere. She will call us back to schedule F/U.

## 2015-09-27 ENCOUNTER — Telehealth: Payer: Self-pay | Admitting: Family

## 2015-09-28 NOTE — Telephone Encounter (Signed)
30 day supply of simvastatin sent to pharmacy. Pt was due for 4 month follow up on 09/07/15 and has no future appts scheduled. Please call pt to schedule follow up with Melissa. Thanks!

## 2015-10-01 NOTE — Telephone Encounter (Signed)
Pt has been scheduled.  °

## 2015-10-18 ENCOUNTER — Encounter: Payer: Self-pay | Admitting: Family

## 2015-10-18 ENCOUNTER — Ambulatory Visit (INDEPENDENT_AMBULATORY_CARE_PROVIDER_SITE_OTHER): Payer: Self-pay | Admitting: Family

## 2015-10-18 VITALS — BP 126/84 | HR 71 | Temp 97.9°F | Resp 16 | Ht 64.5 in | Wt 149.4 lb

## 2015-10-18 DIAGNOSIS — E0590 Thyrotoxicosis, unspecified without thyrotoxic crisis or storm: Secondary | ICD-10-CM

## 2015-10-18 DIAGNOSIS — Z0000 Encounter for general adult medical examination without abnormal findings: Secondary | ICD-10-CM

## 2015-10-18 DIAGNOSIS — I10 Essential (primary) hypertension: Secondary | ICD-10-CM

## 2015-10-18 DIAGNOSIS — E119 Type 2 diabetes mellitus without complications: Secondary | ICD-10-CM

## 2015-10-18 DIAGNOSIS — G4700 Insomnia, unspecified: Secondary | ICD-10-CM | POA: Insufficient documentation

## 2015-10-18 NOTE — Progress Notes (Signed)
Subjective:    [name], female    DOB: [birth year], 60 y.o.   MRN: [id number]  HPI  Christy Abbott is a 60 yr old female who presents today for follow up.  1) HTN- Patient is maintained on zestoretic, carvedilol, amlodipine. She denies CP/SOB or swelling.  BP Readings from Last 3 Encounters:  10/18/15 126/84  05/10/15 138/82  05/05/15 122/70   2) DM2- maintained on amaryl, metformin. Checks occasionally. Has had a few sugars down to 72 when she had missed meals. Usually in the 120 range.  Lab Results  Component Value Date   HGBA1C 6.6 (H) 05/10/2015   HGBA1C 6.3 10/30/2014   HGBA1C 6.4 (H) 05/20/2014   Lab Results  Component Value Date   MICROALBUR <0.7 05/10/2015   LDLCALC 107 (H) 05/10/2015   CREATININE 0.63 05/10/2015   3) Insomnia-  Noted that Trazodone did not help her insomnia. She is caring for her 82 yr old mother who is complete care.  Her brother has Herbalist.  They live next door to her and she and her husband take turns caring for them day and night.   4) Hyperthyroid-Pt never followed through with endocrinology referral.     Review of Systems    see HPI  Past Medical History:  Diagnosis Date  . Atrial flutter [facility])    s/p CTI by Dr Lovena Le 02/2013  . Chest pain   . Diabetes mellitus without complication ([place])   . 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 Hypertension   . Hyperthyroidism 10/31/2014     Social History   Social History  . Marital status: Married    Spouse name: N/A  . Number of children: N/A  . Years of education: N/A   Occupational History  . Not on file.   Social History Main Topics  . Smoking status: Current Every Day Smoker    Packs/day: 1.00    Types: Cigarettes  . Smokeless tobacco: Not on file  . Alcohol use No  . Drug use: No  . Sexual activity: Not on file   Other Topics Concern  . 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      Past Surgical History:  Procedure Laterality Date  . ABLATION  03-04-2013   CTI by Dr Lovena Le for atrial flutter  . ATRIAL FLUTTER ABLATION N/A 03/04/2013   Procedure: ATRIAL FLUTTER ABLATION;  Surgeon: Evans Lance, MD;  Location: [facility] CATH LAB;  Service: Cardiovascular;  Laterality: N/A;  . CHOLECYSTECTOMY  1982  . ESOPHAGOGASTRODUODENOSCOPY N/A 05/20/2014   Procedure: ESOPHAGOGASTRODUODENOSCOPY (EGD);  Surgeon: Teena Irani, MD;  Location: [facility] END[facility];  Service: Endoscopy;  Laterality: N/A;  . TONSILLECTOMY  1972 ?    Family History  Problem Relation Age of Onset  . Atrial fibrillation Mother   . Arrhythmia Mother   . Diabetes Mother   . Hodgkin's lymphoma Father     Allergies  Allergen Reactions  . Jardiance [Empagliflozin]     Causes heart palpitations  . Trazodone And Nefazodone     "hyped me up, could not sleep"    Current Outpatient Prescriptions on File Prior to Visit  Medication Sig Dispense Refill  . amLODipine (NORVASC) 10 MG tablet Take 0.5 tablets (5 mg total) by mouth daily. 90 tablet 0  . Bioflavonoid Products (  ESTER C PO) Take 1 tablet by mouth daily.    . Calcium Carbonate-Vitamin D (CALTRATE 600+D PO) Take 1 tablet by mouth 2 (two) times daily.    . carvedilol (COREG) 12.5 MG tablet Take 1 tablet (12.5 mg total) by mouth 2 (two) times daily. 60 tablet 11  . ferrous gluconate (FERGON) 324 MG tablet Take 1 tablet (324 mg total) by mouth 2 (two) times daily with a meal. 60 tablet 3  . flecainide (TAMBOCOR) 50 MG tablet Take 1 tablet (50 mg total) by mouth 2 (two) times daily. 60 tablet 9  . furosemide (LASIX) 20 MG tablet TAKE ONE TABLET BY MOUTH ONCE DAILY 30 tablet 0  . glimepiride (AMARYL) 4 MG tablet TAKE ONE TABLET BY MOUTH ONCE DAILY BEFORE BREAKFAST 90 tablet 1  . KLOR-CON M20 20 MEQ tablet TAKE ONE TABLET BY MOUTH ONCE DAILY 30 tablet 3  . metFORMIN (GLUCOPHAGE) 500 MG tablet TAKE TWO  TABLETS BY MOUTH TWICE DAILY 120 tablet 5  . Omega-3 Fatty Acids (FISH OIL) 1200 MG CAPS Take 1,200 mg by mouth daily.     . simvastatin (ZOCOR) 10 MG tablet TAKE ONE TABLET BY MOUTH AT BEDTIME 30 tablet 0  . lisinopril-hydrochlorothiazide (PRINZIDE,ZESTORETIC) 20-25 MG tablet Take 1 tablet by mouth daily. 90 tablet 1   No current facility-administered medications on file prior to visit.     BP 126/84   Pulse 71   Temp 97.9 F (36.6 C)   Resp 16   Ht 5' 4.5" (1.638 m)   Wt 149 lb 6.4 oz (67.8 kg)   SpO2 96%   BMI 25.25 kg/m    Objective:   Physical Exam  Constitutional: She is oriented to person, place, and time. She appears well-developed and well-nourished.  HENT:  Head: Normocephalic and atraumatic.  Cardiovascular: Normal rate, regular rhythm and normal heart sounds.   No murmur heard. Pulmonary/Chest: Effort normal and breath sounds normal. No respiratory distress. She has no wheezes.  Musculoskeletal: She exhibits no edema.  Neurological: She is alert and oriented to person, place, and time.  Skin: Skin is warm and dry.  Psychiatric: She has a normal mood and affect. Her behavior is normal. Judgment and thought content normal.          Assessment & Plan:  She is self pay but will try to schedule a mammogram.

## 2015-10-18 NOTE — Assessment & Plan Note (Signed)
Obtain follow up TSH.

## 2015-10-18 NOTE — Patient Instructions (Signed)
Please complete lab work prior to leaving.  Please schedule a diabetic eye exam. Contact endocrinology to schedule your appointment.

## 2015-10-18 NOTE — Assessment & Plan Note (Signed)
BP stable on current meds. Continue same,obtain follow up bmet.

## 2015-10-18 NOTE — Assessment & Plan Note (Signed)
Sugar stable on current meds. Continue same.

## 2015-10-18 NOTE — Progress Notes (Signed)
Pre visit review using our clinic review tool, if applicable. No additional management support is needed unless otherwise documented below in the visit note. 

## 2015-10-18 NOTE — Assessment & Plan Note (Signed)
Did not tolerate trazodone. She does not wish to try another med for sleep because of needing to care for mom and brother in the middle of the night.

## 2015-10-19 ENCOUNTER — Encounter: Payer: Self-pay | Admitting: Family

## 2015-10-19 LAB — BASIC METABOLIC PANEL
BUN: 15 mg/dL (ref 6–23)
CALCIUM: 10.2 mg/dL (ref 8.4–10.5)
CO2: 30 mEq/L (ref 19–32)
Chloride: 101 mEq/L (ref 96–112)
Creatinine, Ser: 0.67 mg/dL (ref 0.40–1.20)
GFR: 95.34 mL/min (ref >60.00)
Glucose, Bld: 74 mg/dL (ref 70–99)
POTASSIUM: 3.9 meq/L (ref 3.5–5.1)
SODIUM: 140 meq/L (ref 135–145)

## 2015-10-19 LAB — TSH: TSH: 0.1 u[IU]/mL — AB (ref 0.35–4.50)

## 2015-10-19 LAB — HEMOGLOBIN A1C: Hgb A1c MFr Bld: 6.4 % (ref 4.6–6.5)

## 2015-11-02 ENCOUNTER — Other Ambulatory Visit: Payer: Self-pay | Admitting: Family

## 2015-11-12 ENCOUNTER — Other Ambulatory Visit: Payer: Self-pay | Admitting: Family

## 2015-11-21 ENCOUNTER — Other Ambulatory Visit: Payer: Self-pay | Admitting: Family

## 2016-01-25 ENCOUNTER — Other Ambulatory Visit: Payer: Self-pay | Admitting: Family

## 2016-04-10 ENCOUNTER — Encounter: Payer: Self-pay | Admitting: Family

## 2016-04-10 ENCOUNTER — Ambulatory Visit (INDEPENDENT_AMBULATORY_CARE_PROVIDER_SITE_OTHER): Payer: Self-pay | Admitting: Family

## 2016-04-10 DIAGNOSIS — E039 Hypothyroidism, unspecified: Secondary | ICD-10-CM

## 2016-04-10 DIAGNOSIS — G5602 Carpal tunnel syndrome, left upper limb: Secondary | ICD-10-CM

## 2016-04-10 DIAGNOSIS — I10 Essential (primary) hypertension: Secondary | ICD-10-CM

## 2016-04-10 DIAGNOSIS — E0590 Thyrotoxicosis, unspecified without thyrotoxic crisis or storm: Secondary | ICD-10-CM

## 2016-04-10 DIAGNOSIS — M25512 Pain in left shoulder: Secondary | ICD-10-CM

## 2016-04-10 DIAGNOSIS — F439 Reaction to severe stress, unspecified: Secondary | ICD-10-CM

## 2016-04-10 DIAGNOSIS — IMO0001 Reserved for inherently not codable concepts without codable children: Secondary | ICD-10-CM

## 2016-04-10 DIAGNOSIS — E1165 Type 2 diabetes mellitus with hyperglycemia: Secondary | ICD-10-CM

## 2016-04-10 MED ORDER — MELOXICAM 7.5 MG PO TABS: 7.5000 mg | ORAL_TABLET | Freq: Every day | ORAL | 0 refills | Status: DC
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
## 2016-04-10 NOTE — Patient Instructions (Signed)
Please begin meloxicam once daily for the carpal tunnel. Wear wrist brace at night and as you are able through the day. Call if your depression symptoms worsen or if they fail to improve.

## 2016-04-10 NOTE — Assessment & Plan Note (Signed)
She did score 9 on hr PHQ-9. She does not wish to begin medication for her depression symptoms. "I'll get through this, I will be OK."

## 2016-04-10 NOTE — Progress Notes (Signed)
Pre visit review using our clinic review tool, if applicable. No additional management support is needed unless otherwise documented below in the visit note. 

## 2016-04-10 NOTE — Assessment & Plan Note (Signed)
Advised pt to purchase an otc wrist splint. Add meloxicam.

## 2016-04-11 LAB — BASIC METABOLIC PANEL
BUN: 16 mg/dL (ref 6–23)
CALCIUM: 9.3 mg/dL (ref 8.4–10.5)
CO2: 31 meq/L (ref 19–32)
Chloride: 107 mEq/L (ref 96–112)
Creatinine, Ser: 0.67 mg/dL (ref 0.40–1.20)
GFR: 95.19 mL/min (ref >60.00)
GLUCOSE: 195 mg/dL — AB (ref 70–99)
Potassium: 4.2 mEq/L (ref 3.5–5.1)
SODIUM: 142 meq/L (ref 135–145)

## 2016-04-11 LAB — TSH: TSH: 0.24 u[IU]/mL — ABNORMAL LOW (ref 0.35–4.50)

## 2016-04-11 LAB — HEMOGLOBIN A1C: Hgb A1c MFr Bld: 6.5 % (ref 4.6–6.5)

## 2016-04-13 ENCOUNTER — Telehealth: Payer: Self-pay | Admitting: Family

## 2016-04-13 DIAGNOSIS — E0590 Thyrotoxicosis, unspecified without thyrotoxic crisis or storm: Secondary | ICD-10-CM

## 2016-04-13 NOTE — Telephone Encounter (Signed)
Please let pt know that her thyroid is mildly overactive.  Please ask lab if they can add on free t3 and free t4.  Sugar appears well controlled. Kidney function and electrolytes are normal.

## 2016-04-13 NOTE — Telephone Encounter (Signed)
Called patient unable to leave message on voicemail.   Lab unable to add requested labs.  PC

## 2016-04-14 ENCOUNTER — Other Ambulatory Visit: Payer: Self-pay | Admitting: Family

## 2016-04-14 NOTE — Telephone Encounter (Signed)
Notified pt and she voices understanding. Lab appt scheduled for 04/17/16 at 3:45pm and future orders entered.

## 2016-04-14 NOTE — Telephone Encounter (Signed)
Lets ask her to return to the lab for additional testing please.

## 2016-04-17 ENCOUNTER — Other Ambulatory Visit (INDEPENDENT_AMBULATORY_CARE_PROVIDER_SITE_OTHER): Payer: Self-pay

## 2016-04-17 DIAGNOSIS — E0590 Thyrotoxicosis, unspecified without thyrotoxic crisis or storm: Secondary | ICD-10-CM

## 2016-04-18 ENCOUNTER — Telehealth: Payer: Self-pay | Admitting: Family

## 2016-04-18 DIAGNOSIS — E0590 Thyrotoxicosis, unspecified without thyrotoxic crisis or storm: Secondary | ICD-10-CM

## 2016-04-18 LAB — T4, FREE: Free T4: 1.04 ng/dL (ref 0.60–1.60)

## 2016-04-18 LAB — T3, FREE: T3, Free: 3.8 pg/mL (ref 2.3–4.2)

## 2016-04-18 NOTE — Telephone Encounter (Signed)
Notified pt and she voices understanding. Lab appt scheduled for 05/16/16 at 3pm and future orders entered. Pt transferred to radiology to schedule u/s.

## 2016-04-18 NOTE — Telephone Encounter (Signed)
Lab work shows mild hyperthyroid.  I would like her to complete a neck ultrasound to evaluate her thyroid and repeat tsh, free T3 and free T4 in 1 month please.

## 2016-04-25 ENCOUNTER — Ambulatory Visit (HOSPITAL_BASED_OUTPATIENT_CLINIC_OR_DEPARTMENT_OTHER)
Admission: RE | Admit: 2016-04-25 | Discharge: 2016-04-25 | Disposition: A | Discharge status: Home / Self Care | Payer: Self-pay | Source: Ambulatory Visit | Attending: Family | Admitting: Family

## 2016-04-25 ENCOUNTER — Encounter (HOSPITAL_BASED_OUTPATIENT_CLINIC_OR_DEPARTMENT_OTHER): Payer: Self-pay

## 2016-04-25 DIAGNOSIS — Z1231 Encounter for screening mammogram for malignant neoplasm of breast: Secondary | ICD-10-CM | POA: Insufficient documentation

## 2016-04-25 DIAGNOSIS — E041 Nontoxic single thyroid nodule: Secondary | ICD-10-CM | POA: Insufficient documentation

## 2016-04-25 DIAGNOSIS — E0590 Thyrotoxicosis, unspecified without thyrotoxic crisis or storm: Secondary | ICD-10-CM | POA: Insufficient documentation

## 2016-04-25 DIAGNOSIS — Z0000 Encounter for general adult medical examination without abnormal findings: Secondary | ICD-10-CM

## 2016-04-25 IMAGING — MG DIGITAL SCREENING BILATERAL MAMMOGRAM WITH CAD
5 series · 5 of 5 positions shown · non-contrast
Comparison: None.

CLINICAL DATA: Screening.

EXAM:
DIGITAL SCREENING BILATERAL MAMMOGRAM WITH CAD

[R MLO (1 of 2)]
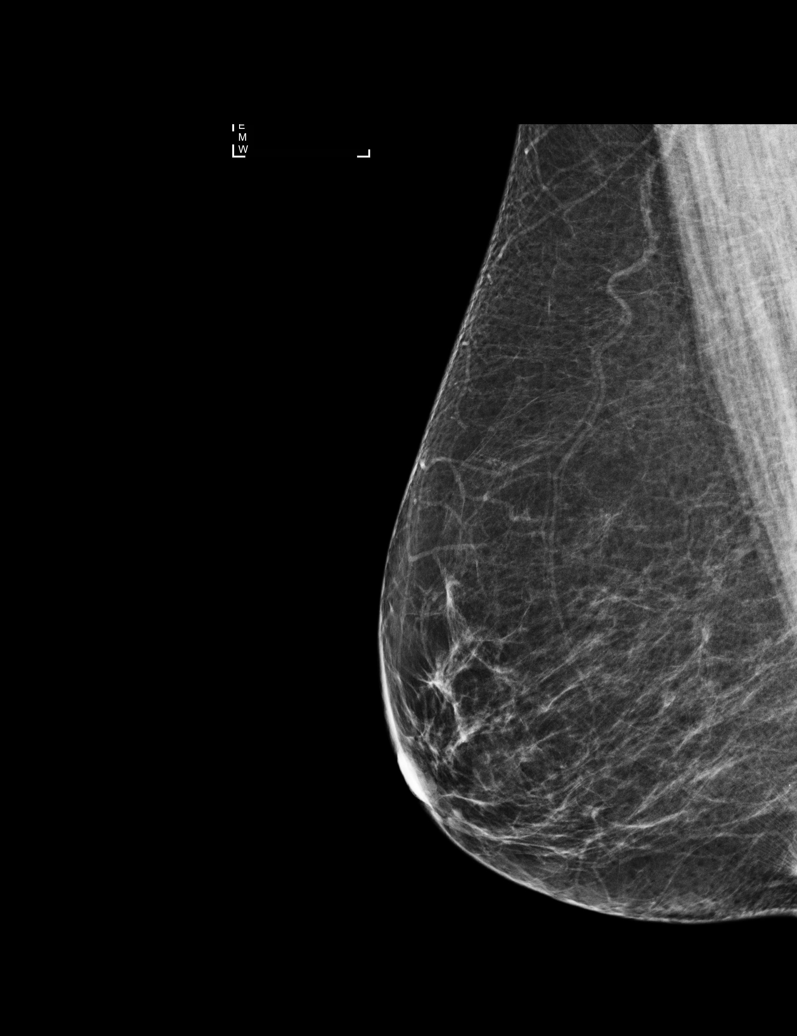

[L MLO]
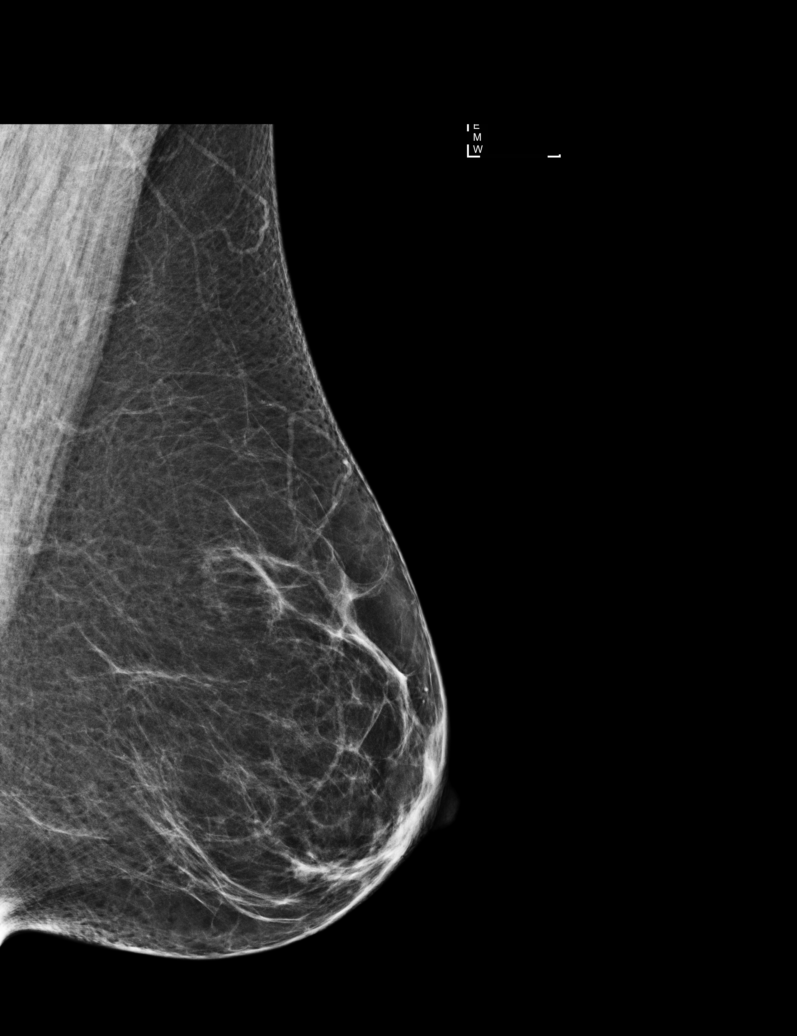

[R MLO (2 of 2)]
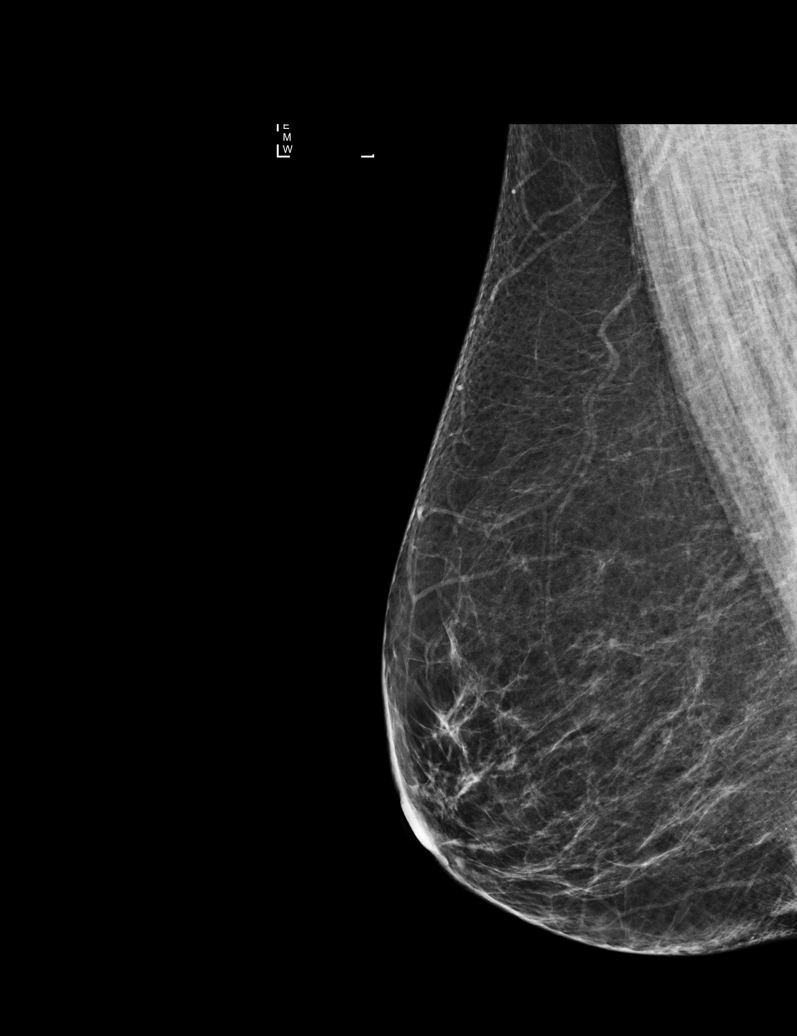

[R CC]
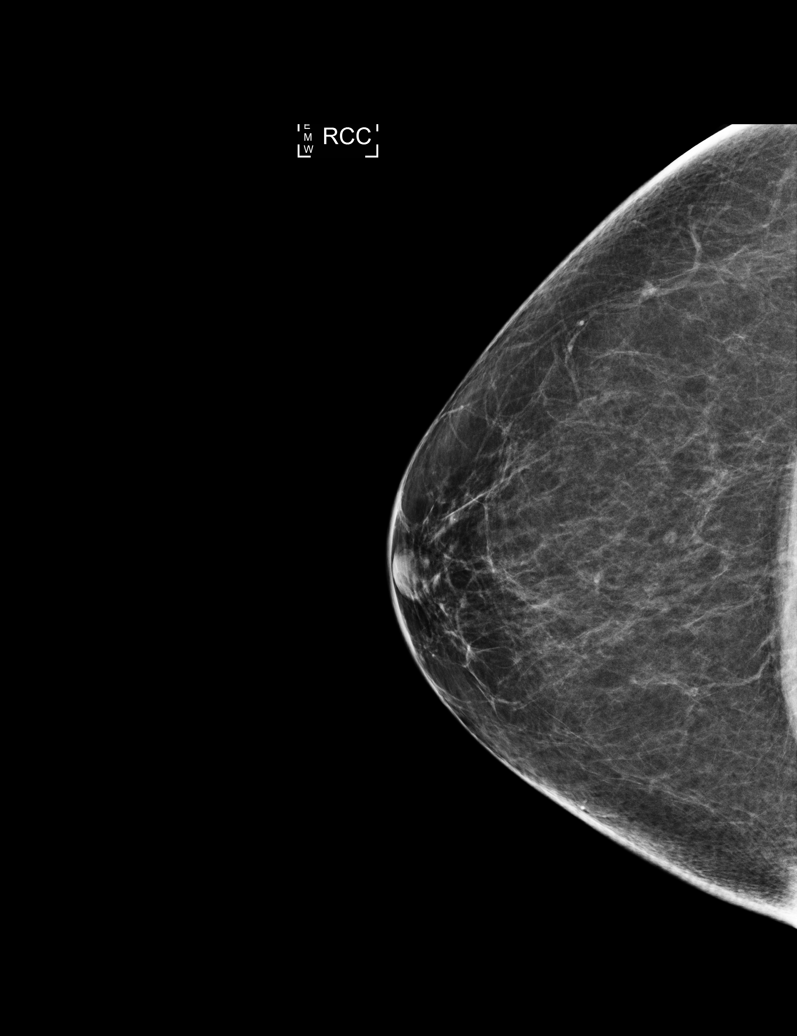

[L CC]
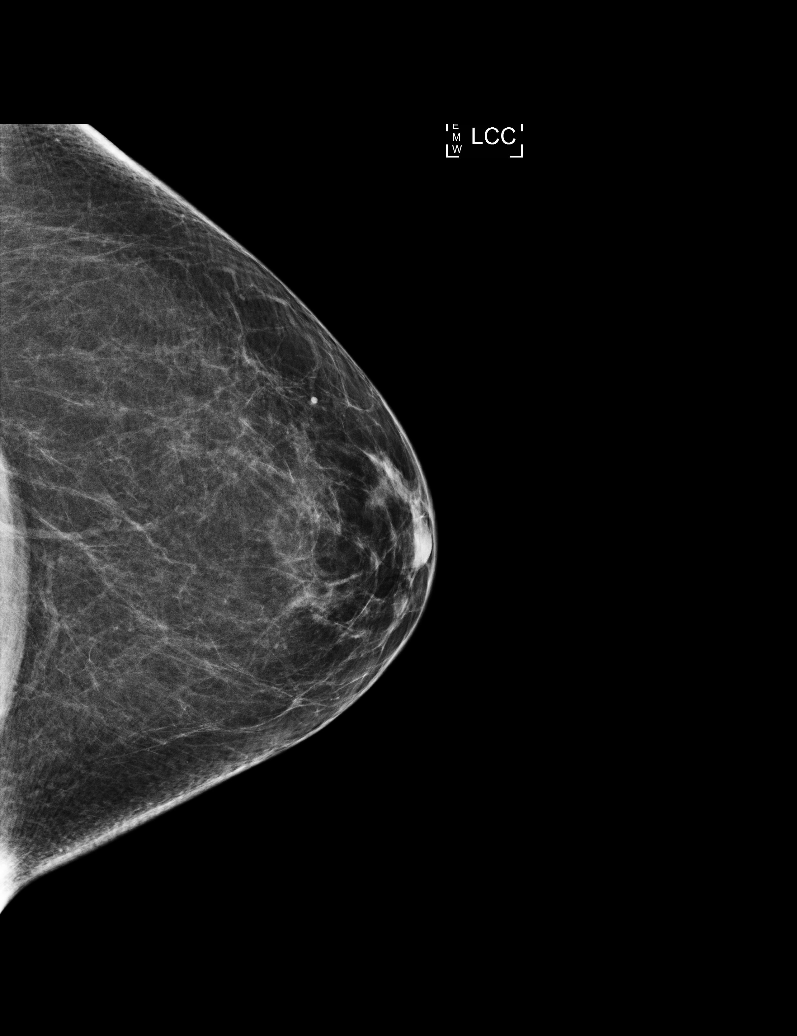

[5 of 5 positions shown; findings below may reference images not displayed]

ACR Breast Density Category b: There are scattered areas of
fibroglandular density.
FINDINGS: There are no findings suspicious for malignancy. Images were
processed with CAD.
IMPRESSION: No mammographic evidence of malignancy. A result letter of this
screening mammogram will be mailed directly to the patient.

RECOMMENDATION:
Screening mammogram in one year. (Code:[GD])

BI-RADS CATEGORY  1: Negative.

## 2016-05-16 ENCOUNTER — Other Ambulatory Visit (INDEPENDENT_AMBULATORY_CARE_PROVIDER_SITE_OTHER): Payer: Self-pay

## 2016-05-16 DIAGNOSIS — E0590 Thyrotoxicosis, unspecified without thyrotoxic crisis or storm: Secondary | ICD-10-CM

## 2016-05-17 LAB — T4, FREE: Free T4: 1.28 ng/dL (ref 0.60–1.60)

## 2016-05-17 LAB — TSH: TSH: 0.05 u[IU]/mL — AB (ref 0.35–4.50)

## 2016-05-17 LAB — T3, FREE: T3 FREE: 3.7 pg/mL (ref 2.3–4.2)

## 2016-05-18 ENCOUNTER — Telehealth: Payer: Self-pay | Admitting: Family

## 2016-05-18 DIAGNOSIS — E0590 Thyrotoxicosis, unspecified without thyrotoxic crisis or storm: Secondary | ICD-10-CM

## 2016-05-18 NOTE — Telephone Encounter (Signed)
Lab work still showing hyperthyroid.  Is she seeing endo?  If not, I would like to get her set up with endocrinology.

## 2016-05-18 NOTE — Telephone Encounter (Signed)
Spoke with pt.about lab results. Pt. Is not seeing and endocrinologist. Pt agreed with referral to endocrinology.Marland Kitchen

## 2016-06-06 ENCOUNTER — Telehealth: Payer: Self-pay | Admitting: Family

## 2016-06-07 NOTE — Telephone Encounter (Signed)
Amlodipine refill sent to pharmacy. Pt last seen 03/2016 and advised f/u 07/09/16 which has not been scheduled. Please call pt to schedule appt. Thanks!

## 2016-06-14 NOTE — Telephone Encounter (Signed)
Called pt. Pt said that she will call back to schedule F/U appt.

## 2016-06-29 ENCOUNTER — Other Ambulatory Visit: Payer: Self-pay | Admitting: Internal Medicine

## 2016-06-29 ENCOUNTER — Ambulatory Visit: Payer: Self-pay | Admitting: Internal Medicine

## 2016-07-04 ENCOUNTER — Other Ambulatory Visit: Payer: Self-pay | Admitting: Family

## 2016-07-04 NOTE — Telephone Encounter (Signed)
Metformin refill sent to pharmacy. Pt is due for follow up with Melissa. Please call pt to schedule appt. Thanks!
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
## 2016-07-05 NOTE — Telephone Encounter (Signed)
Called pt to schedule appt. No answer. Unable to lvm for pt.

## 2016-07-06 ENCOUNTER — Other Ambulatory Visit: Payer: Self-pay | Admitting: Internal Medicine

## 2016-07-19 ENCOUNTER — Other Ambulatory Visit: Payer: Self-pay | Admitting: Internal Medicine

## 2016-07-20 NOTE — Telephone Encounter (Signed)
Medication Detail    Disp Refills Start End   carvedilol (COREG) 12.5 MG tablet 60 tablet 0 07/19/2016    Sig - Route: Take 1 tablet (12.5 mg total) by mouth 2 (two) times daily. *Patient is overdue for an appt, needs to call and schedule for further refills* - Oral   Notes to Pharmacy: Please consider 90 day supplies to promote better adherence   E-Prescribing Status: Receipt confirmed by pharmacy (07/19/2016 12:56 PM EDT)   Pharmacy   [place], [place] - [address]

## 2016-08-12 ENCOUNTER — Other Ambulatory Visit: Payer: Self-pay | Admitting: Family

## 2016-08-14 NOTE — Telephone Encounter (Signed)
2 week supply of furosemide sent to pharmacy. Pt was advised at refill on 06/06/16 that she was due for follow up on 07/09/16 and she declined to schedule at that time stating she would call back. Letter mailed to pt.

## 2016-08-18 ENCOUNTER — Ambulatory Visit: Payer: Self-pay | Admitting: Internal Medicine

## 2016-08-18 DIAGNOSIS — Z0289 Encounter for other administrative examinations: Secondary | ICD-10-CM

## 2016-08-31 ENCOUNTER — Ambulatory Visit: Payer: Self-pay | Admitting: Family

## 2016-09-04 ENCOUNTER — Ambulatory Visit (INDEPENDENT_AMBULATORY_CARE_PROVIDER_SITE_OTHER): Payer: Self-pay | Admitting: Family

## 2016-09-04 ENCOUNTER — Encounter: Payer: Self-pay | Admitting: Family

## 2016-09-04 VITALS — BP 146/82 | HR 80 | Temp 98.2°F | Resp 16 | Ht 65.0 in | Wt 150.8 lb

## 2016-09-04 DIAGNOSIS — E119 Type 2 diabetes mellitus without complications: Secondary | ICD-10-CM

## 2016-09-04 DIAGNOSIS — E785 Hyperlipidemia, unspecified: Secondary | ICD-10-CM

## 2016-09-04 DIAGNOSIS — E0590 Thyrotoxicosis, unspecified without thyrotoxic crisis or storm: Secondary | ICD-10-CM

## 2016-09-04 DIAGNOSIS — G5602 Carpal tunnel syndrome, left upper limb: Secondary | ICD-10-CM

## 2016-09-04 DIAGNOSIS — J40 Bronchitis, not specified as acute or chronic: Secondary | ICD-10-CM

## 2016-09-04 MED ORDER — PREDNISONE 10 MG PO TABS: ORAL_TABLET | ORAL | 0 refills | Status: DC

## 2016-09-04 MED ORDER — FUROSEMIDE 20 MG PO TABS: 20.0000 mg | ORAL_TABLET | Freq: Every day | ORAL | 5 refills | Status: DC

## 2016-09-04 MED ORDER — DOXYCYCLINE HYCLATE 100 MG PO TABS: 100.0000 mg | ORAL_TABLET | Freq: Two times a day (BID) | ORAL | 0 refills | Status: DC

## 2016-09-04 NOTE — Progress Notes (Signed)
Subjective:    [name], female    DOB: Apr 23, 1955, 61 y.o.   MRN: [id number]  HPI  Christy Abbott is a 61 yr old female who presents today for follow up.  1)Hypothryoid-  Lab Results  Component Value Date   TSH 0.05 (L) 05/16/2016   2) Carpal tunnel- completed meloxicam, using wrist splint at night, reports improvement in her symptoms. (let wrist)   3) DM2- maintained on glimepiride and metformin, checks sugars occasionally and when she does they are "within the normal range."   Lab Results  Component Value Date   HGBA1C 6.5 04/10/2016   HGBA1C 6.4 10/18/2015   HGBA1C 6.6 (H) 05/10/2015   Lab Results  Component Value Date   MICROALBUR <0.7 05/10/2015   LDLCALC 107 (H) 05/10/2015   CREATININE 0.67 04/10/2016   4) HTN- reports that she forgot to take her meds this AM.  BP Readings from Last 3 Encounters:  09/04/16 (!) 146/82  04/10/16 (!) 146/80  10/18/15 126/84    5) Hyperlipidemia- Denies myalgia.  Maintained on statin.   Reports recent cold and it has "settled in my chest."    Review of Systems See HPI  Past Medical History:  Diagnosis Date  . Atrial flutter [facility])    s/p CTI by Dr Lovena Le 02/2013  . Chest pain   . Diabetes mellitus without complication ([place])   . 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 Hypertension   . Hyperthyroidism 10/31/2014     Social History   Social History  . Marital status: Married    Spouse name: N/A  . Number of children: N/A  . Years of education: N/A   Occupational History  . Not on file.   Social History Main Topics  . Smoking status: Current Every Day Smoker    Packs/day: 1.00    Types: Cigarettes  . Smokeless tobacco: Never Used  . Alcohol use No  . Drug use: No  . Sexual activity: Not on file   Other Topics Concern  . 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       Past Surgical History:  Procedure Laterality Date  . ABLATION  03-04-2013   CTI by Dr Lovena Le for atrial flutter  . ATRIAL FLUTTER ABLATION N/A 03/04/2013   Procedure: ATRIAL FLUTTER ABLATION;  Surgeon: Evans Lance, MD;  Location: [facility] CATH LAB;  Service: Cardiovascular;  Laterality: N/A;  . CHOLECYSTECTOMY  1982  . ESOPHAGOGASTRODUODENOSCOPY N/A 05/20/2014   Procedure: ESOPHAGOGASTRODUODENOSCOPY (EGD);  Surgeon: Teena Irani, MD;  Location: [facility];  Service: Endoscopy;  Laterality: N/A;  . TONSILLECTOMY  1972 ?    Family History  Problem Relation Age of Onset  . Atrial fibrillation Mother   . Arrhythmia Mother   . Diabetes Mother   . Hodgkin's lymphoma Father     Allergies  Allergen Reactions  . Jardiance [Empagliflozin]     Causes heart palpitations  . Trazodone And Nefazodone     "hyped me up, could not sleep"    Current Outpatient Prescriptions on File Prior to Visit  Medication Sig Dispense Refill  . amLODipine (NORVASC) 10 MG tablet TAKE ONE-HALF TABLET BY MOUTH DAILY 15 tablet 0  . Bioflavonoid Products (ESTER C PO) Take 1 tablet by mouth daily.    . Calcium Carbonate-Vitamin D (CALTRATE 600+D PO)  Take 1 tablet by mouth 2 (two) times daily.    . carvedilol (COREG) 12.5 MG tablet Take 1 tablet (12.5 mg total) by mouth 2 (two) times daily. *Patient is overdue for an appt, needs to call and schedule for further refills* 60 tablet 0  . ferrous gluconate (FERGON) 324 MG tablet Take 1 tablet (324 mg total) by mouth 2 (two) times daily with a meal. 60 tablet 3  . flecainide (TAMBOCOR) 50 MG tablet TAKE ONE TABLET BY MOUTH TWICE DAILY 60 tablet 0  . furosemide (LASIX) 20 MG tablet TAKE ONE TABLET BY MOUTH ONCE DAILY 14 tablet 0  . glimepiride (AMARYL) 4 MG tablet TAKE ONE TABLET BY MOUTH ONCE DAILY BEFORE BREAKFAST 90 tablet 1  . KLOR-CON M20 20 MEQ tablet TAKE ONE TABLET BY MOUTH ONCE DAILY 30 tablet 3  . lisinopril-hydrochlorothiazide (PRINZIDE,ZESTORETIC) 20-25 MG  tablet TAKE ONE TABLET BY MOUTH ONCE DAILY 90 tablet 1  . meloxicam (MOBIC) 7.5 MG tablet Take 1 tablet (7.5 mg total) by mouth daily. 14 tablet 0  . metFORMIN (GLUCOPHAGE) 500 MG tablet TAKE TWO TABLETS BY MOUTH TWICE DAILY 120 tablet 1  . Omega-3 Fatty Acids (FISH OIL) 1200 MG CAPS Take 1,200 mg by mouth daily.     . simvastatin (ZOCOR) 10 MG tablet TAKE ONE TABLET BY MOUTH AT BEDTIME 90 tablet 1   No current facility-administered medications on file prior to visit.     BP (!) 146/82 (BP Location: Right Arm, Cuff Size: Normal)   Pulse 80   Temp 98.2 F (36.8 C) (Oral)   Resp 16   Ht 5\' 5"  (1.651 m)   Wt 150 lb 12.8 oz (68.4 kg)   SpO2 98%   BMI 25.09 kg/m       Objective:   Physical Exam  Constitutional: She is oriented to person, place, and time. She appears well-developed and well-nourished.  HENT:  Head: Normocephalic and atraumatic.  Cardiovascular: Normal rate, regular rhythm and normal heart sounds.   No murmur heard. Pulmonary/Chest: Effort normal. No respiratory distress. She has wheezes.  + bilateral wheezes R>L  Musculoskeletal: She exhibits no edema.  Neurological: She is alert and oriented to person, place, and time.  Skin: Skin is warm and dry.  Psychiatric: She has a normal mood and affect. Her behavior is normal. Judgment and thought content normal.          Assessment & Plan:  Bronchitis- rx with doxy/prednisone, close monitoring of sugars. Pt advised to follow up if new/worsening symptoms or if not resolved in 1 week. She will see if she can borrow a nebulizer machine, if so she will let me know and I will send albuterol neb solution for her. MDI will be too expensive since she is self pay.   DM2- clinically stable, obtain A1C.  Hyperlipidemia- LDL above goal, obtain follow up lipid panel.   Carpal tunnel syndrome- improved, continue wrist brace.   Hyperthyroid- uncontrolled, advised pt to reschedule endo apt.

## 2016-09-04 NOTE — Patient Instructions (Signed)
Please complete lab work prior to leaving. Begin doxycycline (antibiotic) for bronchitis. Begin prednisone taper for wheezing. Check sugars twice daily while taking and call me if sugar >300. Call if cough/wheezing worsens or if not resolved in 1 week.  Schedule follow up with endocrinology. We will arrange an eye exam for you.

## 2016-09-05 ENCOUNTER — Telehealth: Payer: Self-pay | Admitting: Family

## 2016-09-05 LAB — BASIC METABOLIC PANEL
BUN: 18 mg/dL (ref 6–23)
CHLORIDE: 101 meq/L (ref 96–112)
CO2: 30 mEq/L (ref 19–32)
Calcium: 9.5 mg/dL (ref 8.4–10.5)
Creatinine, Ser: 0.74 mg/dL (ref 0.40–1.20)
GFR: 84.76 mL/min (ref >60.00)
GLUCOSE: 123 mg/dL — AB (ref 70–99)
POTASSIUM: 3.5 meq/L (ref 3.5–5.1)
Sodium: 139 mEq/L (ref 135–145)

## 2016-09-05 LAB — LIPID PANEL
CHOLESTEROL: 159 mg/dL (ref 0–200)
HDL: 44.1 mg/dL (ref >39.00)
LDL CALC: 77 mg/dL (ref 0–99)
NonHDL: 115.02
TRIGLYCERIDES: 191 mg/dL — AB (ref 0.0–149.0)
Total CHOL/HDL Ratio: 4
VLDL: 38.2 mg/dL (ref 0.0–40.0)

## 2016-09-05 LAB — HEMOGLOBIN A1C: Hgb A1c MFr Bld: 7.3 % — ABNORMAL HIGH (ref 4.6–6.5)

## 2016-09-05 MED ORDER — PIOGLITAZONE HCL 30 MG PO TABS: 30.0000 mg | ORAL_TABLET | Freq: Every day | ORAL | 5 refills | Status: DC

## 2016-09-05 NOTE — Telephone Encounter (Signed)
Sugar above goal.  Please add actos once daily. She can print a coupon on good rx to decrease cost to about $15.   triglycerides are mildly elevated. Please work on avoiding concentrated sweets, and limiting white carbs (rice/bread/pasta/potatoes). Instead substitute whole grain versions with reasonable portions.

## 2016-09-05 NOTE — Telephone Encounter (Signed)
Notified pt. She voices understanding and is agreeable to proceed with medication / coupon as below.

## 2016-09-22 ENCOUNTER — Other Ambulatory Visit: Payer: Self-pay | Admitting: Internal Medicine

## 2016-10-03 ENCOUNTER — Other Ambulatory Visit: Payer: Self-pay | Admitting: Internal Medicine

## 2016-10-03 MED ORDER — FLECAINIDE ACETATE 50 MG PO TABS
50.0000 mg | ORAL_TABLET | Freq: Two times a day (BID) | ORAL | 1 refills | Status: DC
Start: 2016-10-03 — End: 2016-11-28

## 2016-10-16 ENCOUNTER — Other Ambulatory Visit: Payer: Self-pay | Admitting: Family

## 2016-10-25 ENCOUNTER — Encounter: Payer: Self-pay | Admitting: Internal Medicine

## 2016-10-27 ENCOUNTER — Ambulatory Visit (INDEPENDENT_AMBULATORY_CARE_PROVIDER_SITE_OTHER): Payer: Self-pay | Admitting: Family

## 2016-10-27 VITALS — Ht 65.0 in

## 2016-10-27 NOTE — Progress Notes (Signed)
Appointment scheduled for wrong pt.

## 2016-11-16 ENCOUNTER — Ambulatory Visit: Payer: Self-pay | Admitting: Internal Medicine

## 2016-11-28 ENCOUNTER — Encounter: Payer: Self-pay | Admitting: Internal Medicine

## 2016-11-28 ENCOUNTER — Ambulatory Visit (INDEPENDENT_AMBULATORY_CARE_PROVIDER_SITE_OTHER): Payer: Self-pay | Admitting: Internal Medicine

## 2016-11-28 ENCOUNTER — Encounter (INDEPENDENT_AMBULATORY_CARE_PROVIDER_SITE_OTHER): Payer: Self-pay

## 2016-11-28 VITALS — BP 136/80 | HR 77 | Ht 65.0 in | Wt 152.8 lb

## 2016-11-28 DIAGNOSIS — R002 Palpitations: Secondary | ICD-10-CM

## 2016-11-28 MED ORDER — FLECAINIDE ACETATE 50 MG PO TABS: 50.0000 mg | ORAL_TABLET | Freq: Two times a day (BID) | ORAL | 3 refills | Status: DC
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
## 2016-11-28 NOTE — Patient Instructions (Signed)
Medication Instructions:  Your physician has recommended you make the following change in your medication:  1.  You may take an additional flecainide 50 mg one tablet by mouth as needed.   Labwork: None ordered.   Testing/Procedures: None ordered.   Follow-Up: Your physician wants you to follow-up in: 6 months with Dr. Lovena Le.   You will receive a reminder letter in the mail two months in advance. If you don't receive a letter, please call our office to schedule the follow-up appointment.   Any Other Special Instructions Will Be Listed Below (If Applicable).     If you need a refill on your cardiac medications before your next appointment, please call your pharmacy.

## 2016-11-29 ENCOUNTER — Other Ambulatory Visit: Payer: Self-pay | Admitting: Family

## 2016-12-06 ENCOUNTER — Other Ambulatory Visit: Payer: Self-pay | Admitting: Family

## 2016-12-13 ENCOUNTER — Telehealth: Payer: Self-pay | Admitting: Family

## 2016-12-15 NOTE — Telephone Encounter (Signed)
lvm for pt to return call to schedule apt

## 2016-12-15 NOTE — Telephone Encounter (Signed)
Simvastatin refill was sent to the pharmacy. Pt is due for follow up with Melissa on 01/04/17 and has not scheduled it yet. Please call pt to schedule appt. Thanks!

## 2016-12-18 NOTE — Telephone Encounter (Signed)
Please try to contact pt again to schedule appt if she has not already done so. Thanks!

## 2016-12-26 NOTE — Telephone Encounter (Signed)
Pt has been scheduled.  °

## 2017-01-12 ENCOUNTER — Ambulatory Visit: Payer: Self-pay | Admitting: Family

## 2017-01-12 DIAGNOSIS — Z0289 Encounter for other administrative examinations: Secondary | ICD-10-CM

## 2017-01-15 ENCOUNTER — Telehealth: Payer: Self-pay | Admitting: Family

## 2017-01-15 MED ORDER — METFORMIN HCL 500 MG PO TABS: 1000.0000 mg | ORAL_TABLET | Freq: Two times a day (BID) | ORAL | 1 refills | Status: DC

## 2017-01-15 NOTE — Telephone Encounter (Signed)
Self.   Refill for Metformin   Pharmacy: [place], [place] - [address]

## 2017-01-19 ENCOUNTER — Ambulatory Visit: Payer: Self-pay | Admitting: Family

## 2017-01-22 ENCOUNTER — Other Ambulatory Visit: Payer: Self-pay | Admitting: Family

## 2017-01-22 ENCOUNTER — Encounter: Payer: Self-pay | Admitting: Family

## 2017-01-22 ENCOUNTER — Ambulatory Visit (INDEPENDENT_AMBULATORY_CARE_PROVIDER_SITE_OTHER): Payer: Self-pay | Admitting: Family

## 2017-01-22 VITALS — BP 123/70 | HR 80 | Temp 98.0°F | Resp 20 | Ht 65.0 in | Wt 153.6 lb

## 2017-01-22 DIAGNOSIS — D508 Other iron deficiency anemias: Secondary | ICD-10-CM

## 2017-01-22 DIAGNOSIS — E119 Type 2 diabetes mellitus without complications: Secondary | ICD-10-CM

## 2017-01-22 DIAGNOSIS — I10 Essential (primary) hypertension: Secondary | ICD-10-CM

## 2017-01-22 DIAGNOSIS — E876 Hypokalemia: Secondary | ICD-10-CM

## 2017-01-22 DIAGNOSIS — E0590 Thyrotoxicosis, unspecified without thyrotoxic crisis or storm: Secondary | ICD-10-CM

## 2017-01-22 LAB — CBC WITH DIFFERENTIAL/PLATELET
BASOS ABS: 0.1 10*3/uL (ref 0.0–0.1)
Basophils Relative: 1.2 % (ref 0.0–3.0)
EOS PCT: 5.1 % — AB (ref 0.0–5.0)
Eosinophils Absolute: 0.6 10*3/uL (ref 0.0–0.7)
HCT: 49.2 % — ABNORMAL HIGH (ref 36.0–46.0)
Hemoglobin: 17 g/dL — ABNORMAL HIGH (ref 12.0–15.0)
LYMPHS ABS: 3.8 10*3/uL (ref 0.7–4.0)
LYMPHS PCT: 33.3 % (ref 12.0–46.0)
MCHC: 34.6 g/dL (ref 30.0–36.0)
MCV: 93 fl (ref 78.0–100.0)
MONOS PCT: 5.1 % (ref 3.0–12.0)
Monocytes Absolute: 0.6 10*3/uL (ref 0.1–1.0)
NEUTROS PCT: 55.3 % (ref 43.0–77.0)
Neutro Abs: 6.3 10*3/uL (ref 1.4–7.7)
Platelets: 257 10*3/uL (ref 150.0–400.0)
RBC: 5.29 Mil/uL — AB (ref 3.87–5.11)
RDW: 12.9 % (ref 11.5–15.5)
WBC: 11.5 10*3/uL — AB (ref 4.0–10.5)

## 2017-01-22 LAB — COMPREHENSIVE METABOLIC PANEL
ALK PHOS: 84 U/L (ref 39–117)
ALT: 15 U/L (ref 0–35)
AST: 17 U/L (ref 0–37)
Albumin: 4.5 g/dL (ref 3.5–5.2)
BUN: 18 mg/dL (ref 6–23)
CALCIUM: 11.8 mg/dL — AB (ref 8.4–10.5)
CO2: 33 meq/L — AB (ref 19–32)
CREATININE: 0.75 mg/dL (ref 0.40–1.20)
Chloride: 99 mEq/L (ref 96–112)
GFR: 83.35 mL/min (ref >60.00)
Glucose, Bld: 123 mg/dL — ABNORMAL HIGH (ref 70–99)
Potassium: 4.3 mEq/L (ref 3.5–5.1)
Sodium: 140 mEq/L (ref 135–145)
TOTAL PROTEIN: 7.7 g/dL (ref 6.0–8.3)
Total Bilirubin: 0.6 mg/dL (ref 0.2–1.2)

## 2017-01-22 LAB — HEMOGLOBIN A1C: Hgb A1c MFr Bld: 6.7 % — ABNORMAL HIGH (ref 4.6–6.5)

## 2017-01-22 MED ORDER — FUROSEMIDE 20 MG PO TABS: 20.0000 mg | ORAL_TABLET | Freq: Every day | ORAL | 1 refills | Status: DC

## 2017-01-22 MED ORDER — GLIMEPIRIDE 4 MG PO TABS: ORAL_TABLET | ORAL | 1 refills | Status: DC

## 2017-01-22 MED ORDER — CARVEDILOL 12.5 MG PO TABS: ORAL_TABLET | ORAL | 5 refills | Status: DC

## 2017-01-22 MED ORDER — METFORMIN HCL 500 MG PO TABS: 1000.0000 mg | ORAL_TABLET | Freq: Two times a day (BID) | ORAL | 1 refills | Status: DC

## 2017-01-22 MED ORDER — LISINOPRIL-HYDROCHLOROTHIAZIDE 20-25 MG PO TABS: 1.0000 | ORAL_TABLET | Freq: Every day | ORAL | 1 refills | Status: DC

## 2017-01-22 MED ORDER — AMLODIPINE BESYLATE 10 MG PO TABS: 10.0000 mg | ORAL_TABLET | Freq: Every day | ORAL | 1 refills | Status: DC

## 2017-01-22 MED ORDER — SIMVASTATIN 10 MG PO TABS
10.0000 mg | ORAL_TABLET | Freq: Every day | ORAL | 1 refills | Status: DC
Start: 2017-01-22 — End: 2017-11-19

## 2017-01-22 MED ORDER — POTASSIUM CHLORIDE CRYS ER 20 MEQ PO TBCR: 20.0000 meq | EXTENDED_RELEASE_TABLET | Freq: Every day | ORAL | 1 refills | Status: DC

## 2017-01-22 NOTE — Assessment & Plan Note (Signed)
BP stable on current meds.   

## 2017-01-22 NOTE — Assessment & Plan Note (Signed)
Clinically stable. Could not afford actos. Did not start. Will check A1C. If still above goal will plan actos and coupon for Goodrx which should bring cost to $12.

## 2017-01-22 NOTE — Progress Notes (Signed)
Subjective:    Patient ID: ILINA XU, female    DOB: Jun 27, 1955, 61 y.o.   MRN: [id number]  HPI   Patient is a 61 yr old female who presents today for follow up. Since her last visit she lost her brother who suffered from dementia and Down's syndrome. Her mother is on hospice. She reports that she is managing all things considered.  Hypothyroid-  Lab Results  Component Value Date   TSH 0.05 (L) 05/16/2016   HTN-  BP Readings from Last 3 Encounters:  01/22/17 123/70  11/28/16 136/80  09/04/16 (!) 146/82   Hyperlipidemia- maintained on simvastatin.   Lab Results  Component Value Date   CHOL 159 09/04/2016   HDL 44.10 09/04/2016   LDLCALC 77 09/04/2016   TRIG 191.0 (H) 09/04/2016   CHOLHDL 4 09/04/2016   DM2-  Lab Results  Component Value Date   HGBA1C 7.3 (H) 09/04/2016   HGBA1C 6.5 04/10/2016   HGBA1C 6.4 10/18/2015   Lab Results  Component Value Date   MICROALBUR <0.7 05/10/2015   LDLCALC 77 09/04/2016   CREATININE 0.74 09/04/2016   Lab Results  Component Value Date   WBC 10.4 06/01/2014   HGB 11.1 (L) 06/01/2014   HCT 33.1 (L) 06/01/2014   MCV 90.2 06/01/2014   PLT 583 (H) 06/01/2014       Review of Systems See HPI  Past Medical History:  Diagnosis Date  . Atrial flutter [facility])    s/p CTI by Dr Lovena Le 02/2013  . Chest pain   . Diabetes mellitus without complication ([place])   . 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 Hypertension   . Hyperthyroidism 10/31/2014     Social History   Socioeconomic History  . Marital status: Married    Spouse name: Not on file  . Number of children: Not on file  . Years of education: Not on file  . Highest education level: Not on file  Social Needs  . Financial resource strain: Not on file  . Food insecurity - worry: Not on file  . Food insecurity - inability: Not on file  . Transportation needs - medical: Not on file  . Transportation needs - non-medical: Not on file  Occupational  History  . Not on file  Tobacco Use  . Smoking status: Current Every Day Smoker    Packs/day: 1.00    Types: Cigarettes  . Smokeless tobacco: Never Used  Substance and Sexual Activity  . Alcohol use: No    Alcohol/week: 0.0 oz  . Drug use: No  . Sexual activity: Not on file  Other Topics Concern  . Not on file  Social History Narrative   Married   Clinical cytogeneticist- full time   Daughter- 2 grandchildren, live in [place]   Enjoys playing on computer.      Past Surgical History:  Procedure Laterality Date  . ABLATION  03-04-2013   CTI by Dr Lovena Le for atrial flutter  . CHOLECYSTECTOMY  1982  . TONSILLECTOMY  1972 ?    Family History  Problem Relation Age of Onset  . Atrial fibrillation Mother   . Arrhythmia Mother   . Diabetes Mother   . Hodgkin's lymphoma Father     Allergies  Allergen Reactions  . Jardiance [Empagliflozin]     Causes heart palpitations  . Trazodone And Nefazodone     "hyped me up, could not sleep"    Current Outpatient Medications on File Prior to Visit  Medication Sig Dispense Refill  . amLODipine (NORVASC) 10 MG tablet Take 1 tablet (10 mg total) by mouth daily. 45 tablet 0  . Bioflavonoid Products (ESTER C PO) Take 1 tablet by mouth daily.    . Calcium Carbonate-Vitamin D (CALTRATE 600+D PO) Take 1 tablet by mouth 2 (two) times daily.    . carvedilol (COREG) 12.5 MG tablet TAKE 1 TABLET BY MOUTH TWICE DAILY (OVERDUE  FOR  AN  APPOINTMENT,  NEED  TO  CALL  AND  SCHEDULE  FOR  FURTHER  REFILLS) 60 tablet 2  . ferrous gluconate (FERGON) 324 MG tablet Take 1 tablet (324 mg total) by mouth 2 (two) times daily with a meal. 60 tablet 3  . flecainide (TAMBOCOR) 50 MG tablet Take 1 tablet (50 mg total) by mouth 2 (two) times daily. May take an additional one tablet by mouth as needed 270 tablet 3  . furosemide (LASIX) 20 MG tablet Take 1 tablet (20 mg total) by mouth daily. 30 tablet 5  . glimepiride (AMARYL) 4 MG tablet TAKE ONE TABLET BY MOUTH ONCE DAILY  BEFORE BREAKFAST 90 tablet 0  . KLOR-CON M20 20 MEQ tablet TAKE ONE TABLET BY MOUTH ONCE DAILY 30 tablet 0  . lisinopril-hydrochlorothiazide (PRINZIDE,ZESTORETIC) 20-25 MG tablet TAKE ONE TABLET BY MOUTH ONCE DAILY 90 tablet 0  . metFORMIN (GLUCOPHAGE) 500 MG tablet Take 2 tablets (1,000 mg total) 2 (two) times daily by mouth. 360 tablet 1  . Omega-3 Fatty Acids (FISH OIL) 1200 MG CAPS Take 1,200 mg by mouth daily.     . simvastatin (ZOCOR) 10 MG tablet TAKE ONE TABLET BY MOUTH AT BEDTIME 90 tablet 0   No current facility-administered medications on file prior to visit.     BP 123/70 (BP Location: Right Arm, Cuff Size: Normal)   Pulse 80   Temp 98 F (36.7 C) (Oral)   Resp 20   Ht 5\' 5"  (1.651 m)   Wt 153 lb 9.6 oz (69.7 kg)   SpO2 96%   BMI 25.56 kg/m       Objective:   Physical Exam  Constitutional: She is oriented to person, place, and time. She appears well-developed and well-nourished.  HENT:  Head: Normocephalic and atraumatic.  Cardiovascular: Normal rate, regular rhythm and normal heart sounds.  No murmur heard. Pulmonary/Chest: Effort normal and breath sounds normal. No respiratory distress. She has no wheezes.  Musculoskeletal: She exhibits no edema.  Neurological: She is alert and oriented to person, place, and time.  Psychiatric: She has a normal mood and affect. Her behavior is normal. Judgment and thought content normal.          Assessment & Plan:

## 2017-01-22 NOTE — Assessment & Plan Note (Signed)
Continues kdur, continue potassium.

## 2017-01-22 NOTE — Assessment & Plan Note (Signed)
Obtain follow up tsh.

## 2017-01-22 NOTE — Patient Instructions (Signed)
Please schedule an appointment with an optometrist for diabetic eye exam. Please get a flu shot at your pharmacy.

## 2017-01-24 ENCOUNTER — Telehealth: Payer: Self-pay | Admitting: Family

## 2017-01-24 DIAGNOSIS — D582 Other hemoglobinopathies: Secondary | ICD-10-CM

## 2017-01-24 NOTE — Telephone Encounter (Signed)
Sugar is improved but blood count is elevated and calcium is high.  I want her to stop iron supplement.  Stop calcium supplement.  Repeat pended labs in 2 weeks.

## 2017-01-25 ENCOUNTER — Telehealth: Payer: Self-pay | Admitting: Family

## 2017-01-25 NOTE — Telephone Encounter (Signed)
Pt was seen on 11/12 and now feels that she may have a UTI. Would like to see if an Rx could be called in.  Please advise CB: [phone]

## 2017-01-26 MED ORDER — NITROFURANTOIN MONOHYD MACRO 100 MG PO CAPS
100.0000 mg | ORAL_CAPSULE | Freq: Two times a day (BID) | ORAL | 0 refills | Status: DC
Start: 2017-01-26 — End: 2018-02-25

## 2017-01-26 NOTE — Telephone Encounter (Signed)
Notified pt and advised her to let us know if symptoms do not improve.

## 2017-01-26 NOTE — Telephone Encounter (Signed)
rx sent for macrobid

## 2017-01-26 NOTE — Telephone Encounter (Signed)
Notified pt and she voices understanding. Lab appt scheduled for 02/09/17 at 3pm and future lab orders have been signed.

## 2017-01-26 NOTE — Telephone Encounter (Signed)
Spoke with pt. Reports that she started having dysuria on Monday evening, developed left lower back and side pain which has now resoved. Still having dysuria and states she cannot afford another copay.  Please advise?

## 2017-01-26 NOTE — Addendum Note (Signed)
Addended by: Kelle Darting A on: 01/26/2017 12:24 PM   Modules accepted: Orders

## 2017-02-09 ENCOUNTER — Other Ambulatory Visit: Payer: Self-pay

## 2017-08-13 ENCOUNTER — Other Ambulatory Visit: Payer: Self-pay | Admitting: Family

## 2017-08-13 NOTE — Telephone Encounter (Signed)
90 Day supply of potassium sent to pharmacy. Pt was due for follow up on with Melissa in March and is now past due. Please contact pt to schedule follow up with Melissa soon. Thanks!

## 2017-09-06 ENCOUNTER — Ambulatory Visit (INDEPENDENT_AMBULATORY_CARE_PROVIDER_SITE_OTHER): Payer: Self-pay | Admitting: Family

## 2017-09-06 ENCOUNTER — Encounter: Payer: Self-pay | Admitting: Family

## 2017-09-06 VITALS — BP 133/72 | HR 80 | Temp 98.1°F | Resp 16 | Ht 65.0 in | Wt 159.2 lb

## 2017-09-06 DIAGNOSIS — M545 Low back pain, unspecified: Secondary | ICD-10-CM

## 2017-09-06 DIAGNOSIS — E119 Type 2 diabetes mellitus without complications: Secondary | ICD-10-CM

## 2017-09-06 LAB — BASIC METABOLIC PANEL
BUN: 14 mg/dL (ref 6–23)
CHLORIDE: 101 meq/L (ref 96–112)
CO2: 31 mEq/L (ref 19–32)
CREATININE: 0.69 mg/dL (ref 0.40–1.20)
Calcium: 9.5 mg/dL (ref 8.4–10.5)
GFR: 91.58 mL/min (ref >60.00)
GLUCOSE: 209 mg/dL — AB (ref 70–99)
POTASSIUM: 3.5 meq/L (ref 3.5–5.1)
Sodium: 138 mEq/L (ref 135–145)

## 2017-09-06 LAB — HEMOGLOBIN A1C: Hgb A1c MFr Bld: 8 % — ABNORMAL HIGH (ref 4.6–6.5)

## 2017-09-06 LAB — POC URINALSYSI DIPSTICK (AUTOMATED)
BILIRUBIN UA: NEGATIVE
GLUCOSE UA: POSITIVE — AB
KETONES UA: NEGATIVE
Leukocytes, UA: NEGATIVE
NITRITE UA: NEGATIVE
PH UA: 6 (ref 5.0–8.0)
Protein, UA: NEGATIVE
Spec Grav, UA: 1.02 (ref 1.010–1.025)
Urobilinogen, UA: 0.2 E.U./dL

## 2017-09-06 MED ORDER — MELOXICAM 7.5 MG PO TABS: 7.5000 mg | ORAL_TABLET | Freq: Every day | ORAL | 0 refills | Status: DC

## 2017-09-06 NOTE — Patient Instructions (Signed)
Please begin meloxicam once daily for back pain.  Call if new/worsening symtoms or if your symptoms are not improved in 1 week.

## 2017-09-06 NOTE — Progress Notes (Signed)
Subjective:    [name], female    DOB: [birth year], 62 y.o.   MRN: [id number]  HPI  Patient is a 62 year old female who pain is radiating to the right flank area.Presents today with chief complaint of low back pain.  She reports that the pain began approximately 4 days ago. Notes that initially pain started in the mid back. Pain is stabbing at times.  Pain is constant but will intensifies at times. Denies dysuria/frequency/blood/fever.      Review of Systems See HPI  Past Medical History:  Diagnosis Date  . Atrial flutter [facility])    s/p CTI by Dr Lovena Le 02/2013  . Chest pain   . Diabetes mellitus without complication ([place])   . 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 Hypertension   . Hyperthyroidism 10/31/2014     Social History   Socioeconomic History  . Marital status: Married    Spouse name: Not on file  . Number of children: Not on file  . Years of education: Not on file  . Highest education level: Not on file  Occupational History  . Not on file  Social Needs  . Financial resource strain: Not on file  . Food insecurity:    Worry: Not on file    Inability: Not on file  . Transportation needs:    Medical: Not on file    Non-medical: Not on file  Tobacco Use  . Smoking status: Current Every Day Smoker    Packs/day: 1.00    Types: Cigarettes  . Smokeless tobacco: Never Used  Substance and Sexual Activity  . Alcohol use: No    Alcohol/week: 0.0 oz  . Drug use: No  . Sexual activity: Not on file  Lifestyle  . Physical activity:    Days per week: Not on file    Minutes per session: Not on file  . Stress: Not on file  Relationships  . Social connections:    Talks on phone: Not on file    Gets together: Not on file    Attends religious service: Not on file    Active member of club or organization: Not on file    Attends meetings of clubs or organizations: Not on file    Relationship status: Not on file  . Intimate  partner violence:    Fear of current or ex partner: Not on file    Emotionally abused: Not on file    Physically abused: Not on file    Forced sexual activity: Not on file  Other Topics Concern  . 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      Past Surgical History:  Procedure Laterality Date  . ABLATION  03-04-2013   CTI by Dr Lovena Le for atrial flutter  . ATRIAL FLUTTER ABLATION N/A 03/04/2013   Procedure: ATRIAL FLUTTER ABLATION;  Surgeon: Evans Lance, MD;  Location: [facility] C[facility] LAB;  Service: Cardiovascular;  Laterality: N/A;  . CHOLECYSTECTOMY  1982  . ESOPHAGOGASTRODUODENOSCOPY N/A 05/20/2014   Procedure: ESOPHAGOGASTRODUODENOSCOPY (EGD);  Surgeon: Teena Irani, MD;  Location: [facility] ENDOSCOPY;  Service: Endoscopy;  Laterality: N/A;  . TONSILLECTOMY  1972 ?    Family History  Problem Relation Age of Onset  . Atrial fibrillation Mother   . Arrhythmia Mother   . Diabetes Mother   . Hodgkin's lymphoma Father  Allergies  Allergen Reactions  . Jardiance [Empagliflozin]     Causes heart palpitations  . Trazodone And Nefazodone     "hyped me up, could not sleep"    Current Outpatient Medications on File Prior to Visit  Medication Sig Dispense Refill  . amLODipine (NORVASC) 10 MG tablet Take 1 tablet (10 mg total) daily by mouth. 90 tablet 1  . Bioflavonoid Products (ESTER C PO) Take 1 tablet by mouth daily.    . carvedilol (COREG) 12.5 MG tablet TAKE 1 TABLET BY MOUTH TWICE DAILY 60 tablet 5  . flecainide (TAMBOCOR) 50 MG tablet Take 1 tablet (50 mg total) by mouth 2 (two) times daily. May take an additional one tablet by mouth as needed 270 tablet 3  . furosemide (LASIX) 20 MG tablet Take 1 tablet (20 mg total) daily by mouth. 90 tablet 1  . glimepiride (AMARYL) 4 MG tablet TAKE ONE TABLET BY MOUTH ONCE DAILY BEFORE BREAKFAST 90 tablet 1  .  lisinopril-hydrochlorothiazide (PRINZIDE,ZESTORETIC) 20-25 MG tablet Take 1 tablet daily by mouth. 90 tablet 1  . metFORMIN (GLUCOPHAGE) 500 MG tablet Take 2 tablets (1,000 mg total) 2 (two) times daily by mouth. 180 tablet 1  . nitrofurantoin, macrocrystal-monohydrate, (MACROBID) 100 MG capsule Take 1 capsule (100 mg total) 2 (two) times daily by mouth. 10 capsule 0  . Omega-3 Fatty Acids (FISH OIL) 1200 MG CAPS Take 1,200 mg by mouth daily.     . potassium chloride SA (K-DUR,KLOR-CON) 20 MEQ tablet TAKE 1 TABLET BY MOUTH ONCE DAILY 90 tablet 0  . simvastatin (ZOCOR) 10 MG tablet Take 1 tablet (10 mg total) at bedtime by mouth. 90 tablet 1   No current facility-administered medications on file prior to visit.     BP 133/72 (BP Location: Right Arm, Patient Position: Sitting, Cuff Size: Small)   Pulse 80   Temp 98.1 F (36.7 C) (Oral)   Resp 16   Ht 5\' 5"  (1.651 m)   Wt 159 lb 3.2 oz (72.2 kg)   SpO2 96%   BMI 26.49 kg/m       Objective:   Physical Exam  Constitutional: She appears well-developed and well-nourished.  Cardiovascular: Normal rate, regular rhythm and normal heart sounds.  No murmur heard. Pulmonary/Chest: Effort normal and breath sounds normal. No respiratory distress. She has no wheezes.  Musculoskeletal: She exhibits no edema.  Psychiatric: She has a normal mood and affect. Her behavior is normal. Judgment and thought content normal.  GU: Neg CVAT       Assessment & Plan:  Diabetes type 2- obtain A1C.   Lab Results  Component Value Date   HGBA1C 6.7 (H) 01/22/2017   HGBA1C 7.3 (H) 09/04/2016   HGBA1C 6.5 04/10/2016   Lab Results  Component Value Date   MICROALBUR <0.7 05/10/2015   LDLCALC 77 09/04/2016   CREATININE 0.75 01/22/2017   Back pain- suspect Musculoskeletal back pain. UA is unremarkable except for glucose.  Trial of meloxicam. If symptoms worsen or fail to improve, consider further imaging.

## 2017-09-08 ENCOUNTER — Telehealth: Payer: Self-pay | Admitting: Family

## 2017-09-08 MED ORDER — PIOGLITAZONE HCL 30 MG PO TABS: 30.0000 mg | ORAL_TABLET | Freq: Every day | ORAL | 5 refills | Status: DC

## 2017-09-08 NOTE — Telephone Encounter (Signed)
Sugar is uncontrolled. Add Actos once daily. Continue to work on diet.

## 2017-09-11 NOTE — Telephone Encounter (Signed)
Pt. Given results and instructions. Verbalizes understanding.

## 2017-09-11 NOTE — Telephone Encounter (Signed)
Lm for patient to call back, Hemoglobin A1c is 8.01 and a new rx was sent to her pharmacy. Kulm for triage nurse to give this information to patient if she calls back.

## 2017-09-14 ENCOUNTER — Other Ambulatory Visit: Payer: Self-pay | Admitting: Family

## 2017-10-26 ENCOUNTER — Telehealth: Payer: Self-pay | Admitting: *Deleted

## 2017-10-26 NOTE — Telephone Encounter (Signed)
Spoke with pt and scheduled lab visit for Monday at 1:30pm. See future orders for PTH w/calcium and CBC w/diff.

## 2017-10-26 NOTE — Telephone Encounter (Signed)
-----   Message from [name], NP sent at 07/31/2017  7:37 AM EDT ----- Can you please schedule a lab visit for her to complete blood work from November?  ----- Message ----- From: SYSTEM Sent: 07/31/2017  12:06 AM To: [name], NP

## 2017-10-27 ENCOUNTER — Other Ambulatory Visit: Payer: Self-pay | Admitting: Family

## 2017-10-29 ENCOUNTER — Other Ambulatory Visit: Payer: Self-pay | Admitting: Emergency Medicine

## 2017-10-29 ENCOUNTER — Other Ambulatory Visit: Payer: Self-pay

## 2017-10-29 MED ORDER — LISINOPRIL-HYDROCHLOROTHIAZIDE 20-25 MG PO TABS: 1.0000 | ORAL_TABLET | Freq: Every day | ORAL | 0 refills | Status: DC

## 2017-10-29 MED ORDER — GLIMEPIRIDE 4 MG PO TABS: ORAL_TABLET | ORAL | 0 refills | Status: DC

## 2017-10-29 NOTE — Addendum Note (Signed)
Addended by: Kelle Darting A on: 10/29/2017 01:27 PM   Modules accepted: Orders

## 2017-11-16 ENCOUNTER — Other Ambulatory Visit: Payer: Self-pay

## 2017-11-19 ENCOUNTER — Other Ambulatory Visit: Payer: Self-pay | Admitting: Family

## 2017-11-19 ENCOUNTER — Other Ambulatory Visit (INDEPENDENT_AMBULATORY_CARE_PROVIDER_SITE_OTHER): Payer: Self-pay

## 2017-11-19 DIAGNOSIS — D582 Other hemoglobinopathies: Secondary | ICD-10-CM

## 2017-11-20 LAB — CBC WITH DIFFERENTIAL/PLATELET
BASOS ABS: 0.1 10*3/uL (ref 0.0–0.1)
Basophils Relative: 0.8 % (ref 0.0–3.0)
EOS PCT: 4.1 % (ref 0.0–5.0)
Eosinophils Absolute: 0.4 10*3/uL (ref 0.0–0.7)
HCT: 44.6 % (ref 36.0–46.0)
Hemoglobin: 15.6 g/dL — ABNORMAL HIGH (ref 12.0–15.0)
LYMPHS ABS: 2.8 10*3/uL (ref 0.7–4.0)
Lymphocytes Relative: 29.5 % (ref 12.0–46.0)
MCHC: 35.1 g/dL (ref 30.0–36.0)
MCV: 90 fl (ref 78.0–100.0)
MONOS PCT: 5.1 % (ref 3.0–12.0)
Monocytes Absolute: 0.5 10*3/uL (ref 0.1–1.0)
NEUTROS ABS: 5.8 10*3/uL (ref 1.4–7.7)
NEUTROS PCT: 60.5 % (ref 43.0–77.0)
PLATELETS: 264 10*3/uL (ref 150.0–400.0)
RBC: 4.95 Mil/uL (ref 3.87–5.11)
RDW: 13 % (ref 11.5–15.5)
WBC: 9.5 10*3/uL (ref 4.0–10.5)

## 2017-11-20 LAB — PTH, INTACT AND CALCIUM
Calcium: 9.7 mg/dL (ref 8.6–10.4)
PTH: 32 pg/mL (ref 14–64)

## 2017-11-27 ENCOUNTER — Telehealth: Payer: Self-pay

## 2017-11-27 DIAGNOSIS — D582 Other hemoglobinopathies: Secondary | ICD-10-CM

## 2017-11-27 NOTE — Telephone Encounter (Signed)
Author phoned pt. To see if she could return for ferritn lab draw per [name], PCP. Order placed, lab appointment made for 9/18 at 3PM. Pt. Verbalized understanding.

## 2017-11-27 NOTE — Telephone Encounter (Signed)
-----   Message from Caffie Pinto sent at 11/27/2017 10:05 AM EDT ----- Spoke with Tiffany at [facility] and it is to late to add Ferritin level to blood drawn on 11-19-17. Lab holds blood for just 7 days.///KMP

## 2017-11-28 ENCOUNTER — Other Ambulatory Visit (INDEPENDENT_AMBULATORY_CARE_PROVIDER_SITE_OTHER): Payer: Self-pay

## 2017-11-28 DIAGNOSIS — D582 Other hemoglobinopathies: Secondary | ICD-10-CM

## 2017-11-29 ENCOUNTER — Telehealth: Payer: Self-pay | Admitting: Family

## 2017-11-29 DIAGNOSIS — R7989 Other specified abnormal findings of blood chemistry: Secondary | ICD-10-CM

## 2017-11-29 LAB — FERRITIN: FERRITIN: 301.6 ng/mL — AB (ref 10.0–291.0)

## 2017-11-29 NOTE — Telephone Encounter (Signed)
Please let patient know that lab work shows elevated iron levels. I would like her to return to the lab at her convenience for an additional lab test.

## 2017-11-30 NOTE — Telephone Encounter (Signed)
Notified pt and she is agreeable to return for additional lab. Appt scheduled for 12/03/17 at 4pm. Pt states she had to have her blood drawn off years ago and can't remember if it was because of her iron or not but she will complete lab to further evaluate.

## 2017-12-03 ENCOUNTER — Other Ambulatory Visit (INDEPENDENT_AMBULATORY_CARE_PROVIDER_SITE_OTHER): Payer: Self-pay

## 2017-12-03 DIAGNOSIS — R7989 Other specified abnormal findings of blood chemistry: Secondary | ICD-10-CM

## 2017-12-08 LAB — HEMOCHROMATOSIS DNA-PCR(C282Y,H63D)

## 2017-12-10 ENCOUNTER — Encounter: Payer: Self-pay | Admitting: Family

## 2017-12-10 ENCOUNTER — Telehealth: Payer: Self-pay | Admitting: Family

## 2017-12-10 HISTORY — DX: Hereditary hemochromatosis: E83.110

## 2017-12-10 NOTE — Telephone Encounter (Signed)
Please let pt know that her lab work shows that she has one copy of the hemochromatosis gene which can cause elevation in iron levels.  Ideally I would like for her to meet with hematology for further management, however she is uninsured.  An alternative for her would be to donated blood every few months to keep her iron levels down in her blood.  Let me know if she is agreeable to heme consult and I will place.

## 2017-12-11 ENCOUNTER — Other Ambulatory Visit: Payer: Self-pay | Admitting: Family

## 2017-12-11 NOTE — Telephone Encounter (Signed)
Patient advised of results and she is going to look for local blood drives and donate blood.

## 2017-12-14 ENCOUNTER — Telehealth: Payer: Self-pay | Admitting: Interventional Cardiology

## 2017-12-14 MED ORDER — CARVEDILOL 12.5 MG PO TABS: ORAL_TABLET | ORAL | 0 refills | Status: DC

## 2017-12-14 NOTE — Telephone Encounter (Signed)
New Message:     *STAT* If patient is at the pharmacy, call can be transferred to refill team.   1. Which medications need to be refilled? (please list name of each medication and dose if known)    carvedilol (COREG) 12.5 MG tablet    2. Which pharmacy/location (including street and city if local pharmacy) is medication to be sent to? [place], [place] - [address]  3. Do they need a 30 day or 90 day supply? 90 Days

## 2017-12-14 NOTE — Telephone Encounter (Signed)
Pt calling requesting a refill on carvedilol. Dr. Lovena Le prescribed this medication. Pt has an appt with Dr. Irish Lack on 02/14/18. Would Dr. Irish Lack like to refill this medication? Please address

## 2017-12-14 NOTE — Telephone Encounter (Signed)
Refill for carvedilol sent in to last patient until next OV.

## 2017-12-16 ENCOUNTER — Other Ambulatory Visit: Payer: Self-pay | Admitting: Internal Medicine

## 2017-12-16 DIAGNOSIS — R002 Palpitations: Secondary | ICD-10-CM

## 2018-01-29 ENCOUNTER — Telehealth: Payer: Self-pay | Admitting: *Deleted

## 2018-01-29 NOTE — Telephone Encounter (Signed)
Received simvastatin request from [place]. Last OV 09/06/17 and last lipid panel 09/04/16.  Please advise?

## 2018-01-29 NOTE — Telephone Encounter (Signed)
OK to send 30 day supply.  Needs OV prior to additional refills.

## 2018-01-30 MED ORDER — SIMVASTATIN 10 MG PO TABS: 10.0000 mg | ORAL_TABLET | Freq: Every day | ORAL | 0 refills | Status: DC

## 2018-01-30 NOTE — Telephone Encounter (Signed)
30 day supply sent to pharmacy.  Please call pt to schedule follow up with Melissa before further refills are needed. Thanks!

## 2018-01-31 NOTE — Telephone Encounter (Signed)
Called pt and LVM regarding her Rx refill. Informed pt that a 30 day supply was sent, but she would need an OV before any further refills could be sent. Advised pt to call and schedule appt at her convenience.
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
## 2018-02-14 ENCOUNTER — Ambulatory Visit (INDEPENDENT_AMBULATORY_CARE_PROVIDER_SITE_OTHER): Payer: Self-pay | Admitting: Interventional Cardiology

## 2018-02-14 ENCOUNTER — Encounter: Payer: Self-pay | Admitting: Interventional Cardiology

## 2018-02-14 VITALS — BP 124/66 | HR 94 | Ht 65.0 in | Wt 160.4 lb

## 2018-02-14 DIAGNOSIS — Z01812 Encounter for preprocedural laboratory examination: Secondary | ICD-10-CM

## 2018-02-14 DIAGNOSIS — R002 Palpitations: Secondary | ICD-10-CM

## 2018-02-14 DIAGNOSIS — I471 Supraventricular tachycardia: Secondary | ICD-10-CM

## 2018-02-14 DIAGNOSIS — R072 Precordial pain: Secondary | ICD-10-CM

## 2018-02-14 DIAGNOSIS — I493 Ventricular premature depolarization: Secondary | ICD-10-CM

## 2018-02-14 DIAGNOSIS — I483 Typical atrial flutter: Secondary | ICD-10-CM

## 2018-02-14 DIAGNOSIS — I491 Atrial premature depolarization: Secondary | ICD-10-CM

## 2018-02-14 MED ORDER — FLECAINIDE ACETATE 50 MG PO TABS: ORAL_TABLET | ORAL | 2 refills | Status: DC

## 2018-02-14 MED ORDER — METOPROLOL TARTRATE 100 MG PO TABS: ORAL_TABLET | ORAL | 0 refills | Status: DC

## 2018-02-14 NOTE — Patient Instructions (Addendum)
Medication Instructions:  Your physician recommends that you continue on your current medications as directed. Please refer to the Current Medication list given to you today.  If you need a refill on your cardiac medications before your next appointment, please call your pharmacy.   Lab work: Your physician recommends that you return for a FASTING lipid profile and complete metabolic panel   If you have labs (blood work) drawn today and your tests are completely normal, you will receive your results only by: Marland Kitchen MyChart Message (if you have MyChart) OR . A paper copy in the mail If you have any lab test that is abnormal or we need to change your treatment, we will call you to review the results.  Testing/Procedures: Your physician has requested that you have an exercise tolerance test. For further information please visit [email or web address]. Please also follow instruction sheet, as given.  Follow-Up: At [facility], you and your health needs are our priority.  As part of our continuing mission to provide you with exceptional heart care, we have created designated Provider Care Teams.  These Care Teams include your primary Cardiologist (physician) and Advanced Practice Providers (APPs -  Physician Assistants and Nurse Practitioners) who all work together to provide you with the care you need, when you need it. . You will need a follow up appointment in 1 year.  Please call our office 2 months in advance to schedule this appointment.  You may see Casandra Doffing, MD or one of the following Advanced Practice Providers on your designated Care Team:   . Lyda Jester, PA-C . Dayna Dunn, PA-C . Ermalinda Barrios, PA-C  Any Other Special Instructions Will Be Listed Below (If Applicable).  Marland Kitchen

## 2018-02-15 NOTE — Telephone Encounter (Signed)
Mailed letter to pt

## 2018-02-21 ENCOUNTER — Other Ambulatory Visit: Payer: Self-pay | Admitting: *Deleted

## 2018-02-21 ENCOUNTER — Ambulatory Visit (INDEPENDENT_AMBULATORY_CARE_PROVIDER_SITE_OTHER): Payer: Self-pay

## 2018-02-21 DIAGNOSIS — R072 Precordial pain: Secondary | ICD-10-CM

## 2018-02-21 DIAGNOSIS — R002 Palpitations: Secondary | ICD-10-CM

## 2018-02-21 DIAGNOSIS — Z01812 Encounter for preprocedural laboratory examination: Secondary | ICD-10-CM

## 2018-02-21 DIAGNOSIS — I493 Ventricular premature depolarization: Secondary | ICD-10-CM

## 2018-02-21 DIAGNOSIS — I491 Atrial premature depolarization: Secondary | ICD-10-CM

## 2018-02-21 DIAGNOSIS — I483 Typical atrial flutter: Secondary | ICD-10-CM

## 2018-02-21 DIAGNOSIS — I471 Supraventricular tachycardia: Secondary | ICD-10-CM

## 2018-02-21 LAB — SPECIMEN STATUS

## 2018-02-21 LAB — EXERCISE TOLERANCE TEST
CSEPEDS: 9 s
CSEPEW: 5.9 METS
CSEPHR: 87 %
CSEPPHR: 139 {beats}/min
Exercise duration (min): 4 min
MPHR: 158 {beats}/min
RPE: 17
Rest HR: 90 {beats}/min

## 2018-02-22 ENCOUNTER — Telehealth: Payer: Self-pay

## 2018-02-22 NOTE — Telephone Encounter (Signed)
-----   Message from Jettie Booze, MD sent at 02/21/2018  6:12 PM EST ----- Normal stress test. Continue flecainide.

## 2018-02-22 NOTE — Telephone Encounter (Signed)
-----   Message from Jettie Booze, MD sent at 02/21/2018  6:13 PM EST ----- Glucose and TG elevated.  Other labs normal. Needs healthy diet to decrease sugar.  THis will help the TG. Please cc PMD as well.

## 2018-02-22 NOTE — Telephone Encounter (Signed)
Follow up   Patient is returning your call for lab results.

## 2018-02-22 NOTE — Telephone Encounter (Deleted)
Left message for patient to call back  

## 2018-02-22 NOTE — Telephone Encounter (Signed)
Patient notified of stress test results as well as lab results and recommendations to decrease the amount of sugars in her diet.  Please refer to phone note from today for complete details.  Results sent to PCP. Cleon Gustin, RN 02/22/2018 4:56 PM

## 2018-02-22 NOTE — Telephone Encounter (Signed)
Left message for patient to call back  

## 2018-02-25 ENCOUNTER — Encounter: Payer: Self-pay | Admitting: Family

## 2018-02-25 ENCOUNTER — Ambulatory Visit (INDEPENDENT_AMBULATORY_CARE_PROVIDER_SITE_OTHER): Payer: Self-pay | Admitting: Family

## 2018-02-25 VITALS — BP 125/70 | HR 94 | Temp 98.6°F | Resp 16 | Ht 65.0 in | Wt 159.0 lb

## 2018-02-25 DIAGNOSIS — Z720 Tobacco use: Secondary | ICD-10-CM

## 2018-02-25 DIAGNOSIS — E0590 Thyrotoxicosis, unspecified without thyrotoxic crisis or storm: Secondary | ICD-10-CM

## 2018-02-25 DIAGNOSIS — I10 Essential (primary) hypertension: Secondary | ICD-10-CM

## 2018-02-25 DIAGNOSIS — E785 Hyperlipidemia, unspecified: Secondary | ICD-10-CM

## 2018-02-25 DIAGNOSIS — E119 Type 2 diabetes mellitus without complications: Secondary | ICD-10-CM

## 2018-02-25 NOTE — Patient Instructions (Signed)
Please complete lab work prior to leaving. Get your flu shot at your local pharmacy ASAP.

## 2018-02-25 NOTE — Progress Notes (Signed)
Subjective:    [name], female    DOB: September 18, 1955, 62 y.o.   MRN: [id number]  HPI  Patient is a 62 yr old female who presents today for follow up.  DM2- maintained on metformin, amaryl,  Lab Results  Component Value Date   HGBA1C 8.0 (H) 09/06/2017   HGBA1C 6.7 (H) 01/22/2017   HGBA1C 7.3 (H) 09/04/2016   Lab Results  Component Value Date   MICROALBUR <0.7 05/10/2015   LDLCALC 46 02/21/2018   CREATININE 0.70 02/21/2018   Hyperlipidemia-  Lab Results  Component Value Date   CHOL 149 02/21/2018   HDL 40 02/21/2018   LDLCALC 46 02/21/2018   TRIG 315 (H) 02/21/2018   CHOLHDL 3.7 02/21/2018   HTN-  BP Readings from Last 3 Encounters:  02/25/18 125/70  02/14/18 124/66  09/06/17 133/72   Hyperthyroid Lab Results  Component Value Date   TSH 0.05 (L) 05/16/2016   Lab Results  Component Value Date   CHOL 149 02/21/2018   HDL 40 02/21/2018   LDLCALC 46 02/21/2018   TRIG 315 (H) 02/21/2018   CHOLHDL 3.7 02/21/2018    Review of Systems See HPI  Past Medical History:  Diagnosis Date  . Atrial flutter [facility])    s/p CTI by Dr Lovena Le 02/2013  . Chest pain   . Diabetes mellitus without complication ([place])   . 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 Hemochromatosis associated with compound heterozygous mutation in HFE gene ([place]) 12/10/2017  . Hypertension   . Hyperthyroidism 10/31/2014     Social History   Socioeconomic History  . Marital status: Married    Spouse name: Not on file  . Number of children: Not on file  . Years of education: Not on file  . Highest education level: Not on file  Occupational History  . Not on file  Social Needs  . Financial resource strain: Not on file  . Food insecurity:    Worry: Not on file    Inability: Not on file  . Transportation needs:    Medical: Not on file    Non-medical: Not on file  Tobacco Use  . Smoking status: Current Every Day Smoker    Packs/day: 1.00    Types:  Cigarettes  . Smokeless tobacco: Never Used  Substance and Sexual Activity  . Alcohol use: No    Alcohol/week: 0.0 standard drinks  . Drug use: No  . Sexual activity: Not on file  Lifestyle  . Physical activity:    Days per week: Not on file    Minutes per session: Not on file  . Stress: Not on file  Relationships  . Social connections:    Talks on phone: Not on file    Gets together: Not on file    Attends religious service: Not on file    Active member of club or organization: Not on file    Attends meetings of clubs or organizations: Not on file    Relationship status: Not on file  . Intimate partner violence:    Fear of current or ex partner: Not on file    Emotionally abused: Not on file    Physically abused: Not on file    Forced sexual activity: Not on file  Other Topics Concern  . Not on file  Social History Narrative   Married   Clinical cytogeneticist- full time   Daughter- 2 grandchildren, live in [place]   Enjoys playing on  computer.      Past Surgical History:  Procedure Laterality Date  . ABLATION  03-04-2013   CTI by Dr Lovena Le for atrial flutter  . ATRIAL FLUTTER ABLATION N/A 03/04/2013   Procedure: ATRIAL FLUTTER ABLATION;  Surgeon: Evans Lance, MD;  Location: [facility] CATH LAB;  Service: Cardiovascular;  Laterality: N/A;  . CHOLECYSTECTOMY  1982  . ESOPHAGOGASTRODUODENOSCOPY N/A 05/20/2014   Procedure: ESOPHAGOGASTRODUODENOSCOPY (EGD);  Surgeon: Teena Irani, MD;  Location: [facility];  Service: Endoscopy;  Laterality: N/A;  . TONSILLECTOMY  1972 ?    Family History  Problem Relation Age of Onset  . Atrial fibrillation Mother   . Arrhythmia Mother   . Diabetes Mother   . Hodgkin's lymphoma Father     Allergies  Allergen Reactions  . Jardiance [Empagliflozin]     Causes heart palpitations  . Trazodone And Nefazodone     "hyped me up, could not sleep"    Current Outpatient Medications on File Prior to Visit  Medication Sig Dispense Refill  . amLODipine  (NORVASC) 10 MG tablet TAKE 1 TABLET BY MOUTH ONCE DAILY 90 tablet 1  . Bioflavonoid Products (ESTER C PO) Take 1 tablet by mouth daily.    . carvedilol (COREG) 12.5 MG tablet TAKE 1 TABLET BY MOUTH TWICE DAILY 180 tablet 0  . flecainide (TAMBOCOR) 50 MG tablet Take 1 tablet by mouth twice a day, may take an additional 1 tablet as needed. 270 tablet 2  . furosemide (LASIX) 20 MG tablet Take 1 tablet (20 mg total) daily by mouth. 90 tablet 1  . glimepiride (AMARYL) 4 MG tablet TAKE 1 TABLET BY MOUTH ONCE DAILY BEFORE BREAKFAST 90 tablet 0  . lisinopril-hydrochlorothiazide (PRINZIDE,ZESTORETIC) 20-25 MG tablet Take 1 tablet by mouth daily. 90 tablet 0  . metFORMIN (GLUCOPHAGE) 500 MG tablet Take 2 tablets (1,000 mg total) 2 (two) times daily by mouth. 180 tablet 1  . Omega-3 Fatty Acids (FISH OIL) 1200 MG CAPS Take 1,200 mg by mouth daily.     . potassium chloride SA (K-DUR,KLOR-CON) 20 MEQ tablet TAKE 1 TABLET BY MOUTH ONCE DAILY **PATIENT  NEEDS  OFFICE  VISIT  FOR  GREATER  QUANTITIES/  REFILLS**. 90 tablet 0  . simvastatin (ZOCOR) 10 MG tablet Take 1 tablet (10 mg total) by mouth at bedtime. 30 tablet 0   No current facility-administered medications on file prior to visit.     BP 125/70 (BP Location: Right Arm, Patient Position: Sitting, Cuff Size: Small)   Pulse 94   Temp 98.6 F (37 C) (Oral)   Resp 16   Ht 5\' 5"  (1.651 m)   Wt 159 lb (72.1 kg)   SpO2 94%   BMI 26.46 kg/m       Objective:   Physical Exam Constitutional:      Appearance: She is well-developed.  Neck:     Musculoskeletal: Neck supple.     Thyroid: No thyromegaly.  Cardiovascular:     Rate and Rhythm: Normal rate and regular rhythm.     Heart sounds: Normal heart sounds. No murmur.  Pulmonary:     Effort: Pulmonary effort is normal. No respiratory distress.     Breath sounds: Normal breath sounds. No wheezing.  Skin:    General: Skin is warm and dry.  Neurological:     Mental Status: She is alert and  oriented to person, place, and time.  Psychiatric:        Behavior: Behavior normal.  Thought Content: Thought content normal.        Judgment: Judgment normal.           Assessment & Plan:  HTN- bp is stable on current meds.  Hyperthyroid- pt cannot afford endocrinology- she is self pay. Check follow up TFT's.   Hyperlipidemia- LDL at goal, trigs high- discussed diabetic diet.   Tobacco abuse- has set quit date for 03/13/18- encouraged cessation.  DM2- obtain follow up A1C.

## 2018-02-26 LAB — TSH: TSH: 0.04 u[IU]/mL — ABNORMAL LOW (ref 0.35–4.50)

## 2018-02-26 LAB — HEMOGLOBIN A1C: Hgb A1c MFr Bld: 9.3 % — ABNORMAL HIGH (ref 4.6–6.5)

## 2018-02-26 LAB — T3, FREE: T3, Free: 3.5 pg/mL (ref 2.3–4.2)

## 2018-02-26 LAB — T4, FREE: Free T4: 1.05 ng/dL (ref 0.60–1.60)

## 2018-02-27 ENCOUNTER — Telehealth: Payer: Self-pay | Admitting: Family

## 2018-02-27 DIAGNOSIS — E0590 Thyrotoxicosis, unspecified without thyrotoxic crisis or storm: Secondary | ICD-10-CM

## 2018-02-27 MED ORDER — PIOGLITAZONE HCL 30 MG PO TABS: 30.0000 mg | ORAL_TABLET | Freq: Every day | ORAL | 5 refills | Status: DC

## 2018-02-27 NOTE — Telephone Encounter (Signed)
Also, sugar is uncontrolled. I would like her to work on diet and add actos once daily.

## 2018-02-27 NOTE — Telephone Encounter (Signed)
I would recommend that she print off a coupon from [facility] for this medication. With the coupon it should be between nine to $11 per month. Unfortunately there is nothing cheaper that is generic.

## 2018-02-27 NOTE — Telephone Encounter (Signed)
Patient advised to go on [facility] and get coupon for Actos.

## 2018-02-27 NOTE — Telephone Encounter (Signed)
Please contact pt and let her know that I reviewed her lab work. She is still has overactive thyroid.  I would like her to return for a different thyroid test to help [place] determine cause for her hyperactive thyroid.

## 2018-02-27 NOTE — Telephone Encounter (Signed)
Results given to patient, she will come in on Monday for her Thyrotropin receptor test.  She said she was prescribed Actos before and can not afford this medication. Please advise of something cheaper.

## 2018-03-03 ENCOUNTER — Other Ambulatory Visit: Payer: Self-pay | Admitting: Family

## 2018-03-04 ENCOUNTER — Other Ambulatory Visit: Payer: Self-pay

## 2018-03-07 ENCOUNTER — Telehealth: Payer: Self-pay

## 2018-03-07 ENCOUNTER — Other Ambulatory Visit: Payer: Self-pay | Admitting: Family

## 2018-03-07 DIAGNOSIS — D508 Other iron deficiency anemias: Secondary | ICD-10-CM

## 2018-03-07 DIAGNOSIS — E876 Hypokalemia: Secondary | ICD-10-CM

## 2018-03-07 MED ORDER — POTASSIUM CHLORIDE CRYS ER 20 MEQ PO TBCR: EXTENDED_RELEASE_TABLET | ORAL | 0 refills | Status: DC

## 2018-03-07 NOTE — Telephone Encounter (Signed)
Potassium chloride refilled per protocol.

## 2018-03-08 ENCOUNTER — Other Ambulatory Visit (INDEPENDENT_AMBULATORY_CARE_PROVIDER_SITE_OTHER): Payer: Self-pay

## 2018-03-08 DIAGNOSIS — E0590 Thyrotoxicosis, unspecified without thyrotoxic crisis or storm: Secondary | ICD-10-CM

## 2018-03-09 LAB — COMPREHENSIVE METABOLIC PANEL
ALT: 18 IU/L (ref 0–32)
AST: 23 IU/L (ref 0–40)
Albumin/Globulin Ratio: 1.7 (ref 1.2–2.2)
Albumin: 4.3 g/dL (ref 3.6–4.8)
Alkaline Phosphatase: 119 IU/L — ABNORMAL HIGH (ref 39–117)
BUN/Creatinine Ratio: 20 (ref 12–28)
BUN: 14 mg/dL (ref 8–27)
Bilirubin Total: 0.5 mg/dL (ref 0.0–1.2)
CO2: 23 mmol/L (ref 20–29)
Calcium: 9.9 mg/dL (ref 8.7–10.3)
Chloride: 99 mmol/L (ref 96–106)
Creatinine, Ser: 0.7 mg/dL (ref 0.57–1.00)
GFR calc Af Amer: 107 mL/min/{1.73_m2} (ref >59)
GFR calc non Af Amer: 93 mL/min/{1.73_m2} (ref >59)
GLOBULIN, TOTAL: 2.5 g/dL (ref 1.5–4.5)
Glucose: 243 mg/dL — ABNORMAL HIGH (ref 65–99)
Potassium: 4 mmol/L (ref 3.5–5.2)
Sodium: 140 mmol/L (ref 134–144)
TOTAL PROTEIN: 6.8 g/dL (ref 6.0–8.5)

## 2018-03-09 LAB — LIPID PANEL
Chol/HDL Ratio: 3.7 ratio (ref 0.0–4.4)
Cholesterol, Total: 149 mg/dL (ref 100–199)
HDL: 40 mg/dL (ref >39)
LDL Calculated: 46 mg/dL (ref 0–99)
Triglycerides: 315 mg/dL — ABNORMAL HIGH (ref 0–149)
VLDL Cholesterol Cal: 63 mg/dL — ABNORMAL HIGH (ref 5–40)

## 2018-03-11 LAB — THYROTROPIN RECEPTOR AUTOABS: Thyrotropin Receptor Ab: 41.3 % — ABNORMAL HIGH (ref <=16.0)

## 2018-03-15 ENCOUNTER — Other Ambulatory Visit: Payer: Self-pay

## 2018-03-15 ENCOUNTER — Telehealth: Payer: Self-pay | Admitting: Family

## 2018-03-15 DIAGNOSIS — E0590 Thyrotoxicosis, unspecified without thyrotoxic crisis or storm: Secondary | ICD-10-CM

## 2018-03-15 MED ORDER — METHIMAZOLE 10 MG PO TABS: 15.0000 mg | ORAL_TABLET | Freq: Every day | ORAL | 2 refills | Status: DC

## 2018-03-15 NOTE — Progress Notes (Signed)
t

## 2018-03-15 NOTE — Telephone Encounter (Signed)
Please contact pt and let her know that I reviewed her thyroid tests and she is still showing overactive thyroid. I would like her to start tapazole 15mg  once daily. Repeat lab work in 6 weeks.

## 2018-03-15 NOTE — Telephone Encounter (Signed)
Patient advised of results and that medication was already sent to her pharmacy. Patient will call to schedule lab appointment. Orders for TSH entered as future.

## 2018-03-22 ENCOUNTER — Telehealth: Payer: Self-pay

## 2018-03-22 NOTE — Telephone Encounter (Signed)
I reviewed these two medications together and they are safe to take together.

## 2018-03-22 NOTE — Telephone Encounter (Signed)
Copied from [place] [id number]. Topic: Quick Communication - Rx Refill/Question >> Mar 22, 2018  2:54 PM Scherrie Gerlach wrote: Medication: methimazole (TAPAZOLE) 10 MG tablet  Pt states she looked up information about this med and there is a possible interaction with it and carvedilol (COREG) 12.5 MG tablet (which pt takes). Pt wants to hear from Dr Charlett Blake that it is ok for her to take these 2 together. Pt does not remember what the interaction was.

## 2018-03-25 NOTE — Telephone Encounter (Signed)
Patient advised of medications safe to take together.

## 2018-04-10 ENCOUNTER — Other Ambulatory Visit: Payer: Self-pay | Admitting: Family Medicine

## 2018-04-25 ENCOUNTER — Other Ambulatory Visit: Payer: Self-pay | Admitting: Interventional Cardiology

## 2018-05-13 ENCOUNTER — Other Ambulatory Visit: Payer: Self-pay | Admitting: Family Medicine

## 2018-05-15 ENCOUNTER — Other Ambulatory Visit: Payer: Self-pay | Admitting: Family

## 2018-05-28 ENCOUNTER — Ambulatory Visit (INDEPENDENT_AMBULATORY_CARE_PROVIDER_SITE_OTHER): Payer: Self-pay | Admitting: Family

## 2018-05-28 ENCOUNTER — Encounter: Payer: Self-pay | Admitting: Family

## 2018-05-28 ENCOUNTER — Other Ambulatory Visit: Payer: Self-pay

## 2018-05-28 VITALS — BP 138/78 | HR 91 | Temp 98.6°F | Resp 16 | Wt 157.4 lb

## 2018-05-28 DIAGNOSIS — E785 Hyperlipidemia, unspecified: Secondary | ICD-10-CM

## 2018-05-28 DIAGNOSIS — E119 Type 2 diabetes mellitus without complications: Secondary | ICD-10-CM

## 2018-05-28 DIAGNOSIS — E876 Hypokalemia: Secondary | ICD-10-CM

## 2018-05-28 DIAGNOSIS — I10 Essential (primary) hypertension: Secondary | ICD-10-CM

## 2018-05-28 DIAGNOSIS — E0590 Thyrotoxicosis, unspecified without thyrotoxic crisis or storm: Secondary | ICD-10-CM

## 2018-05-28 MED ORDER — GLIMEPIRIDE 4 MG PO TABS: ORAL_TABLET | ORAL | 0 refills | Status: DC

## 2018-05-28 MED ORDER — LISINOPRIL-HYDROCHLOROTHIAZIDE 20-25 MG PO TABS: 1.0000 | ORAL_TABLET | Freq: Every day | ORAL | 1 refills | Status: DC

## 2018-05-28 MED ORDER — METFORMIN HCL 500 MG PO TABS: 1000.0000 mg | ORAL_TABLET | Freq: Two times a day (BID) | ORAL | 1 refills | Status: DC

## 2018-05-28 MED ORDER — AMLODIPINE BESYLATE 10 MG PO TABS: 10.0000 mg | ORAL_TABLET | Freq: Every day | ORAL | 1 refills | Status: DC

## 2018-05-28 MED ORDER — PIOGLITAZONE HCL 30 MG PO TABS: 30.0000 mg | ORAL_TABLET | Freq: Every day | ORAL | 1 refills | Status: DC

## 2018-05-28 MED ORDER — SIMVASTATIN 10 MG PO TABS: 10.0000 mg | ORAL_TABLET | Freq: Every day | ORAL | 1 refills | Status: DC

## 2018-05-28 MED ORDER — METHIMAZOLE 10 MG PO TABS: 15.0000 mg | ORAL_TABLET | Freq: Every day | ORAL | 2 refills | Status: DC

## 2018-05-28 MED ORDER — POTASSIUM CHLORIDE CRYS ER 20 MEQ PO TBCR: EXTENDED_RELEASE_TABLET | ORAL | 1 refills | Status: DC

## 2018-05-28 MED ORDER — FUROSEMIDE 20 MG PO TABS: 20.0000 mg | ORAL_TABLET | Freq: Every day | ORAL | 1 refills | Status: DC

## 2018-05-28 NOTE — Progress Notes (Signed)
Subjective:    [name], female    DOB: 05-10-1955, 63 y.o.   MRN: [id number]  HPI  Patient presents today for follow up.    Hyperthyroidism-maintained on tapazole. Lab Results  Component Value Date   TSH 0.04 (L) 02/25/2018    DM2- on glimepiride and metformin. Not taking actos because her mother was allergic to it and she is afraid she will be allergic too. Lab Results  Component Value Date   HGBA1C 9.3 (H) 02/25/2018   HGBA1C 8.0 (H) 09/06/2017   HGBA1C 6.7 (H) 01/22/2017   Lab Results  Component Value Date   MICROALBUR <0.7 05/10/2015   LDLCALC 46 02/21/2018   CREATININE 0.70 02/21/2018   HTN- bp meds include amlodipine 10mg , furosemide 20mg , and lisinopril-hctz 20-25.    BP Readings from Last 3 Encounters:  05/28/18 138/78  02/25/18 125/70  02/14/18 124/66   Denies cough or cold symptoms. Reports that she continues to smoke but has cut down significantly.  Hyperlipidemia- on simvastatin 10 mg.   Review of Systems See HPI  Past Medical History:  Diagnosis Date  . Atrial flutter [facility])    s/p CTI by Dr Lovena Le 02/2013  . Chest pain   . Diabetes mellitus without complication ([place])   . 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 Hemochromatosis associated with compound heterozygous mutation in HFE gene ([place]) 12/10/2017  . Hypertension   . Hyperthyroidism 10/31/2014     Social History   Socioeconomic History  . Marital status: Married    Spouse name: Not on file  . Number of children: Not on file  . Years of education: Not on file  . Highest education level: Not on file  Occupational History  . Not on file  Social Needs  . Financial resource strain: Not on file  . Food insecurity:    Worry: Not on file    Inability: Not on file  . Transportation needs:    Medical: Not on file    Non-medical: Not on file  Tobacco Use  . Smoking status: Current Every Day Smoker    Packs/day: 1.00    Types: Cigarettes  . Smokeless  tobacco: Never Used  Substance and Sexual Activity  . Alcohol use: No    Alcohol/week: 0.0 standard drinks  . Drug use: No  . Sexual activity: Not on file  Lifestyle  . Physical activity:    Days per week: Not on file    Minutes per session: Not on file  . Stress: Not on file  Relationships  . Social connections:    Talks on phone: Not on file    Gets together: Not on file    Attends religious service: Not on file    Active member of club or organization: Not on file    Attends meetings of clubs or organizations: Not on file    Relationship status: Not on file  . Intimate partner violence:    Fear of current or ex partner: Not on file    Emotionally abused: Not on file    Physically abused: Not on file    Forced sexual activity: Not on file  Other Topics Concern  . Not on file  Social History Narrative   Married   Clinical cytogeneticist- full time   Daughter- 2 grandchildren, live in [place]   Enjoys playing on computer.      Past Surgical History:  Procedure Laterality Date  . ABLATION  03-04-2013  CTI by Dr Lovena Le for atrial flutter  . ATRIAL FLUTTER ABLATION N/A 03/04/2013   Procedure: ATRIAL FLUTTER ABLATION;  Surgeon: Evans Lance, MD;  Location: [facility] CATH LAB;  Service: Cardiovascular;  Laterality: N/A;  . CHOLECYSTECTOMY  1982  . ESOPHAGOGASTRODUODENOSCOPY N/A 05/20/2014   Procedure: ESOPHAGOGASTRODUODENOSCOPY (EGD);  Surgeon: Teena Irani, MD;  Location: [facility];  Service: Endoscopy;  Laterality: N/A;  . TONSILLECTOMY  1972 ?    Family History  Problem Relation Age of Onset  . Atrial fibrillation Mother   . Arrhythmia Mother   . Diabetes Mother   . Hodgkin's lymphoma Father     Allergies  Allergen Reactions  . Jardiance [Empagliflozin]     Causes heart palpitations  . Trazodone And Nefazodone     "hyped me up, could not sleep"    Current Outpatient Medications on File Prior to Visit  Medication Sig Dispense Refill  . Bioflavonoid Products (ESTER C PO)  Take 1 tablet by mouth daily.    . carvedilol (COREG) 12.5 MG tablet TAKE 1 TABLET BY MOUTH TWICE DAILY ( PATIENT MUST KEEP UPCOMING APPOINTMENT ) 180 tablet 3  . flecainide (TAMBOCOR) 50 MG tablet Take 1 tablet by mouth twice a day, may take an additional 1 tablet as needed. 270 tablet 2  . Omega-3 Fatty Acids (FISH OIL) 1200 MG CAPS Take 1,200 mg by mouth daily.      No current facility-administered medications on file prior to visit.     BP 138/78   Pulse 91   Temp 98.6 F (37 C) (Oral)   Resp 16   Wt 157 lb 6.4 oz (71.4 kg)   SpO2 98%   BMI 26.19 kg/m       Objective:   Physical Exam Constitutional:      Appearance: She is well-developed.  Neck:     Musculoskeletal: Neck supple.     Thyroid: No thyromegaly.  Cardiovascular:     Rate and Rhythm: Normal rate and regular rhythm.     Heart sounds: Normal heart sounds. No murmur.  Pulmonary:     Effort: Pulmonary effort is normal. No respiratory distress.     Breath sounds: Examination of the right-lower field reveals wheezing. Wheezing present.  Skin:    General: Skin is warm and dry.  Neurological:     Mental Status: She is alert and oriented to person, place, and time.  Psychiatric:        Behavior: Behavior normal.        Thought Content: Thought content normal.        Judgment: Judgment normal.           Assessment & Plan:  DM2- suspect A1C will still be high. Obtain follow up A1C. If still high she is agreeable to trial of actos.  Hyperthyroid- clinically stable. Continue tapazole, obtain follow up TSH.  Hyperlipidemia- ldl is at goal. Continue simvastatin.   HTN- initial bp was high upon arrival. Follow up bp is improved. Continue current meds/doses.

## 2018-05-28 NOTE — Patient Instructions (Addendum)
Please complete lab work prior to leaving.   

## 2018-05-29 LAB — BASIC METABOLIC PANEL
BUN: 14 mg/dL (ref 6–23)
CO2: 29 mEq/L (ref 19–32)
Calcium: 9.7 mg/dL (ref 8.4–10.5)
Chloride: 98 mEq/L (ref 96–112)
Creatinine, Ser: 0.66 mg/dL (ref 0.40–1.20)
GFR: 90.49 mL/min (ref >60.00)
Glucose, Bld: 251 mg/dL — ABNORMAL HIGH (ref 70–99)
Potassium: 3.7 mEq/L (ref 3.5–5.1)
Sodium: 138 mEq/L (ref 135–145)

## 2018-05-29 LAB — TSH: TSH: 0.05 u[IU]/mL — ABNORMAL LOW (ref 0.35–4.50)

## 2018-05-29 LAB — HEMOGLOBIN A1C: Hgb A1c MFr Bld: 10.9 % — ABNORMAL HIGH (ref 4.6–6.5)

## 2018-05-30 ENCOUNTER — Other Ambulatory Visit: Payer: Self-pay | Admitting: Family

## 2018-05-30 NOTE — Telephone Encounter (Signed)
Please call pt and let her know that her sugar is very uncontrolled.  I would recommend that she start NPH insulin 5 units twice daily.  (before breakfast and before dinner). I have pended insulin and supplies. OK to start actos, but she should decrease amaryl to 1/2 tab once daily.   Please schedule nurse visit for insulin administration teaching. She should check fasting sugar every day and then one additional sugar reading later in the evening and record in her log.  Send me her readings via my chart 1 week after she starts insulin.   Does she have a meter?  Please sent rx for test strips for her brand of meter. If she does not have a meter ok to send rx for glucometer.

## 2018-05-31 NOTE — Telephone Encounter (Signed)
Called patient a few times but no answer lm for her to call aboiut this rx request message from [place].

## 2018-06-04 NOTE — Telephone Encounter (Signed)
Called patient again today., she has not call back about this message. Information forwarded to her on a MyChart message.

## 2018-06-09 MED ORDER — GLIMEPIRIDE 4 MG PO TABS: 2.0000 mg | ORAL_TABLET | Freq: Every day | ORAL | 0 refills | Status: DC

## 2018-06-09 MED ORDER — "INSULIN SYRINGE-NEEDLE U-100 25G X 5/8"" 1 ML MISC": 3 refills | Status: DC

## 2018-06-09 MED ORDER — LANCETS 30G MISC: 3 refills | Status: DC

## 2018-06-09 MED ORDER — INSULIN NPH (HUMAN) (ISOPHANE) 100 UNIT/ML ~~LOC~~ SUSP: 5.0000 [IU] | Freq: Two times a day (BID) | SUBCUTANEOUS | 11 refills | Status: DC

## 2018-07-26 ENCOUNTER — Other Ambulatory Visit: Payer: Self-pay | Admitting: Family

## 2018-07-26 MED ORDER — METFORMIN HCL 500 MG PO TABS: 1000.0000 mg | ORAL_TABLET | Freq: Two times a day (BID) | ORAL | 1 refills | Status: DC

## 2018-07-26 NOTE — Telephone Encounter (Signed)
Copied from [place] [id number]. Topic: Quick Communication - Rx Refill/Question >> Jul 26, 2018  2:22 PM Rayann Heman wrote: Medication: metFORMIN (GLUCOPHAGE) 500 MG tablet [[id number]]  Has the patient contacted their pharmacy? No Preferred Pharmacy (with phone number or street name): [place], [place] - [address] [phone] (Phone) [phone] (Fax)    Agent: Please be advised that RX refills may take up to 3 business days. We ask that you follow-up with your pharmacy.

## 2018-12-06 ENCOUNTER — Other Ambulatory Visit: Payer: Self-pay | Admitting: Family

## 2018-12-06 DIAGNOSIS — E876 Hypokalemia: Secondary | ICD-10-CM

## 2018-12-06 NOTE — Telephone Encounter (Signed)
She was supposed to follow up with Melissa in June for a 3 mo f/u. Please sched f/u. Ty.

## 2019-02-18 ENCOUNTER — Other Ambulatory Visit: Payer: Self-pay | Admitting: Interventional Cardiology

## 2019-02-18 DIAGNOSIS — R002 Palpitations: Secondary | ICD-10-CM

## 2019-03-05 ENCOUNTER — Other Ambulatory Visit: Payer: Self-pay | Admitting: Family Medicine

## 2019-03-05 DIAGNOSIS — E876 Hypokalemia: Secondary | ICD-10-CM

## 2019-03-21 ENCOUNTER — Encounter: Payer: Self-pay | Admitting: Family

## 2019-03-21 ENCOUNTER — Inpatient Hospital Stay (HOSPITAL_COMMUNITY)
Admission: EM | Admit: 2019-03-21 | Discharge: 2019-03-28 | DRG: 871 | Disposition: A | Discharge status: Home / Self Care | Payer: 59 | Attending: Internal Medicine | Admitting: Internal Medicine

## 2019-03-21 DIAGNOSIS — I4891 Unspecified atrial fibrillation: Secondary | ICD-10-CM | POA: Diagnosis present

## 2019-03-21 DIAGNOSIS — A4151 Sepsis due to Escherichia coli [E. coli]: Principal | ICD-10-CM | POA: Diagnosis present

## 2019-03-21 DIAGNOSIS — A419 Sepsis, unspecified organism: Secondary | ICD-10-CM | POA: Diagnosis present

## 2019-03-21 DIAGNOSIS — C3490 Malignant neoplasm of unspecified part of unspecified bronchus or lung: Secondary | ICD-10-CM | POA: Diagnosis present

## 2019-03-21 DIAGNOSIS — I700 Atherosclerosis of aorta: Secondary | ICD-10-CM | POA: Diagnosis present

## 2019-03-21 DIAGNOSIS — E1310 Other specified diabetes mellitus with ketoacidosis without coma: Secondary | ICD-10-CM

## 2019-03-21 DIAGNOSIS — E860 Dehydration: Secondary | ICD-10-CM | POA: Diagnosis present

## 2019-03-21 DIAGNOSIS — K5792 Diverticulitis of intestine, part unspecified, without perforation or abscess without bleeding: Secondary | ICD-10-CM

## 2019-03-21 DIAGNOSIS — Z794 Long term (current) use of insulin: Secondary | ICD-10-CM

## 2019-03-21 DIAGNOSIS — E1110 Type 2 diabetes mellitus with ketoacidosis without coma: Secondary | ICD-10-CM | POA: Diagnosis present

## 2019-03-21 DIAGNOSIS — J9621 Acute and chronic respiratory failure with hypoxia: Secondary | ICD-10-CM | POA: Diagnosis present

## 2019-03-21 DIAGNOSIS — Z9089 Acquired absence of other organs: Secondary | ICD-10-CM

## 2019-03-21 DIAGNOSIS — J449 Chronic obstructive pulmonary disease, unspecified: Secondary | ICD-10-CM | POA: Diagnosis present

## 2019-03-21 DIAGNOSIS — Z79899 Other long term (current) drug therapy: Secondary | ICD-10-CM

## 2019-03-21 DIAGNOSIS — Z807 Family history of other malignant neoplasms of lymphoid, hematopoietic and related tissues: Secondary | ICD-10-CM

## 2019-03-21 DIAGNOSIS — E0590 Thyrotoxicosis, unspecified without thyrotoxic crisis or storm: Secondary | ICD-10-CM | POA: Diagnosis present

## 2019-03-21 DIAGNOSIS — J811 Chronic pulmonary edema: Secondary | ICD-10-CM | POA: Diagnosis present

## 2019-03-21 DIAGNOSIS — I471 Supraventricular tachycardia, unspecified: Secondary | ICD-10-CM | POA: Diagnosis present

## 2019-03-21 DIAGNOSIS — Z8249 Family history of ischemic heart disease and other diseases of the circulatory system: Secondary | ICD-10-CM

## 2019-03-21 DIAGNOSIS — Z888 Allergy status to other drugs, medicaments and biological substances status: Secondary | ICD-10-CM

## 2019-03-21 DIAGNOSIS — I4581 Long QT syndrome: Secondary | ICD-10-CM | POA: Diagnosis present

## 2019-03-21 DIAGNOSIS — E876 Hypokalemia: Secondary | ICD-10-CM | POA: Diagnosis present

## 2019-03-21 DIAGNOSIS — R918 Other nonspecific abnormal finding of lung field: Secondary | ICD-10-CM

## 2019-03-21 DIAGNOSIS — I4892 Unspecified atrial flutter: Secondary | ICD-10-CM

## 2019-03-21 DIAGNOSIS — I10 Essential (primary) hypertension: Secondary | ICD-10-CM | POA: Diagnosis present

## 2019-03-21 DIAGNOSIS — Z85020 Personal history of malignant carcinoid tumor of stomach: Secondary | ICD-10-CM

## 2019-03-21 DIAGNOSIS — Z9981 Dependence on supplemental oxygen: Secondary | ICD-10-CM

## 2019-03-21 DIAGNOSIS — Z9049 Acquired absence of other specified parts of digestive tract: Secondary | ICD-10-CM

## 2019-03-21 DIAGNOSIS — R0602 Shortness of breath: Secondary | ICD-10-CM | POA: Diagnosis not present

## 2019-03-21 DIAGNOSIS — F17210 Nicotine dependence, cigarettes, uncomplicated: Secondary | ICD-10-CM | POA: Diagnosis present

## 2019-03-21 DIAGNOSIS — B349 Viral infection, unspecified: Secondary | ICD-10-CM | POA: Diagnosis present

## 2019-03-21 DIAGNOSIS — E785 Hyperlipidemia, unspecified: Secondary | ICD-10-CM | POA: Diagnosis present

## 2019-03-21 DIAGNOSIS — K5732 Diverticulitis of large intestine without perforation or abscess without bleeding: Secondary | ICD-10-CM | POA: Diagnosis present

## 2019-03-21 DIAGNOSIS — R0989 Other specified symptoms and signs involving the circulatory and respiratory systems: Secondary | ICD-10-CM

## 2019-03-21 DIAGNOSIS — Z833 Family history of diabetes mellitus: Secondary | ICD-10-CM

## 2019-03-21 DIAGNOSIS — Z20822 Contact with and (suspected) exposure to covid-19: Secondary | ICD-10-CM | POA: Diagnosis present

## 2019-03-21 DIAGNOSIS — R1011 Right upper quadrant pain: Secondary | ICD-10-CM

## 2019-03-21 DIAGNOSIS — J90 Pleural effusion, not elsewhere classified: Secondary | ICD-10-CM | POA: Diagnosis present

## 2019-03-21 DIAGNOSIS — R0902 Hypoxemia: Secondary | ICD-10-CM

## 2019-03-21 DIAGNOSIS — F17200 Nicotine dependence, unspecified, uncomplicated: Secondary | ICD-10-CM | POA: Diagnosis present

## 2019-03-21 NOTE — Telephone Encounter (Signed)
I would recommend evaluation this afternoon at Urgent Care.

## 2019-03-22 ENCOUNTER — Inpatient Hospital Stay (HOSPITAL_COMMUNITY): Payer: 59

## 2019-03-22 ENCOUNTER — Emergency Department (HOSPITAL_COMMUNITY): Payer: 59

## 2019-03-22 ENCOUNTER — Other Ambulatory Visit: Payer: Self-pay

## 2019-03-22 DIAGNOSIS — K5792 Diverticulitis of intestine, part unspecified, without perforation or abscess without bleeding: Secondary | ICD-10-CM | POA: Diagnosis not present

## 2019-03-22 DIAGNOSIS — Z9089 Acquired absence of other organs: Secondary | ICD-10-CM | POA: Diagnosis not present

## 2019-03-22 DIAGNOSIS — J9621 Acute and chronic respiratory failure with hypoxia: Secondary | ICD-10-CM | POA: Diagnosis present

## 2019-03-22 DIAGNOSIS — I10 Essential (primary) hypertension: Secondary | ICD-10-CM | POA: Diagnosis present

## 2019-03-22 DIAGNOSIS — A419 Sepsis, unspecified organism: Secondary | ICD-10-CM

## 2019-03-22 DIAGNOSIS — J90 Pleural effusion, not elsewhere classified: Secondary | ICD-10-CM | POA: Diagnosis present

## 2019-03-22 DIAGNOSIS — Z85020 Personal history of malignant carcinoid tumor of stomach: Secondary | ICD-10-CM | POA: Diagnosis not present

## 2019-03-22 DIAGNOSIS — J811 Chronic pulmonary edema: Secondary | ICD-10-CM | POA: Diagnosis present

## 2019-03-22 DIAGNOSIS — Z20822 Contact with and (suspected) exposure to covid-19: Secondary | ICD-10-CM | POA: Diagnosis present

## 2019-03-22 DIAGNOSIS — E785 Hyperlipidemia, unspecified: Secondary | ICD-10-CM | POA: Diagnosis present

## 2019-03-22 DIAGNOSIS — E0590 Thyrotoxicosis, unspecified without thyrotoxic crisis or storm: Secondary | ICD-10-CM | POA: Diagnosis present

## 2019-03-22 DIAGNOSIS — I471 Supraventricular tachycardia: Secondary | ICD-10-CM

## 2019-03-22 DIAGNOSIS — Z9981 Dependence on supplemental oxygen: Secondary | ICD-10-CM | POA: Diagnosis not present

## 2019-03-22 DIAGNOSIS — K5732 Diverticulitis of large intestine without perforation or abscess without bleeding: Secondary | ICD-10-CM | POA: Diagnosis present

## 2019-03-22 DIAGNOSIS — I4892 Unspecified atrial flutter: Secondary | ICD-10-CM | POA: Diagnosis present

## 2019-03-22 DIAGNOSIS — R918 Other nonspecific abnormal finding of lung field: Secondary | ICD-10-CM | POA: Diagnosis present

## 2019-03-22 DIAGNOSIS — E876 Hypokalemia: Secondary | ICD-10-CM | POA: Diagnosis present

## 2019-03-22 DIAGNOSIS — R7881 Bacteremia: Secondary | ICD-10-CM | POA: Diagnosis not present

## 2019-03-22 DIAGNOSIS — F17210 Nicotine dependence, cigarettes, uncomplicated: Secondary | ICD-10-CM | POA: Diagnosis present

## 2019-03-22 DIAGNOSIS — E1310 Other specified diabetes mellitus with ketoacidosis without coma: Secondary | ICD-10-CM | POA: Diagnosis not present

## 2019-03-22 DIAGNOSIS — E860 Dehydration: Secondary | ICD-10-CM | POA: Diagnosis present

## 2019-03-22 DIAGNOSIS — A4151 Sepsis due to Escherichia coli [E. coli]: Secondary | ICD-10-CM | POA: Diagnosis present

## 2019-03-22 DIAGNOSIS — I700 Atherosclerosis of aorta: Secondary | ICD-10-CM | POA: Diagnosis present

## 2019-03-22 DIAGNOSIS — E1110 Type 2 diabetes mellitus with ketoacidosis without coma: Secondary | ICD-10-CM | POA: Diagnosis present

## 2019-03-22 DIAGNOSIS — C3490 Malignant neoplasm of unspecified part of unspecified bronchus or lung: Secondary | ICD-10-CM | POA: Diagnosis present

## 2019-03-22 DIAGNOSIS — I4581 Long QT syndrome: Secondary | ICD-10-CM | POA: Diagnosis present

## 2019-03-22 DIAGNOSIS — B349 Viral infection, unspecified: Secondary | ICD-10-CM

## 2019-03-22 DIAGNOSIS — Z9049 Acquired absence of other specified parts of digestive tract: Secondary | ICD-10-CM | POA: Diagnosis not present

## 2019-03-22 DIAGNOSIS — F17200 Nicotine dependence, unspecified, uncomplicated: Secondary | ICD-10-CM

## 2019-03-22 DIAGNOSIS — R0602 Shortness of breath: Secondary | ICD-10-CM | POA: Diagnosis present

## 2019-03-22 DIAGNOSIS — R9431 Abnormal electrocardiogram [ECG] [EKG]: Secondary | ICD-10-CM

## 2019-03-22 DIAGNOSIS — R0902 Hypoxemia: Secondary | ICD-10-CM | POA: Diagnosis not present

## 2019-03-22 DIAGNOSIS — I483 Typical atrial flutter: Secondary | ICD-10-CM

## 2019-03-22 DIAGNOSIS — I4891 Unspecified atrial fibrillation: Secondary | ICD-10-CM | POA: Diagnosis present

## 2019-03-22 DIAGNOSIS — J449 Chronic obstructive pulmonary disease, unspecified: Secondary | ICD-10-CM | POA: Diagnosis present

## 2019-03-22 DIAGNOSIS — B9620 Unspecified Escherichia coli [E. coli] as the cause of diseases classified elsewhere: Secondary | ICD-10-CM | POA: Diagnosis not present

## 2019-03-22 HISTORY — DX: Sepsis, unspecified organism: A41.9

## 2019-03-22 LAB — CBC WITH DIFFERENTIAL/PLATELET
Abs Immature Granulocytes: 0.07 10*3/uL (ref 0.00–0.07)
Basophils Absolute: 0 10*3/uL (ref 0.0–0.1)
Basophils Relative: 0 %
Eosinophils Absolute: 0 10*3/uL (ref 0.0–0.5)
Eosinophils Relative: 0 %
HCT: 48.1 % — ABNORMAL HIGH (ref 36.0–46.0)
Hemoglobin: 16.8 g/dL — ABNORMAL HIGH (ref 12.0–15.0)
Immature Granulocytes: 1 %
Lymphocytes Relative: 8 %
Lymphs Abs: 1.1 10*3/uL (ref 0.7–4.0)
MCH: 31.1 pg (ref 26.0–34.0)
MCHC: 34.9 g/dL (ref 30.0–36.0)
MCV: 89.1 fL (ref 80.0–100.0)
Monocytes Absolute: 0.9 10*3/uL (ref 0.1–1.0)
Monocytes Relative: 7 %
Neutro Abs: 11.6 10*3/uL — ABNORMAL HIGH (ref 1.7–7.7)
Neutrophils Relative %: 84 %
Platelets: 158 10*3/uL (ref 150–400)
RBC: 5.4 MIL/uL — ABNORMAL HIGH (ref 3.87–5.11)
RDW: 12.8 % (ref 11.5–15.5)
WBC: 13.8 10*3/uL — ABNORMAL HIGH (ref 4.0–10.5)
nRBC: 0 % (ref 0.0–0.2)

## 2019-03-22 LAB — BASIC METABOLIC PANEL
Anion gap: 17 — ABNORMAL HIGH (ref 5–15)
Anion gap: 14 (ref 5–15)
Anion gap: 10 (ref 5–15)
BUN: 15 mg/dL (ref 8–23)
BUN: 11 mg/dL (ref 8–23)
BUN: 15 mg/dL (ref 8–23)
CO2: 17 mmol/L — ABNORMAL LOW (ref 22–32)
CO2: 16 mmol/L — ABNORMAL LOW (ref 22–32)
CO2: 17 mmol/L — ABNORMAL LOW (ref 22–32)
Calcium: 7.9 mg/dL — ABNORMAL LOW (ref 8.9–10.3)
Calcium: 7.9 mg/dL — ABNORMAL LOW (ref 8.9–10.3)
Calcium: 7.7 mg/dL — ABNORMAL LOW (ref 8.9–10.3)
Chloride: 98 mmol/L (ref 98–111)
Chloride: 102 mmol/L (ref 98–111)
Chloride: 105 mmol/L (ref 98–111)
Creatinine, Ser: 1.12 mg/dL — ABNORMAL HIGH (ref 0.44–1.00)
Creatinine, Ser: 0.85 mg/dL (ref 0.44–1.00)
Creatinine, Ser: 0.64 mg/dL (ref 0.44–1.00)
GFR calc Af Amer: >60 mL/min (ref >60)
GFR calc Af Amer: >60 mL/min (ref >60)
GFR calc Af Amer: >60 mL/min (ref >60)
GFR calc non Af Amer: 52 mL/min — ABNORMAL LOW (ref >60)
GFR calc non Af Amer: >60 mL/min (ref >60)
GFR calc non Af Amer: >60 mL/min (ref >60)
Glucose, Bld: 352 mg/dL — ABNORMAL HIGH (ref 70–99)
Glucose, Bld: 211 mg/dL — ABNORMAL HIGH (ref 70–99)
Glucose, Bld: 145 mg/dL — ABNORMAL HIGH (ref 70–99)
Potassium: 3.2 mmol/L — ABNORMAL LOW (ref 3.5–5.1)
Potassium: 3 mmol/L — ABNORMAL LOW (ref 3.5–5.1)
Potassium: 3.6 mmol/L (ref 3.5–5.1)
Sodium: 132 mmol/L — ABNORMAL LOW (ref 135–145)
Sodium: 132 mmol/L — ABNORMAL LOW (ref 135–145)
Sodium: 132 mmol/L — ABNORMAL LOW (ref 135–145)

## 2019-03-22 LAB — CBG MONITORING, ED
Glucose-Capillary: 330 mg/dL — ABNORMAL HIGH (ref 70–99)
Glucose-Capillary: 324 mg/dL — ABNORMAL HIGH (ref 70–99)
Glucose-Capillary: 274 mg/dL — ABNORMAL HIGH (ref 70–99)
Glucose-Capillary: 213 mg/dL — ABNORMAL HIGH (ref 70–99)
Glucose-Capillary: 227 mg/dL — ABNORMAL HIGH (ref 70–99)
Glucose-Capillary: 167 mg/dL — ABNORMAL HIGH (ref 70–99)
Glucose-Capillary: 180 mg/dL — ABNORMAL HIGH (ref 70–99)
Glucose-Capillary: 132 mg/dL — ABNORMAL HIGH (ref 70–99)
Glucose-Capillary: 148 mg/dL — ABNORMAL HIGH (ref 70–99)
Glucose-Capillary: 175 mg/dL — ABNORMAL HIGH (ref 70–99)

## 2019-03-22 LAB — RESPIRATORY PANEL BY PCR

## 2019-03-22 LAB — GLUCOSE, CAPILLARY
Glucose-Capillary: 171 mg/dL — ABNORMAL HIGH (ref 70–99)
Glucose-Capillary: 199 mg/dL — ABNORMAL HIGH (ref 70–99)
Glucose-Capillary: 224 mg/dL — ABNORMAL HIGH (ref 70–99)
Glucose-Capillary: 227 mg/dL — ABNORMAL HIGH (ref 70–99)

## 2019-03-22 LAB — PROCALCITONIN: Procalcitonin: 12.9 ng/mL

## 2019-03-22 LAB — POCT I-STAT EG7
Acid-base deficit: 8 mmol/L — ABNORMAL HIGH (ref 0.0–2.0)
Bicarbonate: 17.2 mmol/L — ABNORMAL LOW (ref 20.0–28.0)
Calcium, Ion: 1.05 mmol/L — ABNORMAL LOW (ref 1.15–1.40)
HCT: 47 % — ABNORMAL HIGH (ref 36.0–46.0)
Hemoglobin: 16 g/dL — ABNORMAL HIGH (ref 12.0–15.0)
O2 Saturation: 76 %
Potassium: 3.3 mmol/L — ABNORMAL LOW (ref 3.5–5.1)
Sodium: 133 mmol/L — ABNORMAL LOW (ref 135–145)
TCO2: 18 mmol/L — ABNORMAL LOW (ref 22–32)
pCO2, Ven: 33.1 mmHg — ABNORMAL LOW (ref 44.0–60.0)
pH, Ven: 7.323 (ref 7.250–7.430)
pO2, Ven: 43 mmHg (ref 32.0–45.0)

## 2019-03-22 LAB — RESPIRATORY PANEL BY RT PCR (FLU A&B, COVID)
Influenza A by PCR: NEGATIVE
Influenza B by PCR: NEGATIVE
SARS Coronavirus 2 by RT PCR: NEGATIVE

## 2019-03-22 LAB — D-DIMER, QUANTITATIVE: D-Dimer, Quant: 2.66 ug/mL-FEU — ABNORMAL HIGH (ref 0.00–0.50)

## 2019-03-22 LAB — BETA-HYDROXYBUTYRIC ACID
Beta-Hydroxybutyric Acid: 4.94 mmol/L — ABNORMAL HIGH (ref 0.05–0.27)
Beta-Hydroxybutyric Acid: 0.31 mmol/L — ABNORMAL HIGH (ref 0.05–0.27)

## 2019-03-22 LAB — LACTIC ACID, PLASMA
Lactic Acid, Venous: 1.6 mmol/L (ref 0.5–1.9)
Lactic Acid, Venous: 1 mmol/L (ref 0.5–1.9)

## 2019-03-22 LAB — URINALYSIS, ROUTINE W REFLEX MICROSCOPIC
Bacteria, UA: NONE SEEN
Bilirubin Urine: NEGATIVE
Glucose, UA: >=500 mg/dL — AB
Ketones, ur: 80 mg/dL — AB
Leukocytes,Ua: NEGATIVE
Nitrite: NEGATIVE
Protein, ur: NEGATIVE mg/dL
Specific Gravity, Urine: 1.018 (ref 1.005–1.030)
pH: 5 (ref 5.0–8.0)

## 2019-03-22 LAB — COMPREHENSIVE METABOLIC PANEL
ALT: 31 U/L (ref 0–44)
AST: 26 U/L (ref 15–41)
Albumin: 3.1 g/dL — ABNORMAL LOW (ref 3.5–5.0)
Alkaline Phosphatase: 185 U/L — ABNORMAL HIGH (ref 38–126)
Anion gap: 18 — ABNORMAL HIGH (ref 5–15)
BUN: 14 mg/dL (ref 8–23)
CO2: 17 mmol/L — ABNORMAL LOW (ref 22–32)
Calcium: 8.8 mg/dL — ABNORMAL LOW (ref 8.9–10.3)
Chloride: 94 mmol/L — ABNORMAL LOW (ref 98–111)
Creatinine, Ser: 1 mg/dL (ref 0.44–1.00)
GFR calc Af Amer: >60 mL/min (ref >60)
GFR calc non Af Amer: 60 mL/min — ABNORMAL LOW (ref >60)
Glucose, Bld: 413 mg/dL — ABNORMAL HIGH (ref 70–99)
Potassium: 3.4 mmol/L — ABNORMAL LOW (ref 3.5–5.1)
Sodium: 129 mmol/L — ABNORMAL LOW (ref 135–145)
Total Bilirubin: 1.8 mg/dL — ABNORMAL HIGH (ref 0.3–1.2)
Total Protein: 6.5 g/dL (ref 6.5–8.1)

## 2019-03-22 LAB — TSH: TSH: 0.132 u[IU]/mL — ABNORMAL LOW (ref 0.350–4.500)

## 2019-03-22 LAB — APTT: aPTT: 25 seconds (ref 24–36)

## 2019-03-22 LAB — PROTIME-INR
INR: 1 (ref 0.8–1.2)
Prothrombin Time: 13 seconds (ref 11.4–15.2)

## 2019-03-22 LAB — T4, FREE: Free T4: 1.13 ng/dL — ABNORMAL HIGH (ref 0.61–1.12)

## 2019-03-22 LAB — C-REACTIVE PROTEIN: CRP: 20.6 mg/dL — ABNORMAL HIGH (ref <1.0)

## 2019-03-22 LAB — POC SARS CORONAVIRUS 2 AG -  ED: SARS Coronavirus 2 Ag: NEGATIVE

## 2019-03-22 LAB — SEDIMENTATION RATE: Sed Rate: 37 mm/hr — ABNORMAL HIGH (ref 0–22)

## 2019-03-22 LAB — MAGNESIUM: Magnesium: 1.4 mg/dL — ABNORMAL LOW (ref 1.7–2.4)

## 2019-03-22 LAB — HIV ANTIBODY (ROUTINE TESTING W REFLEX): HIV Screen 4th Generation wRfx: NONREACTIVE

## 2019-03-22 LAB — LIPASE, BLOOD: Lipase: 18 U/L (ref 11–51)

## 2019-03-22 IMAGING — DX DG CHEST 1V PORT
1 series · 2 of 2 positions shown · non-contrast
Comparison: Chest radiograph dated [DATE]

CLINICAL DATA: 63-year-old female with shortness of breath.

EXAM:
PORTABLE CHEST 1 VIEW

[Series 1: chest · 0.14mm/px · 2 of 2 slices shown]
[im 1/2]
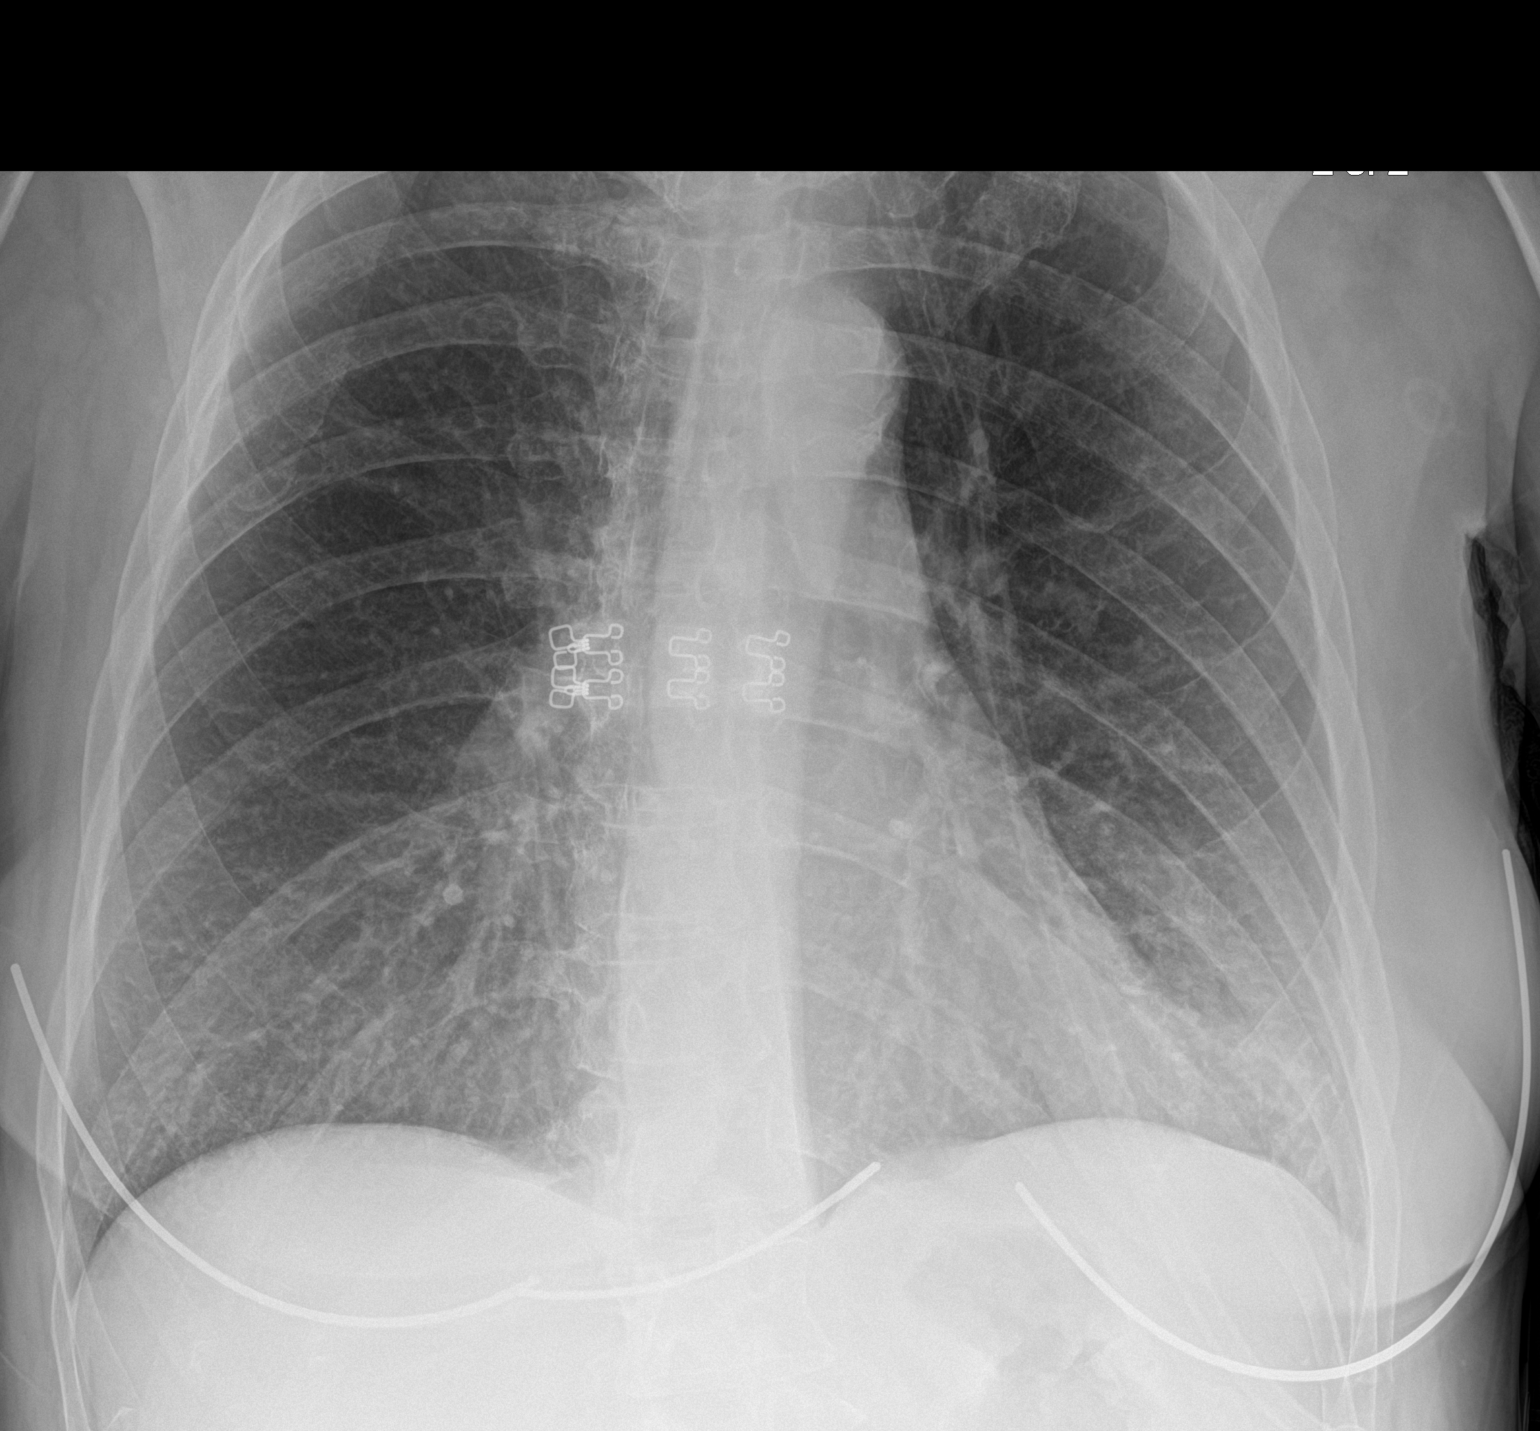
[im 2/2]
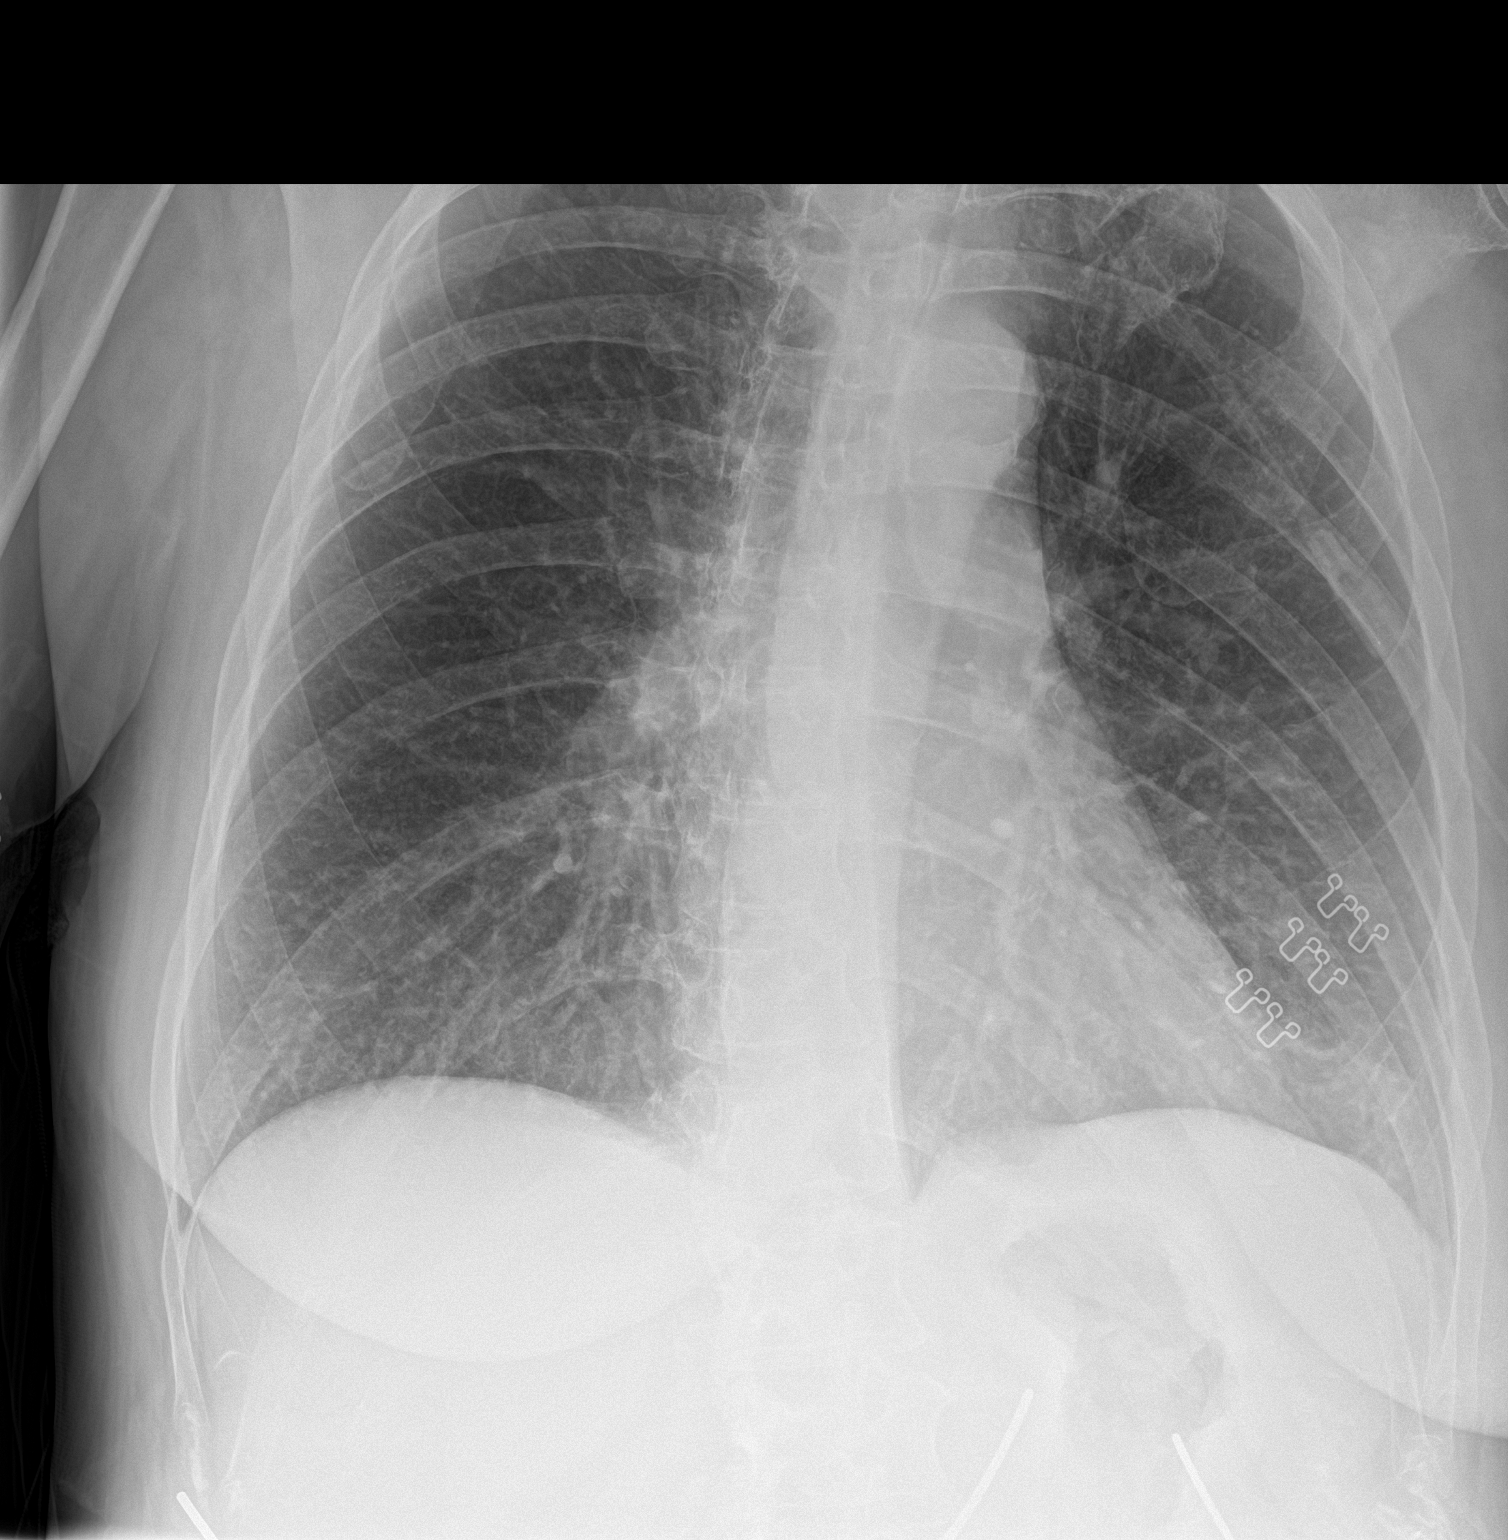

[2 of 2 positions shown; findings below may reference images not displayed]

FINDINGS: Minimal bibasilar interstitial nodularity may represent interstitial
prominence. Atypical infiltrate is not excluded. Clinical
correlation is recommended. No focal consolidation, pleural
effusion, or pneumothorax. The cardiac silhouette is within normal
limits. Coronary vascular calcification and atherosclerotic
calcification of the aortic arch. No acute osseous pathology.
IMPRESSION: 1. No focal consolidation.
2. Minimal bibasilar interstitial nodularity may represent
interstitial prominence or atypical infection. Clinical correlation
is recommended.

## 2019-03-22 IMAGING — CT CT ANGIO CHEST
2 of 6 series · 18 of 36 positions shown · IV contrast (omnipaque)
Comparison: None.

CLINICAL DATA: Shortness of breath and right lower lobe pain for 4
days

EXAM:
CT ANGIOGRAPHY CHEST WITH CONTRAST
TECHNIQUE: Multidetector CT imaging of the chest was performed using the
standard protocol during bolus administration of intravenous
contrast. Multiplanar CT image reconstructions and MIPs were
obtained to evaluate the vascular anatomy.
CONTRAST:  100mL OMNIPAQUE IOHEXOL 350 MG/ML SOLN

[Series 7: pe thins · axial · 0.76mm/px · z∈[+1085,+1344]mm · 17 of 412 slices shown]
[im 21/412  lung]
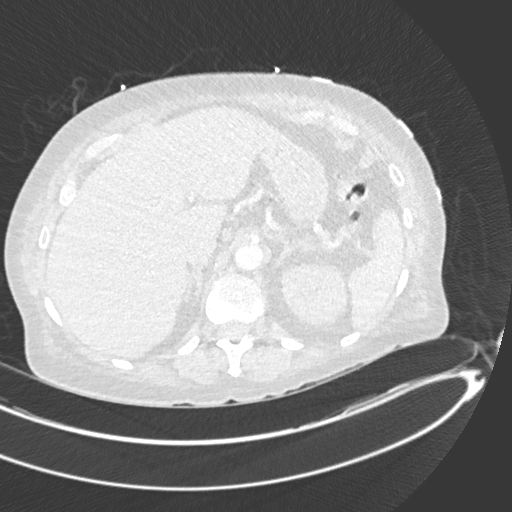
[im 42/412  mediastinal]
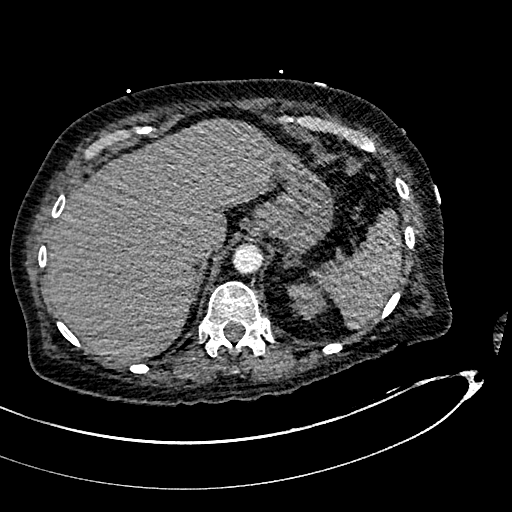
[im 62/412  lung]
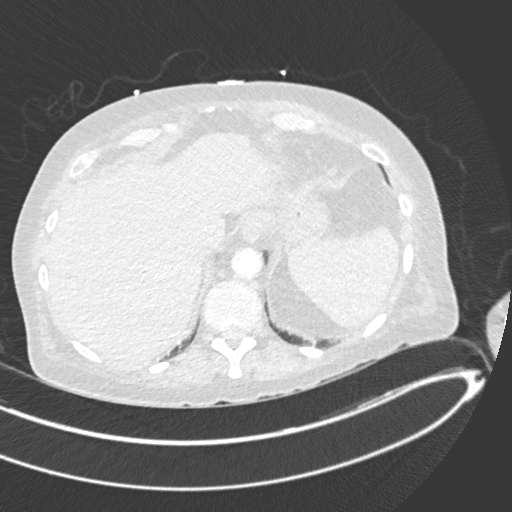
[im 83/412  mediastinal]
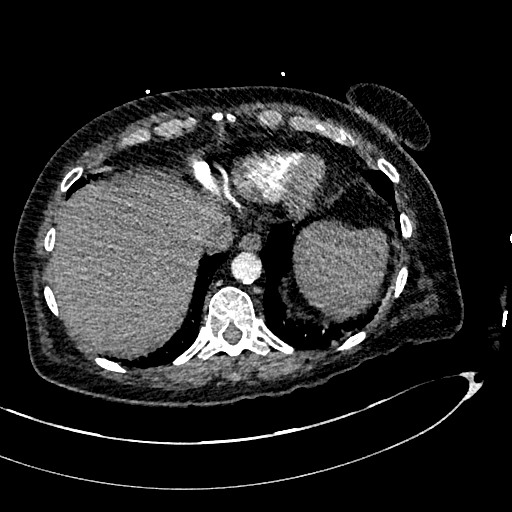
[im 124/412  lung]
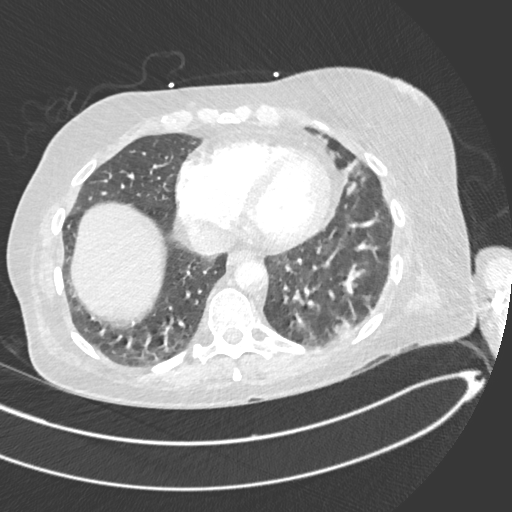
[im 144/412  mediastinal]
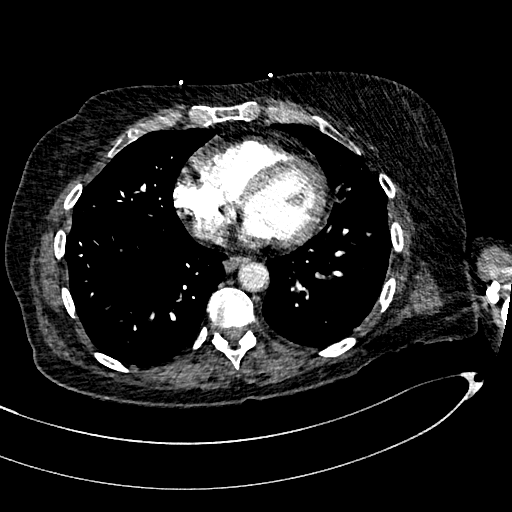
[im 165/412  lung]
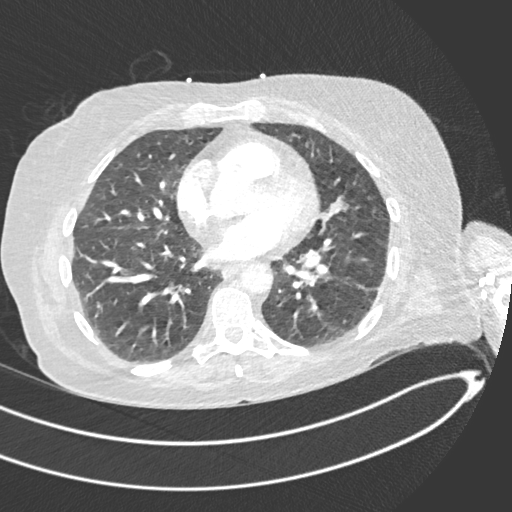
[im 185/412  mediastinal]
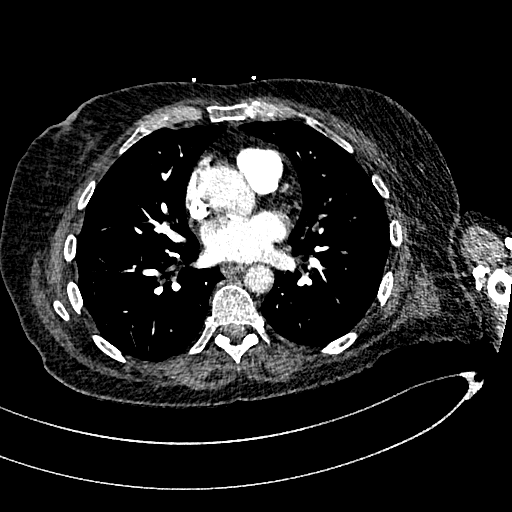
[im 206/412  lung]
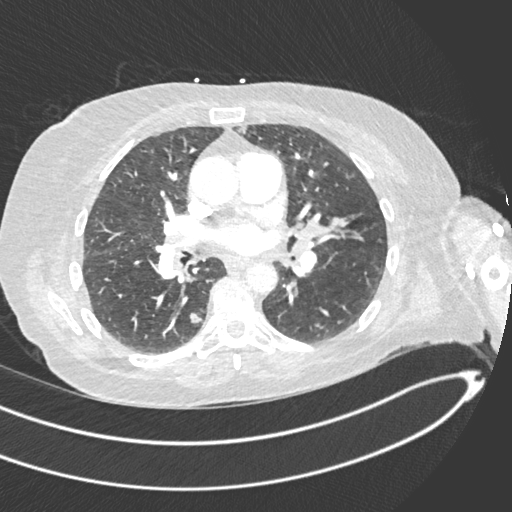
[im 227/412  mediastinal]
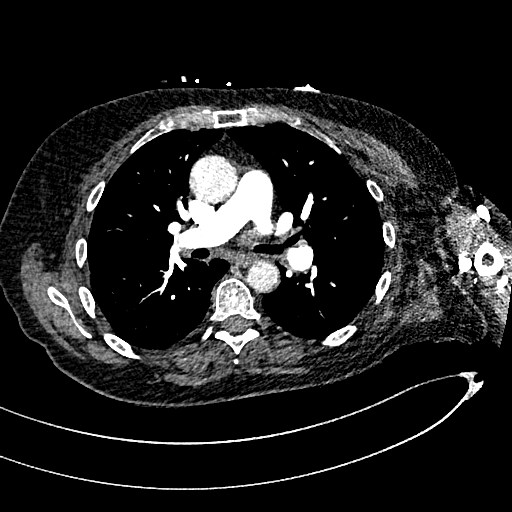
[im 247/412  lung]
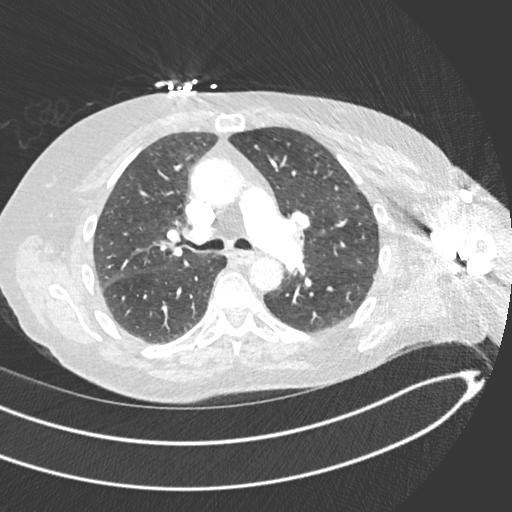
[im 268/412  mediastinal]
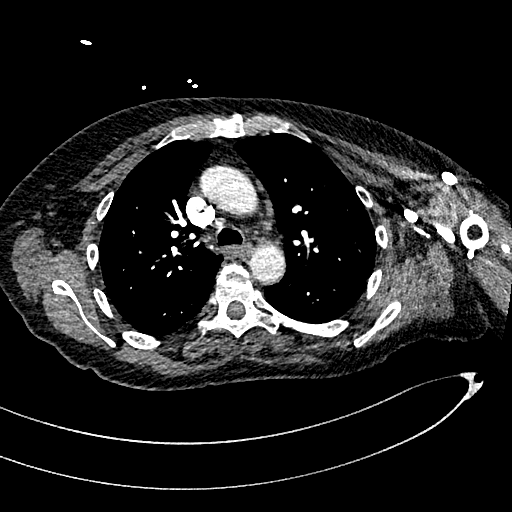
[im 288/412  lung]
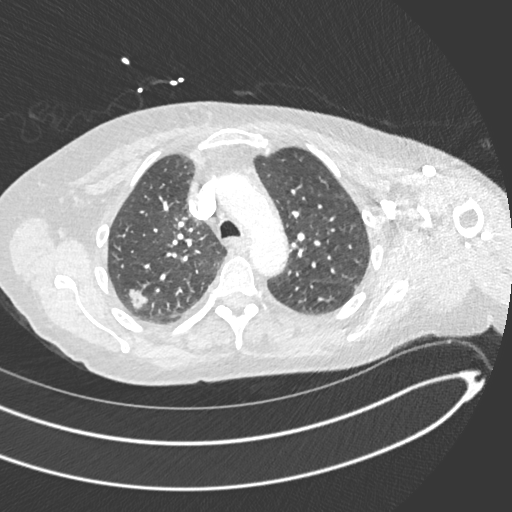
[im 329/412  mediastinal]
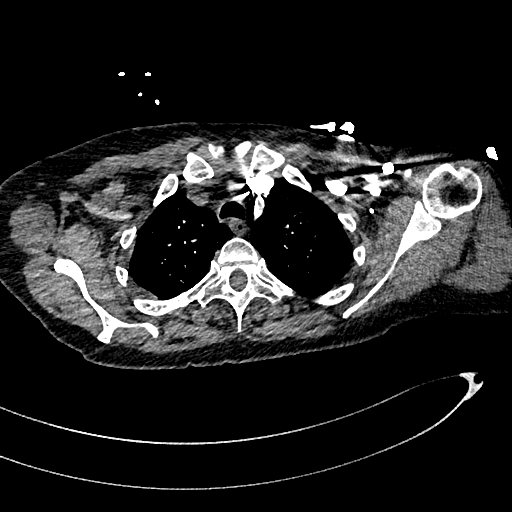
[im 350/412  lung]
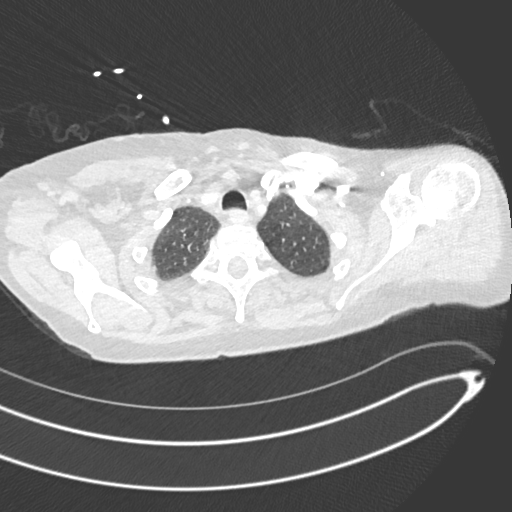
[im 370/412  mediastinal]
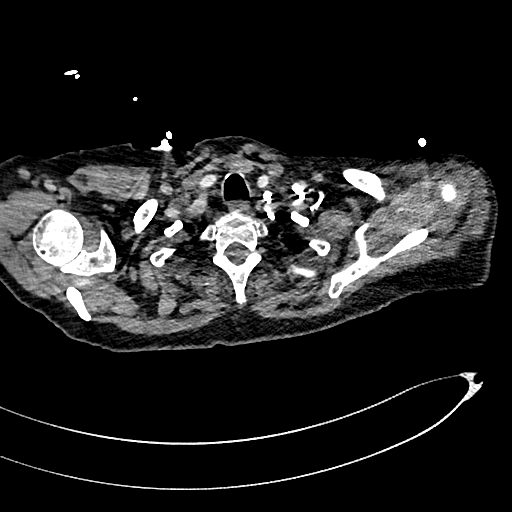
[im 391/412  lung]
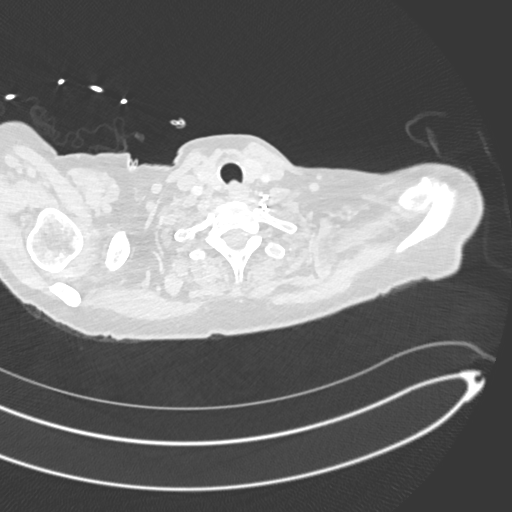

[Series 8: pe 2mm cor · coronal · 0.59mm/px · 1 of 151 slices shown]
[im 76/151  mediastinal]
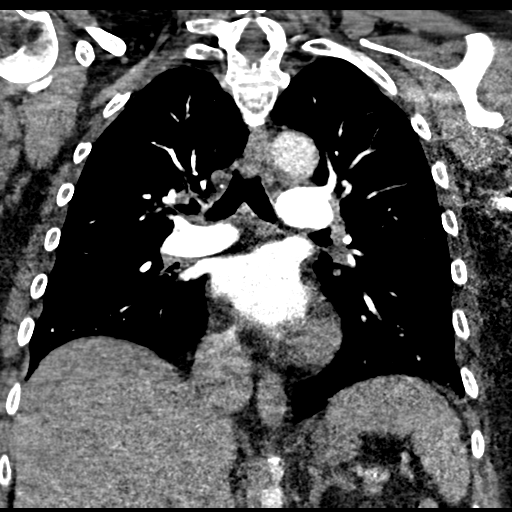

[18 of 36 positions shown; findings below may reference images not displayed]

FINDINGS: Cardiovascular:

--Pulmonary arteries: Contrast injection is sufficient to
demonstrate satisfactory opacification of the pulmonary arteries to
the segmental level, with attenuation of at least 200 HU at the main
pulmonary artery. There is no pulmonary embolus. The main pulmonary
artery is within normal limits for size.

--Aorta: Limited opacification of the aorta due to bolus timing
optimization for the pulmonary arteries. The course and caliber of
the aorta are normal. Conventional 3 vessel aortic branching
pattern. There is mild aortic atherosclerosis.

--Heart: Normal size. No pericardial effusion.

Mediastinum/Nodes: No mediastinal, hilar or axillary
lymphadenopathy. The visualized thyroid and thoracic esophageal
course are unremarkable.

Lungs/Pleura: Multiple nodular opacities in the right lung, largest
of which is in the posterior right upper lobe measures approximately
1.5 cm. There is a 2 cm left parahilar nodule. No pleural effusion.

Upper Abdomen: Contrast bolus timing is not optimized for evaluation
of the abdominal organs. The visualized portions of the organs of
the upper abdomen are normal.

Musculoskeletal: No chest wall abnormality. No bony spinal canal
stenosis.

Review of the MIP images confirms the above findings.
IMPRESSION: 1. No pulmonary embolus to the segmental level.
2. Multiple nodular opacities in both lungs, the largest of which
measures 2.0 cm. These are concerning for malignancy. Non-contrast
chest CT at 3-6 months is recommended. If the nodules are stable at
time of repeat CT, then future CT at 18-24 months (from today's
scan) is considered optional for low-risk patients, but is
recommended for high-risk patients. This recommendation follows the
consensus statement: Guidelines for Management of Incidental
Pulmonary Nodules Detected on CT Images: From the [HOSPITAL]
3. Aortic Atherosclerosis ([IY]-[IY]).

## 2019-03-22 MED ORDER — INSULIN REGULAR(HUMAN) IN NACL 100-0.9 UT/100ML-% IV SOLN
INTRAVENOUS | Status: DC
  Administered 2019-03-22: 12 [IU]/h via INTRAVENOUS
  Administered 2019-03-23: 6.5 [IU]/h via INTRAVENOUS
  Filled 2019-03-22 (×3): qty 100

## 2019-03-22 MED ORDER — SODIUM CHLORIDE 0.9 % IV BOLUS (SEPSIS)
2000.0000 mL | Freq: Once | INTRAVENOUS | Status: AC
  Administered 2019-03-22: 2000 mL via INTRAVENOUS

## 2019-03-22 MED ORDER — FENTANYL CITRATE (PF) 100 MCG/2ML IJ SOLN
100.0000 ug | Freq: Once | INTRAMUSCULAR | Status: AC
  Administered 2019-03-22: 100 ug via INTRAVENOUS
  Filled 2019-03-22: qty 2

## 2019-03-22 MED ORDER — SIMVASTATIN 20 MG PO TABS
10.0000 mg | ORAL_TABLET | Freq: Every day | ORAL | Status: DC
  Administered 2019-03-22 – 2019-03-27 (×6): 10 mg via ORAL
  Filled 2019-03-22 (×6): qty 1

## 2019-03-22 MED ORDER — METHIMAZOLE 5 MG PO TABS
15.0000 mg | ORAL_TABLET | Freq: Every day | ORAL | Status: DC
  Administered 2019-03-22 – 2019-03-28 (×7): 15 mg via ORAL
  Filled 2019-03-22 (×7): qty 1

## 2019-03-22 MED ORDER — ACETAMINOPHEN 650 MG RE SUPP: 650.0000 mg | Freq: Four times a day (QID) | RECTAL | Status: DC | PRN

## 2019-03-22 MED ORDER — ALBUTEROL SULFATE HFA 108 (90 BASE) MCG/ACT IN AERS: 2.0000 | INHALATION_SPRAY | Freq: Once | RESPIRATORY_TRACT | Status: DC

## 2019-03-22 MED ORDER — ALBUTEROL SULFATE (2.5 MG/3ML) 0.083% IN NEBU: 5.0000 mg | INHALATION_SOLUTION | Freq: Once | RESPIRATORY_TRACT | Status: DC

## 2019-03-22 MED ORDER — POTASSIUM CHLORIDE CRYS ER 20 MEQ PO TBCR
40.0000 meq | EXTENDED_RELEASE_TABLET | ORAL | Status: AC
  Administered 2019-03-22: 40 meq via ORAL
  Filled 2019-03-22: qty 2

## 2019-03-22 MED ORDER — DEXTROSE-NACL 5-0.45 % IV SOLN: INTRAVENOUS | Status: DC

## 2019-03-22 MED ORDER — POTASSIUM CHLORIDE 10 MEQ/100ML IV SOLN
10.0000 meq | INTRAVENOUS | Status: AC
  Administered 2019-03-22 (×2): 10 meq via INTRAVENOUS
  Filled 2019-03-22 (×2): qty 100

## 2019-03-22 MED ORDER — IOHEXOL 350 MG/ML SOLN
100.0000 mL | Freq: Once | INTRAVENOUS | Status: AC | PRN
  Administered 2019-03-22: 100 mL via INTRAVENOUS

## 2019-03-22 MED ORDER — ACETAMINOPHEN 325 MG PO TABS
650.0000 mg | ORAL_TABLET | Freq: Four times a day (QID) | ORAL | Status: DC | PRN
  Administered 2019-03-22 – 2019-03-25 (×4): 650 mg via ORAL
  Filled 2019-03-22 (×5): qty 2

## 2019-03-22 MED ORDER — CARVEDILOL 12.5 MG PO TABS
12.5000 mg | ORAL_TABLET | Freq: Two times a day (BID) | ORAL | Status: DC
  Administered 2019-03-22 – 2019-03-28 (×13): 12.5 mg via ORAL
  Filled 2019-03-22 (×13): qty 1

## 2019-03-22 MED ORDER — PERFLUTREN LIPID MICROSPHERE
1.0000 mL | INTRAVENOUS | Status: AC | PRN
  Administered 2019-03-22: 17:00:00 5 mL via INTRAVENOUS
  Filled 2019-03-22: qty 10

## 2019-03-22 MED ORDER — SODIUM CHLORIDE 0.9% FLUSH
3.0000 mL | Freq: Two times a day (BID) | INTRAVENOUS | Status: DC
  Administered 2019-03-23 – 2019-03-28 (×8): 3 mL via INTRAVENOUS

## 2019-03-22 MED ORDER — ALBUTEROL SULFATE (2.5 MG/3ML) 0.083% IN NEBU
2.5000 mg | INHALATION_SOLUTION | Freq: Four times a day (QID) | RESPIRATORY_TRACT | Status: DC | PRN
  Administered 2019-03-24: 2.5 mg via RESPIRATORY_TRACT

## 2019-03-22 MED ORDER — DILTIAZEM HCL-DEXTROSE 125-5 MG/125ML-% IV SOLN (PREMIX)
5.0000 mg/h | INTRAVENOUS | Status: DC
  Administered 2019-03-22: 5 mg/h via INTRAVENOUS
  Filled 2019-03-22: qty 125

## 2019-03-22 MED ORDER — DEXTROSE 50 % IV SOLN: 0.0000 mL | INTRAVENOUS | Status: DC | PRN

## 2019-03-22 MED ORDER — SODIUM CHLORIDE 0.9 % IV SOLN: INTRAVENOUS | Status: DC

## 2019-03-22 MED ORDER — SODIUM CHLORIDE 0.9 % IV BOLUS
250.0000 mL | Freq: Once | INTRAVENOUS | Status: AC
  Administered 2019-03-22: 250 mL via INTRAVENOUS

## 2019-03-22 MED ORDER — POTASSIUM CHLORIDE CRYS ER 20 MEQ PO TBCR
20.0000 meq | EXTENDED_RELEASE_TABLET | Freq: Every day | ORAL | Status: DC
  Administered 2019-03-23 – 2019-03-28 (×6): 20 meq via ORAL
  Filled 2019-03-22 (×6): qty 1

## 2019-03-22 MED ORDER — ENOXAPARIN SODIUM 40 MG/0.4ML ~~LOC~~ SOLN
40.0000 mg | SUBCUTANEOUS | Status: DC
  Administered 2019-03-22 – 2019-03-28 (×7): 40 mg via SUBCUTANEOUS
  Filled 2019-03-22 (×7): qty 0.4

## 2019-03-22 MED ORDER — FLECAINIDE ACETATE 50 MG PO TABS
50.0000 mg | ORAL_TABLET | Freq: Two times a day (BID) | ORAL | Status: DC
  Administered 2019-03-22 – 2019-03-28 (×13): 50 mg via ORAL
  Filled 2019-03-22 (×14): qty 1

## 2019-03-22 MED ORDER — ADENOSINE 6 MG/2ML IV SOLN
INTRAVENOUS | Status: AC
  Administered 2019-03-22: 6 mg
  Filled 2019-03-22: qty 2

## 2019-03-22 MED ORDER — AMLODIPINE BESYLATE 5 MG PO TABS: 10.0000 mg | ORAL_TABLET | Freq: Every day | ORAL | Status: DC

## 2019-03-22 NOTE — ED Triage Notes (Signed)
Per pt she has been having sob and right lower lobe pain for about 4 days. Pt said when she is lying down more SOB. Pt has had chills, and body aches. Pt said she has been fevers off and on. Placed her on 02 bc sats were 92%

## 2019-03-22 NOTE — ED Provider Notes (Signed)
Report given to Dr. Fuller Plan with [facility].  He will admit the patient.  He request add on respiratory viral panel    Ripley Fraise, MD 03/22/19 [phone]

## 2019-03-22 NOTE — ED Notes (Signed)
Dr., Tamala Julian notified of hypotension

## 2019-03-22 NOTE — ED Provider Notes (Signed)
[facility] EMERGENCY DEPARTMENT Provider Note   CSN: [id number] Arrival date & time: 03/21/19  2348     History Chief Complaint  Patient presents with  . Shortness of Breath    Christy Abbott is a 64 y.o. female.  The history is provided by the patient.  Shortness of Breath Severity:  Moderate Onset quality:  Gradual Duration:  3 days Timing:  Intermittent Progression:  Worsening Chronicity:  New Relieved by:  Rest Worsened by:  Activity Associated symptoms: chest pain, cough and vomiting   Associated symptoms: no fever   Patient with history of diabetes, atrial flutter presents with feeling unwell. She reports about 3 days ago she began vomiting diarrhea. She then began having cough, chills and body aches. She also reports feeling shortness of breath. She is now having pleuritic type chest pain in the right lower chest. No new abdominal pain.     Past Medical History:  Diagnosis Date  . Atrial flutter [facility])    s/p CTI by Dr Lovena Le 02/2013  . Chest pain   . Diabetes mellitus without complication ([place])   . 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 Hemochromatosis associated with compound heterozygous mutation in HFE gene ([place]) 12/10/2017  . Hypertension   . Hyperthyroidism 10/31/2014    Patient Active Problem List   Diagnosis Date Noted  . Hemochromatosis associated with compound heterozygous mutation in HFE gene ([place]) 12/10/2017  . Stress at home 04/10/2016  . Carpal tunnel syndrome of left wrist 04/10/2016  . Insomnia 10/18/2015  . H/O malignant neuroendocrine tumor 10/31/2014  . Hyperthyroidism 10/31/2014  . Hypokalemia 05/20/2014  . Tobacco use disorder 12/03/2013  . Diabetes type 2, controlled ([place]) 04/21/2013  . Atrial flutter ([place]) 02/24/2013  . Essential hypertension, benign 02/24/2013    Past Surgical History:  Procedure Laterality Date  . ABLATION  03-04-2013   CTI by Dr Lovena Le for atrial flutter  . ATRIAL  FLUTTER ABLATION N/A 03/04/2013   Procedure: ATRIAL FLUTTER ABLATION;  Surgeon: Evans Lance, MD;  Location: [facility] CATH LAB;  Service: Cardiovascular;  Laterality: N/A;  . CHOLECYSTECTOMY  1982  . ESOPHAGOGASTRODUODENOSCOPY N/A 05/20/2014   Procedure: ESOPHAGOGASTRODUODENOSCOPY (EGD);  Surgeon: Teena Irani, MD;  Location: [facility] ENDOSCOPY;  Service: Endoscopy;  Laterality: N/A;  . TONSILLECTOMY  1972 ?     OB History   No obstetric history on file.     Family History  Problem Relation Age of Onset  . Atrial fibrillation Mother   . Arrhythmia Mother   . Diabetes Mother   . Hodgkin's lymphoma Father     Social History   Tobacco Use  . Smoking status: Current Every Day Smoker    Packs/day: 1.00    Types: Cigarettes  . Smokeless tobacco: Never Used  Substance Use Topics  . Alcohol use: No    Alcohol/week: 0.0 standard drinks  . Drug use: No    Home Medications Prior to Admission medications   Medication Sig Start Date End Date Taking? Authorizing Provider  amLODipine (NORVASC) 10 MG tablet Take 1 tablet by mouth once daily 03/05/19   [name], NP  Bioflavonoid Products (ESTER C PO) Take 1 tablet by mouth daily.    [provider]  carvedilol (COREG) 12.5 MG tablet TAKE 1 TABLET BY MOUTH TWICE DAILY ( PATIENT MUST KEEP UPCOMING APPOINTMENT ) 04/25/18   Jettie Booze, MD  flecainide (TAMBOCOR) 50 MG tablet TAKE 1 TABLET BY MOUTH TWICE DAILY, MAY TAKE  AN ADDITIONAL 1 TABLET AS NEEDED. Please keep upcoming appt in January. Thank you 02/18/19   Jettie Booze, MD  furosemide (LASIX) 20 MG tablet Take 1 tablet (20 mg total) by mouth daily. 05/28/18   [name], NP  glimepiride (AMARYL) 4 MG tablet Take 0.5 tablets (2 mg total) by mouth daily with breakfast. TAKE 1 TABLET BY MOUTH ONCE DAILY BEFORE BREAKFAST 06/09/18   [name], NP  insulin NPH Human (HUMULIN N,NOVOLIN N) 100 UNIT/ML injection Inject 0.05 mLs (5 Units total) into the skin 2  (two) times daily before a meal. 06/09/18   [name], NP  Insulin Syringe-Needle U-100 25G X 5/8" 1 ML MISC Use as directed 06/09/18   [name], NP  Lancets 30G MISC Check sugar twice daily. 06/09/18   [name], NP  lisinopril-hydrochlorothiazide (PRINZIDE,ZESTORETIC) 20-25 MG tablet Take 1 tablet by mouth daily. 05/28/18   [name], NP  metFORMIN (GLUCOPHAGE) 500 MG tablet Take 2 tablets (1,000 mg total) by mouth 2 (two) times daily. 07/26/18   [name], NP  methimazole (TAPAZOLE) 10 MG tablet Take 1.5 tablets (15 mg total) by mouth daily. 05/28/18   [name], NP  Omega-3 Fatty Acids (FISH OIL) 1200 MG CAPS Take 1,200 mg by mouth daily.     [provider]  pioglitazone (ACTOS) 30 MG tablet Take 1 tablet (30 mg total) by mouth daily. 05/28/18   [name], NP  potassium chloride SA (KLOR-CON) 20 MEQ tablet TAKE 1  BY MOUTH ONCE DAILY 03/05/19   [name], NP  simvastatin (ZOCOR) 10 MG tablet TAKE 1 TABLET BY MOUTH AT BEDTIME 03/05/19   [name], NP    Allergies    Jardiance [empagliflozin] and Trazodone and nefazodone  Review of Systems   Review of Systems  Constitutional: Positive for chills. Negative for fever.  Respiratory: Positive for cough and shortness of breath.   Cardiovascular: Positive for chest pain.  Gastrointestinal: Positive for diarrhea and vomiting.  Musculoskeletal: Positive for myalgias.  All other systems reviewed and are negative.   Physical Exam Updated Vital Signs BP 138/77   Pulse (!) 101   Temp 98.4 F (36.9 C) (Oral)   Resp (!) 25   Ht 1.626 m (5\' 4" )   Wt 70.3 kg   SpO2 93%   BMI 26.61 kg/m   Physical Exam CONSTITUTIONAL: Well developed/well nourished HEAD: Normocephalic/atraumatic EYES: EOMI ENMT: Mucous membranes moist NECK: supple no meningeal signs SPINE/BACK:entire spine nontender CV: S1/S2 noted, no murmurs/rubs/gallops noted LUNGS: Mild  tachypnea, rhonchi noted Chest -no tenderness ABDOMEN: soft, nontender, no rebound or guarding, bowel sounds noted throughout abdomen, no RUQ tenderness, well healed cholecystectomy scar GU:no cva tenderness NEURO: Pt is awake/alert/appropriate, moves all extremitiesx4.  No facial droop.   EXTREMITIES: pulses normal/equal, full ROM SKIN: warm, color normal PSYCH: no abnormalities of mood noted, alert and oriented to situation  ED Results / Procedures / Treatments   Labs (all labs ordered are listed, but only abnormal results are displayed) Labs Reviewed  COMPREHENSIVE METABOLIC PANEL - Abnormal; Notable for the following components:      Result Value   Sodium 129 (*)    Potassium 3.4 (*)    Chloride 94 (*)    CO2 17 (*)    Glucose, Bld 413 (*)    Calcium 8.8 (*)    Albumin 3.1 (*)    Alkaline Phosphatase 185 (*)    Total Bilirubin 1.8 (*)    GFR calc non Af Amer 60 (*)  Anion gap 18 (*)    All other components within normal limits  CBC WITH DIFFERENTIAL/PLATELET - Abnormal; Notable for the following components:   WBC 13.8 (*)    RBC 5.40 (*)    Hemoglobin 16.8 (*)    HCT 48.1 (*)    Neutro Abs 11.6 (*)    All other components within normal limits  RESPIRATORY PANEL BY RT PCR (FLU A&B, COVID)  D-DIMER, QUANTITATIVE (NOT AT [facility])  LIPASE, BLOOD  POC SARS CORONAVIRUS 2 AG -  ED    EKG EKG Interpretation  Date/Time:  Friday March 21 2019 23:51:35 EST Ventricular Rate:  122 PR Interval:    QRS Duration: 72 QT Interval:  410 QTC Calculation: 584 R Axis:   69 Text Interpretation: ** Critical Test Result: Long QTc Sinus rhythm Nonspecific ST and T wave abnormality Abnormal ECG When compared with ECG of 05/19/2014, QT has lengthened Confirmed by Delora Fuel ([id number]) on 03/22/2019 12:04:42 AM   Radiology CT Angio Chest PE W and/or Wo Contrast  Result Date: 03/22/2019 CLINICAL DATA:  Shortness of breath and right lower lobe pain for 4 days EXAM: CT ANGIOGRAPHY CHEST WITH  CONTRAST TECHNIQUE: Multidetector CT imaging of the chest was performed using the standard protocol during bolus administration of intravenous contrast. Multiplanar CT image reconstructions and MIPs were obtained to evaluate the vascular anatomy. CONTRAST:  12mL OMNIPAQUE IOHEXOL 350 MG/ML SOLN COMPARISON:  None. FINDINGS: Cardiovascular: --Pulmonary arteries: Contrast injection is sufficient to demonstrate satisfactory opacification of the pulmonary arteries to the segmental level, with attenuation of at least 200 HU at the main pulmonary artery. There is no pulmonary embolus. The main pulmonary artery is within normal limits for size. --Aorta: Limited opacification of the aorta due to bolus timing optimization for the pulmonary arteries. The course and caliber of the aorta are normal. Conventional 3 vessel aortic branching pattern. There is mild aortic atherosclerosis. --Heart: Normal size. No pericardial effusion. Mediastinum/Nodes: No mediastinal, hilar or axillary lymphadenopathy. The visualized thyroid and thoracic esophageal course are unremarkable. Lungs/Pleura: Multiple nodular opacities in the right lung, largest of which is in the posterior right upper lobe measures approximately 1.5 cm. There is a 2 cm left parahilar nodule. No pleural effusion. Upper Abdomen: Contrast bolus timing is not optimized for evaluation of the abdominal organs. The visualized portions of the organs of the upper abdomen are normal. Musculoskeletal: No chest wall abnormality. No bony spinal canal stenosis. Review of the MIP images confirms the above findings. IMPRESSION: 1. No pulmonary embolus to the segmental level. 2. Multiple nodular opacities in both lungs, the largest of which measures 2.0 cm. These are concerning for malignancy. Non-contrast chest CT at 3-6 months is recommended. If the nodules are stable at time of repeat CT, then future CT at 18-24 months (from today's scan) is considered optional for low-risk patients,  but is recommended for high-risk patients. This recommendation follows the consensus statement: Guidelines for Management of Incidental Pulmonary Nodules Detected on CT Images: From the Fleischner Society 2017; Radiology 2017; 284:228-243. 3. Aortic Atherosclerosis (ICD10-I70.0). Electronically Signed   By: Ulyses Jarred M.D.   On: 03/22/2019 06:05   DG Chest Portable 1 View  Result Date: 03/22/2019 CLINICAL DATA:  64 year old female with shortness of breath. EXAM: PORTABLE CHEST 1 VIEW COMPARISON:  Chest radiograph dated 06/01/2014 FINDINGS: Minimal bibasilar interstitial nodularity may represent interstitial prominence. Atypical infiltrate is not excluded. Clinical correlation is recommended. No focal consolidation, pleural effusion, or pneumothorax. The cardiac silhouette is within normal limits.  Coronary vascular calcification and atherosclerotic calcification of the aortic arch. No acute osseous pathology. IMPRESSION: 1. No focal consolidation. 2. Minimal bibasilar interstitial nodularity may represent interstitial prominence or atypical infection. Clinical correlation is recommended. Electronically Signed   By: Anner Crete M.D.   On: 03/22/2019 00:49    Procedures .Critical Care Performed by: Ripley Fraise, MD Authorized by: Ripley Fraise, MD   Critical care provider statement:    Critical care time (minutes):  60   Critical care start time:  03/22/2019 4:28 AM   Critical care end time:  03/22/2019 5:28 AM   Critical care time was exclusive of:  Separately billable procedures and treating other patients   Critical care was necessary to treat or prevent imminent or life-threatening deterioration of the following conditions:  Endocrine crisis, dehydration and respiratory failure   Critical care was time spent personally by me on the following activities:  Ordering and review of radiographic studies, ordering and review of laboratory studies, pulse oximetry, re-evaluation of patient's  condition, evaluation of patient's response to treatment, examination of patient, discussions with consultants and ordering and performing treatments and interventions   I assumed direction of critical care for this patient from another provider in my specialty: no    .Cardioversion  Date/Time: 03/22/2019 7:28 AM Performed by: Ripley Fraise, MD Authorized by: Ripley Fraise, MD   Pre-procedure details:    Cardioversion basis:  Emergent   Rhythm:  Supraventricular tachycardia Patient sedated: No Attempt one:    Shock outcome:  Conversion to normal sinus rhythm Post-procedure details:    Patient status:  Awake   Patient tolerance of procedure:  Tolerated well, no immediate complications Comments:     Patient given adenosine 75m IV push without any complications         Medications Ordered in ED Medications  insulin regular, human (MYXREDLIN) 100 units/ 100 mL infusion (has no administration in time range)  0.9 %  sodium chloride infusion (has no administration in time range)  dextrose 5 %-0.45 % sodium chloride infusion (has no administration in time range)  dextrose 50 % solution 0-50 mL (has no administration in time range)  potassium chloride 10 mEq in 100 mL IVPB (has no administration in time range)  diltiazem (CARDIZEM) 125 mg in dextrose 5% 125 mL (1 mg/mL) infusion (has no administration in time range)  sodium chloride 0.9 % bolus 2,000 mL (0 mLs Intravenous Stopped 03/22/19 0658)  fentaNYL (SUBLIMAZE) injection 100 mcg (100 mcg Intravenous Given 03/22/19 0527)  iohexol (OMNIPAQUE) 350 MG/ML injection 100 mL (100 mLs Intravenous Contrast Given 03/22/19 0537)  adenosine (ADENOCARD) 6 MG/2ML injection (6 mg  Given by Other 03/22/19 2297)    ED Course  I have reviewed the triage vital signs and the nursing notes.  Pertinent labs & imaging results that were available during my care of the patient were reviewed by me and considered in my medical decision making (see chart for  details).    MDM Rules/Calculators/A&P                      4:28 AM Patient presents with viral type illness symptoms including cough, chills, body aches, vomiting diarrhea. She is now having pleuritic pain. COVID is high on the differential. We will also explore for potential PE She also is noted to have hyperglycemia, mild DKA suspect this is likely due to dehydration. She will be given 2 L of IV fluid and will be reassessed 7:28 AM Extensive work-up has  not revealed a cause of hypoxia she has had 2 negative  Covid testing, no signs of PE She continues to have right-sided pleuritic pain After 2 L of fluid, she still appears to be in mild DKA While awaiting admission, patient went into SVT  EKG Interpretation  Date/Time:  Saturday March 22 2019 07:00:46 EST Ventricular Rate:  204 PR Interval:    QRS Duration: 77 QT Interval:  236 QTC Calculation: 435 R Axis:   71 Text Interpretation: Supraventricular tachycardia Repolarization abnormality, prob rate related Abnormal ekg Confirmed by Ripley Fraise [phone]) on 03/22/2019 7:25:45 AM      Patient was given adenosine IV push, went into sinus rhythm Soon after conversion to sinus rhythm, her HR increased to 150 and appeared to be in atrial flutter   EKG Interpretation  Date/Time:  Saturday March 22 2019 07:15:27 EST Ventricular Rate:  154 PR Interval:    QRS Duration: 91 QT Interval:  286 QTC Calculation: 458 R Axis:   65 Text Interpretation: Sinus tachycardia with irregular rate vs. atrial flutter Probable anterior infarct, age indeterminate Confirmed by Ripley Fraise [id number]) on 03/22/2019 7:30:16 AM      Patient to be placed on a Cardizem drip for her atrial flutter.  Insulin will also be started due to mild DKA Also noted to have pulmonary nodules, therefore she will need CT chest in about 3 months.  Message was sent to her PCP via epic 7:46 AM Signed out to dr Roslynn Amble to call report to hospitalist  Christy Abbott was evaluated in Emergency Department on 03/22/2019 for the symptoms described in the history of present illness. She was evaluated in the context of the global COVID-19 pandemic, which necessitated consideration that the patient might be at risk for infection with the SARS-CoV-2 virus that causes COVID-19. Institutional protocols and algorithms that pertain to the evaluation of patients at risk for COVID-19 are in a state of rapid change based on information released by regulatory bodies including the CDC and federal and state organizations. These policies and algorithms were followed during the patient's care in the ED.  Final Clinical Impression(s) / ED Diagnoses Final diagnoses:  Shortness of breath  Diabetic ketoacidosis without coma associated with other specified diabetes mellitus ([place])  Hypoxia  Atrial flutter, unspecified type ([place])  SVT (supraventricular tachycardia) ([place])  Pulmonary nodules    Rx / DC Orders ED Discharge Orders    None       Ripley Fraise, MD 03/22/19 [phone]

## 2019-03-22 NOTE — H&P (Addendum)
History and Physical    Christy Abbott NTI:[id number] DOB: [birth year] DOA: 03/21/2019  Referring MD/NP/PA: Ripley Fraise, MD PCP: [name], NP  Patient coming from: Home  Chief Complaint: Right sided chest pain  I have personally briefly reviewed patient's old medical records in [place]   HPI: Christy Abbott is a 64 y.o. female with medical history significant of hypertension, DM type II, atrial flutter seen previously by Dr. Lovena Le, hyperthyroidism, and neuroendocrine tumor in 2016 treated at [facility].  She presents with complaints of terrible pain underneath the right side of her rib cage which started yesterday.  Over this week patient has had symptoms of some viral illness.  She experienced nausea, vomiting, and diarrhea 3 days ago.  Since that time those symptoms have resolved.  However, notes associated symptoms of no appetite with decreased intake over the last 2-3 days, body aches, and generalized weakness.  Patient reports that normally she smokes 1/2 packs of cigarettes per day on average but has not smoked in the last 2 days.  She does not normally require oxygen at home.  Denies having any significant fever,  ED Course: Upon admission to the emergency department patient was seen to be a stage she was febrile up to 101.68F, respirations 17-33, blood pressures 121/78-191/107, and O2 saturation maintained on room air.  Patient was seen to go into SVT with heart rates into the 200s requiring adenosine to be given.  Subsequently patient noted to be in atrial flutter.  Labs significant for WBC 13.8, hemoglobin 16.8, sodium 129-> 132, potassium 3.4->3.2, BUN 15, creatinine 1.12, glucose 413->352, anion gap 17, D-dimer 2.66.  Urinalysis positive for hemoglobin, ketones, and glucose.  CT angiogram of the chest did not show any signs of a PE,  but did note multiple nodular opacities.  Patient was given 2 L of normal saline IV fluids, insulin drip per protocol, 20 mEq of  potassium chloride, and started on Cardizem drip after found to be in atrial fibrillation.  Review of Systems  Constitutional: Positive for malaise/fatigue. Negative for fever.  HENT: Negative for nosebleeds.   Eyes: Negative for photophobia and pain.  Respiratory: Positive for cough and shortness of breath.   Cardiovascular: Positive for chest pain. Negative for palpitations and leg swelling.  Gastrointestinal: Positive for diarrhea, nausea and vomiting. Negative for abdominal pain.  Genitourinary: Negative for dysuria and hematuria.  Musculoskeletal: Positive for back pain and myalgias.  Skin: Negative for itching.  Neurological: Positive for weakness. Negative for focal weakness and loss of consciousness.  Endo/Heme/Allergies: Does not bruise/bleed easily.  Psychiatric/Behavioral: Positive for substance abuse. Negative for memory loss.    Past Medical History:  Diagnosis Date  . Atrial flutter [facility])    s/p CTI by Dr Lovena Le 02/2013  . Chest pain   . Diabetes mellitus without complication ([place])   . 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 Hemochromatosis associated with compound heterozygous mutation in HFE gene ([place]) 12/10/2017  . Hypertension   . Hyperthyroidism 10/31/2014    Past Surgical History:  Procedure Laterality Date  . ABLATION  03-04-2013   CTI by Dr Lovena Le for atrial flutter  . ATRIAL FLUTTER ABLATION N/A 03/04/2013   Procedure: ATRIAL FLUTTER ABLATION;  Surgeon: Evans Lance, MD;  Location: [facility] CATH LAB;  Service: Cardiovascular;  Laterality: N/A;  . CHOLECYSTECTOMY  1982  . ESOPHAGOGASTRODUODENOSCOPY N/A 05/20/2014   Procedure: ESOPHAGOGASTRODUODENOSCOPY (EGD);  Surgeon: Teena Irani, MD;  Location: [facility];  Service: Endoscopy;  Laterality: N/A;  . TONSILLECTOMY  1972 ?     reports that she has been smoking cigarettes. She has been smoking about 1.00 pack per day. She has never used smokeless tobacco. She reports that she does not drink  alcohol or use drugs.  Allergies  Allergen Reactions  . Jardiance [Empagliflozin]     Causes heart palpitations  . Trazodone And Nefazodone     "hyped me up, could not sleep"    Family History  Problem Relation Age of Onset  . Atrial fibrillation Mother   . Arrhythmia Mother   . Diabetes Mother   . Hodgkin's lymphoma Father     Prior to Admission medications   Medication Sig Start Date End Date Taking? Authorizing Provider  amLODipine (NORVASC) 10 MG tablet Take 1 tablet by mouth once daily 03/05/19   [name], NP  Bioflavonoid Products (ESTER C PO) Take 1 tablet by mouth daily.    [provider]  carvedilol (COREG) 12.5 MG tablet TAKE 1 TABLET BY MOUTH TWICE DAILY ( PATIENT MUST KEEP UPCOMING APPOINTMENT ) 04/25/18   Jettie Booze, MD  flecainide (TAMBOCOR) 50 MG tablet TAKE 1 TABLET BY MOUTH TWICE DAILY, MAY TAKE AN ADDITIONAL 1 TABLET AS NEEDED. Please keep upcoming appt in January. Thank you 02/18/19   Jettie Booze, MD  furosemide (LASIX) 20 MG tablet Take 1 tablet (20 mg total) by mouth daily. 05/28/18   [name], NP  glimepiride (AMARYL) 4 MG tablet Take 0.5 tablets (2 mg total) by mouth daily with breakfast. TAKE 1 TABLET BY MOUTH ONCE DAILY BEFORE BREAKFAST 06/09/18   [name], NP  insulin NPH Human (HUMULIN N,NOVOLIN N) 100 UNIT/ML injection Inject 0.05 mLs (5 Units total) into the skin 2 (two) times daily before a meal. 06/09/18   [name], NP  Insulin Syringe-Needle U-100 25G X 5/8" 1 ML MISC Use as directed 06/09/18   [name], NP  Lancets 30G MISC Check sugar twice daily. 06/09/18   [name], NP  lisinopril-hydrochlorothiazide (PRINZIDE,ZESTORETIC) 20-25 MG tablet Take 1 tablet by mouth daily. 05/28/18   [name], NP  metFORMIN (GLUCOPHAGE) 500 MG tablet Take 2 tablets (1,000 mg total) by mouth 2 (two) times daily. 07/26/18   [name], NP  methimazole (TAPAZOLE) 10 MG  tablet Take 1.5 tablets (15 mg total) by mouth daily. 05/28/18   [name], NP  Omega-3 Fatty Acids (FISH OIL) 1200 MG CAPS Take 1,200 mg by mouth daily.     [provider]  pioglitazone (ACTOS) 30 MG tablet Take 1 tablet (30 mg total) by mouth daily. 05/28/18   [name], NP  potassium chloride SA (KLOR-CON) 20 MEQ tablet TAKE 1  BY MOUTH ONCE DAILY 03/05/19   [name], NP  simvastatin (ZOCOR) 10 MG tablet TAKE 1 TABLET BY MOUTH AT BEDTIME 03/05/19   [name], NP    Physical Exam:  Constitutional: Older female who appears to be acutely ill in some mild respiratory Vitals:   03/22/19 0530 03/22/19 0545 03/22/19 0600 03/22/19 0615  BP: (!) 143/89 121/78 126/75 124/72  Pulse: (!) 109 (!) 105 (!) 102 (!) 102  Resp: (!) 25 19 18 19   Temp:      TempSrc:      SpO2: 94%     Weight:      Height:       Eyes: PERRL, lids and conjunctivae normal ENMT: Mucous membranes are dry. Posterior pharynx clear of any exudate or lesions.  Neck:  normal, supple, no masses, no thyromegaly Respiratory: Mildly tachypneic with decreased aeration, no wheezing, no crackles.  Patient currently on 2 L nasal cannula oxygen with O2 saturations maintained. Cardiovascular: Tachycardic, no murmurs / rubs / gallops. No extremity edema. 2+ pedal pulses. No carotid bruits.  Abdomen: no tenderness, no masses palpated. No hepatosplenomegaly. Bowel sounds positive.  Musculoskeletal: no clubbing / cyanosis. No joint deformity upper and lower extremities. Good ROM, no contractures. Normal muscle tone.  Skin: no rashes, lesions, ulcers. No induration Neurologic: CN 2-12 grossly intact. Sensation intact, DTR normal. Strength 5/5 in all 4.  Psychiatric: Normal judgment and insight. Alert and oriented x 3. Normal mood.     Labs on Admission: I have personally reviewed following labs and imaging studies  CBC: Recent Labs  Lab 03/22/19 0003 03/22/19 0638  WBC 13.8*  --     NEUTROABS 11.6*  --   HGB 16.8* 16.0*  HCT 48.1* 47.0*  MCV 89.1  --   PLT 158  --    Basic Metabolic Panel: Recent Labs  Lab 03/22/19 0003 03/22/19 0627 03/22/19 0638  NA 129* 132* 133*  K 3.4* 3.2* 3.3*  CL 94* 98  --   CO2 17* 17*  --   GLUCOSE 413* 352*  --   BUN 14 15  --   CREATININE 1.00 1.12*  --   CALCIUM 8.8* 7.9*  --    GFR: Estimated Creatinine Clearance: 49.4 mL/min (A) (by C-G formula based on SCr of 1.12 mg/dL (H)). Liver Function Tests: Recent Labs  Lab 03/22/19 0003  AST 26  ALT 31  ALKPHOS 185*  BILITOT 1.8*  PROT 6.5  ALBUMIN 3.1*   Recent Labs  Lab 03/22/19 0424  LIPASE 18   No results for input(s): AMMONIA in the last 168 hours. Coagulation Profile: No results for input(s): INR, PROTIME in the last 168 hours. Cardiac Enzymes: No results for input(s): CKTOTAL, CKMB, CKMBINDEX, TROPONINI in the last 168 hours. BNP (last 3 results) No results for input(s): PROBNP in the last 8760 hours. HbA1C: No results for input(s): HGBA1C in the last 72 hours. CBG: Recent Labs  Lab 03/22/19 0646 03/22/19 0740  GLUCAP 330* 324*   Lipid Profile: No results for input(s): CHOL, HDL, LDLCALC, TRIG, CHOLHDL, LDLDIRECT in the last 72 hours. Thyroid Function Tests: No results for input(s): TSH, T4TOTAL, FREET4, T3FREE, THYROIDAB in the last 72 hours. Anemia Panel: No results for input(s): VITAMINB12, FOLATE, FERRITIN, TIBC, IRON, RETICCTPCT in the last 72 hours. Urine analysis:    Component Value Date/Time   BILIRUBINUR neg 09/06/2017 1138   PROTEINUR Negative 09/06/2017 1138   UROBILINOGEN 0.2 09/06/2017 1138   NITRITE neg 09/06/2017 1138   LEUKOCYTESUR Negative 09/06/2017 1138   Sepsis Labs: Recent Results (from the past 240 hour(s))  Respiratory Panel by RT PCR (Flu A&B, Covid) - Nasopharyngeal Swab     Status: None   Collection Time: 03/22/19  4:24 AM   Specimen: Nasopharyngeal Swab  Result Value Ref Range Status   SARS Coronavirus 2 by RT  PCR NEGATIVE NEGATIVE Final    Comment: (NOTE) SARS-CoV-2 target nucleic acids are NOT DETECTED. The SARS-CoV-2 RNA is generally detectable in upper respiratoy specimens during the acute phase of infection. The lowest concentration of SARS-CoV-2 viral copies this assay can detect is 131 copies/mL. A negative result does not preclude SARS-Cov-2 infection and should not be used as the sole basis for treatment or other patient management decisions. A negative result may occur with  improper  specimen collection/handling, submission of specimen other than nasopharyngeal swab, presence of viral mutation(s) within the areas targeted by this assay, and inadequate number of viral copies (<131 copies/mL). A negative result must be combined with clinical observations, patient history, and epidemiological information. The expected result is Negative. Fact Sheet for Patients:  PinkCheek.be Fact Sheet for Healthcare Providers:  GravelBags.it This test is not yet ap proved or cleared by the [place] FDA and  has been authorized for detection and/or diagnosis of SARS-CoV-2 by FDA under an Emergency Use Authorization (EUA). This EUA will remain  in effect (meaning this test can be used) for the duration of the COVID-19 declaration under Section 564(b)(1) of the Act, 21 U.S.C. section 360bbb-3(b)(1), unless the authorization is terminated or revoked sooner.    Influenza A by PCR NEGATIVE NEGATIVE Final   Influenza B by PCR NEGATIVE NEGATIVE Final    Comment: (NOTE) The Xpert Xpress SARS-CoV-2/FLU/RSV assay is intended as an aid in  the diagnosis of influenza from Nasopharyngeal swab specimens and  should not be used as a sole basis for treatment. Nasal washings and  aspirates are unacceptable for Xpert Xpress SARS-CoV-2/FLU/RSV  testing. Fact Sheet for Patients: PinkCheek.be Fact Sheet for Healthcare  Providers: GravelBags.it This test is not yet approved or cleared by the [place] FDA and  has been authorized for detection and/or diagnosis of SARS-CoV-2 by  FDA under an Emergency Use Authorization (EUA). This EUA will remain  in effect (meaning this test can be used) for the duration of the  Covid-19 declaration under Section 564(b)(1) of the Act, 21  U.S.C. section 360bbb-3(b)(1), unless the authorization is  terminated or revoked. Performed at [facility], [place] [address], [place], [place] [address]      Radiological Exams on Admission: CT Angio Chest PE W and/or Wo Contrast  Result Date: 03/22/2019 CLINICAL DATA:  Shortness of breath and right lower lobe pain for 4 days EXAM: CT ANGIOGRAPHY CHEST WITH CONTRAST TECHNIQUE: Multidetector CT imaging of the chest was performed using the standard protocol during bolus administration of intravenous contrast. Multiplanar CT image reconstructions and MIPs were obtained to evaluate the vascular anatomy. CONTRAST:  141m OMNIPAQUE IOHEXOL 350 MG/ML SOLN COMPARISON:  None. FINDINGS: Cardiovascular: --Pulmonary arteries: Contrast injection is sufficient to demonstrate satisfactory opacification of the pulmonary arteries to the segmental level, with attenuation of at least 200 HU at the main pulmonary artery. There is no pulmonary embolus. The main pulmonary artery is within normal limits for size. --Aorta: Limited opacification of the aorta due to bolus timing optimization for the pulmonary arteries. The course and caliber of the aorta are normal. Conventional 3 vessel aortic branching pattern. There is mild aortic atherosclerosis. --Heart: Normal size. No pericardial effusion. Mediastinum/Nodes: No mediastinal, hilar or axillary lymphadenopathy. The visualized thyroid and thoracic esophageal course are unremarkable. Lungs/Pleura: Multiple nodular opacities in the right lung, largest of which is in the posterior right  upper lobe measures approximately 1.5 cm. There is a 2 cm left parahilar nodule. No pleural effusion. Upper Abdomen: Contrast bolus timing is not optimized for evaluation of the abdominal organs. The visualized portions of the organs of the upper abdomen are normal. Musculoskeletal: No chest wall abnormality. No bony spinal canal stenosis. Review of the MIP images confirms the above findings. IMPRESSION: 1. No pulmonary embolus to the segmental level. 2. Multiple nodular opacities in both lungs, the largest of which measures 2.0 cm. These are concerning for malignancy. Non-contrast chest CT at 3-6 months is  recommended. If the nodules are stable at time of repeat CT, then future CT at 18-24 months (from today's scan) is considered optional for low-risk patients, but is recommended for high-risk patients. This recommendation follows the consensus statement: Guidelines for Management of Incidental Pulmonary Nodules Detected on CT Images: From the Fleischner Society 2017; Radiology 2017; 284:228-243. 3. Aortic Atherosclerosis (ICD10-I70.0). Electronically Signed   By: Ulyses Jarred M.D.   On: 03/22/2019 06:05   DG Chest Portable 1 View  Result Date: 03/22/2019 CLINICAL DATA:  64 year old female with shortness of breath. EXAM: PORTABLE CHEST 1 VIEW COMPARISON:  Chest radiograph dated 06/01/2014 FINDINGS: Minimal bibasilar interstitial nodularity may represent interstitial prominence. Atypical infiltrate is not excluded. Clinical correlation is recommended. No focal consolidation, pleural effusion, or pneumothorax. The cardiac silhouette is within normal limits. Coronary vascular calcification and atherosclerotic calcification of the aortic arch. No acute osseous pathology. IMPRESSION: 1. No focal consolidation. 2. Minimal bibasilar interstitial nodularity may represent interstitial prominence or atypical infection. Clinical correlation is recommended. Electronically Signed   By: Anner Crete M.D.   On: 03/22/2019  00:49    EKG: Independently reviewed.  SVT at 204 bpm  Assessment/Plan Sepsis secondary to viral illness: Acute.  Patient noted to be febrile up to 101.3 F rectally with tachycardia and tachypnea. She complains of viral-like illness, but COVID-19 screening influenza screening negative.  WBC elevated at 13.8.  Chest x-ray showed no focal consolidation. -Admit to a progressive bed -Check blood cultures -Check lactic acid level and procalcitonin -Follow-up respiratory virus panel  SVT and atrial flutter/atrial fibrillation: Acute.  Patient was seen to go into SVT requiring adenosine to be given with return to normal sinus rhythm, but subsequently went into atrial fibrillation. -Continue diltiazem drip for now and titrate it off when able -Continue flecainide and Coreg -Goal potassium 4 and magnesium 2.  Continue to check levels and replace as needed. -Check echocardiogram -Cardiology consulted, we will follow-up for further recommendation  DKA, type II patient's initial blood glucose elevated up to 413 with CO2 17, and anion gap 18.  Urinalysis was positive for ketones and glucose. -Hypoglycemic protocol -Hold Amaryl and Metformin -BMPs every 4 hours doctoring for resolution of anion gap -Transition to subcu insulin when able  Hyperthyroidism -Check TSH, free T4, and free T3 -Continue methimazole  Elevated D-dimer: Acute.  CT angiogram of the chest was negative for any signs of a pulmonary embolus.  Hypokalemia: Acute.  Potassium noted to be as low as 3.2 on admission.  Patient was ordered 20 mEq potassium chloride IV. -Give additional potassium chloride IV 40 mEq p.o -Continue home potassium replacement dose of 20 mEq daily on 1/10 -Continue to monitor and adjust regimen as needed  Essential hypertension -Held amlodipine while long Cardizem drip -Continue flecainide and Coreg  Pulmonary nodules: Patient noted to have multiple pulmonary nodular opacities on CT angiogram of the  chest.  Question if tumor is related to patient's previous neuroendocrine tumor history. 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  Patient was worked up and treated at [facility]. -May warrant further investigation  Tobacco abuse -Nicotine patch offered  DVT prophylaxis: lovenox Code Status: full   Family Communication: no family present at bedside  Disposition Plan: To be determined Consults called: Cardiology  Admission status: inpatient   [place]  MD [facility] Pager [phone]   If 7PM-7AM, please contact night-coverage [email or web address] Password [facility]  03/22/2019, 7:48 AM

## 2019-03-22 NOTE — ED Notes (Signed)
Patient transported to CT 

## 2019-03-22 NOTE — Progress Notes (Signed)
  Echocardiogram 2D Echocardiogram has been performed with Definity.  Christy Abbott 03/22/2019, 5:08 PM

## 2019-03-22 NOTE — ED Notes (Signed)
Attempted report 

## 2019-03-22 NOTE — ED Notes (Signed)
Pt husband would like a update on pt Christy Abbott [phone]

## 2019-03-22 NOTE — Consult Note (Addendum)
CARDIOLOGY CONSULT NOTE  [name] MRN: [id number] DOB/AGE: 64/64/1957 64 y.o.  Admit date: 03/21/2019 Primary Physician [name], NP Primary Cardiologist Larae Grooms, MD    Chief Complaint  Palpitations.     Requesting  Dr. Fuller Plan, MD  HPI:   She presented with pain in the chest on the right side.  CT did not demonstrate a PE.   In the ED she was found to have fever.  She is being admitted and treated for sepsis.  She also is being managed for DKA.    She was hypertensive initially.    She has previously been seen by Dr. Lovena Le and Dr. Irish Lack and has had atrial flutter.  He had a negative POET (Plain [employer] Exercise Treadmill) in 2019.  She is treated with flecainide and Coreg. On presentation she was in NSR.  However she developed atrial flutter with 2:1 conduction.   She was started on IV Cardizem.   She started feeling bad a couple of days ago.  She had some chills and weakness.  She had nausea and diarrhea.  She did have some sharp intermittent pains somewhat with deep inspiration on the right lower ribs.  She has not noticed recent tachypalpitations.   She has not had symptomatic flutter/or fib recently.    Past Medical History:  Diagnosis Date  . Atrial flutter [facility])    s/p CTI by Dr Lovena Le 02/2013  . Chest pain   . Diabetes mellitus without complication ([place])   . 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 Hemochromatosis associated with compound heterozygous mutation in HFE gene ([place]) 12/10/2017  . Hypertension   . Hyperthyroidism 10/31/2014    Past Surgical History:  Procedure Laterality Date  . ABLATION  03-04-2013   CTI by Dr Lovena Le for atrial flutter  . ATRIAL FLUTTER ABLATION N/A 03/04/2013   Procedure: ATRIAL FLUTTER ABLATION;  Surgeon: Evans Lance, MD;  Location: [facility] CATH LAB;  Service: Cardiovascular;  Laterality: N/A;  . CHOLECYSTECTOMY  1982  . ESOPHAGOGASTRODUODENOSCOPY N/A 05/20/2014   Procedure:  ESOPHAGOGASTRODUODENOSCOPY (EGD);  Surgeon: Teena Irani, MD;  Location: [facility];  Service: Endoscopy;  Laterality: N/A;  . TONSILLECTOMY  1972 ?    Allergies  Allergen Reactions  . Jardiance [Empagliflozin]     Causes heart palpitations  . Trazodone And Nefazodone     "hyped me up, could not sleep"   (Not in a hospital admission)  Family History  Problem Relation Age of Onset  . Atrial fibrillation Mother   . Arrhythmia Mother   . Diabetes Mother   . Hodgkin's lymphoma Father     Social History   Socioeconomic History  . Marital status: Married    Spouse name: Not on file  . Number of children: Not on file  . Years of education: Not on file  . Highest education level: Not on file  Occupational History  . Not on file  Tobacco Use  . Smoking status: Current Every Day Smoker    Packs/day: 1.00    Types: Cigarettes  . Smokeless tobacco: Never Used  Substance and Sexual Activity  . Alcohol use: No    Alcohol/week: 0.0 standard drinks  . Drug use: No  . Sexual activity: Not on file  Other Topics Concern  . Not on file  Social History Narrative   Married   Clinical cytogeneticist- full time   Daughter- 2 grandchildren, live in [place]   Enjoys playing on computer.  Social Determinants of Health   Financial Resource Strain:   . Difficulty of Paying Living Expenses: Not on file  Food Insecurity:   . Worried About Charity fundraiser in the Last Year: Not on file  . Ran Out of Food in the Last Year: Not on file  Transportation Needs:   . Lack of Transportation (Medical): Not on file  . Lack of Transportation (Non-Medical): Not on file  Physical Activity:   . Days of Exercise per Week: Not on file  . Minutes of Exercise per Session: Not on file  Stress:   . Feeling of Stress : Not on file  Social Connections:   . Frequency of Communication with Friends and Family: Not on file  . Frequency of Social Gatherings with Friends and Family: Not on file  . Attends Religious  Services: Not on file  . Active Member of Clubs or Organizations: Not on file  . Attends Archivist Meetings: Not on file  . Marital Status: Not on file  Intimate Partner Violence:   . Fear of Current or Ex-Partner: Not on file  . Emotionally Abused: Not on file  . Physically Abused: Not on file  . Sexually Abused: Not on file     ROS:    As stated in the HPI and negative for all other systems.  Physical Exam: Blood pressure (!) 85/59, pulse 86, temperature 99.8 F (37.7 C), temperature source Oral, resp. rate (!) 26, height 5\' 4"  (1.626 m), weight 70.3 kg, SpO2 94 %.  GENERAL:    She looks uncomfortable.   HEENT:  Pupils equal round and reactive, fundi not visualized, oral mucosa unremarkable NECK:  No jugular venous distention, waveform within normal limits, carotid upstroke brisk and symmetric, no bruits, no thyromegaly LYMPHATICS:  No cervical, inguinal adenopathy LUNGS:  Scattered wheezing and course crackles.  BACK:  No CVA tenderness CHEST:  Unremarkable HEART:  PMI not displaced or sustained,S1 and S2 within normal limits, no S3, no S4, no clicks, no rubs, no murmurs ABD:  Flat, positive bowel sounds normal in frequency in pitch, no bruits, no rebound, no guarding, no midline pulsatile mass, no hepatomegaly, no splenomegaly EXT:  2 plus pulses throughout, no edema, no cyanosis no clubbing SKIN:  No rashes no nodules NEURO:  Cranial nerves II through XII grossly intact, motor grossly intact throughout PSYCH:  Cognitively intact, oriented to person place and time   Labs: Lab Results  Component Value Date   BUN 11 03/22/2019   Lab Results  Component Value Date   CREATININE 0.85 03/22/2019   Lab Results  Component Value Date   NA 132 (L) 03/22/2019   K 3.0 (L) 03/22/2019   CL 102 03/22/2019   CO2 16 (L) 03/22/2019   Lab Results  Component Value Date   TROPONINI <0.03 05/19/2014   Lab Results  Component Value Date   WBC 13.8 (H) 03/22/2019   HGB 16.0  (H) 03/22/2019   HCT 47.0 (H) 03/22/2019   MCV 89.1 03/22/2019   PLT 158 03/22/2019   Lab Results  Component Value Date   CHOL 149 02/21/2018   HDL 40 02/21/2018   LDLCALC 46 02/21/2018   TRIG 315 (H) 02/21/2018   CHOLHDL 3.7 02/21/2018   Lab Results  Component Value Date   ALT 31 03/22/2019   AST 26 03/22/2019   ALKPHOS 185 (H) 03/22/2019   BILITOT 1.8 (H) 03/22/2019   ECHO:  Pending   Radiology:   CHEST CT:  1. No pulmonary embolus to the segmental level. 2. Multiple nodular opacities in both lungs, the largest of which measures 2.0 cm. These are concerning for malignancy. Non-contrast chest CT at 3-6 months is recommended. If the nodules are stable at time of repeat CT, then future CT at 18-24 months (from today's scan) is considered optional for low-risk patients, but is recommended for high-risk patients. This recommendation follows the consensus statement: Guidelines for Management of Incidental Pulmonary Nodules Detected on CT Images: From the Fleischner Society 2017; Radiology 2017; 284:228-243. 3. Aortic Atherosclerosis (ICD10-I70.0).  EKG:  Atrial flutter with 2:1 conduction.   Axis WNL intervals WNL.     ASSESSMENT AND PLAN:   TACHYCARDIA:   Atrial flutter with 2:1 conduction.   She converted to NSR with IV Dilt and became hypotensive.  She converted to NSR.  OK to contnue Coreg and flecainide.   She has not been on anticoagulation as this rhythm was controlled on flecainide.  The break through is secondary to the acute illness.  I would suggest that we don't need long term DOAC if she is not having documented paroxysms outside of this event.   PULMONARY NODULES:   Follow up per primary team and primary MD.   TOBACCO ABUSE:  Educated.    HYPOKALEMIA:  This has been replaced.     HYPERTHYROID:   TSH is low.  She is being treated with Tapazole.     SignedMinus Breeding 03/22/2019, 2:08 PM

## 2019-03-22 NOTE — ED Notes (Signed)
Dinner tray ordered by [employer]

## 2019-03-22 NOTE — ED Notes (Signed)
CBG Results of 175 reported to [place], Therapist, sports.

## 2019-03-23 ENCOUNTER — Inpatient Hospital Stay (HOSPITAL_COMMUNITY): Payer: 59

## 2019-03-23 DIAGNOSIS — A4151 Sepsis due to Escherichia coli [E. coli]: Principal | ICD-10-CM

## 2019-03-23 LAB — MAGNESIUM: Magnesium: 1.6 mg/dL — ABNORMAL LOW (ref 1.7–2.4)

## 2019-03-23 LAB — BASIC METABOLIC PANEL
Anion gap: 12 (ref 5–15)
Anion gap: 9 (ref 5–15)
BUN: 16 mg/dL (ref 8–23)
BUN: 17 mg/dL (ref 8–23)
CO2: 17 mmol/L — ABNORMAL LOW (ref 22–32)
CO2: 21 mmol/L — ABNORMAL LOW (ref 22–32)
Calcium: 8.1 mg/dL — ABNORMAL LOW (ref 8.9–10.3)
Calcium: 7.9 mg/dL — ABNORMAL LOW (ref 8.9–10.3)
Chloride: 102 mmol/L (ref 98–111)
Chloride: 102 mmol/L (ref 98–111)
Creatinine, Ser: 0.72 mg/dL (ref 0.44–1.00)
Creatinine, Ser: 0.69 mg/dL (ref 0.44–1.00)
GFR calc Af Amer: >60 mL/min (ref >60)
GFR calc Af Amer: >60 mL/min (ref >60)
GFR calc non Af Amer: >60 mL/min (ref >60)
GFR calc non Af Amer: >60 mL/min (ref >60)
Glucose, Bld: 234 mg/dL — ABNORMAL HIGH (ref 70–99)
Glucose, Bld: 164 mg/dL — ABNORMAL HIGH (ref 70–99)
Potassium: 3.4 mmol/L — ABNORMAL LOW (ref 3.5–5.1)
Potassium: 2.8 mmol/L — ABNORMAL LOW (ref 3.5–5.1)
Sodium: 131 mmol/L — ABNORMAL LOW (ref 135–145)
Sodium: 132 mmol/L — ABNORMAL LOW (ref 135–145)

## 2019-03-23 LAB — BLOOD CULTURE ID PANEL (REFLEXED)

## 2019-03-23 LAB — GLUCOSE, CAPILLARY
Glucose-Capillary: 148 mg/dL — ABNORMAL HIGH (ref 70–99)
Glucose-Capillary: 149 mg/dL — ABNORMAL HIGH (ref 70–99)
Glucose-Capillary: 150 mg/dL — ABNORMAL HIGH (ref 70–99)
Glucose-Capillary: 156 mg/dL — ABNORMAL HIGH (ref 70–99)
Glucose-Capillary: 158 mg/dL — ABNORMAL HIGH (ref 70–99)
Glucose-Capillary: 160 mg/dL — ABNORMAL HIGH (ref 70–99)
Glucose-Capillary: 189 mg/dL — ABNORMAL HIGH (ref 70–99)
Glucose-Capillary: 194 mg/dL — ABNORMAL HIGH (ref 70–99)
Glucose-Capillary: 216 mg/dL — ABNORMAL HIGH (ref 70–99)

## 2019-03-23 LAB — CBC
HCT: 43 % (ref 36.0–46.0)
Hemoglobin: 14.7 g/dL (ref 12.0–15.0)
MCH: 30.7 pg (ref 26.0–34.0)
MCHC: 34.2 g/dL (ref 30.0–36.0)
MCV: 89.8 fL (ref 80.0–100.0)
Platelets: 130 10*3/uL — ABNORMAL LOW (ref 150–400)
RBC: 4.79 MIL/uL (ref 3.87–5.11)
RDW: 12.9 % (ref 11.5–15.5)
WBC: 11 10*3/uL — ABNORMAL HIGH (ref 4.0–10.5)
nRBC: 0 % (ref 0.0–0.2)

## 2019-03-23 LAB — ECHOCARDIOGRAM COMPLETE
Height: 64 in
Weight: 2480 oz

## 2019-03-23 LAB — C DIFFICILE QUICK SCREEN W PCR REFLEX
C Diff antigen: NEGATIVE
C Diff interpretation: NOT DETECTED
C Diff toxin: NEGATIVE

## 2019-03-23 LAB — HEMOGLOBIN A1C
Hgb A1c MFr Bld: 13.9 % — ABNORMAL HIGH (ref 4.8–5.6)
Mean Plasma Glucose: 352.23 mg/dL

## 2019-03-23 LAB — T3, FREE: T3, Free: 1.6 pg/mL — ABNORMAL LOW (ref 2.0–4.4)

## 2019-03-23 LAB — BETA-HYDROXYBUTYRIC ACID: Beta-Hydroxybutyric Acid: 2.66 mmol/L — ABNORMAL HIGH (ref 0.05–0.27)

## 2019-03-23 IMAGING — US US ABDOMEN LIMITED
1 series · 14 of 25 positions shown · non-contrast
Comparison: None.

CLINICAL DATA: Right upper quadrant pain

EXAM:
ULTRASOUND ABDOMEN LIMITED RIGHT UPPER QUADRANT

[Series 1: us abdomen limited · 14 of 42 slices shown]
[im 1/42]
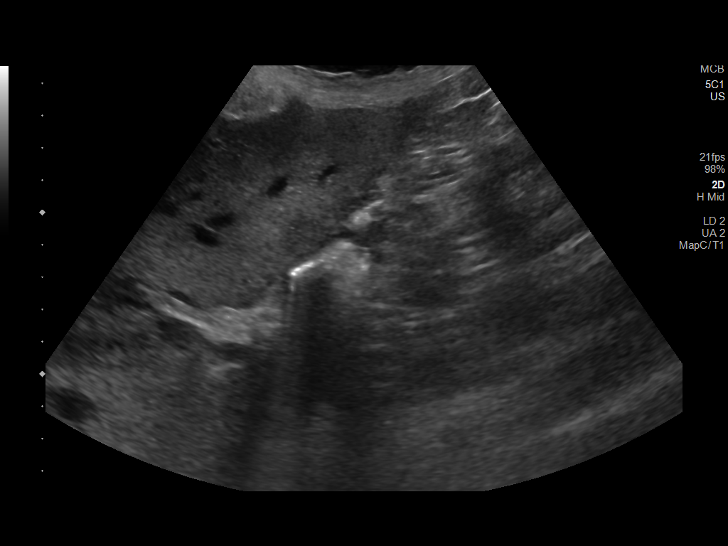
[im 4/42]
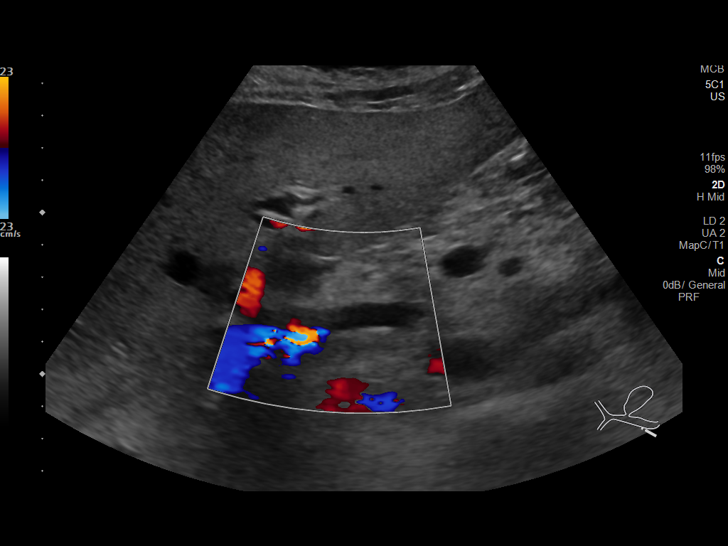
[im 7/42]
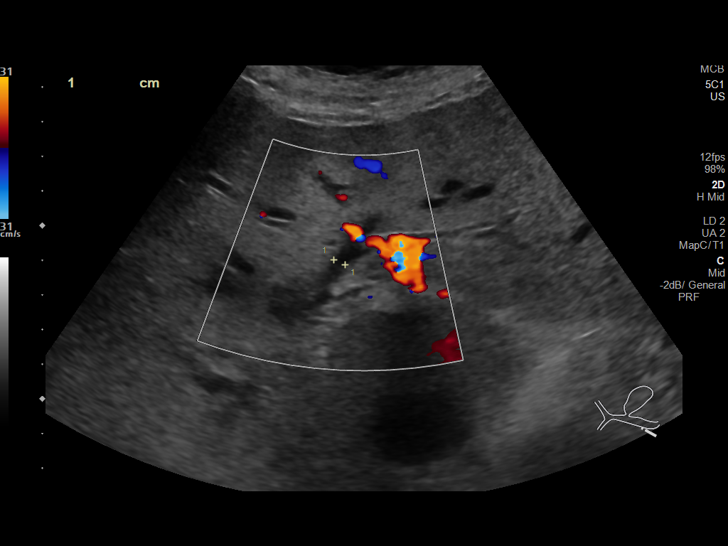
[im 11/42]
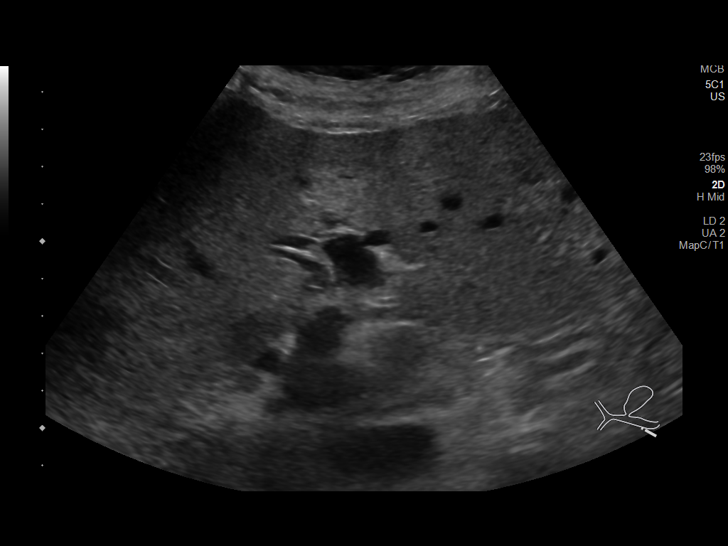
[im 14/42]
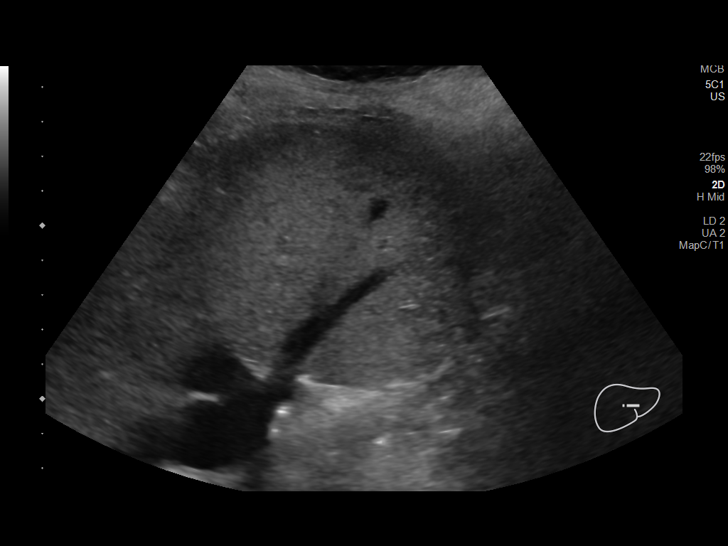
[im 16/42]
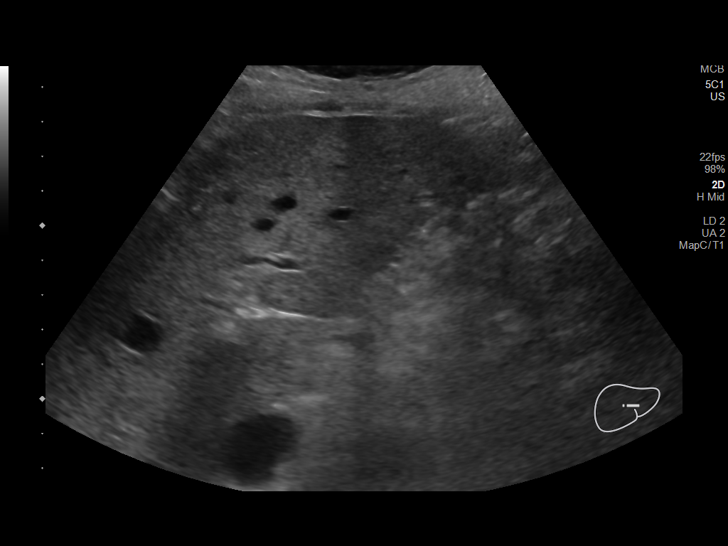
[im 19/42]
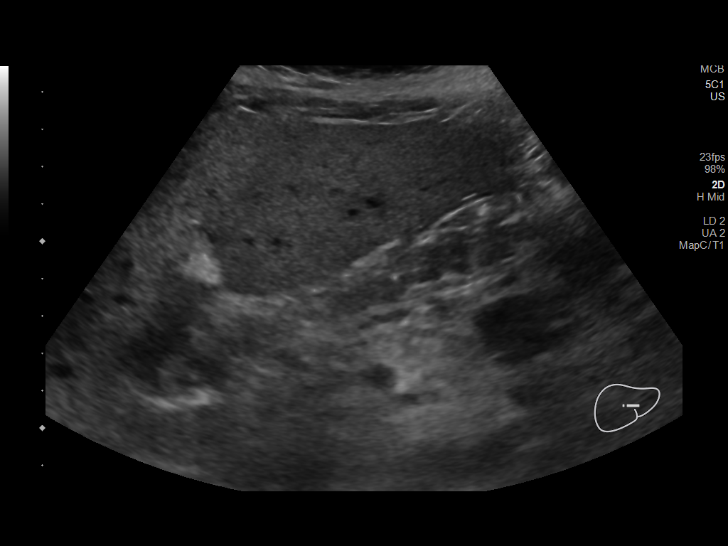
[im 23/42]
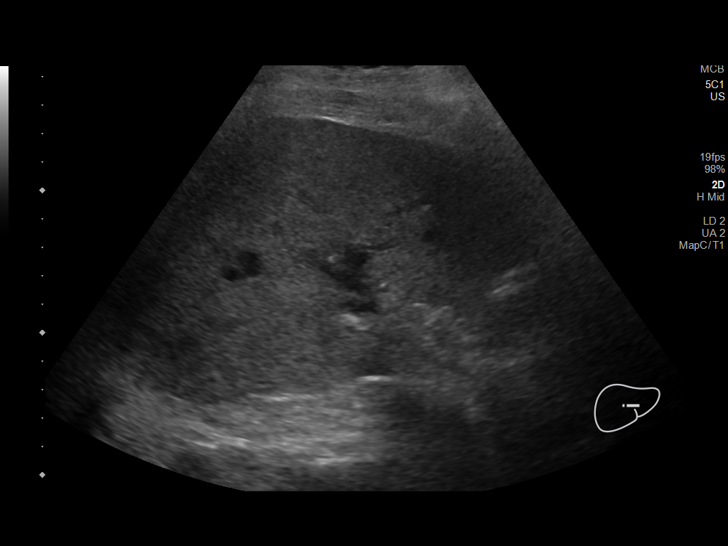
[im 26/42]
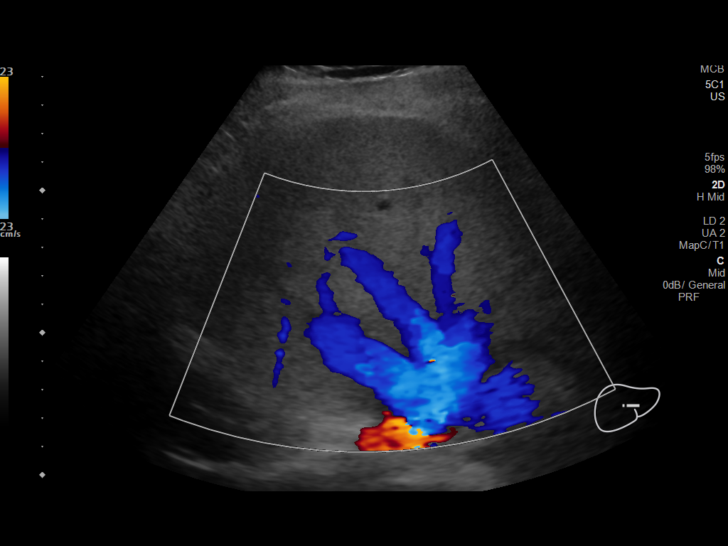
[im 28/42]
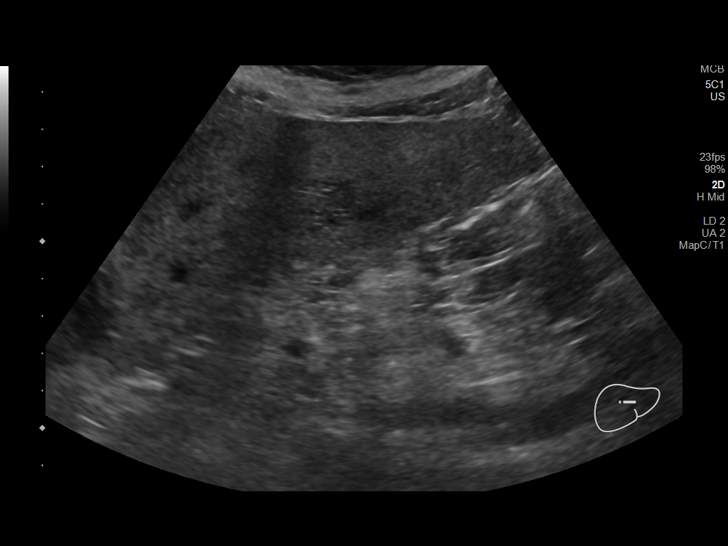
[im 31/42]
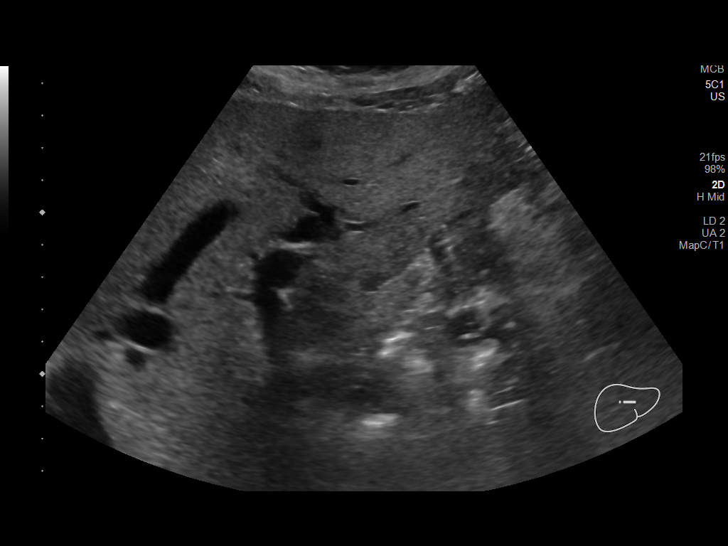
[im 35/42]
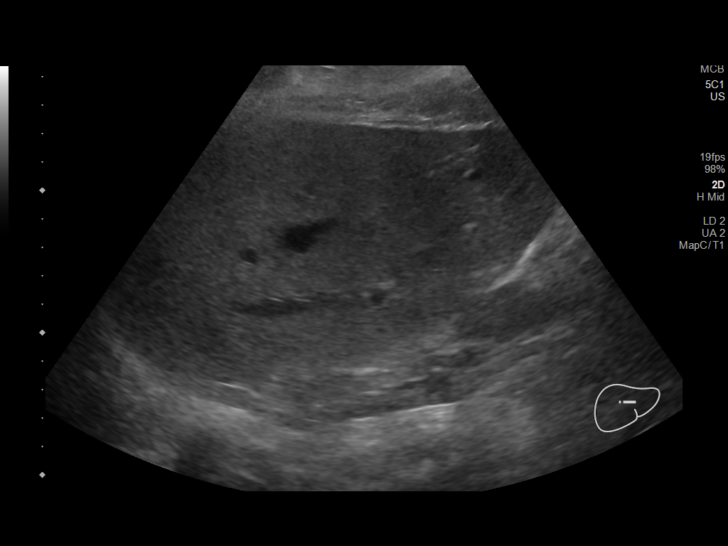
[im 38/42]
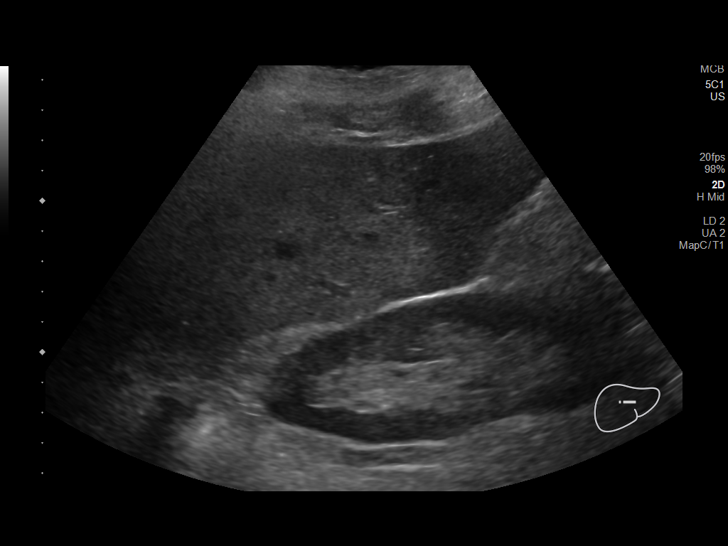
[im 42/42]
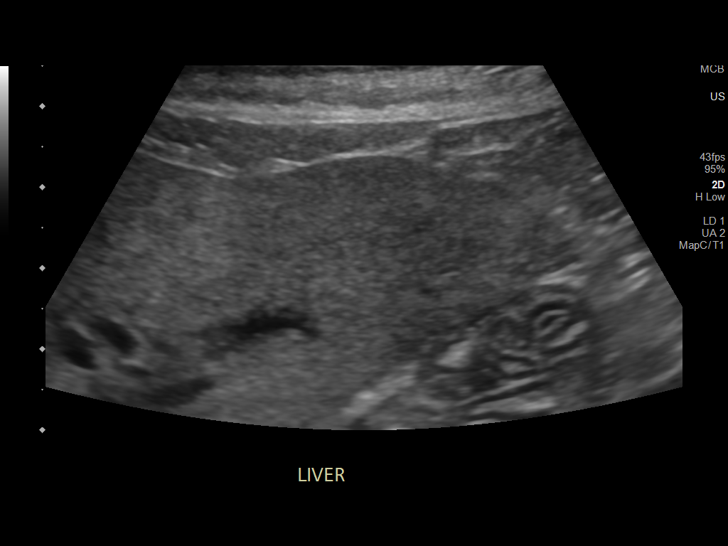

[14 of 25 positions shown; findings below may reference images not displayed]

FINDINGS: Gallbladder:

Cholecystectomy.

Common bile duct:

Diameter: 7.7 mm.

Liver:

No focal lesion identified. Mildly increased parenchymal
echogenicity. There is intrahepatic biliary dilatation. Portal vein
is patent on color Doppler imaging with normal direction of blood
flow towards the liver.

Other: None.
IMPRESSION: Mildly increased liver echogenicity likely reflecting steatosis.

Mild intrahepatic and extrahepatic biliary dilatation probably
related to post cholecystectomy state.

## 2019-03-23 MED ORDER — INSULIN GLARGINE 100 UNIT/ML ~~LOC~~ SOLN
20.0000 [IU] | Freq: Every day | SUBCUTANEOUS | Status: DC
  Administered 2019-03-24 – 2019-03-28 (×5): 20 [IU] via SUBCUTANEOUS
  Filled 2019-03-23 (×5): qty 0.2

## 2019-03-23 MED ORDER — INSULIN GLARGINE 100 UNIT/ML ~~LOC~~ SOLN
20.0000 [IU] | SUBCUTANEOUS | Status: DC
  Administered 2019-03-23: 20 [IU] via SUBCUTANEOUS
  Filled 2019-03-23: qty 0.2

## 2019-03-23 MED ORDER — INSULIN ASPART 100 UNIT/ML ~~LOC~~ SOLN
0.0000 [IU] | Freq: Three times a day (TID) | SUBCUTANEOUS | Status: DC
  Administered 2019-03-23 (×2): 3 [IU] via SUBCUTANEOUS
  Administered 2019-03-23: 5 [IU] via SUBCUTANEOUS
  Administered 2019-03-24 (×3): 3 [IU] via SUBCUTANEOUS
  Administered 2019-03-25 – 2019-03-26 (×3): 2 [IU] via SUBCUTANEOUS
  Administered 2019-03-27 – 2019-03-28 (×2): 3 [IU] via SUBCUTANEOUS

## 2019-03-23 MED ORDER — POTASSIUM CHLORIDE CRYS ER 20 MEQ PO TBCR
60.0000 meq | EXTENDED_RELEASE_TABLET | Freq: Once | ORAL | Status: AC
  Administered 2019-03-23: 60 meq via ORAL
  Filled 2019-03-23: qty 3

## 2019-03-23 MED ORDER — MORPHINE SULFATE (PF) 2 MG/ML IV SOLN
2.0000 mg | Freq: Once | INTRAVENOUS | Status: AC
  Administered 2019-03-23: 2 mg via INTRAVENOUS
  Filled 2019-03-23: qty 1

## 2019-03-23 MED ORDER — SODIUM CHLORIDE 0.9 % IV SOLN
2.0000 g | INTRAVENOUS | Status: DC
  Administered 2019-03-23 – 2019-03-28 (×6): 2 g via INTRAVENOUS
  Filled 2019-03-23 (×2): qty 20
  Filled 2019-03-23: qty 2
  Filled 2019-03-23 (×2): qty 20
  Filled 2019-03-23 (×3): qty 2

## 2019-03-23 MED ORDER — MORPHINE SULFATE (PF) 2 MG/ML IV SOLN
2.0000 mg | INTRAVENOUS | Status: DC | PRN
  Administered 2019-03-23: 4 mg via INTRAVENOUS
  Administered 2019-03-23 (×2): 2 mg via INTRAVENOUS
  Administered 2019-03-24 (×2): 4 mg via INTRAVENOUS
  Administered 2019-03-24 (×2): 2 mg via INTRAVENOUS
  Administered 2019-03-25 (×3): 4 mg via INTRAVENOUS
  Administered 2019-03-25 – 2019-03-26 (×3): 2 mg via INTRAVENOUS
  Administered 2019-03-26: 4 mg via INTRAVENOUS
  Administered 2019-03-26 – 2019-03-27 (×3): 2 mg via INTRAVENOUS
  Administered 2019-03-28: 4 mg via INTRAVENOUS
  Filled 2019-03-23: qty 1
  Filled 2019-03-23 (×2): qty 2
  Filled 2019-03-23: qty 1
  Filled 2019-03-23: qty 2
  Filled 2019-03-23: qty 1
  Filled 2019-03-23: qty 2
  Filled 2019-03-23 (×2): qty 1
  Filled 2019-03-23: qty 2
  Filled 2019-03-23 (×2): qty 1
  Filled 2019-03-23: qty 2
  Filled 2019-03-23: qty 1
  Filled 2019-03-23: qty 2
  Filled 2019-03-23 (×2): qty 1
  Filled 2019-03-23: qty 2

## 2019-03-23 MED ORDER — POTASSIUM CHLORIDE CRYS ER 20 MEQ PO TBCR
40.0000 meq | EXTENDED_RELEASE_TABLET | Freq: Once | ORAL | Status: AC
  Administered 2019-03-23: 40 meq via ORAL

## 2019-03-23 MED ORDER — SODIUM CHLORIDE 0.9 % IV SOLN: INTRAVENOUS | Status: DC

## 2019-03-23 MED ORDER — INSULIN ASPART 100 UNIT/ML ~~LOC~~ SOLN: 0.0000 [IU] | Freq: Every day | SUBCUTANEOUS | Status: DC

## 2019-03-23 NOTE — Progress Notes (Addendum)
Transition from ENDO tool to insulin SubQ.   Bodenhiemer was notified. Awaiting Pharmacy to send Lantus SQ.  AM BS 216. Covered SSI. Passed on day shift
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
## 2019-03-23 NOTE — Progress Notes (Signed)
PROGRESS NOTE    Christy Abbott  DJM:[id number] DOB: November 26, 1955 DOA: 03/21/2019 PCP: [name], NP   Brief Narrative: Christy Abbott is a 64 y.o. female with medical history significant of hypertension, DM type II, atrial flutter seen previously by Dr. Lovena Le, hyperthyroidism, and neuroendocrine tumor in 2016 treated at [facility]. Patient presented secondary to right sided chest pain and found to have concern for sepsis with a possible viral illness source. Blood cultures significant for E. Coli.   Assessment & Plan:   Active Problems:   Tobacco use disorder   Hypokalemia   Hyperthyroidism   DKA, type 2 (HCC)   SVT (supraventricular tachycardia) (HCC)   Viral illness   Sepsis ([place])   Pulmonary nodules   Sepsis Present on admission. Secondary to E. Coli bacteremia.  E. Coli bacteremia Unknown source. Urinalysis was not consistent with UTI. Associated RUQ in setting of cholecystectomy.  -Continue Ceftriaxone -Blood cultures pending for sensitivities  RUQ pain Possibly related to biliary system. Associated bacteremia. -RUQ ultrasound -Morphine IV prn  DKA Diabetes mellitus, type 2 Likely precipitated by acute infection. Patient managed on insulin drip and is now transitioned to subcutaneous insulin. Patient does not take any insulin as an outpatient. On Amaryl and Metformin as an outpatient. Last hemoglobin A1C was 10.9% in 05/2018. -Continue Lantus 20 units and SSI -Obtain hemoglobin A1C  History of SVT History of Atrial flutter/fibrillation Rate controlled. Normal rhythm -Continue flecainide and Coreg  Pulmonary nodules Patient is a smoker. No previous knowledge of an abnormal lung exam. Will need outpatient follow-up imaging.  Hyperthyroidism -Continue methimazole  Tobacco use   DVT prophylaxis: Lovenox Code Status:   Code Status: Full Code Family Communication: None at bedside Disposition Plan: Discharge pending culture data and improvement of  pain in addition to continued workup of pain   Consultants:   None  Procedures:   None  Antimicrobials:  Ceftriaxone    Subjective: RUQ/right chest pain. No radiation. No nausea/vomiting.  Objective: Vitals:   03/23/19 0035 03/23/19 0333 03/23/19 0428 03/23/19 0734  BP: (!) 110/59 (!) 102/57  114/63  Pulse: 93 81  80  Resp: 20 20  18   Temp: 99.1 F (37.3 C) 97.9 F (36.6 C)  98 F (36.7 C)  TempSrc: Oral Oral  Oral  SpO2: 90% 94%  96%  Weight:   67 kg   Height:        Intake/Output Summary (Last 24 hours) at 03/23/2019 1043 Last data filed at 03/23/2019 0506 Gross per 24 hour  Intake 2166.5 ml  Output 731 ml  Net 1435.5 ml   Filed Weights   03/21/19 2358 03/22/19 2000 03/23/19 0428  Weight: 70.3 kg 66.8 kg 67 kg    Examination:  General exam: Appears calm and comfortable Respiratory system: RLL rhonchi. Respiratory effort normal. Cardiovascular system: S1 & S2 heard, RRR. No murmurs, rubs, gallops or clicks. Gastrointestinal system: Abdomen is nondistended, soft and tender in RUQ/epigastrium with associated guarding. No organomegaly or masses felt. Normal bowel sounds heard. Central nervous system: Alert and oriented. No focal neurological deficits. Extremities: No edema. No calf tenderness Skin: No cyanosis. No rashes Psychiatry: Judgement and insight appear normal. Mood & affect appropriate.     Data Reviewed: I have personally reviewed following labs and imaging studies  CBC: Recent Labs  Lab 03/22/19 0003 03/22/19 0638 03/23/19 0004  WBC 13.8*  --  11.0*  NEUTROABS 11.6*  --   --   HGB 16.8* 16.0* 14.7  HCT 48.1* 47.0*  43.0  MCV 89.1  --  89.8  PLT 158  --  034*   Basic Metabolic Panel: Recent Labs  Lab 03/22/19 0627 03/22/19 0638 03/22/19 1008 03/22/19 1640 03/23/19 0004 03/23/19 0501  NA 132* 133* 132* 132* 131* 132*  K 3.2* 3.3* 3.0* 3.6 3.4* 2.8*  CL 98  --  102 105 102 102  CO2 17*  --  16* 17* 17* 21*  GLUCOSE 352*  --   211* 145* 234* 164*  BUN 15  --  11 15 16 17   CREATININE 1.12*  --  0.85 0.64 0.72 0.69  CALCIUM 7.9*  --  7.9* 7.7* 8.1* 7.9*  MG  --   --  1.4*  --  1.6*  --    GFR: Estimated Creatinine Clearance: 67.7 mL/min (by C-G formula based on SCr of 0.69 mg/dL). Liver Function Tests: Recent Labs  Lab 03/22/19 0003  AST 26  ALT 31  ALKPHOS 185*  BILITOT 1.8*  PROT 6.5  ALBUMIN 3.1*   Recent Labs  Lab 03/22/19 0424  LIPASE 18   No results for input(s): AMMONIA in the last 168 hours. Coagulation Profile: Recent Labs  Lab 03/22/19 1008  INR 1.0   Cardiac Enzymes: No results for input(s): CKTOTAL, CKMB, CKMBINDEX, TROPONINI in the last 168 hours. BNP (last 3 results) No results for input(s): PROBNP in the last 8760 hours. HbA1C: Recent Labs    03/23/19 0004  HGBA1C 13.9*   CBG: Recent Labs  Lab 03/23/19 0134 03/23/19 0236 03/23/19 0332 03/23/19 0439 03/23/19 0632  GLUCAP 160* 150* 156* 149* 216*   Lipid Profile: No results for input(s): CHOL, HDL, LDLCALC, TRIG, CHOLHDL, LDLDIRECT in the last 72 hours. Thyroid Function Tests: Recent Labs    03/22/19 0910  TSH 0.132*  FREET4 1.13*  T3FREE 1.6*   Anemia Panel: No results for input(s): VITAMINB12, FOLATE, FERRITIN, TIBC, IRON, RETICCTPCT in the last 72 hours. Sepsis Labs: Recent Labs  Lab 03/22/19 1008 03/22/19 2025  PROCALCITON 12.90  --   LATICACIDVEN 1.6 1.0    Recent Results (from the past 240 hour(s))  Respiratory Panel by RT PCR (Flu A&B, Covid) - Nasopharyngeal Swab     Status: None   Collection Time: 03/22/19  4:24 AM   Specimen: Nasopharyngeal Swab  Result Value Ref Range Status   SARS Coronavirus 2 by RT PCR NEGATIVE NEGATIVE Final    Comment: (NOTE) SARS-CoV-2 target nucleic acids are NOT DETECTED. The SARS-CoV-2 RNA is generally detectable in upper respiratoy specimens during the acute phase of infection. The lowest concentration of SARS-CoV-2 viral copies this assay can detect is 131  copies/mL. A negative result does not preclude SARS-Cov-2 infection and should not be used as the sole basis for treatment or other patient management decisions. A negative result may occur with  improper specimen collection/handling, submission of specimen other than nasopharyngeal swab, presence of viral mutation(s) within the areas targeted by this assay, and inadequate number of viral copies (<131 copies/mL). A negative result must be combined with clinical observations, patient history, and epidemiological information. The expected result is Negative. Fact Sheet for Patients:  PinkCheek.be Fact Sheet for Healthcare Providers:  GravelBags.it This test is not yet ap proved or cleared by the [place] FDA and  has been authorized for detection and/or diagnosis of SARS-CoV-2 by FDA under an Emergency Use Authorization (EUA). This EUA will remain  in effect (meaning this test can be used) for the duration of the COVID-19 declaration under Section 564(b)(1) of  the Act, 21 U.S.C. section 360bbb-3(b)(1), unless the authorization is terminated or revoked sooner.    Influenza A by PCR NEGATIVE NEGATIVE Final   Influenza B by PCR NEGATIVE NEGATIVE Final    Comment: (NOTE) The Xpert Xpress SARS-CoV-2/FLU/RSV assay is intended as an aid in  the diagnosis of influenza from Nasopharyngeal swab specimens and  should not be used as a sole basis for treatment. Nasal washings and  aspirates are unacceptable for Xpert Xpress SARS-CoV-2/FLU/RSV  testing. Fact Sheet for Patients: PinkCheek.be Fact Sheet for Healthcare Providers: GravelBags.it This test is not yet approved or cleared by the [place] FDA and  has been authorized for detection and/or diagnosis of SARS-CoV-2 by  FDA under an Emergency Use Authorization (EUA). This EUA will remain  in effect (meaning this test can  be used) for the duration of the  Covid-19 declaration under Section 564(b)(1) of the Act, 21  U.S.C. section 360bbb-3(b)(1), unless the authorization is  terminated or revoked. Performed at [facility], [place] [address], [place], [place] [address]   Respiratory Panel by PCR     Status: None   Collection Time: 03/22/19  8:53 AM   Specimen: Nasopharyngeal Swab; Respiratory  Result Value Ref Range Status   Adenovirus NOT DETECTED NOT DETECTED Final   Coronavirus 229E NOT DETECTED NOT DETECTED Final    Comment: (NOTE) The Coronavirus on the Respiratory Panel, DOES NOT test for the novel  Coronavirus (2019 nCoV)    Coronavirus HKU1 NOT DETECTED NOT DETECTED Final   Coronavirus NL63 NOT DETECTED NOT DETECTED Final   Coronavirus OC43 NOT DETECTED NOT DETECTED Final   Metapneumovirus NOT DETECTED NOT DETECTED Final   Rhinovirus / Enterovirus NOT DETECTED NOT DETECTED Final   Influenza A NOT DETECTED NOT DETECTED Final   Influenza B NOT DETECTED NOT DETECTED Final   Parainfluenza Virus 1 NOT DETECTED NOT DETECTED Final   Parainfluenza Virus 2 NOT DETECTED NOT DETECTED Final   Parainfluenza Virus 3 NOT DETECTED NOT DETECTED Final   Parainfluenza Virus 4 NOT DETECTED NOT DETECTED Final   Respiratory Syncytial Virus NOT DETECTED NOT DETECTED Final   Bordetella pertussis NOT DETECTED NOT DETECTED Final   Chlamydophila pneumoniae NOT DETECTED NOT DETECTED Final   Mycoplasma pneumoniae NOT DETECTED NOT DETECTED Final    Comment: Performed at [facility], [place]. [address], [place], [place] [address]  Culture, blood (x 2)     Status: None (Preliminary result)   Collection Time: 03/22/19 10:44 AM   Specimen: BLOOD LEFT HAND  Result Value Ref Range Status   Specimen Description BLOOD LEFT HAND  Final   Special Requests   Final    BOTTLES DRAWN AEROBIC AND ANAEROBIC Blood Culture adequate volume   Culture  Setup Time   Final    IN BOTH AEROBIC AND ANAEROBIC BOTTLES GRAM NEGATIVE  RODS CRITICAL VALUE NOTED.  VALUE IS CONSISTENT WITH PREVIOUSLY REPORTED AND CALLED VALUE.    Culture   Final    GRAM NEGATIVE RODS TOO YOUNG TO READ Performed at [facility], [place] [address], [place], [place] [address]    Report Status PENDING  Incomplete  Culture, blood (x 2)     Status: Abnormal (Preliminary result)   Collection Time: 03/22/19 10:44 AM   Specimen: BLOOD RIGHT HAND  Result Value Ref Range Status   Specimen Description BLOOD RIGHT HAND  Final   Special Requests   Final    BOTTLES DRAWN AEROBIC ONLY Blood Culture adequate  volume   Culture  Setup Time   Final    AEROBIC BOTTLE ONLY GRAM NEGATIVE RODS CRITICAL RESULT CALLED TO, READ BACK BY AND VERIFIED WITH: CRYSTAL ROBERTSON PHARM @ 0205 ON 03/23/19 BY ROBINSON Z.     Culture (A)  Final    ESCHERICHIA COLI CULTURE REINCUBATED FOR BETTER GROWTH Performed at [facility], [place] [address], [place], [place] [address]    Report Status PENDING  Incomplete  Blood Culture ID Panel (Reflexed)     Status: Abnormal   Collection Time: 03/22/19 10:44 AM  Result Value Ref Range Status   Enterococcus species NOT DETECTED NOT DETECTED Final   Listeria monocytogenes NOT DETECTED NOT DETECTED Final   Staphylococcus species NOT DETECTED NOT DETECTED Final   Staphylococcus aureus (BCID) NOT DETECTED NOT DETECTED Final   Streptococcus species NOT DETECTED NOT DETECTED Final   Streptococcus agalactiae NOT DETECTED NOT DETECTED Final   Streptococcus pneumoniae NOT DETECTED NOT DETECTED Final   Streptococcus pyogenes NOT DETECTED NOT DETECTED Final   Acinetobacter baumannii NOT DETECTED NOT DETECTED Final   Enterobacteriaceae species DETECTED (A) NOT DETECTED Final    Comment: Enterobacteriaceae represent a large family of gram-negative bacteria, not a single organism. CRITICAL RESULT CALLED TO, READ BACK BY AND VERIFIED WITH: CRYSTAL ROBERTSON [facility]ARM @ 0205 ON 03/23/19 BY ROBINSON Z.     Enterobacter cloacae complex NOT  DETECTED NOT DETECTED Final   Escherichia coli DETECTED (A) NOT DETECTED Final    Comment: CRITICAL RESULT CALLED TO, READ BACK BY AND VERIFIED WITH: CRYSTAL ROBERTSON [facility]ARM @ 0205 ON 03/23/19 BY ROBINSON Z.     Klebsiella oxytoca NOT DETECTED NOT DETECTED Final   Klebsiella pneumoniae NOT DETECTED NOT DETECTED Final   Proteus species NOT DETECTED NOT DETECTED Final   Serratia marcescens NOT DETECTED NOT DETECTED Final   Carbapenem resistance NOT DETECTED NOT DETECTED Final   Haemophilus influenzae NOT DETECTED NOT DETECTED Final   Neisseria meningitidis NOT DETECTED NOT DETECTED Final   Pseudomonas aeruginosa NOT DETECTED NOT DETECTED Final   Candida albicans NOT DETECTED NOT DETECTED Final   Candida glabrata NOT DETECTED NOT DETECTED Final   Candida krusei NOT DETECTED NOT DETECTED Final   Candida parapsilosis NOT DETECTED NOT DETECTED Final   Candida tropicalis NOT DETECTED NOT DETECTED Final    Comment: Performed at [facility], [place]. [address], [place], [place] [address]         Radiology Studies: CT Angio Chest PE W and/or Wo Contrast  Result Date: 03/22/2019 CLINICAL DATA:  Shortness of breath and right lower lobe pain for 4 days EXAM: CT ANGIOGRAPHY CHEST WITH CONTRAST TECHNIQUE: Multidetector CT imaging of the chest was performed using the standard protocol during bolus administration of intravenous contrast. Multiplanar CT image reconstructions and MIPs were obtained to evaluate the vascular anatomy. CONTRAST:  119mL OMNIPAQUE IOHEXOL 350 MG/ML SOLN COMPARISON:  None. FINDINGS: Cardiovascular: --Pulmonary arteries: Contrast injection is sufficient to demonstrate satisfactory opacification of the pulmonary arteries to the segmental level, with attenuation of at least 200 HU at the main pulmonary artery. There is no pulmonary embolus. The main pulmonary artery is within normal limits for size. --Aorta: Limited opacification of the aorta due to bolus timing optimization for the  pulmonary arteries. The course and caliber of the aorta are normal. Conventional 3 vessel aortic branching pattern. There is mild aortic atherosclerosis. --Heart: Normal size. No pericardial effusion. Mediastinum/Nodes: No mediastinal, hilar or axillary lymphadenopathy. The visualized thyroid and thoracic esophageal course  are unremarkable. Lungs/Pleura: Multiple nodular opacities in the right lung, largest of which is in the posterior right upper lobe measures approximately 1.5 cm. There is a 2 cm left parahilar nodule. No pleural effusion. Upper Abdomen: Contrast bolus timing is not optimized for evaluation of the abdominal organs. The visualized portions of the organs of the upper abdomen are normal. Musculoskeletal: No chest wall abnormality. No bony spinal canal stenosis. Review of the MIP images confirms the above findings. IMPRESSION: 1. No pulmonary embolus to the segmental level. 2. Multiple nodular opacities in both lungs, the largest of which measures 2.0 cm. These are concerning for malignancy. Non-contrast chest CT at 3-6 months is recommended. If the nodules are stable at time of repeat CT, then future CT at 18-24 months (from today's scan) is considered optional for low-risk patients, but is recommended for high-risk patients. This recommendation follows the consensus statement: Guidelines for Management of Incidental Pulmonary Nodules Detected on CT Images: From the Fleischner Society 2017; Radiology 2017; 284:228-243. 3. Aortic Atherosclerosis (ICD10-I70.0). Electronically Signed   By: Ulyses Jarred M.D.   On: 03/22/2019 06:05   DG Chest Portable 1 View  Result Date: 03/22/2019 CLINICAL DATA:  64 year old female with shortness of breath. EXAM: PORTABLE CHEST 1 VIEW COMPARISON:  Chest radiograph dated 06/01/2014 FINDINGS: Minimal bibasilar interstitial nodularity may represent interstitial prominence. Atypical infiltrate is not excluded. Clinical correlation is recommended. No focal consolidation,  pleural effusion, or pneumothorax. The cardiac silhouette is within normal limits. Coronary vascular calcification and atherosclerotic calcification of the aortic arch. No acute osseous pathology. IMPRESSION: 1. No focal consolidation. 2. Minimal bibasilar interstitial nodularity may represent interstitial prominence or atypical infection. Clinical correlation is recommended. Electronically Signed   By: Anner Crete M.D.   On: 03/22/2019 00:49   ECHOCARDIOGRAM COMPLETE  Result Date: 03/23/2019   ECHOCARDIOGRAM REPORT   Patient Name:   Christy Abbott Date of Exam: 03/22/2019 Medical Rec #:  [id number]        Height:       64.0 in Accession #:    [id number]       Weight:       155.0 lb Date of Birth:  [birth year]         BSA:          1.76 m Patient Age:    89 years         BP:           143/86 mmHg Patient Gender: F                HR:           100 bpm. Exam Location:  Inpatient Procedure: 2D Echo and Intracardiac Opacification Agent Indications:    Atrial Flutter 427.32/I48.92  History:        Patient has prior history of Echocardiogram examinations, most                 recent 12/22/2012. Risk Factors:Diabetes and Hypertension.  Sonographer:    Clayton Lefort RDCS (AE) Referring Phys: [id number] RONDELL A SMITH IMPRESSIONS  1. Definity contrast agent was given IV to delineate the left ventricular endocardial borders.  2. Left ventricular ejection fraction, by visual estimation, is 60 to 65%. The left ventricle has normal function. There is no left ventricular hypertrophy.  3. Left ventricular diastolic parameters are consistent with Grade I diastolic dysfunction (impaired relaxation).  4. The left ventricle has no regional wall motion abnormalities.  5. Global right ventricle has normal  systolic function.The right ventricular size is normal.  6. Left atrial size was normal.  7. Right atrial size was normal.  8. The mitral valve is normal in structure. Trivial mitral valve regurgitation. No evidence of mitral  stenosis.  9. The tricuspid valve is normal in structure. 10. The aortic valve was not well visualized. Aortic valve regurgitation is not visualized. 11. The pulmonic valve was not well visualized. Pulmonic valve regurgitation is not visualized. 12. Aortic dilatation noted. 13. There is mild dilatation of the aortic root measuring 40 mm. 14. The inferior vena cava is dilated in size with >50% respiratory variability, suggesting right atrial pressure of 8 mmHg. 15. Technically difficult; definity used; normal LV function; grade 1 diastolic dysfunction; mildly dilated aortic root. FINDINGS  Left Ventricle: Left ventricular ejection fraction, by visual estimation, is 60 to 65%. The left ventricle has normal function. Definity contrast agent was given IV to delineate the left ventricular endocardial borders. The left ventricle has no regional wall motion abnormalities. There is no left ventricular hypertrophy. Left ventricular diastolic parameters are consistent with Grade I diastolic dysfunction (impaired relaxation). Normal left atrial pressure. Right Ventricle: The right ventricular size is normal. Global RV systolic function is has normal systolic function. Left Atrium: Left atrial size was normal in size. Right Atrium: Right atrial size was normal in size Pericardium: There is no evidence of pericardial effusion. Mitral Valve: The mitral valve is normal in structure. Trivial mitral valve regurgitation. No evidence of mitral valve stenosis by observation. Tricuspid Valve: The tricuspid valve is normal in structure. Tricuspid valve regurgitation is trivial. Aortic Valve: The aortic valve was not well visualized. Aortic valve regurgitation is not visualized. Pulmonic Valve: The pulmonic valve was not well visualized. Pulmonic valve regurgitation is not visualized. Pulmonic regurgitation is not visualized. Aorta: Aortic dilatation noted. There is mild dilatation of the aortic root measuring 40 mm. Venous: The inferior  vena cava is dilated in size with greater than 50% respiratory variability, suggesting right atrial pressure of 8 mmHg.  Additional Comments: Technically difficult; definity used; normal LV function; grade 1 diastolic dysfunction; mildly dilated aortic root.  LEFT VENTRICLE PLAX 2D LVIDd:         4.17 cm LVIDs:         2.95 cm LV PW:         0.98 cm LV IVS:        1.26 cm LVOT diam:     2.00 cm LV SV:         44 ml LV SV Index:   24.29 LVOT Area:     3.14 cm  RIGHT VENTRICLE             IVC RV S prime:     14.40 cm/s  IVC diam: 2.33 cm TAPSE (M-mode): 2.2 cm LEFT ATRIUM             Index       RIGHT ATRIUM           Index LA diam:        2.50 cm 1.42 cm/m  RA Area:     12.70 cm LA Vol (A2C):   40.6 ml 23.13 ml/m RA Volume:   32.90 ml  18.74 ml/m LA Vol (A4C):   15.0 ml 8.54 ml/m LA Biplane Vol: 27.0 ml 15.38 ml/m  AORTIC VALVE LVOT Vmax:   106.44 cm/s LVOT Vmean:  74.600 cm/s LVOT VTI:    0.196 m  AORTA Ao Root diam: 3.40 cm  Ao Asc diam:  4.00 cm  SHUNTS Systemic VTI:  0.20 m Systemic Diam: 2.00 cm  Kirk Ruths MD Electronically signed by Kirk Ruths MD Signature Date/Time: 03/23/2019/7:16:15 AM    Final         Scheduled Meds: . carvedilol  12.5 mg Oral BID WC  . enoxaparin (LOVENOX) injection  40 mg Subcutaneous Q24H  . flecainide  50 mg Oral BID  . insulin aspart  0-15 Units Subcutaneous TID WC  . insulin aspart  0-5 Units Subcutaneous QHS  . [START ON 03/24/2019] insulin glargine  20 Units Subcutaneous Daily  . methimazole  15 mg Oral Daily  . potassium chloride SA  20 mEq Oral Daily  . simvastatin  10 mg Oral QHS  . sodium chloride flush  3 mL Intravenous Q12H   Continuous Infusions: . sodium chloride 75 mL/hr at 03/23/19 0504  . cefTRIAXone (ROCEPHIN)  IV 2 g (03/23/19 0330)  . diltiazem (CARDIZEM) infusion Stopped (03/22/19 1149)     LOS: 1 day     Cordelia Poche, MD [facility] 03/23/2019, 10:43 AM  If 7PM-7AM, please contact night-coverage [email or web address]

## 2019-03-23 NOTE — Progress Notes (Signed)
PHARMACY - PHYSICIAN COMMUNICATION CRITICAL VALUE ALERT - BLOOD CULTURE IDENTIFICATION (BCID)  Results for orders placed or performed during the hospital encounter of 03/21/19  Blood Culture ID Panel (Reflexed) (Collected: 03/22/2019 10:44 AM)  Result Value Ref Range   Enterococcus species NOT DETECTED NOT DETECTED   Listeria monocytogenes NOT DETECTED NOT DETECTED   Staphylococcus species NOT DETECTED NOT DETECTED   Staphylococcus aureus (BCID) NOT DETECTED NOT DETECTED   Streptococcus species NOT DETECTED NOT DETECTED   Streptococcus agalactiae NOT DETECTED NOT DETECTED   Streptococcus pneumoniae NOT DETECTED NOT DETECTED   Streptococcus pyogenes NOT DETECTED NOT DETECTED   Acinetobacter baumannii NOT DETECTED NOT DETECTED   Enterobacteriaceae species DETECTED (A) NOT DETECTED   Enterobacter cloacae complex NOT DETECTED NOT DETECTED   Escherichia coli DETECTED (A) NOT DETECTED   Klebsiella oxytoca NOT DETECTED NOT DETECTED   Klebsiella pneumoniae NOT DETECTED NOT DETECTED   Proteus species NOT DETECTED NOT DETECTED   Serratia marcescens NOT DETECTED NOT DETECTED   Carbapenem resistance NOT DETECTED NOT DETECTED   Haemophilus influenzae NOT DETECTED NOT DETECTED   Neisseria meningitidis NOT DETECTED NOT DETECTED   Pseudomonas aeruginosa NOT DETECTED NOT DETECTED   Candida albicans NOT DETECTED NOT DETECTED   Candida glabrata NOT DETECTED NOT DETECTED   Candida krusei NOT DETECTED NOT DETECTED   Candida parapsilosis NOT DETECTED NOT DETECTED   Candida tropicalis NOT DETECTED NOT DETECTED    Name of physician (or Provider) Contacted: [place]  Changes to prescribed antibiotics required:  Rocephin 2g IV q 24h   Arden Tinoco S. Alford Highland, PharmD, BCPS Clinical Staff Pharmacist [email or web address] Eilene Ghazi Stillinger 03/23/2019  2:10 AM

## 2019-03-23 NOTE — Progress Notes (Signed)
Progress Note  Patient Name: Christy Abbott Date of Encounter: 03/23/2019  Primary Cardiologist:   Larae Grooms, MD   Subjective   She is complaining of a right sided pleuritic chest pain.  Still SOB.   Inpatient Medications    Scheduled Meds: . carvedilol  12.5 mg Oral BID WC  . enoxaparin (LOVENOX) injection  40 mg Subcutaneous Q24H  . flecainide  50 mg Oral BID  . insulin aspart  0-15 Units Subcutaneous TID WC  . insulin aspart  0-5 Units Subcutaneous QHS  . [START ON 03/24/2019] insulin glargine  20 Units Subcutaneous Daily  . methimazole  15 mg Oral Daily  . potassium chloride SA  20 mEq Oral Daily  . simvastatin  10 mg Oral QHS  . sodium chloride flush  3 mL Intravenous Q12H   Continuous Infusions: . sodium chloride 75 mL/hr at 03/23/19 0504  . cefTRIAXone (ROCEPHIN)  IV 2 g (03/23/19 0330)  . diltiazem (CARDIZEM) infusion Stopped (03/22/19 1149)   PRN Meds: acetaminophen **OR** acetaminophen, albuterol, dextrose   Vital Signs    Vitals:   03/23/19 0035 03/23/19 0333 03/23/19 0428 03/23/19 0734  BP: (!) 110/59 (!) 102/57  114/63  Pulse: 93 81  80  Resp: 20 20  18   Temp: 99.1 F (37.3 C) 97.9 F (36.6 C)  98 F (36.7 C)  TempSrc: Oral Oral  Oral  SpO2: 90% 94%  96%  Weight:   67 kg   Height:        Intake/Output Summary (Last 24 hours) at 03/23/2019 1144 Last data filed at 03/23/2019 0506 Gross per 24 hour  Intake 1951.5 ml  Output 731 ml  Net 1220.5 ml   Filed Weights   03/21/19 2358 03/22/19 2000 03/23/19 0428  Weight: 70.3 kg 66.8 kg 67 kg    Telemetry    NSR - Personally Reviewed  ECG    NA - Personally Reviewed  Physical Exam   GEN: No acute distress.   Neck: No  JVD Cardiac: RRR, no murmurs, rubs, or gallops.  Respiratory:    Decreased breath sounds with diffuse scattered wheezing.  GI: Soft, nontender, non-distended  MS: No edema; No deformity. Neuro:  Nonfocal  Psych: Normal affect   Labs    Chemistry Recent Labs   Lab 03/22/19 0003 03/22/19 1640 03/23/19 0004 03/23/19 0501  NA 129* 132* 131* 132*  K 3.4* 3.6 3.4* 2.8*  CL 94* 105 102 102  CO2 17* 17* 17* 21*  GLUCOSE 413* 145* 234* 164*  BUN 14 15 16 17   CREATININE 1.00 0.64 0.72 0.69  CALCIUM 8.8* 7.7* 8.1* 7.9*  PROT 6.5  --   --   --   ALBUMIN 3.1*  --   --   --   AST 26  --   --   --   ALT 31  --   --   --   ALKPHOS 185*  --   --   --   BILITOT 1.8*  --   --   --   GFRNONAA 60* >60 >60 >60  GFRAA >60 >60 >60 >60  ANIONGAP 18* 10 12 9      Hematology Recent Labs  Lab 03/22/19 0003 03/22/19 0638 03/23/19 0004  WBC 13.8*  --  11.0*  RBC 5.40*  --  4.79  HGB 16.8* 16.0* 14.7  HCT 48.1* 47.0* 43.0  MCV 89.1  --  89.8  MCH 31.1  --  30.7  MCHC 34.9  --  34.2  RDW 12.8  --  12.9  PLT 158  --  130*    Cardiac EnzymesNo results for input(s): TROPONINI in the last 168 hours. No results for input(s): TROPIPOC in the last 168 hours.   BNPNo results for input(s): BNP, PROBNP in the last 168 hours.   DDimer  Recent Labs  Lab 03/22/19 0424  DDIMER 2.66*     Radiology    CT Angio Chest PE W and/or Wo Contrast  Result Date: 03/22/2019 CLINICAL DATA:  Shortness of breath and right lower lobe pain for 4 days EXAM: CT ANGIOGRAPHY CHEST WITH CONTRAST TECHNIQUE: Multidetector CT imaging of the chest was performed using the standard protocol during bolus administration of intravenous contrast. Multiplanar CT image reconstructions and MIPs were obtained to evaluate the vascular anatomy. CONTRAST:  142mL OMNIPAQUE IOHEXOL 350 MG/ML SOLN COMPARISON:  None. FINDINGS: Cardiovascular: --Pulmonary arteries: Contrast injection is sufficient to demonstrate satisfactory opacification of the pulmonary arteries to the segmental level, with attenuation of at least 200 HU at the main pulmonary artery. There is no pulmonary embolus. The main pulmonary artery is within normal limits for size. --Aorta: Limited opacification of the aorta due to bolus timing  optimization for the pulmonary arteries. The course and caliber of the aorta are normal. Conventional 3 vessel aortic branching pattern. There is mild aortic atherosclerosis. --Heart: Normal size. No pericardial effusion. Mediastinum/Nodes: No mediastinal, hilar or axillary lymphadenopathy. The visualized thyroid and thoracic esophageal course are unremarkable. Lungs/Pleura: Multiple nodular opacities in the right lung, largest of which is in the posterior right upper lobe measures approximately 1.5 cm. There is a 2 cm left parahilar nodule. No pleural effusion. Upper Abdomen: Contrast bolus timing is not optimized for evaluation of the abdominal organs. The visualized portions of the organs of the upper abdomen are normal. Musculoskeletal: No chest wall abnormality. No bony spinal canal stenosis. Review of the MIP images confirms the above findings. IMPRESSION: 1. No pulmonary embolus to the segmental level. 2. Multiple nodular opacities in both lungs, the largest of which measures 2.0 cm. These are concerning for malignancy. Non-contrast chest CT at 3-6 months is recommended. If the nodules are stable at time of repeat CT, then future CT at 18-24 months (from today's scan) is considered optional for low-risk patients, but is recommended for high-risk patients. This recommendation follows the consensus statement: Guidelines for Management of Incidental Pulmonary Nodules Detected on CT Images: From the Fleischner Society 2017; Radiology 2017; 284:228-243. 3. Aortic Atherosclerosis (ICD10-I70.0). Electronically Signed   By: Ulyses Jarred M.D.   On: 03/22/2019 06:05   DG Chest Portable 1 View  Result Date: 03/22/2019 CLINICAL DATA:  64 year old female with shortness of breath. EXAM: PORTABLE CHEST 1 VIEW COMPARISON:  Chest radiograph dated 06/01/2014 FINDINGS: Minimal bibasilar interstitial nodularity may represent interstitial prominence. Atypical infiltrate is not excluded. Clinical correlation is recommended. No  focal consolidation, pleural effusion, or pneumothorax. The cardiac silhouette is within normal limits. Coronary vascular calcification and atherosclerotic calcification of the aortic arch. No acute osseous pathology. IMPRESSION: 1. No focal consolidation. 2. Minimal bibasilar interstitial nodularity may represent interstitial prominence or atypical infection. Clinical correlation is recommended. Electronically Signed   By: Anner Crete M.D.   On: 03/22/2019 00:49   ECHOCARDIOGRAM COMPLETE  Result Date: 03/23/2019   ECHOCARDIOGRAM REPORT   Patient Name:   Christy Abbott Date of Exam: 03/22/2019 Medical Rec #:  [id number]        Height:       64.0 in  Accession #:    [id number]       Weight:       155.0 lb Date of Birth:  1955-03-28         BSA:          1.76 m Patient Age:    39 years         BP:           143/86 mmHg Patient Gender: F                HR:           100 bpm. Exam Location:  Inpatient Procedure: 2D Echo and Intracardiac Opacification Agent Indications:    Atrial Flutter 427.32/I48.92  History:        Patient has prior history of Echocardiogram examinations, most                 recent 12/22/2012. Risk Factors:Diabetes and Hypertension.  Sonographer:    Clayton Lefort RDCS (AE) Referring Phys: [id number] RONDELL A SMITH IMPRESSIONS  1. Definity contrast agent was given IV to delineate the left ventricular endocardial borders.  2. Left ventricular ejection fraction, by visual estimation, is 60 to 65%. The left ventricle has normal function. There is no left ventricular hypertrophy.  3. Left ventricular diastolic parameters are consistent with Grade I diastolic dysfunction (impaired relaxation).  4. The left ventricle has no regional wall motion abnormalities.  5. Global right ventricle has normal systolic function.The right ventricular size is normal.  6. Left atrial size was normal.  7. Right atrial size was normal.  8. The mitral valve is normal in structure. Trivial mitral valve regurgitation. No  evidence of mitral stenosis.  9. The tricuspid valve is normal in structure. 10. The aortic valve was not well visualized. Aortic valve regurgitation is not visualized. 11. The pulmonic valve was not well visualized. Pulmonic valve regurgitation is not visualized. 12. Aortic dilatation noted. 13. There is mild dilatation of the aortic root measuring 40 mm. 14. The inferior vena cava is dilated in size with >50% respiratory variability, suggesting right atrial pressure of 8 mmHg. 15. Technically difficult; definity used; normal LV function; grade 1 diastolic dysfunction; mildly dilated aortic root. FINDINGS  Left Ventricle: Left ventricular ejection fraction, by visual estimation, is 60 to 65%. The left ventricle has normal function. Definity contrast agent was given IV to delineate the left ventricular endocardial borders. The left ventricle has no regional wall motion abnormalities. There is no left ventricular hypertrophy. Left ventricular diastolic parameters are consistent with Grade I diastolic dysfunction (impaired relaxation). Normal left atrial pressure. Right Ventricle: The right ventricular size is normal. Global RV systolic function is has normal systolic function. Left Atrium: Left atrial size was normal in size. Right Atrium: Right atrial size was normal in size Pericardium: There is no evidence of pericardial effusion. Mitral Valve: The mitral valve is normal in structure. Trivial mitral valve regurgitation. No evidence of mitral valve stenosis by observation. Tricuspid Valve: The tricuspid valve is normal in structure. Tricuspid valve regurgitation is trivial. Aortic Valve: The aortic valve was not well visualized. Aortic valve regurgitation is not visualized. Pulmonic Valve: The pulmonic valve was not well visualized. Pulmonic valve regurgitation is not visualized. Pulmonic regurgitation is not visualized. Aorta: Aortic dilatation noted. There is mild dilatation of the aortic root measuring 40 mm.  Venous: The inferior vena cava is dilated in size with greater than 50% respiratory variability, suggesting right atrial pressure of 8 mmHg.  Additional Comments:  Technically difficult; definity used; normal LV function; grade 1 diastolic dysfunction; mildly dilated aortic root.  LEFT VENTRICLE PLAX 2D LVIDd:         4.17 cm LVIDs:         2.95 cm LV PW:         0.98 cm LV IVS:        1.26 cm LVOT diam:     2.00 cm LV SV:         44 ml LV SV Index:   24.29 LVOT Area:     3.14 cm  RIGHT VENTRICLE             IVC RV S prime:     14.40 cm/s  IVC diam: 2.33 cm TAPSE (M-mode): 2.2 cm LEFT ATRIUM             Index       RIGHT ATRIUM           Index LA diam:        2.50 cm 1.42 cm/m  RA Area:     12.70 cm LA Vol (A2C):   40.6 ml 23.13 ml/m RA Volume:   32.90 ml  18.74 ml/m LA Vol (A4C):   15.0 ml 8.54 ml/m LA Biplane Vol: 27.0 ml 15.38 ml/m  AORTIC VALVE LVOT Vmax:   106.44 cm/s LVOT Vmean:  74.600 cm/s LVOT VTI:    0.196 m  AORTA Ao Root diam: 3.40 cm Ao Asc diam:  4.00 cm  SHUNTS Systemic VTI:  0.20 m Systemic Diam: 2.00 cm  Kirk Ruths MD Electronically signed by Kirk Ruths MD Signature Date/Time: 03/23/2019/7:16:15 AM    Final     Cardiac Studies   ECHO:     1. Definity contrast agent was given IV to delineate the left ventricular endocardial borders.  2. Left ventricular ejection fraction, by visual estimation, is 60 to 65%. The left ventricle has normal function. There is no left ventricular hypertrophy.  3. Left ventricular diastolic parameters are consistent with Grade I diastolic dysfunction (impaired relaxation).  4. The left ventricle has no regional wall motion abnormalities.  5. Global right ventricle has normal systolic function.The right ventricular size is normal.  6. Left atrial size was normal.  7. Right atrial size was normal.  8. The mitral valve is normal in structure. Trivial mitral valve regurgitation. No evidence of mitral stenosis.  9. The tricuspid valve is normal in  structure. 10. The aortic valve was not well visualized. Aortic valve regurgitation is not visualized. 11. The pulmonic valve was not well visualized. Pulmonic valve regurgitation is not visualized. 12. Aortic dilatation noted. 13. There is mild dilatation of the aortic root measuring 40 mm. 14. The inferior vena cava is dilated in size with >50% respiratory variability, suggesting right atrial pressure of 8 mmHg. 15. Technically difficult; definity used; normal LV function; grade 1 diastolic dysfunction; mildly dilated aortic root.  Patient Profile     64 y.o. female with right sided pleuritic chest pain and dyspnea.  Being managed for possible URI and uncontrolled BS.  In the ED she had atrial flutter which broke after IV Cardizem.  She has had this history before treated with flecainide.   Assessment & Plan    ATRIAL FLUTTER:    Maintaining NSR.  Continue current therapy.  Again, given the isolated event during an acute illness I do not think she needs to be committed to [facility] at this time. No change in therapy.   DM:  A1c is 13.9.  Plan per primary team.   PULMONARY NODULES:   Per primary team.   TOBACCO ABUSE:  Educated.    HYPOKALEMIA:    Potassium is lower today.  She has 60 meq Kdur ordered.  I will add another 60 meq given how low the potassium is.    HYPERTHYROID:   TSH is low.  She is being treated with Tapazole.    For questions or updates, please contact [place] Please consult [email or web address] for contact info under Cardiology/STEMI.   Signed, Minus Breeding, MD  03/23/2019, 11:44 AM

## 2019-03-24 ENCOUNTER — Inpatient Hospital Stay (HOSPITAL_COMMUNITY): Payer: 59

## 2019-03-24 DIAGNOSIS — I4892 Unspecified atrial flutter: Secondary | ICD-10-CM

## 2019-03-24 LAB — GLUCOSE, CAPILLARY
Glucose-Capillary: 161 mg/dL — ABNORMAL HIGH (ref 70–99)
Glucose-Capillary: 153 mg/dL — ABNORMAL HIGH (ref 70–99)
Glucose-Capillary: 168 mg/dL — ABNORMAL HIGH (ref 70–99)
Glucose-Capillary: 160 mg/dL — ABNORMAL HIGH (ref 70–99)
Glucose-Capillary: 175 mg/dL — ABNORMAL HIGH (ref 70–99)
Glucose-Capillary: 186 mg/dL — ABNORMAL HIGH (ref 70–99)

## 2019-03-24 LAB — BASIC METABOLIC PANEL
Anion gap: 14 (ref 5–15)
Anion gap: 11 (ref 5–15)
BUN: 20 mg/dL (ref 8–23)
BUN: 19 mg/dL (ref 8–23)
CO2: 16 mmol/L — ABNORMAL LOW (ref 22–32)
CO2: 20 mmol/L — ABNORMAL LOW (ref 22–32)
Calcium: 8.1 mg/dL — ABNORMAL LOW (ref 8.9–10.3)
Calcium: 8.2 mg/dL — ABNORMAL LOW (ref 8.9–10.3)
Chloride: 102 mmol/L (ref 98–111)
Chloride: 102 mmol/L (ref 98–111)
Creatinine, Ser: 0.65 mg/dL (ref 0.44–1.00)
Creatinine, Ser: 0.65 mg/dL (ref 0.44–1.00)
GFR calc Af Amer: >60 mL/min (ref >60)
GFR calc Af Amer: >60 mL/min (ref >60)
GFR calc non Af Amer: >60 mL/min (ref >60)
GFR calc non Af Amer: >60 mL/min (ref >60)
Glucose, Bld: 174 mg/dL — ABNORMAL HIGH (ref 70–99)
Glucose, Bld: 200 mg/dL — ABNORMAL HIGH (ref 70–99)
Potassium: 4.3 mmol/L (ref 3.5–5.1)
Potassium: 4.2 mmol/L (ref 3.5–5.1)
Sodium: 132 mmol/L — ABNORMAL LOW (ref 135–145)
Sodium: 133 mmol/L — ABNORMAL LOW (ref 135–145)

## 2019-03-24 LAB — PROCALCITONIN: Procalcitonin: 3.41 ng/mL

## 2019-03-24 IMAGING — CT CT ABD-PELV W/O CM
2 of 4 series · 16 of 46 positions shown, 18 images · non-contrast
Comparison: Chest CT dated [DATE].

CLINICAL DATA: 63-year-old female with abdominal pain.

EXAM:
CT ABDOMEN AND PELVIS WITHOUT CONTRAST
TECHNIQUE: Multidetector CT imaging of the abdomen and pelvis was performed
following the standard protocol without IV contrast.

[Series 2: a/p w/o 5mm · axial · non-contrast · 0.98mm/px · z∈[+723,+1203]mm · 13 of 106 slices shown, 15 images]
[im 5/106  soft-tissue]
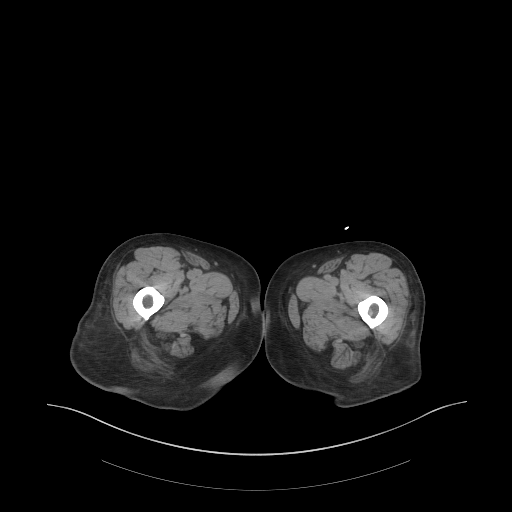
[im 5/106  bone]
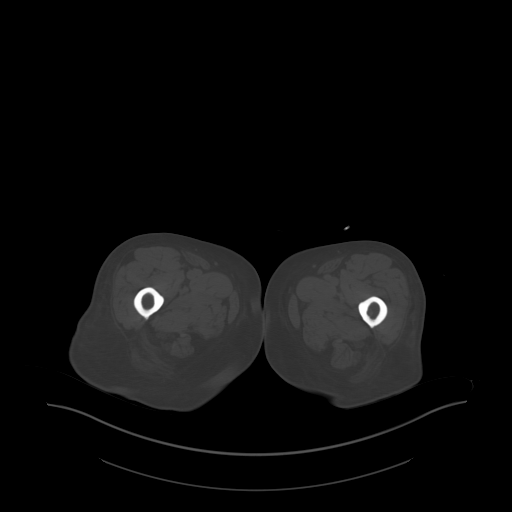
[im 13/106  soft-tissue]
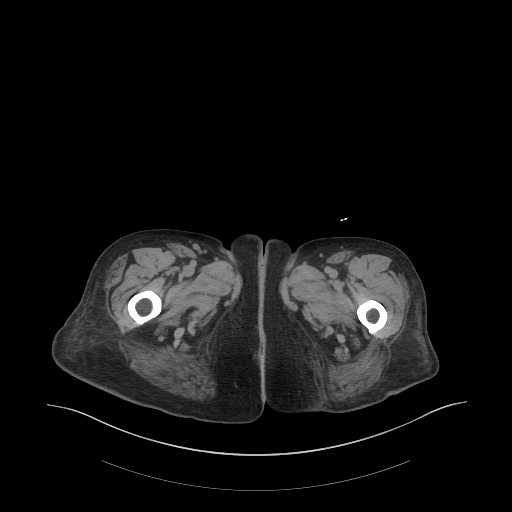
[im 22/106  soft-tissue]
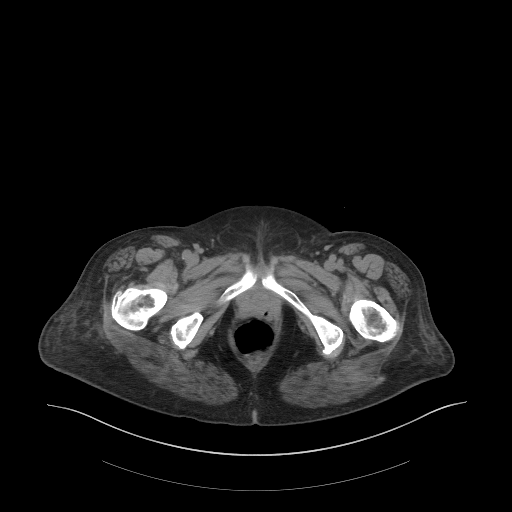
[im 30/106  soft-tissue]
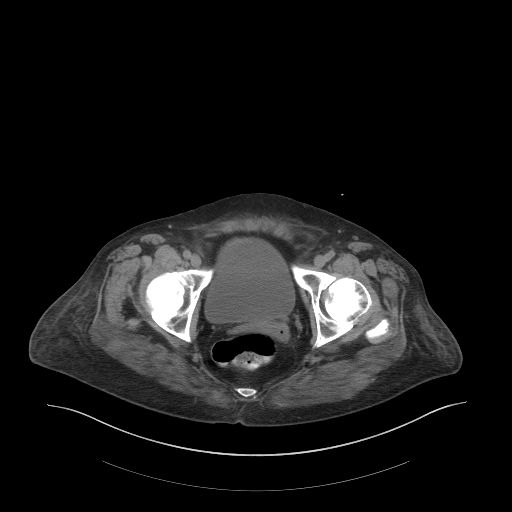
[im 38/106  soft-tissue]
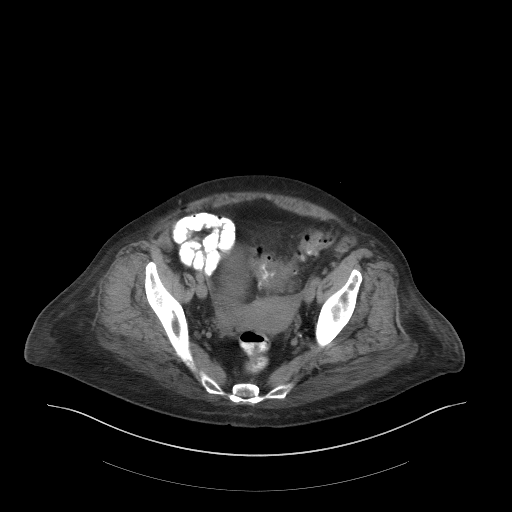
[im 47/106  soft-tissue]
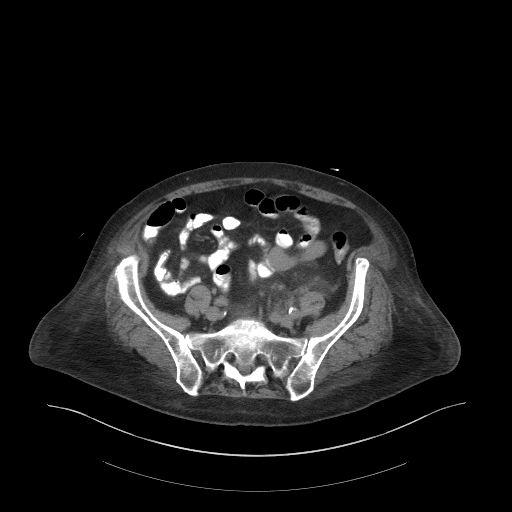
[im 55/106  soft-tissue]
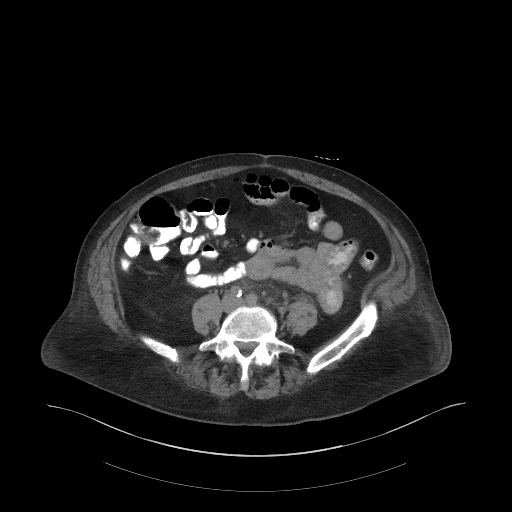
[im 59/106  soft-tissue]
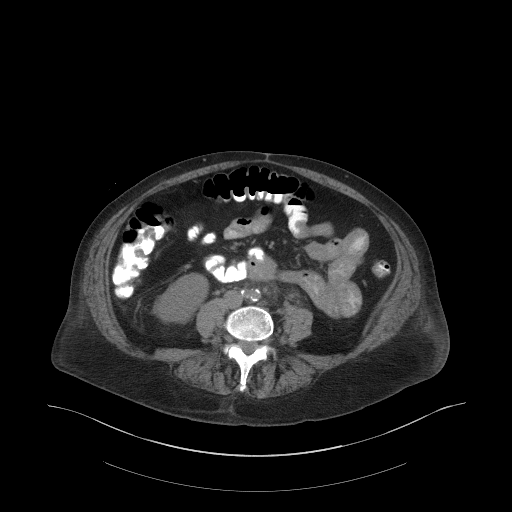
[im 68/106  soft-tissue]
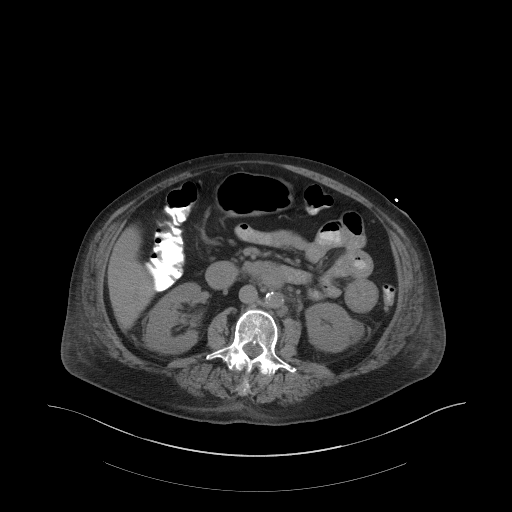
[im 68/106  bone]
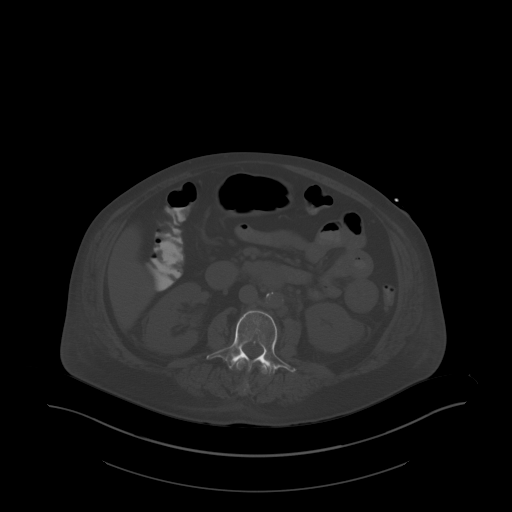
[im 76/106  soft-tissue]
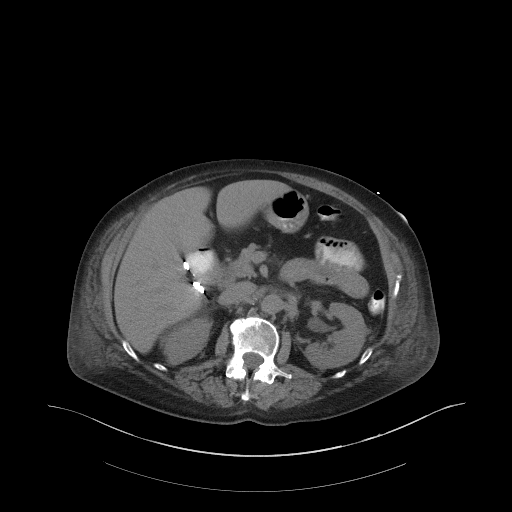
[im 85/106  soft-tissue]
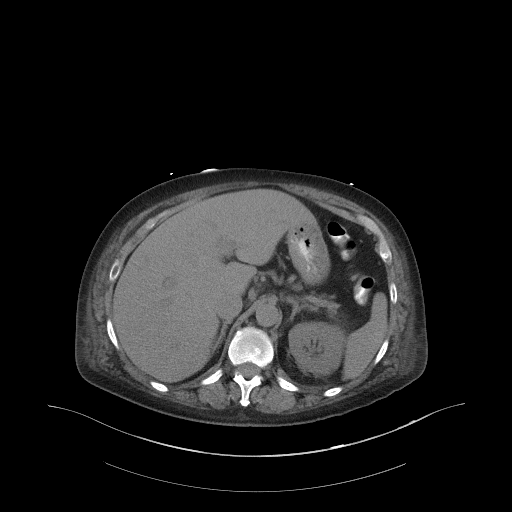
[im 93/106  soft-tissue]
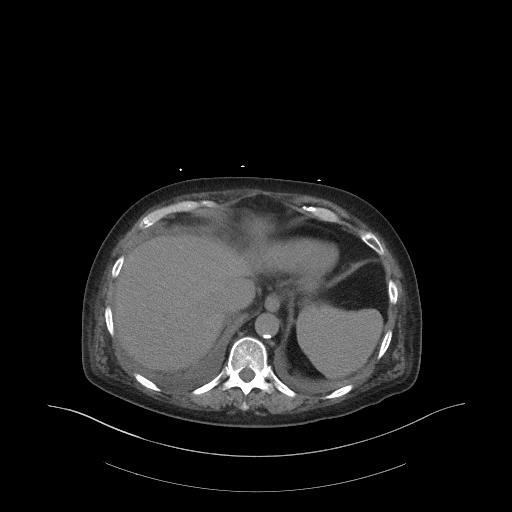
[im 101/106  soft-tissue]
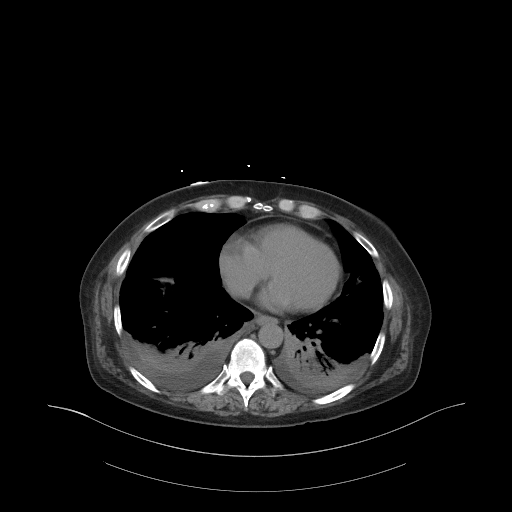

[Series 5: a/p w/o cor · coronal · non-contrast · 0.93mm/px · 3 of 171 slices shown]
[im 57/171  soft-tissue]
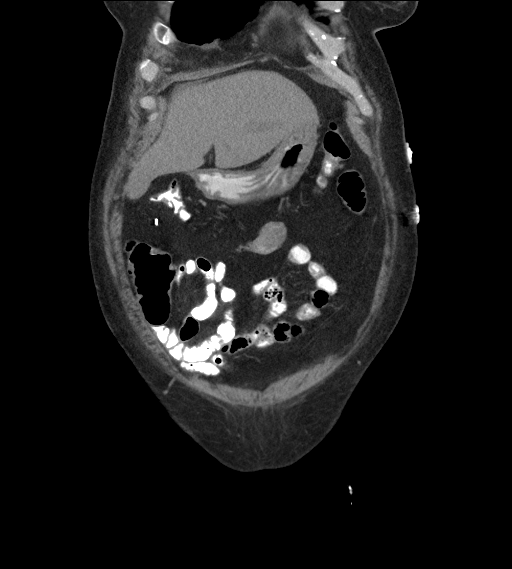
[im 76/171  soft-tissue]
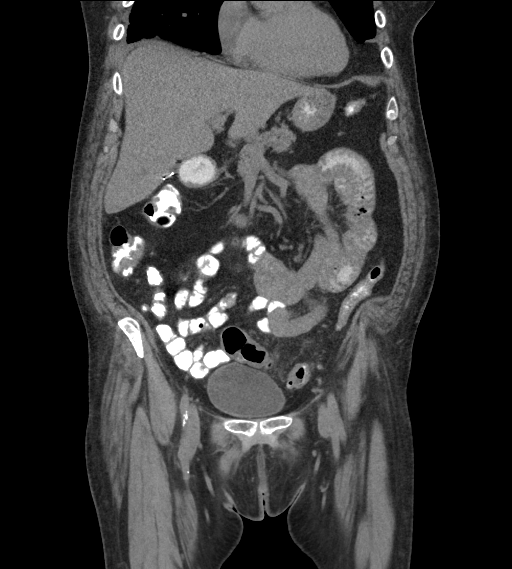
[im 95/171  soft-tissue]
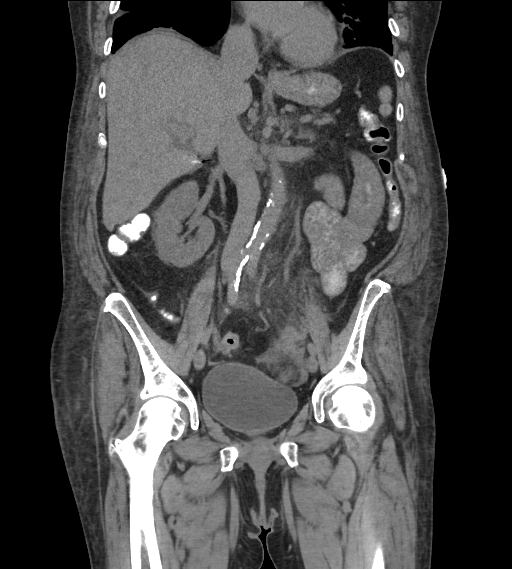

[16 of 46 positions shown; findings below may reference images not displayed]

FINDINGS: Evaluation of this exam is limited in the absence of intravenous
contrast.

Lower chest: Partially visualized small bilateral pleural effusions,
new since the study of [DATE]. Bilateral lower lobe partial
consolidative change with air bronchograms may represent atelectasis
or pneumonia. Clinical correlation and follow-up recommended.
Additional patchy areas of nodular and hazy density noted in the
lingula and right middle lobe.

There is no intra-abdominal free air or free fluid.

Hepatobiliary: The liver is grossly unremarkable. Cholecystectomy.
There is mild periportal edema. No retained calcified stone noted in
the central CBD.

Pancreas: Mildly atrophic pancreas. No active inflammatory changes
or dilatation of the main pancreatic duct.

Spleen: Normal in size without focal abnormality.

Adrenals/Urinary Tract: The adrenal glands are unremarkable. There
is no hydronephrosis or nephrolithiasis on either side. The
visualized ureters and urinary bladder appear unremarkable.

Stomach/Bowel: There is sigmoid diverticulosis. There is segmental
thickening and inflammatory changes of the sigmoid colon which may
represent acute diverticulitis versus mild segmental sigmoid
colitis. Clinical correlation is recommended. No diverticular
abscess or perforation. There is extension of the inflammatory
process superiorly along the mesenteric vein with infiltration of
the left retroperitoneal fat. If there is clinical concern for
ischemia further evaluation with CT angiography is recommended.
There is no bowel obstruction. The appendix is normal.

Vascular/Lymphatic: Advanced aortoiliac atherosclerotic disease. The
IVC is unremarkable. No portal venous gas. Several small mildly
rounded retroperitoneal lymph nodes, likely reactive. No adenopathy.

Reproductive: The uterus and ovaries are grossly unremarkable.

Other: Mild subcutaneous edema.

Musculoskeletal: No acute or significant osseous findings.
IMPRESSION: 1. Sigmoid diverticulitis versus possible colitis. Clinical
correlation is recommended. No drainable fluid collection abscess.
No pneumatosis or portal venous gas. If there is clinical concern
for an ischemic process, further evaluation with CT angiography
recommended.
2. No bowel obstruction.  Normal appendix.
3. Small bilateral pleural effusions and bilateral atelectasis or
infiltrate, new since the prior CT. Clinical correlation and
follow-up recommended.
4.  Aortic Atherosclerosis ([X0]-[X0]).

## 2019-03-24 MED ORDER — LIVING WELL WITH DIABETES BOOK
Freq: Once | Status: AC
  Filled 2019-03-24: qty 1

## 2019-03-24 MED ORDER — INSULIN STARTER KIT- PEN NEEDLES (ENGLISH)
1.0000 | Freq: Once | Status: AC
  Administered 2019-03-24: 1
  Filled 2019-03-24: qty 1

## 2019-03-24 NOTE — Progress Notes (Signed)
PROGRESS NOTE    Christy Abbott  SNK:[id number] DOB: December 25, 1955 DOA: 03/21/2019 PCP: [name], NP   Brief Narrative: Christy Abbott is a 64 y.o. female with medical history significant of hypertension, DM type II, atrial flutter seen previously by Dr. Lovena Le, hyperthyroidism, and neuroendocrine tumor in 2016 treated at [facility]. Patient presented secondary to right sided chest pain and found to have concern for sepsis with a possible viral illness source. Blood cultures significant for E. Coli.   Assessment & Plan:   Active Problems:   Tobacco use disorder   Hypokalemia   Hyperthyroidism   DKA, type 2 (HCC)   SVT (supraventricular tachycardia) (HCC)   Viral illness   Sepsis ([place])   Pulmonary nodules   Sepsis Present on admission. Secondary to E. Coli bacteremia.  E. Coli bacteremia Unknown source. Urinalysis was not consistent with UTI. Associated RUQ in setting of cholecystectomy.  -Continue Ceftriaxone -Blood cultures pending for sensitivities  RUQ pain Possibly related to biliary system. Associated bacteremia. Pain is more diffuse today. RUQ ultrasound unremarkable -CT abdomen/pelvis -Morphine IV prn  DKA Diabetes mellitus, type 2 Likely precipitated by acute infection. Patient managed on insulin drip and is now transitioned to subcutaneous insulin. Patient does not take any insulin as an outpatient. On Amaryl and Metformin as an outpatient. Last hemoglobin A1C was 10.9% in 05/2018. CO2 trended down -Continue Lantus 20 units and SSI -Obtain hemoglobin A1C -BMP this afternoon  History of SVT History of Atrial flutter/fibrillation Rate controlled. Normal rhythm -Continue flecainide and Coreg  Pulmonary nodules Patient is a smoker. No previous knowledge of an abnormal lung exam. Will need outpatient follow-up imaging.  Hyperthyroidism -Continue methimazole  Tobacco use Cessation discussed this admission   DVT prophylaxis: Lovenox Code Status:    Code Status: Full Code Family Communication: None at bedside. Will call daughter later today. Disposition Plan: Discharge pending culture data and improvement of pain in addition to continued workup of pain   Consultants:   None  Procedures:   None  Antimicrobials:  Ceftriaxone    Subjective: Continued abdominal pain, worse in RUQ.   Objective: Vitals:   03/23/19 2237 03/24/19 0119 03/24/19 0335 03/24/19 0847  BP: 107/71 111/73 109/73 114/68  Pulse: 86 84 85 82  Resp:  18 (!) 21 18  Temp:  97.9 F (36.6 C) (!) 97.5 F (36.4 C) 98.1 F (36.7 C)  TempSrc:  Oral Oral Oral  SpO2:  94% 94% 93%  Weight:   66.7 kg   Height:        Intake/Output Summary (Last 24 hours) at 03/24/2019 0908 Last data filed at 03/24/2019 0357 Gross per 24 hour  Intake 460 ml  Output 375 ml  Net 85 ml   Filed Weights   03/22/19 2000 03/23/19 0428 03/24/19 0335  Weight: 66.8 kg 67 kg 66.7 kg    Examination:  General exam: Appears calm and comfortable Respiratory system: Clear to auscultation. Respiratory effort normal. Cardiovascular system: S1 & S2 heard, RRR. No murmurs, rubs, gallops or clicks. Gastrointestinal system: Abdomen is mildly distended, soft and tender in RUQ, LLQ. No organomegaly or masses felt. Normal bowel sounds heard. Central nervous system: Alert and oriented. No focal neurological deficits. Extremities: No edema. No calf tenderness Skin: No cyanosis. No rashes Psychiatry: Judgement and insight appear normal. Mood & affect appropriate.      Data Reviewed: I have personally reviewed following labs and imaging studies  CBC: Recent Labs  Lab 03/22/19 0003 03/22/19 9379 03/23/19 0004  WBC 13.8*  --  11.0*  NEUTROABS 11.6*  --   --   HGB 16.8* 16.0* 14.7  HCT 48.1* 47.0* 43.0  MCV 89.1  --  89.8  PLT 158  --  443*   Basic Metabolic Panel: Recent Labs  Lab 03/22/19 1008 03/22/19 1640 03/23/19 0004 03/23/19 0501 03/24/19 0455  NA 132* 132* 131* 132*  132*  K 3.0* 3.6 3.4* 2.8* 4.3  CL 102 105 102 102 102  CO2 16* 17* 17* 21* 16*  GLUCOSE 211* 145* 234* 164* 174*  BUN 11 15 16 17 20   CREATININE 0.85 0.64 0.72 0.69 0.65  CALCIUM 7.9* 7.7* 8.1* 7.9* 8.1*  MG 1.4*  --  1.6*  --   --    GFR: Estimated Creatinine Clearance: 67.6 mL/min (by C-G formula based on SCr of 0.65 mg/dL). Liver Function Tests: Recent Labs  Lab 03/22/19 0003  AST 26  ALT 31  ALKPHOS 185*  BILITOT 1.8*  PROT 6.5  ALBUMIN 3.1*   Recent Labs  Lab 03/22/19 0424  LIPASE 18   No results for input(s): AMMONIA in the last 168 hours. Coagulation Profile: Recent Labs  Lab 03/22/19 1008  INR 1.0   Cardiac Enzymes: No results for input(s): CKTOTAL, CKMB, CKMBINDEX, TROPONINI in the last 168 hours. BNP (last 3 results) No results for input(s): PROBNP in the last 8760 hours. HbA1C: Recent Labs    03/23/19 0004  HGBA1C 13.9*   CBG: Recent Labs  Lab 03/23/19 1623 03/23/19 2119 03/24/19 0121 03/24/19 0338 03/24/19 0738  GLUCAP 158* 148* 161* 153* 168*   Lipid Profile: No results for input(s): CHOL, HDL, LDLCALC, TRIG, CHOLHDL, LDLDIRECT in the last 72 hours. Thyroid Function Tests: Recent Labs    03/22/19 0910  TSH 0.132*  FREET4 1.13*  T3FREE 1.6*   Anemia Panel: No results for input(s): VITAMINB12, FOLATE, FERRITIN, TIBC, IRON, RETICCTPCT in the last 72 hours. Sepsis Labs: Recent Labs  Lab 03/22/19 1008 03/22/19 2025  PROCALCITON 12.90  --   LATICACIDVEN 1.6 1.0    Recent Results (from the past 240 hour(s))  Respiratory Panel by RT PCR (Flu A&B, Covid) - Nasopharyngeal Swab     Status: None   Collection Time: 03/22/19  4:24 AM   Specimen: Nasopharyngeal Swab  Result Value Ref Range Status   SARS Coronavirus 2 by RT PCR NEGATIVE NEGATIVE Final    Comment: (NOTE) SARS-CoV-2 target nucleic acids are NOT DETECTED. The SARS-CoV-2 RNA is generally detectable in upper respiratoy specimens during the acute phase of infection. The  lowest concentration of SARS-CoV-2 viral copies this assay can detect is 131 copies/mL. A negative result does not preclude SARS-Cov-2 infection and should not be used as the sole basis for treatment or other patient management decisions. A negative result may occur with  improper specimen collection/handling, submission of specimen other than nasopharyngeal swab, presence of viral mutation(s) within the areas targeted by this assay, and inadequate number of viral copies (<131 copies/mL). A negative result must be combined with clinical observations, patient history, and epidemiological information. The expected result is Negative. Fact Sheet for Patients:  PinkCheek.be Fact Sheet for Healthcare Providers:  GravelBags.it This test is not yet ap proved or cleared by the [place] FDA and  has been authorized for detection and/or diagnosis of SARS-CoV-2 by FDA under an Emergency Use Authorization (EUA). This EUA will remain  in effect (meaning this test can be used) for the duration of the COVID-19 declaration under Section 564(b)(1) of the  Act, 21 U.S.C. section 360bbb-3(b)(1), unless the authorization is terminated or revoked sooner.    Influenza A by PCR NEGATIVE NEGATIVE Final   Influenza B by PCR NEGATIVE NEGATIVE Final    Comment: (NOTE) The Xpert Xpress SARS-CoV-2/FLU/RSV assay is intended as an aid in  the diagnosis of influenza from Nasopharyngeal swab specimens and  should not be used as a sole basis for treatment. Nasal washings and  aspirates are unacceptable for Xpert Xpress SARS-CoV-2/FLU/RSV  testing. Fact Sheet for Patients: PinkCheek.be Fact Sheet for Healthcare Providers: GravelBags.it This test is not yet approved or cleared by the [place] FDA and  has been authorized for detection and/or diagnosis of SARS-CoV-2 by  FDA under an Emergency  Use Authorization (EUA). This EUA will remain  in effect (meaning this test can be used) for the duration of the  Covid-19 declaration under Section 564(b)(1) of the Act, 21  U.S.C. section 360bbb-3(b)(1), unless the authorization is  terminated or revoked. Performed at [facility], [place] [address], [place], [place] [address]   Respiratory Panel by PCR     Status: None   Collection Time: 03/22/19  8:53 AM   Specimen: Nasopharyngeal Swab; Respiratory  Result Value Ref Range Status   Adenovirus NOT DETECTED NOT DETECTED Final   Coronavirus 229E NOT DETECTED NOT DETECTED Final    Comment: (NOTE) The Coronavirus on the Respiratory Panel, DOES NOT test for the novel  Coronavirus (2019 nCoV)    Coronavirus HKU1 NOT DETECTED NOT DETECTED Final   Coronavirus NL63 NOT DETECTED NOT DETECTED Final   Coronavirus OC43 NOT DETECTED NOT DETECTED Final   Metapneumovirus NOT DETECTED NOT DETECTED Final   Rhinovirus / Enterovirus NOT DETECTED NOT DETECTED Final   Influenza A NOT DETECTED NOT DETECTED Final   Influenza B NOT DETECTED NOT DETECTED Final   Parainfluenza Virus 1 NOT DETECTED NOT DETECTED Final   Parainfluenza Virus 2 NOT DETECTED NOT DETECTED Final   Parainfluenza Virus 3 NOT DETECTED NOT DETECTED Final   Parainfluenza Virus 4 NOT DETECTED NOT DETECTED Final   Respiratory Syncytial Virus NOT DETECTED NOT DETECTED Final   Bordetella pertussis NOT DETECTED NOT DETECTED Final   Chlamydophila pneumoniae NOT DETECTED NOT DETECTED Final   Mycoplasma pneumoniae NOT DETECTED NOT DETECTED Final    Comment: Performed at [facility], [place]. [address] [place], [place] [address]  Culture, blood (x 2)     Status: Abnormal (Preliminary result)   Collection Time: 03/22/19 10:44 AM   Specimen: BLOOD LEFT HAND  Result Value Ref Range Status   Specimen Description BLOOD LEFT HAND  Final   Special Requests   Final    BOTTLES DRAWN AEROBIC AND ANAEROBIC Blood Culture adequate volume    Culture  Setup Time   Final    IN BOTH AEROBIC AND ANAEROBIC BOTTLES GRAM NEGATIVE RODS CRITICAL VALUE NOTED.  VALUE IS CONSISTENT WITH PREVIOUSLY REPORTED AND CALLED VALUE. Performed at [facility], [place] [address], [place],  [address]    Culture ESCHERICHIA COLI (A)  Final   Report Status PENDING  Incomplete  Culture, blood (x 2)     Status: Abnormal (Preliminary result)   Collection Time: 03/22/19 10:44 AM   Specimen: BLOOD RIGHT HAND  Result Value Ref Range Status   Specimen Description BLOOD RIGHT HAND  Final   Special Requests   Final    BOTTLES DRAWN AEROBIC ONLY Blood Culture adequate volume   Culture  Setup Time   Final  AEROBIC BOTTLE ONLY GRAM NEGATIVE RODS CRITICAL RESULT CALLED TO, READ BACK BY AND VERIFIED WITH: CRYSTAL ROBERTSON PHARM @ 0205 ON 03/23/19 BY ROBINSON Z.     Culture (A)  Final    ESCHERICHIA COLI SUSCEPTIBILITIES TO FOLLOW Performed at [facility], [place] [address], [place], [place] [address]    Report Status PENDING  Incomplete  Blood Culture ID Panel (Reflexed)     Status: Abnormal   Collection Time: 03/22/19 10:44 AM  Result Value Ref Range Status   Enterococcus species NOT DETECTED NOT DETECTED Final   Listeria monocytogenes NOT DETECTED NOT DETECTED Final   Staphylococcus species NOT DETECTED NOT DETECTED Final   Staphylococcus aureus (BCID) NOT DETECTED NOT DETECTED Final   Streptococcus species NOT DETECTED NOT DETECTED Final   Streptococcus agalactiae NOT DETECTED NOT DETECTED Final   Streptococcus pneumoniae NOT DETECTED NOT DETECTED Final   Streptococcus pyogenes NOT DETECTED NOT DETECTED Final   Acinetobacter baumannii NOT DETECTED NOT DETECTED Final   Enterobacteriaceae species DETECTED (A) NOT DETECTED Final    Comment: Enterobacteriaceae represent a large family of gram-negative bacteria, not a single organism. CRITICAL RESULT CALLED TO, READ BACK BY AND VERIFIED WITH: CRYSTAL ROBERTSON PHARM @ 0205 ON 03/23/19 BY  ROBINSON Z.     Enterobacter cloacae complex NOT DETECTED NOT DETECTED Final   Escherichia coli DETECTED (A) NOT DETECTED Final    Comment: CRITICAL RESULT CALLED TO, READ BACK BY AND VERIFIED WITH: CRYSTAL ROBERTSON PHARM @ 0205 ON 03/23/19 BY ROBINSON Z.     Klebsiella oxytoca NOT DETECTED NOT DETECTED Final   Klebsiella pneumoniae NOT DETECTED NOT DETECTED Final   Proteus species NOT DETECTED NOT DETECTED Final   Serratia marcescens NOT DETECTED NOT DETECTED Final   Carbapenem resistance NOT DETECTED NOT DETECTED Final   Haemophilus influenzae NOT DETECTED NOT DETECTED Final   Neisseria meningitidis NOT DETECTED NOT DETECTED Final   Pseudomonas aeruginosa NOT DETECTED NOT DETECTED Final   Candida albicans NOT DETECTED NOT DETECTED Final   Candida glabrata NOT DETECTED NOT DETECTED Final   Candida krusei NOT DETECTED NOT DETECTED Final   Candida parapsilosis NOT DETECTED NOT DETECTED Final   Candida tropicalis NOT DETECTED NOT DETECTED Final    Comment: Performed at [facility], [place]. [address], [place], [place] [address]  C difficile quick scan w PCR reflex     Status: None   Collection Time: 03/23/19  5:37 PM   Specimen: STOOL  Result Value Ref Range Status   C Diff antigen NEGATIVE NEGATIVE Final   C Diff toxin NEGATIVE NEGATIVE Final   C Diff interpretation No C. difficile detected.  Final    Comment: Performed at [facility], [place] [address], [place], [place] [address]         Radiology Studies: ECHOCARDIOGRAM COMPLETE  Result Date: 03/23/2019   ECHOCARDIOGRAM REPORT   Patient Name:   Christy Abbott Date of Exam: 03/22/2019 Medical Rec #:  [id number]        Height:       64.0 in Accession #:    [id number]       Weight:       155.0 lb Date of Birth:  10/04/1955         BSA:          1.76 m Patient Age:    89 years         BP:           143/86  mmHg Patient Gender: F                HR:           100 bpm. Exam Location:  Inpatient Procedure: 2D Echo and Intracardiac  Opacification Agent Indications:    Atrial Flutter 427.32/I48.92  History:        Patient has prior history of Echocardiogram examinations, most                 recent 12/22/2012. Risk Factors:Diabetes and Hypertension.  Sonographer:    Clayton Lefort RDCS (AE) Referring Phys: [id number] RONDELL A SMITH IMPRESSIONS  1. Definity contrast agent was given IV to delineate the left ventricular endocardial borders.  2. Left ventricular ejection fraction, by visual estimation, is 60 to 65%. The left ventricle has normal function. There is no left ventricular hypertrophy.  3. Left ventricular diastolic parameters are consistent with Grade I diastolic dysfunction (impaired relaxation).  4. The left ventricle has no regional wall motion abnormalities.  5. Global right ventricle has normal systolic function.The right ventricular size is normal.  6. Left atrial size was normal.  7. Right atrial size was normal.  8. The mitral valve is normal in structure. Trivial mitral valve regurgitation. No evidence of mitral stenosis.  9. The tricuspid valve is normal in structure. 10. The aortic valve was not well visualized. Aortic valve regurgitation is not visualized. 11. The pulmonic valve was not well visualized. Pulmonic valve regurgitation is not visualized. 12. Aortic dilatation noted. 13. There is mild dilatation of the aortic root measuring 40 mm. 14. The inferior vena cava is dilated in size with >50% respiratory variability, suggesting right atrial pressure of 8 mmHg. 15. Technically difficult; definity used; normal LV function; grade 1 diastolic dysfunction; mildly dilated aortic root. FINDINGS  Left Ventricle: Left ventricular ejection fraction, by visual estimation, is 60 to 65%. The left ventricle has normal function. Definity contrast agent was given IV to delineate the left ventricular endocardial borders. The left ventricle has no regional wall motion abnormalities. There is no left ventricular hypertrophy. Left ventricular  diastolic parameters are consistent with Grade I diastolic dysfunction (impaired relaxation). Normal left atrial pressure. Right Ventricle: The right ventricular size is normal. Global RV systolic function is has normal systolic function. Left Atrium: Left atrial size was normal in size. Right Atrium: Right atrial size was normal in size Pericardium: There is no evidence of pericardial effusion. Mitral Valve: The mitral valve is normal in structure. Trivial mitral valve regurgitation. No evidence of mitral valve stenosis by observation. Tricuspid Valve: The tricuspid valve is normal in structure. Tricuspid valve regurgitation is trivial. Aortic Valve: The aortic valve was not well visualized. Aortic valve regurgitation is not visualized. Pulmonic Valve: The pulmonic valve was not well visualized. Pulmonic valve regurgitation is not visualized. Pulmonic regurgitation is not visualized. Aorta: Aortic dilatation noted. There is mild dilatation of the aortic root measuring 40 mm. Venous: The inferior vena cava is dilated in size with greater than 50% respiratory variability, suggesting right atrial pressure of 8 mmHg.  Additional Comments: Technically difficult; definity used; normal LV function; grade 1 diastolic dysfunction; mildly dilated aortic root.  LEFT VENTRICLE PLAX 2D LVIDd:         4.17 cm LVIDs:         2.95 cm LV PW:         0.98 cm LV IVS:        1.26 cm LVOT diam:     2.00 cm  LV SV:         44 ml LV SV Index:   24.29 LVOT Area:     3.14 cm  RIGHT VENTRICLE             IVC RV S prime:     14.40 cm/s  IVC diam: 2.33 cm TAPSE (M-mode): 2.2 cm LEFT ATRIUM             Index       RIGHT ATRIUM           Index LA diam:        2.50 cm 1.42 cm/m  RA Area:     12.70 cm LA Vol (A2C):   40.6 ml 23.13 ml/m RA Volume:   32.90 ml  18.74 ml/m LA Vol (A4C):   15.0 ml 8.54 ml/m LA Biplane Vol: 27.0 ml 15.38 ml/m  AORTIC VALVE LVOT Vmax:   106.44 cm/s LVOT Vmean:  74.600 cm/s LVOT VTI:    0.196 m  AORTA Ao Root diam:  3.40 cm Ao Asc diam:  4.00 cm  SHUNTS Systemic VTI:  0.20 m Systemic Diam: 2.00 cm  Kirk Ruths MD Electronically signed by Kirk Ruths MD Signature Date/Time: 03/23/2019/7:16:15 AM    Final    US Abdomen Limited RUQ  Result Date: 03/23/2019 CLINICAL DATA:  Right upper quadrant pain EXAM: ULTRASOUND ABDOMEN LIMITED RIGHT UPPER QUADRANT COMPARISON:  None. FINDINGS: Gallbladder: Cholecystectomy. Common bile duct: Diameter: 7.7 mm. Liver: No focal lesion identified. Mildly increased parenchymal echogenicity. There is intrahepatic biliary dilatation. Portal vein is patent on color Doppler imaging with normal direction of blood flow towards the liver. Other: None. IMPRESSION: Mildly increased liver echogenicity likely reflecting steatosis. Mild intrahepatic and extrahepatic biliary dilatation probably related to post cholecystectomy state. Electronically Signed   By: Macy Mis M.D.   On: 03/23/2019 14:42        Scheduled Meds:  carvedilol  12.5 mg Oral BID WC   enoxaparin (LOVENOX) injection  40 mg Subcutaneous Q24H   flecainide  50 mg Oral BID   insulin aspart  0-15 Units Subcutaneous TID WC   insulin aspart  0-5 Units Subcutaneous QHS   insulin glargine  20 Units Subcutaneous Daily   methimazole  15 mg Oral Daily   potassium chloride SA  20 mEq Oral Daily   simvastatin  10 mg Oral QHS   sodium chloride flush  3 mL Intravenous Q12H   Continuous Infusions:  sodium chloride 75 mL/hr at 03/23/19 1849   cefTRIAXone (ROCEPHIN)  IV 2 g (03/24/19 0357)   diltiazem (CARDIZEM) infusion Stopped (03/22/19 1149)     LOS: 2 days     Cordelia Poche, MD [facility] 03/24/2019, 9:08 AM  If 7PM-7AM, please contact night-coverage [email or web address]

## 2019-03-24 NOTE — Progress Notes (Signed)
Inpatient Diabetes Program Recommendations  AACE/ADA: New Consensus Statement on Inpatient Glycemic Control (2015)  Target Ranges:  Prepandial:   less than 140 mg/dL      Peak postprandial:   less than 180 mg/dL (1-2 hours)      Critically ill patients:  140 - 180 mg/dL   Lab Results  Component Value Date   GLUCAP 160 (H) 03/24/2019   HGBA1C 13.9 (H) 03/23/2019    Review of Glycemic Control Results for [place], Zenda S (MRN [id number]) as of 03/24/2019 13:44  Ref. Range 03/23/2019 21:19 03/24/2019 01:21 03/24/2019 03:38 03/24/2019 07:38 03/24/2019 11:03  Glucose-Capillary Latest Ref Range: 70 - 99 mg/dL 148 (H) 161 (H) 153 (H) 168 (H) 160 (H)   Diabetes history: DM2 Outpatient Diabetes medications: Amaryl 4 mg bid + Met 1 gm bid. Current orders for Inpatient glycemic control: :Lantus 20 units + Novolog moderate correction tid + hs 0-5 units  Inpatient Diabetes Program Recommendations:   Patient states she has never taken insulin @ home, only while admitted into the hospital. Patient's daughter with patient @ bedside and shared that daughter is type 1 diabetic. Patient has been taking her oral diabetes medications regularly, but not checking her CBGs regularly. Spoke with pt and daughter about new diagnosis A1C results with them 13.9 (average blood glucose of 352 over the past 2-3 months) and explained what an A1C is, basic pathophysiology of DM Type 2, basic home care, basic diabetes diet nutrition principles, importance of checking CBGs and maintaining good CBG control to prevent long-term and short-term complications. Reviewed signs and symptoms of hyperglycemia and hypoglycemia and how to treat hypoglycemia at home. Also reviewed blood sugar goals at home.  RNs to provide ongoing basic DM education at bedside with this patient. Have ordered educational booklet and insulin starter kit. Patient not feeling well and did not feel well enough to teach insulin administration. Nurses please start  insulin teaching and allow patient to give own injections as she is able to. Patient states she has watched her daughter take her insulin and doesn't mind taking insulin.  Thank you, Judy E. Hanks, RN, MSN, CDE  Diabetes Coordinator Inpatient Glycemic Control Team Team Pager #[phone] (8am-5pm) 03/24/2019 1:56 PM   

## 2019-03-24 NOTE — Progress Notes (Addendum)
Progress Note  Patient Name: Christy Abbott Date of Encounter: 03/24/2019  Primary Cardiologist:   Larae Grooms, MD   Subjective   Does not feel well but stable from cardiopulmonary standpoint.   Inpatient Medications    Scheduled Meds: . carvedilol  12.5 mg Oral BID WC  . enoxaparin (LOVENOX) injection  40 mg Subcutaneous Q24H  . flecainide  50 mg Oral BID  . insulin aspart  0-15 Units Subcutaneous TID WC  . insulin aspart  0-5 Units Subcutaneous QHS  . insulin glargine  20 Units Subcutaneous Daily  . methimazole  15 mg Oral Daily  . potassium chloride SA  20 mEq Oral Daily  . simvastatin  10 mg Oral QHS  . sodium chloride flush  3 mL Intravenous Q12H   Continuous Infusions: . sodium chloride 75 mL/hr at 03/23/19 1849  . cefTRIAXone (ROCEPHIN)  IV 2 g (03/24/19 0357)  . diltiazem (CARDIZEM) infusion Stopped (03/22/19 1149)   PRN Meds: acetaminophen **OR** acetaminophen, albuterol, dextrose, morphine injection   Vital Signs    Vitals:   03/23/19 1937 03/23/19 2237 03/24/19 0119 03/24/19 0335  BP: 111/72 107/71 111/73 109/73  Pulse: 90 86 84 85  Resp: 20  18 (!) 21  Temp: 98.5 F (36.9 C)  97.9 F (36.6 C) (!) 97.5 F (36.4 C)  TempSrc: Oral  Oral Oral  SpO2: 96%  94% 94%  Weight:    66.7 kg  Height:        Intake/Output Summary (Last 24 hours) at 03/24/2019 0811 Last data filed at 03/24/2019 0357 Gross per 24 hour  Intake 460 ml  Output 375 ml  Net 85 ml   Filed Weights   03/22/19 2000 03/23/19 0428 03/24/19 0335  Weight: 66.8 kg 67 kg 66.7 kg    Telemetry    NSR - Personally Reviewed  ECG    NA - Personally Reviewed  Physical Exam   Constitutional: No acute distress Cardiovascular: regular rhythm, normal rate, no murmurs. S1 and S2 normal. Radial pulses normal bilaterally. No jugular venous distention.  Respiratory: diffuse decreased breath sounds GI : normal bowel sounds, soft and nontender. No distention.   MSK: extremities warm,  well perfused. No edema.  NEURO: grossly nonfocal exam, moves all extremities. PSYCH: alert and oriented x 3, normal mood and affect.   Labs    Chemistry Recent Labs  Lab 03/22/19 0003 03/23/19 0004 03/23/19 0501 03/24/19 0455  NA 129* 131* 132* 132*  K 3.4* 3.4* 2.8* 4.3  CL 94* 102 102 102  CO2 17* 17* 21* 16*  GLUCOSE 413* 234* 164* 174*  BUN 14 16 17 20   CREATININE 1.00 0.72 0.69 0.65  CALCIUM 8.8* 8.1* 7.9* 8.1*  PROT 6.5  --   --   --   ALBUMIN 3.1*  --   --   --   AST 26  --   --   --   ALT 31  --   --   --   ALKPHOS 185*  --   --   --   BILITOT 1.8*  --   --   --   GFRNONAA 60* >60 >60 >60  GFRAA >60 >60 >60 >60  ANIONGAP 18* 12 9 14      Hematology Recent Labs  Lab 03/22/19 0003 03/22/19 0638 03/23/19 0004  WBC 13.8*  --  11.0*  RBC 5.40*  --  4.79  HGB 16.8* 16.0* 14.7  HCT 48.1* 47.0* 43.0  MCV 89.1  --  89.8  MCH 31.1  --  30.7  MCHC 34.9  --  34.2  RDW 12.8  --  12.9  PLT 158  --  130*    Cardiac EnzymesNo results for input(s): TROPONINI in the last 168 hours. No results for input(s): TROPIPOC in the last 168 hours.   BNPNo results for input(s): BNP, PROBNP in the last 168 hours.   DDimer  Recent Labs  Lab 03/22/19 0424  DDIMER 2.66*     Radiology    ECHOCARDIOGRAM COMPLETE  Result Date: 03/23/2019   ECHOCARDIOGRAM REPORT   Patient Name:   ZENIYA LAPIDUS Date of Exam: 03/22/2019 Medical Rec #:  [id number]        Height:       64.0 in Accession #:    [id number]       Weight:       155.0 lb Date of Birth:  24-Mar-1955         BSA:          1.76 m Patient Age:    38 years         BP:           143/86 mmHg Patient Gender: F                HR:           100 bpm. Exam Location:  Inpatient Procedure: 2D Echo and Intracardiac Opacification Agent Indications:    Atrial Flutter 427.32/I48.92  History:        Patient has prior history of Echocardiogram examinations, most                 recent 12/22/2012. Risk Factors:Diabetes and Hypertension.   Sonographer:    Clayton Lefort RDCS (AE) Referring Phys: [id number] RONDELL A SMITH IMPRESSIONS  1. Definity contrast agent was given IV to delineate the left ventricular endocardial borders.  2. Left ventricular ejection fraction, by visual estimation, is 60 to 65%. The left ventricle has normal function. There is no left ventricular hypertrophy.  3. Left ventricular diastolic parameters are consistent with Grade I diastolic dysfunction (impaired relaxation).  4. The left ventricle has no regional wall motion abnormalities.  5. Global right ventricle has normal systolic function.The right ventricular size is normal.  6. Left atrial size was normal.  7. Right atrial size was normal.  8. The mitral valve is normal in structure. Trivial mitral valve regurgitation. No evidence of mitral stenosis.  9. The tricuspid valve is normal in structure. 10. The aortic valve was not well visualized. Aortic valve regurgitation is not visualized. 11. The pulmonic valve was not well visualized. Pulmonic valve regurgitation is not visualized. 12. Aortic dilatation noted. 13. There is mild dilatation of the aortic root measuring 40 mm. 14. The inferior vena cava is dilated in size with >50% respiratory variability, suggesting right atrial pressure of 8 mmHg. 15. Technically difficult; definity used; normal LV function; grade 1 diastolic dysfunction; mildly dilated aortic root. FINDINGS  Left Ventricle: Left ventricular ejection fraction, by visual estimation, is 60 to 65%. The left ventricle has normal function. Definity contrast agent was given IV to delineate the left ventricular endocardial borders. The left ventricle has no regional wall motion abnormalities. There is no left ventricular hypertrophy. Left ventricular diastolic parameters are consistent with Grade I diastolic dysfunction (impaired relaxation). Normal left atrial pressure. Right Ventricle: The right ventricular size is normal. Global RV systolic function is has normal  systolic function. Left Atrium: Left atrial size  was normal in size. Right Atrium: Right atrial size was normal in size Pericardium: There is no evidence of pericardial effusion. Mitral Valve: The mitral valve is normal in structure. Trivial mitral valve regurgitation. No evidence of mitral valve stenosis by observation. Tricuspid Valve: The tricuspid valve is normal in structure. Tricuspid valve regurgitation is trivial. Aortic Valve: The aortic valve was not well visualized. Aortic valve regurgitation is not visualized. Pulmonic Valve: The pulmonic valve was not well visualized. Pulmonic valve regurgitation is not visualized. Pulmonic regurgitation is not visualized. Aorta: Aortic dilatation noted. There is mild dilatation of the aortic root measuring 40 mm. Venous: The inferior vena cava is dilated in size with greater than 50% respiratory variability, suggesting right atrial pressure of 8 mmHg.  Additional Comments: Technically difficult; definity used; normal LV function; grade 1 diastolic dysfunction; mildly dilated aortic root.  LEFT VENTRICLE PLAX 2D LVIDd:         4.17 cm LVIDs:         2.95 cm LV PW:         0.98 cm LV IVS:        1.26 cm LVOT diam:     2.00 cm LV SV:         44 ml LV SV Index:   24.29 LVOT Area:     3.14 cm  RIGHT VENTRICLE             IVC RV S prime:     14.40 cm/s  IVC diam: 2.33 cm TAPSE (M-mode): 2.2 cm LEFT ATRIUM             Index       RIGHT ATRIUM           Index LA diam:        2.50 cm 1.42 cm/m  RA Area:     12.70 cm LA Vol (A2C):   40.6 ml 23.13 ml/m RA Volume:   32.90 ml  18.74 ml/m LA Vol (A4C):   15.0 ml 8.54 ml/m LA Biplane Vol: 27.0 ml 15.38 ml/m  AORTIC VALVE LVOT Vmax:   106.44 cm/s LVOT Vmean:  74.600 cm/s LVOT VTI:    0.196 m  AORTA Ao Root diam: 3.40 cm Ao Asc diam:  4.00 cm  SHUNTS Systemic VTI:  0.20 m Systemic Diam: 2.00 cm  Kirk Ruths MD Electronically signed by Kirk Ruths MD Signature Date/Time: 03/23/2019/7:16:15 AM    Final    US Abdomen Limited  RUQ  Result Date: 03/23/2019 CLINICAL DATA:  Right upper quadrant pain EXAM: ULTRASOUND ABDOMEN LIMITED RIGHT UPPER QUADRANT COMPARISON:  None. FINDINGS: Gallbladder: Cholecystectomy. Common bile duct: Diameter: 7.7 mm. Liver: No focal lesion identified. Mildly increased parenchymal echogenicity. There is intrahepatic biliary dilatation. Portal vein is patent on color Doppler imaging with normal direction of blood flow towards the liver. Other: None. IMPRESSION: Mildly increased liver echogenicity likely reflecting steatosis. Mild intrahepatic and extrahepatic biliary dilatation probably related to post cholecystectomy state. Electronically Signed   By: Macy Mis M.D.   On: 03/23/2019 14:42    Cardiac Studies   ECHO:     1. Definity contrast agent was given IV to delineate the left ventricular endocardial borders.  2. Left ventricular ejection fraction, by visual estimation, is 60 to 65%. The left ventricle has normal function. There is no left ventricular hypertrophy.  3. Left ventricular diastolic parameters are consistent with Grade I diastolic dysfunction (impaired relaxation).  4. The left ventricle has no regional wall motion abnormalities.  5. Global right ventricle has normal systolic function.The right ventricular size is normal.  6. Left atrial size was normal.  7. Right atrial size was normal.  8. The mitral valve is normal in structure. Trivial mitral valve regurgitation. No evidence of mitral stenosis.  9. The tricuspid valve is normal in structure. 10. The aortic valve was not well visualized. Aortic valve regurgitation is not visualized. 11. The pulmonic valve was not well visualized. Pulmonic valve regurgitation is not visualized. 12. Aortic dilatation noted. 13. There is mild dilatation of the aortic root measuring 40 mm. 14. The inferior vena cava is dilated in size with >50% respiratory variability, suggesting right atrial pressure of 8 mmHg. 15. Technically difficult;  definity used; normal LV function; grade 1 diastolic dysfunction; mildly dilated aortic root.  Patient Profile     64 y.o. female with right sided pleuritic chest pain and dyspnea.  Being managed for possible URI and uncontrolled BS.  In the ED she had atrial flutter which broke after IV Cardizem.  She has had this history before treated with flecainide.   Assessment & Plan    ATRIAL FLUTTER:  Maintaining NSR. Continue carvedilol 12.5mg  BID, flecanide 50 mg BID  DM:  A1c is 13.9.  Plan per primary team. Continue simvastatin 10 mg daily  PULMONARY NODULES:   Per primary team.   TOBACCO ABUSE:  Educated.    HYPOKALEMIA:    Normalized today  HYPERTHYROID:   TSH is low.  She is being treated with Tapazole.    Discussed with Dr. Lonny Prude, cardiology will sign off at this time.  Follow up arranged 04/08/19 with Gerrianne Scale in Cardiology.   Signed, Elouise Munroe, MD  03/24/2019, 8:11 AM

## 2019-03-25 ENCOUNTER — Inpatient Hospital Stay (HOSPITAL_COMMUNITY): Payer: 59

## 2019-03-25 ENCOUNTER — Ambulatory Visit: Payer: Self-pay | Admitting: Interventional Cardiology

## 2019-03-25 ENCOUNTER — Encounter (HOSPITAL_COMMUNITY): Payer: Self-pay | Admitting: Internal Medicine

## 2019-03-25 LAB — CULTURE, BLOOD (ROUTINE X 2)
Special Requests: ADEQUATE
Special Requests: ADEQUATE

## 2019-03-25 LAB — GLUCOSE, CAPILLARY
Glucose-Capillary: 127 mg/dL — ABNORMAL HIGH (ref 70–99)
Glucose-Capillary: 107 mg/dL — ABNORMAL HIGH (ref 70–99)
Glucose-Capillary: 144 mg/dL — ABNORMAL HIGH (ref 70–99)

## 2019-03-25 LAB — BASIC METABOLIC PANEL
Anion gap: 11 (ref 5–15)
BUN: 7 mg/dL — ABNORMAL LOW (ref 8–23)
CO2: 26 mmol/L (ref 22–32)
Calcium: 8 mg/dL — ABNORMAL LOW (ref 8.9–10.3)
Chloride: 100 mmol/L (ref 98–111)
Creatinine, Ser: 0.48 mg/dL (ref 0.44–1.00)
GFR calc Af Amer: >60 mL/min (ref >60)
GFR calc non Af Amer: >60 mL/min (ref >60)
Glucose, Bld: 120 mg/dL — ABNORMAL HIGH (ref 70–99)
Potassium: 3 mmol/L — ABNORMAL LOW (ref 3.5–5.1)
Sodium: 137 mmol/L (ref 135–145)

## 2019-03-25 LAB — CBC
HCT: 40.2 % (ref 36.0–46.0)
Hemoglobin: 13.7 g/dL (ref 12.0–15.0)
MCH: 30.7 pg (ref 26.0–34.0)
MCHC: 34.1 g/dL (ref 30.0–36.0)
MCV: 90.1 fL (ref 80.0–100.0)
Platelets: 162 10*3/uL (ref 150–400)
RBC: 4.46 MIL/uL (ref 3.87–5.11)
RDW: 13.3 % (ref 11.5–15.5)
WBC: 10.8 10*3/uL — ABNORMAL HIGH (ref 4.0–10.5)
nRBC: 0 % (ref 0.0–0.2)

## 2019-03-25 LAB — PROCALCITONIN: Procalcitonin: 2.8 ng/mL

## 2019-03-25 IMAGING — CR DG CHEST 2V
2 series · 2 of 2 positions shown · non-contrast
Comparison: [DATE]

CLINICAL DATA: Right-sided chest pain and shortness of breath for 5
days.

EXAM:
CHEST - 2 VIEW

[chest lat]
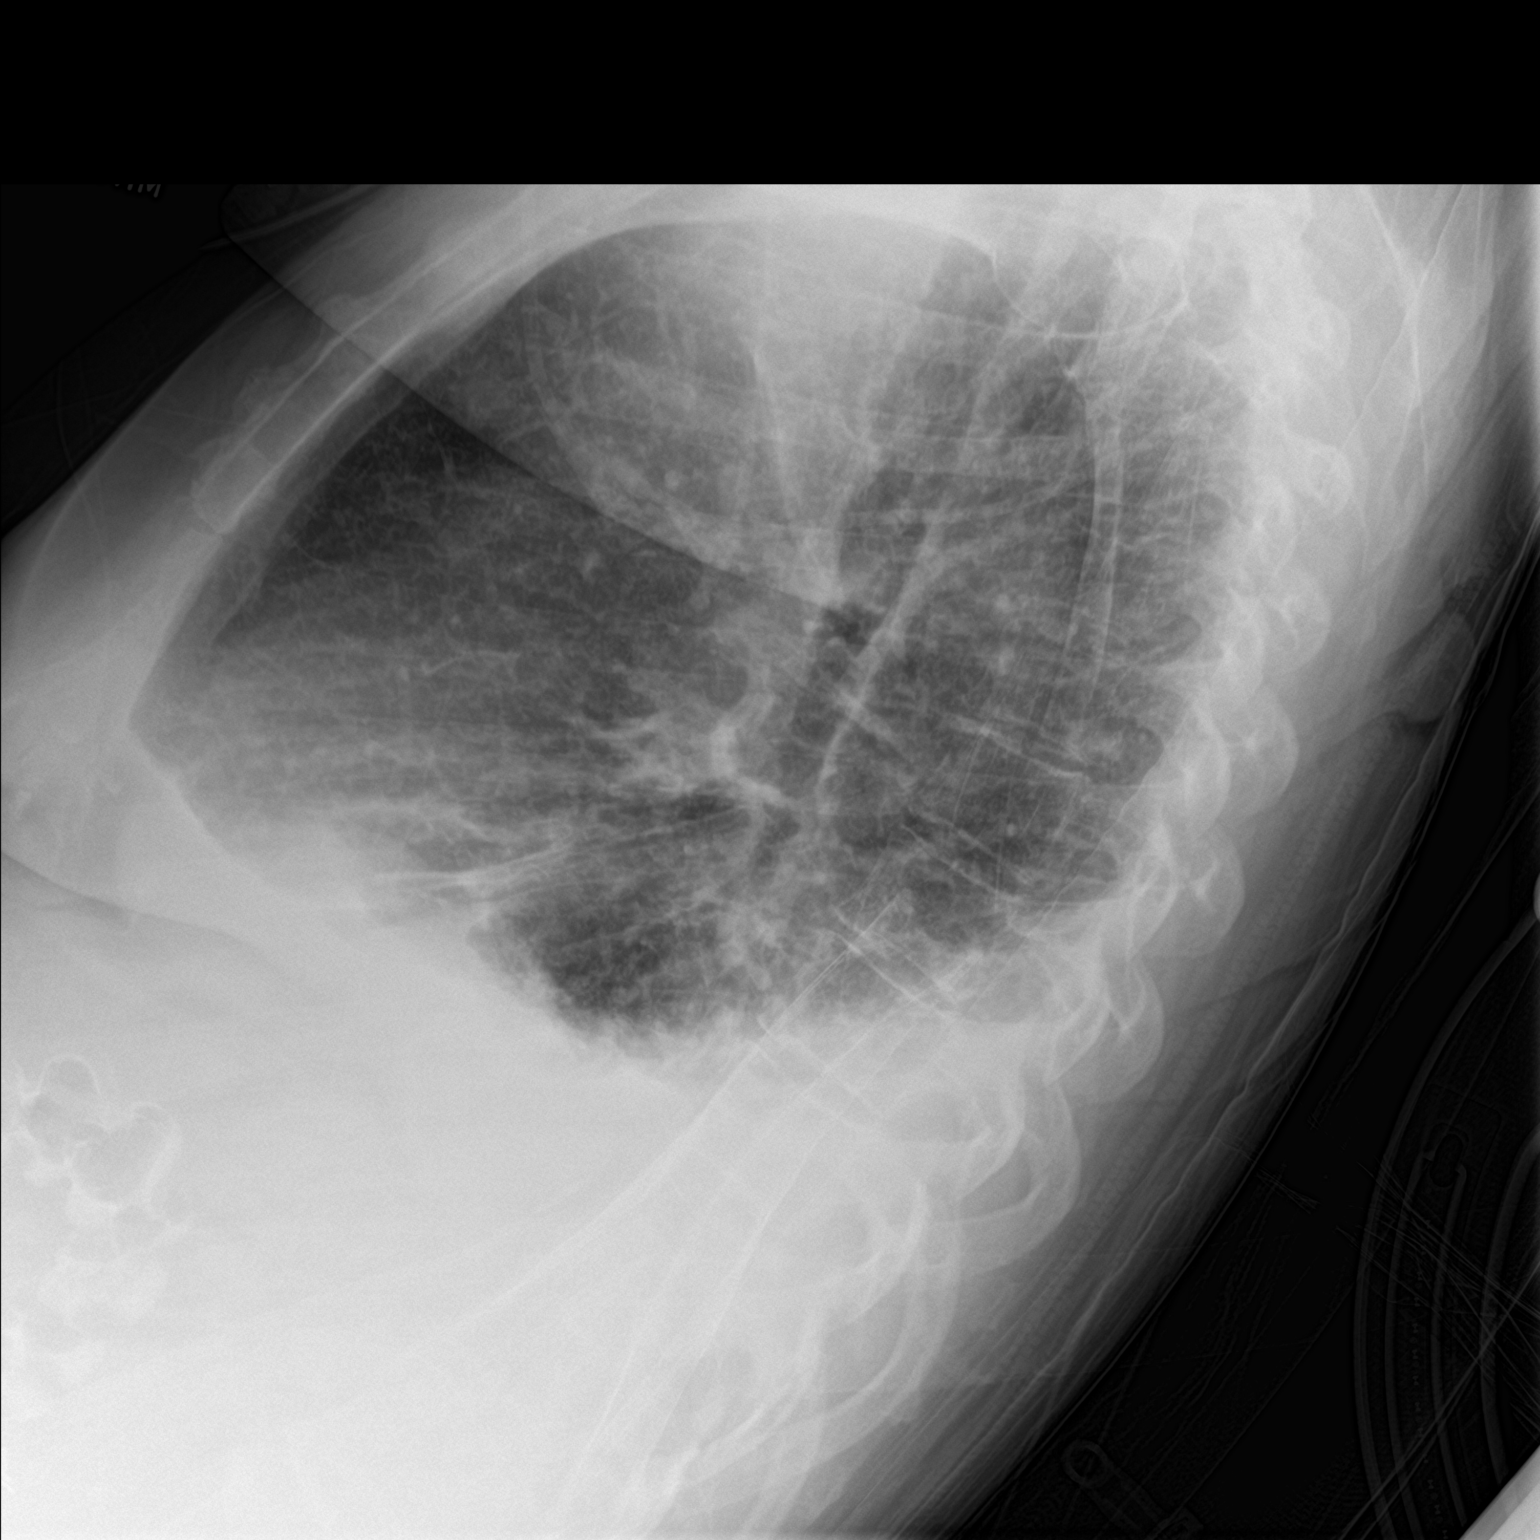

[chest ap]
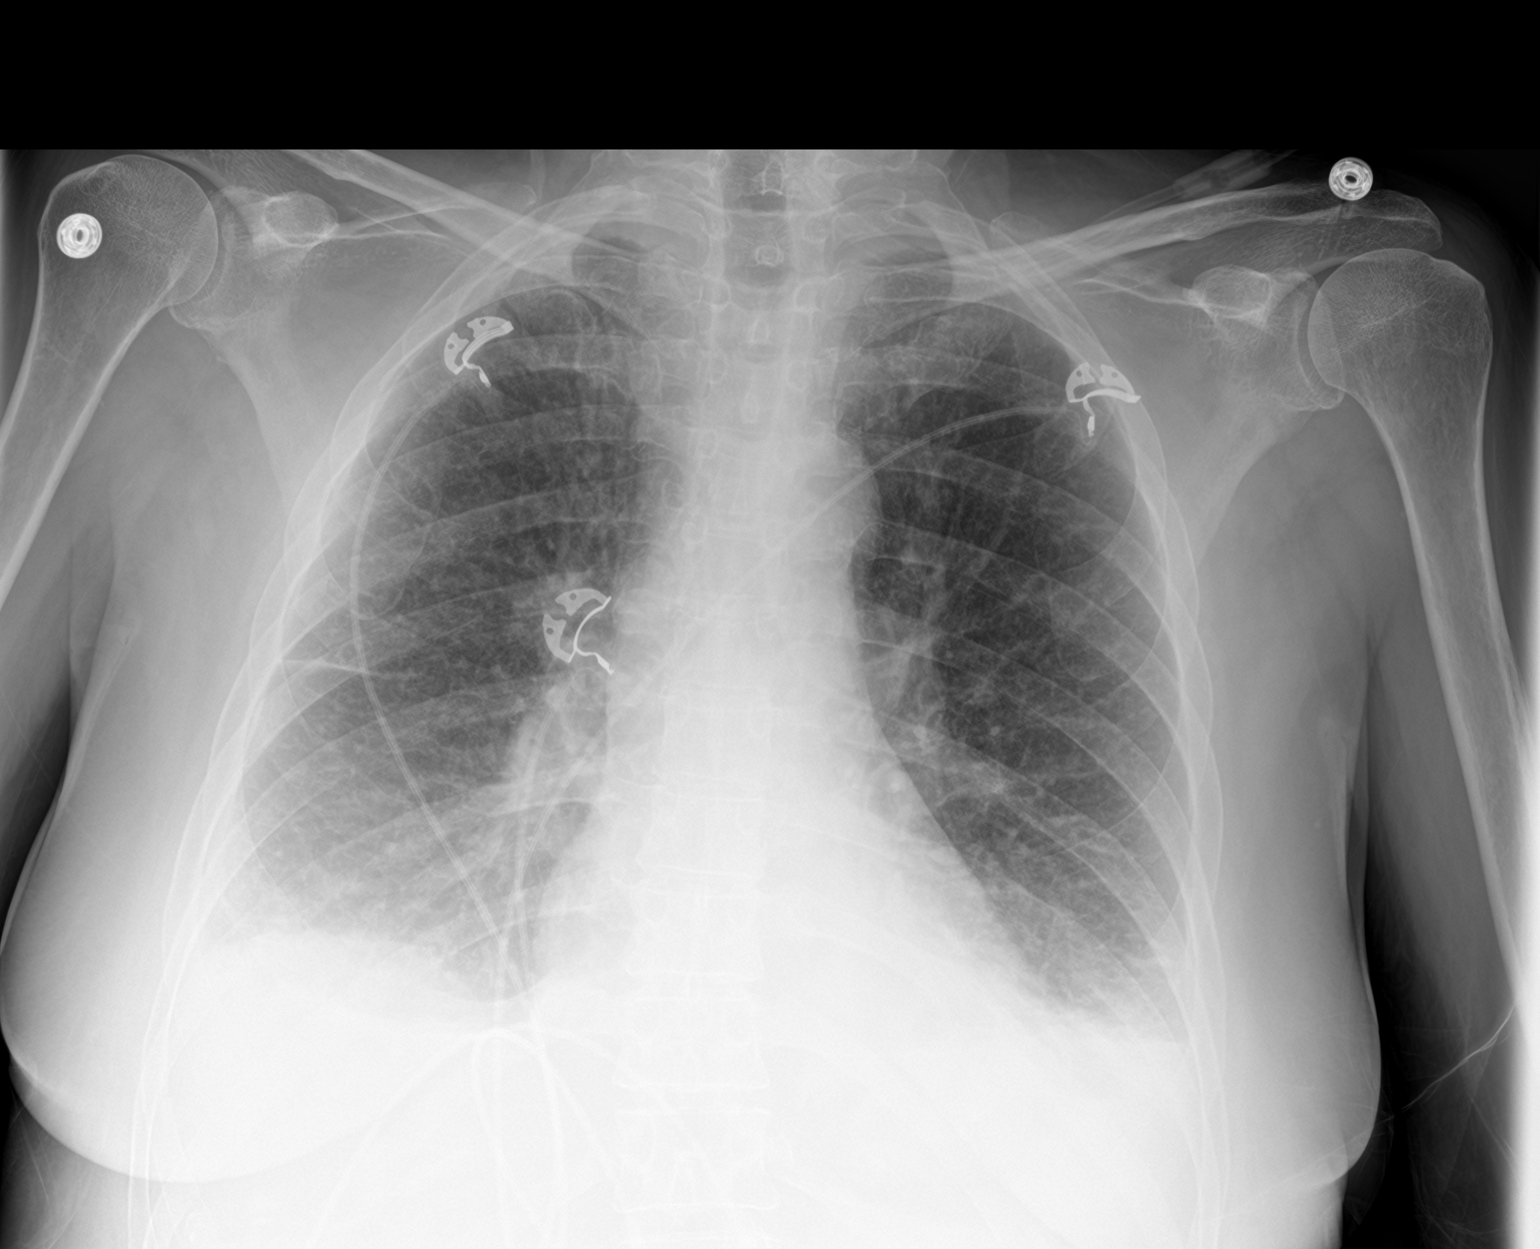

[2 of 2 positions shown; findings below may reference images not displayed]

FINDINGS: The cardiac silhouette, mediastinal and hilar contours are within
normal limits.

Increased interstitial markings and mild peribronchial thickening.
There are small bilateral pleural effusions and overlying bibasilar
atelectasis. Small pulmonary nodule seen on the recent chest CT are
not well demonstrated on the x-ray.
IMPRESSION: 1. Bronchitic type changes suggesting bronchitis along with small
bilateral pleural effusions and bibasilar atelectasis.
2. No focal pulmonary lesions. Small pulmonary nodule seen on CT
scan are not well demonstrated.

## 2019-03-25 NOTE — Progress Notes (Signed)
PROGRESS NOTE    Christy Abbott  ION:[id number] DOB: [birth year] DOA: 03/21/2019 PCP: [name], NP   Brief Narrative: Christy Abbott is a 64 y.o. female with medical history significant of hypertension, DM type II, atrial flutter seen previously by Dr. Lovena Le, hyperthyroidism, and neuroendocrine tumor in 2016 treated at [facility]. Patient presented secondary to right sided chest pain and found to have concern for sepsis with a possible viral illness source. Blood cultures significant for E. Coli.   Assessment & Plan:   Active Problems:   Tobacco use disorder   Hypokalemia   Hyperthyroidism   DKA, type 2 (HCC)   SVT (supraventricular tachycardia) (HCC)   Viral illness   Sepsis ([place])   Pulmonary nodules   Sepsis Present on admission. Secondary to E. Coli bacteremia. Procalcitonin trending down.  E. Coli bacteremia Unknown source. Urinalysis was not consistent with UTI. Associated RUQ in setting of cholecystectomy. Blood cultures significant for E. Coli that is pan-sensitive -Continue Ceftriaxone  RUQ/epigastric pain RUQ ultrasound unremarkable. CT abdomen/pelvis suggest possible infiltrate with evidence of effusion that is new. No fevers. Associated mildly productive cough -Two view x-ray -Continue Ceftriaxone to cover for CAP -Morphine IV prn  Diverticulitis Likely source of infection mentioned above. Sigmoid location. Improving so will not change antibiotic regimen at this time.  DKA Diabetes mellitus, type 2 Likely precipitated by acute infection. Patient managed on insulin drip and is now transitioned to subcutaneous insulin. Patient does not take any insulin as an outpatient. On Amaryl and Metformin as an outpatient. Last hemoglobin A1C was 10.9% in 05/2018 and is currently 13.9%. CO2 trended down and is now normal. -Continue Lantus 20 units and SSI  History of SVT History of Atrial flutter/fibrillation Rate controlled. Normal rhythm -Continue flecainide  and Coreg  Pulmonary nodules Patient is a smoker. No previous knowledge of an abnormal lung exam. Will need outpatient follow-up imaging.  Hyperthyroidism -Continue methimazole  Tobacco use Cessation discussed this admission   DVT prophylaxis: Lovenox Code Status:   Code Status: Full Code Family Communication: Daughter on patient's phone via video chat Disposition Plan: Discharge pending continued clinical improvement, continued treatment with antibiotics. Patient's pain will need to be improved    Consultants:   None  Procedures:   None  Antimicrobials:  Ceftriaxone    Subjective: Feeling better but her abdominal pain, mostly epigastric, is still present.   Objective: Vitals:   03/24/19 1655 03/24/19 2041 03/24/19 2216 03/25/19 0514  BP: 114/60 124/70 118/67 139/83  Pulse: 83 88 86 86  Resp: 19 20  19   Temp: 98.1 F (36.7 C)  98.5 F (36.9 C) 98.1 F (36.7 C)  TempSrc: Oral  Oral Oral  SpO2: 95% 92% 95% 93%  Weight:    69.8 kg  Height:        Intake/Output Summary (Last 24 hours) at 03/25/2019 0955 Last data filed at 03/25/2019 0900 Gross per 24 hour  Intake 420 ml  Output 2150 ml  Net -1730 ml   Filed Weights   03/23/19 0428 03/24/19 0335 03/25/19 0514  Weight: 67 kg 66.7 kg 69.8 kg    Examination:  General exam: Appears calm and comfortable Respiratory system: Rhonchi in right lower/middle lobes, diminished bilaterally. Respiratory effort normal. Cardiovascular system: S1 & S2 heard, RRR. No murmurs, rubs, gallops or clicks. Gastrointestinal system: Abdomen is nondistended, soft and tender in epigastrium. Normal bowel sounds heard. Central nervous system: Alert and oriented. No focal neurological deficits. Extremities: No edema. No calf tenderness Skin: No  cyanosis. No rashes Psychiatry: Judgement and insight appear normal. Mood & affect appropriate.      Data Reviewed: I have personally reviewed following labs and imaging  studies  CBC: Recent Labs  Lab 03/22/19 0003 03/22/19 0638 03/23/19 0004  WBC 13.8*  --  11.0*  NEUTROABS 11.6*  --   --   HGB 16.8* 16.0* 14.7  HCT 48.1* 47.0* 43.0  MCV 89.1  --  89.8  PLT 158  --  353*   Basic Metabolic Panel: Recent Labs  Lab 03/22/19 1008 03/23/19 0004 03/23/19 0501 03/24/19 0455 03/24/19 1259 03/25/19 0605  NA 132* 131* 132* 132* 133* 137  K 3.0* 3.4* 2.8* 4.3 4.2 3.0*  CL 102 102 102 102 102 100  CO2 16* 17* 21* 16* 20* 26  GLUCOSE 211* 234* 164* 174* 200* 120*  BUN 11 16 17 20 19  7*  CREATININE 0.85 0.72 0.69 0.65 0.65 0.48  CALCIUM 7.9* 8.1* 7.9* 8.1* 8.2* 8.0*  MG 1.4* 1.6*  --   --   --   --    GFR: Estimated Creatinine Clearance: 69 mL/min (by C-G formula based on SCr of 0.48 mg/dL). Liver Function Tests: Recent Labs  Lab 03/22/19 0003  AST 26  ALT 31  ALKPHOS 185*  BILITOT 1.8*  PROT 6.5  ALBUMIN 3.1*   Recent Labs  Lab 03/22/19 0424  LIPASE 18   No results for input(s): AMMONIA in the last 168 hours. Coagulation Profile: Recent Labs  Lab 03/22/19 1008  INR 1.0   Cardiac Enzymes: No results for input(s): CKTOTAL, CKMB, CKMBINDEX, TROPONINI in the last 168 hours. BNP (last 3 results) No results for input(s): PROBNP in the last 8760 hours. HbA1C: Recent Labs    03/23/19 0004  HGBA1C 13.9*   CBG: Recent Labs  Lab 03/24/19 0738 03/24/19 1103 03/24/19 1618 03/24/19 2216 03/25/19 0649  GLUCAP 168* 160* 175* 186* 127*   Lipid Profile: No results for input(s): CHOL, HDL, LDLCALC, TRIG, CHOLHDL, LDLDIRECT in the last 72 hours. Thyroid Function Tests: No results for input(s): TSH, T4TOTAL, FREET4, T3FREE, THYROIDAB in the last 72 hours. Anemia Panel: No results for input(s): VITAMINB12, FOLATE, FERRITIN, TIBC, IRON, RETICCTPCT in the last 72 hours. Sepsis Labs: Recent Labs  Lab 03/22/19 1008 03/22/19 2025 03/24/19 2130 03/25/19 0605  PROCALCITON 12.90  --  3.41 2.80  LATICACIDVEN 1.6 1.0  --   --      Recent Results (from the past 240 hour(s))  Respiratory Panel by RT PCR (Flu A&B, Covid) - Nasopharyngeal Swab     Status: None   Collection Time: 03/22/19  4:24 AM   Specimen: Nasopharyngeal Swab  Result Value Ref Range Status   SARS Coronavirus 2 by RT PCR NEGATIVE NEGATIVE Final    Comment: (NOTE) SARS-CoV-2 target nucleic acids are NOT DETECTED. The SARS-CoV-2 RNA is generally detectable in upper respiratoy specimens during the acute phase of infection. The lowest concentration of SARS-CoV-2 viral copies this assay can detect is 131 copies/mL. A negative result does not preclude SARS-Cov-2 infection and should not be used as the sole basis for treatment or other patient management decisions. A negative result may occur with  improper specimen collection/handling, submission of specimen other than nasopharyngeal swab, presence of viral mutation(s) within the areas targeted by this assay, and inadequate number of viral copies (<131 copies/mL). A negative result must be combined with clinical observations, patient history, and epidemiological information. The expected result is Negative. Fact Sheet for Patients:  PinkCheek.be Fact Sheet  for Healthcare Providers:  GravelBags.it This test is not yet ap proved or cleared by the [place] and  has been authorized for detection and/or diagnosis of SARS-CoV-2 by FDA under an Emergency Use Authorization (EUA). This EUA will remain  in effect (meaning this test can be used) for the duration of the COVID-19 declaration under Section 564(b)(1) of the Act, 21 U.S.C. section 360bbb-3(b)(1), unless the authorization is terminated or revoked sooner.    Influenza A by PCR NEGATIVE NEGATIVE Final   Influenza B by PCR NEGATIVE NEGATIVE Final    Comment: (NOTE) The Xpert Xpress SARS-CoV-2/FLU/RSV assay is intended as an aid in  the diagnosis of influenza from Nasopharyngeal  swab specimens and  should not be used as a sole basis for treatment. Nasal washings and  aspirates are unacceptable for Xpert Xpress SARS-CoV-2/FLU/RSV  testing. Fact Sheet for Patients: PinkCheek.be Fact Sheet for Healthcare Providers: GravelBags.it This test is not yet approved or cleared by the [place] FDA and  has been authorized for detection and/or diagnosis of SARS-CoV-2 by  FDA under an Emergency Use Authorization (EUA). This EUA will remain  in effect (meaning this test can be used) for the duration of the  Covid-19 declaration under Section 564(b)(1) of the Act, 21  U.S.C. section 360bbb-3(b)(1), unless the authorization is  terminated or revoked. Performed at [facility], [place] [address], [place], [place] [address]   Respiratory Panel by PCR     Status: None   Collection Time: 03/22/19  8:53 AM   Specimen: Nasopharyngeal Swab; Respiratory  Result Value Ref Range Status   Adenovirus NOT DETECTED NOT DETECTED Final   Coronavirus 229E NOT DETECTED NOT DETECTED Final    Comment: (NOTE) The Coronavirus on the Respiratory Panel, DOES NOT test for the novel  Coronavirus (2019 nCoV)    Coronavirus HKU1 NOT DETECTED NOT DETECTED Final   Coronavirus NL63 NOT DETECTED NOT DETECTED Final   Coronavirus OC43 NOT DETECTED NOT DETECTED Final   Metapneumovirus NOT DETECTED NOT DETECTED Final   Rhinovirus / Enterovirus NOT DETECTED NOT DETECTED Final   Influenza A NOT DETECTED NOT DETECTED Final   Influenza B NOT DETECTED NOT DETECTED Final   Parainfluenza Virus 1 NOT DETECTED NOT DETECTED Final   Parainfluenza Virus 2 NOT DETECTED NOT DETECTED Final   Parainfluenza Virus 3 NOT DETECTED NOT DETECTED Final   Parainfluenza Virus 4 NOT DETECTED NOT DETECTED Final   Respiratory Syncytial Virus NOT DETECTED NOT DETECTED Final   Bordetella pertussis NOT DETECTED NOT DETECTED Final   Chlamydophila pneumoniae NOT DETECTED  NOT DETECTED Final   Mycoplasma pneumoniae NOT DETECTED NOT DETECTED Final    Comment: Performed at [facility], [place]. [address], [place], [place] [address]  Culture, blood (x 2)     Status: Abnormal   Collection Time: 03/22/19 10:44 AM   Specimen: BLOOD LEFT HAND  Result Value Ref Range Status   Specimen Description BLOOD LEFT HAND  Final   Special Requests   Final    BOTTLES DRAWN AEROBIC AND ANAEROBIC Blood Culture adequate volume   Culture  Setup Time   Final    IN BOTH AEROBIC AND ANAEROBIC BOTTLES GRAM NEGATIVE RODS CRITICAL VALUE NOTED.  VALUE IS CONSISTENT WITH PREVIOUSLY REPORTED AND CALLED VALUE.    Culture (A)  Final    ESCHERICHIA COLI SUSCEPTIBILITIES PERFORMED ON PREVIOUS CULTURE WITHIN THE LAST 5 DAYS. Performed at [facility], [place] [address], [place], [place] [address]  Report Status 03/25/2019 FINAL  Final  Culture, blood (x 2)     Status: Abnormal   Collection Time: 03/22/19 10:44 AM   Specimen: BLOOD RIGHT HAND  Result Value Ref Range Status   Specimen Description BLOOD RIGHT HAND  Final   Special Requests   Final    BOTTLES DRAWN AEROBIC ONLY Blood Culture adequate volume   Culture  Setup Time   Final    AEROBIC BOTTLE ONLY GRAM NEGATIVE RODS CRITICAL RESULT CALLED TO, READ BACK BY AND VERIFIED WITH: CRYSTAL ROBERTSON PHARM @ 0205 ON 03/23/19 BY ROBINSON Z.  Performed at [facility], [place] [address] [place], [place] [address]    Culture ESCHERICHIA COLI (A)  Final   Report Status 03/25/2019 FINAL  Final   Organism ID, Bacteria ESCHERICHIA COLI  Final      Susceptibility   Escherichia coli - MIC*    AMPICILLIN <=2 SENSITIVE Sensitive     CEFAZOLIN <=4 SENSITIVE Sensitive     CEFEPIME <=0.12 SENSITIVE Sensitive     CEFTAZIDIME <=1 SENSITIVE Sensitive     CEFTRIAXONE <=0.25 SENSITIVE Sensitive     CIPROFLOXACIN <=0.25 SENSITIVE Sensitive     GENTAMICIN <=1 SENSITIVE Sensitive     IMIPENEM <=0.25 SENSITIVE Sensitive     TRIMETH/SULFA  <=20 SENSITIVE Sensitive     AMPICILLIN/SULBACTAM <=2 SENSITIVE Sensitive     PIP/TAZO <=4 SENSITIVE Sensitive     * ESCHERICHIA COLI  Blood Culture ID Panel (Reflexed)     Status: Abnormal   Collection Time: 03/22/19 10:44 AM  Result Value Ref Range Status   Enterococcus species NOT DETECTED NOT DETECTED Final   Listeria monocytogenes NOT DETECTED NOT DETECTED Final   Staphylococcus species NOT DETECTED NOT DETECTED Final   Staphylococcus aureus (BCID) NOT DETECTED NOT DETECTED Final   Streptococcus species NOT DETECTED NOT DETECTED Final   Streptococcus agalactiae NOT DETECTED NOT DETECTED Final   Streptococcus pneumoniae NOT DETECTED NOT DETECTED Final   Streptococcus pyogenes NOT DETECTED NOT DETECTED Final   Acinetobacter baumannii NOT DETECTED NOT DETECTED Final   Enterobacteriaceae species DETECTED (A) NOT DETECTED Final    Comment: Enterobacteriaceae represent a large family of gram-negative bacteria, not a single organism. CRITICAL RESULT CALLED TO, READ BACK BY AND VERIFIED WITH: CRYSTAL ROBERTSON PHARM @ 0205 ON 03/23/19 BY ROBINSON Z.     Enterobacter cloacae complex NOT DETECTED NOT DETECTED Final   Escherichia coli DETECTED (A) NOT DETECTED Final    Comment: CRITICAL RESULT CALLED TO, READ BACK BY AND VERIFIED WITH: CRYSTAL ROBERTSON PHARM @ 0205 ON 03/23/19 BY ROBINSON Z.     Klebsiella oxytoca NOT DETECTED NOT DETECTED Final   Klebsiella pneumoniae NOT DETECTED NOT DETECTED Final   Proteus species NOT DETECTED NOT DETECTED Final   Serratia marcescens NOT DETECTED NOT DETECTED Final   Carbapenem resistance NOT DETECTED NOT DETECTED Final   Haemophilus influenzae NOT DETECTED NOT DETECTED Final   Neisseria meningitidis NOT DETECTED NOT DETECTED Final   Pseudomonas aeruginosa NOT DETECTED NOT DETECTED Final   Candida albicans NOT DETECTED NOT DETECTED Final   Candida glabrata NOT DETECTED NOT DETECTED Final   Candida krusei NOT DETECTED NOT DETECTED Final   Candida  parapsilosis NOT DETECTED NOT DETECTED Final   Candida tropicalis NOT DETECTED NOT DETECTED Final    Comment: Performed at [facility], [place]. [address], [place], [place] [address]  C difficile quick scan w PCR reflex     Status: None   Collection Time:  03/23/19  5:37 PM   Specimen: STOOL  Result Value Ref Range Status   C Diff antigen NEGATIVE NEGATIVE Final   C Diff toxin NEGATIVE NEGATIVE Final   C Diff interpretation No C. difficile detected.  Final    Comment: Performed at [facility], [place] [address] [place], [place] [address]         Radiology Studies: CT ABDOMEN PELVIS WO CONTRAST  Result Date: 03/24/2019 CLINICAL DATA:  64 year old female with abdominal pain. EXAM: CT ABDOMEN AND PELVIS WITHOUT CONTRAST TECHNIQUE: Multidetector CT imaging of the abdomen and pelvis was performed following the standard protocol without IV contrast. COMPARISON:  Chest CT dated 03/22/2019. FINDINGS: Evaluation of this exam is limited in the absence of intravenous contrast. Lower chest: Partially visualized small bilateral pleural effusions, new since the study of 03/22/2019. Bilateral lower lobe partial consolidative change with air bronchograms may represent atelectasis or pneumonia. Clinical correlation and follow-up recommended. Additional patchy areas of nodular and hazy density noted in the lingula and right middle lobe. There is no intra-abdominal free air or free fluid. Hepatobiliary: The liver is grossly unremarkable. Cholecystectomy. There is mild periportal edema. No retained calcified stone noted in the central CBD. Pancreas: Mildly atrophic pancreas. No active inflammatory changes or dilatation of the main pancreatic duct. Spleen: Normal in size without focal abnormality. Adrenals/Urinary Tract: The adrenal glands are unremarkable. There is no hydronephrosis or nephrolithiasis on either side. The visualized ureters and urinary bladder appear unremarkable. Stomach/Bowel: There is  sigmoid diverticulosis. There is segmental thickening and inflammatory changes of the sigmoid colon which may represent acute diverticulitis versus mild segmental sigmoid colitis. Clinical correlation is recommended. No diverticular abscess or perforation. There is extension of the inflammatory process superiorly along the mesenteric vein with infiltration of the left retroperitoneal fat. If there is clinical concern for ischemia further evaluation with CT angiography is recommended. There is no bowel obstruction. The appendix is normal. Vascular/Lymphatic: Advanced aortoiliac atherosclerotic disease. The IVC is unremarkable. No portal venous gas. Several small mildly rounded retroperitoneal lymph nodes, likely reactive. No adenopathy. Reproductive: The uterus and ovaries are grossly unremarkable. Other: Mild subcutaneous edema. Musculoskeletal: No acute or significant osseous findings. IMPRESSION: 1. Sigmoid diverticulitis versus possible colitis. Clinical correlation is recommended. No drainable fluid collection abscess. No pneumatosis or portal venous gas. If there is clinical concern for an ischemic process, further evaluation with CT angiography recommended. 2. No bowel obstruction.  Normal appendix. 3. Small bilateral pleural effusions and bilateral atelectasis or infiltrate, new since the prior CT. Clinical correlation and follow-up recommended. 4.  Aortic Atherosclerosis (ICD10-I70.0). Electronically Signed   By: Anner Crete M.D.   On: 03/24/2019 20:26   US Abdomen Limited RUQ  Result Date: 03/23/2019 CLINICAL DATA:  Right upper quadrant pain EXAM: ULTRASOUND ABDOMEN LIMITED RIGHT UPPER QUADRANT COMPARISON:  None. FINDINGS: Gallbladder: Cholecystectomy. Common bile duct: Diameter: 7.7 mm. Liver: No focal lesion identified. Mildly increased parenchymal echogenicity. There is intrahepatic biliary dilatation. Portal vein is patent on color Doppler imaging with normal direction of blood flow towards the  liver. Other: None. IMPRESSION: Mildly increased liver echogenicity likely reflecting steatosis. Mild intrahepatic and extrahepatic biliary dilatation probably related to post cholecystectomy state. Electronically Signed   By: Macy Mis M.D.   On: 03/23/2019 14:42        Scheduled Meds: . carvedilol  12.5 mg Oral BID WC  . enoxaparin (LOVENOX) injection  40 mg Subcutaneous Q24H  . flecainide  50 mg Oral BID  . insulin  aspart  0-15 Units Subcutaneous TID WC  . insulin aspart  0-5 Units Subcutaneous QHS  . insulin glargine  20 Units Subcutaneous Daily  . methimazole  15 mg Oral Daily  . potassium chloride SA  20 mEq Oral Daily  . simvastatin  10 mg Oral QHS  . sodium chloride flush  3 mL Intravenous Q12H   Continuous Infusions: . sodium chloride 100 mL/hr at 03/24/19 2049  . cefTRIAXone (ROCEPHIN)  IV 2 g (03/25/19 0323)  . diltiazem (CARDIZEM) infusion Stopped (03/22/19 1149)     LOS: 3 days     Cordelia Poche, MD [facility] 03/25/2019, 9:55 AM  If 7PM-7AM, please contact night-coverage [email or web address]

## 2019-03-26 DIAGNOSIS — E1310 Other specified diabetes mellitus with ketoacidosis without coma: Secondary | ICD-10-CM

## 2019-03-26 LAB — BASIC METABOLIC PANEL
Anion gap: 9 (ref 5–15)
BUN: 5 mg/dL — ABNORMAL LOW (ref 8–23)
CO2: 29 mmol/L (ref 22–32)
Calcium: 8.2 mg/dL — ABNORMAL LOW (ref 8.9–10.3)
Chloride: 104 mmol/L (ref 98–111)
Creatinine, Ser: 0.53 mg/dL (ref 0.44–1.00)
GFR calc Af Amer: >60 mL/min (ref >60)
GFR calc non Af Amer: >60 mL/min (ref >60)
Glucose, Bld: 99 mg/dL (ref 70–99)
Potassium: 3.6 mmol/L (ref 3.5–5.1)
Sodium: 142 mmol/L (ref 135–145)

## 2019-03-26 LAB — GLUCOSE, CAPILLARY
Glucose-Capillary: 103 mg/dL — ABNORMAL HIGH (ref 70–99)
Glucose-Capillary: 132 mg/dL — ABNORMAL HIGH (ref 70–99)
Glucose-Capillary: 115 mg/dL — ABNORMAL HIGH (ref 70–99)
Glucose-Capillary: 139 mg/dL — ABNORMAL HIGH (ref 70–99)

## 2019-03-26 LAB — PROCALCITONIN: Procalcitonin: 1.67 ng/mL

## 2019-03-26 MED ORDER — OXYCODONE HCL 5 MG PO TABS
5.0000 mg | ORAL_TABLET | ORAL | Status: DC | PRN
  Administered 2019-03-27 – 2019-03-28 (×3): 5 mg via ORAL
  Filled 2019-03-26 (×3): qty 1

## 2019-03-26 MED ORDER — FUROSEMIDE 10 MG/ML IJ SOLN
40.0000 mg | Freq: Once | INTRAMUSCULAR | Status: AC
  Administered 2019-03-26: 40 mg via INTRAVENOUS
  Filled 2019-03-26: qty 4

## 2019-03-26 MED ORDER — LIDOCAINE 5 % EX PTCH
1.0000 | MEDICATED_PATCH | CUTANEOUS | Status: DC
  Administered 2019-03-26 – 2019-03-28 (×3): 1 via TRANSDERMAL
  Filled 2019-03-26 (×3): qty 1

## 2019-03-26 NOTE — Progress Notes (Signed)
Patient lost IV access during ceftriaxone IVPB. Restarted using remaining IV manually programming the pump.

## 2019-03-26 NOTE — Progress Notes (Signed)
PROGRESS NOTE  Christy Abbott VVO:[id number] DOB: 02-16-1956 DOA: 03/21/2019 PCP: [name], NP   LOS: 4 days   Brief Narrative / Interim history: 64 year old female with HTN, DM 2, a flutter previously seen by Dr. Lovena Le, hypothyroidism, neuroendocrine tumor in 2016 treated at [facility], came into the hospital with right-sided chest pain.  On imaging she was found to have potentially small bilateral pleural effusions and bilateral atelectasis/infiltrate.  CT scan of the abdomen and pelvis also showed sigmoid diverticulitis.  Essentially her blood culture speciated E. coli for which she is on ceftriaxone  Subjective / 24h Interval events: Continues to complain of severe chest pain worsened with deep breath  Assessment & Plan: Principal Problem Sepsis due to E. coli bacteremia -Likely source diverticulitis, located in the sigmoid area -Sepsis physiology resolved, her abdomen is nontender, and she is tolerating a regular diet -Currently on ceftriaxone, continue, will transition to orals on discharge  Active Problems Acute chest pain -Main reason why she came into the hospital and still persistent today.  She is quite uncomfortable.  Pain is located in the epigastric area and is pleuritic in nature worsening with deep breaths.  Hypoxic respiratory failure -today desatting to 80s on room air requiring 2l [place].  -possibly mildly fluid overloaded, stop IVF and give Lasix -wean off O2 as tolerated   Sigmoid diverticulitis -Continue antibiotics as above, clinically improving and tolerating a diet  DKA/DM 2 -Likely precipitated by acute infection, initially required insulin drip and now transition to subcutaneous insulin.  Her hemoglobin A1c was elevated to 13.9.  She was placed on Lantus and sliding scale with improvement in her sugars.  Will likely need to be on insulin at home given elevation in A1c, will need teaching and check with case management regarding price  CBG (last 3)    Recent Labs    03/25/19 1701 03/26/19 0636 03/26/19 1115  GLUCAP 144* 103* 132*    Pulmonary nodules -will need outpatient follow-up and repeat imaging by PCP  Hyper thyroidism -Continue methimazole  Tobacco use -She will need to quit  History of SVT/A. Fib/flutter -Cardiology evaluated, continue flecainide, Coreg   Scheduled Meds: . carvedilol  12.5 mg Oral BID WC  . enoxaparin (LOVENOX) injection  40 mg Subcutaneous Q24H  . flecainide  50 mg Oral BID  . insulin aspart  0-15 Units Subcutaneous TID WC  . insulin aspart  0-5 Units Subcutaneous QHS  . insulin glargine  20 Units Subcutaneous Daily  . lidocaine  1 patch Transdermal Q24H  . methimazole  15 mg Oral Daily  . potassium chloride SA  20 mEq Oral Daily  . simvastatin  10 mg Oral QHS  . sodium chloride flush  3 mL Intravenous Q12H   Continuous Infusions: . sodium chloride 100 mL/hr at 03/26/19 0706  . cefTRIAXone (ROCEPHIN)  IV 2 g (03/26/19 0232)  . diltiazem (CARDIZEM) infusion Stopped (03/22/19 1149)   PRN Meds:.acetaminophen **OR** acetaminophen, albuterol, dextrose, morphine injection, oxyCODONE  DVT prophylaxis: Lovenox Code Status: Full code Family Communication: no family at bedside, called and updated daughter  Disposition Plan: home when ready   Consultants:  Cardiology   Procedures:  2D echo:  IMPRESSIONS  1. Definity contrast agent was given IV to delineate the left ventricular endocardial borders.  2. Left ventricular ejection fraction, by visual estimation, is 60 to 65%. The left ventricle has normal function. There is no left ventricular hypertrophy.  3. Left ventricular diastolic parameters are consistent with Grade I diastolic dysfunction (impaired  relaxation).  4. The left ventricle has no regional wall motion abnormalities.  5. Global right ventricle has normal systolic function.The right ventricular size is normal.  6. Left atrial size was normal.  7. Right atrial size was normal.   8. The mitral valve is normal in structure. Trivial mitral valve regurgitation. No evidence of mitral stenosis.  9. The tricuspid valve is normal in structure. 10. The aortic valve was not well visualized. Aortic valve regurgitation is not visualized. 11. The pulmonic valve was not well visualized. Pulmonic valve regurgitation is not visualized. 12. Aortic dilatation noted. 13. There is mild dilatation of the aortic root measuring 40 mm. 14. The inferior vena cava is dilated in size with >50% respiratory variability, suggesting right atrial pressure of 8 mmHg. 15. Technically difficult; definity used; normal LV function; grade 1 diastolic dysfunction; mildly dilated aortic root.  Microbiology  Blood cultures 1/9-pansensitive E. coli  Antimicrobials: Ceftriaxone 1/8 >>   Objective: Vitals:   03/26/19 0438 03/26/19 0442 03/26/19 0736 03/26/19 1114  BP: 123/72  (!) 152/79 (!) 144/79  Pulse: 75  85 87  Resp: 18  (!) 22 17  Temp: 98.7 F (37.1 C)   98.6 F (37 C)  TempSrc: Oral   Oral  SpO2: 94%   90%  Weight:  70.2 kg    Height:        Intake/Output Summary (Last 24 hours) at 03/26/2019 1322 Last data filed at 03/26/2019 1300 Gross per 24 hour  Intake 1843 ml  Output 1600 ml  Net 243 ml   Filed Weights   03/24/19 0335 03/25/19 0514 03/26/19 0442  Weight: 66.7 kg 69.8 kg 70.2 kg    Examination:  Constitutional: In pain Eyes: no scleral icterus ENMT: Mucous membranes are moist.  Neck: normal, supple Respiratory: clear to auscultation bilaterally, no wheezing, no crackles. Normal respiratory effort.  Cardiovascular: Regular rate and rhythm, no murmurs / rubs / gallops. No LE edema. Good peripheral pulses Abdomen: non distended, no tenderness. Bowel sounds positive.  Musculoskeletal: no clubbing / cyanosis.  Skin: no rashes Neurologic: non focal, equal strength  Data Reviewed: I have independently reviewed following labs and imaging studies   CBC: Recent Labs  Lab  13-Apr-2019 0003 04/13/19 0638 03/23/19 0004 03/25/19 0947  WBC 13.8*  --  11.0* 10.8*  NEUTROABS 11.6*  --   --   --   HGB 16.8* 16.0* 14.7 13.7  HCT 48.1* 47.0* 43.0 40.2  MCV 89.1  --  89.8 90.1  PLT 158  --  130* 235   Basic Metabolic Panel: Recent Labs  Lab 04/13/2019 1008 03/23/19 0004 03/23/19 0501 03/24/19 0455 03/24/19 1259 03/25/19 0605 03/26/19 0430  NA 132* 131* 132* 132* 133* 137 142  K 3.0* 3.4* 2.8* 4.3 4.2 3.0* 3.6  CL 102 102 102 102 102 100 104  CO2 16* 17* 21* 16* 20* 26 29  GLUCOSE 211* 234* 164* 174* 200* 120* 99  BUN 11 16 17 20 19  7* 5*  CREATININE 0.85 0.72 0.69 0.65 0.65 0.48 0.53  CALCIUM 7.9* 8.1* 7.9* 8.1* 8.2* 8.0* 8.2*  MG 1.4* 1.6*  --   --   --   --   --    Liver Function Tests: Recent Labs  Lab 04-13-2019 0003  AST 26  ALT 31  ALKPHOS 185*  BILITOT 1.8*  PROT 6.5  ALBUMIN 3.1*   Coagulation Profile: Recent Labs  Lab 2019-04-13 1008  INR 1.0   HbA1C: No results for input(s): HGBA1C  in the last 72 hours. CBG: Recent Labs  Lab 03/25/19 0649 03/25/19 1108 03/25/19 1701 03/26/19 0636 03/26/19 1115  GLUCAP 127* 107* 144* 103* 132*    Recent Results (from the past 240 hour(s))  Respiratory Panel by RT PCR (Flu A&B, Covid) - Nasopharyngeal Swab     Status: None   Collection Time: 03/22/19  4:24 AM   Specimen: Nasopharyngeal Swab  Result Value Ref Range Status   SARS Coronavirus 2 by RT PCR NEGATIVE NEGATIVE Final    Comment: (NOTE) SARS-CoV-2 target nucleic acids are NOT DETECTED. The SARS-CoV-2 RNA is generally detectable in upper respiratoy specimens during the acute phase of infection. The lowest concentration of SARS-CoV-2 viral copies this assay can detect is 131 copies/mL. A negative result does not preclude SARS-Cov-2 infection and should not be used as the sole basis for treatment or other patient management decisions. A negative result may occur with  improper specimen collection/handling, submission of specimen  other than nasopharyngeal swab, presence of viral mutation(s) within the areas targeted by this assay, and inadequate number of viral copies (<131 copies/mL). A negative result must be combined with clinical observations, patient history, and epidemiological information. The expected result is Negative. Fact Sheet for Patients:  PinkCheek.be Fact Sheet for Healthcare Providers:  GravelBags.it This test is not yet ap proved or cleared by the [place] FDA and  has been authorized for detection and/or diagnosis of SARS-CoV-2 by FDA under an Emergency Use Authorization (EUA). This EUA will remain  in effect (meaning this test can be used) for the duration of the COVID-19 declaration under Section 564(b)(1) of the Act, 21 U.S.C. section 360bbb-3(b)(1), unless the authorization is terminated or revoked sooner.    Influenza A by PCR NEGATIVE NEGATIVE Final   Influenza B by PCR NEGATIVE NEGATIVE Final    Comment: (NOTE) The Xpert Xpress SARS-CoV-2/FLU/RSV assay is intended as an aid in  the diagnosis of influenza from Nasopharyngeal swab specimens and  should not be used as a sole basis for treatment. Nasal washings and  aspirates are unacceptable for Xpert Xpress SARS-CoV-2/FLU/RSV  testing. Fact Sheet for Patients: PinkCheek.be Fact Sheet for Healthcare Providers: GravelBags.it This test is not yet approved or cleared by the [place] FDA and  has been authorized for detection and/or diagnosis of SARS-CoV-2 by  FDA under an Emergency Use Authorization (EUA). This EUA will remain  in effect (meaning this test can be used) for the duration of the  Covid-19 declaration under Section 564(b)(1) of the Act, 21  U.S.C. section 360bbb-3(b)(1), unless the authorization is  terminated or revoked. Performed at [facility], [place] [address] [place], [place] [address]     Respiratory Panel by PCR     Status: None   Collection Time: 03/22/19  8:53 AM   Specimen: Nasopharyngeal Swab; Respiratory  Result Value Ref Range Status   Adenovirus NOT DETECTED NOT DETECTED Final   Coronavirus 229E NOT DETECTED NOT DETECTED Final    Comment: (NOTE) The Coronavirus on the Respiratory Panel, DOES NOT test for the novel  Coronavirus (2019 nCoV)    Coronavirus HKU1 NOT DETECTED NOT DETECTED Final   Coronavirus NL63 NOT DETECTED NOT DETECTED Final   Coronavirus OC43 NOT DETECTED NOT DETECTED Final   Metapneumovirus NOT DETECTED NOT DETECTED Final   Rhinovirus / Enterovirus NOT DETECTED NOT DETECTED Final   Influenza A NOT DETECTED NOT DETECTED Final   Influenza B NOT DETECTED NOT DETECTED Final   Parainfluenza Virus 1 NOT DETECTED  NOT DETECTED Final   Parainfluenza Virus 2 NOT DETECTED NOT DETECTED Final   Parainfluenza Virus 3 NOT DETECTED NOT DETECTED Final   Parainfluenza Virus 4 NOT DETECTED NOT DETECTED Final   Respiratory Syncytial Virus NOT DETECTED NOT DETECTED Final   Bordetella pertussis NOT DETECTED NOT DETECTED Final   Chlamydophila pneumoniae NOT DETECTED NOT DETECTED Final   Mycoplasma pneumoniae NOT DETECTED NOT DETECTED Final    Comment: Performed at [facility], [place] [address], [place], [place] [address]  Culture, blood (x 2)     Status: Abnormal   Collection Time: 03/22/19 10:44 AM   Specimen: BLOOD LEFT HAND  Result Value Ref Range Status   Specimen Description BLOOD LEFT HAND  Final   Special Requests   Final    BOTTLES DRAWN AEROBIC AND ANAEROBIC Blood Culture adequate volume   Culture  Setup Time   Final    IN BOTH AEROBIC AND ANAEROBIC BOTTLES GRAM NEGATIVE RODS CRITICAL VALUE NOTED.  VALUE IS CONSISTENT WITH PREVIOUSLY REPORTED AND CALLED VALUE.    Culture (A)  Final    ESCHERICHIA COLI SUSCEPTIBILITIES PERFORMED ON PREVIOUS CULTURE WITHIN THE LAST 5 DAYS. Performed at [facility], [place] [address] [place], [place]  [address]    Report Status 03/25/2019 FINAL  Final  Culture, blood (x 2)     Status: Abnormal   Collection Time: 03/22/19 10:44 AM   Specimen: BLOOD RIGHT HAND  Result Value Ref Range Status   Specimen Description BLOOD RIGHT HAND  Final   Special Requests   Final    BOTTLES DRAWN AEROBIC ONLY Blood Culture adequate volume   Culture  Setup Time   Final    AEROBIC BOTTLE ONLY GRAM NEGATIVE RODS CRITICAL RESULT CALLED TO, READ BACK BY AND VERIFIED WITH: CRYSTAL ROBERTSON PHARM @ 0205 ON 03/23/19 BY ROBINSON Z.  Performed at [facility], [place] [address], [place], [place] [address]    Culture ESCHERICHIA COLI (A)  Final   Report Status 03/25/2019 FINAL  Final   Organism ID, Bacteria ESCHERICHIA COLI  Final      Susceptibility   Escherichia coli - MIC*    AMPICILLIN <=2 SENSITIVE Sensitive     CEFAZOLIN <=4 SENSITIVE Sensitive     CEFEPIME <=0.12 SENSITIVE Sensitive     CEFTAZIDIME <=1 SENSITIVE Sensitive     CEFTRIAXONE <=0.25 SENSITIVE Sensitive     CIPROFLOXACIN <=0.25 SENSITIVE Sensitive     GENTAMICIN <=1 SENSITIVE Sensitive     IMIPENEM <=0.25 SENSITIVE Sensitive     TRIMETH/SULFA <=20 SENSITIVE Sensitive     AMPICILLIN/SULBACTAM <=2 SENSITIVE Sensitive     PIP/TAZO <=4 SENSITIVE Sensitive     * ESCHERICHIA COLI  Blood Culture ID Panel (Reflexed)     Status: Abnormal   Collection Time: 03/22/19 10:44 AM  Result Value Ref Range Status   Enterococcus species NOT DETECTED NOT DETECTED Final   Listeria monocytogenes NOT DETECTED NOT DETECTED Final   Staphylococcus species NOT DETECTED NOT DETECTED Final   Staphylococcus aureus (BCID) NOT DETECTED NOT DETECTED Final   Streptococcus species NOT DETECTED NOT DETECTED Final   Streptococcus agalactiae NOT DETECTED NOT DETECTED Final   Streptococcus pneumoniae NOT DETECTED NOT DETECTED Final   Streptococcus pyogenes NOT DETECTED NOT DETECTED Final   Acinetobacter baumannii NOT DETECTED NOT DETECTED Final   Enterobacteriaceae  species DETECTED (A) NOT DETECTED Final    Comment: Enterobacteriaceae represent a large family of gram-negative bacteria, not a single organism. CRITICAL RESULT  CALLED TO, READ BACK BY AND VERIFIED WITH: CRYSTAL ROBERTSON PHARM @ 0205 ON 03/23/19 BY ROBINSON Z.     Enterobacter cloacae complex NOT DETECTED NOT DETECTED Final   Escherichia coli DETECTED (A) NOT DETECTED Final    Comment: CRITICAL RESULT CALLED TO, READ BACK BY AND VERIFIED WITH: CRYSTAL ROBERTSON PHARM @ 0205 ON 03/23/19 BY ROBINSON Z.     Klebsiella oxytoca NOT DETECTED NOT DETECTED Final   Klebsiella pneumoniae NOT DETECTED NOT DETECTED Final   Proteus species NOT DETECTED NOT DETECTED Final   Serratia marcescens NOT DETECTED NOT DETECTED Final   Carbapenem resistance NOT DETECTED NOT DETECTED Final   Haemophilus influenzae NOT DETECTED NOT DETECTED Final   Neisseria meningitidis NOT DETECTED NOT DETECTED Final   Pseudomonas aeruginosa NOT DETECTED NOT DETECTED Final   Candida albicans NOT DETECTED NOT DETECTED Final   Candida glabrata NOT DETECTED NOT DETECTED Final   Candida krusei NOT DETECTED NOT DETECTED Final   Candida parapsilosis NOT DETECTED NOT DETECTED Final   Candida tropicalis NOT DETECTED NOT DETECTED Final    Comment: Performed at [facility], [place] [address], [place], [place] [address]  C difficile quick scan w PCR reflex     Status: None   Collection Time: 03/23/19  5:37 PM   Specimen: STOOL  Result Value Ref Range Status   C Diff antigen NEGATIVE NEGATIVE Final   C Diff toxin NEGATIVE NEGATIVE Final   C Diff interpretation No C. difficile detected.  Final    Comment: Performed at [facility], [place] [address], [place],  [address]     Radiology Studies: No results found.  Marzetta Board, MD, PhD [facility]  Between 7 am - 7 pm I am available, please contact me via Amion or Securechat  Between 7 pm - 7 am I am not available, please contact night coverage MD/APP via  Amion

## 2019-03-26 NOTE — Progress Notes (Signed)
Diabetes education given with teach back to pt. Pt needs reinforcement.  Diabetes: Insulin injection video education played for pt through pt education network in pt's room. Will require reinforcement.

## 2019-03-26 NOTE — Care Management (Signed)
Per [facility] Co-pay amount for Vials and Pens,Levemir,Novolog,Lantus  30 day supply $200.00,  90 day supply $500.00. (retail)  No PA required Deductible Not Met Tier 3 medication [facility]: [facility] (Retail). Mail order 30 day supply  $200.00/ 90 day supply $500.00 (Elixir)  [facility]ail [facility]  Ref.# [id number]. 

## 2019-03-27 ENCOUNTER — Inpatient Hospital Stay (HOSPITAL_COMMUNITY): Payer: 59

## 2019-03-27 LAB — BASIC METABOLIC PANEL
Anion gap: 13 (ref 5–15)
BUN: <5 mg/dL — ABNORMAL LOW (ref 8–23)
CO2: 34 mmol/L — ABNORMAL HIGH (ref 22–32)
Calcium: 8.4 mg/dL — ABNORMAL LOW (ref 8.9–10.3)
Chloride: 96 mmol/L — ABNORMAL LOW (ref 98–111)
Creatinine, Ser: 0.52 mg/dL (ref 0.44–1.00)
GFR calc Af Amer: >60 mL/min (ref >60)
GFR calc non Af Amer: >60 mL/min (ref >60)
Glucose, Bld: 106 mg/dL — ABNORMAL HIGH (ref 70–99)
Potassium: 2.6 mmol/L — CL (ref 3.5–5.1)
Sodium: 143 mmol/L (ref 135–145)

## 2019-03-27 LAB — GLUCOSE, CAPILLARY
Glucose-Capillary: 100 mg/dL — ABNORMAL HIGH (ref 70–99)
Glucose-Capillary: 117 mg/dL — ABNORMAL HIGH (ref 70–99)
Glucose-Capillary: 180 mg/dL — ABNORMAL HIGH (ref 70–99)
Glucose-Capillary: 112 mg/dL — ABNORMAL HIGH (ref 70–99)

## 2019-03-27 LAB — MAGNESIUM: Magnesium: 1.5 mg/dL — ABNORMAL LOW (ref 1.7–2.4)

## 2019-03-27 LAB — POTASSIUM: Potassium: 3.8 mmol/L (ref 3.5–5.1)

## 2019-03-27 IMAGING — CR DG CHEST 2V
2 series · 2 of 2 positions shown · non-contrast
Comparison: [DATE]

CLINICAL DATA: Oxygen release

EXAM:
CHEST - 2 VIEW

[chest pa]
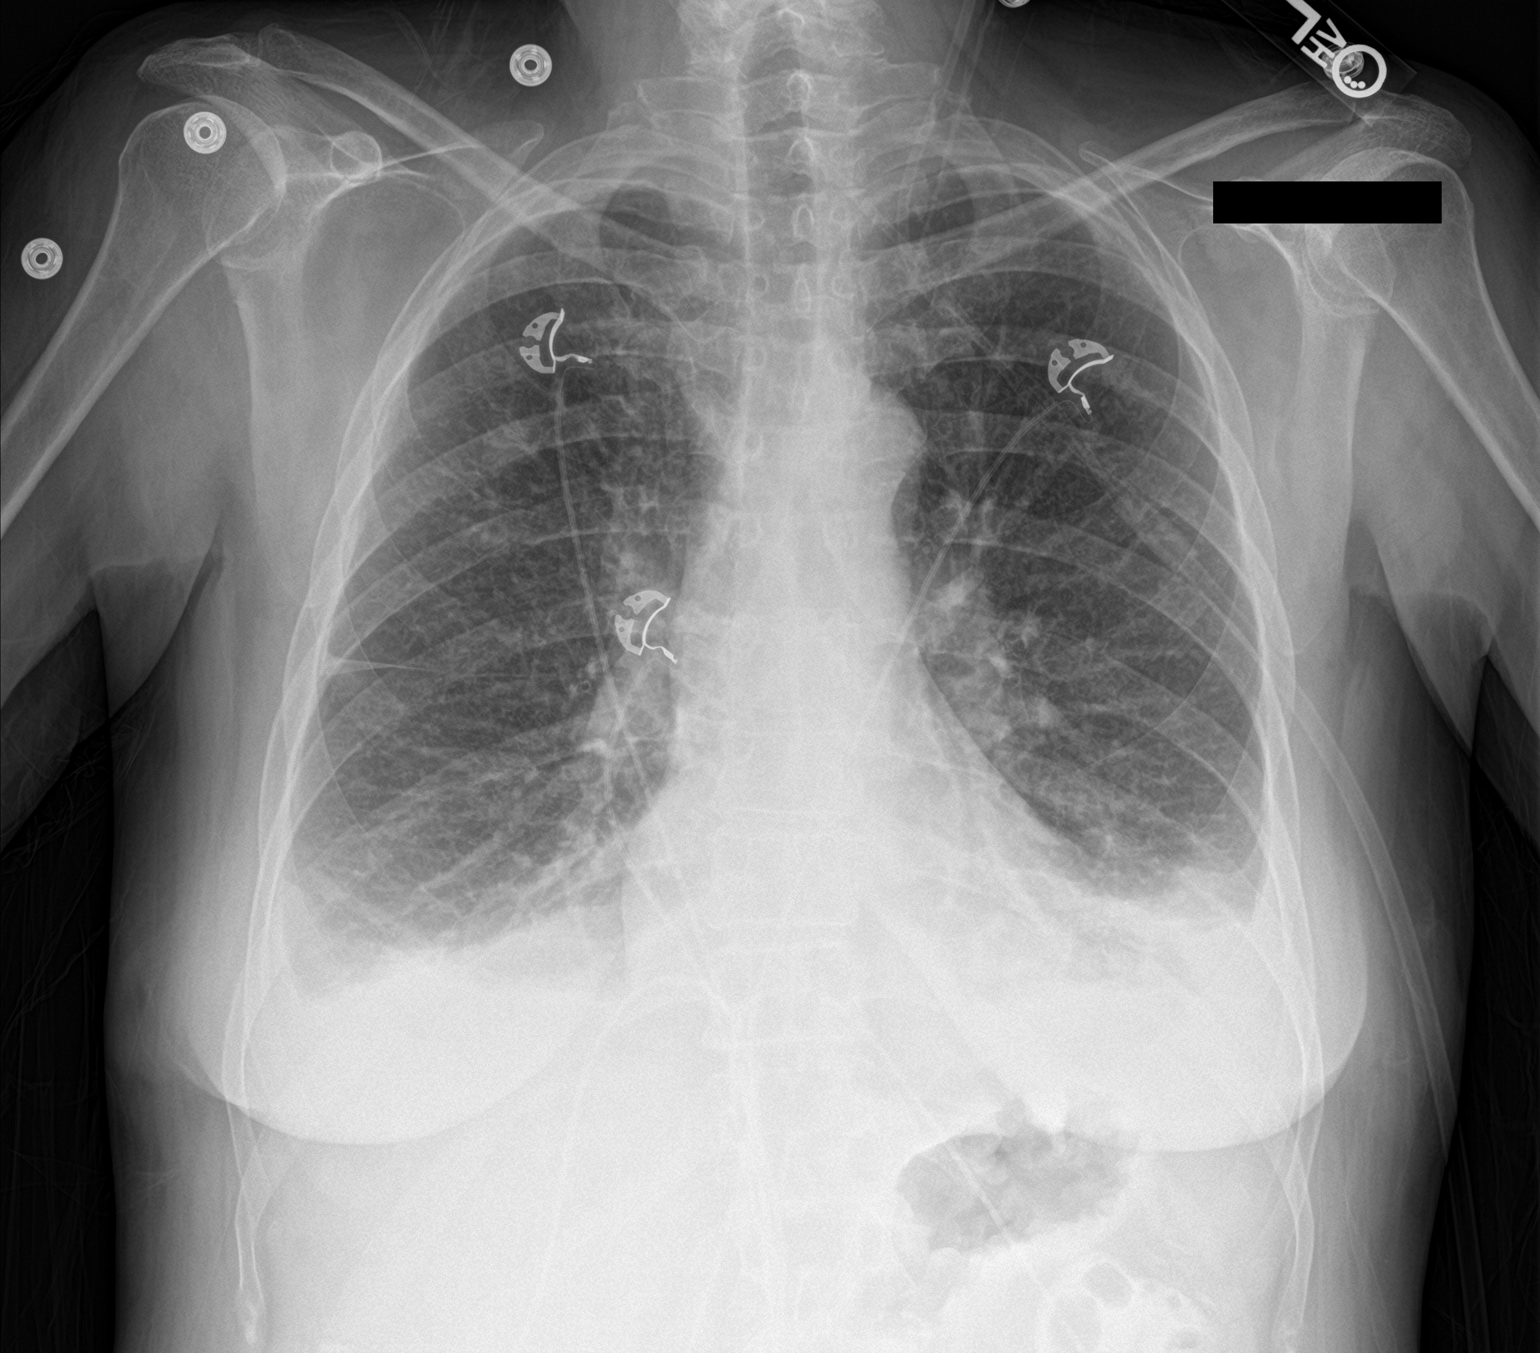

[chest lat]
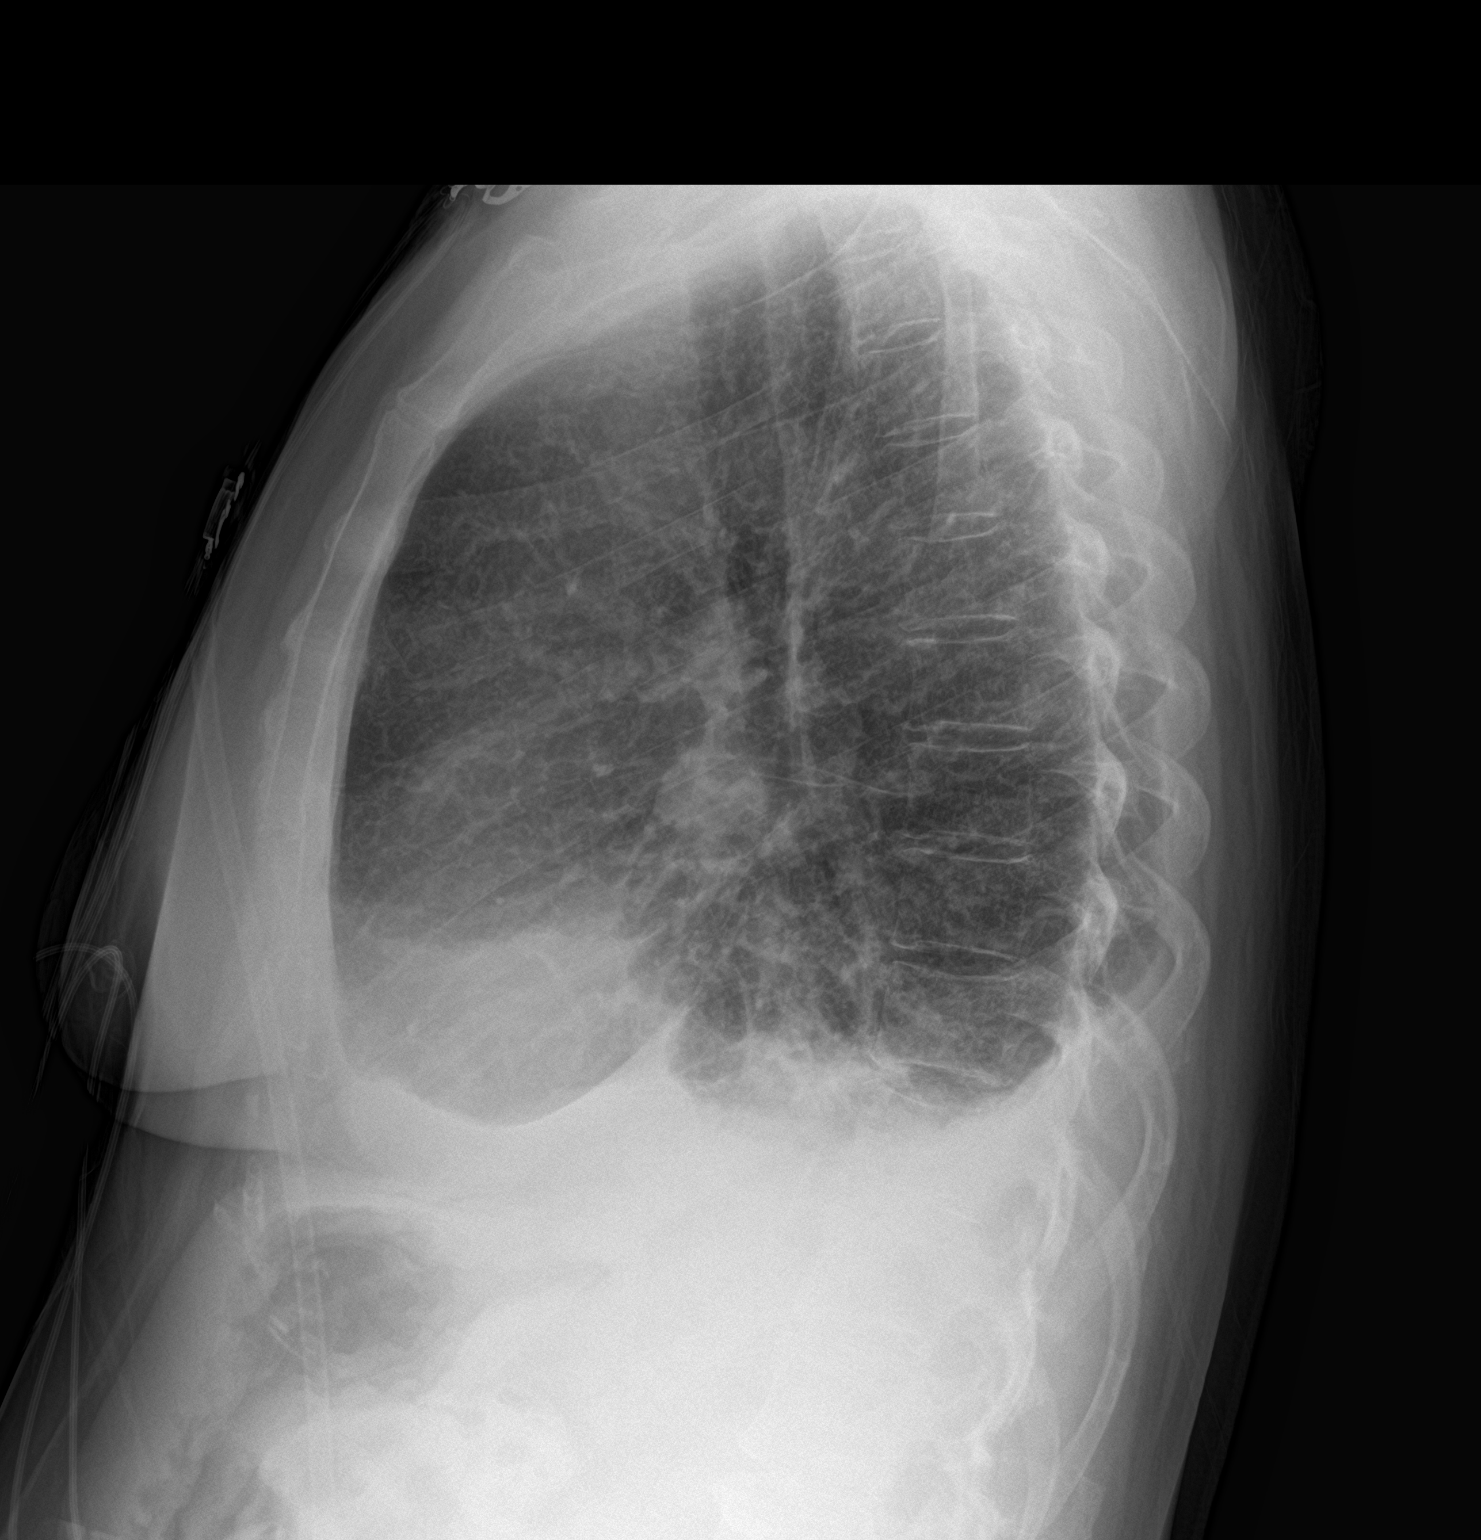

[2 of 2 positions shown; findings below may reference images not displayed]

FINDINGS: Unchanged examination with mild diffuse interstitial pulmonary
opacity and small bilateral pleural effusions.
IMPRESSION: Unchanged diffuse interstitial pulmonary opacity, likely edema, and
small bilateral pleural effusions.

## 2019-03-27 MED ORDER — POTASSIUM CHLORIDE CRYS ER 10 MEQ PO TBCR
30.0000 meq | EXTENDED_RELEASE_TABLET | ORAL | Status: AC
  Administered 2019-03-27 (×2): 30 meq via ORAL
  Filled 2019-03-27 (×2): qty 3

## 2019-03-27 MED ORDER — POTASSIUM CHLORIDE CRYS ER 20 MEQ PO TBCR
40.0000 meq | EXTENDED_RELEASE_TABLET | Freq: Once | ORAL | Status: AC
  Administered 2019-03-27: 40 meq via ORAL
  Filled 2019-03-27: qty 2

## 2019-03-27 MED ORDER — MAGNESIUM SULFATE 2 GM/50ML IV SOLN
2.0000 g | Freq: Once | INTRAVENOUS | Status: AC
  Administered 2019-03-27: 2 g via INTRAVENOUS
  Filled 2019-03-27: qty 50

## 2019-03-27 MED ORDER — SODIUM CHLORIDE 0.9 % IV SOLN
INTRAVENOUS | Status: DC | PRN
  Administered 2019-03-27: 250 mL via INTRAVENOUS

## 2019-03-27 MED ORDER — FUROSEMIDE 10 MG/ML IJ SOLN
20.0000 mg | Freq: Once | INTRAMUSCULAR | Status: AC
  Administered 2019-03-27: 20 mg via INTRAVENOUS
  Filled 2019-03-27: qty 2

## 2019-03-27 NOTE — Plan of Care (Signed)

## 2019-03-27 NOTE — TOC Progression Note (Signed)
Transition of Care [facility]) - Progression Note    Patient Details  Name: Christy Abbott MRN: [id number] Date of Birth: 06/05/1955  [facility]) CM/SW Contact  Zenon Mayo, RN Phone Number: 03/27/2019, 4:59 PM  Clinical Narrative:    NCM spoke with patient, she has [facility], which most oxygen companies are not in network with.  NCM found out that [employer] [facility] is in network, informed patient she states she is ok with [facility] doing her oxygen.  NCM made referral to Jeneen Rinks with [employer] in case she needs home oxygen.  NCM spoke with patient about getting her insulin from [facility] also (reli- on brand), she states that sounds more reasonable in pricing.         Expected Discharge Plan and Services                                                 Social Determinants of Health (SDOH) Interventions    Readmission Risk Interventions No flowsheet data found.

## 2019-03-27 NOTE — Progress Notes (Signed)
  Mobility Specialist Criteria Algorithm Info.  SATURATION QUALIFICATIONS: (This note is used to comply with regulatory documentation for home oxygen)  Patient Saturations on Room Air at Rest = 88%  Patient Saturations on Room Air while Ambulating = n/a%  Patient Saturations on 3 Liters of oxygen while Ambulating = 90%  Please briefly explain why patient needs home oxygen: Patient needed 3LO2 to maintain a saturation >89%. On 2LO2 patient desaturated to 81% and needed a significant amount of time to recover >88%   Mobility:  Activity: Ambulated in hall;Transferred to/from [facility];Transferred:  Bed to chair (to chair after ambulation) Range of motion: Active;All extremities Level of assistance: Independent after set-up Assistive device: None;BSC Minutes sitting in chair: No data recorded Minutes stood: 10 minutes Minutes ambulated: 10 minutes Distance ambulated (ft): 120 ft Mobility response: Tolerated well;RN notified

## 2019-03-27 NOTE — Progress Notes (Signed)
Potassium per lab 2.6, notify provider, follow up new order.

## 2019-03-27 NOTE — Progress Notes (Signed)
PROGRESS NOTE  Christy Abbott JJH:[id number] DOB: [birth year] DOA: 03/21/2019 PCP: [name], NP   LOS: 5 days   Brief Narrative / Interim history: 64 year old female with HTN, DM 2, a flutter previously seen by Dr. Lovena Le, hypothyroidism, neuroendocrine tumor in 2016 treated at [facility], came into the hospital with right-sided chest pain.  On imaging she was found to have potentially small bilateral pleural effusions and bilateral atelectasis/infiltrate.  CT scan of the abdomen and pelvis also showed sigmoid diverticulitis.  Essentially her blood culture speciated E. coli for which she is on ceftriaxone  Subjective / 24h Interval events: Feeling better, wants to go home.  Assessment & Plan: Principal Problem Sepsis due to E. coli bacteremia -Likely source diverticulitis, located in the sigmoid area -Sepsis physiology resolved, her abdomen is nontender, and she is tolerating a regular diet -Currently on ceftriaxone, continue, will transition to orals on discharge  Active Problems Acute chest pain -Main reason why she came into the hospital and still persistent today.  She is quite uncomfortable.  Pain is located in the epigastric area and is pleuritic in nature worsening with deep breaths.  Hypoxic respiratory failure -I wonder whether acute or a chronic component. Had likely underlying COPD and has smoked since age 54, quit just a few days ago -she was net positive and received Laisx 40 mg last night and UOP was 4L with hypokalemia K 2.6 this morning.  -remains quite hypoxic requiring 3L  to barely maintain O2 sats of 90%, per RN she was short of breath with walking however patient denies this -will repeat Lasix this evening but lower dose of 20 given response to 40 -I have consulted pulmonary to evaluate as well, appreciate input  Sigmoid diverticulitis -Continue antibiotics as above, clinically improving and tolerating a diet  DKA/DM 2 -Likely precipitated by acute  infection, initially required insulin drip and now transition to subcutaneous insulin.  Her hemoglobin A1c was elevated to 13.9.  She was placed on Lantus and sliding scale with improvement in her sugars.  Will likely need to be on insulin at home given elevation in A1c, will need teaching and check with case management regarding price  CBG (last 3)  Recent Labs    03/26/19 2107 03/27/19 0525 03/27/19 1115  GLUCAP 139* 100* 117*    Pulmonary nodules -will need outpatient follow-up and repeat imaging by PCP  Hyper thyroidism -Continue methimazole  Tobacco use -She will need to quit  History of SVT/A. Fib/flutter -Cardiology evaluated, continue flecainide, Coreg   Scheduled Meds: . carvedilol  12.5 mg Oral BID WC  . enoxaparin (LOVENOX) injection  40 mg Subcutaneous Q24H  . flecainide  50 mg Oral BID  . furosemide  20 mg Intravenous Once  . insulin aspart  0-15 Units Subcutaneous TID WC  . insulin aspart  0-5 Units Subcutaneous QHS  . insulin glargine  20 Units Subcutaneous Daily  . lidocaine  1 patch Transdermal Q24H  . methimazole  15 mg Oral Daily  . potassium chloride SA  20 mEq Oral Daily  . potassium chloride  40 mEq Oral Once  . simvastatin  10 mg Oral QHS  . sodium chloride flush  3 mL Intravenous Q12H   Continuous Infusions: . sodium chloride 250 mL (03/27/19 0923)  . cefTRIAXone (ROCEPHIN)  IV Stopped (03/27/19 0431)  . diltiazem (CARDIZEM) infusion Stopped (03/22/19 1149)   PRN Meds:.sodium chloride, acetaminophen **OR** acetaminophen, albuterol, dextrose, morphine injection, oxyCODONE  DVT prophylaxis: Lovenox Code Status: Full code Family Communication:  no family at bedside, called and updated daughter  Disposition Plan: home when ready   Consultants:  Cardiology   Procedures:  2D echo:  IMPRESSIONS  1. Definity contrast agent was given IV to delineate the left ventricular endocardial borders.  2. Left ventricular ejection fraction, by visual  estimation, is 60 to 65%. The left ventricle has normal function. There is no left ventricular hypertrophy.  3. Left ventricular diastolic parameters are consistent with Grade I diastolic dysfunction (impaired relaxation).  4. The left ventricle has no regional wall motion abnormalities.  5. Global right ventricle has normal systolic function.The right ventricular size is normal.  6. Left atrial size was normal.  7. Right atrial size was normal.  8. The mitral valve is normal in structure. Trivial mitral valve regurgitation. No evidence of mitral stenosis.  9. The tricuspid valve is normal in structure. 10. The aortic valve was not well visualized. Aortic valve regurgitation is not visualized. 11. The pulmonic valve was not well visualized. Pulmonic valve regurgitation is not visualized. 12. Aortic dilatation noted. 13. There is mild dilatation of the aortic root measuring 40 mm. 14. The inferior vena cava is dilated in size with >50% respiratory variability, suggesting right atrial pressure of 8 mmHg. 15. Technically difficult; definity used; normal LV function; grade 1 diastolic dysfunction; mildly dilated aortic root.  Microbiology  Blood cultures 1/9-pansensitive E. coli  Antimicrobials: Ceftriaxone 1/8 >>   Objective: Vitals:   03/27/19 0028 03/27/19 0437 03/27/19 0728 03/27/19 1112  BP: (!) 108/51 135/72 125/72 119/80  Pulse: 84 83 84 83  Resp: 18 16 19 16   Temp: 99.2 F (37.3 C) 99 F (37.2 C) 98 F (36.7 C) 98.5 F (36.9 C)  TempSrc: Oral Oral Oral Oral  SpO2: 92% 95% 92% 90%  Weight:  68.1 kg    Height:        Intake/Output Summary (Last 24 hours) at 03/27/2019 1605 Last data filed at 03/27/2019 1243 Gross per 24 hour  Intake 1450 ml  Output 2550 ml  Net -1100 ml   Filed Weights   03/25/19 0514 03/26/19 0442 03/27/19 0437  Weight: 69.8 kg 70.2 kg 68.1 kg    Examination:  Constitutional: NAD Eyes: no icterus  ENMT: mmm Neck: normal, supple Respiratory:  CTA biL, no wheezing, no crackles Cardiovascular: RRR, no MRG, no edema Abdomen: soft, NT, ND, BS+ Musculoskeletal: no clubbing / cyanosis.  Skin: no rashes Neurologic: non focal   Data Reviewed: I have independently reviewed following labs and imaging studies   CBC: Recent Labs  Lab 03/22/19 0003 03/22/19 0638 03/23/19 0004 03/25/19 0947  WBC 13.8*  --  11.0* 10.8*  NEUTROABS 11.6*  --   --   --   HGB 16.8* 16.0* 14.7 13.7  HCT 48.1* 47.0* 43.0 40.2  MCV 89.1  --  89.8 90.1  PLT 158  --  130* 962   Basic Metabolic Panel: Recent Labs  Lab 03/22/19 1008 03/22/19 1640 03/23/19 0004 03/23/19 0501 03/24/19 0455 03/24/19 0455 03/24/19 1259 03/25/19 0605 03/26/19 0430 03/27/19 0347 03/27/19 1436  NA 132*   < > 131*   < > 132*  --  133* 137 142 143  --   K 3.0*   < > 3.4*   < > 4.3   < > 4.2 3.0* 3.6 2.6* 3.8  CL 102   < > 102   < > 102  --  102 100 104 96*  --   CO2 16*   < >  17*   < > 16*  --  20* 26 29 34*  --   GLUCOSE 211*   < > 234*   < > 174*  --  200* 120* 99 106*  --   BUN 11   < > 16   < > 20  --  19 7* 5* <5*  --   CREATININE 0.85   < > 0.72   < > 0.65  --  0.65 0.48 0.53 0.52  --   CALCIUM 7.9*   < > 8.1*   < > 8.1*  --  8.2* 8.0* 8.2* 8.4*  --   MG 1.4*  --  1.6*  --   --   --   --   --   --  1.5*  --    < > = values in this interval not displayed.   Liver Function Tests: Recent Labs  Lab 03/22/19 0003  AST 26  ALT 31  ALKPHOS 185*  BILITOT 1.8*  PROT 6.5  ALBUMIN 3.1*   Coagulation Profile: Recent Labs  Lab 03/22/19 1008  INR 1.0   HbA1C: No results for input(s): HGBA1C in the last 72 hours. CBG: Recent Labs  Lab 03/26/19 1115 03/26/19 1622 03/26/19 2107 03/27/19 0525 03/27/19 1115  GLUCAP 132* 115* 139* 100* 117*    Recent Results (from the past 240 hour(s))  Respiratory Panel by RT PCR (Flu A&B, Covid) - Nasopharyngeal Swab     Status: None   Collection Time: 03/22/19  4:24 AM   Specimen: Nasopharyngeal Swab  Result Value Ref  Range Status   SARS Coronavirus 2 by RT PCR NEGATIVE NEGATIVE Final    Comment: (NOTE) SARS-CoV-2 target nucleic acids are NOT DETECTED. The SARS-CoV-2 RNA is generally detectable in upper respiratoy specimens during the acute phase of infection. The lowest concentration of SARS-CoV-2 viral copies this assay can detect is 131 copies/mL. A negative result does not preclude SARS-Cov-2 infection and should not be used as the sole basis for treatment or other patient management decisions. A negative result may occur with  improper specimen collection/handling, submission of specimen other than nasopharyngeal swab, presence of viral mutation(s) within the areas targeted by this assay, and inadequate number of viral copies (<131 copies/mL). A negative result must be combined with clinical observations, patient history, and epidemiological information. The expected result is Negative. Fact Sheet for Patients:  PinkCheek.be Fact Sheet for Healthcare Providers:  GravelBags.it This test is not yet ap proved or cleared by the [place] FDA and  has been authorized for detection and/or diagnosis of SARS-CoV-2 by FDA under an Emergency Use Authorization (EUA). This EUA will remain  in effect (meaning this test can be used) for the duration of the COVID-19 declaration under Section 564(b)(1) of the Act, 21 U.S.C. section 360bbb-3(b)(1), unless the authorization is terminated or revoked sooner.    Influenza A by PCR NEGATIVE NEGATIVE Final   Influenza B by PCR NEGATIVE NEGATIVE Final    Comment: (NOTE) The Xpert Xpress SARS-CoV-2/FLU/RSV assay is intended as an aid in  the diagnosis of influenza from Nasopharyngeal swab specimens and  should not be used as a sole basis for treatment. Nasal washings and  aspirates are unacceptable for Xpert Xpress SARS-CoV-2/FLU/RSV  testing. Fact Sheet for  Patients: PinkCheek.be Fact Sheet for Healthcare Providers: GravelBags.it This test is not yet approved or cleared by the [place] FDA and  has been authorized for detection and/or diagnosis of SARS-CoV-2 by  FDA under an Emergency  Use Authorization (EUA). This EUA will remain  in effect (meaning this test can be used) for the duration of the  Covid-19 declaration under Section 564(b)(1) of the Act, 21  U.S.C. section 360bbb-3(b)(1), unless the authorization is  terminated or revoked. Performed at [facility], [place] [address], [place], [place] [address]   Respiratory Panel by PCR     Status: None   Collection Time: 03/22/19  8:53 AM   Specimen: Nasopharyngeal Swab; Respiratory  Result Value Ref Range Status   Adenovirus NOT DETECTED NOT DETECTED Final   Coronavirus 229E NOT DETECTED NOT DETECTED Final    Comment: (NOTE) The Coronavirus on the Respiratory Panel, DOES NOT test for the novel  Coronavirus (2019 nCoV)    Coronavirus HKU1 NOT DETECTED NOT DETECTED Final   Coronavirus NL63 NOT DETECTED NOT DETECTED Final   Coronavirus OC43 NOT DETECTED NOT DETECTED Final   Metapneumovirus NOT DETECTED NOT DETECTED Final   Rhinovirus / Enterovirus NOT DETECTED NOT DETECTED Final   Influenza A NOT DETECTED NOT DETECTED Final   Influenza B NOT DETECTED NOT DETECTED Final   Parainfluenza Virus 1 NOT DETECTED NOT DETECTED Final   Parainfluenza Virus 2 NOT DETECTED NOT DETECTED Final   Parainfluenza Virus 3 NOT DETECTED NOT DETECTED Final   Parainfluenza Virus 4 NOT DETECTED NOT DETECTED Final   Respiratory Syncytial Virus NOT DETECTED NOT DETECTED Final   Bordetella pertussis NOT DETECTED NOT DETECTED Final   Chlamydophila pneumoniae NOT DETECTED NOT DETECTED Final   Mycoplasma pneumoniae NOT DETECTED NOT DETECTED Final    Comment: Performed at [facility], [place]. [address] [place], [place] [address]  Culture, blood  (x 2)     Status: Abnormal   Collection Time: 03/22/19 10:44 AM   Specimen: BLOOD LEFT HAND  Result Value Ref Range Status   Specimen Description BLOOD LEFT HAND  Final   Special Requests   Final    BOTTLES DRAWN AEROBIC AND ANAEROBIC Blood Culture adequate volume   Culture  Setup Time   Final    IN BOTH AEROBIC AND ANAEROBIC BOTTLES GRAM NEGATIVE RODS CRITICAL VALUE NOTED.  VALUE IS CONSISTENT WITH PREVIOUSLY REPORTED AND CALLED VALUE.    Culture (A)  Final    ESCHERICHIA COLI SUSCEPTIBILITIES PERFORMED ON PREVIOUS CULTURE WITHIN THE LAST 5 DAYS. Performed at [facility], [place] [address], [place], [place] [address]    Report Status 03/25/2019 FINAL  Final  Culture, blood (x 2)     Status: Abnormal   Collection Time: 03/22/19 10:44 AM   Specimen: BLOOD RIGHT HAND  Result Value Ref Range Status   Specimen Description BLOOD RIGHT HAND  Final   Special Requests   Final    BOTTLES DRAWN AEROBIC ONLY Blood Culture adequate volume   Culture  Setup Time   Final    AEROBIC BOTTLE ONLY GRAM NEGATIVE RODS CRITICAL RESULT CALLED TO, READ BACK BY AND VERIFIED WITH: CRYSTAL ROBERTSON PHARM @ 0205 ON 03/23/19 BY ROBINSON Z.  Performed at [facility], [place] [address] [place], [place] [address]    Culture ESCHERICHIA COLI (A)  Final   Report Status 03/25/2019 FINAL  Final   Organism ID, Bacteria ESCHERICHIA COLI  Final      Susceptibility   Escherichia coli - MIC*    AMPICILLIN <=2 SENSITIVE Sensitive     CEFAZOLIN <=4 SENSITIVE Sensitive     CEFEPIME <=0.12 SENSITIVE Sensitive     CEFTAZIDIME <=1 SENSITIVE Sensitive  CEFTRIAXONE <=0.25 SENSITIVE Sensitive     CIPROFLOXACIN <=0.25 SENSITIVE Sensitive     GENTAMICIN <=1 SENSITIVE Sensitive     IMIPENEM <=0.25 SENSITIVE Sensitive     TRIMETH/SULFA <=20 SENSITIVE Sensitive     AMPICILLIN/SULBACTAM <=2 SENSITIVE Sensitive     PIP/TAZO <=4 SENSITIVE Sensitive     * ESCHERICHIA COLI  Blood Culture ID Panel (Reflexed)      Status: Abnormal   Collection Time: 03/22/19 10:44 AM  Result Value Ref Range Status   Enterococcus species NOT DETECTED NOT DETECTED Final   Listeria monocytogenes NOT DETECTED NOT DETECTED Final   Staphylococcus species NOT DETECTED NOT DETECTED Final   Staphylococcus aureus (BCID) NOT DETECTED NOT DETECTED Final   Streptococcus species NOT DETECTED NOT DETECTED Final   Streptococcus agalactiae NOT DETECTED NOT DETECTED Final   Streptococcus pneumoniae NOT DETECTED NOT DETECTED Final   Streptococcus pyogenes NOT DETECTED NOT DETECTED Final   Acinetobacter baumannii NOT DETECTED NOT DETECTED Final   Enterobacteriaceae species DETECTED (A) NOT DETECTED Final    Comment: Enterobacteriaceae represent a large family of gram-negative bacteria, not a single organism. CRITICAL RESULT CALLED TO, READ BACK BY AND VERIFIED WITH: CRYSTAL ROBERTSON PHARM @ 0205 ON 03/23/19 BY ROBINSON Z.     Enterobacter cloacae complex NOT DETECTED NOT DETECTED Final   Escherichia coli DETECTED (A) NOT DETECTED Final    Comment: CRITICAL RESULT CALLED TO, READ BACK BY AND VERIFIED WITH: CRYSTAL ROBERTSON PHARM @ 0205 ON 03/23/19 BY ROBINSON Z.     Klebsiella oxytoca NOT DETECTED NOT DETECTED Final   Klebsiella pneumoniae NOT DETECTED NOT DETECTED Final   Proteus species NOT DETECTED NOT DETECTED Final   Serratia marcescens NOT DETECTED NOT DETECTED Final   Carbapenem resistance NOT DETECTED NOT DETECTED Final   Haemophilus influenzae NOT DETECTED NOT DETECTED Final   Neisseria meningitidis NOT DETECTED NOT DETECTED Final   Pseudomonas aeruginosa NOT DETECTED NOT DETECTED Final   Candida albicans NOT DETECTED NOT DETECTED Final   Candida glabrata NOT DETECTED NOT DETECTED Final   Candida krusei NOT DETECTED NOT DETECTED Final   Candida parapsilosis NOT DETECTED NOT DETECTED Final   Candida tropicalis NOT DETECTED NOT DETECTED Final    Comment: Performed at [facility], [place]. [address], [place],  [place] [address]  C difficile quick scan w PCR reflex     Status: None   Collection Time: 03/23/19  5:37 PM   Specimen: STOOL  Result Value Ref Range Status   C Diff antigen NEGATIVE NEGATIVE Final   C Diff toxin NEGATIVE NEGATIVE Final   C Diff interpretation No C. difficile detected.  Final    Comment: Performed at [facility], [place] [address] [place], [place] [address]     Radiology Studies: DG Chest 2 View  Result Date: 03/27/2019 CLINICAL DATA:  Oxygen release EXAM: CHEST - 2 VIEW COMPARISON:  03/25/2019 FINDINGS: Unchanged examination with mild diffuse interstitial pulmonary opacity and small bilateral pleural effusions. IMPRESSION: Unchanged diffuse interstitial pulmonary opacity, likely edema, and small bilateral pleural effusions. Electronically Signed   By: Eddie Candle M.D.   On: 03/27/2019 15:55    Marzetta Board, MD, PhD [facility]  Between 7 am - 7 pm I am available, please contact me via Amion or Securechat  Between 7 pm - 7 am I am not available, please contact night coverage MD/APP via Amion

## 2019-03-28 ENCOUNTER — Telehealth: Payer: Self-pay | Admitting: Family

## 2019-03-28 DIAGNOSIS — R918 Other nonspecific abnormal finding of lung field: Secondary | ICD-10-CM

## 2019-03-28 DIAGNOSIS — R0902 Hypoxemia: Secondary | ICD-10-CM

## 2019-03-28 DIAGNOSIS — R7881 Bacteremia: Secondary | ICD-10-CM

## 2019-03-28 DIAGNOSIS — B9620 Unspecified Escherichia coli [E. coli] as the cause of diseases classified elsewhere: Secondary | ICD-10-CM

## 2019-03-28 DIAGNOSIS — K5792 Diverticulitis of intestine, part unspecified, without perforation or abscess without bleeding: Secondary | ICD-10-CM

## 2019-03-28 DIAGNOSIS — J9601 Acute respiratory failure with hypoxia: Secondary | ICD-10-CM

## 2019-03-28 LAB — BASIC METABOLIC PANEL
Anion gap: 11 (ref 5–15)
BUN: 6 mg/dL — ABNORMAL LOW (ref 8–23)
CO2: 31 mmol/L (ref 22–32)
Calcium: 8.5 mg/dL — ABNORMAL LOW (ref 8.9–10.3)
Chloride: 97 mmol/L — ABNORMAL LOW (ref 98–111)
Creatinine, Ser: 0.54 mg/dL (ref 0.44–1.00)
GFR calc Af Amer: >60 mL/min (ref >60)
GFR calc non Af Amer: >60 mL/min (ref >60)
Glucose, Bld: 102 mg/dL — ABNORMAL HIGH (ref 70–99)
Potassium: 3.7 mmol/L (ref 3.5–5.1)
Sodium: 139 mmol/L (ref 135–145)

## 2019-03-28 LAB — GLUCOSE, CAPILLARY
Glucose-Capillary: 96 mg/dL (ref 70–99)
Glucose-Capillary: 177 mg/dL — ABNORMAL HIGH (ref 70–99)

## 2019-03-28 MED ORDER — GUAIFENESIN-DM 100-10 MG/5ML PO SYRP
5.0000 mL | ORAL_SOLUTION | ORAL | Status: DC | PRN
  Administered 2019-03-28: 5 mL via ORAL
  Filled 2019-03-28: qty 5

## 2019-03-28 MED ORDER — POTASSIUM CHLORIDE 20 MEQ/15ML (10%) PO SOLN
40.0000 meq | Freq: Once | ORAL | Status: AC
  Administered 2019-03-28: 40 meq via ORAL
  Filled 2019-03-28: qty 30

## 2019-03-28 MED ORDER — SENNA 8.6 MG PO TABS: 1.0000 | ORAL_TABLET | Freq: Two times a day (BID) | ORAL | 0 refills | Status: DC

## 2019-03-28 MED ORDER — GLIMEPIRIDE 4 MG PO TABS: 4.0000 mg | ORAL_TABLET | Freq: Every day | ORAL | Status: DC

## 2019-03-28 MED ORDER — CEPHALEXIN 500 MG PO CAPS: 500.0000 mg | ORAL_CAPSULE | Freq: Three times a day (TID) | ORAL | 0 refills | Status: AC

## 2019-03-28 MED ORDER — METRONIDAZOLE 500 MG PO TABS: 500.0000 mg | ORAL_TABLET | Freq: Three times a day (TID) | ORAL | 0 refills | Status: AC

## 2019-03-28 MED ORDER — OXYCODONE HCL 5 MG PO TABS: 5.0000 mg | ORAL_TABLET | Freq: Four times a day (QID) | ORAL | 0 refills | Status: DC | PRN

## 2019-03-28 MED ORDER — MAGNESIUM SULFATE 2 GM/50ML IV SOLN
2.0000 g | Freq: Once | INTRAVENOUS | Status: AC
  Administered 2019-03-28: 2 g via INTRAVENOUS
  Filled 2019-03-28: qty 50

## 2019-03-28 MED ORDER — KETOROLAC TROMETHAMINE 30 MG/ML IJ SOLN
30.0000 mg | Freq: Three times a day (TID) | INTRAMUSCULAR | Status: DC
  Administered 2019-03-28: 30 mg via INTRAVENOUS
  Filled 2019-03-28: qty 1

## 2019-03-28 MED ORDER — FUROSEMIDE 10 MG/ML IJ SOLN
40.0000 mg | Freq: Once | INTRAMUSCULAR | Status: AC
  Administered 2019-03-28: 40 mg via INTRAVENOUS
  Filled 2019-03-28: qty 4

## 2019-03-28 MED ORDER — FUROSEMIDE 20 MG PO TABS: 40.0000 mg | ORAL_TABLET | Freq: Every day | ORAL | 0 refills | Status: DC

## 2019-03-28 MED ORDER — ALBUTEROL SULFATE HFA 108 (90 BASE) MCG/ACT IN AERS: 2.0000 | INHALATION_SPRAY | Freq: Four times a day (QID) | RESPIRATORY_TRACT | 0 refills | Status: DC | PRN

## 2019-03-28 MED ORDER — CEPHALEXIN 500 MG PO CAPS: 500.0000 mg | ORAL_CAPSULE | Freq: Three times a day (TID) | ORAL | 0 refills | Status: DC

## 2019-03-28 MED ORDER — GUAIFENESIN-DM 100-10 MG/5ML PO SYRP: 5.0000 mL | ORAL_SOLUTION | ORAL | 0 refills | Status: DC | PRN

## 2019-03-28 NOTE — Progress Notes (Signed)
SATURATION QUALIFICATIONS: (This note is used to comply with regulatory documentation for home oxygen)  Patient Saturations on Room Air at Rest = 81%  Patient Saturations on Room Air while Ambulating = 81%  Patient Saturations on 1 Liter of oxygen while Ambulating = 92%  Please briefly explain why patient needs home oxygen: Patient requires home O2 secondary to desaturation with activity/ambulation/sleep.

## 2019-03-28 NOTE — Plan of Care (Signed)
  Problem: Education: Goal: Knowledge of General Education information will improve Description: Including pain rating scale, medication(s)/side effects and non-pharmacologic comfort measures 03/28/2019 1542 by Sue Lush, RN Outcome: Completed/Met 03/28/2019 1531 by Sue Lush, RN Outcome: Progressing 03/28/2019 1531 by Sue Lush, RN Outcome: Progressing   Problem: Health Behavior/Discharge Planning: Goal: Ability to manage health-related needs will improve 03/28/2019 1542 by Sue Lush, RN Outcome: Completed/Met 03/28/2019 1531 by Sue Lush, RN Outcome: Progressing 03/28/2019 1531 by Sue Lush, RN Outcome: Progressing   Problem: Clinical Measurements: Goal: Ability to maintain clinical measurements within normal limits will improve 03/28/2019 1542 by Sue Lush, RN Outcome: Completed/Met 03/28/2019 1531 by Sue Lush, RN Outcome: Progressing 03/28/2019 1531 by Otho Bellows D, RN Outcome: Progressing Goal: Will remain free from infection 03/28/2019 1542 by Sue Lush, RN Outcome: Completed/Met 03/28/2019 1531 by Sue Lush, RN Outcome: Progressing 03/28/2019 1531 by Otho Bellows D, RN Outcome: Progressing Goal: Diagnostic test results will improve 03/28/2019 1542 by Sue Lush, RN Outcome: Completed/Met 03/28/2019 1531 by Sue Lush, RN Outcome: Progressing 03/28/2019 1531 by Otho Bellows D, RN Outcome: Progressing Goal: Respiratory complications will improve 03/28/2019 1542 by Sue Lush, RN Outcome: Completed/Met 03/28/2019 1531 by Sue Lush, RN Outcome: Progressing 03/28/2019 1531 by Otho Bellows D, RN Outcome: Progressing Goal: Cardiovascular complication will be avoided 03/28/2019 1542 by Sue Lush, RN Outcome: Completed/Met 03/28/2019 1531 by Sue Lush, RN Outcome: Progressing 03/28/2019 1531 by Otho Bellows D, RN Outcome: Progressing   Problem: Coping: Goal: Level of anxiety will  decrease 03/28/2019 1542 by Sue Lush, RN Outcome: Completed/Met 03/28/2019 1531 by Sue Lush, RN Outcome: Progressing 03/28/2019 1531 by Otho Bellows D, RN Outcome: Progressing   Problem: Pain Managment: Goal: General experience of comfort will improve 03/28/2019 1542 by Sue Lush, RN Outcome: Completed/Met 03/28/2019 1531 by Sue Lush, RN Outcome: Progressing 03/28/2019 1531 by Otho Bellows D, RN Outcome: Progressing   Problem: Safety: Goal: Ability to remain free from injury will improve 03/28/2019 1542 by Sue Lush, RN Outcome: Completed/Met 03/28/2019 1531 by Sue Lush, RN Outcome: Progressing 03/28/2019 1531 by Sue Lush, RN Outcome: Progressing

## 2019-03-28 NOTE — Telephone Encounter (Signed)
Patient needs 1 week follow up with me- face to face please.

## 2019-03-28 NOTE — Consult Note (Addendum)
NAME:  Christy Abbott, MRN:  [id number], DOB:  1955/05/17, LOS: 6 ADMISSION DATE:  03/21/2019, CONSULTATION DATE:  03/28/2019 REFERRING MD: Cruzita Lederer , CHIEF COMPLAINT:  COPD, Acute respiratory failure , pulmonary nodules concerning for bronchogenic cancer   Brief History   64 year old  With HTN, A flutter, DM, Current every day smoker with a 72 pack year smoking history, and nodules on CTA concerning for possible bronchogenic cancer vs infection. CXR with effusions and pulmonary edema.  Admitted and treated for Ecoli Bacteremia with persistent hypoxia this hospitalization. CTA negative, Echo ok, PCCM consulted for concern for COPD with non-classic presentation, and pulmonary nodules.  History of present illness   64 year old  With HTN, A flutter, DM, Current every day smoker with a 72 pack year smoking history, and nodules on CTA concerning for possible bronchogenic cancer vs infection. CXR with effusions and pulmonary edema.  Admitted and treated for Ecoli Bacteremia with persistent hypoxia this hospitalization. CTA negative, Echo ok. She was requiring 4 L oxygen 1/14,  she was diuresed with a 4 L  Output and improvement in her respiratory status. PCCM have been consulted for COPD evaluation and management of pulmonary nodules.  Past Medical History   Past Medical History:  Diagnosis Date  . Atrial flutter [facility])    s/p CTI by Dr Lovena Le 02/2013  . Chest pain   . Diabetes mellitus without complication ([place])   . 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 Hemochromatosis associated with compound heterozygous mutation in HFE gene ([place]) 12/10/2017  . Hypertension   . Hyperthyroidism 10/31/2014     Significant Hospital Events   03/22/2019 Admission to [facility] inpatient  Consults:  03/28/2019>> Pulmonary  Procedures:    Significant Diagnostic Tests:  CTA:  No pulmonary embolus to the segmental level. Multiple nodular opacities in both lungs, the largest of which measures  2.0 cm. These are concerning for malignancy. Non-contrast chest CT at 3-6 months is recommended. Micro Data:  03/23/2019>> C Diff>> Negative 03/22/2019 Blood Cx:>> E Coli, Enterobacteriaceae  Antimicrobials:  Rocephin 03/23/2019>>  Interim history/subjective:  After diuresis 03/27/2019 ( 4 L ) pt was weaned to 1 L [place] overnight with sats of 90% at rest. Dr. Tamala Julian walked patient off RA, and she dropped her sats to 81% after less than 1 length of the hall. Potassium 3.7 Mag 1.5 on 1/14>> Will replete Continues to have discomfort to right chest with deep breath  Objective   Blood pressure 123/75, pulse 77, temperature 98.2 F (36.8 C), temperature source Oral, resp. rate 16, height 5\' 4"  (1.626 m), weight 66.3 kg, SpO2 91 %.        Intake/Output Summary (Last 24 hours) at 03/28/2019 1000 Last data filed at 03/28/2019 0903 Gross per 24 hour  Intake 601.69 ml  Output 2475 ml  Net -1873.31 ml   Filed Weights   03/26/19 0442 03/27/19 0437 03/28/19 0430  Weight: 70.2 kg 68.1 kg 66.3 kg    Examination: General: Awake and alert, in NAD, on Oxygen at 1 L HENT: NCAT, PERRLA, No LAD Lungs:Bilateral chest excursion, Crackles per bases bilaterally, diminished bilaterally per bases. Cardiovascular: S1, S2, RRR , SR with PVC's per tele Abdomen: Soft, NT, ND, BS + Extremities: No obvious deformities, Normal muscle bulk and tone Neuro: Cranial Nerves intact, appropriate GU: Not assessed  Resolved Hospital Problem list     Assessment & Plan:  Acute on Chronic Respiratory Failure 2/2 COPD . Inflammation 2/2 hematologic  spread of bacteria , and Pulmonary edema/ effusions  post volume resuscitation for gram negative sepsis EF 60-65% Plan Continue Diuresis 1/15 ( Additional Lasix ordered and potassium and Mag repleted) Will need to be sent home on po lasix ( Per  Patient she has not been compliant with her home lasix)  Close outpatient follow up of renal function on diuretic Will need to be  sent home on oxygen Saturation goals are 88-92% Aggressive pulmonary toilet OOB to chair IS Q 1 while awake Will add low dose Toradol x 6 doses for inspiratory chest pain to facilitate deep breathing Ambulatory oxygen saturation prior to discharge  Pulmonary Nodules , largest measuring 2.0 cm per CTA 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 Tamala Julian 04/29/2019 Close Pulmonary Follow up with Dr. Tamala Julian in 1-2 weeks post discharge Needs outpatient PFT's and evaluation for maintenance inhalers Needs referral for lung cancer screening if PET scan is negative for   E coli Sepsis  Off pressors Likely source diverticulitis, located in the sigmoid area Plan Continue antibiotics x 3 weeks total , po dosing as an outpatient Close follow up with PCP with CBC/ BMET on hospital follow up [facility] Follow up 2 weeks post discharge   All other problem management per Primary Team   From Pulmonary standpoint, ok to go home on oxygen with oral antibiotic dosing for total of 3 weeks of therapy, and daily lasix with close follow up of PCP with  Lab work to monitor for electrolyte abnormalities. Outpatient pulmonary follow up has been scheduled.  Follow up will need PET scan in 4 weeks 04/28/2019   Best practice:  Diet: Per Primary team Pain/Anxiety/Delirium protocol (if indicated):  VAP protocol (if indicated): NA DVT prophylaxis: Per Primary GI prophylaxis: Per primary Glucose control: CBG and SSI per Primary team Mobility: OOB to chair, walk in room Code Status: Full Family Communication: Pt. Updated at bedside, no other family available Disposition: Progressive with  discharge per Primary team  Labs   CBC: Recent Labs  Lab 03/22/19 0003 03/22/19 0638 03/23/19 0004 03/25/19 0947  WBC 13.8*  --  11.0* 10.8*  NEUTROABS 11.6*  --   --   --   HGB 16.8* 16.0* 14.7 13.7  HCT 48.1* 47.0* 43.0 40.2  MCV 89.1  --  89.8 90.1  PLT 158  --  130* 818    Basic Metabolic Panel: Recent Labs  Lab 03/22/19 1008 03/22/19 1640 03/23/19 0004 03/23/19 0501 03/24/19 1259 03/24/19 1259 03/25/19 0605 03/26/19 0430 03/27/19 0347 03/27/19 1436 03/28/19 0400  NA 132*   < > 131*   < > 133*  --  137 142 143  --  139  K 3.0*   < > 3.4*   < > 4.2   < > 3.0* 3.6 2.6* 3.8 3.7  CL 102   < > 102   < > 102  --  100 104 96*  --  97*  CO2 16*   < > 17*   < > 20*  --  26 29 34*  --  31  GLUCOSE 211*   < > 234*   < > 200*  --  120* 99 106*  --  102*  BUN 11   < > 16   < > 19  --  7* 5* <5*  --  6*  CREATININE 0.85   < > 0.72   < > 0.65  --  0.48 0.53 0.52  --  0.54  CALCIUM 7.9*   < > 8.1*   < > 8.2*  --  8.0* 8.2* 8.4*  --  8.5*  MG 1.4*  --  1.6*  --   --   --   --   --  1.5*  --   --    < > = values in this interval not displayed.   GFR: Estimated Creatinine Clearance: 67.4 mL/min (by C-G formula based on SCr of 0.54 mg/dL). Recent Labs  Lab 03/22/19 0003 03/22/19 1008 03/22/19 2025 03/23/19 0004 03/24/19 2130 03/25/19 0605 03/25/19 0947 03/26/19 0430  PROCALCITON  --  12.90  --   --  3.41 2.80  --  1.67  WBC 13.8*  --   --  11.0*  --   --  10.8*  --   LATICACIDVEN  --  1.6 1.0  --   --   --   --   --     Liver Function Tests: Recent Labs  Lab 03/22/19 0003  AST 26  ALT 31  ALKPHOS 185*  BILITOT 1.8*  PROT 6.5  ALBUMIN 3.1*   Recent Labs  Lab 03/22/19 0424  LIPASE 18   No results for input(s): AMMONIA in the last 168 hours.  ABG    Component Value Date/Time   HCO3 17.2 (L) 03/22/2019 0638   TCO2 18 (L) 03/22/2019 0638   ACIDBASEDEF 8.0 (H) 03/22/2019 0638   O2SAT 76.0 03/22/2019 0638     Coagulation Profile: Recent Labs  Lab  03/22/19 1008  INR 1.0    Cardiac Enzymes: No results for input(s): CKTOTAL, CKMB, CKMBINDEX, TROPONINI in the last 168 hours.  HbA1C: Hgb A1c MFr Bld  Date/Time Value Ref Range Status  03/23/2019 12:04 AM 13.9 (H) 4.8 - 5.6 % Final    Comment:    (NOTE) Pre diabetes:          5.7%-6.4% Diabetes:              >6.4% Glycemic control for   <7.0% adults with diabetes   05/28/2018 03:22 PM 10.9 (H) 4.6 - 6.5 % Final    Comment:    Glycemic Control Guidelines for People with Diabetes:Non Diabetic:  <6%Goal of Therapy: <7%Additional Action Suggested:  >8%     CBG: Recent Labs  Lab 03/27/19 0525 03/27/19 1115 03/27/19 1621 03/27/19 2116 03/28/19 0542  GLUCAP 100* 117* 180* 112* 96    Review of Systems:   Gen: Denies fever, chills, weight change, +  fatigue, No night sweats HEENT: Denies blurred vision, double vision, hearing loss, tinnitus, sinus congestion, rhinorrhea, sore throat, neck stiffness, dysphagia PULM: Denies shortness of breath, rare non-productive cough, Minimal  sputum production, hemoptysis, wheeze CV: + chest pain with inspiration,No  edema, orthopnea, paroxysmal nocturnal dyspnea, palpitations GI: Denies abdominal pain, nausea, vomiting, diarrhea, hematochezia, melena, constipation, change in bowel habits GU: Denies dysuria, hematuria, polyuria, oliguria, urethral discharge Endocrine: Denies hot or cold intolerance, polyuria, polyphagia or appetite change Derm: Denies rash, dry skin, scaling or peeling skin change Heme: Denies easy bruising, bleeding, bleeding gums Neuro: Denies headache, numbness, weakness, slurred speech, loss of memory or consciousness  Past Medical History  She,  has a past  medical history of Atrial flutter [facility]), Chest pain, Diabetes mellitus without complication ([place]), Gastric tumor (3/16), Hemochromatosis associated with compound heterozygous mutation in HFE gene ([place]) (12/10/2017), Hypertension, and Hyperthyroidism (10/31/2014).    Surgical History    Past Surgical History:  Procedure Laterality Date  . ABLATION  03-04-2013   CTI by Dr Lovena Le for atrial flutter  . ATRIAL FLUTTER ABLATION N/A 03/04/2013   Procedure: ATRIAL FLUTTER ABLATION;  Surgeon: Evans Lance, MD;  Location: [facility] CATH LAB;  Service: Cardiovascular;  Laterality: N/A;  . CHOLECYSTECTOMY  1982  . ESOPHAGOGASTRODUODENOSCOPY N/A 05/20/2014   Procedure: ESOPHAGOGASTRODUODENOSCOPY (EGD);  Surgeon: Teena Irani, MD;  Location: [facility] ENDOSCOPY;  Service: Endoscopy;  Laterality: N/A;  . TONSILLECTOMY  1972 ?     Social History   reports that she has quit smoking. Her smoking use included cigarettes. She smoked 1.00 pack per day. She has never used smokeless tobacco. She reports that she does not drink alcohol or use drugs.   Family History   Her family history includes Arrhythmia in her mother; Atrial fibrillation in her mother; Diabetes in her mother; Hodgkin's lymphoma in her father.   Allergies Allergies  Allergen Reactions  . Jardiance [Empagliflozin]     Causes heart palpitations  . Trazodone And Nefazodone     "hyped me up, could not sleep"     Home Medications  Prior to Admission medications   Medication Sig Start Date End Date Taking? Authorizing Provider  amLODipine (NORVASC) 10 MG tablet Take 1 tablet by mouth once daily 03/05/19  Yes [name], NP  Bioflavonoid Products (ESTER C PO) Take 1 tablet by mouth daily.   Yes [provider]  Calcium Carb-Cholecalciferol (CALTRATE 600+D3 PO) Take 1 tablet by mouth daily.   Yes [provider]  carvedilol (COREG) 12.5 MG tablet TAKE 1 TABLET BY MOUTH TWICE DAILY ( PATIENT MUST KEEP UPCOMING APPOINTMENT ) Patient taking differently: Take 12.5 mg by mouth 2 (two) times daily with a meal. TAKE 1 TABLET BY MOUTH TWICE DAILY ( PATIENT MUST KEEP UPCOMING APPOINTMENT ) 04/25/18  Yes Jettie Booze, MD  flecainide (TAMBOCOR) 50 MG tablet TAKE 1 TABLET BY MOUTH TWICE DAILY,  MAY TAKE AN ADDITIONAL 1 TABLET AS NEEDED. Please keep upcoming appt in January. Thank you Patient taking differently: Take 50 mg by mouth 2 (two) times daily. TAKE 1 TABLET BY MOUTH TWICE DAILY, MAY TAKE AN ADDITIONAL 1 TABLET AS NEEDED. Please keep upcoming appt in January. Thank you 02/18/19  Yes Jettie Booze, MD  furosemide (LASIX) 20 MG tablet Take 1 tablet (20 mg total) by mouth daily. 05/28/18  Yes [name], NP  glimepiride (AMARYL) 4 MG tablet Take 0.5 tablets (2 mg total) by mouth daily with breakfast. TAKE 1 TABLET BY MOUTH ONCE DAILY BEFORE BREAKFAST Patient taking differently: Take 4 mg by mouth daily with breakfast. TAKE 1 TABLET BY MOUTH ONCE DAILY BEFORE BREAKFAST 06/09/18  Yes [name], NP  lisinopril-hydrochlorothiazide (PRINZIDE,ZESTORETIC) 20-25 MG tablet Take 1 tablet by mouth daily. 05/28/18  Yes [name], NP  metFORMIN (GLUCOPHAGE) 500 MG tablet Take 2 tablets (1,000 mg total) by mouth 2 (two) times daily. 07/26/18  Yes [name], NP  methimazole (TAPAZOLE) 10 MG tablet Take 1.5 tablets (15 mg total) by mouth daily. 05/28/18  Yes [name], NP  Omega-3 Fatty Acids (FISH OIL) 1200 MG CAPS Take 1,200 mg by mouth daily.    Yes [provider]  potassium chloride SA (KLOR-CON) 20 MEQ tablet  TAKE 1  BY MOUTH ONCE DAILY Patient taking differently: Take 20 mEq by mouth daily. TAKE 1  BY MOUTH ONCE DAILY 03/05/19  Yes [name], NP  simvastatin (ZOCOR) 10 MG tablet TAKE 1 TABLET BY MOUTH AT BEDTIME Patient taking differently: Take 10 mg by mouth at bedtime.  03/05/19  Yes [name], NP  insulin NPH Human (HUMULIN N,NOVOLIN N) 100 UNIT/ML injection Inject 0.05 mLs (5 Units total) into the skin 2 (two) times daily before a meal. Patient not taking: Reported on 03/22/2019 06/09/18   [name], NP  Insulin Syringe-Needle U-100 25G X 5/8" 1 ML MISC Use as directed 06/09/18   [name], NP   Lancets 30G MISC Check sugar twice daily. 06/09/18   [name], NP  pioglitazone (ACTOS) 30 MG tablet Take 1 tablet (30 mg total) by mouth daily. Patient not taking: Reported on 03/22/2019 05/28/18   [name], NP     Critical care time: 55 minutes    Magdalen Spatz, MSN, AGACNP-BC [place] Pager # [phone] After 4 pm please call [phone] 03/28/2019  10:03 AM

## 2019-03-28 NOTE — Discharge Summary (Signed)
Physician Discharge Summary  Christy Abbott XBJ:[id number] DOB: 02-18-1956 DOA: 03/21/2019  PCP: [name], NP  Admit date: 03/21/2019 Discharge date: 03/28/2019  Admitted From: Home Disposition: Home  Recommendations for Outpatient Follow-up:  1. Follow up with PCP in 1 week with repeat CBC/BMP 2. Outpatient follow-up with pulmonary/cardiology 3. Follow up in ED if symptoms worsen or new appear   Home Health: No Equipment/Devices: Oxygen via nasal cannula at 2 L/min.  Oxygen saturations dropped into the 80s on ambulation on room air.  Discharge Condition: Stable CODE STATUS: Full Diet recommendation: Heart healthy/carb modified  Brief/Interim Summary: 64 year old female with history of hypertension, diabetes mellitus type 2, unspecified atrial flutter, hypothyroidism, neuroendocrine tumor in 2016 treated at [facility] presented with right-sided chest pain.  On imaging, she was found to have potentially small bilateral pleural effusions and bilateral atelectasis/infiltrate.  CT of the abdomen and pelvis showed acute sigmoid diverticulitis.  She was found to have E. coli bacteremia.  She was treated with IV Rocephin.  During the hospitalization, cardiology was also consulted who subsequently signed off and recommended outpatient follow-up.  She also was hypoxic and required supplemental oxygen.  She was given intravenous Lasix.  Pulmonary evaluated patient and recommended outpatient follow-up and to continue 3 weeks of antibiotic therapy for pulmonary nodules with concern for infection versus bronchogenic cancer.  Currently still requiring 2 L oxygen via nasal cannula.  Will discharge home on oxygen along with antibiotics.  Outpatient follow-up with pulmonary/cardiology and PCP.  Discharge Diagnoses:  Sepsis: Present on admission E. coli bacteremia Acute sigmoid diverticulitis -Sepsis has resolved.  Currently on Rocephin. -Discharged on oral Keflex and Flagyl.  Flagyl for 5 more  days. -Tolerating diet.  Currently no nausea or vomiting.  Might need outpatient GI evaluation to determine the need for colonoscopy.  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  Patient required up to 4 L oxygen via nasal cannula during the hospitalization.  She was given intermittent intravenous Lasix.  She is still requiring 2 L oxygen and her oxygen saturations dropped down to the 80s on room air.  She will be discharged home on 2 L oxygen.  Will give 1 more dose of IV Lasix prior to discharge.  On discharge, she will be put on Lasix 40 mg daily. -Also will continue Keflex for 2 more weeks on discharge to finish 3 weeks of antibiotic therapy as per pulmonary recommendations.  Outpatient follow-up closely with pulmonary.  She will need outpatient PFTs and lung cancer screening. -She needs to stop smoking.  Chest pain -Questionable cause.  May be pleuritic.  Improving.  Cardiology has signed off.  Outpatient follow-up with cardiology  Unspecified atrial flutter -Cardiology has evaluated the patient.  Cardiology recommends to continue Coreg and flecainide.  Outpatient follow-up.  DKA/diabetes mellitus type 2 -A1c 13.9.  Initially required insulin drip and was transitioned to long-acting insulin.  Blood sugars are on the lower side.  She is hesitant to continue insulin as an outpatient for now.  Will resume home regimen and patient needs to follow-up closely with PCP regarding the need to initiate insulin.  Hyperthyroidism -Continue methimazole   Discharge Instructions  Discharge Instructions    Ambulatory referral to Cardiology   Complete by: As directed    Ambulatory referral to Pulmonology   Complete by: As directed    Hypoxia   Diet - low sodium  heart healthy   Complete by: As directed    Diet Carb Modified    Complete by: As directed    Increase activity slowly   Complete by: As directed      Allergies as of 03/28/2019      Reactions   Jardiance [empagliflozin]    Causes heart palpitations   Trazodone And Nefazodone    "hyped me up, could not sleep"      Medication List    STOP taking these medications   amLODipine 10 MG tablet Commonly known as: NORVASC   insulin NPH Human 100 UNIT/ML injection Commonly known as: NOVOLIN N   Insulin Syringe-Needle U-100 25G X 5/8" 1 ML Misc     TAKE these medications   albuterol 108 (90 Base) MCG/ACT inhaler Commonly known as: VENTOLIN HFA Inhale 2 puffs into the lungs every 6 (six) hours as needed for wheezing or shortness of breath.   CALTRATE 600+D3 PO Take 1 tablet by mouth daily.   carvedilol 12.5 MG tablet Commonly known as: COREG TAKE 1 TABLET BY MOUTH TWICE DAILY ( PATIENT MUST KEEP UPCOMING APPOINTMENT ) What changed:   how much to take  how to take this  when to take this   cephALEXin 500 MG capsule Commonly known as: KEFLEX Take 1 capsule (500 mg total) by mouth 3 (three) times daily for 5 days.   ESTER C PO Take 1 tablet by mouth daily.   Fish Oil 1200 MG Caps Take 1,200 mg by mouth daily.   flecainide 50 MG tablet Commonly known as: TAMBOCOR TAKE 1 TABLET BY MOUTH TWICE DAILY, MAY TAKE AN ADDITIONAL 1 TABLET AS NEEDED. Please keep upcoming appt in January. Thank you What changed:   how much to take  how to take this  when to take this   furosemide 20 MG tablet Commonly known as: LASIX Take 2 tablets (40 mg total) by mouth daily. What changed: how much to take   glimepiride 4 MG tablet Commonly known as: AMARYL Take 1 tablet (4 mg total) by mouth daily with breakfast. TAKE 1 TABLET BY MOUTH ONCE DAILY BEFORE BREAKFAST   guaiFENesin-dextromethorphan 100-10 MG/5ML syrup Commonly known as: ROBITUSSIN DM Take 5 mLs by mouth every 4 (four) hours as needed for cough.   Lancets 30G Misc Check sugar  twice daily.   lisinopril-hydrochlorothiazide 20-25 MG tablet Commonly known as: ZESTORETIC Take 1 tablet by mouth daily.   metFORMIN 500 MG tablet Commonly known as: GLUCOPHAGE Take 2 tablets (1,000 mg total) by mouth 2 (two) times daily.   methimazole 10 MG tablet Commonly known as: TAPAZOLE Take 1.5 tablets (15 mg total) by mouth daily.   metroNIDAZOLE 500 MG tablet Commonly known as: Flagyl Take 1 tablet (500 mg total) by mouth 3 (three) times daily for 5 days.   oxyCODONE 5 MG immediate release tablet Commonly known as: Oxy IR/ROXICODONE Take 1 tablet (5 mg total) by mouth every 6 (six) hours as needed for moderate pain or breakthrough pain.   pioglitazone 30 MG tablet Commonly known as: ACTOS Take 1 tablet (30 mg total) by mouth daily.   potassium chloride SA 20 MEQ tablet Commonly known as: KLOR-CON TAKE 1  BY MOUTH ONCE DAILY What changed:   how much to take  how to take this  when to take this   senna 8.6 MG Tabs tablet Commonly known as: SENOKOT Take 1 tablet (8.6 mg total) by mouth 2 (two) times daily.   simvastatin 10 MG tablet  Commonly known as: ZOCOR TAKE 1 TABLET BY MOUTH AT BEDTIME            Durable Medical Equipment  (From admission, onward)         Start     Ordered   03/28/19 1121  DME Oxygen  Once    Question Answer Comment  Length of Need 6 Months   Mode or (Route) Nasal cannula   Liters per Minute 2   Frequency Continuous (stationary and portable oxygen unit needed)   Oxygen conserving device Yes   Oxygen delivery system Gas      03/28/19 1122         Follow-up Information    Jettie Booze, MD Follow up on 04/08/2019.   Specialties: Cardiology, Radiology, Interventional Cardiology Why: at 1115 with his PA [place] information: [address] [place] [address] [phone]        Magdalen Spatz, NP Follow up on 04/09/2019.   Specialty: Pulmonary Disease Why: at 2:30 pm for  hospital follow up Contact information: [place] [address] [phone]        Candee Furbish, MD Follow up on 04/29/2019.   Specialty: Pulmonary Disease Why: 2:30 for follow up of PET Contact information: [place] [address] [phone]        [name], NP. Schedule an appointment as soon as possible for a visit in 1 week(s).   Specialty: Internal Medicine Why: with repeat CBC/BMP Contact information: [address] [place] [address] [phone]        Evans Lance, MD .   Specialty: Cardiology Contact information: [address] [place] [address] [phone]          Allergies  Allergen Reactions  . Jardiance [Empagliflozin]     Causes heart palpitations  . Trazodone And Nefazodone     "hyped me up, could not sleep"    Consultations:  Cardiology/pulmonary   Procedures/Studies: CT ABDOMEN PELVIS WO CONTRAST  Result Date: 03/24/2019 CLINICAL DATA:  64 year old female with abdominal pain. EXAM: CT ABDOMEN AND PELVIS WITHOUT CONTRAST TECHNIQUE: Multidetector CT imaging of the abdomen and pelvis was performed following the standard protocol without IV contrast. COMPARISON:  Chest CT dated 03/22/2019. FINDINGS: Evaluation of this exam is limited in the absence of intravenous contrast. Lower chest: Partially visualized small bilateral pleural effusions, new since the study of 03/22/2019. Bilateral lower lobe partial consolidative change with air bronchograms may represent atelectasis or pneumonia. Clinical correlation and follow-up recommended. Additional patchy areas of nodular and hazy density noted in the lingula and right middle lobe. There is no intra-abdominal free air or free fluid. Hepatobiliary: The liver is grossly unremarkable. Cholecystectomy. There is mild periportal edema. No retained calcified stone noted in the central CBD. Pancreas: Mildly atrophic  pancreas. No active inflammatory changes or dilatation of the main pancreatic duct. Spleen: Normal in size without focal abnormality. Adrenals/Urinary Tract: The adrenal glands are unremarkable. There is no hydronephrosis or nephrolithiasis on either side. The visualized ureters and urinary bladder appear unremarkable. Stomach/Bowel: There is sigmoid diverticulosis. There is segmental thickening and inflammatory changes of the sigmoid colon which may represent acute diverticulitis versus mild segmental sigmoid colitis. Clinical correlation is recommended. No diverticular abscess or perforation. There is extension of the inflammatory process superiorly along the mesenteric vein with infiltration of the left retroperitoneal fat. If there is clinical concern for  ischemia further evaluation with CT angiography is recommended. There is no bowel obstruction. The appendix is normal. Vascular/Lymphatic: Advanced aortoiliac atherosclerotic disease. The IVC is unremarkable. No portal venous gas. Several small mildly rounded retroperitoneal lymph nodes, likely reactive. No adenopathy. Reproductive: The uterus and ovaries are grossly unremarkable. Other: Mild subcutaneous edema. Musculoskeletal: No acute or significant osseous findings. IMPRESSION: 1. Sigmoid diverticulitis versus possible colitis. Clinical correlation is recommended. No drainable fluid collection abscess. No pneumatosis or portal venous gas. If there is clinical concern for an ischemic process, further evaluation with CT angiography recommended. 2. No bowel obstruction.  Normal appendix. 3. Small bilateral pleural effusions and bilateral atelectasis or infiltrate, new since the prior CT. Clinical correlation and follow-up recommended. 4.  Aortic Atherosclerosis (ICD10-I70.0). Electronically Signed   By: Anner Crete M.D.   On: 03/24/2019 20:26   DG Chest 2 View  Result Date: 03/27/2019 CLINICAL DATA:  Oxygen release EXAM: CHEST - 2 VIEW COMPARISON:   03/25/2019 FINDINGS: Unchanged examination with mild diffuse interstitial pulmonary opacity and small bilateral pleural effusions. IMPRESSION: Unchanged diffuse interstitial pulmonary opacity, likely edema, and small bilateral pleural effusions. Electronically Signed   By: Eddie Candle M.D.   On: 03/27/2019 15:55   DG Chest 2 View  Result Date: 03/25/2019 CLINICAL DATA:  Right-sided chest pain and shortness of breath for 5 days. EXAM: CHEST - 2 VIEW COMPARISON:  03/22/2019 FINDINGS: The cardiac silhouette, mediastinal and hilar contours are within normal limits. Increased interstitial markings and mild peribronchial thickening. There are small bilateral pleural effusions and overlying bibasilar atelectasis. Small pulmonary nodule seen on the recent chest CT are not well demonstrated on the x-ray. IMPRESSION: 1. Bronchitic type changes suggesting bronchitis along with small bilateral pleural effusions and bibasilar atelectasis. 2. No focal pulmonary lesions. Small pulmonary nodule seen on CT scan are not well demonstrated. Electronically Signed   By: Marijo Sanes M.D.   On: 03/25/2019 11:01   CT Angio Chest PE W and/or Wo Contrast  Result Date: 03/22/2019 CLINICAL DATA:  Shortness of breath and right lower lobe pain for 4 days EXAM: CT ANGIOGRAPHY CHEST WITH CONTRAST TECHNIQUE: Multidetector CT imaging of the chest was performed using the standard protocol during bolus administration of intravenous contrast. Multiplanar CT image reconstructions and MIPs were obtained to evaluate the vascular anatomy. CONTRAST:  177mL OMNIPAQUE IOHEXOL 350 MG/ML SOLN COMPARISON:  None. FINDINGS: Cardiovascular: --Pulmonary arteries: Contrast injection is sufficient to demonstrate satisfactory opacification of the pulmonary arteries to the segmental level, with attenuation of at least 200 HU at the main pulmonary artery. There is no pulmonary embolus. The main pulmonary artery is within normal limits for size. --Aorta: Limited  opacification of the aorta due to bolus timing optimization for the pulmonary arteries. The course and caliber of the aorta are normal. Conventional 3 vessel aortic branching pattern. There is mild aortic atherosclerosis. --Heart: Normal size. No pericardial effusion. Mediastinum/Nodes: No mediastinal, hilar or axillary lymphadenopathy. The visualized thyroid and thoracic esophageal course are unremarkable. Lungs/Pleura: Multiple nodular opacities in the right lung, largest of which is in the posterior right upper lobe measures approximately 1.5 cm. There is a 2 cm left parahilar nodule. No pleural effusion. Upper Abdomen: Contrast bolus timing is not optimized for evaluation of the abdominal organs. The visualized portions of the organs of the upper abdomen are normal. Musculoskeletal: No chest wall abnormality. No bony spinal canal stenosis. Review of the MIP images confirms the above findings. IMPRESSION: 1. No pulmonary embolus to the segmental level. 2.  Multiple nodular opacities in both lungs, the largest of which measures 2.0 cm. These are concerning for malignancy. Non-contrast chest CT at 3-6 months is recommended. If the nodules are stable at time of repeat CT, then future CT at 18-24 months (from today's scan) is considered optional for low-risk patients, but is recommended for high-risk patients. This recommendation follows the consensus statement: Guidelines for Management of Incidental Pulmonary Nodules Detected on CT Images: From the Fleischner Society 2017; Radiology 2017; 284:228-243. 3. Aortic Atherosclerosis (ICD10-I70.0). Electronically Signed   By: Ulyses Jarred M.D.   On: 03/22/2019 06:05   DG Chest Portable 1 View  Result Date: 03/22/2019 CLINICAL DATA:  64 year old female with shortness of breath. EXAM: PORTABLE CHEST 1 VIEW COMPARISON:  Chest radiograph dated 06/01/2014 FINDINGS: Minimal bibasilar interstitial nodularity may represent interstitial prominence. Atypical infiltrate is not  excluded. Clinical correlation is recommended. No focal consolidation, pleural effusion, or pneumothorax. The cardiac silhouette is within normal limits. Coronary vascular calcification and atherosclerotic calcification of the aortic arch. No acute osseous pathology. IMPRESSION: 1. No focal consolidation. 2. Minimal bibasilar interstitial nodularity may represent interstitial prominence or atypical infection. Clinical correlation is recommended. Electronically Signed   By: Anner Crete M.D.   On: 03/22/2019 00:49   ECHOCARDIOGRAM COMPLETE  Result Date: 03/23/2019   ECHOCARDIOGRAM REPORT   Patient Name:   MARLEENA SHUBERT Date of Exam: 03/22/2019 Medical Rec #:  [id number]        Height:       64.0 in Accession #:    [id number]       Weight:       155.0 lb Date of Birth:  11/09/1955         BSA:          1.76 m Patient Age:    14 years         BP:           143/86 mmHg Patient Gender: F                HR:           100 bpm. Exam Location:  Inpatient Procedure: 2D Echo and Intracardiac Opacification Agent Indications:    Atrial Flutter 427.32/I48.92  History:        Patient has prior history of Echocardiogram examinations, most                 recent 12/22/2012. Risk Factors:Diabetes and Hypertension.  Sonographer:    Clayton Lefort RDCS (AE) Referring Phys: [id number] RONDELL A SMITH IMPRESSIONS  1. Definity contrast agent was given IV to delineate the left ventricular endocardial borders.  2. Left ventricular ejection fraction, by visual estimation, is 60 to 65%. The left ventricle has normal function. There is no left ventricular hypertrophy.  3. Left ventricular diastolic parameters are consistent with Grade I diastolic dysfunction (impaired relaxation).  4. The left ventricle has no regional wall motion abnormalities.  5. Global right ventricle has normal systolic function.The right ventricular size is normal.  6. Left atrial size was normal.  7. Right atrial size was normal.  8. The mitral valve is normal in  structure. Trivial mitral valve regurgitation. No evidence of mitral stenosis.  9. The tricuspid valve is normal in structure. 10. The aortic valve was not well visualized. Aortic valve regurgitation is not visualized. 11. The pulmonic valve was not well visualized. Pulmonic valve regurgitation is not visualized. 12. Aortic dilatation noted. 13. There is mild dilatation of the aortic root  measuring 40 mm. 14. The inferior vena cava is dilated in size with >50% respiratory variability, suggesting right atrial pressure of 8 mmHg. 15. Technically difficult; definity used; normal LV function; grade 1 diastolic dysfunction; mildly dilated aortic root. FINDINGS  Left Ventricle: Left ventricular ejection fraction, by visual estimation, is 60 to 65%. The left ventricle has normal function. Definity contrast agent was given IV to delineate the left ventricular endocardial borders. The left ventricle has no regional wall motion abnormalities. There is no left ventricular hypertrophy. Left ventricular diastolic parameters are consistent with Grade I diastolic dysfunction (impaired relaxation). Normal left atrial pressure. Right Ventricle: The right ventricular size is normal. Global RV systolic function is has normal systolic function. Left Atrium: Left atrial size was normal in size. Right Atrium: Right atrial size was normal in size Pericardium: There is no evidence of pericardial effusion. Mitral Valve: The mitral valve is normal in structure. Trivial mitral valve regurgitation. No evidence of mitral valve stenosis by observation. Tricuspid Valve: The tricuspid valve is normal in structure. Tricuspid valve regurgitation is trivial. Aortic Valve: The aortic valve was not well visualized. Aortic valve regurgitation is not visualized. Pulmonic Valve: The pulmonic valve was not well visualized. Pulmonic valve regurgitation is not visualized. Pulmonic regurgitation is not visualized. Aorta: Aortic dilatation noted. There is mild  dilatation of the aortic root measuring 40 mm. Venous: The inferior vena cava is dilated in size with greater than 50% respiratory variability, suggesting right atrial pressure of 8 mmHg.  Additional Comments: Technically difficult; definity used; normal LV function; grade 1 diastolic dysfunction; mildly dilated aortic root.  LEFT VENTRICLE PLAX 2D LVIDd:         4.17 cm LVIDs:         2.95 cm LV PW:         0.98 cm LV IVS:        1.26 cm LVOT diam:     2.00 cm LV SV:         44 ml LV SV Index:   24.29 LVOT Area:     3.14 cm  RIGHT VENTRICLE             IVC RV S prime:     14.40 cm/s  IVC diam: 2.33 cm TAPSE (M-mode): 2.2 cm LEFT ATRIUM             Index       RIGHT ATRIUM           Index LA diam:        2.50 cm 1.42 cm/m  RA Area:     12.70 cm LA Vol (A2C):   40.6 ml 23.13 ml/m RA Volume:   32.90 ml  18.74 ml/m LA Vol (A4C):   15.0 ml 8.54 ml/m LA Biplane Vol: 27.0 ml 15.38 ml/m  AORTIC VALVE LVOT Vmax:   106.44 cm/s LVOT Vmean:  74.600 cm/s LVOT VTI:    0.196 m  AORTA Ao Root diam: 3.40 cm Ao Asc diam:  4.00 cm  SHUNTS Systemic VTI:  0.20 m Systemic Diam: 2.00 cm  Kirk Ruths MD Electronically signed by Kirk Ruths MD Signature Date/Time: 03/23/2019/7:16:15 AM    Final    US Abdomen Limited RUQ  Result Date: 03/23/2019 CLINICAL DATA:  Right upper quadrant pain EXAM: ULTRASOUND ABDOMEN LIMITED RIGHT UPPER QUADRANT COMPARISON:  None. FINDINGS: Gallbladder: Cholecystectomy. Common bile duct: Diameter: 7.7 mm. Liver: No focal lesion identified. Mildly increased parenchymal echogenicity. There is intrahepatic biliary dilatation. Portal vein is patent  on color Doppler imaging with normal direction of blood flow towards the liver. Other: None. IMPRESSION: Mildly increased liver echogenicity likely reflecting steatosis. Mild intrahepatic and extrahepatic biliary dilatation probably related to post cholecystectomy state. Electronically Signed   By: Macy Mis M.D.   On: 03/23/2019 14:42        Subjective: Patient seen and examined at bedside.  She feels better and wants to go home.  No overnight fever, nausea or vomiting.  No worsening abdominal pain.  Discharge Exam: Vitals:   03/28/19 0430 03/28/19 0748  BP: 117/78 123/75  Pulse: 73 77  Resp: 17 16  Temp: 98.9 F (37.2 C) 98.2 F (36.8 C)  SpO2: 91% 91%    General: Pt is alert, awake, not in acute distress.  Looks chronically ill. Cardiovascular: rate controlled, S1/S2 + Respiratory: bilateral decreased breath sounds at bases with some scattered crackles Abdominal: Soft, NT, ND, bowel sounds + Extremities: Trace lower extremity edema, no cyanosis    The results of significant diagnostics from this hospitalization (including imaging, microbiology, ancillary and laboratory) are listed below for reference.     Microbiology: Recent Results (from the past 240 hour(s))  Respiratory Panel by RT PCR (Flu A&B, Covid) - Nasopharyngeal Swab     Status: None   Collection Time: 03/22/19  4:24 AM   Specimen: Nasopharyngeal Swab  Result Value Ref Range Status   SARS Coronavirus 2 by RT PCR NEGATIVE NEGATIVE Final    Comment: (NOTE) SARS-CoV-2 target nucleic acids are NOT DETECTED. The SARS-CoV-2 RNA is generally detectable in upper respiratoy specimens during the acute phase of infection. The lowest concentration of SARS-CoV-2 viral copies this assay can detect is 131 copies/mL. A negative result does not preclude SARS-Cov-2 infection and should not be used as the sole basis for treatment or other patient management decisions. A negative result may occur with  improper specimen collection/handling, submission of specimen other than nasopharyngeal swab, presence of viral mutation(s) within the areas targeted by this assay, and inadequate number of viral copies (<131 copies/mL). A negative result must be combined with clinical observations, patient history, and epidemiological information. The expected result is  Negative. Fact Sheet for Patients:  PinkCheek.be Fact Sheet for Healthcare Providers:  GravelBags.it This test is not yet ap proved or cleared by the [place] FDA and  has been authorized for detection and/or diagnosis of SARS-CoV-2 by FDA under an Emergency Use Authorization (EUA). This EUA will remain  in effect (meaning this test can be used) for the duration of the COVID-19 declaration under Section 564(b)(1) of the Act, 21 U.S.C. section 360bbb-3(b)(1), unless the authorization is terminated or revoked sooner.    Influenza A by PCR NEGATIVE NEGATIVE Final   Influenza B by PCR NEGATIVE NEGATIVE Final    Comment: (NOTE) The Xpert Xpress SARS-CoV-2/FLU/RSV assay is intended as an aid in  the diagnosis of influenza from Nasopharyngeal swab specimens and  should not be used as a sole basis for treatment. Nasal washings and  aspirates are unacceptable for Xpert Xpress SARS-CoV-2/FLU/RSV  testing. Fact Sheet for Patients: PinkCheek.be Fact Sheet for Healthcare Providers: GravelBags.it This test is not yet approved or cleared by the [place] FDA and  has been authorized for detection and/or diagnosis of SARS-CoV-2 by  FDA under an Emergency Use Authorization (EUA). This EUA will remain  in effect (meaning this test can be used) for the duration of the  Covid-19 declaration under Section 564(b)(1) of the Act, 21  U.S.C. section 360bbb-3(b)(1), unless  the authorization is  terminated or revoked. Performed at [facility], [place] [address] [place], [place] [address]   Respiratory Panel by PCR     Status: None   Collection Time: 03/22/19  8:53 AM   Specimen: Nasopharyngeal Swab; Respiratory  Result Value Ref Range Status   Adenovirus NOT DETECTED NOT DETECTED Final   Coronavirus 229E NOT DETECTED NOT DETECTED Final    Comment: (NOTE) The Coronavirus on  the Respiratory Panel, DOES NOT test for the novel  Coronavirus (2019 nCoV)    Coronavirus HKU1 NOT DETECTED NOT DETECTED Final   Coronavirus NL63 NOT DETECTED NOT DETECTED Final   Coronavirus OC43 NOT DETECTED NOT DETECTED Final   Metapneumovirus NOT DETECTED NOT DETECTED Final   Rhinovirus / Enterovirus NOT DETECTED NOT DETECTED Final   Influenza A NOT DETECTED NOT DETECTED Final   Influenza B NOT DETECTED NOT DETECTED Final   Parainfluenza Virus 1 NOT DETECTED NOT DETECTED Final   Parainfluenza Virus 2 NOT DETECTED NOT DETECTED Final   Parainfluenza Virus 3 NOT DETECTED NOT DETECTED Final   Parainfluenza Virus 4 NOT DETECTED NOT DETECTED Final   Respiratory Syncytial Virus NOT DETECTED NOT DETECTED Final   Bordetella pertussis NOT DETECTED NOT DETECTED Final   Chlamydophila pneumoniae NOT DETECTED NOT DETECTED Final   Mycoplasma pneumoniae NOT DETECTED NOT DETECTED Final    Comment: Performed at [facility], [place]. [address] [place], [place] [address]  Culture, blood (x 2)     Status: Abnormal   Collection Time: 03/22/19 10:44 AM   Specimen: BLOOD LEFT HAND  Result Value Ref Range Status   Specimen Description BLOOD LEFT HAND  Final   Special Requests   Final    BOTTLES DRAWN AEROBIC AND ANAEROBIC Blood Culture adequate volume   Culture  Setup Time   Final    IN BOTH AEROBIC AND ANAEROBIC BOTTLES GRAM NEGATIVE RODS CRITICAL VALUE NOTED.  VALUE IS CONSISTENT WITH PREVIOUSLY REPORTED AND CALLED VALUE.    Culture (A)  Final    ESCHERICHIA COLI SUSCEPTIBILITIES PERFORMED ON PREVIOUS CULTURE WITHIN THE LAST 5 DAYS. Performed at [facility], [place] [address], [place], [place] [address]    Report Status 03/25/2019 FINAL  Final  Culture, blood (x 2)     Status: Abnormal   Collection Time: 03/22/19 10:44 AM   Specimen: BLOOD RIGHT HAND  Result Value Ref Range Status   Specimen Description BLOOD RIGHT HAND  Final   Special Requests   Final    BOTTLES DRAWN AEROBIC ONLY  Blood Culture adequate volume   Culture  Setup Time   Final    AEROBIC BOTTLE ONLY GRAM NEGATIVE RODS CRITICAL RESULT CALLED TO, READ BACK BY AND VERIFIED WITH: CRYSTAL ROBERTSON PHARM @ 0205 ON 03/23/19 BY ROBINSON Z.  Performed at [facility], [place] [address] [place], [place] [address]    Culture ESCHERICHIA COLI (A)  Final   Report Status 03/25/2019 FINAL  Final   Organism ID, Bacteria ESCHERICHIA COLI  Final      Susceptibility   Escherichia coli - MIC*    AMPICILLIN <=2 SENSITIVE Sensitive     CEFAZOLIN <=4 SENSITIVE Sensitive     CEFEPIME <=0.12 SENSITIVE Sensitive     CEFTAZIDIME <=1 SENSITIVE Sensitive     CEFTRIAXONE <=0.25 SENSITIVE Sensitive     CIPROFLOXACIN <=0.25 SENSITIVE Sensitive     GENTAMICIN <=1 SENSITIVE Sensitive     IMIPENEM <=0.25 SENSITIVE Sensitive  TRIMETH/SULFA <=20 SENSITIVE Sensitive     AMPICILLIN/SULBACTAM <=2 SENSITIVE Sensitive     PIP/TAZO <=4 SENSITIVE Sensitive     * ESCHERICHIA COLI  Blood Culture ID Panel (Reflexed)     Status: Abnormal   Collection Time: 03/22/19 10:44 AM  Result Value Ref Range Status   Enterococcus species NOT DETECTED NOT DETECTED Final   Listeria monocytogenes NOT DETECTED NOT DETECTED Final   Staphylococcus species NOT DETECTED NOT DETECTED Final   Staphylococcus aureus (BCID) NOT DETECTED NOT DETECTED Final   Streptococcus species NOT DETECTED NOT DETECTED Final   Streptococcus agalactiae NOT DETECTED NOT DETECTED Final   Streptococcus pneumoniae NOT DETECTED NOT DETECTED Final   Streptococcus pyogenes NOT DETECTED NOT DETECTED Final   Acinetobacter baumannii NOT DETECTED NOT DETECTED Final   Enterobacteriaceae species DETECTED (A) NOT DETECTED Final    Comment: Enterobacteriaceae represent a large family of gram-negative bacteria, not a single organism. CRITICAL RESULT CALLED TO, READ BACK BY AND VERIFIED WITH: CRYSTAL ROBERTSON PHARM @ 0205 ON 03/23/19 BY ROBINSON Z.     Enterobacter cloacae complex NOT  DETECTED NOT DETECTED Final   Escherichia coli DETECTED (A) NOT DETECTED Final    Comment: CRITICAL RESULT CALLED TO, READ BACK BY AND VERIFIED WITH: CRYSTAL ROBERTSON PHARM @ 0205 ON 03/23/19 BY ROBINSON Z.     Klebsiella oxytoca NOT DETECTED NOT DETECTED Final   Klebsiella pneumoniae NOT DETECTED NOT DETECTED Final   Proteus species NOT DETECTED NOT DETECTED Final   Serratia marcescens NOT DETECTED NOT DETECTED Final   Carbapenem resistance NOT DETECTED NOT DETECTED Final   Haemophilus influenzae NOT DETECTED NOT DETECTED Final   Neisseria meningitidis NOT DETECTED NOT DETECTED Final   Pseudomonas aeruginosa NOT DETECTED NOT DETECTED Final   Candida albicans NOT DETECTED NOT DETECTED Final   Candida glabrata NOT DETECTED NOT DETECTED Final   Candida krusei NOT DETECTED NOT DETECTED Final   Candida parapsilosis NOT DETECTED NOT DETECTED Final   Candida tropicalis NOT DETECTED NOT DETECTED Final    Comment: Performed at [facility], [place]. [address] [place], [place] [address]  C difficile quick scan w PCR reflex     Status: None   Collection Time: 03/23/19  5:37 PM   Specimen: STOOL  Result Value Ref Range Status   C Diff antigen NEGATIVE NEGATIVE Final   C Diff toxin NEGATIVE NEGATIVE Final   C Diff interpretation No C. difficile detected.  Final    Comment: Performed at [facility], [place] [address], [place], [place] [address]     Labs: BNP (last 3 results) No results for input(s): BNP in the last 8760 hours. Basic Metabolic Panel: Recent Labs  Lab 03/22/19 1008 03/22/19 1640 03/23/19 0004 03/23/19 0501 03/24/19 1259 03/24/19 1259 03/25/19 0605 03/26/19 0430 03/27/19 0347 03/27/19 1436 03/28/19 0400  NA 132*   < > 131*   < > 133*  --  137 142 143  --  139  K 3.0*   < > 3.4*   < > 4.2   < > 3.0* 3.6 2.6* 3.8 3.7  CL 102   < > 102   < > 102  --  100 104 96*  --  97*  CO2 16*   < > 17*   < > 20*  --  26 29 34*  --  31  GLUCOSE 211*   < > 234*   < > 200*  --   120* 99 106*  --  102*  BUN 11   < > 16   < > 19  --  7* 5* <5*  --  6*  CREATININE 0.85   < > 0.72   < > 0.65  --  0.48 0.53 0.52  --  0.54  CALCIUM 7.9*   < > 8.1*   < > 8.2*  --  8.0* 8.2* 8.4*  --  8.5*  MG 1.4*  --  1.6*  --   --   --   --   --  1.5*  --   --    < > = values in this interval not displayed.   Liver Function Tests: Recent Labs  Lab 03/22/19 0003  AST 26  ALT 31  ALKPHOS 185*  BILITOT 1.8*  PROT 6.5  ALBUMIN 3.1*   Recent Labs  Lab 03/22/19 0424  LIPASE 18   No results for input(s): AMMONIA in the last 168 hours. CBC: Recent Labs  Lab 03/22/19 0003 03/22/19 0638 03/23/19 0004 03/25/19 0947  WBC 13.8*  --  11.0* 10.8*  NEUTROABS 11.6*  --   --   --   HGB 16.8* 16.0* 14.7 13.7  HCT 48.1* 47.0* 43.0 40.2  MCV 89.1  --  89.8 90.1  PLT 158  --  130* 162   Cardiac Enzymes: No results for input(s): CKTOTAL, CKMB, CKMBINDEX, TROPONINI in the last 168 hours. BNP: Invalid input(s): POCBNP CBG: Recent Labs  Lab 03/27/19 0525 03/27/19 1115 03/27/19 1621 03/27/19 2116 03/28/19 0542  GLUCAP 100* 117* 180* 112* 96   D-Dimer No results for input(s): DDIMER in the last 72 hours. Hgb A1c No results for input(s): HGBA1C in the last 72 hours. Lipid Profile No results for input(s): CHOL, HDL, LDLCALC, TRIG, CHOLHDL, LDLDIRECT in the last 72 hours. Thyroid function studies No results for input(s): TSH, T4TOTAL, T3FREE, THYROIDAB in the last 72 hours.  Invalid input(s): FREET3 Anemia work up No results for input(s): VITAMINB12, FOLATE, FERRITIN, TIBC, IRON, RETICCTPCT in the last 72 hours. Urinalysis    Component Value Date/Time   COLORURINE STRAW (A) 03/22/2019 0855   APPEARANCEUR CLEAR 03/22/2019 0855   LABSPEC 1.018 03/22/2019 0855   PHURINE 5.0 03/22/2019 0855   GLUCOSEU >=500 (A) 03/22/2019 0855   HGBUR SMALL (A) 03/22/2019 0855   BILIRUBINUR NEGATIVE 03/22/2019 0855   BILIRUBINUR neg 09/06/2017 1138   KETONESUR 80 (A) 03/22/2019 0855    PROTEINUR NEGATIVE 03/22/2019 0855   UROBILINOGEN 0.2 09/06/2017 1138   NITRITE NEGATIVE 03/22/2019 0855   LEUKOCYTESUR NEGATIVE 03/22/2019 0855   Sepsis Labs Invalid input(s): PROCALCITONIN,  WBC,  LACTICIDVEN Microbiology Recent Results (from the past 240 hour(s))  Respiratory Panel by RT PCR (Flu A&B, Covid) - Nasopharyngeal Swab     Status: None   Collection Time: 03/22/19  4:24 AM   Specimen: Nasopharyngeal Swab  Result Value Ref Range Status   SARS Coronavirus 2 by RT PCR NEGATIVE NEGATIVE Final    Comment: (NOTE) SARS-CoV-2 target nucleic acids are NOT DETECTED. The SARS-CoV-2 RNA is generally detectable in upper respiratoy specimens during the acute phase of infection. The lowest concentration of SARS-CoV-2 viral copies this assay can detect is 131 copies/mL. A negative result does not preclude SARS-Cov-2 infection and should not be used as the sole basis for treatment or other patient management decisions. A negative result may occur with  improper specimen collection/handling, submission of specimen other than nasopharyngeal swab, presence of viral mutation(s) within the areas targeted by this assay, and inadequate number of viral  copies (<131 copies/mL). A negative result must be combined with clinical observations, patient history, and epidemiological information. The expected result is Negative. Fact Sheet for Patients:  PinkCheek.be Fact Sheet for Healthcare Providers:  GravelBags.it This test is not yet ap proved or cleared by the [place] FDA and  has been authorized for detection and/or diagnosis of SARS-CoV-2 by FDA under an Emergency Use Authorization (EUA). This EUA will remain  in effect (meaning this test can be used) for the duration of the COVID-19 declaration under Section 564(b)(1) of the Act, 21 U.S.C. section 360bbb-3(b)(1), unless the authorization is terminated or revoked sooner.     Influenza A by PCR NEGATIVE NEGATIVE Final   Influenza B by PCR NEGATIVE NEGATIVE Final    Comment: (NOTE) The Xpert Xpress SARS-CoV-2/FLU/RSV assay is intended as an aid in  the diagnosis of influenza from Nasopharyngeal swab specimens and  should not be used as a sole basis for treatment. Nasal washings and  aspirates are unacceptable for Xpert Xpress SARS-CoV-2/FLU/RSV  testing. Fact Sheet for Patients: PinkCheek.be Fact Sheet for Healthcare Providers: GravelBags.it This test is not yet approved or cleared by the [place] FDA and  has been authorized for detection and/or diagnosis of SARS-CoV-2 by  FDA under an Emergency Use Authorization (EUA). This EUA will remain  in effect (meaning this test can be used) for the duration of the  Covid-19 declaration under Section 564(b)(1) of the Act, 21  U.S.C. section 360bbb-3(b)(1), unless the authorization is  terminated or revoked. Performed at [facility], [place] [address] [place], [place] [address]   Respiratory Panel by PCR     Status: None   Collection Time: 03/22/19  8:53 AM   Specimen: Nasopharyngeal Swab; Respiratory  Result Value Ref Range Status   Adenovirus NOT DETECTED NOT DETECTED Final   Coronavirus 229E NOT DETECTED NOT DETECTED Final    Comment: (NOTE) The Coronavirus on the Respiratory Panel, DOES NOT test for the novel  Coronavirus (2019 nCoV)    Coronavirus HKU1 NOT DETECTED NOT DETECTED Final   Coronavirus NL63 NOT DETECTED NOT DETECTED Final   Coronavirus OC43 NOT DETECTED NOT DETECTED Final   Metapneumovirus NOT DETECTED NOT DETECTED Final   Rhinovirus / Enterovirus NOT DETECTED NOT DETECTED Final   Influenza A NOT DETECTED NOT DETECTED Final   Influenza B NOT DETECTED NOT DETECTED Final   Parainfluenza Virus 1 NOT DETECTED NOT DETECTED Final   Parainfluenza Virus 2 NOT DETECTED NOT DETECTED Final   Parainfluenza Virus 3 NOT DETECTED NOT  DETECTED Final   Parainfluenza Virus 4 NOT DETECTED NOT DETECTED Final   Respiratory Syncytial Virus NOT DETECTED NOT DETECTED Final   Bordetella pertussis NOT DETECTED NOT DETECTED Final   Chlamydophila pneumoniae NOT DETECTED NOT DETECTED Final   Mycoplasma pneumoniae NOT DETECTED NOT DETECTED Final    Comment: Performed at [facility], [place]. [address], [place], [place] [address]  Culture, blood (x 2)     Status: Abnormal   Collection Time: 03/22/19 10:44 AM   Specimen: BLOOD LEFT HAND  Result Value Ref Range Status   Specimen Description BLOOD LEFT HAND  Final   Special Requests   Final    BOTTLES DRAWN AEROBIC AND ANAEROBIC Blood Culture adequate volume   Culture  Setup Time   Final    IN BOTH AEROBIC AND ANAEROBIC BOTTLES GRAM NEGATIVE RODS CRITICAL VALUE NOTED.  VALUE IS CONSISTENT WITH PREVIOUSLY REPORTED AND CALLED VALUE.    Culture (A)  Final    ESCHERICHIA COLI SUSCEPTIBILITIES PERFORMED ON PREVIOUS CULTURE WITHIN THE LAST 5 DAYS. Performed at [facility], [place] [address] [place], [place] [address]    Report Status 03/25/2019 FINAL  Final  Culture, blood (x 2)     Status: Abnormal   Collection Time: 03/22/19 10:44 AM   Specimen: BLOOD RIGHT HAND  Result Value Ref Range Status   Specimen Description BLOOD RIGHT HAND  Final   Special Requests   Final    BOTTLES DRAWN AEROBIC ONLY Blood Culture adequate volume   Culture  Setup Time   Final    AEROBIC BOTTLE ONLY GRAM NEGATIVE RODS CRITICAL RESULT CALLED TO, READ BACK BY AND VERIFIED WITH: CRYSTAL ROBERTSON [facility]ARM @ 0205 ON 03/23/19 BY ROBINSON Z.  Performed at [facility], [place] [address], [place], [place] [address]    Culture ESCHERICHIA COLI (A)  Final   Report Status 03/25/2019 FINAL  Final   Organism ID, Bacteria ESCHERICHIA COLI  Final      Susceptibility   Escherichia coli - MIC*    AMPICILLIN <=2 SENSITIVE Sensitive     CEFAZOLIN <=4 SENSITIVE Sensitive     CEFEPIME <=0.12 SENSITIVE  Sensitive     CEFTAZIDIME <=1 SENSITIVE Sensitive     CEFTRIAXONE <=0.25 SENSITIVE Sensitive     CIPROFLOXACIN <=0.25 SENSITIVE Sensitive     GENTAMICIN <=1 SENSITIVE Sensitive     IMIPENEM <=0.25 SENSITIVE Sensitive     TRIMETH/SULFA <=20 SENSITIVE Sensitive     AMPICILLIN/SULBACTAM <=2 SENSITIVE Sensitive     PIP/TAZO <=4 SENSITIVE Sensitive     * ESCHERICHIA COLI  Blood Culture ID Panel (Reflexed)     Status: Abnormal   Collection Time: 03/22/19 10:44 AM  Result Value Ref Range Status   Enterococcus species NOT DETECTED NOT DETECTED Final   Listeria monocytogenes NOT DETECTED NOT DETECTED Final   Staphylococcus species NOT DETECTED NOT DETECTED Final   Staphylococcus aureus (BCID) NOT DETECTED NOT DETECTED Final   Streptococcus species NOT DETECTED NOT DETECTED Final   Streptococcus agalactiae NOT DETECTED NOT DETECTED Final   Streptococcus pneumoniae NOT DETECTED NOT DETECTED Final   Streptococcus pyogenes NOT DETECTED NOT DETECTED Final   Acinetobacter baumannii NOT DETECTED NOT DETECTED Final   Enterobacteriaceae species DETECTED (A) NOT DETECTED Final    Comment: Enterobacteriaceae represent a large family of gram-negative bacteria, not a single organism. CRITICAL RESULT CALLED TO, READ BACK BY AND VERIFIED WITH: CRYSTAL ROBERTSON [facility]ARM @ 0205 ON 03/23/19 BY ROBINSON Z.     Enterobacter cloacae complex NOT DETECTED NOT DETECTED Final   Escherichia coli DETECTED (A) NOT DETECTED Final    Comment: CRITICAL RESULT CALLED TO, READ BACK BY AND VERIFIED WITH: CRYSTAL ROBERTSON [facility]ARM @ 0205 ON 03/23/19 BY ROBINSON Z.     Klebsiella oxytoca NOT DETECTED NOT DETECTED Final   Klebsiella pneumoniae NOT DETECTED NOT DETECTED Final   Proteus species NOT DETECTED NOT DETECTED Final   Serratia marcescens NOT DETECTED NOT DETECTED Final   Carbapenem resistance NOT DETECTED NOT DETECTED Final   Haemophilus influenzae NOT DETECTED NOT DETECTED Final   Neisseria meningitidis NOT DETECTED NOT  DETECTED Final   Pseudomonas aeruginosa NOT DETECTED NOT DETECTED Final   Candida albicans NOT DETECTED NOT DETECTED Final   Candida glabrata NOT DETECTED NOT DETECTED Final   Candida krusei NOT DETECTED NOT DETECTED Final   Candida parapsilosis NOT DETECTED NOT DETECTED Final   Candida tropicalis NOT DETECTED NOT DETECTED Final  Comment: Performed at [facility], [place] [address], [place], [place] [address]  C difficile quick scan w PCR reflex     Status: None   Collection Time: 03/23/19  5:37 PM   Specimen: STOOL  Result Value Ref Range Status   C Diff antigen NEGATIVE NEGATIVE Final   C Diff toxin NEGATIVE NEGATIVE Final   C Diff interpretation No C. difficile detected.  Final    Comment: Performed at [facility], [place] [address] [place], [place] [address]     Time coordinating discharge: 35 minutes  SIGNED:   Aline August, MD  [facility] 03/28/2019, 11:23 AM

## 2019-03-28 NOTE — Plan of Care (Signed)
  Problem: Activity: Goal: Risk for activity intolerance will decrease Outcome: Completed/Met   Problem: Nutrition: Goal: Adequate nutrition will be maintained Outcome: Completed/Met   Problem: Elimination: Goal: Will not experience complications related to bowel motility Outcome: Completed/Met Goal: Will not experience complications related to urinary retention Outcome: Completed/Met   

## 2019-03-31 ENCOUNTER — Other Ambulatory Visit: Payer: Self-pay

## 2019-03-31 ENCOUNTER — Telehealth: Payer: Self-pay | Admitting: *Deleted

## 2019-03-31 NOTE — Telephone Encounter (Signed)
Transition Care Management Follow-up Telephone Call   Date discharged? 03/28/19   How have you been since you were released from the hospital? " okay just a little weak"   Do you understand why you were in the hospital? yes   Do you understand the discharge instructions? yes   Where were you discharged to? Home w/ husband   Items Reviewed:  Medications reviewed: yes  Allergies reviewed: yes  Dietary changes reviewed: yes, low salt and low sugar  Referrals reviewed: yes   Functional Questionnaire:   Activities of Daily Living (ADLs):   She states they are independent in the following: ambulation, bathing and hygiene, feeding, continence, grooming, toileting and dressing States they require assistance with the following: na   Any transportation issues/concerns?: no   Any patient concerns? Yes, would like more specifics on what she needs to be eating.    Confirmed importance and date/time of follow-up visits scheduled yes  Provider Appointment booked with PCP 04/01/19  Confirmed with patient if condition begins to worsen call PCP or go to the ER.  Patient was given the office number and encouraged to call back with question or concerns.  : yes

## 2019-04-01 ENCOUNTER — Encounter: Payer: Self-pay | Admitting: Family

## 2019-04-01 ENCOUNTER — Ambulatory Visit: Payer: 59 | Admitting: Family

## 2019-04-01 VITALS — BP 93/54 | HR 78 | Temp 96.8°F | Resp 16 | Ht 64.0 in | Wt 139.0 lb

## 2019-04-01 DIAGNOSIS — E119 Type 2 diabetes mellitus without complications: Secondary | ICD-10-CM

## 2019-04-01 DIAGNOSIS — D72829 Elevated white blood cell count, unspecified: Secondary | ICD-10-CM | POA: Diagnosis not present

## 2019-04-01 DIAGNOSIS — E1165 Type 2 diabetes mellitus with hyperglycemia: Secondary | ICD-10-CM

## 2019-04-01 DIAGNOSIS — R918 Other nonspecific abnormal finding of lung field: Secondary | ICD-10-CM

## 2019-04-01 DIAGNOSIS — E0590 Thyrotoxicosis, unspecified without thyrotoxic crisis or storm: Secondary | ICD-10-CM

## 2019-04-01 DIAGNOSIS — I10 Essential (primary) hypertension: Secondary | ICD-10-CM

## 2019-04-01 MED ORDER — BD PEN NEEDLE MINI U/F 31G X 5 MM MISC: 3 refills | Status: DC

## 2019-04-01 MED ORDER — INSULIN GLARGINE 100 UNITS/ML SOLOSTAR PEN: 10.0000 [IU] | PEN_INJECTOR | Freq: Every day | SUBCUTANEOUS | 2 refills | Status: DC

## 2019-04-01 NOTE — Progress Notes (Signed)
Subjective:    [name], female    DOB: 23-Jun-1955, 64 y.o.   MRN: [id number]  HPI   Patient is a 64 yr old female who presents today for hospital follow up.  Hospital record is reviewed.  She was admitted on March 21, 2019 with right sided chest pain.  On imaging she was found to have possible small bilateral pleural effusions and bilateral infiltrates.  She also underwent a CT of the abdomen and pelvis which showed acute sigmoid diverticulitis.  She was found to have E. coli bacteremia.  She was treated with IV Rocephin.  During her hospitalization she was noted to be hypoxic and required supplemental oxygen.  She was discharged home following pulmonology evaluation with recommendation to continue antibiotics for 3 more weeks.  She was discharged home on oral Keflex and Flagyl.  Note was made of pulmonary nodules on imaging during her hospitalization which raised concern for infection versus bronchogenic cancer.  Diabetes type 2-her A1c was noted to be very uncontrolled during her admission at 13.9. Reports that her sugar was 189 this AM.  Last night 220, 212 yesterday AM. She has not taken actos. She is taking metformin and glipizide.    Review of Systems See HPI . Past Medical History:  Diagnosis Date  . Atrial flutter [facility])    s/p CTI by Dr Lovena Le 02/2013  . Chest pain   . Diabetes mellitus without complication ([place])   . 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 Hemochromatosis associated with compound heterozygous mutation in HFE gene ([place]) 12/10/2017  . Hypertension   . Hyperthyroidism 10/31/2014     Social History   Socioeconomic History  . Marital status: Married    Spouse name: Not on file  . Number of children: Not on file  . Years of education: Not on file  . Highest education level: Not on file  Occupational History  . Not on file  Tobacco Use  . Smoking status: Former Smoker    Packs/day: 1.00    Types: Cigarettes  .  Smokeless tobacco: Never Used  Substance and Sexual Activity  . Alcohol use: No    Alcohol/week: 0.0 standard drinks  . Drug use: No  . Sexual activity: Not on file  Other Topics Concern  . Not on file  Social History Narrative   Married   Clinical cytogeneticist- full time   Daughter- 2 grandchildren, live in [place]   Enjoys playing on computer.     Social Determinants of Health   Financial Resource Strain:   . Difficulty of Paying Living Expenses: Not on file  Food Insecurity:   . Worried About Charity fundraiser in the Last Year: Not on file  . Ran Out of Food in the Last Year: Not on file  Transportation Needs:   . Lack of Transportation (Medical): Not on file  . Lack of Transportation (Non-Medical): Not on file  Physical Activity:   . Days of Exercise per Week: Not on file  . Minutes of Exercise per Session: Not on file  Stress:   . Feeling of Stress : Not on file  Social Connections:   . Frequency of Communication with Friends and Family: Not on file  . Frequency of Social Gatherings with Friends and Family: Not on file  . Attends Religious Services: Not on file  . Active Member of Clubs or Organizations: Not on file  . Attends Archivist Meetings: Not on  file  . Marital Status: Not on file  Intimate Partner Violence:   . Fear of Current or Ex-Partner: Not on file  . Emotionally Abused: Not on file  . Physically Abused: Not on file  . Sexually Abused: Not on file    Past Surgical History:  Procedure Laterality Date  . ABLATION  03-04-2013   CTI by Dr Lovena Le for atrial flutter  . ATRIAL FLUTTER ABLATION N/A 03/04/2013   Procedure: ATRIAL FLUTTER ABLATION;  Surgeon: Evans Lance, MD;  Location: [facility] CATH LAB;  Service: Cardiovascular;  Laterality: N/A;  . CHOLECYSTECTOMY  1982  . ESOPHAGOGASTRODUODENOSCOPY N/A 05/20/2014   Procedure: ESOPHAGOGASTRODUODENOSCOPY (EGD);  Surgeon: Teena Irani, MD;  Location: [facility];  Service: Endoscopy;  Laterality: N/A;  .  TONSILLECTOMY  1972 ?    Family History  Problem Relation Age of Onset  . Atrial fibrillation Mother   . Arrhythmia Mother   . Diabetes Mother   . Hodgkin's lymphoma Father     Allergies  Allergen Reactions  . Jardiance [Empagliflozin]     Causes heart palpitations  . Trazodone And Nefazodone     "hyped me up, could not sleep"    Current Outpatient Medications on File Prior to Visit  Medication Sig Dispense Refill  . albuterol (VENTOLIN HFA) 108 (90 Base) MCG/ACT inhaler Inhale 2 puffs into the lungs every 6 (six) hours as needed for wheezing or shortness of breath. 6.7 g 0  . Bioflavonoid Products (ESTER C PO) Take 1 tablet by mouth daily.    . Calcium Carb-Cholecalciferol (CALTRATE 600+D3 PO) Take 1 tablet by mouth daily.    . carvedilol (COREG) 12.5 MG tablet TAKE 1 TABLET BY MOUTH TWICE DAILY ( PATIENT MUST KEEP UPCOMING APPOINTMENT ) (Patient taking differently: Take 12.5 mg by mouth 2 (two) times daily with a meal. TAKE 1 TABLET BY MOUTH TWICE DAILY ( PATIENT MUST KEEP UPCOMING APPOINTMENT )) 180 tablet 3  . cephALEXin (KEFLEX) 500 MG capsule Take 1 capsule (500 mg total) by mouth 3 (three) times daily for 14 days. 42 capsule 0  . flecainide (TAMBOCOR) 50 MG tablet TAKE 1 TABLET BY MOUTH TWICE DAILY, MAY TAKE AN ADDITIONAL 1 TABLET AS NEEDED. Please keep upcoming appt in January. Thank you (Patient taking differently: Take 50 mg by mouth 2 (two) times daily. TAKE 1 TABLET BY MOUTH TWICE DAILY, MAY TAKE AN ADDITIONAL 1 TABLET AS NEEDED. Please keep upcoming appt in January. Thank you) 270 tablet 0  . furosemide (LASIX) 20 MG tablet Take 2 tablets (40 mg total) by mouth daily. 60 tablet 0  . glimepiride (AMARYL) 4 MG tablet Take 1 tablet (4 mg total) by mouth daily with breakfast. TAKE 1 TABLET BY MOUTH ONCE DAILY BEFORE BREAKFAST    . guaiFENesin-dextromethorphan (ROBITUSSIN DM) 100-10 MG/5ML syrup Take 5 mLs by mouth every 4 (four) hours as needed for cough. 118 mL 0  . Lancets  30G MISC Check sugar twice daily. 100 each 3  . lisinopril-hydrochlorothiazide (PRINZIDE,ZESTORETIC) 20-25 MG tablet Take 1 tablet by mouth daily. 90 tablet 1  . metFORMIN (GLUCOPHAGE) 500 MG tablet Take 2 tablets (1,000 mg total) by mouth 2 (two) times daily. 120 tablet 1  . methimazole (TAPAZOLE) 10 MG tablet Take 1.5 tablets (15 mg total) by mouth daily. 45 tablet 2  . metroNIDAZOLE (FLAGYL) 500 MG tablet Take 1 tablet (500 mg total) by mouth 3 (three) times daily for 5 days. 15 tablet 0  . Omega-3 Fatty Acids (FISH OIL) 1200 MG  CAPS Take 1,200 mg by mouth daily.     Marland Kitchen oxyCODONE (OXY IR/ROXICODONE) 5 MG immediate release tablet Take 1 tablet (5 mg total) by mouth every 6 (six) hours as needed for moderate pain or breakthrough pain. 14 tablet 0  . potassium chloride SA (KLOR-CON) 20 MEQ tablet TAKE 1  BY MOUTH ONCE DAILY (Patient taking differently: Take 20 mEq by mouth daily. TAKE 1  BY MOUTH ONCE DAILY) 90 tablet 0  . senna (SENOKOT) 8.6 MG TABS tablet Take 1 tablet (8.6 mg total) by mouth 2 (two) times daily. 15 tablet 0  . simvastatin (ZOCOR) 10 MG tablet TAKE 1 TABLET BY MOUTH AT BEDTIME (Patient taking differently: Take 10 mg by mouth at bedtime. ) 90 tablet 0  . pioglitazone (ACTOS) 30 MG tablet Take 1 tablet (30 mg total) by mouth daily. (Patient not taking: Reported on 03/31/2019) 90 tablet 1   No current facility-administered medications on file prior to visit.    BP (!) 93/54 (BP Location: Right Arm, Patient Position: Sitting, Cuff Size: Small)   Pulse 78   Temp (!) 96.8 F (36 C) (Temporal)   Resp 16   Ht 5\' 4"  (1.626 m)   Wt 139 lb (63 kg)   SpO2 96%   BMI 23.86 kg/m       Objective:   Physical Exam        Assessment & Plan:  Acute hypoxic respiratory failure/Pulmonary nodules- She is scheduled to see pulmonology on 04/09/2019. Oxygen is currently stable on room air.   Tobacco abuse- quit since her admission to the hospital. I commended her on this.   Atypical  chest pain-will need follow-up with cardiology as an outpatient.  Will need follow-up with cardiology.  She is scheduled to see cardiology on April 08, 2019  History of atrial flutter-she was maintained on Coreg and as flecainide. Rate stable today.  Diabetes type 2/DKA - uncontrolled. She had insulin training in the hospital and is agreeable to start insulin. I have advised her to begin lantus 10 units HS, continue glipizide, send me readings in 1 week.  Will refer to endocrinology for help with her sugars.   Hypothyroid-uncontrolled on current dose of Tapazole.  Will refer to endocrinology. Lab Results  Component Value Date   TSH 0.132 (L) 03/22/2019   Hemochromatosis- check  Cbc, ferritin  45 minutes spent on this face to face encounter today

## 2019-04-01 NOTE — Patient Instructions (Addendum)
Please begin lantus 10 units once daily.  Send me your blood sugar readings via mychart in 1 week. Keep your upcoming appointments with cardiology/pulmonology. Complete your antibiotics. You should be contacted about your referral to endocrinology for your sugar and your thyroid.    Below are two ways to schedule a Covid-19 Vaccine:  Please visit [email or web address] to register or call [phone]  Or call:  [facility] Covid-19 vaccine scheduling at [phone]

## 2019-04-02 LAB — BASIC METABOLIC PANEL
BUN: 16 mg/dL (ref 6–23)
CO2: 33 mEq/L — ABNORMAL HIGH (ref 19–32)
Calcium: 10.1 mg/dL (ref 8.4–10.5)
Chloride: 90 mEq/L — ABNORMAL LOW (ref 96–112)
Creatinine, Ser: 0.84 mg/dL (ref 0.40–1.20)
GFR: 68.32 mL/min (ref >60.00)
Glucose, Bld: 320 mg/dL — ABNORMAL HIGH (ref 70–99)
Potassium: 4.2 mEq/L (ref 3.5–5.1)
Sodium: 134 mEq/L — ABNORMAL LOW (ref 135–145)

## 2019-04-02 LAB — CBC WITH DIFFERENTIAL/PLATELET
Basophils Absolute: 0.1 10*3/uL (ref 0.0–0.1)
Basophils Relative: 0.8 % (ref 0.0–3.0)
Eosinophils Absolute: 0.3 10*3/uL (ref 0.0–0.7)
Eosinophils Relative: 2.1 % (ref 0.0–5.0)
HCT: 46.6 % — ABNORMAL HIGH (ref 36.0–46.0)
Hemoglobin: 15.7 g/dL — ABNORMAL HIGH (ref 12.0–15.0)
Lymphocytes Relative: 15.4 % (ref 12.0–46.0)
Lymphs Abs: 2 10*3/uL (ref 0.7–4.0)
MCHC: 33.8 g/dL (ref 30.0–36.0)
MCV: 91.9 fl (ref 78.0–100.0)
Monocytes Absolute: 0.7 10*3/uL (ref 0.1–1.0)
Monocytes Relative: 5.5 % (ref 3.0–12.0)
Neutro Abs: 9.9 10*3/uL — ABNORMAL HIGH (ref 1.4–7.7)
Neutrophils Relative %: 76.2 % (ref 43.0–77.0)
Platelets: 461 10*3/uL — ABNORMAL HIGH (ref 150.0–400.0)
RBC: 5.06 Mil/uL (ref 3.87–5.11)
RDW: 13.2 % (ref 11.5–15.5)
WBC: 13 10*3/uL — ABNORMAL HIGH (ref 4.0–10.5)

## 2019-04-02 NOTE — Progress Notes (Signed)
Cardiology Office Note    Date:  04/08/2019   ID:  Christy Abbott, DOB [birth year], MRN [id number]  PCP:  [name], NP  Cardiologist: Larae Grooms, MD EPS: Cristopher Peru, MD  Chief Complaint  Patient presents with  . Hospitalization Follow-up    History of Present Illness:  Christy Abbott is a 64 y.o. female with history of atrial flutter with normal GXT 2019 treated with flecainide and Coreg, ablation in 2014  Was admitted 03/21/19 with pleuritic chest pain and shortness of breath treated for URI and uncontrolled blood sugars found to be in atrial flutter which converted to normal sinus rhythm with IV diltiazem.  She was continued on flecainide and Coreg.  She was not on DOAC as this breakthrough was secondary to acute illness and she has not had paroxysms of this outside of this event.  03/22/2019 normal LVEF 60 to 65% with grade 1 DD.  Patient comes in for hospital f/u. Has fatigue. Says she didn't feel the atrial flutter. No chest pain, shortness of breath, dizziness or presyncope. Sugars better controlled on insulin.    Past Medical History:  Diagnosis Date  . Atrial flutter [facility])    s/p CTI by Dr Lovena Le 02/2013  . Chest pain   . Diabetes mellitus without complication ([place])   . 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 Hemochromatosis associated with compound heterozygous mutation in HFE gene ([place]) 12/10/2017  . Hypertension   . Hyperthyroidism 10/31/2014    Past Surgical History:  Procedure Laterality Date  . ABLATION  03-04-2013   CTI by Dr Lovena Le for atrial flutter  . ATRIAL FLUTTER ABLATION N/A 03/04/2013   Procedure: ATRIAL FLUTTER ABLATION;  Surgeon: Evans Lance, MD;  Location: [facility] CATH LAB;  Service: Cardiovascular;  Laterality: N/A;  . CHOLECYSTECTOMY  1982  . ESOPHAGOGASTRODUODENOSCOPY N/A 05/20/2014   Procedure: ESOPHAGOGASTRODUODENOSCOPY (EGD);  Surgeon: Teena Irani, MD;  Location: [facility];  Service: Endoscopy;  Laterality:  N/A;  . TONSILLECTOMY  1972 ?    Current Medications: Current Meds  Medication Sig  . albuterol (VENTOLIN HFA) 108 (90 Base) MCG/ACT inhaler Inhale 2 puffs into the lungs every 6 (six) hours as needed for wheezing or shortness of breath.  Marland Kitchen Bioflavonoid Products (ESTER C PO) Take 1 tablet by mouth daily.  . Calcium Carb-Cholecalciferol (CALTRATE 600+D3 PO) Take 1 tablet by mouth daily.  . carvedilol (COREG) 12.5 MG tablet TAKE 1 TABLET BY MOUTH TWICE DAILY ( PATIENT MUST KEEP UPCOMING APPOINTMENT )  . cephALEXin (KEFLEX) 500 MG capsule Take 1 capsule (500 mg total) by mouth 3 (three) times daily for 14 days.  . flecainide (TAMBOCOR) 50 MG tablet TAKE 1 TABLET BY MOUTH TWICE DAILY, MAY TAKE AN ADDITIONAL 1 TABLET AS NEEDED. Please keep upcoming appt in January. Thank you  . furosemide (LASIX) 20 MG tablet Take 1 tablet (20 mg total) by mouth daily as needed.  Marland Kitchen glimepiride (AMARYL) 4 MG tablet Take 1 tablet (4 mg total) by mouth daily with breakfast. TAKE 1 TABLET BY MOUTH ONCE DAILY BEFORE BREAKFAST  . guaiFENesin-dextromethorphan (ROBITUSSIN DM) 100-10 MG/5ML syrup Take 5 mLs by mouth every 4 (four) hours as needed for cough.  . insulin glargine (LANTUS) 100 unit/mL SOPN Inject 0.1 mLs (10 Units total) into the skin daily.  . Insulin Pen Needle (B-D UF III MINI PEN NEEDLES) 31G X 5 MM MISC Use with insulin pen  . Lancets 30G MISC Check sugar twice daily.  Marland Kitchen  lisinopril-hydrochlorothiazide (PRINZIDE,ZESTORETIC) 20-25 MG tablet Take 1 tablet by mouth daily.  . metFORMIN (GLUCOPHAGE) 500 MG tablet Take 2 tablets (1,000 mg total) by mouth 2 (two) times daily.  . methimazole (TAPAZOLE) 10 MG tablet Take 1.5 tablets (15 mg total) by mouth daily.  . Omega-3 Fatty Acids (FISH OIL) 1200 MG CAPS Take 1,200 mg by mouth daily.   . potassium chloride SA (KLOR-CON) 20 MEQ tablet TAKE 1  BY MOUTH ONCE DAILY  . senna (SENOKOT) 8.6 MG tablet Take 1 tablet by mouth as needed for constipation.  . simvastatin  (ZOCOR) 10 MG tablet TAKE 1 TABLET BY MOUTH AT BEDTIME  . [DISCONTINUED] furosemide (LASIX) 20 MG tablet Take 2 tablets (40 mg total) by mouth daily.  . [DISCONTINUED] senna (SENOKOT) 8.6 MG TABS tablet Take 1 tablet (8.6 mg total) by mouth 2 (two) times daily. (Patient taking differently: Take 1 tablet by mouth as needed. )     Allergies:   Jardiance [empagliflozin] and Trazodone and nefazodone   Social History   Socioeconomic History  . Marital status: Married    Spouse name: Not on file  . Number of children: Not on file  . Years of education: Not on file  . Highest education level: Not on file  Occupational History  . Not on file  Tobacco Use  . Smoking status: Former Smoker    Packs/day: 1.00    Types: Cigarettes  . Smokeless tobacco: Never Used  Substance and Sexual Activity  . Alcohol use: No    Alcohol/week: 0.0 standard drinks  . Drug use: No  . Sexual activity: Not on file  Other Topics Concern  . Not on file  Social History Narrative   Married   Clinical cytogeneticist- full time   Daughter- 2 grandchildren, live in [place]   Enjoys playing on computer.     Social Determinants of Health   Financial Resource Strain:   . Difficulty of Paying Living Expenses: Not on file  Food Insecurity:   . Worried About Charity fundraiser in the Last Year: Not on file  . Ran Out of Food in the Last Year: Not on file  Transportation Needs:   . Lack of Transportation (Medical): Not on file  . Lack of Transportation (Non-Medical): Not on file  Physical Activity:   . Days of Exercise per Week: Not on file  . Minutes of Exercise per Session: Not on file  Stress:   . Feeling of Stress : Not on file  Social Connections:   . Frequency of Communication with Friends and Family: Not on file  . Frequency of Social Gatherings with Friends and Family: Not on file  . Attends Religious Services: Not on file  . Active Member of Clubs or Organizations: Not on file  . Attends Archivist  Meetings: Not on file  . Marital Status: Not on file     Family History:  The patient's   family history includes Arrhythmia in her mother; Atrial fibrillation in her mother; Diabetes in her mother; Hodgkin's lymphoma in her father.   ROS:   Please see the history of present illness.    ROS All other systems reviewed and are negative.   PHYSICAL EXAM:   VS:  BP 100/62   Pulse 62   Ht 5\' 4"  (1.626 m)   Wt 139 lb (63 kg)   SpO2 99%   BMI 23.86 kg/m   Physical Exam  GEN: Thin, in no acute distress  Neck:  no JVD, carotid bruits, or masses Cardiac:RRR; no murmurs, rubs, or gallops  Respiratory:  Decreased breath sounds but clear to auscultation bilaterally, normal work of breathing GI: soft, nontender, nondistended, + BS Ext: without cyanosis, clubbing, or edema, Good distal pulses bilaterally Neuro:  Alert and Oriented x 3 Psych: euthymic mood, full affect  Wt Readings from Last 3 Encounters:  04/08/19 139 lb (63 kg)  04/01/19 139 lb (63 kg)  03/28/19 146 lb 3.2 oz (66.3 kg)      Studies/Labs Reviewed:   EKG:  EKG is not ordered today.   Recent Labs: 03/22/2019: ALT 31; TSH 0.132 03/27/2019: Magnesium 1.5 04/01/2019: BUN 16; Creatinine, Ser 0.84; Hemoglobin 15.7; Platelets 461.0; Potassium 4.2; Sodium 134   Lipid Panel    Component Value Date/Time   CHOL 149 02/21/2018 0955   TRIG 315 (H) 02/21/2018 0955   HDL 40 02/21/2018 0955   CHOLHDL 3.7 02/21/2018 0955   CHOLHDL 4 09/04/2016 1810   VLDL 38.2 09/04/2016 1810   LDLCALC 46 02/21/2018 0955    Additional studies/ records that were reviewed today include:  2D echo 03/22/2019  ECHO:      1. Definity contrast agent was given IV to delineate the left ventricular endocardial borders.  2. Left ventricular ejection fraction, by visual estimation, is 60 to 65%. The left ventricle has normal function. There is no left ventricular hypertrophy.  3. Left ventricular diastolic parameters are consistent with Grade I diastolic  dysfunction (impaired relaxation).  4. The left ventricle has no regional wall motion abnormalities.  5. Global right ventricle has normal systolic function.The right ventricular size is normal.  6. Left atrial size was normal.  7. Right atrial size was normal.  8. The mitral valve is normal in structure. Trivial mitral valve regurgitation. No evidence of mitral stenosis.  9. The tricuspid valve is normal in structure. 10. The aortic valve was not well visualized. Aortic valve regurgitation is not visualized. 11. The pulmonic valve was not well visualized. Pulmonic valve regurgitation is not visualized. 12. Aortic dilatation noted. 13. There is mild dilatation of the aortic root measuring 40 mm. 14. The inferior vena cava is dilated in size with >50% respiratory variability, suggesting right atrial pressure of 8 mmHg. 15. Technically difficult; definity used; normal LV function; grade 1 diastolic dysfunction; mildly dilated aortic root.     ASSESSMENT:    1. Typical atrial flutter ([place])   2. Tobacco use disorder   3. Hyperthyroidism   4. Essential hypertension      PLAN:  In order of problems listed above:  Atrial flutter controlled with flecainide and carvedilol.  Not on DOAC.  She did have recurrence when hospitalized for URI earlier this month but converted with IV diltiazem.  Plan was not to commit her to a DOAC since she has not had breakthroughs other than acute illness.  Heart rate is regular today.  Labs by PCP were normal  Tobacco abuse quit when she went into the hospital  Hyperthyroidism on Tapazole  HTN-BP low will stop lasix and use prn. She's on HCTZ and heart function normal.   Medication Adjustments/Labs and Tests Ordered: Current medicines are reviewed at length with the patient today.  Concerns regarding medicines are outlined above.  Medication changes, Labs and Tests ordered today are listed in the Patient Instructions below. Patient Instructions    Medication Instructions:  Your physician has recommended you make the following change in your medication:   1. Stop Lasix 2. Take your lasix,  20mg  tablet, once daily as needed for swelling, weight gain, or shortness of breath.  Labwork: None ordered.  Testing/Procedures: None ordered.  Follow-Up: Your physician recommends that you schedule a follow-up appointment   March 26 @ 9:40am with Dr. Irish Lack     Any Other Special Instructions Will Be Listed Below (If Applicable).     If you need a refill on your cardiac medications before your next appointment, please call your pharmacy.     Sumner Boast, PA-C  04/08/2019 11:36 AM    [facility] [place], [place], [place]  [address] Phone: [phone]; Fax: [phone]

## 2019-04-08 ENCOUNTER — Telehealth: Payer: Self-pay | Admitting: Family

## 2019-04-08 ENCOUNTER — Ambulatory Visit (INDEPENDENT_AMBULATORY_CARE_PROVIDER_SITE_OTHER): Payer: 59 | Admitting: Physician Assistant

## 2019-04-08 ENCOUNTER — Encounter: Payer: Self-pay | Admitting: Physician Assistant

## 2019-04-08 ENCOUNTER — Other Ambulatory Visit: Payer: Self-pay

## 2019-04-08 VITALS — BP 100/62 | HR 62 | Ht 64.0 in | Wt 139.0 lb

## 2019-04-08 DIAGNOSIS — E0590 Thyrotoxicosis, unspecified without thyrotoxic crisis or storm: Secondary | ICD-10-CM | POA: Diagnosis not present

## 2019-04-08 DIAGNOSIS — I483 Typical atrial flutter: Secondary | ICD-10-CM

## 2019-04-08 DIAGNOSIS — I10 Essential (primary) hypertension: Secondary | ICD-10-CM

## 2019-04-08 DIAGNOSIS — F17200 Nicotine dependence, unspecified, uncomplicated: Secondary | ICD-10-CM | POA: Diagnosis not present

## 2019-04-08 MED ORDER — FUROSEMIDE 20 MG PO TABS: 20.0000 mg | ORAL_TABLET | Freq: Every day | ORAL | 0 refills | Status: DC | PRN

## 2019-04-08 NOTE — H&P (View-Only) (Signed)
History of Present Illness Christy Abbott is a 64 y.o. female current every day smoker ( Quit when hospitalized 03/2019)  with recent hospitalization for Gram negative sepsis, Acute of Chronic hypoxemia : acute related to volume overloaded state of heart, chronic related to likely undiagnosed emphysema.  Additionally she had pulmonary Nodules on CT with left hilar adenopathy concerning for bronchogenic cancer in a current every day smoker.She was seen as an inpatient by Dr. Erskine Emery.   Hospital Admission  Admit date: 03/21/2019 Discharge date: 03/28/2019  Equipment/Devices: Oxygen via nasal cannula at 2 L/min.  Oxygen saturations dropped into the 80s on ambulation on room air.   Synopsis of Admission: 64 year old  With HTN, A flutter, DM, Current every day smoker with a 72 pack year smoking history, and nodules on CTA concerning for possible bronchogenic cancer vs infection. CXR with effusions and pulmonary edema.  Admitted and treated for Ecoli Bacteremia with persistent hypoxia this hospitalization. CTA  Was negative for PE, Echo was ok. She was requiring 4 L oxygen 1/14,  she was diuresed with a 4 L  Output and improvement in her respiratory status. PCCM were  consulted for COPD/ hypoxic respiratory failure  evaluation and management of pulmonary nodules. Pulmonary Evaluation determined :  Acute on chronic hypoxemia- acute related to volume overloaded state of heart, chronic related to likely undiagnosed emphysema, suppose she could also have a shunt somewhere, DLCO would be helpful Additionally she had pulmonary Nodules on CT: many look cavitary/infectious; however the left hilar nodule is large and different from the others, There was suspicion that  she may have a cancer hiding in all this.  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  -Hypoxemia itself is longstanding, She was  send her home with 2L to use at night and  with activity  -She had a long course of antibiotics (3 weeks) for E coli septic emboli in lungs and bacteremia (cefdinir is good option). -Plan was to do a  PET scan around 4 weeks to determine need for bronchoscopy ( Infection vs neoplasm)  She developed a flutter as an inpatient, she is being followed by cardiology. ( Last OV 04/08/2019) She also was diagnosed with diabetes type 2/DKA - uncontrolled. She had insulin training in the hospital and has  agreeable to start insulin. [name] advised her to begin lantus 10 units HS, continue glipizide. Pt was to report  readings in 1 week to [name]. She was referred to endocrinology for help with her sugars.   Discharged on  Keflex for 2 more weeks post  discharge to finish 3 weeks of antibiotic therapy total as per pulmonary recommendations.  Outpatient follow-up closely with pulmonary. Needs PET scan ordered 2/10 ish ( 4 weeks after antibiotics started ) need 4 weeks of treatment before she can have PET She did develop atrial flutter as an inpatient -she was maintained on Coreg and as flecainide >> No anticoagulation>>  She has followed up with cardiology 04/07/2019 and they  have adjusted her diuretic to prn  as her BP was soft on day of OV.    04/09/2019  Pt presents for follow up, she states she has been doing well. She has totally quit smoking.We celebrated!!  She no longer needs her oxygen. Her sats have been above 94% for 5 days with exercise and at rest on RA. She has been compliant with her antibiotics. She  completed 5 days of Cipro and she is still on her 14 days of Keflex. She states her chest pain had improved. She does still have some night time pain in her shoulder blade area. She states it does self resolve. She denies any fever. She does not have a cough. She is not coughing up secretions. She has not had use her albuterol inhaler at all since discharge. She has stopped her Lasix  daily. She was advised to do this by  [facility] cardiology as her BP was too low. . She is to take this prn swelling. She denies any fever, chest pain, orthopnea or hemoptysis.      Test Results: CXR 04/09/2019 Interval resolution of small pleural effusions and basilar airspace disease. Residual focus of nodularity and mild airspace disease in the right upper lobe, likely corresponding to the CT demonstrated possible pulmonary nodule for which CT follow-up was previously recommended, refer to 05/02/2019 CT chest report.  CXR 03/27/2019 Unchanged diffuse interstitial pulmonary opacity, likely edema, and small bilateral pleural effusions.  CTA: 03/21/2018 No pulmonary embolus to the segmental level. Multiple nodular opacities in both lungs, the largest of which measures 2.0 cm. These are concerning for malignancy. Non-contrast chest CT at 3-6 months is recommended  Micro Data:  03/23/2019>> C Diff>> Negative 03/22/2019 Blood Cx:>> E Coli, Enterobacteriaceae  Antimicrobials:  Rocephin 03/23/2019>>03/28/2019 Additional 2 weeks Keflex post discharge Additional 5 days Flagyl post discharge  2D echo 03/22/2019  Left ventricular ejection fraction, by visual estimation, is 60 to 65%. The left ventricle has normal function. There is no left ventricular hypertrophy. Left ventricular diastolic parameters are consistent with Grade I diastolic dysfunction (impaired relaxation). The left ventricle has no regional wall motion abnormalities. Global right ventricle has normal systolic function.The right ventricular size is normal. Left atrial size was normal. Right atrial size was normal. The mitral valve is normal in structure. Trivial mitral valve regurgitation. No evidence of mitral stenosis. The tricuspid valve is normal in structure.  The aortic valve was not well visualized. Aortic valve regurgitation is not visualized.  The pulmonic valve was not well visualized. Pulmonic valve regurgitation is not visualized.    Aortic dilatation noted.   There is mild dilatation of the aortic root measuring 40 mm.   The inferior vena cava is dilated in size with >50% respiratory variability, suggesting right atrial pressure of 8 mmHg.   Technically difficult; definity used; normal LV function; grade 1 diastolic dysfunction; mildly dilated aortic root  CBC Latest Ref Rng & Units 04/01/2019 03/25/2019 03/23/2019  WBC 4.0 - 10.5 K/uL 13.0(H) 10.8(H) 11.0(H)  Hemoglobin 12.0 - 15.0 g/dL 15.7(H) 13.7 14.7  Hematocrit 36.0 - 46.0 % 46.6(H) 40.2 43.0  Platelets 150.0 - 400.0 K/uL 461.0(H) 162 130(L)    BMP Latest Ref Rng & Units 04/01/2019 03/28/2019 03/27/2019  Glucose 70 - 99 mg/dL 320(H) 102(H) -  BUN 6 - 23 mg/dL 16 6(L) -  Creatinine 0.40 - 1.20 mg/dL 0.84 0.54 -  BUN/Creat Ratio 12 - 28 - - -  Sodium 135 - 145 mEq/L 134(L) 139 -  Potassium 3.5 - 5.1 mEq/L 4.2 3.7 3.8  Chloride 96 - 112 mEq/L 90(L) 97(L) -  CO2 19 - 32 mEq/L 33(H) 31 -  Calcium 8.4 - 10.5 mg/dL 10.1 8.5(L) -    BNP    Component Value Date/Time   BNP 79.4 06/01/2014 1203    ProBNP    Component Value Date/Time   PROBNP 50.3 12/21/2012 1514  PFT No results found for: FEV1PRE, FEV1POST, FVCPRE, FVCPOST, TLC, DLCOUNC, PREFEV1FVCRT, PSTFEV1FVCRT  CT ABDOMEN PELVIS WO CONTRAST  Result Date: 03/24/2019 CLINICAL DATA:  64 year old female with abdominal pain. EXAM: CT ABDOMEN AND PELVIS WITHOUT CONTRAST TECHNIQUE: Multidetector CT imaging of the abdomen and pelvis was performed following the standard protocol without IV contrast. COMPARISON:  Chest CT dated 03/22/2019. FINDINGS: Evaluation of this exam is limited in the absence of intravenous contrast. Lower chest: Partially visualized small bilateral pleural effusions, new since the study of 03/22/2019. Bilateral lower lobe partial consolidative change with air bronchograms may represent atelectasis or pneumonia. Clinical correlation and follow-up recommended. Additional patchy areas of nodular  and hazy density noted in the lingula and right middle lobe. There is no intra-abdominal free air or free fluid. Hepatobiliary: The liver is grossly unremarkable. Cholecystectomy. There is mild periportal edema. No retained calcified stone noted in the central CBD. Pancreas: Mildly atrophic pancreas. No active inflammatory changes or dilatation of the main pancreatic duct. Spleen: Normal in size without focal abnormality. Adrenals/Urinary Tract: The adrenal glands are unremarkable. There is no hydronephrosis or nephrolithiasis on either side. The visualized ureters and urinary bladder appear unremarkable. Stomach/Bowel: There is sigmoid diverticulosis. There is segmental thickening and inflammatory changes of the sigmoid colon which may represent acute diverticulitis versus mild segmental sigmoid colitis. Clinical correlation is recommended. No diverticular abscess or perforation. There is extension of the inflammatory process superiorly along the mesenteric vein with infiltration of the left retroperitoneal fat. If there is clinical concern for ischemia further evaluation with CT angiography is recommended. There is no bowel obstruction. The appendix is normal. Vascular/Lymphatic: Advanced aortoiliac atherosclerotic disease. The IVC is unremarkable. No portal venous gas. Several small mildly rounded retroperitoneal lymph nodes, likely reactive. No adenopathy. Reproductive: The uterus and ovaries are grossly unremarkable. Other: Mild subcutaneous edema. Musculoskeletal: No acute or significant osseous findings. IMPRESSION: 1. Sigmoid diverticulitis versus possible colitis. Clinical correlation is recommended. No drainable fluid collection abscess. No pneumatosis or portal venous gas. If there is clinical concern for an ischemic process, further evaluation with CT angiography recommended. 2. No bowel obstruction.  Normal appendix. 3. Small bilateral pleural effusions and bilateral atelectasis or infiltrate, new since  the prior CT. Clinical correlation and follow-up recommended. 4.  Aortic Atherosclerosis (ICD10-I70.0). Electronically Signed   By: Anner Crete M.D.   On: 03/24/2019 20:26   DG Chest 2 View  Result Date: 04/09/2019 CLINICAL DATA:  Follow-up pneumonia EXAM: CHEST - 2 VIEW COMPARISON:  03/27/2019, 03/25/2019, CT 03/21/2018 FINDINGS: Clearing of previously noted bilateral pleural effusions and basilar airspace disease. Faint focus of slightly nodular opacity in the right upper lobe. Other lung nodules by CT are not well seen radiographically. Normal heart size. No pneumothorax. IMPRESSION: 1. Interval resolution of small pleural effusions and basilar airspace disease. 2. Residual focus of nodularity and mild airspace disease in the right upper lobe, likely corresponding to the CT demonstrated possible pulmonary nodule for which CT follow-up was previously recommended, refer to 05/02/2019 CT chest report. Electronically Signed   By: Donavan Foil M.D.   On: 04/09/2019 15:53   DG Chest 2 View  Result Date: 03/27/2019 CLINICAL DATA:  Oxygen release EXAM: CHEST - 2 VIEW COMPARISON:  03/25/2019 FINDINGS: Unchanged examination with mild diffuse interstitial pulmonary opacity and small bilateral pleural effusions. IMPRESSION: Unchanged diffuse interstitial pulmonary opacity, likely edema, and small bilateral pleural effusions. Electronically Signed   By: Eddie Candle M.D.   On: 03/27/2019 15:55   DG Chest 2  View  Result Date: 03/25/2019 CLINICAL DATA:  Right-sided chest pain and shortness of breath for 5 days. EXAM: CHEST - 2 VIEW COMPARISON:  03/22/2019 FINDINGS: The cardiac silhouette, mediastinal and hilar contours are within normal limits. Increased interstitial markings and mild peribronchial thickening. There are small bilateral pleural effusions and overlying bibasilar atelectasis. Small pulmonary nodule seen on the recent chest CT are not well demonstrated on the x-ray. IMPRESSION: 1. Bronchitic type  changes suggesting bronchitis along with small bilateral pleural effusions and bibasilar atelectasis. 2. No focal pulmonary lesions. Small pulmonary nodule seen on CT scan are not well demonstrated. Electronically Signed   By: Marijo Sanes M.D.   On: 03/25/2019 11:01   CT Angio Chest PE W and/or Wo Contrast  Result Date: 03/22/2019 CLINICAL DATA:  Shortness of breath and right lower lobe pain for 4 days EXAM: CT ANGIOGRAPHY CHEST WITH CONTRAST TECHNIQUE: Multidetector CT imaging of the chest was performed using the standard protocol during bolus administration of intravenous contrast. Multiplanar CT image reconstructions and MIPs were obtained to evaluate the vascular anatomy. CONTRAST:  117mL OMNIPAQUE IOHEXOL 350 MG/ML SOLN COMPARISON:  None. FINDINGS: Cardiovascular: --Pulmonary arteries: Contrast injection is sufficient to demonstrate satisfactory opacification of the pulmonary arteries to the segmental level, with attenuation of at least 200 HU at the main pulmonary artery. There is no pulmonary embolus. The main pulmonary artery is within normal limits for size. --Aorta: Limited opacification of the aorta due to bolus timing optimization for the pulmonary arteries. The course and caliber of the aorta are normal. Conventional 3 vessel aortic branching pattern. There is mild aortic atherosclerosis. --Heart: Normal size. No pericardial effusion. Mediastinum/Nodes: No mediastinal, hilar or axillary lymphadenopathy. The visualized thyroid and thoracic esophageal course are unremarkable. Lungs/Pleura: Multiple nodular opacities in the right lung, largest of which is in the posterior right upper lobe measures approximately 1.5 cm. There is a 2 cm left parahilar nodule. No pleural effusion. Upper Abdomen: Contrast bolus timing is not optimized for evaluation of the abdominal organs. The visualized portions of the organs of the upper abdomen are normal. Musculoskeletal: No chest wall abnormality. No bony spinal  canal stenosis. Review of the MIP images confirms the above findings. IMPRESSION: 1. No pulmonary embolus to the segmental level. 2. Multiple nodular opacities in both lungs, the largest of which measures 2.0 cm. These are concerning for malignancy. Non-contrast chest CT at 3-6 months is recommended. If the nodules are stable at time of repeat CT, then future CT at 18-24 months (from today's scan) is considered optional for low-risk patients, but is recommended for high-risk patients. This recommendation follows the consensus statement: Guidelines for Management of Incidental Pulmonary Nodules Detected on CT Images: From the Fleischner Society 2017; Radiology 2017; 284:228-243. 3. Aortic Atherosclerosis (ICD10-I70.0). Electronically Signed   By: Ulyses Jarred M.D.   On: 03/22/2019 06:05   DG Chest Portable 1 View  Result Date: 03/22/2019 CLINICAL DATA:  64 year old female with shortness of breath. EXAM: PORTABLE CHEST 1 VIEW COMPARISON:  Chest radiograph dated 06/01/2014 FINDINGS: Minimal bibasilar interstitial nodularity may represent interstitial prominence. Atypical infiltrate is not excluded. Clinical correlation is recommended. No focal consolidation, pleural effusion, or pneumothorax. The cardiac silhouette is within normal limits. Coronary vascular calcification and atherosclerotic calcification of the aortic arch. No acute osseous pathology. IMPRESSION: 1. No focal consolidation. 2. Minimal bibasilar interstitial nodularity may represent interstitial prominence or atypical infection. Clinical correlation is recommended. Electronically Signed   By: Anner Crete M.D.   On: 03/22/2019 00:49  ECHOCARDIOGRAM COMPLETE  Result Date: 03/23/2019   ECHOCARDIOGRAM REPORT   Patient Name:   OONA TRAMMEL Date of Exam: 03/22/2019 Medical Rec #:  [id number]        Height:       64.0 in Accession #:    [id number]       Weight:       155.0 lb Date of Birth:  10/26/1955         BSA:          1.76 m Patient Age:     43 years         BP:           143/86 mmHg Patient Gender: F                HR:           100 bpm. Exam Location:  Inpatient Procedure: 2D Echo and Intracardiac Opacification Agent Indications:    Atrial Flutter 427.32/I48.92  History:        Patient has prior history of Echocardiogram examinations, most                 recent 12/22/2012. Risk Factors:Diabetes and Hypertension.  Sonographer:    Clayton Lefort RDCS (AE) Referring Phys: [id number] RONDELL A SMITH IMPRESSIONS  1. Definity contrast agent was given IV to delineate the left ventricular endocardial borders.  2. Left ventricular ejection fraction, by visual estimation, is 60 to 65%. The left ventricle has normal function. There is no left ventricular hypertrophy.  3. Left ventricular diastolic parameters are consistent with Grade I diastolic dysfunction (impaired relaxation).  4. The left ventricle has no regional wall motion abnormalities.  5. Global right ventricle has normal systolic function.The right ventricular size is normal.  6. Left atrial size was normal.  7. Right atrial size was normal.  8. The mitral valve is normal in structure. Trivial mitral valve regurgitation. No evidence of mitral stenosis.  9. The tricuspid valve is normal in structure. 10. The aortic valve was not well visualized. Aortic valve regurgitation is not visualized. 11. The pulmonic valve was not well visualized. Pulmonic valve regurgitation is not visualized. 12. Aortic dilatation noted. 13. There is mild dilatation of the aortic root measuring 40 mm. 14. The inferior vena cava is dilated in size with >50% respiratory variability, suggesting right atrial pressure of 8 mmHg. 15. Technically difficult; definity used; normal LV function; grade 1 diastolic dysfunction; mildly dilated aortic root. FINDINGS  Left Ventricle: Left ventricular ejection fraction, by visual estimation, is 60 to 65%. The left ventricle has normal function. Definity contrast agent was given IV to delineate the  left ventricular endocardial borders. The left ventricle has no regional wall motion abnormalities. There is no left ventricular hypertrophy. Left ventricular diastolic parameters are consistent with Grade I diastolic dysfunction (impaired relaxation). Normal left atrial pressure. Right Ventricle: The right ventricular size is normal. Global RV systolic function is has normal systolic function. Left Atrium: Left atrial size was normal in size. Right Atrium: Right atrial size was normal in size Pericardium: There is no evidence of pericardial effusion. Mitral Valve: The mitral valve is normal in structure. Trivial mitral valve regurgitation. No evidence of mitral valve stenosis by observation. Tricuspid Valve: The tricuspid valve is normal in structure. Tricuspid valve regurgitation is trivial. Aortic Valve: The aortic valve was not well visualized. Aortic valve regurgitation is not visualized. Pulmonic Valve: The pulmonic valve was not well visualized. Pulmonic valve regurgitation is not visualized. Pulmonic  regurgitation is not visualized. Aorta: Aortic dilatation noted. There is mild dilatation of the aortic root measuring 40 mm. Venous: The inferior vena cava is dilated in size with greater than 50% respiratory variability, suggesting right atrial pressure of 8 mmHg.  Additional Comments: Technically difficult; definity used; normal LV function; grade 1 diastolic dysfunction; mildly dilated aortic root.  LEFT VENTRICLE PLAX 2D LVIDd:         4.17 cm LVIDs:         2.95 cm LV PW:         0.98 cm LV IVS:        1.26 cm LVOT diam:     2.00 cm LV SV:         44 ml LV SV Index:   24.29 LVOT Area:     3.14 cm  RIGHT VENTRICLE             IVC RV S prime:     14.40 cm/s  IVC diam: 2.33 cm TAPSE (M-mode): 2.2 cm LEFT ATRIUM             Index       RIGHT ATRIUM           Index LA diam:        2.50 cm 1.42 cm/m  RA Area:     12.70 cm LA Vol (A2C):   40.6 ml 23.13 ml/m RA Volume:   32.90 ml  18.74 ml/m LA Vol (A4C):    15.0 ml 8.54 ml/m LA Biplane Vol: 27.0 ml 15.38 ml/m  AORTIC VALVE LVOT Vmax:   106.44 cm/s LVOT Vmean:  74.600 cm/s LVOT VTI:    0.196 m  AORTA Ao Root diam: 3.40 cm Ao Asc diam:  4.00 cm  SHUNTS Systemic VTI:  0.20 m Systemic Diam: 2.00 cm  Kirk Ruths MD Electronically signed by Kirk Ruths MD Signature Date/Time: 03/23/2019/7:16:15 AM    Final    US Abdomen Limited RUQ  Result Date: 03/23/2019 CLINICAL DATA:  Right upper quadrant pain EXAM: ULTRASOUND ABDOMEN LIMITED RIGHT UPPER QUADRANT COMPARISON:  None. FINDINGS: Gallbladder: Cholecystectomy. Common bile duct: Diameter: 7.7 mm. Liver: No focal lesion identified. Mildly increased parenchymal echogenicity. There is intrahepatic biliary dilatation. Portal vein is patent on color Doppler imaging with normal direction of blood flow towards the liver. Other: None. IMPRESSION: Mildly increased liver echogenicity likely reflecting steatosis. Mild intrahepatic and extrahepatic biliary dilatation probably related to post cholecystectomy state. Electronically Signed   By: Macy Mis M.D.   On: 03/23/2019 14:42     Past medical hx Past Medical History:  Diagnosis Date  . Atrial flutter [facility])    s/p CTI by Dr Lovena Le 02/2013  . Chest pain   . Diabetes mellitus without complication ([place])   . 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 Hemochromatosis associated with compound heterozygous mutation in HFE gene ([place]) 12/10/2017  . Hypertension   . Hyperthyroidism 10/31/2014     Social History   Tobacco Use  . Smoking status: Former Smoker    Packs/day: 1.00    Types: Cigarettes  . Smokeless tobacco: Never Used  Substance Use Topics  . Alcohol use: No    Alcohol/week: 0.0 standard drinks  . Drug use: No    Ms.Engelmann reports that she has quit smoking. Her smoking use included cigarettes. She smoked 1.00 pack per day. She has never used smokeless tobacco. She reports that she does not drink alcohol or use drugs.  Tobacco  Cessation: Pt. Has quit smoking.We have celebrated.  Past surgical hx, Family hx, Social hx all reviewed.  Current Outpatient Medications on File Prior to Visit  Medication Sig  . albuterol (VENTOLIN HFA) 108 (90 Base) MCG/ACT inhaler Inhale 2 puffs into the lungs every 6 (six) hours as needed for wheezing or shortness of breath.  Marland Kitchen Bioflavonoid Products (ESTER C PO) Take 1 tablet by mouth daily.  . Calcium Carb-Cholecalciferol (CALTRATE 600+D3 PO) Take 1 tablet by mouth daily.  . carvedilol (COREG) 12.5 MG tablet TAKE 1 TABLET BY MOUTH TWICE DAILY ( PATIENT MUST KEEP UPCOMING APPOINTMENT )  . cephALEXin (KEFLEX) 500 MG capsule Take 1 capsule (500 mg total) by mouth 3 (three) times daily for 14 days.  . flecainide (TAMBOCOR) 50 MG tablet TAKE 1 TABLET BY MOUTH TWICE DAILY, MAY TAKE AN ADDITIONAL 1 TABLET AS NEEDED. Please keep upcoming appt in January. Thank you  . furosemide (LASIX) 20 MG tablet Take 1 tablet (20 mg total) by mouth daily as needed.  Marland Kitchen glimepiride (AMARYL) 4 MG tablet Take 1 tablet (4 mg total) by mouth daily with breakfast. TAKE 1 TABLET BY MOUTH ONCE DAILY BEFORE BREAKFAST  . guaiFENesin-dextromethorphan (ROBITUSSIN DM) 100-10 MG/5ML syrup Take 5 mLs by mouth every 4 (four) hours as needed for cough.  . insulin glargine (LANTUS) 100 unit/mL SOPN Inject 0.1 mLs (10 Units total) into the skin daily.  . Insulin Pen Needle (B-D UF III MINI PEN NEEDLES) 31G X 5 MM MISC Use with insulin pen  . Lancets 30G MISC Check sugar twice daily.  Marland Kitchen lisinopril-hydrochlorothiazide (PRINZIDE,ZESTORETIC) 20-25 MG tablet Take 1 tablet by mouth daily.  . metFORMIN (GLUCOPHAGE) 500 MG tablet Take 2 tablets (1,000 mg total) by mouth 2 (two) times daily.  . methimazole (TAPAZOLE) 10 MG tablet Take 1.5 tablets (15 mg total) by mouth daily.  . Omega-3 Fatty Acids (FISH OIL) 1200 MG CAPS Take 1,200 mg by mouth daily.   . potassium chloride SA (KLOR-CON) 20 MEQ tablet TAKE 1  BY MOUTH ONCE DAILY  .  senna (SENOKOT) 8.6 MG tablet Take 1 tablet by mouth as needed for constipation.  . simvastatin (ZOCOR) 10 MG tablet TAKE 1 TABLET BY MOUTH AT BEDTIME   No current facility-administered medications on file prior to visit.     Allergies  Allergen Reactions  . Jardiance [Empagliflozin]     Causes heart palpitations  . Trazodone And Nefazodone     "hyped me up, could not sleep"    Review Of Systems:  Constitutional:   No  weight loss, night sweats,  Fevers, chills, fatigue, or  lassitude.  HEENT:   No headaches,  Difficulty swallowing,  Tooth/dental problems, or  Sore throat,                No sneezing, itching, ear ache, nasal congestion, post nasal drip,   CV:  No chest pain,  Orthopnea, PND, swelling in lower extremities, anasarca, dizziness, palpitations, syncope.   GI  No heartburn, indigestion, abdominal pain, nausea, vomiting, diarrhea, change in bowel habits, loss of appetite, bloody stools.   Resp: No shortness of breath with exertion or at rest.  No excess mucus, no productive cough,  No non-productive cough,  No coughing up of blood.  No change in color of mucus.  No wheezing.  No chest wall deformity  Skin: no rash or lesions.  GU: no dysuria, change in color of urine, no urgency or frequency.  No flank pain, no hematuria  MS:  No joint pain or swelling.  No decreased range of motion.  No back pain.  Psych:  No change in mood or affect. No depression or anxiety.  No memory loss.   Vital Signs BP (!) 96/52 (BP Location: Left Arm, Cuff Size: Normal)   Pulse 76   Temp (!) 97.4 F (36.3 C) (Temporal)   Ht 5\' 4"  (1.626 m)   Wt 140 lb 12.8 oz (63.9 kg)   SpO2 96%   BMI 24.17 kg/m    Physical Exam:  General- No distress,  A&Ox3, pleasant and appropriate ENT: No sinus tenderness, TM clear, pale nasal mucosa, no oral exudate,no post nasal drip, no LAN Cardiac: S1, S2, regular rate and rhythm, no murmur Chest: Bilateral chest excursion, No wheeze/ rales/ dullness;  no accessory muscle use, no nasal flaring, no sternal retractions Abd.: Soft Non-tender, ND, BS +, Body mass index is 24.17 kg/m. Ext: No clubbing cyanosis, edema Neuro:  normal strength, MAE x 4, A&O x 3, appropriate Skin: No rashes, No lesions, warm and dry Psych: normal mood and behavior   Assessment/Plan  Acute on chronic respiratory failure with hypoxemia (HCC)  Acute on chronic hypoxemia- acute related to volume overloaded state of heart, chronic related to likely undiagnosed emphysema, possible undiagnosed  shunt , DLCO would be helpful Resolved with antibiotics and diuresis Saturations are > 95% on RA with exertion and at rest, she no longer needs home oxygen Plan' Continue Keflex until gone Follow up PET scan the week of 04/28/2019 Follow up with Dr. Tamala Julian 04/29/2019 as is scheduled Saturation goals are > 92% Needs PFT's   Abnormal CT of the chest Pulmonary Nodules many nodules look cavitary/infectious; however the left hilar nodule is large and different from the others,suspicious she may have a cancer hiding in all this.  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 Plan Plan for a  long course of antibiotics (3 weeks) for E coli septic emboli in lungs and bacteremia (cefdinir is good option). -We will do a PET scan around 4 weeks ( 04/2019) to determine need for bronchoscopy. - Follow up with Dr. Tamala Julian after PET 05/09/2019 as is scheduled  Sepsis ([place]) E Coli Sepsis 2/2 diverticulitis BP remains soft most likely 2/2 diuresis Resolved  Plan Completed Antibiotic regimen as inpatient Completed Flagyl x additional 5 days at discharge Lasix changed from scheduled to prn per cards  Tobacco use disorder States she has quit smoking since discharge from the hospital We celebrated Plan Continued support of smoking cessation efforts Needs PFT's to evaluate for COPD and maintenance regimen If PET is negative  for suspicion of bronchogenic cancer, will need to enter the lung cancer screening population.   This appointment was 40 min long with over 50% of the time in direct face-to-face patient care, assessment, plan of care, and follow-up.   Magdalen Spatz, NP 04/09/2019  5:12 PM

## 2019-04-08 NOTE — Telephone Encounter (Addendum)
Left message on machine to call back to schedule.  Future lab order placed.

## 2019-04-08 NOTE — Patient Instructions (Addendum)
Medication Instructions:  Your physician has recommended you make the following change in your medication:   1. Stop Lasix 2. Take your lasix, 20mg  tablet, once daily as needed for swelling, weight gain, or shortness of breath.  Labwork: None ordered.  Testing/Procedures: None ordered.  Follow-Up: Your physician recommends that you schedule a follow-up appointment   March 26 @ 9:40am with Dr. Irish Lack     Any Other Special Instructions Will Be Listed Below (If Applicable).     If you need a refill on your cardiac medications before your next appointment, please call your pharmacy.

## 2019-04-08 NOTE — Telephone Encounter (Signed)
Please contact pt to schedule a lab appointment for ferritin level, dx hemochromatosis.

## 2019-04-08 NOTE — Progress Notes (Signed)
History of Present Illness Christy Abbott is a 64 y.o. female current every day smoker ( Quit when hospitalized 03/2019)  with recent hospitalization for Gram negative sepsis, Acute of Chronic hypoxemia : acute related to volume overloaded state of heart, chronic related to likely undiagnosed emphysema.  Additionally she had pulmonary Nodules on CT with left hilar adenopathy concerning for bronchogenic cancer in a current every day smoker.She was seen as an inpatient by Dr. Erskine Emery.   Hospital Admission  Admit date: 03/21/2019 Discharge date: 03/28/2019  Equipment/Devices: Oxygen via nasal cannula at 2 L/min.  Oxygen saturations dropped into the 64s on ambulation on room air.   Synopsis of Admission: 64 year old  With HTN, A flutter, DM, Current every day smoker with a 72 pack year smoking history, and nodules on CTA concerning for possible bronchogenic cancer vs infection. CXR with effusions and pulmonary edema.  Admitted and treated for Ecoli Bacteremia with persistent hypoxia this hospitalization. CTA  Was negative for PE, Echo was ok. She was requiring 4 L oxygen 1/14,  she was diuresed with a 4 L  Output and improvement in her respiratory status. PCCM were  consulted for COPD/ hypoxic respiratory failure  evaluation and management of pulmonary nodules. Pulmonary Evaluation determined :  Acute on chronic hypoxemia- acute related to volume overloaded state of heart, chronic related to likely undiagnosed emphysema, suppose she could also have a shunt somewhere, DLCO would be helpful Additionally she had pulmonary Nodules on CT: many look cavitary/infectious; however the left hilar nodule is large and different from the others, There was suspicion that  she may have a cancer hiding in all this.  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  -Hypoxemia itself is longstanding, She was  send her home with 2L to use at night and  with activity  -She had a long course of antibiotics (3 weeks) for E coli septic emboli in lungs and bacteremia (cefdinir is good option). -Plan was to do a  PET scan around 4 weeks to determine need for bronchoscopy ( Infection vs neoplasm)  She developed a flutter as an inpatient, she is being followed by cardiology. ( Last OV 04/08/2019) She also was diagnosed with diabetes type 2/DKA - uncontrolled. She had insulin training in the hospital and has  agreeable to start insulin. [name] advised her to begin lantus 10 units HS, continue glipizide. Pt was to report  readings in 1 week to [name]. She was referred to endocrinology for help with her sugars.   Discharged on  Keflex for 2 more weeks post  discharge to finish 3 weeks of antibiotic therapy total as per pulmonary recommendations.  Outpatient follow-up closely with pulmonary. Needs PET scan ordered 2/10 ish ( 4 weeks after antibiotics started ) need 4 weeks of treatment before she can have PET She did develop atrial flutter as an inpatient -she was maintained on Coreg and as flecainide >> No anticoagulation>>  She has followed up with cardiology 04/07/2019 and they  have adjusted her diuretic to prn  as her BP was soft on day of OV.    04/09/2019  Pt presents for follow up, she states she has been doing well. She has totally quit smoking.We celebrated!!  She no longer needs her oxygen. Her sats have been above 94% for 5 days with exercise and at rest on RA. She has been compliant with her antibiotics. She  completed 5 days of Cipro and she is still on her 14 days of Keflex. She states her chest pain had improved. She does still have some night time pain in her shoulder blade area. She states it does self resolve. She denies any fever. She does not have a cough. She is not coughing up secretions. She has not had use her albuterol inhaler at all since discharge. She has stopped her Lasix  daily. She was advised to do this by  [facility] cardiology as her BP was too low. . She is to take this prn swelling. She denies any fever, chest pain, orthopnea or hemoptysis.      Test Results: CXR 04/09/2019 Interval resolution of small pleural effusions and basilar airspace disease. Residual focus of nodularity and mild airspace disease in the right upper lobe, likely corresponding to the CT demonstrated possible pulmonary nodule for which CT follow-up was previously recommended, refer to 05/02/2019 CT chest report.  CXR 03/27/2019 Unchanged diffuse interstitial pulmonary opacity, likely edema, and small bilateral pleural effusions.  CTA: 03/21/2018 No pulmonary embolus to the segmental level. Multiple nodular opacities in both lungs, the largest of which measures 2.0 cm. These are concerning for malignancy. Non-contrast chest CT at 3-6 months is recommended  Micro Data:  03/23/2019>> C Diff>> Negative 03/22/2019 Blood Cx:>> E Coli, Enterobacteriaceae  Antimicrobials:  Rocephin 03/23/2019>>03/28/2019 Additional 2 weeks Keflex post discharge Additional 5 days Flagyl post discharge  2D echo 03/22/2019  Left ventricular ejection fraction, by visual estimation, is 60 to 65%. The left ventricle has normal function. There is no left ventricular hypertrophy. Left ventricular diastolic parameters are consistent with Grade I diastolic dysfunction (impaired relaxation). The left ventricle has no regional wall motion abnormalities. Global right ventricle has normal systolic function.The right ventricular size is normal. Left atrial size was normal. Right atrial size was normal. The mitral valve is normal in structure. Trivial mitral valve regurgitation. No evidence of mitral stenosis. The tricuspid valve is normal in structure.  The aortic valve was not well visualized. Aortic valve regurgitation is not visualized.  The pulmonic valve was not well visualized. Pulmonic valve regurgitation is not visualized.    Aortic dilatation noted.   There is mild dilatation of the aortic root measuring 40 mm.   The inferior vena cava is dilated in size with >50% respiratory variability, suggesting right atrial pressure of 8 mmHg.   Technically difficult; definity used; normal LV function; grade 1 diastolic dysfunction; mildly dilated aortic root  CBC Latest Ref Rng & Units 04/01/2019 03/25/2019 03/23/2019  WBC 4.0 - 10.5 K/uL 13.0(H) 10.8(H) 11.0(H)  Hemoglobin 12.0 - 15.0 g/dL 15.7(H) 13.7 14.7  Hematocrit 36.0 - 46.0 % 46.6(H) 40.2 43.0  Platelets 150.0 - 400.0 K/uL 461.0(H) 162 130(L)    BMP Latest Ref Rng & Units 04/01/2019 03/28/2019 03/27/2019  Glucose 70 - 99 mg/dL 320(H) 102(H) -  BUN 6 - 23 mg/dL 16 6(L) -  Creatinine 0.40 - 1.20 mg/dL 0.84 0.54 -  BUN/Creat Ratio 12 - 28 - - -  Sodium 135 - 145 mEq/L 134(L) 139 -  Potassium 3.5 - 5.1 mEq/L 4.2 3.7 3.8  Chloride 96 - 112 mEq/L 90(L) 97(L) -  CO2 19 - 32 mEq/L 33(H) 31 -  Calcium 8.4 - 10.5 mg/dL 10.1 8.5(L) -    BNP    Component Value Date/Time   BNP 79.4 06/01/2014 1203    ProBNP    Component Value Date/Time   PROBNP 50.3 12/21/2012 1514  PFT No results found for: FEV1PRE, FEV1POST, FVCPRE, FVCPOST, TLC, DLCOUNC, PREFEV1FVCRT, PSTFEV1FVCRT  CT ABDOMEN PELVIS WO CONTRAST  Result Date: 03/24/2019 CLINICAL DATA:  64 year old female with abdominal pain. EXAM: CT ABDOMEN AND PELVIS WITHOUT CONTRAST TECHNIQUE: Multidetector CT imaging of the abdomen and pelvis was performed following the standard protocol without IV contrast. COMPARISON:  Chest CT dated 03/22/2019. FINDINGS: Evaluation of this exam is limited in the absence of intravenous contrast. Lower chest: Partially visualized small bilateral pleural effusions, new since the study of 03/22/2019. Bilateral lower lobe partial consolidative change with air bronchograms may represent atelectasis or pneumonia. Clinical correlation and follow-up recommended. Additional patchy areas of nodular  and hazy density noted in the lingula and right middle lobe. There is no intra-abdominal free air or free fluid. Hepatobiliary: The liver is grossly unremarkable. Cholecystectomy. There is mild periportal edema. No retained calcified stone noted in the central CBD. Pancreas: Mildly atrophic pancreas. No active inflammatory changes or dilatation of the main pancreatic duct. Spleen: Normal in size without focal abnormality. Adrenals/Urinary Tract: The adrenal glands are unremarkable. There is no hydronephrosis or nephrolithiasis on either side. The visualized ureters and urinary bladder appear unremarkable. Stomach/Bowel: There is sigmoid diverticulosis. There is segmental thickening and inflammatory changes of the sigmoid colon which may represent acute diverticulitis versus mild segmental sigmoid colitis. Clinical correlation is recommended. No diverticular abscess or perforation. There is extension of the inflammatory process superiorly along the mesenteric vein with infiltration of the left retroperitoneal fat. If there is clinical concern for ischemia further evaluation with CT angiography is recommended. There is no bowel obstruction. The appendix is normal. Vascular/Lymphatic: Advanced aortoiliac atherosclerotic disease. The IVC is unremarkable. No portal venous gas. Several small mildly rounded retroperitoneal lymph nodes, likely reactive. No adenopathy. Reproductive: The uterus and ovaries are grossly unremarkable. Other: Mild subcutaneous edema. Musculoskeletal: No acute or significant osseous findings. IMPRESSION: 1. Sigmoid diverticulitis versus possible colitis. Clinical correlation is recommended. No drainable fluid collection abscess. No pneumatosis or portal venous gas. If there is clinical concern for an ischemic process, further evaluation with CT angiography recommended. 2. No bowel obstruction.  Normal appendix. 3. Small bilateral pleural effusions and bilateral atelectasis or infiltrate, new since  the prior CT. Clinical correlation and follow-up recommended. 4.  Aortic Atherosclerosis (ICD10-I70.0). Electronically Signed   By: Anner Crete M.D.   On: 03/24/2019 20:26   DG Chest 2 View  Result Date: 04/09/2019 CLINICAL DATA:  Follow-up pneumonia EXAM: CHEST - 2 VIEW COMPARISON:  03/27/2019, 03/25/2019, CT 03/21/2018 FINDINGS: Clearing of previously noted bilateral pleural effusions and basilar airspace disease. Faint focus of slightly nodular opacity in the right upper lobe. Other lung nodules by CT are not well seen radiographically. Normal heart size. No pneumothorax. IMPRESSION: 1. Interval resolution of small pleural effusions and basilar airspace disease. 2. Residual focus of nodularity and mild airspace disease in the right upper lobe, likely corresponding to the CT demonstrated possible pulmonary nodule for which CT follow-up was previously recommended, refer to 05/02/2019 CT chest report. Electronically Signed   By: Donavan Foil M.D.   On: 04/09/2019 15:53   DG Chest 2 View  Result Date: 03/27/2019 CLINICAL DATA:  Oxygen release EXAM: CHEST - 2 VIEW COMPARISON:  03/25/2019 FINDINGS: Unchanged examination with mild diffuse interstitial pulmonary opacity and small bilateral pleural effusions. IMPRESSION: Unchanged diffuse interstitial pulmonary opacity, likely edema, and small bilateral pleural effusions. Electronically Signed   By: Eddie Candle M.D.   On: 03/27/2019 15:55   DG Chest 2  View  Result Date: 03/25/2019 CLINICAL DATA:  Right-sided chest pain and shortness of breath for 5 days. EXAM: CHEST - 2 VIEW COMPARISON:  03/22/2019 FINDINGS: The cardiac silhouette, mediastinal and hilar contours are within normal limits. Increased interstitial markings and mild peribronchial thickening. There are small bilateral pleural effusions and overlying bibasilar atelectasis. Small pulmonary nodule seen on the recent chest CT are not well demonstrated on the x-ray. IMPRESSION: 1. Bronchitic type  changes suggesting bronchitis along with small bilateral pleural effusions and bibasilar atelectasis. 2. No focal pulmonary lesions. Small pulmonary nodule seen on CT scan are not well demonstrated. Electronically Signed   By: Marijo Sanes M.D.   On: 03/25/2019 11:01   CT Angio Chest PE W and/or Wo Contrast  Result Date: 03/22/2019 CLINICAL DATA:  Shortness of breath and right lower lobe pain for 4 days EXAM: CT ANGIOGRAPHY CHEST WITH CONTRAST TECHNIQUE: Multidetector CT imaging of the chest was performed using the standard protocol during bolus administration of intravenous contrast. Multiplanar CT image reconstructions and MIPs were obtained to evaluate the vascular anatomy. CONTRAST:  153mL OMNIPAQUE IOHEXOL 350 MG/ML SOLN COMPARISON:  None. FINDINGS: Cardiovascular: --Pulmonary arteries: Contrast injection is sufficient to demonstrate satisfactory opacification of the pulmonary arteries to the segmental level, with attenuation of at least 200 HU at the main pulmonary artery. There is no pulmonary embolus. The main pulmonary artery is within normal limits for size. --Aorta: Limited opacification of the aorta due to bolus timing optimization for the pulmonary arteries. The course and caliber of the aorta are normal. Conventional 3 vessel aortic branching pattern. There is mild aortic atherosclerosis. --Heart: Normal size. No pericardial effusion. Mediastinum/Nodes: No mediastinal, hilar or axillary lymphadenopathy. The visualized thyroid and thoracic esophageal course are unremarkable. Lungs/Pleura: Multiple nodular opacities in the right lung, largest of which is in the posterior right upper lobe measures approximately 1.5 cm. There is a 2 cm left parahilar nodule. No pleural effusion. Upper Abdomen: Contrast bolus timing is not optimized for evaluation of the abdominal organs. The visualized portions of the organs of the upper abdomen are normal. Musculoskeletal: No chest wall abnormality. No bony spinal  canal stenosis. Review of the MIP images confirms the above findings. IMPRESSION: 1. No pulmonary embolus to the segmental level. 2. Multiple nodular opacities in both lungs, the largest of which measures 2.0 cm. These are concerning for malignancy. Non-contrast chest CT at 3-6 months is recommended. If the nodules are stable at time of repeat CT, then future CT at 18-24 months (from today's scan) is considered optional for low-risk patients, but is recommended for high-risk patients. This recommendation follows the consensus statement: Guidelines for Management of Incidental Pulmonary Nodules Detected on CT Images: From the Fleischner Society 2017; Radiology 2017; 284:228-243. 3. Aortic Atherosclerosis (ICD10-I70.0). Electronically Signed   By: Ulyses Jarred M.D.   On: 03/22/2019 06:05   DG Chest Portable 1 View  Result Date: 03/22/2019 CLINICAL DATA:  64 year old female with shortness of breath. EXAM: PORTABLE CHEST 1 VIEW COMPARISON:  Chest radiograph dated 06/01/2014 FINDINGS: Minimal bibasilar interstitial nodularity may represent interstitial prominence. Atypical infiltrate is not excluded. Clinical correlation is recommended. No focal consolidation, pleural effusion, or pneumothorax. The cardiac silhouette is within normal limits. Coronary vascular calcification and atherosclerotic calcification of the aortic arch. No acute osseous pathology. IMPRESSION: 1. No focal consolidation. 2. Minimal bibasilar interstitial nodularity may represent interstitial prominence or atypical infection. Clinical correlation is recommended. Electronically Signed   By: Anner Crete M.D.   On: 03/22/2019 00:49  ECHOCARDIOGRAM COMPLETE  Result Date: 03/23/2019   ECHOCARDIOGRAM REPORT   Patient Name:   Christy Abbott Date of Exam: 03/22/2019 Medical Rec #:  [id number]        Height:       64.0 in Accession #:    [id number]       Weight:       155.0 lb Date of Birth:  [birth year]         BSA:          1.76 m Patient Age:     35 years         BP:           143/86 mmHg Patient Gender: F                HR:           100 bpm. Exam Location:  Inpatient Procedure: 2D Echo and Intracardiac Opacification Agent Indications:    Atrial Flutter 427.32/I48.92  History:        Patient has prior history of Echocardiogram examinations, most                 recent 12/22/2012. Risk Factors:Diabetes and Hypertension.  Sonographer:    Clayton Lefort RDCS (AE) Referring Phys: [id number] RONDELL A SMITH IMPRESSIONS  1. Definity contrast agent was given IV to delineate the left ventricular endocardial borders.  2. Left ventricular ejection fraction, by visual estimation, is 60 to 65%. The left ventricle has normal function. There is no left ventricular hypertrophy.  3. Left ventricular diastolic parameters are consistent with Grade I diastolic dysfunction (impaired relaxation).  4. The left ventricle has no regional wall motion abnormalities.  5. Global right ventricle has normal systolic function.The right ventricular size is normal.  6. Left atrial size was normal.  7. Right atrial size was normal.  8. The mitral valve is normal in structure. Trivial mitral valve regurgitation. No evidence of mitral stenosis.  9. The tricuspid valve is normal in structure. 10. The aortic valve was not well visualized. Aortic valve regurgitation is not visualized. 11. The pulmonic valve was not well visualized. Pulmonic valve regurgitation is not visualized. 12. Aortic dilatation noted. 13. There is mild dilatation of the aortic root measuring 40 mm. 14. The inferior vena cava is dilated in size with >50% respiratory variability, suggesting right atrial pressure of 8 mmHg. 15. Technically difficult; definity used; normal LV function; grade 1 diastolic dysfunction; mildly dilated aortic root. FINDINGS  Left Ventricle: Left ventricular ejection fraction, by visual estimation, is 60 to 65%. The left ventricle has normal function. Definity contrast agent was given IV to delineate the  left ventricular endocardial borders. The left ventricle has no regional wall motion abnormalities. There is no left ventricular hypertrophy. Left ventricular diastolic parameters are consistent with Grade I diastolic dysfunction (impaired relaxation). Normal left atrial pressure. Right Ventricle: The right ventricular size is normal. Global RV systolic function is has normal systolic function. Left Atrium: Left atrial size was normal in size. Right Atrium: Right atrial size was normal in size Pericardium: There is no evidence of pericardial effusion. Mitral Valve: The mitral valve is normal in structure. Trivial mitral valve regurgitation. No evidence of mitral valve stenosis by observation. Tricuspid Valve: The tricuspid valve is normal in structure. Tricuspid valve regurgitation is trivial. Aortic Valve: The aortic valve was not well visualized. Aortic valve regurgitation is not visualized. Pulmonic Valve: The pulmonic valve was not well visualized. Pulmonic valve regurgitation is not visualized. Pulmonic  regurgitation is not visualized. Aorta: Aortic dilatation noted. There is mild dilatation of the aortic root measuring 40 mm. Venous: The inferior vena cava is dilated in size with greater than 50% respiratory variability, suggesting right atrial pressure of 8 mmHg.  Additional Comments: Technically difficult; definity used; normal LV function; grade 1 diastolic dysfunction; mildly dilated aortic root.  LEFT VENTRICLE PLAX 2D LVIDd:         4.17 cm LVIDs:         2.95 cm LV PW:         0.98 cm LV IVS:        1.26 cm LVOT diam:     2.00 cm LV SV:         44 ml LV SV Index:   24.29 LVOT Area:     3.14 cm  RIGHT VENTRICLE             IVC RV S prime:     14.40 cm/s  IVC diam: 2.33 cm TAPSE (M-mode): 2.2 cm LEFT ATRIUM             Index       RIGHT ATRIUM           Index LA diam:        2.50 cm 1.42 cm/m  RA Area:     12.70 cm LA Vol (A2C):   40.6 ml 23.13 ml/m RA Volume:   32.90 ml  18.74 ml/m LA Vol (A4C):    15.0 ml 8.54 ml/m LA Biplane Vol: 27.0 ml 15.38 ml/m  AORTIC VALVE LVOT Vmax:   106.44 cm/s LVOT Vmean:  74.600 cm/s LVOT VTI:    0.196 m  AORTA Ao Root diam: 3.40 cm Ao Asc diam:  4.00 cm  SHUNTS Systemic VTI:  0.20 m Systemic Diam: 2.00 cm  Kirk Ruths MD Electronically signed by Kirk Ruths MD Signature Date/Time: 03/23/2019/7:16:15 AM    Final    US Abdomen Limited RUQ  Result Date: 03/23/2019 CLINICAL DATA:  Right upper quadrant pain EXAM: ULTRASOUND ABDOMEN LIMITED RIGHT UPPER QUADRANT COMPARISON:  None. FINDINGS: Gallbladder: Cholecystectomy. Common bile duct: Diameter: 7.7 mm. Liver: No focal lesion identified. Mildly increased parenchymal echogenicity. There is intrahepatic biliary dilatation. Portal vein is patent on color Doppler imaging with normal direction of blood flow towards the liver. Other: None. IMPRESSION: Mildly increased liver echogenicity likely reflecting steatosis. Mild intrahepatic and extrahepatic biliary dilatation probably related to post cholecystectomy state. Electronically Signed   By: Macy Mis M.D.   On: 03/23/2019 14:42     Past medical hx Past Medical History:  Diagnosis Date  . Atrial flutter [facility])    s/p CTI by Dr Lovena Le 02/2013  . Chest pain   . Diabetes mellitus without complication ([place])   . 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 Hemochromatosis associated with compound heterozygous mutation in HFE gene ([place]) 12/10/2017  . Hypertension   . Hyperthyroidism 10/31/2014     Social History   Tobacco Use  . Smoking status: Former Smoker    Packs/day: 1.00    Types: Cigarettes  . Smokeless tobacco: Never Used  Substance Use Topics  . Alcohol use: No    Alcohol/week: 0.0 standard drinks  . Drug use: No    Ms.Wohlford reports that she has quit smoking. Her smoking use included cigarettes. She smoked 1.00 pack per day. She has never used smokeless tobacco. She reports that she does not drink alcohol or use drugs.  Tobacco  Cessation: Pt. Has quit smoking.We have celebrated.  Past surgical hx, Family hx, Social hx all reviewed.  Current Outpatient Medications on File Prior to Visit  Medication Sig  . albuterol (VENTOLIN HFA) 108 (90 Base) MCG/ACT inhaler Inhale 2 puffs into the lungs every 6 (six) hours as needed for wheezing or shortness of breath.  Marland Kitchen Bioflavonoid Products (ESTER C PO) Take 1 tablet by mouth daily.  . Calcium Carb-Cholecalciferol (CALTRATE 600+D3 PO) Take 1 tablet by mouth daily.  . carvedilol (COREG) 12.5 MG tablet TAKE 1 TABLET BY MOUTH TWICE DAILY ( PATIENT MUST KEEP UPCOMING APPOINTMENT )  . cephALEXin (KEFLEX) 500 MG capsule Take 1 capsule (500 mg total) by mouth 3 (three) times daily for 14 days.  . flecainide (TAMBOCOR) 50 MG tablet TAKE 1 TABLET BY MOUTH TWICE DAILY, MAY TAKE AN ADDITIONAL 1 TABLET AS NEEDED. Please keep upcoming appt in January. Thank you  . furosemide (LASIX) 20 MG tablet Take 1 tablet (20 mg total) by mouth daily as needed.  Marland Kitchen glimepiride (AMARYL) 4 MG tablet Take 1 tablet (4 mg total) by mouth daily with breakfast. TAKE 1 TABLET BY MOUTH ONCE DAILY BEFORE BREAKFAST  . guaiFENesin-dextromethorphan (ROBITUSSIN DM) 100-10 MG/5ML syrup Take 5 mLs by mouth every 4 (four) hours as needed for cough.  . insulin glargine (LANTUS) 100 unit/mL SOPN Inject 0.1 mLs (10 Units total) into the skin daily.  . Insulin Pen Needle (B-D UF III MINI PEN NEEDLES) 31G X 5 MM MISC Use with insulin pen  . Lancets 30G MISC Check sugar twice daily.  Marland Kitchen lisinopril-hydrochlorothiazide (PRINZIDE,ZESTORETIC) 20-25 MG tablet Take 1 tablet by mouth daily.  . metFORMIN (GLUCOPHAGE) 500 MG tablet Take 2 tablets (1,000 mg total) by mouth 2 (two) times daily.  . methimazole (TAPAZOLE) 10 MG tablet Take 1.5 tablets (15 mg total) by mouth daily.  . Omega-3 Fatty Acids (FISH OIL) 1200 MG CAPS Take 1,200 mg by mouth daily.   . potassium chloride SA (KLOR-CON) 20 MEQ tablet TAKE 1  BY MOUTH ONCE DAILY  .  senna (SENOKOT) 8.6 MG tablet Take 1 tablet by mouth as needed for constipation.  . simvastatin (ZOCOR) 10 MG tablet TAKE 1 TABLET BY MOUTH AT BEDTIME   No current facility-administered medications on file prior to visit.     Allergies  Allergen Reactions  . Jardiance [Empagliflozin]     Causes heart palpitations  . Trazodone And Nefazodone     "hyped me up, could not sleep"    Review Of Systems:  Constitutional:   No  weight loss, night sweats,  Fevers, chills, fatigue, or  lassitude.  HEENT:   No headaches,  Difficulty swallowing,  Tooth/dental problems, or  Sore throat,                No sneezing, itching, ear ache, nasal congestion, post nasal drip,   CV:  No chest pain,  Orthopnea, PND, swelling in lower extremities, anasarca, dizziness, palpitations, syncope.   GI  No heartburn, indigestion, abdominal pain, nausea, vomiting, diarrhea, change in bowel habits, loss of appetite, bloody stools.   Resp: No shortness of breath with exertion or at rest.  No excess mucus, no productive cough,  No non-productive cough,  No coughing up of blood.  No change in color of mucus.  No wheezing.  No chest wall deformity  Skin: no rash or lesions.  GU: no dysuria, change in color of urine, no urgency or frequency.  No flank pain, no hematuria  MS:  No joint pain or swelling.  No decreased range of motion.  No back pain.  Psych:  No change in mood or affect. No depression or anxiety.  No memory loss.   Vital Signs BP (!) 96/52 (BP Location: Left Arm, Cuff Size: Normal)   Pulse 76   Temp (!) 97.4 F (36.3 C) (Temporal)   Ht 5\' 4"  (1.626 m)   Wt 140 lb 12.8 oz (63.9 kg)   SpO2 96%   BMI 24.17 kg/m    Physical Exam:  General- No distress,  A&Ox3, pleasant and appropriate ENT: No sinus tenderness, TM clear, pale nasal mucosa, no oral exudate,no post nasal drip, no LAN Cardiac: S1, S2, regular rate and rhythm, no murmur Chest: Bilateral chest excursion, No wheeze/ rales/ dullness;  no accessory muscle use, no nasal flaring, no sternal retractions Abd.: Soft Non-tender, ND, BS +, Body mass index is 24.17 kg/m. Ext: No clubbing cyanosis, edema Neuro:  normal strength, MAE x 4, A&O x 3, appropriate Skin: No rashes, No lesions, warm and dry Psych: normal mood and behavior   Assessment/Plan  Acute on chronic respiratory failure with hypoxemia (HCC)  Acute on chronic hypoxemia- acute related to volume overloaded state of heart, chronic related to likely undiagnosed emphysema, possible undiagnosed  shunt , DLCO would be helpful Resolved with antibiotics and diuresis Saturations are > 95% on RA with exertion and at rest, she no longer needs home oxygen Plan' Continue Keflex until gone Follow up PET scan the week of 04/28/2019 Follow up with Dr. Tamala Julian 04/29/2019 as is scheduled Saturation goals are > 92% Needs PFT's   Abnormal CT of the chest Pulmonary Nodules many nodules look cavitary/infectious; however the left hilar nodule is large and different from the others,suspicious she may have a cancer hiding in all this.  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 Plan Plan for a  long course of antibiotics (3 weeks) for E coli septic emboli in lungs and bacteremia (cefdinir is good option). -We will do a PET scan around 4 weeks ( 04/2019) to determine need for bronchoscopy. - Follow up with Dr. Tamala Julian after PET 05/09/2019 as is scheduled  Sepsis ([place]) E Coli Sepsis 2/2 diverticulitis BP remains soft most likely 2/2 diuresis Resolved  Plan Completed Antibiotic regimen as inpatient Completed Flagyl x additional 5 days at discharge Lasix changed from scheduled to prn per cards  Tobacco use disorder States she has quit smoking since discharge from the hospital We celebrated Plan Continued support of smoking cessation efforts Needs PFT's to evaluate for COPD and maintenance regimen If PET is negative  for suspicion of bronchogenic cancer, will need to enter the lung cancer screening population.   This appointment was 40 min long with over 50% of the time in direct face-to-face patient care, assessment, plan of care, and follow-up.   Magdalen Spatz, NP 04/09/2019  5:12 PM

## 2019-04-09 ENCOUNTER — Encounter: Payer: Self-pay | Admitting: Acute Care

## 2019-04-09 ENCOUNTER — Ambulatory Visit (INDEPENDENT_AMBULATORY_CARE_PROVIDER_SITE_OTHER): Payer: 59

## 2019-04-09 ENCOUNTER — Other Ambulatory Visit: Payer: Self-pay | Admitting: Family

## 2019-04-09 ENCOUNTER — Ambulatory Visit (INDEPENDENT_AMBULATORY_CARE_PROVIDER_SITE_OTHER): Payer: 59 | Admitting: Acute Care

## 2019-04-09 VITALS — BP 96/52 | HR 76 | Temp 97.4°F | Ht 64.0 in | Wt 140.8 lb

## 2019-04-09 DIAGNOSIS — J9621 Acute and chronic respiratory failure with hypoxia: Secondary | ICD-10-CM

## 2019-04-09 DIAGNOSIS — F17200 Nicotine dependence, unspecified, uncomplicated: Secondary | ICD-10-CM

## 2019-04-09 DIAGNOSIS — R9389 Abnormal findings on diagnostic imaging of other specified body structures: Secondary | ICD-10-CM | POA: Insufficient documentation

## 2019-04-09 DIAGNOSIS — F17210 Nicotine dependence, cigarettes, uncomplicated: Secondary | ICD-10-CM | POA: Diagnosis not present

## 2019-04-09 DIAGNOSIS — J181 Lobar pneumonia, unspecified organism: Secondary | ICD-10-CM | POA: Diagnosis not present

## 2019-04-09 DIAGNOSIS — A4151 Sepsis due to Escherichia coli [E. coli]: Secondary | ICD-10-CM

## 2019-04-09 DIAGNOSIS — R911 Solitary pulmonary nodule: Secondary | ICD-10-CM | POA: Diagnosis not present

## 2019-04-09 HISTORY — DX: Acute and chronic respiratory failure with hypoxia: J96.21

## 2019-04-09 IMAGING — DX DG CHEST 2V
2 series · 2 of 2 positions shown · non-contrast
Comparison: [DATE], [DATE], CT [DATE]

CLINICAL DATA: Follow-up pneumonia

EXAM:
CHEST - 2 VIEW

[chest pa]
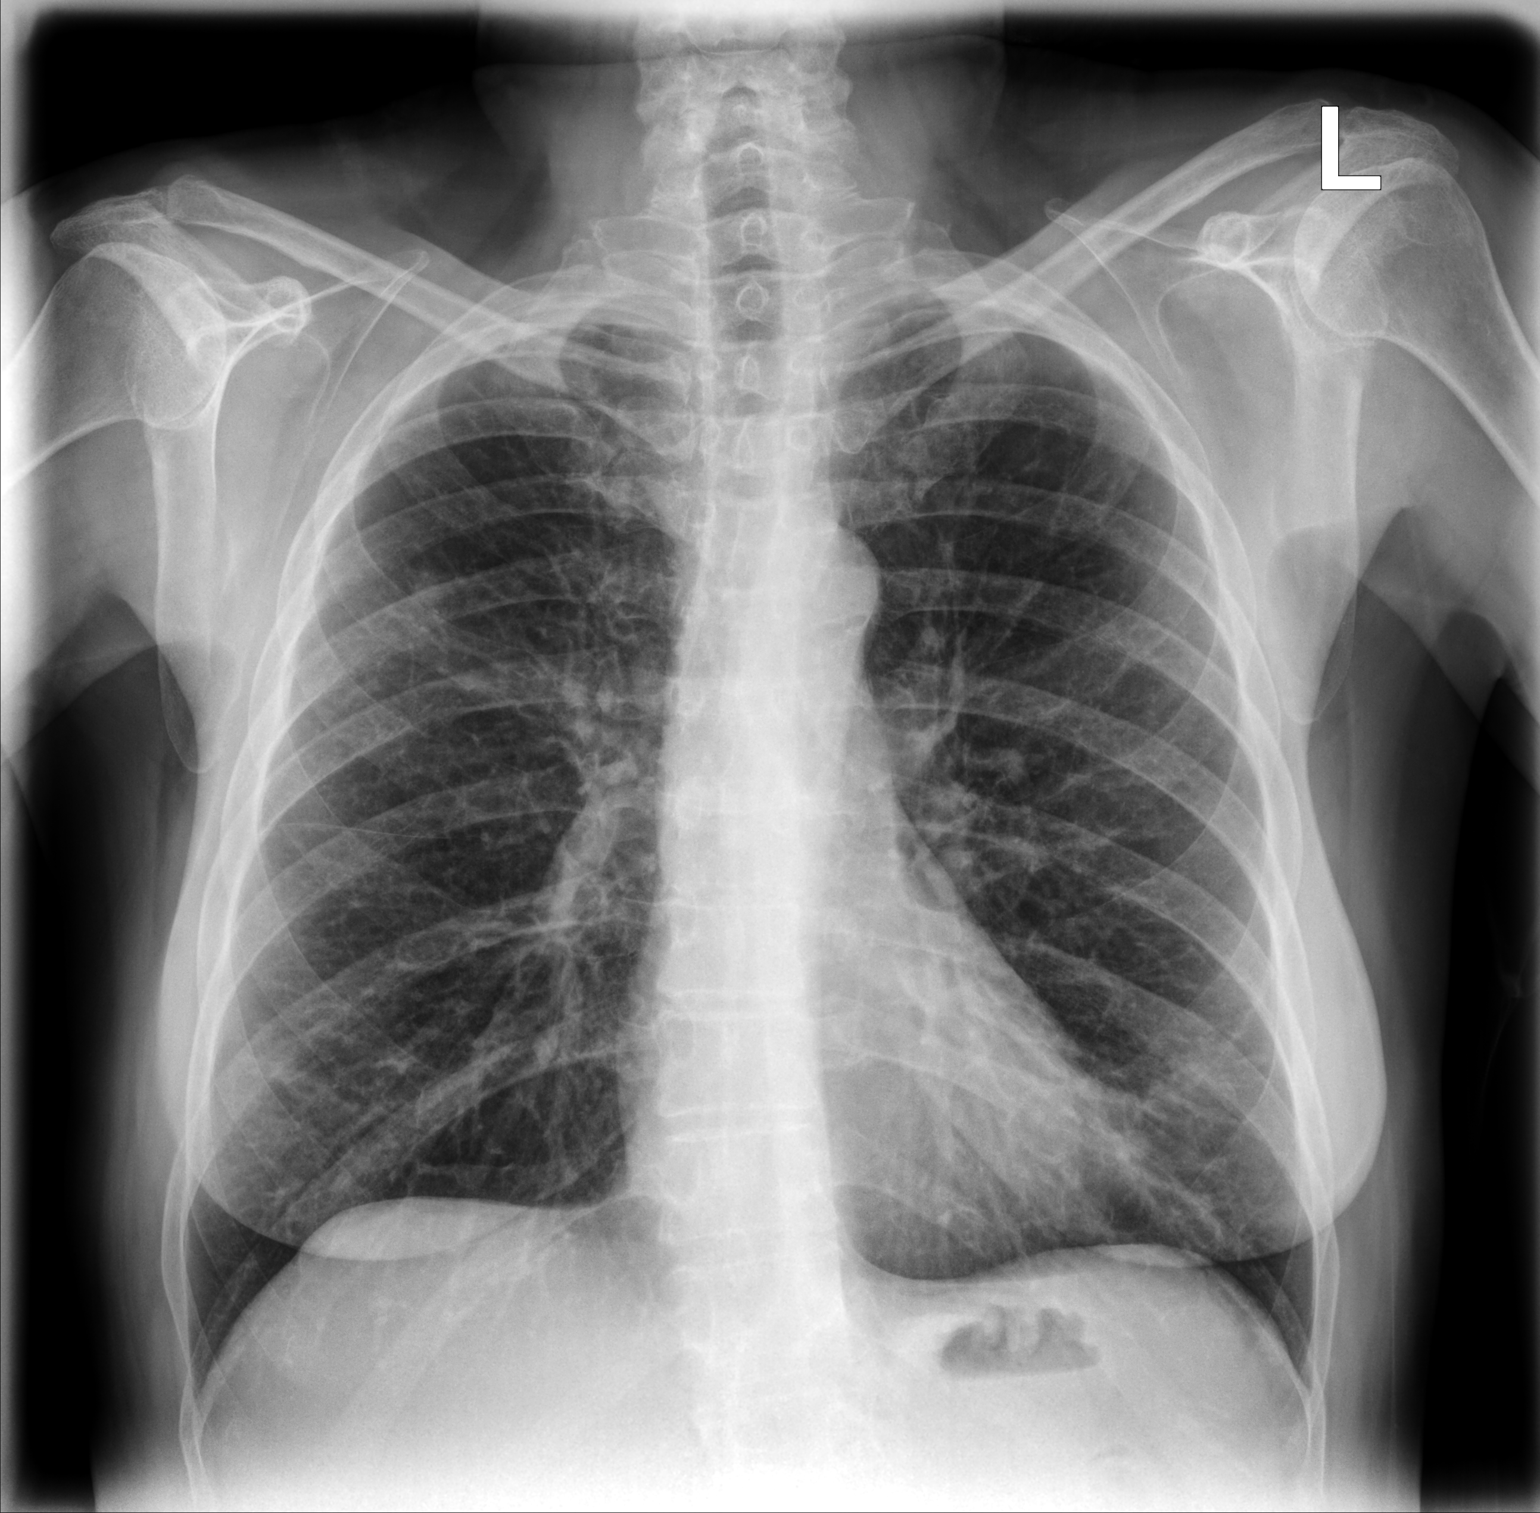

[chest lat]
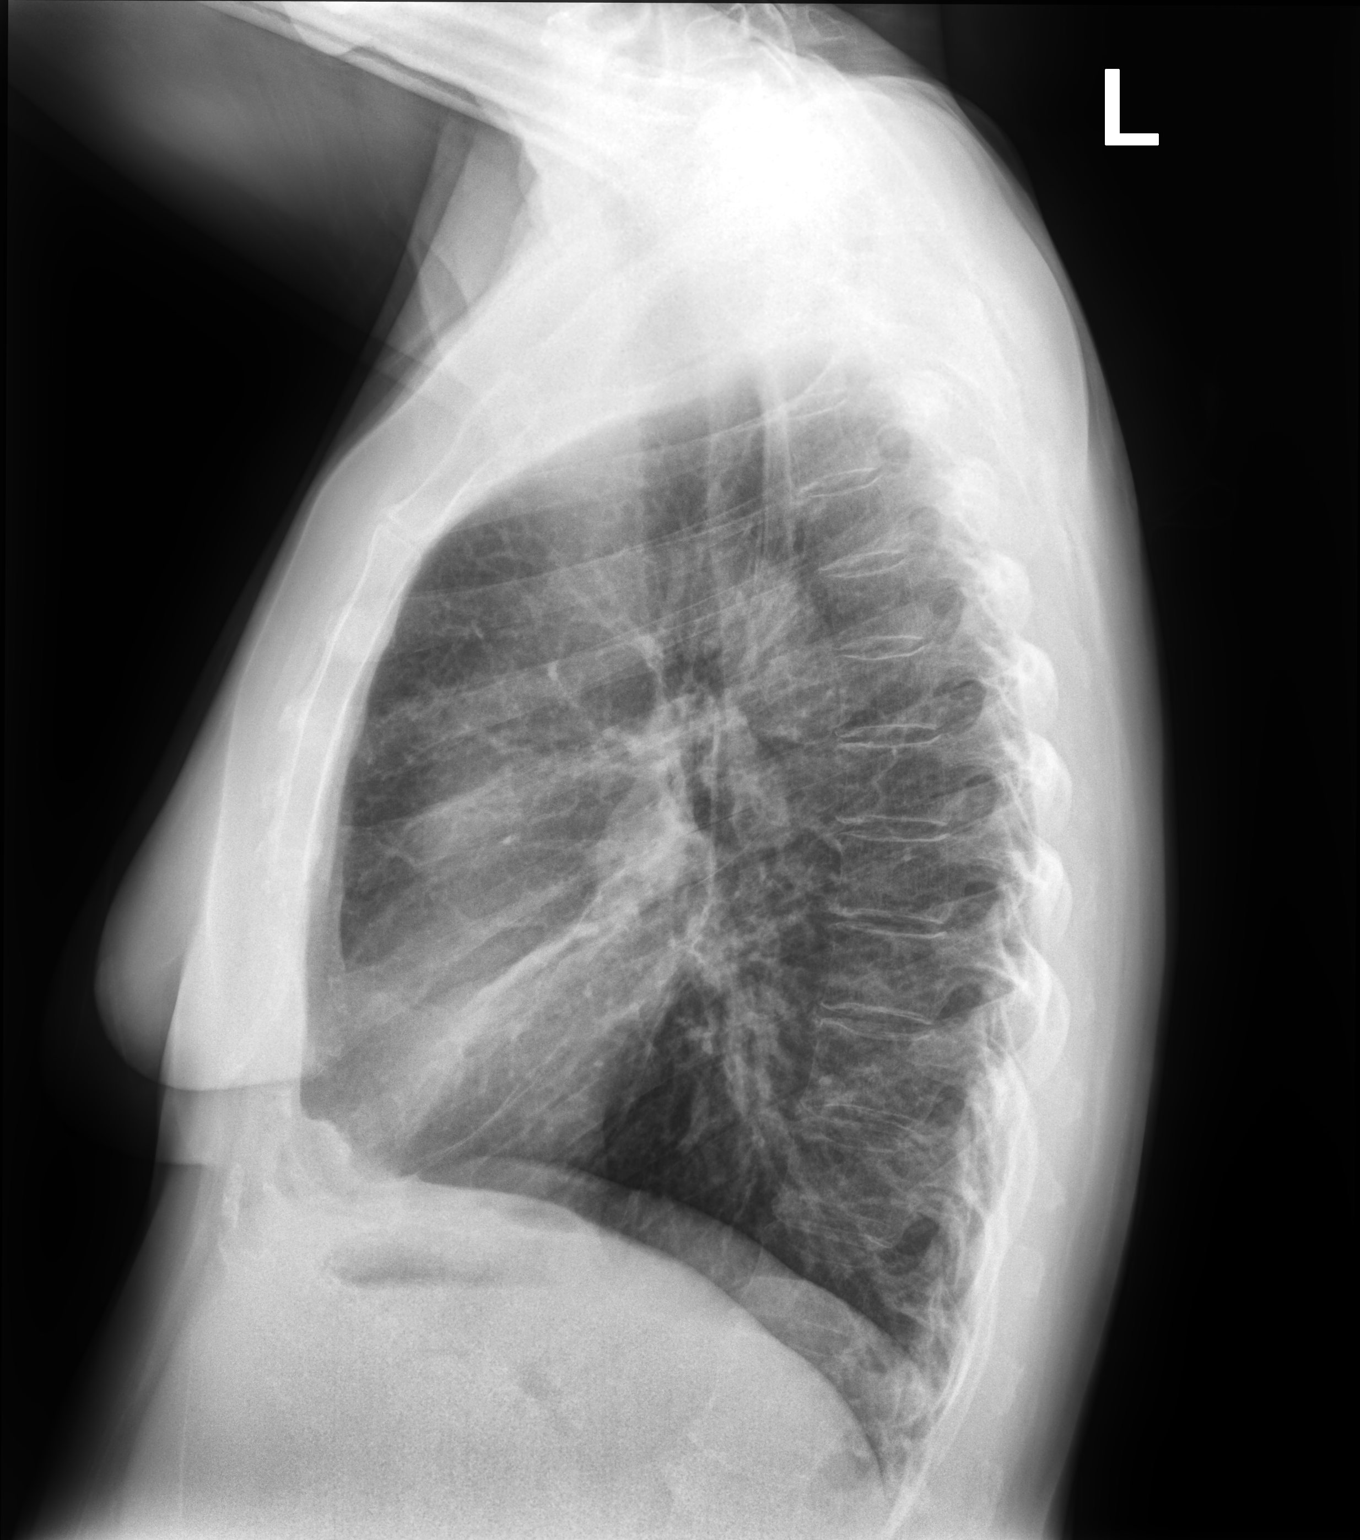

[2 of 2 positions shown; findings below may reference images not displayed]

FINDINGS: Clearing of previously noted bilateral pleural effusions and basilar
airspace disease. Faint focus of slightly nodular opacity in the
right upper lobe. Other lung nodules by CT are not well seen
radiographically. Normal heart size. No pneumothorax.
IMPRESSION: 1. Interval resolution of small pleural effusions and basilar
airspace disease.
2. Residual focus of nodularity and mild airspace disease in the
right upper lobe, likely corresponding to the CT demonstrated
possible pulmonary nodule for which CT follow-up was previously
recommended, refer to [DATE] CT chest report.

## 2019-04-09 NOTE — Assessment & Plan Note (Signed)
  Acute on chronic hypoxemia- acute related to volume overloaded state of heart, chronic related to likely undiagnosed emphysema, possible undiagnosed  shunt , DLCO would be helpful Resolved with antibiotics and diuresis Saturations are > 95% on RA with exertion and at rest, she no longer needs home oxygen Plan' Continue Keflex until gone Follow up PET scan the week of 04/28/2019 Follow up with Dr. Tamala Julian 04/29/2019 as is scheduled Saturation goals are > 92% Needs PFT's

## 2019-04-09 NOTE — Assessment & Plan Note (Signed)
E Coli Sepsis 2/2 diverticulitis BP remains soft most likely 2/2 diuresis Resolved  Plan Completed Antibiotic regimen as inpatient Completed Flagyl x additional 5 days at discharge Lasix changed from scheduled to prn per cards

## 2019-04-09 NOTE — Patient Instructions (Addendum)
It is good to see you today. Congratulations on quitting smoking That is amazing!! Finish your antibiotics as prescribed We will order a CXR today to ensure you are improving We will order a PET scan to better evaluate the pulmonary nodule noted on CT Chest while you were in the hospital second week February ( Must be before 2/16 ) We will order PFT's . Follow up with Dr. Tamala Julian 04/29/2019 at 2:30. Continue to increase your activity slowly as able. Please contact office for sooner follow up if symptoms do not improve or worsen or seek emergency care

## 2019-04-09 NOTE — Assessment & Plan Note (Addendum)
States she has quit smoking since discharge from the hospital We celebrated Plan Continued support of smoking cessation efforts Needs PFT's to evaluate for COPD and maintenance regimen If PET is negative for suspicion of bronchogenic cancer, will need to enter the lung cancer screening population.

## 2019-04-09 NOTE — Assessment & Plan Note (Signed)
Pulmonary Nodules many nodules look cavitary/infectious; however the left hilar nodule is large and different from the others,suspicious she may have a cancer hiding in all this.  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 Plan Plan for a  long course of antibiotics (3 weeks) for E coli septic emboli in lungs and bacteremia (cefdinir is good option). -We will do a PET scan around 4 weeks ( 04/2019) to determine need for bronchoscopy. - Follow up with Dr. Tamala Julian after PET 05/09/2019 as is scheduled

## 2019-04-10 NOTE — Progress Notes (Signed)
Please let patient know her CXR shows resolution of her small pleural effusions, which is great news. There was again notation of the nodularity in the right upper lung that we are going to further evaluate with the PET scan that we discussed 04/09/2019 at the [facility]. This scan is scheduled for 04/22/2019. Then you will follow up with Dr. Tamala Julian to review the scan 04/29/2019. Have her call if she has any additional questions. Thanks so much

## 2019-04-10 NOTE — Progress Notes (Signed)
Contacted patient and made aware of results of cxr and plan going forward. Pt verbalized understanding and had no further question. Nothing further needed.

## 2019-04-10 NOTE — Telephone Encounter (Signed)
Advised patient of needing additional labs, was scheduled to come in tomorrow afternoon.

## 2019-04-11 ENCOUNTER — Other Ambulatory Visit: Payer: Self-pay

## 2019-04-11 ENCOUNTER — Other Ambulatory Visit (INDEPENDENT_AMBULATORY_CARE_PROVIDER_SITE_OTHER): Payer: 59

## 2019-04-11 LAB — FERRITIN: Ferritin: 669 ng/mL — ABNORMAL HIGH (ref 16–288)

## 2019-04-11 NOTE — Addendum Note (Signed)
Addended by: Caffie Pinto on: 04/11/2019 02:52 PM   Modules accepted: Orders

## 2019-04-14 ENCOUNTER — Telehealth: Payer: Self-pay | Admitting: Family

## 2019-04-14 NOTE — Telephone Encounter (Signed)
Her iron level is much too high. If she is taking any vitamins with iron or iron supplements she should stop. I would like to get her in with hematology for consult.  I have placed the referral.

## 2019-04-16 NOTE — Telephone Encounter (Signed)
Patient reported she is not taking any supplements at this time.  Patient advised of referral.

## 2019-04-16 NOTE — Telephone Encounter (Signed)
Referral has been placed. 

## 2019-04-21 ENCOUNTER — Encounter: Payer: Self-pay | Admitting: Family

## 2019-04-21 ENCOUNTER — Other Ambulatory Visit: Payer: Self-pay

## 2019-04-21 ENCOUNTER — Ambulatory Visit (INDEPENDENT_AMBULATORY_CARE_PROVIDER_SITE_OTHER): Payer: 59 | Admitting: Family

## 2019-04-21 VITALS — BP 138/82 | HR 69 | Ht 64.0 in | Wt 143.0 lb

## 2019-04-21 DIAGNOSIS — G4700 Insomnia, unspecified: Secondary | ICD-10-CM

## 2019-04-21 NOTE — Progress Notes (Signed)
Virtual Visit via Video Note  I connected with Christy Abbott on 04/21/19 at 12:40 PM EST by a video enabled telemedicine application and verified that I am speaking with the correct person using two identifiers.  Location: Patient: home Provider: home   I discussed the limitations of evaluation and management by telemedicine and the availability of in person appointments. The patient expressed understanding and agreed to proceed.  History of Present Illness:  64 yr old female who presents today to discuss insomnia.  Reports that she is not sleeping well.  She has been taking tylenol PM. Reports that it relaxes her enough so she can go to sleep, but then tosses and turns all night.  She has also tried melatonin.   Reports that she is having trouble finding tylenol PM or tylenol.  Has tried trazodone previously without improvement. Denies evening screen use or caffeine consumption in the evenings or afternoon.    Past Medical History:  Diagnosis Date  . Atrial flutter [facility])    s/p CTI by Dr Lovena Le 02/2013  . Chest pain   . Diabetes mellitus without complication ([place])   . 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 Hemochromatosis associated with compound heterozygous mutation in HFE gene ([place]) 12/10/2017  . Hypertension   . Hyperthyroidism 10/31/2014     Social History   Socioeconomic History  . Marital status: Married    Spouse name: Not on file  . Number of children: Not on file  . Years of education: Not on file  . Highest education level: Not on file  Occupational History  . Not on file  Tobacco Use  . Smoking status: Former Smoker    Packs/day: 1.00    Types: Cigarettes    Quit date: 03/21/2019    Years since quitting: 0.0  . Smokeless tobacco: Never Used  Substance and Sexual Activity  . Alcohol use: No    Alcohol/week: 0.0 standard drinks  . Drug use: No  . Sexual activity: Not on file  Other Topics Concern  . Not on file  Social History Narrative    Married   Clinical cytogeneticist- full time   Daughter- 2 grandchildren, live in [place]   Enjoys playing on computer.     Social Determinants of Health   Financial Resource Strain:   . Difficulty of Paying Living Expenses: Not on file  Food Insecurity:   . Worried About Charity fundraiser in the Last Year: Not on file  . Ran Out of Food in the Last Year: Not on file  Transportation Needs:   . Lack of Transportation (Medical): Not on file  . Lack of Transportation (Non-Medical): Not on file  Physical Activity:   . Days of Exercise per Week: Not on file  . Minutes of Exercise per Session: Not on file  Stress:   . Feeling of Stress : Not on file  Social Connections:   . Frequency of Communication with Friends and Family: Not on file  . Frequency of Social Gatherings with Friends and Family: Not on file  . Attends Religious Services: Not on file  . Active Member of Clubs or Organizations: Not on file  . Attends Archivist Meetings: Not on file  . Marital Status: Not on file  Intimate Partner Violence:   . Fear of Current or Ex-Partner: Not on file  . Emotionally Abused: Not on file  . Physically Abused: Not on file  . Sexually Abused: Not on file  Past Surgical History:  Procedure Laterality Date  . ABLATION  03-04-2013   CTI by Dr Lovena Le for atrial flutter  . ATRIAL FLUTTER ABLATION N/A 03/04/2013   Procedure: ATRIAL FLUTTER ABLATION;  Surgeon: Evans Lance, MD;  Location: [facility] CATH LAB;  Service: Cardiovascular;  Laterality: N/A;  . CHOLECYSTECTOMY  1982  . ESOPHAGOGASTRODUODENOSCOPY N/A 05/20/2014   Procedure: ESOPHAGOGASTRODUODENOSCOPY (EGD);  Surgeon: Teena Irani, MD;  Location: [facility] END[facility];  Service: Endoscopy;  Laterality: N/A;  . TONSILLECTOMY  1972 ?    Family History  Problem Relation Age of Onset  . Atrial fibrillation Mother   . Arrhythmia Mother   . Diabetes Mother   . Hodgkin's lymphoma Father     Allergies  Allergen Reactions  . Jardiance  [Empagliflozin]     Causes heart palpitations  . Trazodone And Nefazodone     "hyped me up, could not sleep"    Current Outpatient Medications on File Prior to Visit  Medication Sig Dispense Refill  . albuterol (VENTOLIN HFA) 108 (90 Base) MCG/ACT inhaler Inhale 2 puffs into the lungs every 6 (six) hours as needed for wheezing or shortness of breath. 6.7 g 0  . Bioflavonoid Products (ESTER C PO) Take 1 tablet by mouth daily.    . Calcium Carb-Cholecalciferol (CALTRATE 600+D3 PO) Take 1 tablet by mouth daily.    . carvedilol (COREG) 12.5 MG tablet TAKE 1 TABLET BY MOUTH TWICE DAILY ( PATIENT MUST KEEP UPCOMING APPOINTMENT ) 180 tablet 3  . flecainide (TAMBOCOR) 50 MG tablet TAKE 1 TABLET BY MOUTH TWICE DAILY, MAY TAKE AN ADDITIONAL 1 TABLET AS NEEDED. Please keep upcoming appt in January. Thank you 270 tablet 0  . furosemide (LASIX) 20 MG tablet Take 1 tablet (20 mg total) by mouth daily as needed. 60 tablet 0  . glimepiride (AMARYL) 4 MG tablet Take 1 tablet (4 mg total) by mouth daily with breakfast. TAKE 1 TABLET BY MOUTH ONCE DAILY BEFORE BREAKFAST    . guaiFENesin-dextromethorphan (ROBITUSSIN DM) 100-10 MG/5ML syrup Take 5 mLs by mouth every 4 (four) hours as needed for cough. 118 mL 0  . insulin glargine (LANTUS) 100 unit/mL SOPN Inject 0.1 mLs (10 Units total) into the skin daily. 15 mL 2  . Insulin Pen Needle (B-D UF III MINI PEN NEEDLES) 31G X 5 MM MISC Use with insulin pen 100 each 3  . Lancets 30G MISC Check sugar twice daily. 100 each 3  . lisinopril-hydrochlorothiazide (PRINZIDE,ZESTORETIC) 20-25 MG tablet Take 1 tablet by mouth daily. 90 tablet 1  . metFORMIN (GLUCOPHAGE) 500 MG tablet Take 2 tablets (1,000 mg total) by mouth 2 (two) times daily. 120 tablet 1  . methimazole (TAPAZOLE) 10 MG tablet TAKE 1 & 1/2 (ONE & ONE-HALF) TABLETS BY MOUTH ONCE DAILY 45 tablet 0  . Omega-3 Fatty Acids (FISH OIL) 1200 MG CAPS Take 1,200 mg by mouth daily.     . potassium chloride SA (KLOR-CON)  20 MEQ tablet TAKE 1  BY MOUTH ONCE DAILY 90 tablet 0  . senna (SENOKOT) 8.6 MG tablet Take 1 tablet by mouth as needed for constipation.    . simvastatin (ZOCOR) 10 MG tablet TAKE 1 TABLET BY MOUTH AT BEDTIME 90 tablet 0   No current facility-administered medications on file prior to visit.    BP 138/82 (BP Location: Left Arm, Patient Position: Sitting)   Pulse 69   Ht 5\' 4"  (1.626 m)   Wt 143 lb (64.9 kg)   BMI 24.55 kg/m  Observations/Objective:   Gen: Awake, alert, no acute distress Resp: Breathing is even and non-labored Psych: calm/pleasant demeanor Neuro: Alert and Oriented x 3, + facial symmetry, speech is clear.   Assessment and Plan:  Insomnia- wishes to avoid controlled substances like benzos or ambien.  Recommended that she purchase some otc benadryl and use benadryl 25mg  at bedtime as needed. She will let me know if her symptoms worsen or if they fail to improve.  Follow Up Instructions:    I discussed the assessment and treatment plan with the patient. The patient was provided an opportunity to ask questions and all were answered. The patient agreed with the plan and demonstrated an understanding of the instructions.   The patient was advised to call back or seek an in-person evaluation if the symptoms worsen or if the condition fails to improve as anticipated.  Nance Pear, NP

## 2019-04-22 ENCOUNTER — Other Ambulatory Visit: Payer: Self-pay

## 2019-04-22 ENCOUNTER — Ambulatory Visit (HOSPITAL_COMMUNITY)
Admission: RE | Admit: 2019-04-22 | Discharge: 2019-04-22 | Disposition: A | Discharge status: Home / Self Care | Payer: 59 | Source: Ambulatory Visit | Attending: Acute Care | Admitting: Acute Care

## 2019-04-22 DIAGNOSIS — R911 Solitary pulmonary nodule: Secondary | ICD-10-CM | POA: Diagnosis present

## 2019-04-22 LAB — GLUCOSE, CAPILLARY: Glucose-Capillary: 145 mg/dL — ABNORMAL HIGH (ref 70–99)

## 2019-04-22 IMAGING — PT NM PET TUM IMG INITIAL (PI) SKULL BASE T - THIGH
1 of 7 series · 1 of 25 positions shown · non-contrast
Comparison: CTA chest dated [DATE]

CLINICAL DATA: Initial treatment strategy for multiple pulmonary
nodules.

EXAM:
NUCLEAR MEDICINE PET SKULL BASE TO THIGH
TECHNIQUE: 7.1 mCi F-18 FDG was injected intravenously. Full-ring PET imaging
was performed from the skull base to thigh after the radiotracer. CT
data was obtained and used for attenuation correction and anatomic
localization.
Fasting blood glucose: 145 mg/dl

[Series 4: ct sk_thigh 5.0 b31f · axial · 5.0mm · 0.98mm/px · 1 of 216 slices shown]
[im 216/216  brain]
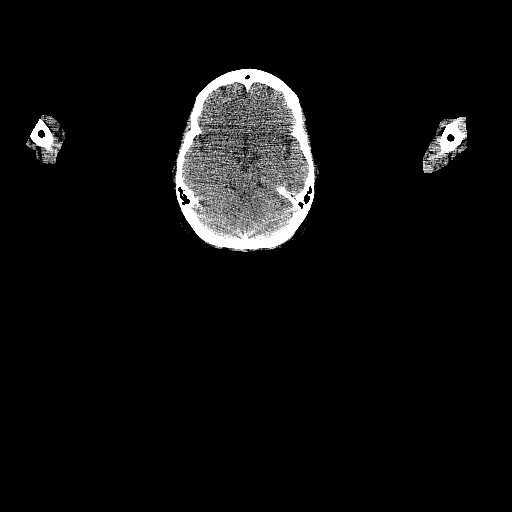

[1 of 25 positions shown; findings below may reference images not displayed]

FINDINGS: Mediastinal blood pool activity: SUV max

Liver activity: SUV max NA

NECK: No hypermetabolic cervical lymphadenopathy.

Incidental CT findings: none

CHEST: 2.5 cm central left upper lobe/perihilar nodule (series
8/image 33), previously 2.1 cm, max SUV 7.5. This is suspicious for
primary bronchogenic neoplasm.

12 mm nodular opacity in the posterior right upper lobe (series
8/image 20), previously 15 mm, max SUV 1.4. 7 mm subpleural nodule
in the posterior right lower lobe (series 8/image 29) and additional
10 mm bilobed subpleural nodule in the posterior right lower lobe
(series 8/image 39), previously 8 and 10 mm respectively, max SUV
2.7. 7 mm posterior left lower lobe nodule (series 8/image 47),
previously 12 mm. Additional scattered small bilateral pulmonary
nodules. These findings are indeterminate but favor multifocal
infection.

Lingular atelectasis, mildly progressive. Mild patchy opacity in the
medial left upper lobe (series 8/image 29), unchanged.

No pleural effusion or pneumothorax.

No hypermetabolic thoracic lymphadenopathy.

Incidental CT findings: Atherosclerotic calcifications of the aortic
arch. Mild coronary atherosclerosis of the LAD.

ABDOMEN/PELVIS: No hypermetabolic abdominopelvic lymphadenopathy.

No abnormal hypermetabolism in the liver, spleen, pancreas, or
adrenal glands.

Incidental CT findings: Status post cholecystectomy. Atherosclerotic
calcifications the abdominal aorta and branch vessels.

SKELETON: No focal hypermetabolic activity to suggest skeletal
metastasis.

Incidental CT findings: Degenerative changes of the visualized
thoracolumbar spine.
IMPRESSION: 2.5 cm central left upper lobe/perihilar nodule, mildly progressive,
suspicious for primary bronchogenic neoplasm.

Scattered small bilateral pulmonary nodules, most of which are
mildly improved, favoring mild multifocal infection.

No evidence of metastatic disease in the abdomen/pelvis.

## 2019-04-22 MED ORDER — FLUDEOXYGLUCOSE F - 18 (FDG) INJECTION
7.1000 | Freq: Once | INTRAVENOUS | Status: AC | PRN
  Administered 2019-04-22: 10:00:00 7.1 via INTRAVENOUS

## 2019-04-23 ENCOUNTER — Telehealth: Payer: Self-pay | Admitting: Acute Care

## 2019-04-23 NOTE — Telephone Encounter (Signed)
I have called the patient with the results of her PET scan.  I explained that the nodule we have been watching in her left upper lobe that had previously measured 2.1 cm and now measures 2.5 cm is suspicious for bronchogenic cancer.  I told her that the plan at this point is for [place] to biopsy that nodule most likely through bronchoscopy.  She does have an appointment with Dr. Ina Homes 04/29/2019 to discuss options for tissue sampling.  I explained that there is no evidence of metastatic disease into the pelvis or abdomen which is good news.  She verbalized understanding of the above, and had no further questions upon completion of the call.  I did explain to her that if she needs [place] prior to her appointment on the 16th to give [place] a call.

## 2019-04-23 NOTE — Telephone Encounter (Signed)
Called and spoke with pt. Pt is requesting to know the results of her PET which was performed 2/9. Sarah, please advise on this for pt. Thanks!

## 2019-04-24 NOTE — Telephone Encounter (Signed)
I called and left her a voicemail, will route a message to [place] triage. I think best thing is just get her scheduled for bronch next week- will work with scheduling to see if Tuesday endo has spot otherwise can do add on later in the week.  Thanks for calling her Judson Roch! Linna Hoff

## 2019-04-25 ENCOUNTER — Telehealth: Payer: Self-pay | Admitting: Internal Medicine

## 2019-04-25 NOTE — Telephone Encounter (Signed)
No need for f/u with me on Tuesday  Thanks

## 2019-04-25 NOTE — Telephone Encounter (Signed)
Called OR and spoke with Maudie Mercury. Pt has been scheduled for bronch with ebus Wednesday in OR Room 2 w/ Dr. Tamala Julian. Start time is 8:30  Case ID: [id number]  Pt will need to be covid tested 3 days prior to procedure. Pt has been scheduled for covid test 2/13.  Called and spoke with pt letting her know this information.  Dr. Tamala Julian, I see that pt is currently scheduled for a f/u with you 2/16 to go over the results of the PET. Do you still want her to keep that appt with you or reschedule this appt after the bronch?

## 2019-04-25 NOTE — Telephone Encounter (Signed)
Katie with [facility] called to give [place] numbers for both Endo offices  [facility] [phone] [facility] - [phone]  Joellen Jersey states that if you have any problems scheduling call her back at [facility] - [phone]

## 2019-04-25 NOTE — Telephone Encounter (Addendum)
Candee Furbish, MD  P [facility] Triage Pool  Cc: Magdalen Spatz, NP; Garner Nash, DO; Lamonte Sakai Rose Fillers, MD; Valrie Hart, RN  Hi,   Ms Herskowitz needs a bronch for early-ish stage lung cancer.  Would like it scheduled as either one of following depending on endo vs. OR availability:   (1) Tuesday endo add-on for either Dr. Lamonte Sakai or Dr. Valeta Harms (I am in clinic)  (2) Wednesday OR add-on with me   Procedure: Bronchoscopy with EBUS  CPT: 31652  ICD10/diagnosis: R921.8 Lung mass   Once we have tissue diagnosis will need to discuss treatment options: based on baseline functional status and location of tumor she would be very marginal surgical candidate but would want [place]'s input.  Have Cc'd Norton Blizzard to keep her on the radar for tumor conference.   Thanks!  Erskine Emery    Attempted to call Katie with Resp to see if we can get anything arranged for pt with dates shown above by Dr. Tamala Julian and with the MDs but unable to reach her. Left message for Joellen Jersey to return call.  Transferring call to triage so we can have in there to f/u on.

## 2019-04-26 ENCOUNTER — Other Ambulatory Visit (HOSPITAL_COMMUNITY)
Admission: RE | Admit: 2019-04-26 | Discharge: 2019-04-26 | Disposition: A | Discharge status: Home / Self Care | Payer: 59 | Source: Ambulatory Visit | Attending: Internal Medicine | Admitting: Internal Medicine

## 2019-04-26 ENCOUNTER — Other Ambulatory Visit: Payer: Self-pay

## 2019-04-26 DIAGNOSIS — Z20822 Contact with and (suspected) exposure to covid-19: Secondary | ICD-10-CM | POA: Diagnosis not present

## 2019-04-26 DIAGNOSIS — Z01812 Encounter for preprocedural laboratory examination: Secondary | ICD-10-CM | POA: Diagnosis present

## 2019-04-26 LAB — SARS CORONAVIRUS 2 (TAT 6-24 HRS): SARS Coronavirus 2: NEGATIVE

## 2019-04-28 NOTE — Progress Notes (Signed)
[facility] CONSULT NOTE  Patient Care Team: [name], NP as PCP - General (Internal Medicine) Jettie Booze, MD as PCP - Cardiology (Cardiology) Evans Lance, MD as PCP - Electrophysiology (Cardiology) Evans Lance, MD as Consulting Physician (Cardiology)  HEME/ONC OVERVIEW: 1. Stage IIB [id number]), limited stage small cell lung cancer  -03/2019: multiple bilateral lung nodules on CT, largest 2cm left perihilar nodule  -04/2019:   2.5cm LUL nodule (SUV 7.5), suspicious for malignancy; other lung nodules improving in size without FDG avidity, likely infectious  EBUS with bx scheduled on 04/30/2019; path small cell lung cancer (LUL nodule and 10L LN)  ASSESSMENT & PLAN:   Stage IIB (cT1cN1M0), limited stage small cell lung cancer  -I reviewed the patient's records in detail, including hospitalization notes in 03/2019, lab studies, and imaging results -I also independently reviewed the radiologic images of recent CT and PET, and agree with the findings as documented -In summary, patient presented to the ER in 03/2019 for new onset chest pain, and CT chest showed multiple bilateral pulmonary nodules, the largest of which measuring approximately 2 cm in the left perihilar area.  There was no evidence of PTE.  Patient underwent PET in 04/2019, which showed an FDG-avid left perihilar nodule, suspicious for malignancy.  Patient underwent EBUS on 04/30/2019, which showed small cell lung cancer from the LUL and 10L LN biopsies.  -I discussed the imaging and pathology results in detail with the patient, as well as NCCN guideline -Given that the small cell lung cancer appears to be limited stage, the standard-of-care approach is concurrent chemoradiation with cisplatin and etoposide -We discussed some of the risks, benefits and side-effects of cisplatin/etoposide.  The regimen is given every 3 weeks x 4 cycles.  The goal of treatment is for curative intent.  -Some  of the short term side-effects included, though not limited to, risk of fatigue, weight loss, tumor lysis syndrome, risk of allergic reactions, pancytopenia, life-threatening infections, need for transfusions of blood products, nausea, vomiting, change in bowel habits, admission to hospital for various reasons, and risks of death.  -Long term side-effects are also discussed including permanent damage to nerve function, chronic fatigue, and rare secondary malignancy including bone marrow disorders.  -The patient is aware that the response rates discussed earlier is not guaranteed.   -After a long discussion, patient made an informed decision to proceed with the prescribed plan of care.  -I have ordered port placement to facilitate treatment -In addition, I have ordered MRI brain w/o and w/ contrast to rule out brain metastases -furthermore, I have referred the patient to radiation oncology for evaluation of the concurrent chemoradiation -As the patient resides in [place] and prefers to receive treatment there, I have discussed the case with the lung navigator, who will assist the patient with establishment of care with Dr. Julien Nordmann  Elevated ferritin -Ferritin was incidentally noted to be elevated 669 in 03/2019; no other iron panel was checked  -I have ordered serum iron, saturation, TIBC and ferritin -In the setting of small cell lung cancer, ferritin elevation is likely due to underlying malignancy  -No further work-up or treatment indicated at this time  History of tobacco abuse -I congratulated the patient on quitting smoking, and strongly encouraged her to abstain from all tobacco products, as her small cell lung cancer is most likely related to history of extensive smoking -Patient expressed understanding, and agreed with the plan  Orders Placed This Encounter  Procedures  . MR  BRAIN W WO CONTRAST    Standing Status:   Future    Standing Expiration Date:   05/01/2020    Order Specific  Question:   If indicated for the ordered procedure, I authorize the administration of contrast media per Radiology protocol    Answer:   Yes    Order Specific Question:   What is the patient's sedation requirement?    Answer:   No Sedation    Order Specific Question:   Does the patient have a pacemaker or implanted devices?    Answer:   No    Order Specific Question:   Use SRS Protocol?    Answer:   No    Order Specific Question:   Radiology Contrast Protocol - do NOT remove file path    Answer:   \\[email or web address]    Order Specific Question:   Preferred imaging location?    Answer:   [facility] (table limit-350 lbs)  . IR IMAGING GUIDED PORT INSERTION    Standing Status:   Future    Standing Expiration Date:   06/29/2020    Order Specific Question:   Reason for Exam (SYMPTOM  OR DIAGNOSIS REQUIRED)    Answer:   small cell lung cancer, need chemo access    Order Specific Question:   Preferred Imaging Location?    Answer:   [facility]  . Ambulatory referral to Radiation Oncology    Referral Priority:   Urgent    Referral Type:   Consultation    Referral Reason:   Specialty Services Required    Requested Specialty:   Radiation Oncology    Number of Visits Requested:   1    The total time spent in the encounter was 70 minutes, including face-to-face time with the patient, review of various tests results, order additional studies/medications, documentation, and coordination of care plan.   All questions were answered. The patient knows to call the clinic with any problems, questions or concerns. No barriers to learning was detected.  Return to clinic appt to be scheduled with Dr. Julien Nordmann at [facility].   Christy Men, MD 2/19/20211:26 PM  CHIEF COMPLAINTS/PURPOSE OF CONSULTATION:  "I just found out that I have lung cancer"  HISTORY OF PRESENTING ILLNESS:  Via S Leppo 64 y.o. female is referred here by her PCP for elevated  ferritin.  In the interim, she was also diagnosed with small cell lung cancer.  Patient presented to the ER in 03/2019 for new onset chest pain, and CT chest showed multiple bilateral pulmonary nodules, the largest of which measuring approximately 2 cm in the left perihilar area.  There was no evidence of PTE.  In addition, she was found with acute sigmoid diverticulitis and E. Coli bacteremia, for which she was treated with IV Rocephin.  Due to a concern for infection in the lungs, the patient was treated with 3 weeks of PO abx, and discharged home with supplemental O2 and outpatient pulmonary follow-up.  She  underwent PET in 04/2019, which showed an FDG-avid left perihilar nodule, suspicious for malignancy.  Patient underwent EBUS on 04/30/2019, which showed small cell lung cancer.   Patient reports that since the recent hospitalization, she has quit smoking for about a month.  She used to smoke 1 to 1.5 packs/day for least 30 years.  She does not drink or use illicit drugs.  She works as a Radiation protection practitioner.  She denies any constitutional symptoms, chest pain, dyspnea, or palpitation.  She denies  any other complaint today.  REVIEW OF SYSTEMS:   Constitutional: ( - ) fevers, ( - )  chills , ( - ) night sweats Eyes: ( - ) blurriness of vision, ( - ) double vision, ( - ) watery eyes Ears, nose, mouth, throat, and face: ( - ) mucositis, ( - ) sore throat Respiratory: ( - ) cough, ( - ) dyspnea, ( - ) wheezes Cardiovascular: ( - ) palpitation, ( - ) chest discomfort, ( - ) lower extremity swelling Gastrointestinal:  ( - ) nausea, ( - ) heartburn, ( - ) change in bowel habits Skin: ( - ) abnormal skin rashes Lymphatics: ( - ) new lymphadenopathy, ( - ) easy bruising Neurological: ( - ) numbness, ( - ) tingling, ( - ) new weaknesses Behavioral/Psych: ( - ) mood change, ( - ) new changes  All other systems were reviewed with the patient and are negative.  I have reviewed her chart and materials related to her  cancer extensively and collaborated history with the patient. Summary of oncologic history is as follows: Oncology History  Small cell lung cancer ([place])  03/22/2019 Initial Diagnosis   Small cell lung cancer ([place])   05/02/2019 Cancer Staging   Staging form: Lung, AJCC 8th Edition - Clinical: Stage IIB (cT1c, cN1, cM0) - Signed by Christy Men, MD on 05/02/2019     MEDICAL HISTORY:  Past Medical History:  Diagnosis Date  . Arthritis   . Atrial flutter [facility])    s/p CTI by Dr Lovena Le 02/2013  . Chest pain   . Diabetes mellitus without complication ([place])   . Dysrhythmia    hx of Aflutter  . 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 Hemochromatosis associated with compound heterozygous mutation in HFE gene ([place]) 12/10/2017  . Hypertension   . Hyperthyroidism 10/31/2014    SURGICAL HISTORY: Past Surgical History:  Procedure Laterality Date  . ABLATION  03-04-2013   CTI by Dr Lovena Le for atrial flutter  . ATRIAL FLUTTER ABLATION N/A 03/04/2013   Procedure: ATRIAL FLUTTER ABLATION;  Surgeon: Evans Lance, MD;  Location: [facility] CATH LAB;  Service: Cardiovascular;  Laterality: N/A;  . CHOLECYSTECTOMY  1982  . COLONOSCOPY W/ POLYPECTOMY     x 2  . ESOPHAGOGASTRODUODENOSCOPY N/A 05/20/2014   Procedure: ESOPHAGOGASTRODUODENOSCOPY (EGD);  Surgeon: Teena Irani, MD;  Location: [facility] ENDOSCOPY;  Service: Endoscopy;  Laterality: N/A;  . TONSILLECTOMY  1972 ?  Marland Kitchen VIDEO BRONCHOSCOPY WITH ENDOBRONCHIAL ULTRASOUND Left 04/30/2019   Procedure: VIDEO BRONCHOSCOPY WITH ENDOBRONCHIAL ULTRASOUND;  Surgeon: Garner Nash, DO;  Location: [facility];  Service: Pulmonary;  Laterality: Left;    SOCIAL HISTORY: Social History   Socioeconomic History  . Marital status: Married    Spouse name: Not on file  . Number of children: Not on file  . Years of education: Not on file  . Highest education level: Not on file  Occupational History  . Not on file  Tobacco Use  . Smoking status: Former Smoker     Packs/day: 1.00    Types: Cigarettes    Quit date: 03/21/2019    Years since quitting: 0.1  . Smokeless tobacco: Never Used  . Tobacco comment: started age 11 , quit for a year many times  Substance and Sexual Activity  . Alcohol use: No    Alcohol/week: 0.0 standard drinks  . Drug use: No  . Sexual activity: Not on file  Other Topics Concern  . Not on  file  Social History Narrative   Married   Clinical cytogeneticist- full time   Daughter- 2 grandchildren, live in [place]   Enjoys playing on computer.     Social Determinants of Health   Financial Resource Strain:   . Difficulty of Paying Living Expenses: Not on file  Food Insecurity:   . Worried About Charity fundraiser in the Last Year: Not on file  . Ran Out of Food in the Last Year: Not on file  Transportation Needs:   . Lack of Transportation (Medical): Not on file  . Lack of Transportation (Non-Medical): Not on file  Physical Activity:   . Days of Exercise per Week: Not on file  . Minutes of Exercise per Session: Not on file  Stress:   . Feeling of Stress : Not on file  Social Connections:   . Frequency of Communication with Friends and Family: Not on file  . Frequency of Social Gatherings with Friends and Family: Not on file  . Attends Religious Services: Not on file  . Active Member of Clubs or Organizations: Not on file  . Attends Archivist Meetings: Not on file  . Marital Status: Not on file  Intimate Partner Violence:   . Fear of Current or Ex-Partner: Not on file  . Emotionally Abused: Not on file  . Physically Abused: Not on file  . Sexually Abused: Not on file    FAMILY HISTORY: Family History  Problem Relation Age of Onset  . Atrial fibrillation Mother   . Arrhythmia Mother   . Diabetes Mother   . Hodgkin's lymphoma Father     ALLERGIES:  is allergic to jardiance [empagliflozin] and trazodone and nefazodone.  MEDICATIONS:  Current Outpatient Medications  Medication Sig Dispense Refill  .  acetaminophen (TYLENOL) 500 MG tablet Take 1,000 mg by mouth every 6 (six) hours as needed for moderate pain or headache.    Marland Kitchen Bioflavonoid Products (ESTER C PO) Take 1 tablet by mouth daily.    . Calcium Carb-Cholecalciferol (CALTRATE 600+D3 PO) Take 1 tablet by mouth daily.    . carvedilol (COREG) 12.5 MG tablet TAKE 1 TABLET BY MOUTH TWICE DAILY ( PATIENT MUST KEEP UPCOMING APPOINTMENT ) (Patient taking differently: Take 12.5 mg by mouth in the morning and at bedtime. ) 180 tablet 3  . diphenhydramine-acetaminophen (TYLENOL PM) 25-500 MG TABS tablet Take 2 tablets by mouth at bedtime.    . flecainide (TAMBOCOR) 50 MG tablet TAKE 1 TABLET BY MOUTH TWICE DAILY, MAY TAKE AN ADDITIONAL 1 TABLET AS NEEDED. Please keep upcoming appt in January. Thank you (Patient taking differently: Take 50 mg by mouth See admin instructions. Take 50 mg by mouth twice daily, may take a third 50 mg dose as needed for palpitations) 270 tablet 0  . furosemide (LASIX) 20 MG tablet Take 1 tablet (20 mg total) by mouth daily as needed. (Patient taking differently: Take 20 mg by mouth daily as needed for edema. ) 60 tablet 0  . glimepiride (AMARYL) 4 MG tablet Take 1 tablet (4 mg total) by mouth daily with breakfast. TAKE 1 TABLET BY MOUTH ONCE DAILY BEFORE BREAKFAST (Patient taking differently: Take 4 mg by mouth daily with breakfast. )    . insulin glargine (LANTUS) 100 unit/mL SOPN Inject 0.1 mLs (10 Units total) into the skin daily. (Patient taking differently: Inject 10 Units into the skin at bedtime. ) 15 mL 2  . Insulin Pen Needle (B-D UF III MINI PEN NEEDLES) 31G X  5 MM MISC Use with insulin pen 100 each 3  . Lancets 30G MISC Check sugar twice daily. 100 each 3  . lisinopril-hydrochlorothiazide (PRINZIDE,ZESTORETIC) 20-25 MG tablet Take 1 tablet by mouth daily. 90 tablet 1  . metFORMIN (GLUCOPHAGE) 500 MG tablet Take 2 tablets (1,000 mg total) by mouth 2 (two) times daily. 120 tablet 1  . methimazole (TAPAZOLE) 10 MG  tablet TAKE 1 & 1/2 (ONE & ONE-HALF) TABLETS BY MOUTH ONCE DAILY (Patient taking differently: Take 15 mg by mouth daily. ) 45 tablet 0  . Omega-3 Fatty Acids (FISH OIL) 1200 MG CAPS Take 1,200 mg by mouth daily.     . potassium chloride SA (KLOR-CON) 20 MEQ tablet TAKE 1  BY MOUTH ONCE DAILY (Patient taking differently: Take 20 mEq by mouth daily. ) 90 tablet 0  . simvastatin (ZOCOR) 10 MG tablet TAKE 1 TABLET BY MOUTH AT BEDTIME (Patient taking differently: Take 10 mg by mouth at bedtime. ) 90 tablet 0  . Tetrahydrozoline HCl (VISINE OP) Place 1 drop into both eyes daily as needed (irritation).    Marland Kitchen albuterol (VENTOLIN HFA) 108 (90 Base) MCG/ACT inhaler Inhale 2 puffs into the lungs every 6 (six) hours as needed for wheezing or shortness of breath. (Patient not taking: Reported on 04/25/2019) 6.7 g 0  . guaiFENesin-dextromethorphan (ROBITUSSIN DM) 100-10 MG/5ML syrup Take 5 mLs by mouth every 4 (four) hours as needed for cough. (Patient not taking: Reported on 05/02/2019) 118 mL 0   No current facility-administered medications for this visit.    PHYSICAL EXAMINATION: ECOG PERFORMANCE STATUS: 0 - Asymptomatic  Vitals:   05/02/19 1241  BP: (!) 157/84  Pulse: 70  Resp: 18  Temp: (!) 97.3 F (36.3 C)  SpO2: 98%   Filed Weights   05/02/19 1241  Weight: 149 lb 0.6 oz (67.6 kg)    GENERAL: alert, no distress and comfortable SKIN: skin color, texture, turgor are normal, no rashes or significant lesions EYES: conjunctiva are pink and non-injected, sclera clear OROPHARYNX: no exudate, no erythema; lips, buccal mucosa, and tongue normal  NECK: supple, non-tender LUNGS: clear to auscultation with normal breathing effort HEART: regular rate & rhythm, no murmurs, no lower extremity edema ABDOMEN: soft, non-tender, non-distended, normal bowel sounds Musculoskeletal: no cyanosis of digits and no clubbing  PSYCH: alert & oriented x 3, fluent speech  LABORATORY DATA:  I have reviewed the data as  listed Lab Results  Component Value Date   WBC 10.7 (H) 05/02/2019   HGB 14.3 05/02/2019   HCT 43.2 05/02/2019   MCV 91.5 05/02/2019   PLT 243 05/02/2019   Lab Results  Component Value Date   NA 141 05/02/2019   K 4.4 05/02/2019   CL 101 05/02/2019   CO2 32 05/02/2019    RADIOGRAPHIC STUDIES: I have personally reviewed the radiological images as listed and agreed with the findings in the report. DG Chest 2 View  Result Date: 04/09/2019 CLINICAL DATA:  Follow-up pneumonia EXAM: CHEST - 2 VIEW COMPARISON:  03/27/2019, 03/25/2019, CT 03/21/2018 FINDINGS: Clearing of previously noted bilateral pleural effusions and basilar airspace disease. Faint focus of slightly nodular opacity in the right upper lobe. Other lung nodules by CT are not well seen radiographically. Normal heart size. No pneumothorax. IMPRESSION: 1. Interval resolution of small pleural effusions and basilar airspace disease. 2. Residual focus of nodularity and mild airspace disease in the right upper lobe, likely corresponding to the CT demonstrated possible pulmonary nodule for which CT follow-up was previously  recommended, refer to 05/02/2019 CT chest report. Electronically Signed   By: Donavan Foil M.D.   On: 04/09/2019 15:53   NM PET Image Initial (PI) Skull Base To Thigh  Result Date: 04/22/2019 CLINICAL DATA:  Initial treatment strategy for multiple pulmonary nodules. EXAM: NUCLEAR MEDICINE PET SKULL BASE TO THIGH TECHNIQUE: 7.1 mCi F-18 FDG was injected intravenously. Full-ring PET imaging was performed from the skull base to thigh after the radiotracer. CT data was obtained and used for attenuation correction and anatomic localization. Fasting blood glucose: 145 mg/dl COMPARISON:  CTA chest dated 03/22/2019 FINDINGS: Mediastinal blood pool activity: SUV max 2.7 Liver activity: SUV max NA NECK: No hypermetabolic cervical lymphadenopathy. Incidental CT findings: none CHEST: 2.5 cm central left upper lobe/perihilar nodule  (series 8/image 33), previously 2.1 cm, max SUV 7.5. This is suspicious for primary bronchogenic neoplasm. 12 mm nodular opacity in the posterior right upper lobe (series 8/image 20), previously 15 mm, max SUV 1.4. 7 mm subpleural nodule in the posterior right lower lobe (series 8/image 29) and additional 10 mm bilobed subpleural nodule in the posterior right lower lobe (series 8/image 39), previously 8 and 10 mm respectively, max SUV 2.7. 7 mm posterior left lower lobe nodule (series 8/image 47), previously 12 mm. Additional scattered small bilateral pulmonary nodules. These findings are indeterminate but favor multifocal infection. Lingular atelectasis, mildly progressive. Mild patchy opacity in the medial left upper lobe (series 8/image 29), unchanged. No pleural effusion or pneumothorax. No hypermetabolic thoracic lymphadenopathy. Incidental CT findings: Atherosclerotic calcifications of the aortic arch. Mild coronary atherosclerosis of the LAD. ABDOMEN/PELVIS: No hypermetabolic abdominopelvic lymphadenopathy. No abnormal hypermetabolism in the liver, spleen, pancreas, or adrenal glands. Incidental CT findings: Status post cholecystectomy. Atherosclerotic calcifications the abdominal aorta and branch vessels. SKELETON: No focal hypermetabolic activity to suggest skeletal metastasis. Incidental CT findings: Degenerative changes of the visualized thoracolumbar spine. IMPRESSION: 2.5 cm central left upper lobe/perihilar nodule, mildly progressive, suspicious for primary bronchogenic neoplasm. Scattered small bilateral pulmonary nodules, most of which are mildly improved, favoring mild multifocal infection. No evidence of metastatic disease in the abdomen/pelvis. Electronically Signed   By: Julian Hy M.D.   On: 04/22/2019 14:46    PATHOLOGY: I have reviewed the pathology reports as documented in the oncologist history.

## 2019-04-28 NOTE — Telephone Encounter (Signed)
Due to never hearing from pt to make sure she received my message, I called pt and was able to reach her. Pt stated she did receive my message that she did not need to have the appt tomorrow 2/16. Nothing further needed.

## 2019-04-28 NOTE — Telephone Encounter (Signed)
I have cancelled pt's f/u which was scheduled Tues. 2/16. Attempted to call pt to let her know that she would not need to come to office for appt due to [place]. 217 but unable to reach her. Left detailed message for pt letting her know this info and also stated to pt in the message for her to return call so we know she is aware that she does not need to come for appt. Will leave open until pt returns call.

## 2019-04-29 ENCOUNTER — Other Ambulatory Visit: Payer: Self-pay

## 2019-04-29 ENCOUNTER — Ambulatory Visit: Payer: 59 | Admitting: Internal Medicine

## 2019-04-29 ENCOUNTER — Encounter (HOSPITAL_COMMUNITY): Payer: Self-pay | Admitting: Internal Medicine

## 2019-04-29 NOTE — Anesthesia Preprocedure Evaluation (Addendum)
Anesthesia Evaluation  Patient identified by MRN, date of birth, ID band Patient awake    Reviewed: Allergy & Precautions, NPO status , Patient's Chart, lab work & pertinent test results  History of Anesthesia Complications Negative for: history of anesthetic complications  Airway Mallampati: II  TM Distance: >3 FB Neck ROM: Full    Dental  (+) Dental Advisory Given, Edentulous Upper, Partial Lower   Pulmonary former smoker,    Pulmonary exam normal        Cardiovascular hypertension, Pt. on home beta blockers and Pt. on medications Normal cardiovascular exam+ dysrhythmias Atrial Fibrillation    '21 TTE - EF 60 to 65%. Grade I diastolic dysfunction (impaired relaxation). Trivial MR. Mild dilatation of the aortic root measuring 40 mm.     Neuro/Psych negative neurological ROS  negative psych ROS   GI/Hepatic Neg liver ROS, GERD  Controlled and Medicated, Gastric tumor    Endo/Other  diabetes, Type 2, Insulin Dependent, Oral Hypoglycemic AgentsHyperthyroidism   Renal/GU negative Renal ROS     Musculoskeletal  (+) Arthritis ,   Abdominal   Peds  Hematology negative hematology ROS (+)   Anesthesia Other Findings Covid neg 2/13   Reproductive/Obstetrics                            Anesthesia Physical Anesthesia Plan  ASA: III  Anesthesia Plan: General   Post-op Pain Management:    Induction: Intravenous  PONV Risk Score and Plan: 3 and Treatment may vary due to age or medical condition, Ondansetron, Dexamethasone and Midazolam  Airway Management Planned: Oral ETT  Additional Equipment: None  Intra-op Plan:   Post-operative Plan: Extubation in OR  Informed Consent: I have reviewed the patients History and Physical, chart, labs and discussed the procedure including the risks, benefits and alternatives for the proposed anesthesia with the patient or authorized representative who  has indicated his/her understanding and acceptance.     Dental advisory given  Plan Discussed with: CRNA and Anesthesiologist  Anesthesia Plan Comments:        Anesthesia Quick Evaluation

## 2019-04-29 NOTE — Progress Notes (Addendum)
Christy Abbott denies chest pain or shortness. Patient tested neg 04/26/2019 and has been in quarantine with family then  Christy Abbott has Type II diabetes, patients A1C was 13.9 03/23/2019. Patient's PCP  wants patient see an endocrinologist , patient said she will, but she has been occupied with lung mass tesitng and appointments. Christy Abbott  reports that A1C since she came home from the hospital have run 140- 223. I instructed patient to take 5 units of `Lantus tonight and no medication for diabetes in am. I instructed patient to check CBG after awaking and every 2 hours until arrival  to the hospital.  I Instructed patient if CBG is less than 70 to drink 1/2 cup of a clear juice. Recheck CBG in 15 minutes then call pre- op desk at [phone] for further instructions.  I spoke to Dr Tobias Alexander a bout 03/2019 hospital stay, A1Cs have bee at home.No new orders.

## 2019-04-30 ENCOUNTER — Ambulatory Visit (HOSPITAL_COMMUNITY): Payer: 59 | Admitting: Anesthesiology

## 2019-04-30 ENCOUNTER — Other Ambulatory Visit: Payer: Self-pay | Admitting: Hematology

## 2019-04-30 ENCOUNTER — Encounter (HOSPITAL_COMMUNITY): Payer: Self-pay | Admitting: Internal Medicine

## 2019-04-30 ENCOUNTER — Ambulatory Visit (HOSPITAL_COMMUNITY)
Admission: RE | Admit: 2019-04-30 | Discharge: 2019-04-30 | Disposition: A | Discharge status: Home / Self Care | Payer: 59 | Source: Ambulatory Visit | Attending: Internal Medicine | Admitting: Internal Medicine

## 2019-04-30 ENCOUNTER — Encounter (HOSPITAL_COMMUNITY)
Admission: RE | Disposition: A | Discharge status: Home / Self Care | Payer: Self-pay | Source: Ambulatory Visit | Attending: Internal Medicine

## 2019-04-30 DIAGNOSIS — R918 Other nonspecific abnormal finding of lung field: Secondary | ICD-10-CM

## 2019-04-30 DIAGNOSIS — C771 Secondary and unspecified malignant neoplasm of intrathoracic lymph nodes: Secondary | ICD-10-CM | POA: Diagnosis not present

## 2019-04-30 DIAGNOSIS — K219 Gastro-esophageal reflux disease without esophagitis: Secondary | ICD-10-CM | POA: Insufficient documentation

## 2019-04-30 DIAGNOSIS — Z87891 Personal history of nicotine dependence: Secondary | ICD-10-CM | POA: Insufficient documentation

## 2019-04-30 DIAGNOSIS — J9621 Acute and chronic respiratory failure with hypoxia: Secondary | ICD-10-CM | POA: Diagnosis not present

## 2019-04-30 DIAGNOSIS — E119 Type 2 diabetes mellitus without complications: Secondary | ICD-10-CM | POA: Diagnosis not present

## 2019-04-30 DIAGNOSIS — Z79899 Other long term (current) drug therapy: Secondary | ICD-10-CM | POA: Insufficient documentation

## 2019-04-30 DIAGNOSIS — I10 Essential (primary) hypertension: Secondary | ICD-10-CM | POA: Diagnosis not present

## 2019-04-30 DIAGNOSIS — Z794 Long term (current) use of insulin: Secondary | ICD-10-CM | POA: Diagnosis not present

## 2019-04-30 DIAGNOSIS — R7989 Other specified abnormal findings of blood chemistry: Secondary | ICD-10-CM

## 2019-04-30 DIAGNOSIS — C3412 Malignant neoplasm of upper lobe, left bronchus or lung: Secondary | ICD-10-CM | POA: Diagnosis not present

## 2019-04-30 DIAGNOSIS — C3490 Malignant neoplasm of unspecified part of unspecified bronchus or lung: Secondary | ICD-10-CM | POA: Diagnosis present

## 2019-04-30 HISTORY — PX: VIDEO BRONCHOSCOPY WITH ENDOBRONCHIAL ULTRASOUND: SHX6177

## 2019-04-30 HISTORY — DX: Cardiac arrhythmia, unspecified: I49.9

## 2019-04-30 HISTORY — DX: Unspecified osteoarthritis, unspecified site: M19.90

## 2019-04-30 LAB — COMPREHENSIVE METABOLIC PANEL
ALT: 14 U/L (ref 0–44)
AST: 19 U/L (ref 15–41)
Albumin: 3.3 g/dL — ABNORMAL LOW (ref 3.5–5.0)
Alkaline Phosphatase: 87 U/L (ref 38–126)
Anion gap: 12 (ref 5–15)
BUN: 12 mg/dL (ref 8–23)
CO2: 25 mmol/L (ref 22–32)
Calcium: 9.1 mg/dL (ref 8.9–10.3)
Chloride: 103 mmol/L (ref 98–111)
Creatinine, Ser: 0.68 mg/dL (ref 0.44–1.00)
GFR calc Af Amer: >60 mL/min (ref >60)
GFR calc non Af Amer: >60 mL/min (ref >60)
Glucose, Bld: 114 mg/dL — ABNORMAL HIGH (ref 70–99)
Potassium: 3.3 mmol/L — ABNORMAL LOW (ref 3.5–5.1)
Sodium: 140 mmol/L (ref 135–145)
Total Bilirubin: 0.8 mg/dL (ref 0.3–1.2)
Total Protein: 6.1 g/dL — ABNORMAL LOW (ref 6.5–8.1)

## 2019-04-30 LAB — PROTIME-INR
INR: 0.9 (ref 0.8–1.2)
Prothrombin Time: 12 seconds (ref 11.4–15.2)

## 2019-04-30 LAB — CBC
HCT: 41.8 % (ref 36.0–46.0)
Hemoglobin: 13.8 g/dL (ref 12.0–15.0)
MCH: 30.5 pg (ref 26.0–34.0)
MCHC: 33 g/dL (ref 30.0–36.0)
MCV: 92.3 fL (ref 80.0–100.0)
Platelets: 250 10*3/uL (ref 150–400)
RBC: 4.53 MIL/uL (ref 3.87–5.11)
RDW: 13.2 % (ref 11.5–15.5)
WBC: 10.1 10*3/uL (ref 4.0–10.5)
nRBC: 0 % (ref 0.0–0.2)

## 2019-04-30 LAB — TYPE AND SCREEN
ABO/RH(D): A POS
Antibody Screen: NEGATIVE

## 2019-04-30 LAB — GLUCOSE, CAPILLARY
Glucose-Capillary: 102 mg/dL — ABNORMAL HIGH (ref 70–99)
Glucose-Capillary: 123 mg/dL — ABNORMAL HIGH (ref 70–99)

## 2019-04-30 LAB — ABO/RH: ABO/RH(D): A POS

## 2019-04-30 LAB — APTT: aPTT: 27 seconds (ref 24–36)

## 2019-04-30 SURGERY — BRONCHOSCOPY, WITH EBUS
Anesthesia: General | Laterality: Left

## 2019-04-30 MED ORDER — SUCCINYLCHOLINE CHLORIDE 200 MG/10ML IV SOSY
PREFILLED_SYRINGE | INTRAVENOUS | Status: AC
  Filled 2019-04-30: qty 10

## 2019-04-30 MED ORDER — EPINEPHRINE PF 1 MG/ML IJ SOLN
INTRAMUSCULAR | Status: AC
  Filled 2019-04-30: qty 1

## 2019-04-30 MED ORDER — LACTATED RINGERS IV SOLN: INTRAVENOUS | Status: DC | PRN

## 2019-04-30 MED ORDER — PROPOFOL 10 MG/ML IV BOLUS
INTRAVENOUS | Status: DC | PRN
  Administered 2019-04-30: 180 ug via INTRAVENOUS

## 2019-04-30 MED ORDER — PHENYLEPHRINE 40 MCG/ML (10ML) SYRINGE FOR IV PUSH (FOR BLOOD PRESSURE SUPPORT)
PREFILLED_SYRINGE | INTRAVENOUS | Status: AC
  Filled 2019-04-30: qty 10

## 2019-04-30 MED ORDER — OXYCODONE HCL 5 MG/5ML PO SOLN: 5.0000 mg | Freq: Once | ORAL | Status: DC | PRN

## 2019-04-30 MED ORDER — PROPOFOL 10 MG/ML IV BOLUS
INTRAVENOUS | Status: AC
  Filled 2019-04-30: qty 40

## 2019-04-30 MED ORDER — SUGAMMADEX SODIUM 200 MG/2ML IV SOLN
INTRAVENOUS | Status: DC | PRN
  Administered 2019-04-30: 200 mg via INTRAVENOUS

## 2019-04-30 MED ORDER — PHENYLEPHRINE HCL-NACL 10-0.9 MG/250ML-% IV SOLN: INTRAVENOUS | Status: DC | PRN

## 2019-04-30 MED ORDER — DEXAMETHASONE SODIUM PHOSPHATE 10 MG/ML IJ SOLN
INTRAMUSCULAR | Status: DC | PRN
  Administered 2019-04-30: 5 mg via INTRAVENOUS

## 2019-04-30 MED ORDER — GLYCOPYRROLATE 0.2 MG/ML IJ SOLN
INTRAMUSCULAR | Status: DC | PRN
  Administered 2019-04-30: .1 mg via INTRAVENOUS

## 2019-04-30 MED ORDER — ONDANSETRON HCL 4 MG/2ML IJ SOLN: 4.0000 mg | Freq: Once | INTRAMUSCULAR | Status: DC | PRN

## 2019-04-30 MED ORDER — FENTANYL CITRATE (PF) 250 MCG/5ML IJ SOLN
INTRAMUSCULAR | Status: DC | PRN
  Administered 2019-04-30: 100 ug via INTRAVENOUS

## 2019-04-30 MED ORDER — 0.9 % SODIUM CHLORIDE (POUR BTL) OPTIME
TOPICAL | Status: DC | PRN
  Administered 2019-04-30: 09:00:00 1000 mL

## 2019-04-30 MED ORDER — FENTANYL CITRATE (PF) 250 MCG/5ML IJ SOLN
INTRAMUSCULAR | Status: AC
  Filled 2019-04-30: qty 5

## 2019-04-30 MED ORDER — ROCURONIUM BROMIDE 10 MG/ML (PF) SYRINGE
PREFILLED_SYRINGE | INTRAVENOUS | Status: AC
  Filled 2019-04-30: qty 10

## 2019-04-30 MED ORDER — FENTANYL CITRATE (PF) 100 MCG/2ML IJ SOLN: 25.0000 ug | INTRAMUSCULAR | Status: DC | PRN

## 2019-04-30 MED ORDER — DEXAMETHASONE SODIUM PHOSPHATE 10 MG/ML IJ SOLN
INTRAMUSCULAR | Status: AC
  Filled 2019-04-30: qty 1

## 2019-04-30 MED ORDER — PHENYLEPHRINE HCL (PRESSORS) 10 MG/ML IV SOLN
INTRAVENOUS | Status: DC | PRN
  Administered 2019-04-30: 40 ug via INTRAVENOUS
  Administered 2019-04-30 (×8): 80 ug via INTRAVENOUS

## 2019-04-30 MED ORDER — OXYCODONE HCL 5 MG PO TABS: 5.0000 mg | ORAL_TABLET | Freq: Once | ORAL | Status: DC | PRN

## 2019-04-30 MED ORDER — ONDANSETRON HCL 4 MG/2ML IJ SOLN
INTRAMUSCULAR | Status: AC
  Filled 2019-04-30: qty 2

## 2019-04-30 MED ORDER — EPINEPHRINE PF 1 MG/ML IJ SOLN
INTRAMUSCULAR | Status: DC | PRN
  Administered 2019-04-30: 1 mg via ENDOTRACHEOPULMONARY

## 2019-04-30 MED ORDER — ONDANSETRON HCL 4 MG/2ML IJ SOLN
INTRAMUSCULAR | Status: DC | PRN
  Administered 2019-04-30: 4 mg via INTRAVENOUS

## 2019-04-30 MED ORDER — LIDOCAINE HCL (CARDIAC) PF 100 MG/5ML IV SOSY
PREFILLED_SYRINGE | INTRAVENOUS | Status: DC | PRN
  Administered 2019-04-30: 60 mg via INTRATRACHEAL

## 2019-04-30 MED ORDER — CARVEDILOL 12.5 MG PO TABS
12.5000 mg | ORAL_TABLET | Freq: Once | ORAL | Status: AC
  Administered 2019-04-30: 12.5 mg via ORAL
  Filled 2019-04-30: qty 1

## 2019-04-30 MED ORDER — LIDOCAINE 2% (20 MG/ML) 5 ML SYRINGE
INTRAMUSCULAR | Status: AC
  Filled 2019-04-30: qty 5

## 2019-04-30 MED ORDER — MIDAZOLAM HCL 2 MG/2ML IJ SOLN
INTRAMUSCULAR | Status: AC
  Filled 2019-04-30: qty 2

## 2019-04-30 MED ORDER — MIDAZOLAM HCL 2 MG/2ML IJ SOLN
INTRAMUSCULAR | Status: DC | PRN
  Administered 2019-04-30: 2 mg via INTRAVENOUS

## 2019-04-30 MED ORDER — ROCURONIUM BROMIDE 100 MG/10ML IV SOLN
INTRAVENOUS | Status: DC | PRN
  Administered 2019-04-30: 50 mg via INTRAVENOUS

## 2019-04-30 SURGICAL SUPPLY — 37 items
ADAPTER VALVE BIOPSY EBUS (MISCELLANEOUS) IMPLANT
ADPTR VALVE BIOPSY EBUS (MISCELLANEOUS)
BRUSH CYTOL CELLEBRITY 1.5X140 (MISCELLANEOUS) IMPLANT
CANISTER SUCT 3000ML PPV (MISCELLANEOUS) ×6 IMPLANT
CNTNR URN SCR LID CUP LEK RST (MISCELLANEOUS) ×1 IMPLANT
CONT SPEC 4OZ STRL OR WHT (MISCELLANEOUS) ×2
COVER BACK TABLE 60X90IN (DRAPES) ×3 IMPLANT
FILTER STRAW FLUID ASPIR (MISCELLANEOUS) ×3 IMPLANT
FORCEPS BIOP RJ4 1.8 (CUTTING FORCEPS) ×3 IMPLANT
GAUZE SPONGE 4X4 12PLY STRL (GAUZE/BANDAGES/DRESSINGS) ×3 IMPLANT
GLOVE BIO SURGEON STRL SZ7.5 (GLOVE) IMPLANT
GLOVE BIOGEL PI IND STRL 7.5 (GLOVE) ×1 IMPLANT
GLOVE BIOGEL PI INDICATOR 7.5 (GLOVE) ×2
GOWN STRL REUS W/ TWL LRG LVL3 (GOWN DISPOSABLE) ×2 IMPLANT
GOWN STRL REUS W/TWL LRG LVL3 (GOWN DISPOSABLE) ×4
KIT CLEAN ENDO COMPLIANCE (KITS) ×6 IMPLANT
KIT TURNOVER KIT B (KITS) ×3 IMPLANT
MARKER SKIN DUAL TIP RULER LAB (MISCELLANEOUS) ×3 IMPLANT
NEEDLE ASPIRATION VIZISHOT 19G (NEEDLE) ×3 IMPLANT
NEEDLE ASPIRATION VIZISHOT 21G (NEEDLE) IMPLANT
NS IRRIG 1000ML POUR BTL (IV SOLUTION) ×3 IMPLANT
OIL SILICONE PENTAX (PARTS (SERVICE/REPAIRS)) ×3 IMPLANT
PAD ARMBOARD 7.5X6 YLW CONV (MISCELLANEOUS) IMPLANT
SYR 20ML ECCENTRIC (SYRINGE) ×3 IMPLANT
SYR 20ML LL LF (SYRINGE) ×3 IMPLANT
SYR 3ML LL SCALE MARK (SYRINGE) ×3 IMPLANT
SYR 50ML SLIP (SYRINGE) ×6 IMPLANT
SYR 5ML LUER SLIP (SYRINGE) ×3 IMPLANT
TOWEL GREEN STERILE FF (TOWEL DISPOSABLE) ×3 IMPLANT
TRAP SPECIMEN MUCOUS 40CC (MISCELLANEOUS) IMPLANT
TUBE CONNECTING 20'X1/4 (TUBING) ×1
TUBE CONNECTING 20X1/4 (TUBING) ×2 IMPLANT
UNDERPAD 30X30 (UNDERPADS AND DIAPERS) ×3 IMPLANT
VALVE BIOPSY  SINGLE USE (MISCELLANEOUS) ×2
VALVE BIOPSY SINGLE USE (MISCELLANEOUS) ×1 IMPLANT
VALVE SUCTION BRONCHIO DISP (MISCELLANEOUS) ×6 IMPLANT
WATER STERILE IRR 1000ML POUR (IV SOLUTION) ×3 IMPLANT

## 2019-04-30 NOTE — Interval H&P Note (Signed)
History and Physical Interval Note:  04/30/2019 8:01 AM  Christy Abbott  has presented today for surgery, with the diagnosis of LEFT LUNG MASS.  The various methods of treatment have been discussed with the patient and family. After consideration of risks, benefits and other options for treatment, the patient has consented to  Procedure(s): [place] (Left) as a surgical intervention.  The patient's history has been reviewed, patient examined, no change in status, stable for surgery.  I have reviewed the patient's chart and labs.  Questions were answered to the patient's satisfaction.    Patient seen in preop.  Agrees to pursuing.  Discussed risk benefits and alternatives including bleeding pneumothorax.  Divide

## 2019-04-30 NOTE — Transfer of Care (Signed)
Immediate Anesthesia Transfer of Care Note  Patient: Christy Abbott  Procedure(s) Performed: VIDEO BRONCHOSCOPY WITH ENDOBRONCHIAL ULTRASOUND (Left )  Patient Location: PACU  Anesthesia Type:General  Level of Consciousness: awake and patient cooperative  Airway & Oxygen Therapy: Patient Spontanous Breathing and Patient connected to face mask oxygen  Post-op Assessment: Report given to RN and Post -op Vital signs reviewed and stable  Post vital signs: Reviewed and stable  Last Vitals:  Vitals Value Taken Time  BP 143/82 04/30/19 0952  Temp    Pulse 79 04/30/19 0957  Resp 21 04/30/19 0957  SpO2 98 % 04/30/19 0957  Vitals shown include unvalidated device data.  Last Pain:  Vitals:   04/30/19 0659  PainSc: 0-No pain         Complications: No apparent anesthesia complications

## 2019-04-30 NOTE — Op Note (Signed)
Video Bronchoscopy with Endobronchial Ultrasound Procedure Note  Date of Operation: 04/30/2019  Pre-op Diagnosis: Left upper lobe lung mass, mediastinal adenopathy  Post-op Diagnosis: Left upper lobe lung mass, mediastinal adenopathy  Surgeon: Garner Nash, DO   Assistants: None   Anesthesia: General endotracheal anesthesia  Operation: Flexible video fiberoptic bronchoscopy with endobronchial ultrasound and biopsies.  Estimated Blood Loss: Minimal, less than 5 cc  Complications: None  Indications and History: Christy Abbott is a 64 y.o. female with PET avid left upper lobe lung mass, mediastinal adenopathy.  The risks, benefits, complications, treatment options and expected outcomes were discussed with the patient.  The possibilities of pneumothorax, pneumonia, reaction to medication, pulmonary aspiration, perforation of a viscus, bleeding, failure to diagnose a condition and creating a complication requiring transfusion or operation were discussed with the patient who freely signed the consent.    Description of Procedure: The patient was examined in the preoperative area and history and data from the preprocedure consultation were reviewed. It was deemed appropriate to proceed.  The patient was taken to [facility] R 11, identified as Christy Abbott and the procedure verified as Flexible Video Fiberoptic Bronchoscopy.  A Time Out was held and the above information confirmed. After being taken to the operating room general anesthesia was initiated and the patient  was orally intubated. The video fiberoptic bronchoscope was introduced via the endotracheal tube and a general inspection was performed which showed normal right lung anatomy, some scattered pitting, left lung anatomy however the lingula was totally occluded with tumor protruding into the main airway.  I used 2 mm [employer] airway forceps to remove the lingular endobronchial tumor.  Multiple biopsies were used.  The airway  was very small but we were able to core out the lingular opening.  The final chunk of tissue that was removed we were able to take out en bloc with retraction of the entire scope and forcep apparatus.  At the conclusion of the left lingular tumor debulking the opening of the lingula was now clear.  With saline only remaining blood clot was within the lingula and now patent.  Bleeding was controlled with diluted epinephrine and iced saline. The standard scope was then withdrawn and the endobronchial ultrasound was used to identify and characterize the peritracheal, hilar and bronchial lymph nodes. Inspection showed enlarged 10 AL and station 7.  Right-sided lymph nodes were all subcentimeter in size. Using real-time ultrasound guidance Wang needle biopsies were take from Station 10 L and station 7 nodes and were sent for cytology. The patient tolerated the procedure well without apparent complications. There was no significant blood loss.  At the conclusion of the procedure the standard bronchoscope was reintroduced into the airway and therapeutic suctioning of all remaining secretions and adherent blood clots from both bilateral airways were removed.  The bronchoscope was brought to just above the main carina and there was no evidence of active bleeding. The bronchoscope was withdrawn. Anesthesia was reversed and the patient was taken to the PACU for recovery.   Samples: 1.  Endobronchial biopsies of the left upper lobe lingula 2. Wang needle biopsies from station 10 L node 3. Wang needle biopsies from station 7 node  Plans:  The patient will be discharged from the PACU to home when recovered from anesthesia. We will review the cytology, pathology and microbiology results with the patient when they become available. Outpatient followup will be with Garner Nash, DO.   Garner Nash, DO [facility]  Critical Care 04/30/2019 9:51 AM

## 2019-04-30 NOTE — Anesthesia Procedure Notes (Addendum)
Procedure Name: Intubation Date/Time: 04/30/2019 8:26 AM Performed by: Orlie Dakin, CRNA Pre-anesthesia Checklist: Patient identified, Emergency Drugs available, Suction available and Patient being monitored Patient Re-evaluated:Patient Re-evaluated prior to induction Oxygen Delivery Method: Circle system utilized Preoxygenation: Pre-oxygenation with 100% oxygen Induction Type: IV induction Ventilation: Mask ventilation without difficulty and Oral airway inserted - appropriate to patient size Laryngoscope Size: Mac and 3 Grade View: Grade I Tube type: Oral Tube size: 8.5 mm Number of attempts: 1 Airway Equipment and Method: Stylet and Oral airway Placement Confirmation: ETT inserted through vocal cords under direct vision,  positive ETCO2 and breath sounds checked- equal and bilateral Secured at: 22 cm Tube secured with: Tape Dental Injury: Teeth and Oropharynx as per pre-operative assessment

## 2019-04-30 NOTE — Anesthesia Postprocedure Evaluation (Signed)
Anesthesia Post Note  Patient: Everleigh S Risser  Procedure(s) Performed: VIDEO BRONCHOSCOPY WITH ENDOBRONCHIAL ULTRASOUND (Left )     Patient location during evaluation: PACU Anesthesia Type: General Level of consciousness: awake and alert Pain management: pain level controlled Vital Signs Assessment: post-procedure vital signs reviewed and stable Respiratory status: spontaneous breathing, nonlabored ventilation and respiratory function stable Cardiovascular status: blood pressure returned to baseline and stable Postop Assessment: no apparent nausea or vomiting Anesthetic complications: no    Last Vitals:  Vitals:   04/30/19 1007 04/30/19 1022  BP: 128/72 128/81  Pulse: 79 80  Resp: 20 10  Temp:  (!) 36.1 C  SpO2: 96% 93%    Last Pain:  Vitals:   04/30/19 1022  PainSc: 0-No pain                 Audry Pili

## 2019-05-01 ENCOUNTER — Other Ambulatory Visit: Payer: Self-pay

## 2019-05-02 ENCOUNTER — Inpatient Hospital Stay: Payer: 59

## 2019-05-02 ENCOUNTER — Inpatient Hospital Stay (HOSPITAL_BASED_OUTPATIENT_CLINIC_OR_DEPARTMENT_OTHER): Payer: 59 | Admitting: Hematology

## 2019-05-02 ENCOUNTER — Encounter: Payer: Self-pay | Admitting: Hematology

## 2019-05-02 ENCOUNTER — Encounter: Payer: Self-pay | Admitting: *Deleted

## 2019-05-02 ENCOUNTER — Other Ambulatory Visit: Payer: Self-pay

## 2019-05-02 ENCOUNTER — Telehealth: Payer: Self-pay | Admitting: *Deleted

## 2019-05-02 VITALS — BP 157/84 | HR 70 | Temp 97.3°F | Resp 18 | Ht 64.0 in | Wt 149.0 lb

## 2019-05-02 DIAGNOSIS — Z79899 Other long term (current) drug therapy: Secondary | ICD-10-CM | POA: Insufficient documentation

## 2019-05-02 DIAGNOSIS — R7989 Other specified abnormal findings of blood chemistry: Secondary | ICD-10-CM

## 2019-05-02 DIAGNOSIS — Z87891 Personal history of nicotine dependence: Secondary | ICD-10-CM

## 2019-05-02 DIAGNOSIS — I10 Essential (primary) hypertension: Secondary | ICD-10-CM | POA: Insufficient documentation

## 2019-05-02 DIAGNOSIS — Z794 Long term (current) use of insulin: Secondary | ICD-10-CM

## 2019-05-02 DIAGNOSIS — Z833 Family history of diabetes mellitus: Secondary | ICD-10-CM | POA: Insufficient documentation

## 2019-05-02 DIAGNOSIS — C3492 Malignant neoplasm of unspecified part of left bronchus or lung: Secondary | ICD-10-CM | POA: Diagnosis present

## 2019-05-02 DIAGNOSIS — C3490 Malignant neoplasm of unspecified part of unspecified bronchus or lung: Secondary | ICD-10-CM

## 2019-05-02 DIAGNOSIS — C3412 Malignant neoplasm of upper lobe, left bronchus or lung: Secondary | ICD-10-CM

## 2019-05-02 DIAGNOSIS — Z9049 Acquired absence of other specified parts of digestive tract: Secondary | ICD-10-CM | POA: Insufficient documentation

## 2019-05-02 DIAGNOSIS — Z510 Encounter for antineoplastic radiation therapy: Secondary | ICD-10-CM | POA: Diagnosis not present

## 2019-05-02 DIAGNOSIS — Z807 Family history of other malignant neoplasms of lymphoid, hematopoietic and related tissues: Secondary | ICD-10-CM | POA: Insufficient documentation

## 2019-05-02 DIAGNOSIS — I4891 Unspecified atrial fibrillation: Secondary | ICD-10-CM | POA: Insufficient documentation

## 2019-05-02 DIAGNOSIS — E0590 Thyrotoxicosis, unspecified without thyrotoxic crisis or storm: Secondary | ICD-10-CM | POA: Insufficient documentation

## 2019-05-02 DIAGNOSIS — E119 Type 2 diabetes mellitus without complications: Secondary | ICD-10-CM

## 2019-05-02 DIAGNOSIS — R918 Other nonspecific abnormal finding of lung field: Secondary | ICD-10-CM

## 2019-05-02 LAB — SURGICAL PATHOLOGY

## 2019-05-02 LAB — CMP (CANCER CENTER ONLY)
ALT: 16 U/L (ref 0–44)
AST: 18 U/L (ref 15–41)
Albumin: 4.4 g/dL (ref 3.5–5.0)
Alkaline Phosphatase: 85 U/L (ref 38–126)
Anion gap: 8 (ref 5–15)
BUN: 17 mg/dL (ref 8–23)
CO2: 32 mmol/L (ref 22–32)
Calcium: 9.8 mg/dL (ref 8.9–10.3)
Chloride: 101 mmol/L (ref 98–111)
Creatinine: 0.7 mg/dL (ref 0.44–1.00)
GFR, Est AFR Am: >60 mL/min (ref >60)
GFR, Estimated: >60 mL/min (ref >60)
Glucose, Bld: 173 mg/dL — ABNORMAL HIGH (ref 70–99)
Potassium: 4.4 mmol/L (ref 3.5–5.1)
Sodium: 141 mmol/L (ref 135–145)
Total Bilirubin: 0.5 mg/dL (ref 0.3–1.2)
Total Protein: 7.3 g/dL (ref 6.5–8.1)

## 2019-05-02 LAB — CBC WITH DIFFERENTIAL (CANCER CENTER ONLY)
Abs Immature Granulocytes: 0.07 10*3/uL (ref 0.00–0.07)
Basophils Absolute: 0 10*3/uL (ref 0.0–0.1)
Basophils Relative: 0 %
Eosinophils Absolute: 0.3 10*3/uL (ref 0.0–0.5)
Eosinophils Relative: 2 %
HCT: 43.2 % (ref 36.0–46.0)
Hemoglobin: 14.3 g/dL (ref 12.0–15.0)
Immature Granulocytes: 1 %
Lymphocytes Relative: 37 %
Lymphs Abs: 3.9 10*3/uL (ref 0.7–4.0)
MCH: 30.3 pg (ref 26.0–34.0)
MCHC: 33.1 g/dL (ref 30.0–36.0)
MCV: 91.5 fL (ref 80.0–100.0)
Monocytes Absolute: 0.7 10*3/uL (ref 0.1–1.0)
Monocytes Relative: 7 %
Neutro Abs: 5.6 10*3/uL (ref 1.7–7.7)
Neutrophils Relative %: 53 %
Platelet Count: 243 10*3/uL (ref 150–400)
RBC: 4.72 MIL/uL (ref 3.87–5.11)
RDW: 13.1 % (ref 11.5–15.5)
WBC Count: 10.7 10*3/uL — ABNORMAL HIGH (ref 4.0–10.5)
nRBC: 0 % (ref 0.0–0.2)

## 2019-05-02 LAB — CYTOLOGY - NON PAP

## 2019-05-02 NOTE — Progress Notes (Signed)
Patient seen in the office today for a referral for elevated ferritin. Upon review of the chart it was found that patient recently had a biopsy of a lung nodule which showed Small Cell Lung Ca. Since patient lives in [place] and Dr Maylon Peppers will be leaving the practice in May, patient will be transferred to Dr Earlie Server at [facility]. Norton Blizzard, Lung Nurse Navigator notified of the transfer of care.  Dr Maylon Peppers went ahead and placed order for MRI, [place] and referral to [facility]. Initial consultation scheduled with RadOnc on 2/23 and MRI scheduled for 3/1.  Called patient and reviewed appointments with her. She is aware of appointments including time, date and location. She also states she had no metal implants or shaving exposure, and is not claustrophobic. She is also aware that the [facility] will reach out to her next week to schedule a new patient appointment with Dr Earlie Server.

## 2019-05-02 NOTE — Telephone Encounter (Signed)
Oncology Nurse Navigator Documentation  Oncology Nurse Navigator Flowsheets 05/02/2019  Abnormal Finding Date -  Confirmed Diagnosis Date -  Navigator Location [facility]  Referral Date to RadOnc/MedOnc 05/02/2019  Navigator Encounter Type Telephone;Other/I received call from Dr. Maylon Peppers and message from nurse navigator, Roselyn Reef.  Patient is referred to see Dr. Julien Nordmann and needs appt.  I called patient with an appt.  She verbalized understanding of appt time and place.   Telephone Outgoing Call  Treatment Phase Pre-Tx/Tx Discussion  Barriers/Navigation Needs Coordination of Care;Education  Education Other  Interventions Coordination of Care;Education  Acuity Level 2-Minimal Needs (1-2 Barriers Identified)  Referrals -  Coordination of Care Appts  Education Method Verbal  Time Spent with Patient 30

## 2019-05-04 ENCOUNTER — Other Ambulatory Visit: Payer: Self-pay | Admitting: Family

## 2019-05-04 ENCOUNTER — Other Ambulatory Visit: Payer: Self-pay | Admitting: Interventional Cardiology

## 2019-05-04 NOTE — Progress Notes (Signed)
Radiation Oncology         [phone] ________________________________  Initial Outpatient Consultation  Name: Christy Abbott MRN: [id number]  Date: 05/06/2019  DOB: Oct 08, 1955  CC:[name], [name], NP  Tish Men, MD   REFERRING PHYSICIAN: Tish Men, MD  DIAGNOSIS: The encounter diagnosis was Small cell lung cancer [facility]).  Stage IIB (cT1cN1M0) limited stage small cell carcinoma of the left lung (pending brain MRI)  HISTORY OF PRESENT ILLNESS::Christy Abbott is a 64 y.o. female who is accompanied by husband. The patient presented to the ED on 03/22/2019 with chief complaint of shortness of breath with associated chest pain. CTA of chest at that time showed multiple nodular opacities in both lungs, the largest of which measured 2.0 cm, and were concerning for malignancy. The patient was admitted to the hospital for further treatment. She was treated for sepsis, E. Coli, acute sigmoid diverticulitis, acute hypoxic respiratory failure, chest pain, unspecific atrial flutter, DKA, and hyperthyroidism and was discharged home on 03/28/2019.  PET scan on 04/22/2019 showed a 2.5 cm central left upper lobe/perihilar nodule, mildly progressive, suspicious for primary bronchogenic neoplasm. It also showed scattered small bilateral pulmonary nodules, most of which are mildly improved, favoring mild multifocal infection. No evidence of metastatic disease in the abdomen/pelvis.  The patient underwent video bronchoscopy with endobronchial ultrasound on 04/30/2019 performed by Dr. Valeta Harms. Pathology from the procedure showed small cell carcinoma of the left upper lobe. There were also malignant cells consistent with small cell carcinoma of lymph node 10L and atypical cell in lymph node 7.   The patient was seen by Dr. Maylon Peppers on 05/02/2019, during which time they discussed concurrent chemoradiation with Cisplatin and Etoposide.  Of note, the patient had an echocardiogram on 03/22/2019 that showed an EF of  60-65%. Also, the patient has a history of carcinoid tumor resected from stomach.  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   Hemochromatosis associated with compound heterozygous mutation in HFE gene ([place]) 12/10/2017   Hypertension    Hyperthyroidism 10/31/2014    PAST SURGICAL HISTORY: Past Surgical History:  Procedure Laterality Date   ABLATION  03-04-2013   CTI by Dr Lovena Le for atrial flutter   ATRIAL FLUTTER ABLATION N/A 03/04/2013   Procedure: ATRIAL FLUTTER ABLATION;  Surgeon: Evans Lance, MD;  Location: [facility] CATH LAB;  Service: Cardiovascular;  Laterality: N/A;   CHOLECYSTECTOMY  1982   COLONOSCOPY W/ POLYPECTOMY     x 2   ESOPHAGOGASTRODUODENOSCOPY N/A 05/20/2014   Procedure: ESOPHAGOGASTRODUODENOSCOPY (EGD);  Surgeon: Teena Irani, MD;  Location: [facility];  Service: Endoscopy;  Laterality: N/A;   TONSILLECTOMY  1972 ?   VIDEO BRONCHOSCOPY WITH ENDOBRONCHIAL ULTRASOUND Left 04/30/2019   Procedure: VIDEO BRONCHOSCOPY WITH ENDOBRONCHIAL ULTRASOUND;  Surgeon: Garner Nash, DO;  Location: [facility];  Service: Pulmonary;  Laterality: Left;    FAMILY HISTORY:  Family History  Problem Relation Age of Onset   Atrial fibrillation Mother    Arrhythmia Mother    Diabetes Mother    Hodgkin's lymphoma Father     SOCIAL HISTORY:  Social History   Tobacco Use   Smoking status: Former Smoker  Packs/day: 1.00    Types: Cigarettes    Quit date: 03/21/2019    Years since quitting: 0.1   Smokeless tobacco: Never Used   Tobacco comment: started age 74 , quit for a year many times  Substance Use Topics   Alcohol use: No    Alcohol/week: 0.0 standard drinks   Drug use: No     ALLERGIES:  Allergies  Allergen Reactions   Jardiance [Empagliflozin] Palpitations    Causes heart palpitations   Trazodone And Nefazodone Other (See Comments)    "hyped me up, could not sleep"    MEDICATIONS:  Current Outpatient Medications  Medication Sig Dispense Refill   acetaminophen (TYLENOL) 500 MG tablet Take 1,000 mg by mouth every 6 (six) hours as needed for moderate pain or headache.     Bioflavonoid Products (ESTER C PO) Take 1 tablet by mouth daily.     Calcium Carb-Cholecalciferol (CALTRATE 600+D3 PO) Take 1 tablet by mouth daily.     carvedilol (COREG) 12.5 MG tablet Take 1 tablet (12.5 mg total) by mouth in the morning and at bedtime. 180 tablet 3   diphenhydramine-acetaminophen (TYLENOL PM) 25-500 MG TABS tablet Take 2 tablets by mouth at bedtime.     flecainide (TAMBOCOR) 50 MG tablet TAKE 1 TABLET BY MOUTH TWICE DAILY, MAY TAKE AN ADDITIONAL 1 TABLET AS NEEDED. Please keep upcoming appt in January. Thank you (Patient taking differently: Take 50 mg by mouth See admin instructions. Take 50 mg by mouth twice daily, may take a third 50 mg dose as needed for palpitations) 270 tablet 0   furosemide (LASIX) 20 MG tablet Take 1 tablet (20 mg total) by mouth daily as needed. (Patient taking differently: Take 20 mg by mouth daily as needed for edema. ) 60 tablet 0   glimepiride (AMARYL) 4 MG tablet Take 1 tablet (4 mg total) by mouth daily with breakfast. 90 tablet 1   insulin glargine (LANTUS) 100 unit/mL SOPN Inject 0.1 mLs (10 Units total) into the skin daily. (Patient taking differently: Inject 10 Units into the skin at bedtime. ) 15 mL 2   Insulin Pen Needle (B-D UF III MINI PEN NEEDLES) 31G X 5 MM MISC Use with insulin pen 100 each 3   Lancets 30G MISC Check sugar twice daily. 100 each 3   lisinopril-hydrochlorothiazide (PRINZIDE,ZESTORETIC) 20-25 MG tablet Take 1 tablet by mouth daily. 90 tablet 1   metFORMIN (GLUCOPHAGE) 500 MG tablet Take 2 tablets  (1,000 mg total) by mouth 2 (two) times daily. 120 tablet 1   methimazole (TAPAZOLE) 10 MG tablet TAKE 1 & 1/2 (ONE & ONE-HALF) TABLETS BY MOUTH ONCE DAILY (Patient taking differently: Take 15 mg by mouth daily. ) 45 tablet 0   Omega-3 Fatty Acids (FISH OIL) 1200 MG CAPS Take 1,200 mg by mouth daily.      potassium chloride SA (KLOR-CON) 20 MEQ tablet TAKE 1  BY MOUTH ONCE DAILY (Patient taking differently: Take 20 mEq by mouth daily. ) 90 tablet 0   simvastatin (ZOCOR) 10 MG tablet TAKE 1 TABLET BY MOUTH AT BEDTIME (Patient taking differently: Take 10 mg by mouth at bedtime. ) 90 tablet 0   Tetrahydrozoline HCl (VISINE OP) Place 1 drop into both eyes daily as needed (irritation).     albuterol (VENTOLIN HFA) 108 (90 Base) MCG/ACT inhaler Inhale 2 puffs into the lungs every 6 (six) hours as needed for wheezing or shortness of breath. (Patient not taking: Reported on 04/25/2019) 6.7 g 0   guaiFENesin-dextromethorphan (  ROBITUSSIN DM) 100-10 MG/5ML syrup Take 5 mLs by mouth every 4 (four) hours as needed for cough. (Patient not taking: Reported on 05/02/2019) 118 mL 0   No current facility-administered medications for this encounter.    REVIEW OF SYSTEMS:  A 10+ POINT REVIEW OF SYSTEMS WAS OBTAINED including neurology, dermatology, psychiatry, cardiac, respiratory, lymph, extremities, GI, GU, musculoskeletal, constitutional, reproductive, HEENT.  sHe denies any respiratory symptoms whatsoever Patient reports that since the recent hospitalization, she has quit smoking for about a month.  She used to smoke 1 to 1.5 packs/day for least 30 years.  She does not drink or use illicit drugs.  She works as a Radiation protection practitioner.  She denies any constitutional symptoms, chest pain, dyspnea, or palpitation.    PHYSICAL EXAM:  weight is 151 lb 3.2 oz (68.6 kg). Her temperature is 98.7 F (37.1 C). Her blood pressure is 121/84 and her pulse is 83. Her respiration is 18 and oxygen saturation is 98%.   General: Alert  and oriented, in no acute distress HEENT: Head is normocephalic. Extraocular movements are intact.  Neck: Neck is supple, no palpable cervical or supraclavicular lymphadenopathy. Heart: Regular in rate and rhythm with no murmurs, rubs, or gallops. Chest: Clear to auscultation bilaterally, with no rhonchi, wheezes, or rales. Abdomen: Soft, nontender, nondistended, with no rigidity or guarding. Extremities: No cyanosis or edema. Lymphatics: see Neck Exam Skin: No concerning lesions. Musculoskeletal: symmetric strength and muscle tone throughout. Neurologic: Cranial nerves II through XII are grossly intact. No obvious focalities. Speech is fluent. Coordination is intact. Psychiatric: Judgment and insight are intact. Affect is appropriate.   ECOG = 1  0 - Asymptomatic (Fully active, able to carry on all predisease activities without restriction)  1 - Symptomatic but completely ambulatory (Restricted in physically strenuous activity but ambulatory and able to carry out work of a light or sedentary nature. For example, light housework, office work)  2 - Symptomatic, <50% in bed during the day (Ambulatory and capable of all self care but unable to carry out any work activities. Up and about more than 50% of waking hours)  3 - Symptomatic, >50% in bed, but not bedbound (Capable of only limited self-care, confined to bed or chair 50% or more of waking hours)  4 - Bedbound (Completely disabled. Cannot carry on any self-care. Totally confined to bed or chair)  5 - Death   Eustace Pen MM, Creech RH, Tormey DC, et al. [phone]). "Toxicity and response criteria of the [facility] Group". [place] Oncol. 5 (6): 649-55  LABORATORY DATA:  Lab Results  Component Value Date   WBC 10.7 (H) 05/02/2019   HGB 14.3 05/02/2019   HCT 43.2 05/02/2019   MCV 91.5 05/02/2019   PLT 243 05/02/2019   NEUTROABS 5.6 05/02/2019   Lab Results  Component Value Date   NA 141 05/02/2019   K 4.4  05/02/2019   CL 101 05/02/2019   CO2 32 05/02/2019   GLUCOSE 173 (H) 05/02/2019   CREATININE 0.70 05/02/2019   CALCIUM 9.8 05/02/2019      RADIOGRAPHY: DG Chest 2 View  Result Date: 04/09/2019 CLINICAL DATA:  Follow-up pneumonia EXAM: CHEST - 2 VIEW COMPARISON:  03/27/2019, 03/25/2019, CT 03/21/2018 FINDINGS: Clearing of previously noted bilateral pleural effusions and basilar airspace disease. Faint focus of slightly nodular opacity in the right upper lobe. Other lung nodules by CT are not well seen radiographically. Normal heart size. No pneumothorax. IMPRESSION: 1. Interval resolution of small pleural effusions and basilar  airspace disease. 2. Residual focus of nodularity and mild airspace disease in the right upper lobe, likely corresponding to the CT demonstrated possible pulmonary nodule for which CT follow-up was previously recommended, refer to 05/02/2019 CT chest report. Electronically Signed   By: Donavan Foil M.D.   On: 04/09/2019 15:53   NM PET Image Initial (PI) Skull Base To Thigh  Result Date: 04/22/2019 CLINICAL DATA:  Initial treatment strategy for multiple pulmonary nodules. EXAM: NUCLEAR MEDICINE PET SKULL BASE TO THIGH TECHNIQUE: 7.1 mCi F-18 FDG was injected intravenously. Full-ring PET imaging was performed from the skull base to thigh after the radiotracer. CT data was obtained and used for attenuation correction and anatomic localization. Fasting blood glucose: 145 mg/dl COMPARISON:  CTA chest dated 03/22/2019 FINDINGS: Mediastinal blood pool activity: SUV max 2.7 Liver activity: SUV max NA NECK: No hypermetabolic cervical lymphadenopathy. Incidental CT findings: none CHEST: 2.5 cm central left upper lobe/perihilar nodule (series 8/image 33), previously 2.1 cm, max SUV 7.5. This is suspicious for primary bronchogenic neoplasm. 12 mm nodular opacity in the posterior right upper lobe (series 8/image 20), previously 15 mm, max SUV 1.4. 7 mm subpleural nodule in the posterior right  lower lobe (series 8/image 29) and additional 10 mm bilobed subpleural nodule in the posterior right lower lobe (series 8/image 39), previously 8 and 10 mm respectively, max SUV 2.7. 7 mm posterior left lower lobe nodule (series 8/image 47), previously 12 mm. Additional scattered small bilateral pulmonary nodules. These findings are indeterminate but favor multifocal infection. Lingular atelectasis, mildly progressive. Mild patchy opacity in the medial left upper lobe (series 8/image 29), unchanged. No pleural effusion or pneumothorax. No hypermetabolic thoracic lymphadenopathy. Incidental CT findings: Atherosclerotic calcifications of the aortic arch. Mild coronary atherosclerosis of the LAD. ABDOMEN/PELVIS: No hypermetabolic abdominopelvic lymphadenopathy. No abnormal hypermetabolism in the liver, spleen, pancreas, or adrenal glands. Incidental CT findings: Status post cholecystectomy. Atherosclerotic calcifications the abdominal aorta and branch vessels. SKELETON: No focal hypermetabolic activity to suggest skeletal metastasis. Incidental CT findings: Degenerative changes of the visualized thoracolumbar spine. IMPRESSION: 2.5 cm central left upper lobe/perihilar nodule, mildly progressive, suspicious for primary bronchogenic neoplasm. Scattered small bilateral pulmonary nodules, most of which are mildly improved, favoring mild multifocal infection. No evidence of metastatic disease in the abdomen/pelvis. Electronically Signed   By: Julian Hy M.D.   On: 04/22/2019 14:46      IMPRESSION: Stage IIB (cT1cN1M0) limited stage small cell carcinoma of the left lung  Patient will be a good candidate for definitive course of radiation therapy along with radiosensitizing chemotherapy.  Patient will proceed with an MRI of the brain later this week to complete her staging work-up.  After she has completed her i course of radiation chemotherapy assuming she has a complete response then she would be a candidate for  prophylactic cranial irradiation.  I did discuss this with the patient today.  Today, I talked to the patient and husband about the findings and work-up thus far.  We discussed the natural history of lung cancer (small cell, limited stage) and general treatment, highlighting the role of radiotherapy in the management.  We discussed the available radiation techniques, and focused on the details of logistics and delivery.  We reviewed the anticipated acute and late sequelae associated with radiation in this setting.  The patient was encouraged to ask questions that I answered to the best of my ability.  A patient consent form was discussed and signed.  We retained a copy for our records.  The  patient would like to proceed with radiation and will be scheduled for CT simulation.  PLAN: Patient is scheduled for MRI of brain on 05/12/2019.  Chest simulation is scheduled for February 25 with treatments to begin the first week in March.  Anticipate 6 weeks of radiation therapy.  Patient will meet with Dr. Julien Nordmann on February 25 in addition.  Total time spent in this encounter was 65 minutes which included detailed physical examination, review of the patient's imaging studies, path report, coordination of upcoming radiation planning and documentation. ------------------------------------------------  Blair Promise, PhD, MD  This document serves as a record of services personally performed by Gery Pray, MD. It was created on his behalf by Clerance Lav, a trained medical scribe. The creation of this record is based on the scribe's personal observations and the provider's statements to them. This document has been checked and approved by the attending provider.

## 2019-05-05 LAB — IRON AND TIBC
Iron: 104 ug/dL (ref 41–142)
Saturation Ratios: 30 % (ref 21–57)
TIBC: 346 ug/dL (ref 236–444)
UIBC: 242 ug/dL (ref 120–384)

## 2019-05-05 LAB — FERRITIN: Ferritin: 596 ng/mL — ABNORMAL HIGH (ref 11–307)

## 2019-05-06 ENCOUNTER — Ambulatory Visit
Admission: RE | Admit: 2019-05-06 | Discharge: 2019-05-06 | Disposition: A | Discharge status: Home / Self Care | Payer: 59 | Source: Ambulatory Visit | Attending: Radiation Oncology | Admitting: Radiation Oncology

## 2019-05-06 ENCOUNTER — Encounter: Payer: Self-pay | Admitting: Radiation Oncology

## 2019-05-06 ENCOUNTER — Other Ambulatory Visit: Payer: Self-pay

## 2019-05-06 VITALS — BP 121/84 | HR 83 | Temp 98.7°F | Resp 18 | Wt 151.2 lb

## 2019-05-06 DIAGNOSIS — E0590 Thyrotoxicosis, unspecified without thyrotoxic crisis or storm: Secondary | ICD-10-CM | POA: Insufficient documentation

## 2019-05-06 DIAGNOSIS — I10 Essential (primary) hypertension: Secondary | ICD-10-CM | POA: Insufficient documentation

## 2019-05-06 DIAGNOSIS — C3490 Malignant neoplasm of unspecified part of unspecified bronchus or lung: Secondary | ICD-10-CM

## 2019-05-06 DIAGNOSIS — C779 Secondary and unspecified malignant neoplasm of lymph node, unspecified: Secondary | ICD-10-CM | POA: Insufficient documentation

## 2019-05-06 DIAGNOSIS — Z87891 Personal history of nicotine dependence: Secondary | ICD-10-CM | POA: Insufficient documentation

## 2019-05-06 DIAGNOSIS — Z794 Long term (current) use of insulin: Secondary | ICD-10-CM | POA: Insufficient documentation

## 2019-05-06 DIAGNOSIS — M129 Arthropathy, unspecified: Secondary | ICD-10-CM | POA: Insufficient documentation

## 2019-05-06 DIAGNOSIS — E119 Type 2 diabetes mellitus without complications: Secondary | ICD-10-CM | POA: Diagnosis not present

## 2019-05-06 DIAGNOSIS — Z79899 Other long term (current) drug therapy: Secondary | ICD-10-CM | POA: Insufficient documentation

## 2019-05-06 DIAGNOSIS — C3412 Malignant neoplasm of upper lobe, left bronchus or lung: Secondary | ICD-10-CM | POA: Diagnosis present

## 2019-05-06 NOTE — Progress Notes (Signed)
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 female who is accompanied by husband. The patient presented to the ED on 03/22/2019 with chief complaint of shortness of breath with associated chest pain. CTA of chest at that time showed multiple nodular opacities in both lungs, the largest of which measured 2.0 cm, and were concerning for malignancy. The patient was admitted to the hospital for further treatment. She was treated for sepsis, E. Coli, acute sigmoid diverticulitis, acute hypoxic respiratory failure, chest pain, unspecific atrial flutter, DKA, and hyperthyroidism and was discharged home on 03/28/2019.  PET scan on 04/22/2019 showed a 2.5 cm central left upper lobe/perihilar nodule, mildly progressive, suspicious for primary bronchogenic neoplasm. It also showed scattered small bilateral pulmonary nodules, most of which are mildly improved, favoring mild multifocal infection. No evidence of metastatic disease in the abdomen/pelvis.  The patient underwent video bronchoscopy with endobronchial ultrasound on 04/30/2019 performed by Dr. Valeta Harms. Pathology from the procedure showed small cell carcinoma of the left upper lobe. There were also malignant cells consistent with small cell carcinoma of lymph node 10L and atypical cell in lymph node 7.   The patient was seen by Dr. Maylon Peppers on 05/02/2019, during which time they discussed concurrent chemoradiation with Cisplatin and Etoposide.  Of note, the patient had an echocardiogram on 03/22/2019 that showed an EF of 60-65%. Also, the patient has a history of carcinoid tumor resected from stomach.  Tobacco/Marijuana/Snuff/ETOH use:  Tobacco Use  . Smoking status: Former Smoker    Packs/day: 1.00    Types: Cigarettes    Quit date: 03/21/2019    Years since quitting: 0.0  . Smokeless tobacco: Never Used  Substance and Sexual Activity  . Alcohol use: No     Alcohol/week: 0.0 standard drinks  . Drug use: No  . Sexual activity: Not on file       Past/Anticipated interventions by medical oncology, if any: Initial consult with Dr. Julien Nordmann scheduled 05/08/19  Signs/Symptoms  Weight changes, if any: denies  Respiratory complaints, if any: denies cough, denies SOB  Hemoptysis, if any: denies  Pain issues, if any:  Denies c/o pain.  SAFETY ISSUES:  Prior radiation? no  Pacemaker/ICD? no   Possible current pregnancy?no  Is the patient on methotrexate? no  Current Complaints / other details:  Pt presents today for initial consult with Dr. Sondra Come for Radiation Oncology. Pt is accompanied by husband.   BP 121/84 (BP Location: Right Arm, Patient Position: Sitting, Cuff Size: Normal)   Pulse 83   Temp 98.7 F (37.1 C)   Resp 18   Wt 151 lb 3.2 oz (68.6 kg)   SpO2 98%   BMI 25.95 kg/m  Wt Readings from Last 3 Encounters:  05/06/19 151 lb 3.2 oz (68.6 kg)  05/02/19 149 lb 0.6 oz (67.6 kg)  04/30/19 140 lb (63.5 kg)   Christy Sousa, RN BSN

## 2019-05-06 NOTE — Patient Instructions (Signed)
Coronavirus (COVID-19) Are you at risk?  Are you at risk for the Coronavirus (COVID-19)?  To be considered HIGH RISK for Coronavirus (COVID-19), you have to meet the following criteria:  . Traveled to [place], [place], [place], [place] or [place]; or in the [place] to [place], [place], [place], or [place]; and have fever, cough, and shortness of breath within the last 2 weeks of travel OR . Been in close contact with a person diagnosed with COVID-19 within the last 2 weeks and have fever, cough, and shortness of breath . IF YOU DO NOT MEET THESE CRITERIA, YOU ARE CONSIDERED LOW RISK FOR COVID-19.  What to do if you are HIGH RISK for COVID-19?  . If you are having a medical emergency, call 911. . Seek medical care right away. Before you go to a doctor's office, urgent care or emergency department, call ahead and tell them about your recent travel, contact with someone diagnosed with COVID-19, and your symptoms. You should receive instructions from your physician's office regarding next steps of care.  . When you arrive at healthcare provider, tell the healthcare staff immediately you have returned from visiting [place], [place], [place], [place] or [place]; or traveled in the [place] to [place], [place], [place], or [place]; in the last two weeks or you have been in close contact with a person diagnosed with COVID-19 in the last 2 weeks.   . Tell the health care staff about your symptoms: fever, cough and shortness of breath. . After you have been seen by a medical provider, you will be either: o Tested for (COVID-19) and discharged home on quarantine except to seek medical care if symptoms worsen, and asked to  - Stay home and avoid contact with others until you get your results (4-5 days)  - Avoid travel on public transportation if possible (such as bus, train, or airplane) or o Sent to the Emergency Department by EMS for evaluation, COVID-19 testing, and possible  admission depending on your condition and test results.  What to do if you are LOW RISK for COVID-19?  Reduce your risk of any infection by using the same precautions used for avoiding the common cold or flu:  . Wash your hands often with soap and warm water for at least 20 seconds.  If soap and water are not readily available, use an alcohol-based hand sanitizer with at least 60% alcohol.  . If coughing or sneezing, cover your mouth and nose by coughing or sneezing into the elbow areas of your shirt or coat, into a tissue or into your sleeve (not your hands). . Avoid shaking hands with others and consider head nods or verbal greetings only. . Avoid touching your eyes, nose, or mouth with unwashed hands.  . Avoid close contact with people who are sick. . Avoid places or events with large numbers of people in one location, like concerts or sporting events. . Carefully consider travel plans you have or are making. . If you are planning any travel outside or inside the [place], visit the CDC's Travelers' Health webpage for the latest health notices. . If you have some symptoms but not all symptoms, continue to monitor at home and seek medical attention if your symptoms worsen. . If you are having a medical emergency, call 911.   ADDITIONAL HEALTHCARE OPTIONS FOR PATIENTS  [facility] / e-Visit: [email or web address]         [facility]: [phone]  [facility]: [phone]                   [facility]: [phone]   

## 2019-05-07 NOTE — Progress Notes (Signed)
  Radiation Oncology         [phone] ________________________________  Name: Christy Abbott MRN: [id number]  Date: 05/08/2019  DOB: Aug 01, 1955  SIMULATION AND TREATMENT PLANNING NOTE    ICD-10-CM   1. Primary lung small cell carcinoma, left (HCC)  C34.92     DIAGNOSIS:  Stage IIB (cT1cN1M0) limited stage small cell carcinoma of the left lung (pending brain MRI)  NARRATIVE:  The patient was brought to the [facility].  Identity was confirmed.  All relevant records and images related to the planned course of therapy were reviewed.  The patient freely provided informed written consent to proceed with treatment after reviewing the details related to the planned course of therapy. The consent form was witnessed and verified by the simulation staff.  Then, the patient was set-up in a stable reproducible  supine position for radiation therapy.  CT images were obtained.  Surface markings were placed.  The CT images were loaded into the planning software.  Then the target and avoidance structures were contoured.  Treatment planning then occurred.  The radiation prescription was entered and confirmed.  Then, I designed and supervised the construction of a total of 5 medically necessary complex treatment devices.  I have requested : 3D Simulation  I have requested a DVH of the following structures: GTV, PTV, heart, lungs, esophagus, spinal cord.  I have ordered:dose calc.  PLAN:  The patient will receive 60 Gy in 30 fractions directed at the left perihilar mass along with radiosensitizing chemotherapy.   Special Treatment Procedure Note: The patient will be receiving radiosensitizing chemotherapy. Given the potential of increased toxicities related to combined therapy and the necessity for close monitoring of the patient and blood work, this constitutes a special treatment procedure.  -----------------------------------  Blair Promise, PhD, MD  This document serves as a record  of services personally performed by Gery Pray, MD. It was created on his behalf by Wilburn Mylar, a trained medical scribe. The creation of this record is based on the scribe's personal observations and the provider's statements to them. This document has been checked and approved by the attending provider.

## 2019-05-08 ENCOUNTER — Other Ambulatory Visit: Payer: Self-pay | Admitting: Radiology

## 2019-05-08 ENCOUNTER — Encounter: Payer: Self-pay | Admitting: Internal Medicine

## 2019-05-08 ENCOUNTER — Other Ambulatory Visit: Payer: Self-pay | Admitting: *Deleted

## 2019-05-08 ENCOUNTER — Inpatient Hospital Stay: Payer: 59

## 2019-05-08 ENCOUNTER — Encounter: Payer: Self-pay | Admitting: *Deleted

## 2019-05-08 ENCOUNTER — Other Ambulatory Visit: Payer: Self-pay

## 2019-05-08 ENCOUNTER — Inpatient Hospital Stay (HOSPITAL_BASED_OUTPATIENT_CLINIC_OR_DEPARTMENT_OTHER): Payer: 59 | Admitting: Internal Medicine

## 2019-05-08 ENCOUNTER — Ambulatory Visit
Admission: RE | Admit: 2019-05-08 | Discharge: 2019-05-08 | Disposition: A | Discharge status: Home / Self Care | Payer: 59 | Source: Ambulatory Visit | Attending: Radiation Oncology | Admitting: Radiation Oncology

## 2019-05-08 VITALS — BP 133/82 | HR 88 | Temp 99.1°F | Resp 18 | Ht 64.0 in | Wt 154.5 lb

## 2019-05-08 DIAGNOSIS — E0590 Thyrotoxicosis, unspecified without thyrotoxic crisis or storm: Secondary | ICD-10-CM

## 2019-05-08 DIAGNOSIS — I4891 Unspecified atrial fibrillation: Secondary | ICD-10-CM | POA: Insufficient documentation

## 2019-05-08 DIAGNOSIS — Z9049 Acquired absence of other specified parts of digestive tract: Secondary | ICD-10-CM | POA: Insufficient documentation

## 2019-05-08 DIAGNOSIS — Z87891 Personal history of nicotine dependence: Secondary | ICD-10-CM | POA: Insufficient documentation

## 2019-05-08 DIAGNOSIS — R7989 Other specified abnormal findings of blood chemistry: Secondary | ICD-10-CM | POA: Diagnosis not present

## 2019-05-08 DIAGNOSIS — Z833 Family history of diabetes mellitus: Secondary | ICD-10-CM

## 2019-05-08 DIAGNOSIS — I10 Essential (primary) hypertension: Secondary | ICD-10-CM

## 2019-05-08 DIAGNOSIS — Z807 Family history of other malignant neoplasms of lymphoid, hematopoietic and related tissues: Secondary | ICD-10-CM | POA: Insufficient documentation

## 2019-05-08 DIAGNOSIS — Z794 Long term (current) use of insulin: Secondary | ICD-10-CM

## 2019-05-08 DIAGNOSIS — C3412 Malignant neoplasm of upper lobe, left bronchus or lung: Secondary | ICD-10-CM | POA: Diagnosis not present

## 2019-05-08 DIAGNOSIS — Z510 Encounter for antineoplastic radiation therapy: Secondary | ICD-10-CM | POA: Diagnosis not present

## 2019-05-08 DIAGNOSIS — E119 Type 2 diabetes mellitus without complications: Secondary | ICD-10-CM

## 2019-05-08 DIAGNOSIS — Z7189 Other specified counseling: Secondary | ICD-10-CM | POA: Insufficient documentation

## 2019-05-08 DIAGNOSIS — Z79899 Other long term (current) drug therapy: Secondary | ICD-10-CM

## 2019-05-08 DIAGNOSIS — C3490 Malignant neoplasm of unspecified part of unspecified bronchus or lung: Secondary | ICD-10-CM

## 2019-05-08 DIAGNOSIS — C3492 Malignant neoplasm of unspecified part of left bronchus or lung: Secondary | ICD-10-CM

## 2019-05-08 DIAGNOSIS — Z5111 Encounter for antineoplastic chemotherapy: Secondary | ICD-10-CM | POA: Insufficient documentation

## 2019-05-08 LAB — CMP (CANCER CENTER ONLY)
ALT: 15 U/L (ref 0–44)
AST: 12 U/L — ABNORMAL LOW (ref 15–41)
Albumin: 3.4 g/dL — ABNORMAL LOW (ref 3.5–5.0)
Alkaline Phosphatase: 107 U/L (ref 38–126)
Anion gap: 9 (ref 5–15)
BUN: 10 mg/dL (ref 8–23)
CO2: 31 mmol/L (ref 22–32)
Calcium: 9.6 mg/dL (ref 8.9–10.3)
Chloride: 103 mmol/L (ref 98–111)
Creatinine: 0.77 mg/dL (ref 0.44–1.00)
GFR, Est AFR Am: >60 mL/min (ref >60)
GFR, Estimated: >60 mL/min (ref >60)
Glucose, Bld: 272 mg/dL — ABNORMAL HIGH (ref 70–99)
Potassium: 4.4 mmol/L (ref 3.5–5.1)
Sodium: 143 mmol/L (ref 135–145)
Total Bilirubin: 0.4 mg/dL (ref 0.3–1.2)
Total Protein: 6.7 g/dL (ref 6.5–8.1)

## 2019-05-08 LAB — CBC WITH DIFFERENTIAL (CANCER CENTER ONLY)
Abs Immature Granulocytes: 0.01 10*3/uL (ref 0.00–0.07)
Basophils Absolute: 0 10*3/uL (ref 0.0–0.1)
Basophils Relative: 1 %
Eosinophils Absolute: 0.4 10*3/uL (ref 0.0–0.5)
Eosinophils Relative: 5 %
HCT: 40.8 % (ref 36.0–46.0)
Hemoglobin: 13.6 g/dL (ref 12.0–15.0)
Immature Granulocytes: 0 %
Lymphocytes Relative: 41 %
Lymphs Abs: 3.3 10*3/uL (ref 0.7–4.0)
MCH: 30.2 pg (ref 26.0–34.0)
MCHC: 33.3 g/dL (ref 30.0–36.0)
MCV: 90.5 fL (ref 80.0–100.0)
Monocytes Absolute: 0.5 10*3/uL (ref 0.1–1.0)
Monocytes Relative: 6 %
Neutro Abs: 3.8 10*3/uL (ref 1.7–7.7)
Neutrophils Relative %: 47 %
Platelet Count: 243 10*3/uL (ref 150–400)
RBC: 4.51 MIL/uL (ref 3.87–5.11)
RDW: 13.2 % (ref 11.5–15.5)
WBC Count: 8.1 10*3/uL (ref 4.0–10.5)
nRBC: 0 % (ref 0.0–0.2)

## 2019-05-08 NOTE — Progress Notes (Signed)
START ON PATHWAY REGIMEN - Small Cell Lung     A cycle is every 21 days:     Etoposide      Cisplatin   **Always confirm dose/schedule in your pharmacy ordering system**  Patient Characteristics: Newly Diagnosed, Preoperative or Nonsurgical Candidate (Clinical Staging), First Line, Limited Stage, Nonsurgical Candidate Therapeutic Status: Newly Diagnosed, Preoperative or Nonsurgical Candidate (Clinical Staging) AJCC T Category: cT2a AJCC N Category: cN1 AJCC M Category: cM0 AJCC 8 Stage Grouping: IIB Stage Classification: Limited Surgical Candidacy: Nonsurgical Candidate Intent of Therapy: Curative Intent, Discussed with Patient

## 2019-05-08 NOTE — Progress Notes (Signed)
[name] [phone]   Fax:([phone]) [phone]  CONSULT NOTE  REFERRING PHYSICIAN: Dr. Leory Plowman Icard  REASON FOR CONSULTATION:  64 years old white female recently diagnosed with lung cancer.  HPI Christy Abbott is a 64 y.o. female with past medical history significant for osteoarthritis, diabetes mellitus, hypertension, hypothyroidism, history of gastric neuroendocrine tumor status post resection 8 years ago, atrial fibrillation as well as long history for smoking.  The patient was admitted to the hospital in early January 2021 with diabetic ketoacidosis and during her evaluation she had CT angiogram of the chest on March 22, 2019 and it showed multiple nodular opacities in both lungs the largest of which measured 2.0 cm concerning for malignancy.  CT of the abdomen and pelvis performed on March 24, 2019 showed no evidence of metastatic disease in the abdomen or pelvis.  The patient had a PET scan on 04/22/2019 and showed 2.5 cm central left upper lobe/perihilar nodule with maximum SUV of 7.5 suspicious for primary bronchogenic neoplasm.  There was also 1.2 cm nodular opacity in the posterior right upper lobe and 0.7 cm subpleural nodule in the posterior right lower lobe in addition to 1.0 cm bilobed subpleural nodule in the posterior right lower lobe with low SUV improved from previous CT images a month earlier suspicious for multifocal infection.  There was no evidence of metastatic disease in the abdomen or pelvis.  On April 30, 2019 the patient underwent video bronchoscopy with endobronchial ultrasound procedure by Dr. Valeta Harms.  The lingula was totally occluded by tumor protruding into the main airway.  Biopsies from the left upper lobe lingula as well as station 10 L and 7 lymph nodes were performed. The final pathology ([id number]) was consistent with a small cell carcinoma. Dr. Valeta Harms kindly referred the patient to me today for evaluation and recommendation  regarding treatment of her condition.  She was also seen earlier today by Dr. Sondra Come former radiation oncology. When seen today the patient is feeling fine with no concerning complaints.  She denied having any current chest pain, shortness of breath, cough or hemoptysis.  She denied having any fever or chills.  She has no nausea, vomiting, diarrhea or constipation.  She has no headache or visual changes.  She is scheduled to have MRI of the brain on May 12, 2019. Family history significant for mother died at age 61 and had lung cancer.  Father had Hodgkin's lymphoma when she was 64 years old. The patient is married and has 1 daughter.  She works as a Radiation protection practitioner.  She was accompanied today by her husband Scientist, physiological.  The patient has a history of smoking 1.5 pack/day for around 50 years and quit March 21, 2019.  No history of alcohol or drug abuse.   Past Medical History:  Diagnosis Date  . Arthritis   . Atrial flutter [facility])    s/p CTI by Dr Lovena Le 02/2013  . Chest pain   . Diabetes mellitus without complication ([place])   . Dysrhythmia    hx of Aflutter  . 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 Hemochromatosis associated with compound heterozygous mutation in HFE gene ([place]) 12/10/2017  . Hypertension   . Hyperthyroidism 10/31/2014    Past Surgical History:  Procedure Laterality Date  . ABLATION  03-04-2013   CTI by Dr Lovena Le for atrial flutter  . ATRIAL FLUTTER ABLATION N/A 03/04/2013   Procedure: ATRIAL FLUTTER ABLATION;  Surgeon: Evans Lance, MD;  Location: [place] CATH LAB;  Service: Cardiovascular;  Laterality: N/A;  . CHOLECYSTECTOMY  1982  . COLONOSCOPY W/ POLYPECTOMY     x 2  . ESOPHAGOGASTRODUODENOSCOPY N/A 05/20/2014   Procedure: ESOPHAGOGASTRODUODENOSCOPY (EGD);  Surgeon: Teena Irani, MD;  Location: [facility];  Service: Endoscopy;  Laterality: N/A;  . TONSILLECTOMY  1972 ?  Marland Kitchen VIDEO BRONCHOSCOPY WITH ENDOBRONCHIAL ULTRASOUND Left 04/30/2019   Procedure: VIDEO  BRONCHOSCOPY WITH ENDOBRONCHIAL ULTRASOUND;  Surgeon: Garner Nash, DO;  Location: [place];  Service: Pulmonary;  Laterality: Left;    Family History  Problem Relation Age of Onset  . Atrial fibrillation Mother   . Arrhythmia Mother   . Diabetes Mother   . Hodgkin's lymphoma Father     Social History Social History   Tobacco Use  . Smoking status: Former Smoker    Packs/day: 1.00    Types: Cigarettes    Quit date: 03/21/2019    Years since quitting: 0.1  . Smokeless tobacco: Never Used  . Tobacco comment: started age 50 , quit for a year many times  Substance Use Topics  . Alcohol use: No    Alcohol/week: 0.0 standard drinks  . Drug use: No    Allergies  Allergen Reactions  . Jardiance [Empagliflozin] Palpitations    Causes heart palpitations  . Trazodone And Nefazodone Other (See Comments)    "hyped me up, could not sleep"    Current Outpatient Medications  Medication Sig Dispense Refill  . acetaminophen (TYLENOL) 500 MG tablet Take 1,000 mg by mouth every 6 (six) hours as needed for moderate pain or headache.    Marland Kitchen Bioflavonoid Products (ESTER C PO) Take 1 tablet by mouth daily.    . Calcium Carb-Cholecalciferol (CALTRATE 600+D3 PO) Take 1 tablet by mouth daily.    . carvedilol (COREG) 12.5 MG tablet Take 1 tablet (12.5 mg total) by mouth in the morning and at bedtime. 180 tablet 3  . diphenhydramine-acetaminophen (TYLENOL PM) 25-500 MG TABS tablet Take 2 tablets by mouth at bedtime.    . flecainide (TAMBOCOR) 50 MG tablet TAKE 1 TABLET BY MOUTH TWICE DAILY, MAY TAKE AN ADDITIONAL 1 TABLET AS NEEDED. Please keep upcoming appt in January. Thank you (Patient taking differently: Take 50 mg by mouth See admin instructions. Take 50 mg by mouth twice daily, may take a third 50 mg dose as needed for palpitations) 270 tablet 0  . glimepiride (AMARYL) 4 MG tablet Take 1 tablet (4 mg total) by mouth daily with breakfast. 90 tablet 1  . insulin glargine (LANTUS) 100 unit/mL SOPN  Inject 0.1 mLs (10 Units total) into the skin daily. (Patient taking differently: Inject 10 Units into the skin at bedtime. ) 15 mL 2  . Insulin Pen Needle (B-D UF III MINI PEN NEEDLES) 31G X 5 MM MISC Use with insulin pen 100 each 3  . Lancets 30G MISC Check sugar twice daily. 100 each 3  . lisinopril-hydrochlorothiazide (PRINZIDE,ZESTORETIC) 20-25 MG tablet Take 1 tablet by mouth daily. 90 tablet 1  . metFORMIN (GLUCOPHAGE) 500 MG tablet Take 2 tablets (1,000 mg total) by mouth 2 (two) times daily. 120 tablet 1  . methimazole (TAPAZOLE) 10 MG tablet TAKE 1 & 1/2 (ONE & ONE-HALF) TABLETS BY MOUTH ONCE DAILY (Patient taking differently: Take 15 mg by mouth daily. ) 45 tablet 0  . Omega-3 Fatty Acids (FISH OIL) 1200 MG CAPS Take 1,200 mg by mouth daily.     . potassium chloride SA (KLOR-CON) 20 MEQ tablet TAKE 1  BY MOUTH ONCE DAILY (Patient taking differently: Take 20 mEq by mouth daily. ) 90 tablet 0  . simvastatin (ZOCOR) 10 MG tablet TAKE 1 TABLET BY MOUTH AT BEDTIME (Patient taking differently: Take 10 mg by mouth at bedtime. ) 90 tablet 0  . Tetrahydrozoline HCl (VISINE OP) Place 1 drop into both eyes daily as needed (irritation).    Marland Kitchen albuterol (VENTOLIN HFA) 108 (90 Base) MCG/ACT inhaler Inhale 2 puffs into the lungs every 6 (six) hours as needed for wheezing or shortness of breath. (Patient not taking: Reported on 04/25/2019) 6.7 g 0  . furosemide (LASIX) 20 MG tablet Take 1 tablet (20 mg total) by mouth daily as needed. (Patient not taking: Reported on 05/08/2019) 60 tablet 0  . guaiFENesin-dextromethorphan (ROBITUSSIN DM) 100-10 MG/5ML syrup Take 5 mLs by mouth every 4 (four) hours as needed for cough. (Patient not taking: Reported on 05/02/2019) 118 mL 0   No current facility-administered medications for this visit.    Review of Systems  Constitutional: negative Eyes: negative Ears, nose, mouth, throat, and face: negative Respiratory: negative Cardiovascular:  negative Gastrointestinal: negative Genitourinary:negative Integument/breast: negative Hematologic/lymphatic: negative Musculoskeletal:negative Neurological: negative Behavioral/Psych: negative Endocrine: negative Allergic/Immunologic: negative  Physical Exam  [id number], healthy, no distress, well nourished and well developed SKIN: skin color, texture, turgor are normal, no rashes or significant lesions HEAD: Normocephalic, No masses, lesions, tenderness or abnormalities EYES: normal, PERRLA, Conjunctiva are pink and non-injected EARS: External ears normal OROPHARYNX:no exudate, no erythema and lips, buccal mucosa, and tongue normal  NECK: supple, no adenopathy, no JVD LYMPH:  no palpable lymphadenopathy, no hepatosplenomegaly BREAST:not examined LUNGS: clear to auscultation , and palpation HEART: regular rate & rhythm, no murmurs and no gallops ABDOMEN:abdomen soft, non-tender, normal bowel sounds and no masses or organomegaly BACK: No CVA tenderness, Range of motion is normal EXTREMITIES:no joint deformities, effusion, or inflammation, no edema  NEURO: alert & oriented x 3 with fluent speech, no focal motor/sensory deficits  PERFORMANCE STATUS: ECOG 1  LABORATORY DATA: Lab Results  Component Value Date   WBC 8.1 05/08/2019   HGB 13.6 05/08/2019   HCT 40.8 05/08/2019   MCV 90.5 05/08/2019   PLT 243 05/08/2019      Chemistry      Component Value Date/Time   NA 141 05/02/2019 1210   NA 140 02/21/2018 0955   K 4.4 05/02/2019 1210   CL 101 05/02/2019 1210   CO2 32 05/02/2019 1210   BUN 17 05/02/2019 1210   BUN 14 02/21/2018 0955   CREATININE 0.70 05/02/2019 1210   CREATININE 0.82 01/22/2015 1556      Component Value Date/Time   CALCIUM 9.8 05/02/2019 1210   ALKPHOS 85 05/02/2019 1210   AST 18 05/02/2019 1210   ALT 16 05/02/2019 1210   BILITOT 0.5 05/02/2019 1210       RADIOGRAPHIC STUDIES: DG Chest 2 View  Result Date: 04/09/2019 CLINICAL DATA:   Follow-up pneumonia EXAM: CHEST - 2 VIEW COMPARISON:  03/27/2019, 03/25/2019, CT 03/21/2018 FINDINGS: Clearing of previously noted bilateral pleural effusions and basilar airspace disease. Faint focus of slightly nodular opacity in the right upper lobe. Other lung nodules by CT are not well seen radiographically. Normal heart size. No pneumothorax. IMPRESSION: 1. Interval resolution of small pleural effusions and basilar airspace disease. 2. Residual focus of nodularity and mild airspace disease in the right upper lobe, likely corresponding to the CT demonstrated possible pulmonary nodule for which CT follow-up was previously recommended, refer to 05/02/2019 CT chest report.  Electronically Signed   By: Donavan Foil M.D.   On: 04/09/2019 15:53   NM PET Image Initial (PI) Skull Base To Thigh  Result Date: 04/22/2019 CLINICAL DATA:  Initial treatment strategy for multiple pulmonary nodules. EXAM: NUCLEAR MEDICINE PET SKULL BASE TO THIGH TECHNIQUE: 7.1 mCi F-18 FDG was injected intravenously. Full-ring PET imaging was performed from the skull base to thigh after the radiotracer. CT data was obtained and used for attenuation correction and anatomic localization. Fasting blood glucose: 145 mg/dl COMPARISON:  CTA chest dated 03/22/2019 FINDINGS: Mediastinal blood pool activity: SUV max 2.7 Liver activity: SUV max NA NECK: No hypermetabolic cervical lymphadenopathy. Incidental CT findings: none CHEST: 2.5 cm central left upper lobe/perihilar nodule (series 8/image 33), previously 2.1 cm, max SUV 7.5. This is suspicious for primary bronchogenic neoplasm. 12 mm nodular opacity in the posterior right upper lobe (series 8/image 20), previously 15 mm, max SUV 1.4. 7 mm subpleural nodule in the posterior right lower lobe (series 8/image 29) and additional 10 mm bilobed subpleural nodule in the posterior right lower lobe (series 8/image 39), previously 8 and 10 mm respectively, max SUV 2.7. 7 mm posterior left lower lobe  nodule (series 8/image 47), previously 12 mm. Additional scattered small bilateral pulmonary nodules. These findings are indeterminate but favor multifocal infection. Lingular atelectasis, mildly progressive. Mild patchy opacity in the medial left upper lobe (series 8/image 29), unchanged. No pleural effusion or pneumothorax. No hypermetabolic thoracic lymphadenopathy. Incidental CT findings: Atherosclerotic calcifications of the aortic arch. Mild coronary atherosclerosis of the LAD. ABDOMEN/PELVIS: No hypermetabolic abdominopelvic lymphadenopathy. No abnormal hypermetabolism in the liver, spleen, pancreas, or adrenal glands. Incidental CT findings: Status post cholecystectomy. Atherosclerotic calcifications the abdominal aorta and branch vessels. SKELETON: No focal hypermetabolic activity to suggest skeletal metastasis. Incidental CT findings: Degenerative changes of the visualized thoracolumbar spine. IMPRESSION: 2.5 cm central left upper lobe/perihilar nodule, mildly progressive, suspicious for primary bronchogenic neoplasm. Scattered small bilateral pulmonary nodules, most of which are mildly improved, favoring mild multifocal infection. No evidence of metastatic disease in the abdomen/pelvis. Electronically Signed   By: Julian Hy M.D.   On: 04/22/2019 14:46    ASSESSMENT: This is a very pleasant 64 years old white female recently diagnosed with limited stage (T2a, N1, M0) small cell lung cancer presented with right hilar mass in addition to right hilar adenopathy diagnosed in February 2017.   PLAN: I had a lengthy discussion with the patient today about her current disease stage, prognosis and treatment options. I personally and independently reviewed the imaging studies and showed the PET scan images to the patient and her husband. I recommended for the patient to complete the staging work-up by ordering MRI of the brain which is scheduled to be done on May 12, 2019. I discussed with the  patient her treatment options and recommended for her systemic chemotherapy with cisplatin 80 mg/M2 on day 1 and etoposide 100 mg/M2 on days 1, 2 and 3 every 3 weeks. The patient is interested in treatment.  She is expected to start the first cycle of this treatment on May 14, 2019. I discussed with the patient the adverse effect of this treatment including but not limited to alopecia, myelosuppression, nausea and vomiting, peripheral neuropathy, liver or renal dysfunction as well as hearing deficit. The patient will have a chemotherapy education class before the first dose of her treatment. She was seen by Dr. Sondra Come earlier today and expected to have a course of concurrent radiotherapy during her chemotherapy.  She will  also have prophylactic cranial irradiation at the end of her treatment if she has complete response to this treatment. For smoking cessation I strongly encouraged the patient to continue with the current smoking cessation. The patient will come back for follow-up visit in 2 weeks for evaluation and management of any adverse effect of her treatment. I will call her pharmacy with prescription for Compazine 10 mg p.o. every 6 hours as needed for nausea. The patient was advised to call immediately if she has any concerning symptoms in the interval.  The patient voices understanding of current disease status and treatment options and is in agreement with the current care plan.  All questions were answered. The patient knows to call the clinic with any problems, questions or concerns. We can certainly see the patient much sooner if necessary.  Thank you so much for allowing me to participate in the care of Christy Abbott. I will continue to follow up the patient with you and assist in her care.  The total time spent in the appointment was 80 minutes.  Disclaimer: This note was dictated with voice recognition software. Similar sounding words can inadvertently be transcribed and may  not be corrected upon review.   Eilleen Kempf May 08, 2019, 1:51 PM

## 2019-05-08 NOTE — Progress Notes (Signed)
Pt has been discussed at [facility] and has follow up with radiation oncology.

## 2019-05-08 NOTE — Progress Notes (Signed)
Oncology Nurse Navigator Documentation  Oncology Nurse Navigator Flowsheets 05/08/2019  Abnormal Finding Date -  Confirmed Diagnosis Date -  Diagnosis Status Confirmed Diagnosis Complete  Planned Course of Treatment Chemo/Radiation Concurrent  Phase of Treatment Chemo/Radiation Concurrent  Navigator Follow Up Reason: Appointment Review  Navigation Complete Date: 05/12/2019  Navigator Location [facility]  Referral Date to RadOnc/MedOnc -  Navigator Encounter Type Clinic/MDC/I spoke with patient and her husband today in clinic.  Christy Abbott treatment plan is concurrent chemo rad to start next week.  I help to explain plan of care and provided information.  I updated CSW on patients plan of care to offer her support if needed.  I did not see any barriers at this time except for education.    Telephone -  [facility] Date 05/08/2019  Multidisiplinary Clinic Type Thoracic  Patient Visit Type MedOnc  Treatment Phase Pre-Tx/Tx Discussion  Barriers/Navigation Needs Education  Education Newly Diagnosed Cancer Education;Pain/ Symptom Management;Understanding Cancer/ Treatment Options;Other  Interventions Education;Psycho-Social Support  Acuity Level 2-Minimal Needs (1-2 Barriers Identified)  Referrals Social Work  Coordination of Care -  Education Method Verbal;Written  Time Spent with Patient 30

## 2019-05-08 NOTE — Progress Notes (Signed)
The proposed treatment discussed in cancer conference 2/25/21is for discussion purpose only and is not a binding recommendation.  The patient was not physically examined nor present for their treatment options.  Therefore, final treatment plans cannot be decided.  

## 2019-05-09 ENCOUNTER — Encounter (HOSPITAL_COMMUNITY): Payer: Self-pay

## 2019-05-09 ENCOUNTER — Other Ambulatory Visit: Payer: Self-pay

## 2019-05-09 ENCOUNTER — Ambulatory Visit (HOSPITAL_COMMUNITY)
Admission: RE | Admit: 2019-05-09 | Discharge: 2019-05-09 | Disposition: A | Discharge status: Home / Self Care | Payer: 59 | Source: Ambulatory Visit | Attending: Hematology | Admitting: Hematology

## 2019-05-09 DIAGNOSIS — Z510 Encounter for antineoplastic radiation therapy: Secondary | ICD-10-CM | POA: Diagnosis not present

## 2019-05-09 DIAGNOSIS — Z833 Family history of diabetes mellitus: Secondary | ICD-10-CM | POA: Diagnosis not present

## 2019-05-09 DIAGNOSIS — I4892 Unspecified atrial flutter: Secondary | ICD-10-CM | POA: Diagnosis not present

## 2019-05-09 DIAGNOSIS — E119 Type 2 diabetes mellitus without complications: Secondary | ICD-10-CM | POA: Insufficient documentation

## 2019-05-09 DIAGNOSIS — I10 Essential (primary) hypertension: Secondary | ICD-10-CM | POA: Diagnosis not present

## 2019-05-09 DIAGNOSIS — Z888 Allergy status to other drugs, medicaments and biological substances status: Secondary | ICD-10-CM | POA: Insufficient documentation

## 2019-05-09 DIAGNOSIS — Z8249 Family history of ischemic heart disease and other diseases of the circulatory system: Secondary | ICD-10-CM | POA: Insufficient documentation

## 2019-05-09 DIAGNOSIS — Z79899 Other long term (current) drug therapy: Secondary | ICD-10-CM | POA: Diagnosis not present

## 2019-05-09 DIAGNOSIS — Z85020 Personal history of malignant carcinoid tumor of stomach: Secondary | ICD-10-CM | POA: Insufficient documentation

## 2019-05-09 DIAGNOSIS — E0590 Thyrotoxicosis, unspecified without thyrotoxic crisis or storm: Secondary | ICD-10-CM | POA: Insufficient documentation

## 2019-05-09 DIAGNOSIS — Z794 Long term (current) use of insulin: Secondary | ICD-10-CM | POA: Diagnosis not present

## 2019-05-09 DIAGNOSIS — Z87891 Personal history of nicotine dependence: Secondary | ICD-10-CM | POA: Insufficient documentation

## 2019-05-09 DIAGNOSIS — M1990 Unspecified osteoarthritis, unspecified site: Secondary | ICD-10-CM | POA: Insufficient documentation

## 2019-05-09 DIAGNOSIS — C3490 Malignant neoplasm of unspecified part of unspecified bronchus or lung: Secondary | ICD-10-CM | POA: Insufficient documentation

## 2019-05-09 DIAGNOSIS — Z807 Family history of other malignant neoplasms of lymphoid, hematopoietic and related tissues: Secondary | ICD-10-CM | POA: Insufficient documentation

## 2019-05-09 HISTORY — PX: IR IMAGING GUIDED PORT INSERTION: IMG5740

## 2019-05-09 LAB — GLUCOSE, CAPILLARY: Glucose-Capillary: 172 mg/dL — ABNORMAL HIGH (ref 70–99)

## 2019-05-09 IMAGING — XA IR IMAGING GUIDED PORT INSERTION
2 series · 2 of 2 positions shown · non-contrast
Comparison: none

INDICATION: 63-year-old female with small cell lung cancer in need of durable
venous access for chemotherapy.

[Series 1: (id) · 1 of 1 slices shown]
[im 1/1]
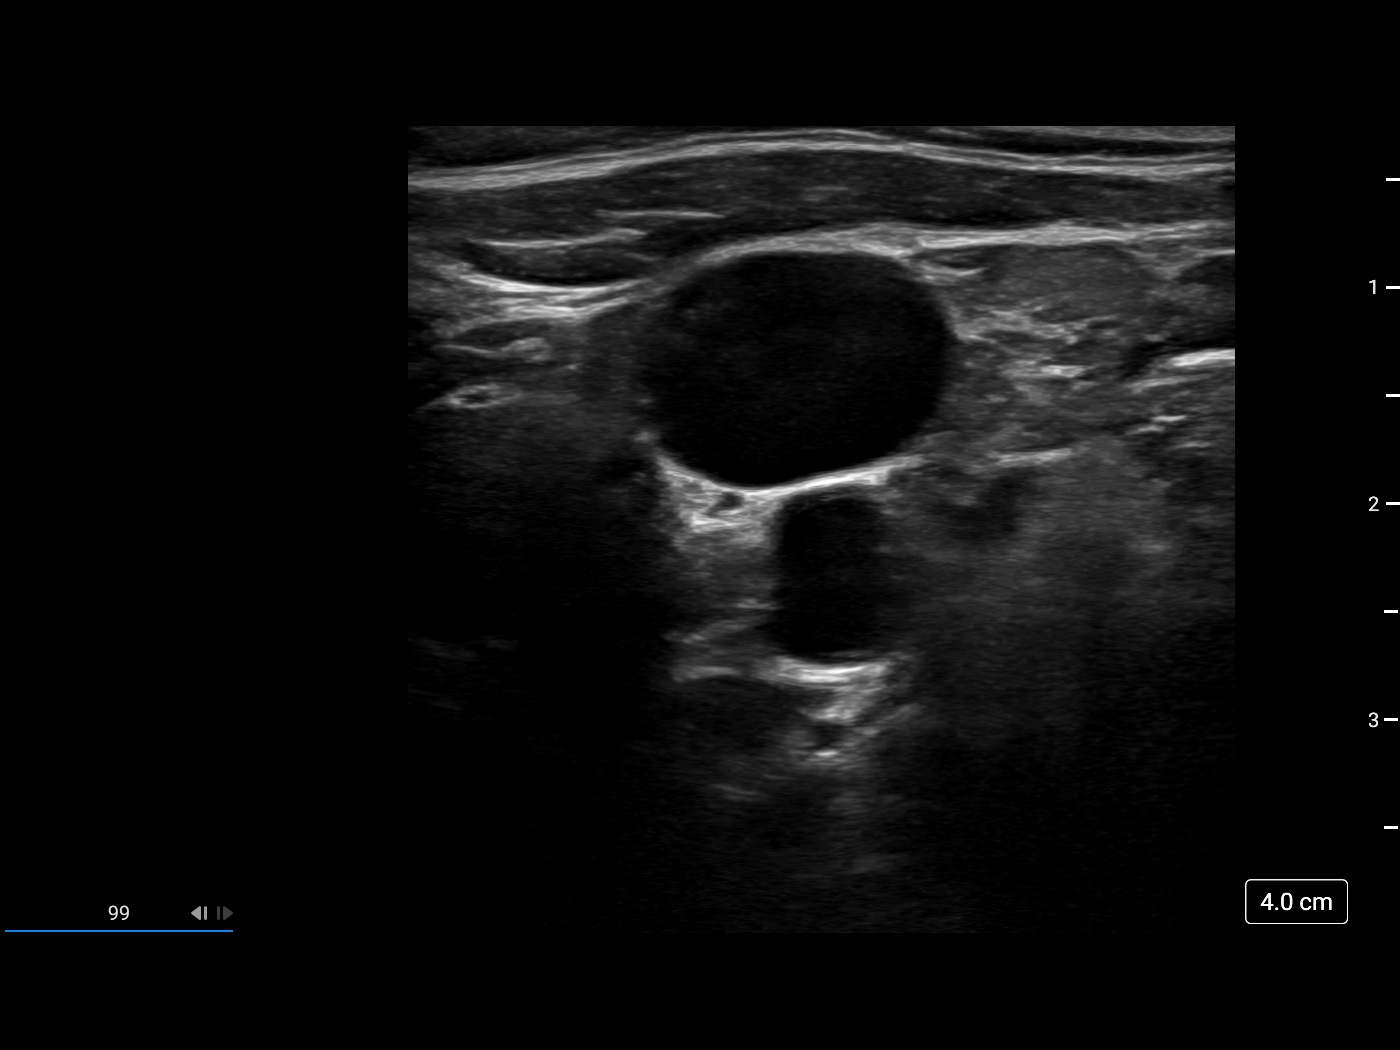

[Series 300: ir imaging guided port insertion · 1 of 1 slices shown]
[im 1/1]
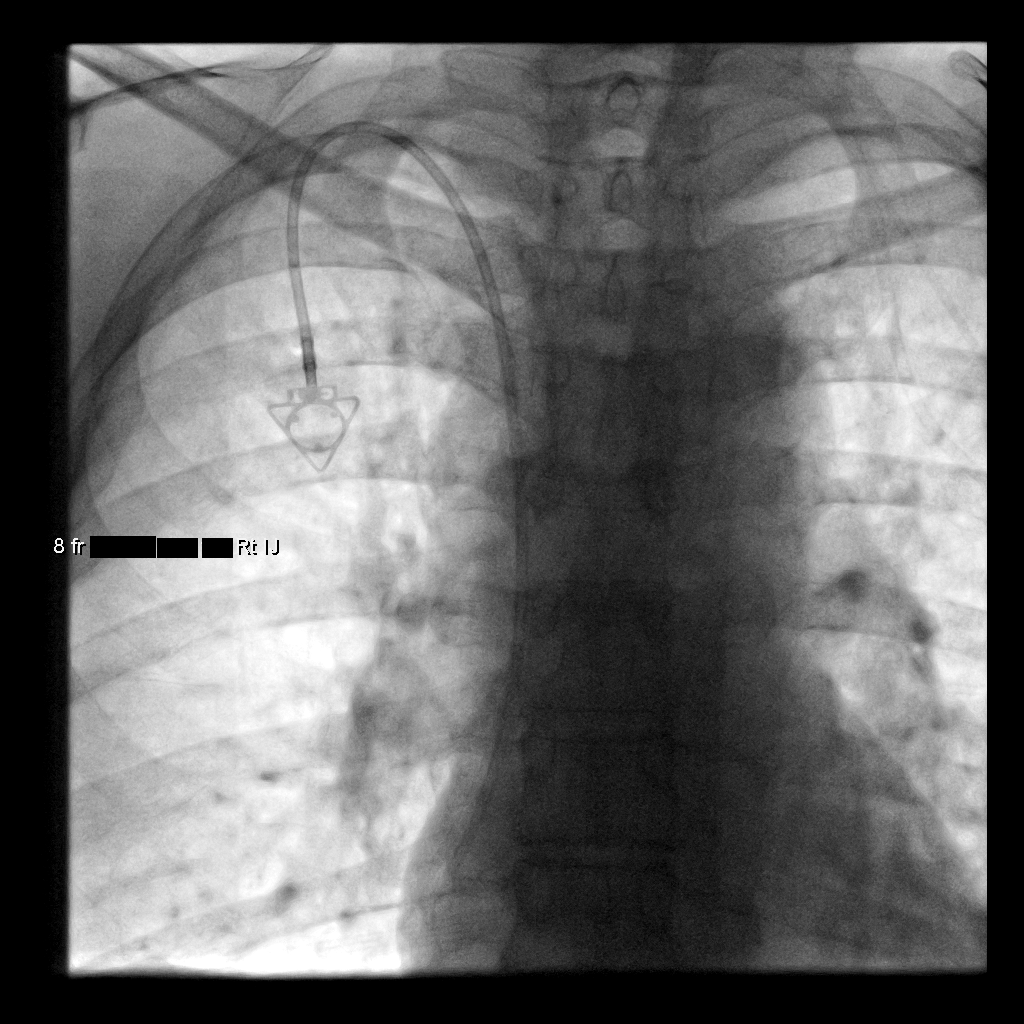

[2 of 2 positions shown; findings below may reference images not displayed]

EXAM:
IMPLANTED PORT A CATH PLACEMENT WITH ULTRASOUND AND FLUOROSCOPIC
GUIDANCE

MEDICATIONS:
2 g Ancef; The antibiotic was administered within an appropriate
time interval prior to skin puncture.

ANESTHESIA/SEDATION:
Versed 2 mg IV; Fentanyl 100 mcg IV;

Moderate Sedation Time:  24 minutes

The patient was continuously monitored during the procedure by the
interventional radiology nurse under my direct supervision.

FLUOROSCOPY TIME:  0 minutes, 36 seconds (7 mGy)

COMPLICATIONS:
None immediate.

PROCEDURE:
The right neck and chest was prepped with chlorhexidine, and draped
in the usual sterile fashion using maximum barrier technique (cap
and mask, sterile gown, sterile gloves, large sterile sheet, hand
hygiene and cutaneous antiseptic). Local anesthesia was attained by
infiltration with 1% lidocaine with epinephrine.

Ultrasound demonstrated patency of the right internal jugular vein,
and this was documented with an image. Under real-time ultrasound
guidance, this vein was accessed with a 21 gauge micropuncture
needle and image documentation was performed. A small dermatotomy
was made at the access site with an 11 scalpel. A 0.018" wire was
advanced into the SVC and the access needle exchanged for a 4F
micropuncture vascular sheath. The 0.018" wire was then removed and
a 0.035" wire advanced into the IVC.



The venous access site was then serially dilated and a peel away
vascular sheath placed over the wire. The wire was removed and the
port catheter advanced into position under fluoroscopic guidance.
The catheter tip is positioned in the superior cavoatrial junction.
This was documented with a spot image. The portacatheter was then
tested and found to flush and aspirate well. The port was flushed
with saline followed by 100 units/mL heparinized saline.

The pocket was then closed in two layers using first subdermal
inverted interrupted absorbable sutures followed by a running
subcuticular suture. The epidermis was then sealed with Dermabond.
The dermatotomy at the venous access site was also closed with
Dermabond.
IMPRESSION: Successful placement of a right IJ approach Power Port with
ultrasound and fluoroscopic guidance. The catheter is ready for use.

## 2019-05-09 MED ORDER — SODIUM CHLORIDE 0.9 % IV SOLN: INTRAVENOUS | Status: DC

## 2019-05-09 MED ORDER — CEFAZOLIN SODIUM-DEXTROSE 2-4 GM/100ML-% IV SOLN: 2.0000 g | Freq: Once | INTRAVENOUS | Status: AC

## 2019-05-09 MED ORDER — MIDAZOLAM HCL 2 MG/2ML IJ SOLN
INTRAMUSCULAR | Status: AC | PRN
  Administered 2019-05-09 (×2): 1 mg via INTRAVENOUS

## 2019-05-09 MED ORDER — FENTANYL CITRATE (PF) 100 MCG/2ML IJ SOLN
INTRAMUSCULAR | Status: AC | PRN
  Administered 2019-05-09 (×2): 50 ug via INTRAVENOUS

## 2019-05-09 MED ORDER — CEFAZOLIN SODIUM-DEXTROSE 2-4 GM/100ML-% IV SOLN
INTRAVENOUS | Status: AC
  Administered 2019-05-09: 15:00:00 2 g via INTRAVENOUS
  Filled 2019-05-09: qty 100

## 2019-05-09 MED ORDER — MIDAZOLAM HCL 2 MG/2ML IJ SOLN
INTRAMUSCULAR | Status: AC
  Filled 2019-05-09: qty 2

## 2019-05-09 MED ORDER — LIDOCAINE HCL (PF) 1 % IJ SOLN
INTRAMUSCULAR | Status: AC | PRN
  Administered 2019-05-09 (×2): 10 mL via INTRADERMAL

## 2019-05-09 MED ORDER — LIDOCAINE-EPINEPHRINE 1 %-1:100000 IJ SOLN
INTRAMUSCULAR | Status: AC
  Filled 2019-05-09: qty 1

## 2019-05-09 MED ORDER — HEPARIN SOD (PORK) LOCK FLUSH 100 UNIT/ML IV SOLN
INTRAVENOUS | Status: AC | PRN
  Administered 2019-05-09: 500 [IU] via INTRAVENOUS

## 2019-05-09 MED ORDER — FENTANYL CITRATE (PF) 100 MCG/2ML IJ SOLN
INTRAMUSCULAR | Status: AC
  Filled 2019-05-09: qty 2

## 2019-05-09 MED ORDER — HEPARIN SOD (PORK) LOCK FLUSH 100 UNIT/ML IV SOLN
INTRAVENOUS | Status: AC
  Filled 2019-05-09: qty 5

## 2019-05-09 NOTE — Procedures (Signed)
Interventional Radiology Procedure Note  Procedure: Placement of a right IJ approach single lumen PowerPort.  Tip is positioned at the superior cavoatrial junction and catheter is ready for immediate use.  Complications: No immediate Recommendations:  - Ok to shower tomorrow - Do not submerge for 7 days - Routine line care   Signed,  Nicholas Ossa K. Onesimo Lingard, MD   

## 2019-05-09 NOTE — Discharge Instructions (Addendum)
Implanted [facility] Guide An implanted port is a device that is placed under the skin. It is usually placed in the chest. The device can be used to give IV medicine, to take blood, or for dialysis. You may have an implanted port if:  You need IV medicine that would be irritating to the small veins in your hands or arms.  You need IV medicines, such as antibiotics, for a long period of time.  You need IV nutrition for a long period of time.  You need dialysis. Having a port means that your health care provider will not need to use the veins in your arms for these procedures. You may have fewer limitations when using a port than you would if you used other types of long-term IVs, and you will likely be able to return to normal activities after your incision heals. An implanted port has two main parts:  Reservoir. The reservoir is the part where a needle is inserted to give medicines or draw blood. The reservoir is round. After it is placed, it appears as a small, raised area under your skin.  Catheter. The catheter is a thin, flexible tube that connects the reservoir to a vein. Medicine that is inserted into the reservoir goes into the catheter and then into the vein. How is my port accessed? To access your port:  A numbing cream may be placed on the skin over the port site.  Your health care provider will put on a mask and sterile gloves.  The skin over your port will be cleaned carefully with a germ-killing soap and allowed to dry.  Your health care provider will gently pinch the port and insert a needle into it.  Your health care provider will check for a blood return to make sure the port is in the vein and is not clogged.  If your port needs to remain accessed to get medicine continuously (constant infusion), your health care provider will place a clear bandage (dressing) over the needle site. The dressing and needle will need to be changed every week, or as told by your health care  provider. What is flushing? Flushing helps keep the port from getting clogged. Follow instructions from your health care provider about how and when to flush the port. Ports are usually flushed with saline solution or a medicine called heparin. The need for flushing will depend on how the port is used:  If the port is only used from time to time to give medicines or draw blood, the port may need to be flushed: ? Before and after medicines have been given. ? Before and after blood has been drawn. ? As part of routine maintenance. Flushing may be recommended every 4-6 weeks.  If a constant infusion is running, the port may not need to be flushed.  Throw away any syringes in a disposal container that is meant for sharp items (sharps container). You can buy a sharps container from a pharmacy, or you can make one by using an empty hard plastic bottle with a cover. How long will my port stay implanted? The port can stay in for as long as your health care provider thinks it is needed. When it is time for the port to come out, a surgery will be done to remove it. The surgery will be similar to the procedure that was done to put the port in. Follow these instructions at home:   Flush your port as told by your health care provider.  If you need an infusion over several days, follow instructions from your health care provider about how to take care of your port site. Make sure you: ? Wash your hands with soap and water before you change your dressing. If soap and water are not available, use alcohol-based hand sanitizer. ? Change your dressing as told by your health care provider. ? Place any used dressings or infusion bags into a plastic bag. Throw that bag in the trash. ? Keep the dressing that covers the needle clean and dry. Do not get it wet. ? Do not use scissors or sharp objects near the tube. ? Keep the tube clamped, unless it is being used.  Check your port site every day for signs of  infection. Check for: ? Redness, swelling, or pain. ? Fluid or blood. ? Pus or a bad smell.  Protect the skin around the port site. ? Avoid wearing bra straps that rub or irritate the site. ? Protect the skin around your port from seat belts. Place a soft pad over your chest if needed.  Bathe or shower as told by your health care provider. The site may get wet as long as you are not actively receiving an infusion.  Return to your normal activities as told by your health care provider. Ask your health care provider what activities are safe for you.  Carry a medical alert card or wear a medical alert bracelet at all times. This will let health care providers know that you have an implanted port in case of an emergency. Get help right away if:  You have redness, swelling, or pain at the port site.  You have fluid or blood coming from your port site.  You have pus or a bad smell coming from the port site.  You have a fever. Summary  Implanted ports are usually placed in the chest for long-term IV access.  Follow instructions from your health care provider about flushing the port and changing bandages (dressings).  Take care of the area around your port by avoiding clothing that puts pressure on the area, and by watching for signs of infection.  Protect the skin around your port from seat belts. Place a soft pad over your chest if needed.  Get help right away if you have a fever or you have redness, swelling, pain, drainage, or a bad smell at the port site. This information is not intended to replace advice given to you by your health care provider. Make sure you discuss any questions you have with your health care provider. Document Revised: 06/21/2018 Document Reviewed: 04/01/2016 Elsevier Patient Education  [place]. Moderate Conscious Sedation, Adult, Care After These instructions provide you with information about caring for yourself after your procedure. Your health  care provider may also give you more specific instructions. Your treatment has been planned according to current medical practices, but problems sometimes occur. Call your health care provider if you have any problems or questions after your procedure. What can I expect after the procedure? After your procedure, it is common:  To feel sleepy for several hours.  To feel clumsy and have poor balance for several hours.  To have poor judgment for several hours.  To vomit if you eat too soon. Follow these instructions at home: For at least 24 hours after the procedure:   Do not: ? Participate in activities where you could fall or become injured. ? Drive. ? Use heavy machinery. ? Drink alcohol. ? Take sleeping  pills or medicines that cause drowsiness. ? Make important decisions or sign legal documents. ? Take care of children on your own.  Rest. Eating and drinking  Follow the diet recommended by your health care provider.  If you vomit: ? Drink water, juice, or soup when you can drink without vomiting. ? Make sure you have little or no nausea before eating solid foods. General instructions  Have a responsible adult stay with you until you are awake and alert.  Take over-the-counter and prescription medicines only as told by your health care provider.  If you smoke, do not smoke without supervision.  Keep all follow-up visits as told by your health care provider. This is important. Contact a health care provider if:  You keep feeling nauseous or you keep vomiting.  You feel light-headed.  You develop a rash.  You have a fever. Get help right away if:  You have trouble breathing. This information is not intended to replace advice given to you by your health care provider. Make sure you discuss any questions you have with your health care provider. Document Revised: 02/09/2017 Document Reviewed: 06/19/2015 Elsevier Patient Education  2020 [employer].

## 2019-05-09 NOTE — H&P (Signed)
Chief Complaint: Patient was seen in consultation today for port placement at the request of [place]  Referring Physician(s): [place]  Supervising Physician: Jacqulynn Cadet  Patient Status: [facility] - Out-pt  History of Present Illness: Christy Abbott is a 64 y.o. female with left sided lung cancer. She is to start chemotherapy next week and is referred for port placement. PMHx, meds, labs, imaging, allergies reviewed. Feels well, no recent fevers, chills, illness. Has been NPO today as directed.    Past Medical History:  Diagnosis Date  . Arthritis   . Atrial flutter [facility])    s/p CTI by Dr Lovena Le 02/2013  . Chest pain   . Diabetes mellitus without complication ([place])   . Dysrhythmia    hx of Aflutter  . 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 Hemochromatosis associated with compound heterozygous mutation in HFE gene ([place]) 12/10/2017  . Hypertension   . Hyperthyroidism 10/31/2014    Past Surgical History:  Procedure Laterality Date  . ABLATION  03-04-2013   CTI by Dr Lovena Le for atrial flutter  . ATRIAL FLUTTER ABLATION N/A 03/04/2013   Procedure: ATRIAL FLUTTER ABLATION;  Surgeon: Evans Lance, MD;  Location: [facility] CATH LAB;  Service: Cardiovascular;  Laterality: N/A;  . CHOLECYSTECTOMY  1982  . COLONOSCOPY W/ POLYPECTOMY     x 2  . ESOPHAGOGASTRODUODENOSCOPY N/A 05/20/2014   Procedure: ESOPHAGOGASTRODUODENOSCOPY (EGD);  Surgeon: Teena Irani, MD;  Location: [facility] END[facility];  Service: Endoscopy;  Laterality: N/A;  . TONSILLECTOMY  1972 ?  Marland Kitchen VIDEO BRONCHOSCOPY WITH ENDOBRONCHIAL ULTRASOUND Left 04/30/2019   Procedure: VIDEO BRONCHOSCOPY WITH ENDOBRONCHIAL ULTRASOUND;  Surgeon: Garner Nash, DO;  Location: [facility];  Service: Pulmonary;  Laterality: Left;    Allergies: Jardiance [empagliflozin] and Trazodone and nefazodone  Medications: Prior to Admission medications   Medication Sig Start Date End Date Taking? Authorizing Provider  acetaminophen  (TYLENOL) 500 MG tablet Take 1,000 mg by mouth every 6 (six) hours as needed for moderate pain or headache.   Yes [provider]  Bioflavonoid Products (ESTER C PO) Take 1 tablet by mouth daily.   Yes [provider]  Calcium Carb-Cholecalciferol (CALTRATE 600+D3 PO) Take 1 tablet by mouth daily.   Yes [provider]  carvedilol (COREG) 12.5 MG tablet Take 1 tablet (12.5 mg total) by mouth in the morning and at bedtime. 05/05/19  Yes Jettie Booze, MD  diphenhydramine-acetaminophen (TYLENOL PM) 25-500 MG TABS tablet Take 2 tablets by mouth at bedtime.   Yes [provider]  flecainide (TAMBOCOR) 50 MG tablet TAKE 1 TABLET BY MOUTH TWICE DAILY, MAY TAKE AN ADDITIONAL 1 TABLET AS NEEDED. Please keep upcoming appt in January. Thank you Patient taking differently: Take 50 mg by mouth See admin instructions. Take 50 mg by mouth twice daily, may take a third 50 mg dose as needed for palpitations 02/18/19  Yes Jettie Booze, MD  glimepiride (AMARYL) 4 MG tablet Take 1 tablet (4 mg total) by mouth daily with breakfast. 05/05/19  Yes [name], NP  insulin glargine (LANTUS) 100 unit/mL SOPN Inject 0.1 mLs (10 Units total) into the skin daily. Patient taking differently: Inject 10 Units into the skin at bedtime.  04/01/19  Yes [name], NP  lisinopril-hydrochlorothiazide (PRINZIDE,ZESTORETIC) 20-25 MG tablet Take 1 tablet by mouth daily. 05/28/18  Yes [name], NP  metFORMIN (GLUCOPHAGE) 500 MG tablet Take 2 tablets (1,000 mg total) by mouth 2 (two) times daily. 07/26/18  Yes Inda Castle,  Melissa, NP  methimazole (TAPAZOLE) 10 MG tablet TAKE 1 & 1/2 (ONE & ONE-HALF) TABLETS BY MOUTH ONCE DAILY Patient taking differently: Take 15 mg by mouth daily.  04/10/19  Yes [name], NP  Omega-3 Fatty Acids (FISH OIL) 1200 MG CAPS Take 1,200 mg by mouth daily.    Yes [provider]  potassium chloride SA (KLOR-CON) 20 MEQ  tablet TAKE 1  BY MOUTH ONCE DAILY Patient taking differently: Take 20 mEq by mouth daily.  03/05/19  Yes [name], NP  simvastatin (ZOCOR) 10 MG tablet TAKE 1 TABLET BY MOUTH AT BEDTIME Patient taking differently: Take 10 mg by mouth at bedtime.  03/05/19  Yes [name], NP  albuterol (VENTOLIN HFA) 108 (90 Base) MCG/ACT inhaler Inhale 2 puffs into the lungs every 6 (six) hours as needed for wheezing or shortness of breath. Patient not taking: Reported on 04/25/2019 03/28/19   Aline August, MD  furosemide (LASIX) 20 MG tablet Take 1 tablet (20 mg total) by mouth daily as needed. Patient not taking: Reported on 05/08/2019 04/08/19   Imogene Burn, PA-C  guaiFENesin-dextromethorphan [facility] DM) 100-10 MG/5ML syrup Take 5 mLs by mouth every 4 (four) hours as needed for cough. Patient not taking: Reported on 05/02/2019 03/28/19   Aline August, MD  Insulin Pen Needle (B-D UF III MINI PEN NEEDLES) 31G X 5 MM MISC Use with insulin pen 04/01/19   [name], NP  Lancets 30G MISC Check sugar twice daily. 06/09/18   [name], NP  Tetrahydrozoline HCl (VISINE OP) Place 1 drop into both eyes daily as needed (irritation).    [provider]     Family History  Problem Relation Age of Onset  . Atrial fibrillation Mother   . Arrhythmia Mother   . Diabetes Mother   . Hodgkin's lymphoma Father     Social History   Socioeconomic History  . Marital status: Married    Spouse name: Not on file  . Number of children: Not on file  . Years of education: Not on file  . Highest education level: Not on file  Occupational History  . Not on file  Tobacco Use  . Smoking status: Former Smoker    Packs/day: 1.00    Types: Cigarettes    Quit date: 03/21/2019    Years since quitting: 0.1  . Smokeless tobacco: Never Used  . Tobacco comment: started age 21 , quit for a year many times  Substance and Sexual Activity  . Alcohol use: No    Alcohol/week: 0.0  standard drinks  . Drug use: No  . Sexual activity: Not on file  Other Topics Concern  . Not on file  Social History Narrative   Married   Clinical cytogeneticist- full time   Daughter- 2 grandchildren, live in [place]   Enjoys playing on computer.     Social Determinants of Health   Financial Resource Strain:   . Difficulty of Paying Living Expenses: Not on file  Food Insecurity:   . Worried About Charity fundraiser in the Last Year: Not on file  . Ran Out of Food in the Last Year: Not on file  Transportation Needs:   . Lack of Transportation (Medical): Not on file  . Lack of Transportation (Non-Medical): Not on file  Physical Activity:   . Days of Exercise per Week: Not on file  . Minutes of Exercise per Session: Not on file  Stress:   . Feeling of Stress : Not on  file  Social Connections:   . Frequency of Communication with Friends and Family: Not on file  . Frequency of Social Gatherings with Friends and Family: Not on file  . Attends Religious Services: Not on file  . Active Member of Clubs or Organizations: Not on file  . Attends Archivist Meetings: Not on file  . Marital Status: Not on file     Review of Systems: A 12 point ROS discussed and pertinent positives are indicated in the HPI above.  All other systems are negative.  Review of Systems  Vital Signs: T: 98.7, RR: 19, HR: 89, BP: 153/83  Physical Exam Constitutional:      Appearance: Normal appearance.  HENT:     Mouth/Throat:     Mouth: Mucous membranes are moist.     Pharynx: Oropharynx is clear.  Cardiovascular:     Rate and Rhythm: Normal rate and regular rhythm.     Heart sounds: Normal heart sounds.  Pulmonary:     Effort: Pulmonary effort is normal. No respiratory distress.     Breath sounds: Normal breath sounds.  Neurological:     General: No focal deficit present.     Mental Status: She is alert and oriented to person, place, and time.  Psychiatric:        Mood and Affect: Mood  normal.        Thought Content: Thought content normal.        Judgment: Judgment normal.     Imaging: DG Chest 2 View  Result Date: 04/09/2019 CLINICAL DATA:  Follow-up pneumonia EXAM: CHEST - 2 VIEW COMPARISON:  03/27/2019, 03/25/2019, CT 03/21/2018 FINDINGS: Clearing of previously noted bilateral pleural effusions and basilar airspace disease. Faint focus of slightly nodular opacity in the right upper lobe. Other lung nodules by CT are not well seen radiographically. Normal heart size. No pneumothorax. IMPRESSION: 1. Interval resolution of small pleural effusions and basilar airspace disease. 2. Residual focus of nodularity and mild airspace disease in the right upper lobe, likely corresponding to the CT demonstrated possible pulmonary nodule for which CT follow-up was previously recommended, refer to 05/02/2019 CT chest report. Electronically Signed   By: Donavan Foil M.D.   On: 04/09/2019 15:53   NM PET Image Initial (PI) Skull Base To Thigh  Result Date: 04/22/2019 CLINICAL DATA:  Initial treatment strategy for multiple pulmonary nodules. EXAM: NUCLEAR MEDICINE PET SKULL BASE TO THIGH TECHNIQUE: 7.1 mCi F-18 FDG was injected intravenously. Full-ring PET imaging was performed from the skull base to thigh after the radiotracer. CT data was obtained and used for attenuation correction and anatomic localization. Fasting blood glucose: 145 mg/dl COMPARISON:  CTA chest dated 03/22/2019 FINDINGS: Mediastinal blood pool activity: SUV max 2.7 Liver activity: SUV max NA NECK: No hypermetabolic cervical lymphadenopathy. Incidental CT findings: none CHEST: 2.5 cm central left upper lobe/perihilar nodule (series 8/image 33), previously 2.1 cm, max SUV 7.5. This is suspicious for primary bronchogenic neoplasm. 12 mm nodular opacity in the posterior right upper lobe (series 8/image 20), previously 15 mm, max SUV 1.4. 7 mm subpleural nodule in the posterior right lower lobe (series 8/image 29) and additional 10  mm bilobed subpleural nodule in the posterior right lower lobe (series 8/image 39), previously 8 and 10 mm respectively, max SUV 2.7. 7 mm posterior left lower lobe nodule (series 8/image 47), previously 12 mm. Additional scattered small bilateral pulmonary nodules. These findings are indeterminate but favor multifocal infection. Lingular atelectasis, mildly progressive. Mild patchy opacity in  the medial left upper lobe (series 8/image 29), unchanged. No pleural effusion or pneumothorax. No hypermetabolic thoracic lymphadenopathy. Incidental CT findings: Atherosclerotic calcifications of the aortic arch. Mild coronary atherosclerosis of the LAD. ABDOMEN/PELVIS: No hypermetabolic abdominopelvic lymphadenopathy. No abnormal hypermetabolism in the liver, spleen, pancreas, or adrenal glands. Incidental CT findings: Status post cholecystectomy. Atherosclerotic calcifications the abdominal aorta and branch vessels. SKELETON: No focal hypermetabolic activity to suggest skeletal metastasis. Incidental CT findings: Degenerative changes of the visualized thoracolumbar spine. IMPRESSION: 2.5 cm central left upper lobe/perihilar nodule, mildly progressive, suspicious for primary bronchogenic neoplasm. Scattered small bilateral pulmonary nodules, most of which are mildly improved, favoring mild multifocal infection. No evidence of metastatic disease in the abdomen/pelvis. Electronically Signed   By: Julian Hy M.D.   On: 04/22/2019 14:46    Labs:  CBC: Recent Labs    04/01/19 1518 04/30/19 0712 05/02/19 1210 05/08/19 1318  WBC 13.0* 10.1 10.7* 8.1  HGB 15.7* 13.8 14.3 13.6  HCT 46.6* 41.8 43.2 40.8  PLT 461.0* 250 243 243    COAGS: Recent Labs    03/22/19 1008 04/30/19 0712  INR 1.0 0.9  APTT 25 27    BMP: Recent Labs    03/28/19 0400 03/28/19 0400 04/01/19 1518 04/30/19 0712 05/02/19 1210 05/08/19 1318  NA 139   < > 134* 140 141 143  K 3.7   < > 4.2 3.3* 4.4 4.4  CL 97*   < > 90* 103  101 103  CO2 31   < > 33* 25 32 31  GLUCOSE 102*   < > 320* 114* 173* 272*  BUN 6*   < > 16 12 17 10   CALCIUM 8.5*   < > 10.1 9.1 9.8 9.6  CREATININE 0.54   < > 0.84 0.68 0.70 0.77  GFRNONAA >60  --   --  >60 >60 >60  GFRAA >60  --   --  >60 >60 >60   < > = values in this interval not displayed.    LIVER FUNCTION TESTS: Recent Labs    03/22/19 0003 04/30/19 0712 05/02/19 1210 05/08/19 1318  BILITOT 1.8* 0.8 0.5 0.4  AST 26 19 18  12*  ALT 31 14 16 15   ALKPHOS 185* 87 85 107  PROT 6.5 6.1* 7.3 6.7  ALBUMIN 3.1* 3.3* 4.4 3.4*    TUMOR MARKERS: No results for input(s): AFPTM, CEA, CA199, CHROMGRNA in the last 8760 hours.  Assessment and Plan: Lung cancer For port Risks and benefits of image guided port-a-catheter placement was discussed with the patient including, but not limited to bleeding, infection, pneumothorax, or fibrin sheath development and need for additional procedures.  All of the patient's questions were answered, patient is agreeable to proceed. Consent signed and in chart.    Thank you for this interesting consult.  I greatly enjoyed meeting Christy Abbott and look forward to participating in their care.  A copy of this report was sent to the requesting provider on this date.  Electronically Signed: Ascencion Dike, PA-C 05/09/2019, 1:56 PM   I spent a total of 20 minutes in face to face in clinical consultation, greater than 50% of which was counseling/coordinating care for port placement

## 2019-05-12 ENCOUNTER — Telehealth: Payer: Self-pay | Admitting: Internal Medicine

## 2019-05-12 ENCOUNTER — Ambulatory Visit (HOSPITAL_COMMUNITY)
Admission: RE | Admit: 2019-05-12 | Discharge: 2019-05-12 | Disposition: A | Discharge status: Home / Self Care | Payer: 59 | Source: Ambulatory Visit | Attending: Hematology | Admitting: Hematology

## 2019-05-12 ENCOUNTER — Other Ambulatory Visit: Payer: Self-pay

## 2019-05-12 ENCOUNTER — Telehealth: Payer: Self-pay | Admitting: Medical Oncology

## 2019-05-12 DIAGNOSIS — C3490 Malignant neoplasm of unspecified part of unspecified bronchus or lung: Secondary | ICD-10-CM | POA: Insufficient documentation

## 2019-05-12 IMAGING — MR MR HEAD WO/W CM
13 series · 48 of 48 positions shown · IV contrast (gadavist)
Comparison: None.

CLINICAL DATA: Non-small lung cancer. Staging.

EXAM:
MRI HEAD WITHOUT AND WITH CONTRAST
TECHNIQUE: Multiplanar, multiecho pulse sequences of the brain and surrounding
structures were obtained without and with intravenous contrast.
CONTRAST:  7mL GADAVIST GADOBUTROL 1 MMOL/ML IV SOLN

[Series 5: T1 · sagittal · 5.0mm · 0.75mm/px · 1 of 24 slices shown (1 of 2)]
[im 1/24]
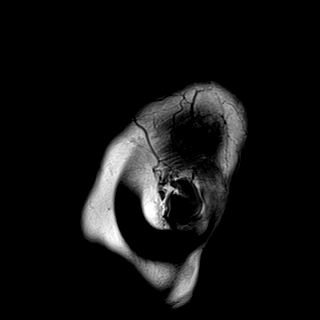

[Series 6: T2 · axial · 5.0mm · 0.62mm/px · z∈[-62,+100]mm · 2 of 26 slices shown]
[im 1/26]
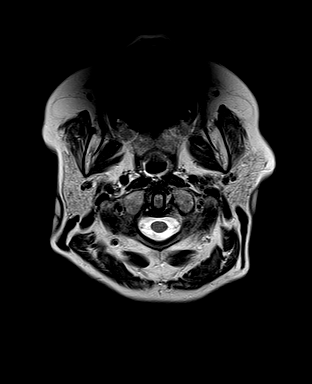
[im 26/26]
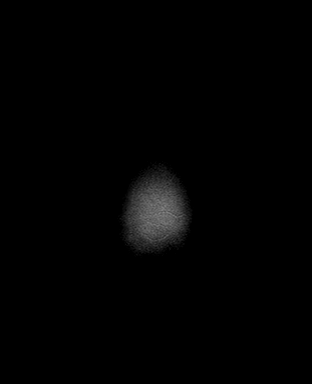

[Series 7: DWI · axial · 3.0mm · 1.36mm/px · z∈[-58,+95]mm · 6 of 102 slices shown (1 of 3)]
[im 1/102]
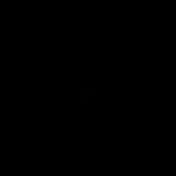
[im 21/102]
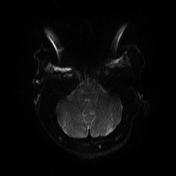
[im 41/102]
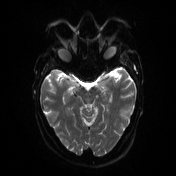
[im 61/102]
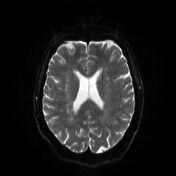
[im 81/102]
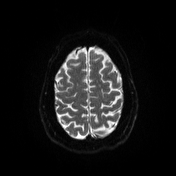
[im 102/102]
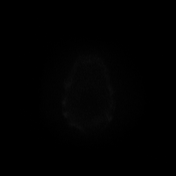

[Series 8: DWI · axial · 3.0mm · 1.36mm/px · z∈[-49,+95]mm · 3 of 49 slices shown (2 of 3)]
[im 1/49]
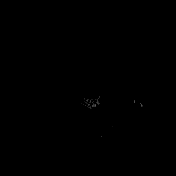
[im 25/49]
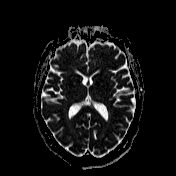
[im 49/49]
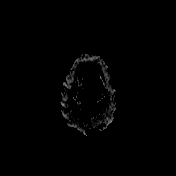

[Series 9: mip_images(sw) · axial · 24.0mm · 0.75mm/px · z∈[-53,+90]mm · 3 of 49 slices shown]
[im 1/49]
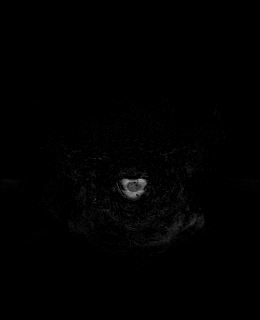
[im 25/49]
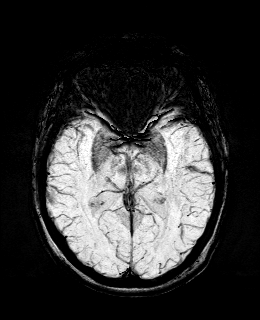
[im 49/49]
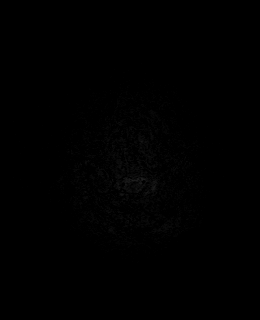

[Series 10: swi_images · axial · 3.0mm · 0.75mm/px · z∈[-64,+101]mm · 3 of 56 slices shown]
[im 1/56]
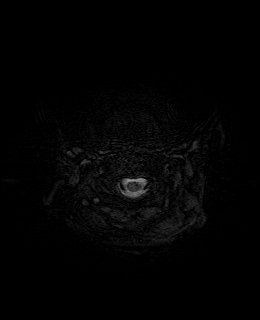
[im 28/56]
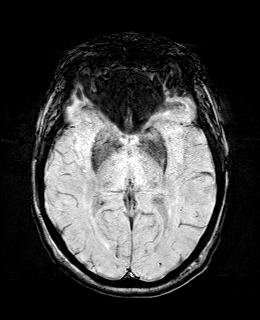
[im 56/56]
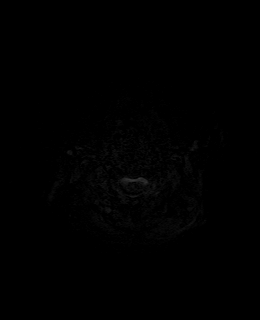

[Series 11: FLAIR · axial · 3.0mm · 0.75mm/px · z∈[-61,+98]mm · 3 of 54 slices shown]
[im 1/54]
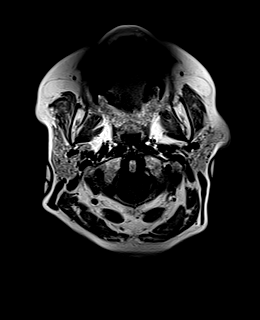
[im 27/54]
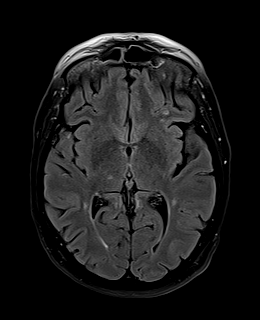
[im 54/54]
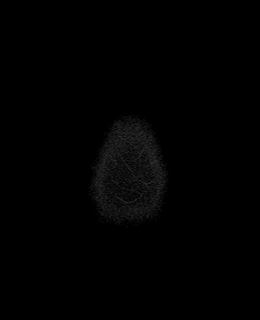

[Series 12: T1 · axial · 1.0mm · 0.94mm/px · z∈[-61,+98]mm · 10 of 160 slices shown (2 of 2)]
[im 1/160]
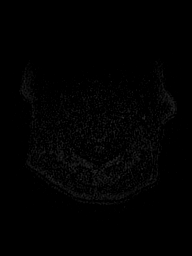
[im 18/160]
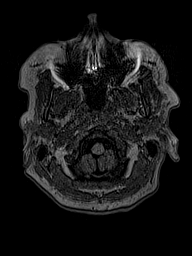
[im 36/160]
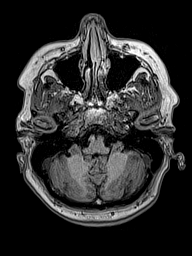
[im 54/160]
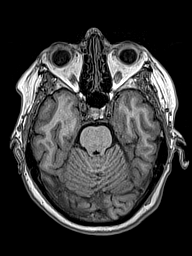
[im 71/160]
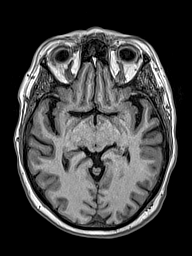
[im 89/160]
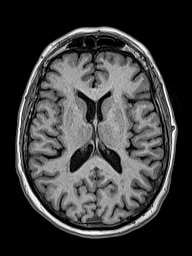
[im 107/160]
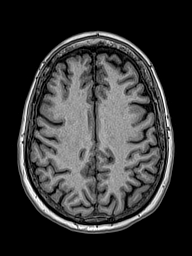
[im 124/160]
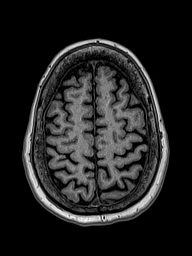
[im 142/160]
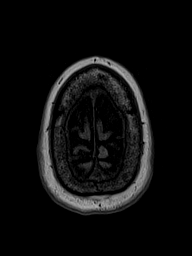
[im 160/160]
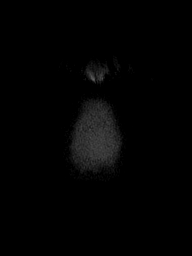

[Series 14: DWI · coronal · 5.0mm · 1.31mm/px · 2 of 34 slices shown (3 of 3)]
[im 1/34]
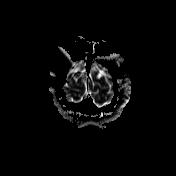
[im 34/34]
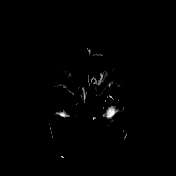

[Series 15: T2 post-contrast · coronal · 5.0mm · 0.57mm/px · 2 of 32 slices shown]
[im 1/32]
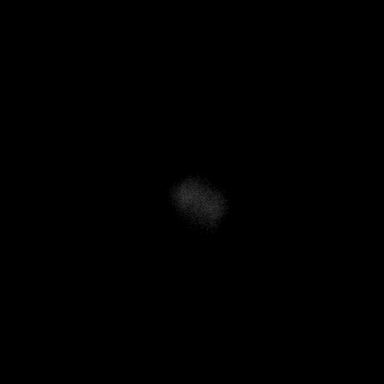
[im 32/32]
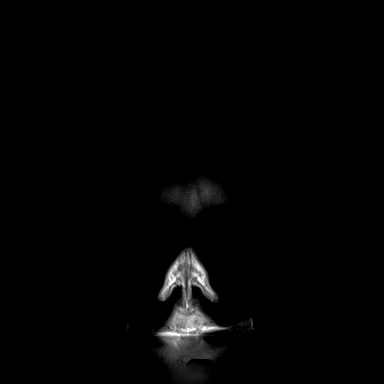

[Series 16: T1 post-contrast · axial · 1.0mm · 0.94mm/px · z∈[-61,+98]mm · 10 of 160 slices shown (1 of 3)]
[im 1/160]
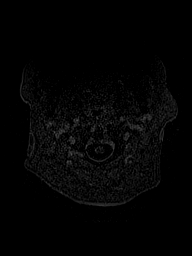
[im 18/160]
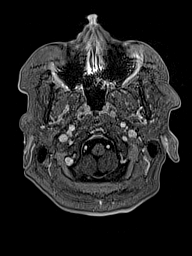
[im 36/160]
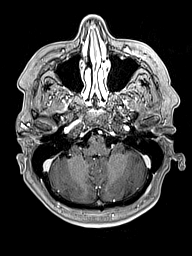
[im 54/160]
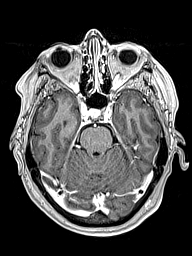
[im 71/160]
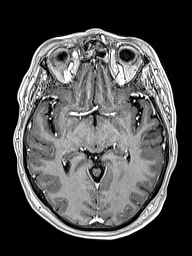
[im 89/160]
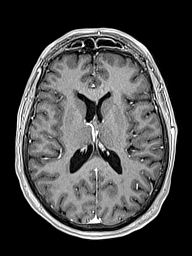
[im 107/160]
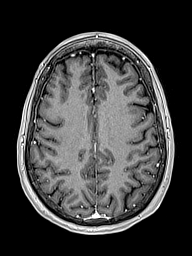
[im 124/160]
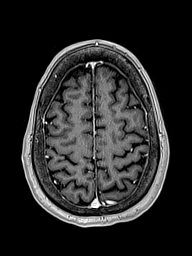
[im 142/160]
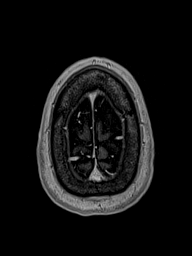
[im 160/160]
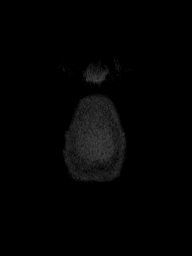

[Series 17: T1 post-contrast · coronal · 5.0mm · 0.43mm/px · 2 of 32 slices shown (2 of 3)]
[im 1/32]
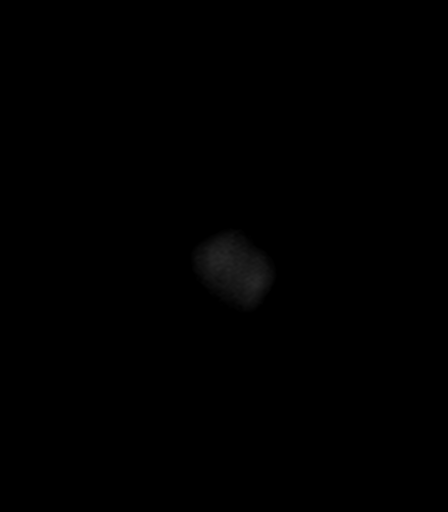
[im 32/32]
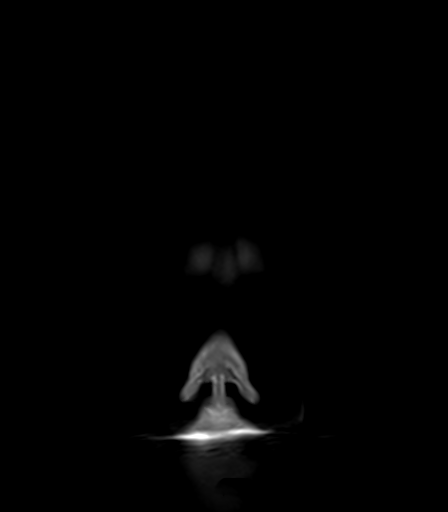

[Series 18: T1 post-contrast · sagittal · 5.0mm · 0.75mm/px · 1 of 24 slices shown (3 of 3)]
[im 1/24]
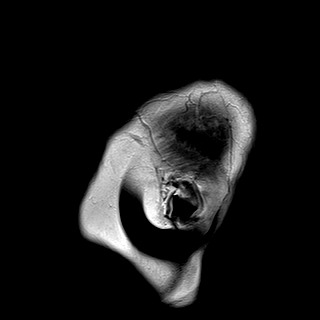

[48 of 48 positions shown; findings below may reference images not displayed]

FINDINGS: Brain: No acute infarction, hemorrhage, hydrocephalus, extra-axial
collection or mass lesion.

Scattered foci of T2 hyperintensity are seen within the white matter
of the cerebral hemispheres, nonspecific, most likely related to
chronic small vessel ischemia.

Vascular: Normal flow voids.

Skull and upper cervical spine: Normal marrow signal.

Sinuses/Orbits: Small mucous retention cyst in the right sphenoid
sinus. Mucosal thickening in the bilateral frontal sinuses and
ethmoid cells. The orbits are preserved.

Other: None.
IMPRESSION: 1. No evidence of intracranial metastatic disease.
2. Mild chronic small vessel ischemia.
3. Inflammatory paranasal sinus disease.

## 2019-05-12 MED ORDER — GADOBUTROL 1 MMOL/ML IV SOLN
7.0000 mL | Freq: Once | INTRAVENOUS | Status: AC | PRN
  Administered 2019-05-12: 7 mL via INTRAVENOUS

## 2019-05-12 NOTE — Telephone Encounter (Signed)
Schedule message sent to add day 3

## 2019-05-12 NOTE — Telephone Encounter (Signed)
MRI is negative for cancer.  Thanks

## 2019-05-12 NOTE — Telephone Encounter (Signed)
Pt confirmed appt for next week.

## 2019-05-12 NOTE — Telephone Encounter (Signed)
Scheduled appts per los. Called multiple times. No answer and not able to leave a msg. Mailed calendar. Will try to contact again

## 2019-05-12 NOTE — Progress Notes (Signed)
  Radiation Oncology         [phone] ________________________________  Name: Christy Abbott MRN: [id number]  Date: 05/14/2019  DOB: 11-24-1955  Simulation Verification Note    ICD-10-CM   1. Small cell lung cancer ([place])  C34.90     NARRATIVE: The patient was brought to the treatment unit and placed in the planned treatment position. The clinical setup was verified. Then port films were obtained and uploaded to the radiation oncology medical record software.  The treatment beams were carefully compared against the planned radiation fields. The position location and shape of the radiation fields was reviewed. They targeted volume of tissue appears to be appropriately covered by the radiation beams. Organs at risk appear to be excluded as planned.  Based on my personal review, I approved the simulation verification. The patient's treatment will proceed as planned.  -----------------------------------  Blair Promise, PhD, MD  This document serves as a record of services personally performed by Gery Pray, MD. It was created on his behalf by Clerance Lav, a trained medical scribe. The creation of this record is based on the scribe's personal observations and the provider's statements to them. This document has been checked and approved by the attending provider.

## 2019-05-12 NOTE — Telephone Encounter (Signed)
Patient returned call. Advised of all appts scheduled. Patient confirmed

## 2019-05-12 NOTE — Telephone Encounter (Signed)
She is asking for her infusion appts. Schedule message sent-pt needs a day 3 for etoposide .

## 2019-05-13 ENCOUNTER — Inpatient Hospital Stay: Payer: 59

## 2019-05-13 ENCOUNTER — Other Ambulatory Visit: Payer: Self-pay | Admitting: Medical Oncology

## 2019-05-13 ENCOUNTER — Encounter: Payer: Self-pay | Admitting: Internal Medicine

## 2019-05-13 DIAGNOSIS — C3411 Malignant neoplasm of upper lobe, right bronchus or lung: Secondary | ICD-10-CM | POA: Insufficient documentation

## 2019-05-13 DIAGNOSIS — Z95828 Presence of other vascular implants and grafts: Secondary | ICD-10-CM

## 2019-05-13 DIAGNOSIS — Z5111 Encounter for antineoplastic chemotherapy: Secondary | ICD-10-CM

## 2019-05-13 DIAGNOSIS — Z794 Long term (current) use of insulin: Secondary | ICD-10-CM | POA: Insufficient documentation

## 2019-05-13 DIAGNOSIS — Z9049 Acquired absence of other specified parts of digestive tract: Secondary | ICD-10-CM | POA: Insufficient documentation

## 2019-05-13 DIAGNOSIS — Z5189 Encounter for other specified aftercare: Secondary | ICD-10-CM | POA: Insufficient documentation

## 2019-05-13 DIAGNOSIS — E119 Type 2 diabetes mellitus without complications: Secondary | ICD-10-CM | POA: Insufficient documentation

## 2019-05-13 DIAGNOSIS — I10 Essential (primary) hypertension: Secondary | ICD-10-CM | POA: Insufficient documentation

## 2019-05-13 DIAGNOSIS — D701 Agranulocytosis secondary to cancer chemotherapy: Secondary | ICD-10-CM | POA: Insufficient documentation

## 2019-05-13 DIAGNOSIS — E0590 Thyrotoxicosis, unspecified without thyrotoxic crisis or storm: Secondary | ICD-10-CM | POA: Insufficient documentation

## 2019-05-13 DIAGNOSIS — Z79899 Other long term (current) drug therapy: Secondary | ICD-10-CM | POA: Insufficient documentation

## 2019-05-13 DIAGNOSIS — T451X5A Adverse effect of antineoplastic and immunosuppressive drugs, initial encounter: Secondary | ICD-10-CM | POA: Insufficient documentation

## 2019-05-13 DIAGNOSIS — Z87891 Personal history of nicotine dependence: Secondary | ICD-10-CM | POA: Insufficient documentation

## 2019-05-13 MED ORDER — PROCHLORPERAZINE MALEATE 10 MG PO TABS: 10.0000 mg | ORAL_TABLET | Freq: Four times a day (QID) | ORAL | 0 refills | Status: DC | PRN

## 2019-05-13 MED ORDER — LIDOCAINE-PRILOCAINE 2.5-2.5 % EX CREA: 1.0000 "application " | TOPICAL_CREAM | CUTANEOUS | 0 refills | Status: DC | PRN

## 2019-05-13 NOTE — Telephone Encounter (Signed)
Pt.notified

## 2019-05-14 ENCOUNTER — Inpatient Hospital Stay: Payer: 59

## 2019-05-14 ENCOUNTER — Other Ambulatory Visit: Payer: Self-pay

## 2019-05-14 ENCOUNTER — Other Ambulatory Visit: Payer: 59

## 2019-05-14 ENCOUNTER — Ambulatory Visit
Admission: RE | Admit: 2019-05-14 | Discharge: 2019-05-14 | Disposition: A | Discharge status: Home / Self Care | Payer: 59 | Source: Ambulatory Visit | Attending: Radiation Oncology | Admitting: Radiation Oncology

## 2019-05-14 ENCOUNTER — Ambulatory Visit: Payer: 59 | Admitting: Radiation Oncology

## 2019-05-14 ENCOUNTER — Encounter: Payer: Self-pay | Admitting: Internal Medicine

## 2019-05-14 VITALS — BP 132/82 | HR 77 | Temp 98.3°F | Resp 18

## 2019-05-14 DIAGNOSIS — Z833 Family history of diabetes mellitus: Secondary | ICD-10-CM | POA: Insufficient documentation

## 2019-05-14 DIAGNOSIS — C3492 Malignant neoplasm of unspecified part of left bronchus or lung: Secondary | ICD-10-CM

## 2019-05-14 DIAGNOSIS — E0590 Thyrotoxicosis, unspecified without thyrotoxic crisis or storm: Secondary | ICD-10-CM | POA: Insufficient documentation

## 2019-05-14 DIAGNOSIS — Z510 Encounter for antineoplastic radiation therapy: Secondary | ICD-10-CM | POA: Diagnosis present

## 2019-05-14 DIAGNOSIS — C3412 Malignant neoplasm of upper lobe, left bronchus or lung: Secondary | ICD-10-CM | POA: Diagnosis not present

## 2019-05-14 DIAGNOSIS — E119 Type 2 diabetes mellitus without complications: Secondary | ICD-10-CM | POA: Diagnosis not present

## 2019-05-14 DIAGNOSIS — Z807 Family history of other malignant neoplasms of lymphoid, hematopoietic and related tissues: Secondary | ICD-10-CM | POA: Diagnosis not present

## 2019-05-14 DIAGNOSIS — I4891 Unspecified atrial fibrillation: Secondary | ICD-10-CM | POA: Insufficient documentation

## 2019-05-14 DIAGNOSIS — I10 Essential (primary) hypertension: Secondary | ICD-10-CM | POA: Diagnosis not present

## 2019-05-14 DIAGNOSIS — R7989 Other specified abnormal findings of blood chemistry: Secondary | ICD-10-CM | POA: Insufficient documentation

## 2019-05-14 DIAGNOSIS — Z9049 Acquired absence of other specified parts of digestive tract: Secondary | ICD-10-CM | POA: Insufficient documentation

## 2019-05-14 DIAGNOSIS — Z794 Long term (current) use of insulin: Secondary | ICD-10-CM | POA: Insufficient documentation

## 2019-05-14 DIAGNOSIS — Z79899 Other long term (current) drug therapy: Secondary | ICD-10-CM | POA: Diagnosis not present

## 2019-05-14 DIAGNOSIS — C3490 Malignant neoplasm of unspecified part of unspecified bronchus or lung: Secondary | ICD-10-CM

## 2019-05-14 DIAGNOSIS — Z87891 Personal history of nicotine dependence: Secondary | ICD-10-CM | POA: Diagnosis not present

## 2019-05-14 DIAGNOSIS — Z95828 Presence of other vascular implants and grafts: Secondary | ICD-10-CM

## 2019-05-14 LAB — CBC WITH DIFFERENTIAL (CANCER CENTER ONLY)
Abs Immature Granulocytes: 0.02 10*3/uL (ref 0.00–0.07)
Basophils Absolute: 0 10*3/uL (ref 0.0–0.1)
Basophils Relative: 0 %
Eosinophils Absolute: 0.3 10*3/uL (ref 0.0–0.5)
Eosinophils Relative: 4 %
HCT: 41.1 % (ref 36.0–46.0)
Hemoglobin: 13.9 g/dL (ref 12.0–15.0)
Immature Granulocytes: 0 %
Lymphocytes Relative: 32 %
Lymphs Abs: 3 10*3/uL (ref 0.7–4.0)
MCH: 30.6 pg (ref 26.0–34.0)
MCHC: 33.8 g/dL (ref 30.0–36.0)
MCV: 90.5 fL (ref 80.0–100.0)
Monocytes Absolute: 0.7 10*3/uL (ref 0.1–1.0)
Monocytes Relative: 7 %
Neutro Abs: 5.2 10*3/uL (ref 1.7–7.7)
Neutrophils Relative %: 57 %
Platelet Count: 224 10*3/uL (ref 150–400)
RBC: 4.54 MIL/uL (ref 3.87–5.11)
RDW: 13.1 % (ref 11.5–15.5)
WBC Count: 9.2 10*3/uL (ref 4.0–10.5)
nRBC: 0 % (ref 0.0–0.2)

## 2019-05-14 LAB — CMP (CANCER CENTER ONLY)
ALT: 11 U/L (ref 0–44)
AST: 11 U/L — ABNORMAL LOW (ref 15–41)
Albumin: 3.5 g/dL (ref 3.5–5.0)
Alkaline Phosphatase: 94 U/L (ref 38–126)
Anion gap: 11 (ref 5–15)
BUN: 12 mg/dL (ref 8–23)
CO2: 27 mmol/L (ref 22–32)
Calcium: 9.3 mg/dL (ref 8.9–10.3)
Chloride: 103 mmol/L (ref 98–111)
Creatinine: 0.72 mg/dL (ref 0.44–1.00)
GFR, Est AFR Am: >60 mL/min (ref >60)
GFR, Estimated: >60 mL/min (ref >60)
Glucose, Bld: 220 mg/dL — ABNORMAL HIGH (ref 70–99)
Potassium: 3.4 mmol/L — ABNORMAL LOW (ref 3.5–5.1)
Sodium: 141 mmol/L (ref 135–145)
Total Bilirubin: 0.6 mg/dL (ref 0.3–1.2)
Total Protein: 6.8 g/dL (ref 6.5–8.1)

## 2019-05-14 LAB — MAGNESIUM: Magnesium: 1.6 mg/dL — ABNORMAL LOW (ref 1.7–2.4)

## 2019-05-14 MED ORDER — DEXAMETHASONE SODIUM PHOSPHATE 10 MG/ML IJ SOLN
INTRAMUSCULAR | Status: AC
  Filled 2019-05-14: qty 1

## 2019-05-14 MED ORDER — SODIUM CHLORIDE 0.9 % IV SOLN
Freq: Once | INTRAVENOUS | Status: AC
  Filled 2019-05-14: qty 250

## 2019-05-14 MED ORDER — SODIUM CHLORIDE 0.9 % IV SOLN
100.0000 mg/m2 | Freq: Once | INTRAVENOUS | Status: AC
  Administered 2019-05-14: 14:00:00 180 mg via INTRAVENOUS
  Filled 2019-05-14: qty 9

## 2019-05-14 MED ORDER — DEXAMETHASONE SODIUM PHOSPHATE 10 MG/ML IJ SOLN
10.0000 mg | Freq: Once | INTRAMUSCULAR | Status: AC
  Administered 2019-05-14: 10 mg via INTRAVENOUS

## 2019-05-14 MED ORDER — SODIUM CHLORIDE 0.9% FLUSH
10.0000 mL | INTRAVENOUS | Status: DC | PRN
  Administered 2019-05-14: 10 mL
  Filled 2019-05-14: qty 10

## 2019-05-14 MED ORDER — SODIUM CHLORIDE 0.9% FLUSH
10.0000 mL | INTRAVENOUS | Status: DC | PRN
  Administered 2019-05-14: 10 mL via INTRAVENOUS
  Filled 2019-05-14: qty 10

## 2019-05-14 MED ORDER — PALONOSETRON HCL INJECTION 0.25 MG/5ML
INTRAVENOUS | Status: AC
  Filled 2019-05-14: qty 5

## 2019-05-14 MED ORDER — POTASSIUM CHLORIDE 2 MEQ/ML IV SOLN
Freq: Once | INTRAVENOUS | Status: AC
  Filled 2019-05-14: qty 10

## 2019-05-14 MED ORDER — HEPARIN SOD (PORK) LOCK FLUSH 100 UNIT/ML IV SOLN
500.0000 [IU] | Freq: Once | INTRAVENOUS | Status: AC | PRN
  Administered 2019-05-14: 500 [IU]
  Filled 2019-05-14: qty 5

## 2019-05-14 MED ORDER — SODIUM CHLORIDE 0.9 % IV SOLN
150.0000 mg | Freq: Once | INTRAVENOUS | Status: AC
  Administered 2019-05-14: 150 mg via INTRAVENOUS
  Filled 2019-05-14: qty 5

## 2019-05-14 MED ORDER — SODIUM CHLORIDE 0.9 % IV SOLN
84.0000 mg/m2 | Freq: Once | INTRAVENOUS | Status: AC
  Administered 2019-05-14: 150 mg via INTRAVENOUS
  Filled 2019-05-14: qty 100

## 2019-05-14 MED ORDER — PALONOSETRON HCL INJECTION 0.25 MG/5ML
0.2500 mg | Freq: Once | INTRAVENOUS | Status: AC
  Administered 2019-05-14: 0.25 mg via INTRAVENOUS

## 2019-05-14 NOTE — Patient Instructions (Signed)
[facility] Discharge Instructions for Patients Receiving Chemotherapy  Today you received the following chemotherapy agents Etoposide; Cisplatin  To help prevent nausea and vomiting after your treatment, we encourage you to take your nausea medication as directed   If you develop nausea and vomiting that is not controlled by your nausea medication, call the clinic.   BELOW ARE SYMPTOMS THAT SHOULD BE REPORTED IMMEDIATELY:  *FEVER GREATER THAN 100.5 F  *CHILLS WITH OR WITHOUT FEVER  NAUSEA AND VOMITING THAT IS NOT CONTROLLED WITH YOUR NAUSEA MEDICATION  *UNUSUAL SHORTNESS OF BREATH  *UNUSUAL BRUISING OR BLEEDING  TENDERNESS IN MOUTH AND THROAT WITH OR WITHOUT PRESENCE OF ULCERS  *URINARY PROBLEMS  *BOWEL PROBLEMS  UNUSUAL RASH Items with * indicate a potential emergency and should be followed up as soon as possible.  Feel free to call the clinic should you have any questions or concerns. The clinic phone number is [phone].  Please show the [place] at check-in to the Emergency Department and triage nurse.  Etoposide, VP-16 injection What is this medicine? ETOPOSIDE, VP-16 (e toe POE side) is a chemotherapy drug. It is used to treat testicular cancer, lung cancer, and other cancers. This medicine may be used for other purposes; ask your health care provider or pharmacist if you have questions. COMMON BRAND NAME(S): Etopophos, Toposar, VePesid What should I tell my health care provider before I take this medicine? They need to know if you have any of these conditions:  infection  kidney disease  liver disease  low blood counts, like low white cell, platelet, or red cell counts  an unusual or allergic reaction to etoposide, other medicines, foods, dyes, or preservatives  pregnant or trying to get pregnant  breast-feeding How should I use this medicine? This medicine is for infusion into a vein. It is administered in a hospital or  clinic by a specially trained health care professional. Talk to your pediatrician regarding the use of this medicine in children. Special care may be needed. Overdosage: If you think you have taken too much of this medicine contact a poison control center or emergency room at once. NOTE: This medicine is only for you. Do not share this medicine with others. What if I miss a dose? It is important not to miss your dose. Call your doctor or health care professional if you are unable to keep an appointment. What may interact with this medicine? This medicine may interact with the following medications:  warfarin This list may not describe all possible interactions. Give your health care provider a list of all the medicines, herbs, non-prescription drugs, or dietary supplements you use. Also tell them if you smoke, drink alcohol, or use illegal drugs. Some items may interact with your medicine. What should I watch for while using this medicine? Visit your doctor for checks on your progress. This drug may make you feel generally unwell. This is not uncommon, as chemotherapy can affect healthy cells as well as cancer cells. Report any side effects. Continue your course of treatment even though you feel ill unless your doctor tells you to stop. In some cases, you may be given additional medicines to help with side effects. Follow all directions for their use. Call your doctor or health care professional for advice if you get a fever, chills or sore throat, or other symptoms of a cold or flu. Do not treat yourself. This drug decreases your body's ability to fight infections. Try to avoid being around people  who are sick. This medicine may increase your risk to bruise or bleed. Call your doctor or health care professional if you notice any unusual bleeding. Talk to your doctor about your risk of cancer. You may be more at risk for certain types of cancers if you take this medicine. Do not become pregnant  while taking this medicine or for at least 6 months after stopping it. Women should inform their doctor if they wish to become pregnant or think they might be pregnant. Women of child-bearing potential will need to have a negative pregnancy test before starting this medicine. There is a potential for serious side effects to an unborn child. Talk to your health care professional or pharmacist for more information. Do not breast-feed an infant while taking this medicine. Men must use a latex condom during sexual contact with a woman while taking this medicine and for at least 4 months after stopping it. A latex condom is needed even if you have had a vasectomy. Contact your doctor right away if your partner becomes pregnant. Do not donate sperm while taking this medicine and for at least 4 months after you stop taking this medicine. Men should inform their doctors if they wish to father a child. This medicine may lower sperm counts. What side effects may I notice from receiving this medicine? Side effects that you should report to your doctor or health care professional as soon as possible:  allergic reactions like skin rash, itching or hives, swelling of the face, lips, or tongue  low blood counts - this medicine may decrease the number of white blood cells, red blood cells, and platelets. You may be at increased risk for infections and bleeding  nausea, vomiting  redness, blistering, peeling or loosening of the skin, including inside the mouth  signs and symptoms of infection like fever; chills; cough; sore throat; pain or trouble passing urine  signs and symptoms of low red blood cells or anemia such as unusually weak or tired; feeling faint or lightheaded; falls; breathing problems  unusual bruising or bleeding Side effects that usually do not require medical attention (report to your doctor or health care professional if they continue or are bothersome):  changes in taste  diarrhea  hair  loss  loss of appetite  mouth sores This list may not describe all possible side effects. Call your doctor for medical advice about side effects. You may report side effects to FDA at 1-800-FDA-1088. Where should I keep my medicine? This drug is given in a hospital or clinic and will not be stored at home. NOTE: This sheet is a summary. It may not cover all possible information. If you have questions about this medicine, talk to your doctor, pharmacist, or health care provider.  2020 Elsevier/Gold Standard (2018-04-24 16:57:15)  Cisplatin injection What is this medicine? CISPLATIN (SIS pla tin) is a chemotherapy drug. It targets fast dividing cells, like cancer cells, and causes these cells to die. This medicine is used to treat many types of cancer like bladder, ovarian, and testicular cancers. This medicine may be used for other purposes; ask your health care provider or pharmacist if you have questions. COMMON BRAND NAME(S): Platinol, Platinol -AQ What should I tell my health care provider before I take this medicine? They need to know if you have any of these conditions:  eye disease, vision problems  hearing problems  kidney disease  low blood counts, like white cells, platelets, or red blood cells  tingling of the fingers  or toes, or other nerve disorder  an unusual or allergic reaction to cisplatin, carboplatin, oxaliplatin, other medicines, foods, dyes, or preservatives  pregnant or trying to get pregnant  breast-feeding How should I use this medicine? This drug is given as an infusion into a vein. It is administered in a hospital or clinic by a specially trained health care professional. Talk to your pediatrician regarding the use of this medicine in children. Special care may be needed. Overdosage: If you think you have taken too much of this medicine contact a poison control center or emergency room at once. NOTE: This medicine is only for you. Do not share this  medicine with others. What if I miss a dose? It is important not to miss a dose. Call your doctor or health care professional if you are unable to keep an appointment. What may interact with this medicine? This medicine may interact with the following medications:  foscarnet  certain antibiotics like amikacin, gentamicin, neomycin, polymyxin B, streptomycin, tobramycin, vancomycin This list may not describe all possible interactions. Give your health care provider a list of all the medicines, herbs, non-prescription drugs, or dietary supplements you use. Also tell them if you smoke, drink alcohol, or use illegal drugs. Some items may interact with your medicine. What should I watch for while using this medicine? Your condition will be monitored carefully while you are receiving this medicine. You will need important blood work done while you are taking this medicine. This drug may make you feel generally unwell. This is not uncommon, as chemotherapy can affect healthy cells as well as cancer cells. Report any side effects. Continue your course of treatment even though you feel ill unless your doctor tells you to stop. This medicine may increase your risk of getting an infection. Call your healthcare professional for advice if you get a fever, chills, or sore throat, or other symptoms of a cold or flu. Do not treat yourself. Try to avoid being around people who are sick. Avoid taking medicines that contain aspirin, acetaminophen, ibuprofen, naproxen, or ketoprofen unless instructed by your healthcare professional. These medicines may hide a fever. This medicine may increase your risk to bruise or bleed. Call your doctor or health care professional if you notice any unusual bleeding. Be careful brushing and flossing your teeth or using a toothpick because you may get an infection or bleed more easily. If you have any dental work done, tell your dentist you are receiving this medicine. Do not become  pregnant while taking this medicine or for 14 months after stopping it. Women should inform their healthcare professional if they wish to become pregnant or think they might be pregnant. Men should not father a child while taking this medicine and for 11 months after stopping it. There is potential for serious side effects to an unborn child. Talk to your healthcare professional for more information. Do not breast-feed an infant while taking this medicine. This medicine has caused ovarian failure in some women. This medicine may make it more difficult to get pregnant. Talk to your healthcare professional if you are concerned about your fertility. This medicine has caused decreased sperm counts in some men. This may make it more difficult to father a child. Talk to your healthcare professional if you are concerned about your fertility. Drink fluids as directed while you are taking this medicine. This will help protect your kidneys. Call your doctor or health care professional if you get diarrhea. Do not treat yourself. What side  effects may I notice from receiving this medicine? Side effects that you should report to your doctor or health care professional as soon as possible:  allergic reactions like skin rash, itching or hives, swelling of the face, lips, or tongue  blurred vision  changes in vision  decreased hearing or ringing of the ears  nausea, vomiting  pain, redness, or irritation at site where injected  pain, tingling, numbness in the hands or feet  signs and symptoms of bleeding such as bloody or black, tarry stools; red or dark brown urine; spitting up blood or brown material that looks like coffee grounds; red spots on the skin; unusual bruising or bleeding from the eyes, gums, or nose  signs and symptoms of infection like fever; chills; cough; sore throat; pain or trouble passing urine  signs and symptoms of kidney injury like trouble passing urine or change in the amount of  urine  signs and symptoms of low red blood cells or anemia such as unusually weak or tired; feeling faint or lightheaded; falls; breathing problems Side effects that usually do not require medical attention (report to your doctor or health care professional if they continue or are bothersome):  loss of appetite  mouth sores  muscle cramps This list may not describe all possible side effects. Call your doctor for medical advice about side effects. You may report side effects to FDA at 1-800-FDA-1088. Where should I keep my medicine? This drug is given in a hospital or clinic and will not be stored at home. NOTE: This sheet is a summary. It may not cover all possible information. If you have questions about this medicine, talk to your doctor, pharmacist, or health care provider.  2020 Elsevier/Gold Standard (2018-02-22 15:59:17)

## 2019-05-14 NOTE — Progress Notes (Signed)
Patient requested to stay accessed for tomorrows treatment. biopatch and take home dressing applied

## 2019-05-14 NOTE — Progress Notes (Signed)
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.  Pt gave me consent to apply in her behalf so I completed theonlineapplicationw/ the[employer] for [employer]. The app is pending so I will notify her of the outcome once I receive it.  Pt would also like to apply for the [place] so she will bring proof of income on 05/15/19.  I gave her an expense sheet and my card for any questions or concerns she may have in the future.

## 2019-05-15 ENCOUNTER — Ambulatory Visit: Payer: 59 | Admitting: Radiation Oncology

## 2019-05-15 ENCOUNTER — Ambulatory Visit
Admission: RE | Admit: 2019-05-15 | Discharge: 2019-05-15 | Disposition: A | Discharge status: Home / Self Care | Payer: 59 | Source: Ambulatory Visit | Attending: Radiation Oncology | Admitting: Radiation Oncology

## 2019-05-15 ENCOUNTER — Other Ambulatory Visit: Payer: Self-pay

## 2019-05-15 ENCOUNTER — Inpatient Hospital Stay: Payer: 59

## 2019-05-15 ENCOUNTER — Ambulatory Visit: Payer: 59

## 2019-05-15 VITALS — BP 154/99 | HR 91 | Temp 97.8°F

## 2019-05-15 DIAGNOSIS — C3492 Malignant neoplasm of unspecified part of left bronchus or lung: Secondary | ICD-10-CM

## 2019-05-15 DIAGNOSIS — Z510 Encounter for antineoplastic radiation therapy: Secondary | ICD-10-CM | POA: Diagnosis not present

## 2019-05-15 MED ORDER — SODIUM CHLORIDE 0.9% FLUSH
10.0000 mL | INTRAVENOUS | Status: DC | PRN
  Administered 2019-05-15: 10 mL
  Filled 2019-05-15: qty 10

## 2019-05-15 MED ORDER — DEXAMETHASONE SODIUM PHOSPHATE 10 MG/ML IJ SOLN
INTRAMUSCULAR | Status: AC
  Filled 2019-05-15: qty 1

## 2019-05-15 MED ORDER — HEPARIN SOD (PORK) LOCK FLUSH 100 UNIT/ML IV SOLN
500.0000 [IU] | Freq: Once | INTRAVENOUS | Status: AC | PRN
  Administered 2019-05-15: 500 [IU]
  Filled 2019-05-15: qty 5

## 2019-05-15 MED ORDER — DEXAMETHASONE SODIUM PHOSPHATE 10 MG/ML IJ SOLN
10.0000 mg | Freq: Once | INTRAMUSCULAR | Status: AC
  Administered 2019-05-15: 10 mg via INTRAVENOUS

## 2019-05-15 MED ORDER — SODIUM CHLORIDE 0.9 % IV SOLN
100.0000 mg/m2 | Freq: Once | INTRAVENOUS | Status: AC
  Administered 2019-05-15: 180 mg via INTRAVENOUS
  Filled 2019-05-15: qty 9

## 2019-05-15 MED ORDER — SODIUM CHLORIDE 0.9 % IV SOLN
Freq: Once | INTRAVENOUS | Status: AC
  Filled 2019-05-15: qty 250

## 2019-05-15 NOTE — Patient Instructions (Signed)
[facility] Discharge Instructions for Patients Receiving Chemotherapy  Today you received the following chemotherapy agents etoposide  To help prevent nausea and vomiting after your treatment, we encourage you to take your nausea medication as directed.   If you develop nausea and vomiting that is not controlled by your nausea medication, call the clinic.   BELOW ARE SYMPTOMS THAT SHOULD BE REPORTED IMMEDIATELY:  *FEVER GREATER THAN 100.5 F  *CHILLS WITH OR WITHOUT FEVER  NAUSEA AND VOMITING THAT IS NOT CONTROLLED WITH YOUR NAUSEA MEDICATION  *UNUSUAL SHORTNESS OF BREATH  *UNUSUAL BRUISING OR BLEEDING  TENDERNESS IN MOUTH AND THROAT WITH OR WITHOUT PRESENCE OF ULCERS  *URINARY PROBLEMS  *BOWEL PROBLEMS  UNUSUAL RASH Items with * indicate a potential emergency and should be followed up as soon as possible.  Feel free to call the clinic should you have any questions or concerns. The clinic phone number is [phone].  Please show the [place] at check-in to the Emergency Department and triage nurse.  Etoposide, VP-16 injection What is this medicine? ETOPOSIDE, VP-16 (e toe POE side) is a chemotherapy drug. It is used to treat testicular cancer, lung cancer, and other cancers. This medicine may be used for other purposes; ask your health care provider or pharmacist if you have questions. COMMON BRAND NAME(S): Etopophos, Toposar, VePesid What should I tell my health care provider before I take this medicine? They need to know if you have any of these conditions:  infection  kidney disease  liver disease  low blood counts, like low white cell, platelet, or red cell counts  an unusual or allergic reaction to etoposide, other medicines, foods, dyes, or preservatives  pregnant or trying to get pregnant  breast-feeding How should I use this medicine? This medicine is for infusion into a vein. It is administered in a hospital or clinic by a  specially trained health care professional. Talk to your pediatrician regarding the use of this medicine in children. Special care may be needed. Overdosage: If you think you have taken too much of this medicine contact a poison control center or emergency room at once. NOTE: This medicine is only for you. Do not share this medicine with others. What if I miss a dose? It is important not to miss your dose. Call your doctor or health care professional if you are unable to keep an appointment. What may interact with this medicine? This medicine may interact with the following medications:  warfarin This list may not describe all possible interactions. Give your health care provider a list of all the medicines, herbs, non-prescription drugs, or dietary supplements you use. Also tell them if you smoke, drink alcohol, or use illegal drugs. Some items may interact with your medicine. What should I watch for while using this medicine? Visit your doctor for checks on your progress. This drug may make you feel generally unwell. This is not uncommon, as chemotherapy can affect healthy cells as well as cancer cells. Report any side effects. Continue your course of treatment even though you feel ill unless your doctor tells you to stop. In some cases, you may be given additional medicines to help with side effects. Follow all directions for their use. Call your doctor or health care professional for advice if you get a fever, chills or sore throat, or other symptoms of a cold or flu. Do not treat yourself. This drug decreases your body's ability to fight infections. Try to avoid being around people who  are sick. This medicine may increase your risk to bruise or bleed. Call your doctor or health care professional if you notice any unusual bleeding. Talk to your doctor about your risk of cancer. You may be more at risk for certain types of cancers if you take this medicine. Do not become pregnant while taking  this medicine or for at least 6 months after stopping it. Women should inform their doctor if they wish to become pregnant or think they might be pregnant. Women of child-bearing potential will need to have a negative pregnancy test before starting this medicine. There is a potential for serious side effects to an unborn child. Talk to your health care professional or pharmacist for more information. Do not breast-feed an infant while taking this medicine. Men must use a latex condom during sexual contact with a woman while taking this medicine and for at least 4 months after stopping it. A latex condom is needed even if you have had a vasectomy. Contact your doctor right away if your partner becomes pregnant. Do not donate sperm while taking this medicine and for at least 4 months after you stop taking this medicine. Men should inform their doctors if they wish to father a child. This medicine may lower sperm counts. What side effects may I notice from receiving this medicine? Side effects that you should report to your doctor or health care professional as soon as possible:  allergic reactions like skin rash, itching or hives, swelling of the face, lips, or tongue  low blood counts - this medicine may decrease the number of white blood cells, red blood cells, and platelets. You may be at increased risk for infections and bleeding  nausea, vomiting  redness, blistering, peeling or loosening of the skin, including inside the mouth  signs and symptoms of infection like fever; chills; cough; sore throat; pain or trouble passing urine  signs and symptoms of low red blood cells or anemia such as unusually weak or tired; feeling faint or lightheaded; falls; breathing problems  unusual bruising or bleeding Side effects that usually do not require medical attention (report to your doctor or health care professional if they continue or are bothersome):  changes in taste  diarrhea  hair loss  loss  of appetite  mouth sores This list may not describe all possible side effects. Call your doctor for medical advice about side effects. You may report side effects to FDA at 1-800-FDA-1088. Where should I keep my medicine? This drug is given in a hospital or clinic and will not be stored at home. NOTE: This sheet is a summary. It may not cover all possible information. If you have questions about this medicine, talk to your doctor, pharmacist, or health care provider.  2020 Elsevier/Gold Standard (2018-04-24 16:57:15)

## 2019-05-16 ENCOUNTER — Ambulatory Visit
Admission: RE | Admit: 2019-05-16 | Discharge: 2019-05-16 | Disposition: A | Discharge status: Home / Self Care | Payer: 59 | Source: Ambulatory Visit | Attending: Radiation Oncology | Admitting: Radiation Oncology

## 2019-05-16 ENCOUNTER — Inpatient Hospital Stay: Payer: 59

## 2019-05-16 ENCOUNTER — Telehealth: Payer: Self-pay | Admitting: Medical Oncology

## 2019-05-16 ENCOUNTER — Ambulatory Visit: Payer: 59

## 2019-05-16 ENCOUNTER — Encounter: Payer: Self-pay | Admitting: Internal Medicine

## 2019-05-16 ENCOUNTER — Encounter: Payer: Self-pay | Admitting: Family

## 2019-05-16 ENCOUNTER — Other Ambulatory Visit: Payer: Self-pay

## 2019-05-16 VITALS — BP 170/86 | HR 79 | Temp 98.0°F | Resp 16

## 2019-05-16 DIAGNOSIS — C3492 Malignant neoplasm of unspecified part of left bronchus or lung: Secondary | ICD-10-CM

## 2019-05-16 DIAGNOSIS — Z510 Encounter for antineoplastic radiation therapy: Secondary | ICD-10-CM | POA: Diagnosis not present

## 2019-05-16 MED ORDER — SODIUM CHLORIDE 0.9 % IV SOLN
Freq: Once | INTRAVENOUS | Status: AC
  Filled 2019-05-16: qty 250

## 2019-05-16 MED ORDER — SODIUM CHLORIDE 0.9 % IV SOLN
100.0000 mg/m2 | Freq: Once | INTRAVENOUS | Status: AC
  Administered 2019-05-16: 09:00:00 180 mg via INTRAVENOUS
  Filled 2019-05-16: qty 9

## 2019-05-16 MED ORDER — DEXAMETHASONE SODIUM PHOSPHATE 10 MG/ML IJ SOLN
INTRAMUSCULAR | Status: AC
  Filled 2019-05-16: qty 1

## 2019-05-16 MED ORDER — SODIUM CHLORIDE 0.9% FLUSH
10.0000 mL | INTRAVENOUS | Status: DC | PRN
  Administered 2019-05-16: 10 mL
  Filled 2019-05-16: qty 10

## 2019-05-16 MED ORDER — DEXAMETHASONE SODIUM PHOSPHATE 10 MG/ML IJ SOLN
10.0000 mg | Freq: Once | INTRAMUSCULAR | Status: AC
  Administered 2019-05-16: 09:00:00 10 mg via INTRAVENOUS

## 2019-05-16 MED ORDER — HEPARIN SOD (PORK) LOCK FLUSH 100 UNIT/ML IV SOLN
500.0000 [IU] | Freq: Once | INTRAVENOUS | Status: AC | PRN
  Administered 2019-05-16: 10:00:00 500 [IU]
  Filled 2019-05-16: qty 5

## 2019-05-16 NOTE — Telephone Encounter (Signed)
Pt here for day 2 -she did fine last night -no problems,

## 2019-05-16 NOTE — Patient Instructions (Signed)
[facility] Discharge Instructions for Patients Receiving Chemotherapy  Today you received the following chemotherapy agents etoposide  To help prevent nausea and vomiting after your treatment, we encourage you to take your nausea medication as directed.   If you develop nausea and vomiting that is not controlled by your nausea medication, call the clinic.   BELOW ARE SYMPTOMS THAT SHOULD BE REPORTED IMMEDIATELY:  *FEVER GREATER THAN 100.5 F  *CHILLS WITH OR WITHOUT FEVER  NAUSEA AND VOMITING THAT IS NOT CONTROLLED WITH YOUR NAUSEA MEDICATION  *UNUSUAL SHORTNESS OF BREATH  *UNUSUAL BRUISING OR BLEEDING  TENDERNESS IN MOUTH AND THROAT WITH OR WITHOUT PRESENCE OF ULCERS  *URINARY PROBLEMS  *BOWEL PROBLEMS  UNUSUAL RASH Items with * indicate a potential emergency and should be followed up as soon as possible.  Feel free to call the clinic should you have any questions or concerns. The clinic phone number is [phone].  Please show the [facility] at check-in to the Emergency Department and triage nurse.  Etoposide, VP-16 injection What is this medicine? ETOPOSIDE, VP-16 (e toe POE side) is a chemotherapy drug. It is used to treat testicular cancer, lung cancer, and other cancers. This medicine may be used for other purposes; ask your health care provider or pharmacist if you have questions. COMMON BRAND NAME(S): Etopophos, Toposar, VePesid What should I tell my health care provider before I take this medicine? They need to know if you have any of these conditions:  infection  kidney disease  liver disease  low blood counts, like low white cell, platelet, or red cell counts  an unusual or allergic reaction to etoposide, other medicines, foods, dyes, or preservatives  pregnant or trying to get pregnant  breast-feeding How should I use this medicine? This medicine is for infusion into a vein. It is administered in a hospital or clinic by a  specially trained health care professional. Talk to your pediatrician regarding the use of this medicine in children. Special care may be needed. Overdosage: If you think you have taken too much of this medicine contact a poison control center or emergency room at once. NOTE: This medicine is only for you. Do not share this medicine with others. What if I miss a dose? It is important not to miss your dose. Call your doctor or health care professional if you are unable to keep an appointment. What may interact with this medicine? This medicine may interact with the following medications:  warfarin This list may not describe all possible interactions. Give your health care provider a list of all the medicines, herbs, non-prescription drugs, or dietary supplements you use. Also tell them if you smoke, drink alcohol, or use illegal drugs. Some items may interact with your medicine. What should I watch for while using this medicine? Visit your doctor for checks on your progress. This drug may make you feel generally unwell. This is not uncommon, as chemotherapy can affect healthy cells as well as cancer cells. Report any side effects. Continue your course of treatment even though you feel ill unless your doctor tells you to stop. In some cases, you may be given additional medicines to help with side effects. Follow all directions for their use. Call your doctor or health care professional for advice if you get a fever, chills or sore throat, or other symptoms of a cold or flu. Do not treat yourself. This drug decreases your body's ability to fight infections. Try to avoid being around people who  are sick. This medicine may increase your risk to bruise or bleed. Call your doctor or health care professional if you notice any unusual bleeding. Talk to your doctor about your risk of cancer. You may be more at risk for certain types of cancers if you take this medicine. Do not become pregnant while taking  this medicine or for at least 6 months after stopping it. Women should inform their doctor if they wish to become pregnant or think they might be pregnant. Women of child-bearing potential will need to have a negative pregnancy test before starting this medicine. There is a potential for serious side effects to an unborn child. Talk to your health care professional or pharmacist for more information. Do not breast-feed an infant while taking this medicine. Men must use a latex condom during sexual contact with a woman while taking this medicine and for at least 4 months after stopping it. A latex condom is needed even if you have had a vasectomy. Contact your doctor right away if your partner becomes pregnant. Do not donate sperm while taking this medicine and for at least 4 months after you stop taking this medicine. Men should inform their doctors if they wish to father a child. This medicine may lower sperm counts. What side effects may I notice from receiving this medicine? Side effects that you should report to your doctor or health care professional as soon as possible:  allergic reactions like skin rash, itching or hives, swelling of the face, lips, or tongue  low blood counts - this medicine may decrease the number of white blood cells, red blood cells, and platelets. You may be at increased risk for infections and bleeding  nausea, vomiting  redness, blistering, peeling or loosening of the skin, including inside the mouth  signs and symptoms of infection like fever; chills; cough; sore throat; pain or trouble passing urine  signs and symptoms of low red blood cells or anemia such as unusually weak or tired; feeling faint or lightheaded; falls; breathing problems  unusual bruising or bleeding Side effects that usually do not require medical attention (report to your doctor or health care professional if they continue or are bothersome):  changes in taste  diarrhea  hair loss  loss  of appetite  mouth sores This list may not describe all possible side effects. Call your doctor for medical advice about side effects. You may report side effects to FDA at 1-800-FDA-1088. Where should I keep my medicine? This drug is given in a hospital or clinic and will not be stored at home. NOTE: This sheet is a summary. It may not cover all possible information. If you have questions about this medicine, talk to your doctor, pharmacist, or health care provider.  2020 [employer] (2018-04-24 16:57:15)

## 2019-05-16 NOTE — Progress Notes (Signed)
Pt wasapprovedforFulphila from3/5/21to3/4/22for up to $10,000.The program reduces pt's copay responsibility to $0.

## 2019-05-17 ENCOUNTER — Emergency Department (HOSPITAL_COMMUNITY): Payer: 59

## 2019-05-17 ENCOUNTER — Emergency Department (HOSPITAL_COMMUNITY)
Admission: EM | Admit: 2019-05-17 | Discharge: 2019-05-17 | Disposition: A | Discharge status: Home / Self Care | Payer: 59 | Attending: Emergency Medicine | Admitting: Emergency Medicine

## 2019-05-17 ENCOUNTER — Encounter (HOSPITAL_COMMUNITY): Payer: Self-pay

## 2019-05-17 ENCOUNTER — Other Ambulatory Visit: Payer: Self-pay

## 2019-05-17 ENCOUNTER — Encounter: Payer: Self-pay | Admitting: Family

## 2019-05-17 DIAGNOSIS — Z87891 Personal history of nicotine dependence: Secondary | ICD-10-CM | POA: Insufficient documentation

## 2019-05-17 DIAGNOSIS — N12 Tubulo-interstitial nephritis, not specified as acute or chronic: Secondary | ICD-10-CM | POA: Diagnosis not present

## 2019-05-17 DIAGNOSIS — M545 Low back pain: Secondary | ICD-10-CM | POA: Diagnosis not present

## 2019-05-17 DIAGNOSIS — C3490 Malignant neoplasm of unspecified part of unspecified bronchus or lung: Secondary | ICD-10-CM | POA: Diagnosis not present

## 2019-05-17 DIAGNOSIS — I10 Essential (primary) hypertension: Secondary | ICD-10-CM | POA: Insufficient documentation

## 2019-05-17 DIAGNOSIS — R1030 Lower abdominal pain, unspecified: Secondary | ICD-10-CM | POA: Insufficient documentation

## 2019-05-17 DIAGNOSIS — Z79899 Other long term (current) drug therapy: Secondary | ICD-10-CM | POA: Insufficient documentation

## 2019-05-17 DIAGNOSIS — R300 Dysuria: Secondary | ICD-10-CM | POA: Diagnosis present

## 2019-05-17 DIAGNOSIS — E119 Type 2 diabetes mellitus without complications: Secondary | ICD-10-CM | POA: Insufficient documentation

## 2019-05-17 DIAGNOSIS — Z9221 Personal history of antineoplastic chemotherapy: Secondary | ICD-10-CM | POA: Diagnosis not present

## 2019-05-17 DIAGNOSIS — Z794 Long term (current) use of insulin: Secondary | ICD-10-CM | POA: Insufficient documentation

## 2019-05-17 DIAGNOSIS — R319 Hematuria, unspecified: Secondary | ICD-10-CM | POA: Insufficient documentation

## 2019-05-17 DIAGNOSIS — Z923 Personal history of irradiation: Secondary | ICD-10-CM | POA: Diagnosis not present

## 2019-05-17 LAB — URINALYSIS, ROUTINE W REFLEX MICROSCOPIC
Bilirubin Urine: NEGATIVE
Glucose, UA: >=500 mg/dL — AB
Ketones, ur: NEGATIVE mg/dL
Nitrite: POSITIVE — AB
Protein, ur: 100 mg/dL — AB
RBC / HPF: >50 RBC/hpf — ABNORMAL HIGH (ref 0–5)
Specific Gravity, Urine: 1.011 (ref 1.005–1.030)
WBC, UA: >50 WBC/hpf — ABNORMAL HIGH (ref 0–5)
pH: 6 (ref 5.0–8.0)

## 2019-05-17 LAB — CBC WITH DIFFERENTIAL/PLATELET
Abs Immature Granulocytes: 0.08 10*3/uL — ABNORMAL HIGH (ref 0.00–0.07)
Basophils Absolute: 0 10*3/uL (ref 0.0–0.1)
Basophils Relative: 0 %
Eosinophils Absolute: 0 10*3/uL (ref 0.0–0.5)
Eosinophils Relative: 0 %
HCT: 44.5 % (ref 36.0–46.0)
Hemoglobin: 15.2 g/dL — ABNORMAL HIGH (ref 12.0–15.0)
Immature Granulocytes: 1 %
Lymphocytes Relative: 9 %
Lymphs Abs: 1.5 10*3/uL (ref 0.7–4.0)
MCH: 30.6 pg (ref 26.0–34.0)
MCHC: 34.2 g/dL (ref 30.0–36.0)
MCV: 89.5 fL (ref 80.0–100.0)
Monocytes Absolute: 0.1 10*3/uL (ref 0.1–1.0)
Monocytes Relative: 1 %
Neutro Abs: 15 10*3/uL — ABNORMAL HIGH (ref 1.7–7.7)
Neutrophils Relative %: 89 %
Platelets: 247 10*3/uL (ref 150–400)
RBC: 4.97 MIL/uL (ref 3.87–5.11)
RDW: 13 % (ref 11.5–15.5)
WBC: 16.8 10*3/uL — ABNORMAL HIGH (ref 4.0–10.5)
nRBC: 0 % (ref 0.0–0.2)

## 2019-05-17 LAB — BASIC METABOLIC PANEL
Anion gap: 12 (ref 5–15)
BUN: 34 mg/dL — ABNORMAL HIGH (ref 8–23)
CO2: 25 mmol/L (ref 22–32)
Calcium: 9 mg/dL (ref 8.9–10.3)
Chloride: 91 mmol/L — ABNORMAL LOW (ref 98–111)
Creatinine, Ser: 0.76 mg/dL (ref 0.44–1.00)
GFR calc Af Amer: >60 mL/min (ref >60)
GFR calc non Af Amer: >60 mL/min (ref >60)
Glucose, Bld: 383 mg/dL — ABNORMAL HIGH (ref 70–99)
Potassium: 3.1 mmol/L — ABNORMAL LOW (ref 3.5–5.1)
Sodium: 128 mmol/L — ABNORMAL LOW (ref 135–145)

## 2019-05-17 IMAGING — CT CT RENAL STONE PROTOCOL
2 of 4 series · 16 of 46 positions shown, 18 images · non-contrast
Comparison: CT abdomen pelvis [DATE], PET-CT [DATE]

CLINICAL DATA: Hematuria, unknown cause, bilateral lower abdominal
and flank pain

EXAM:
CT ABDOMEN AND PELVIS WITHOUT CONTRAST
TECHNIQUE: Multidetector CT imaging of the abdomen and pelvis was performed
following the standard protocol without IV contrast.

[Series 3: axial st · axial · 0.78mm/px · z∈[-437,-57]mm · 13 of 85 slices shown, 15 images]
[im 5/85  soft-tissue]
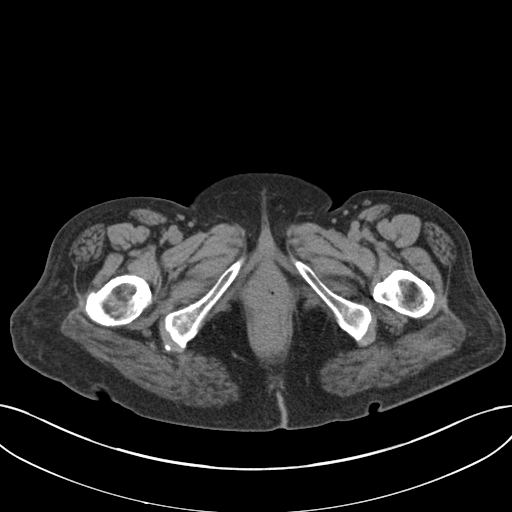
[im 5/85  bone]
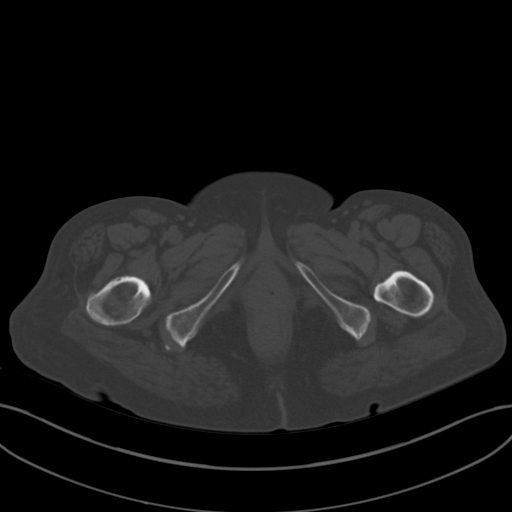
[im 13/85  soft-tissue]
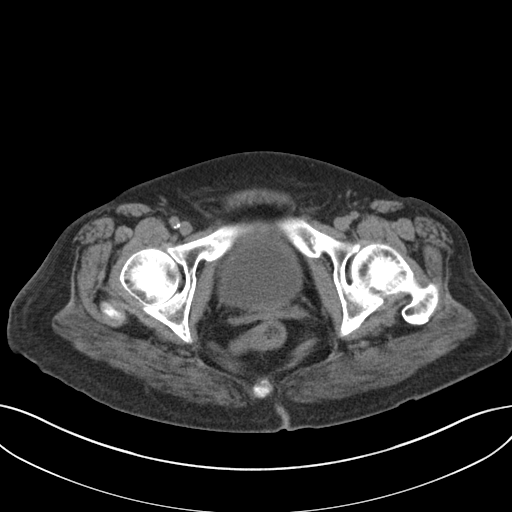
[im 17/85  soft-tissue]
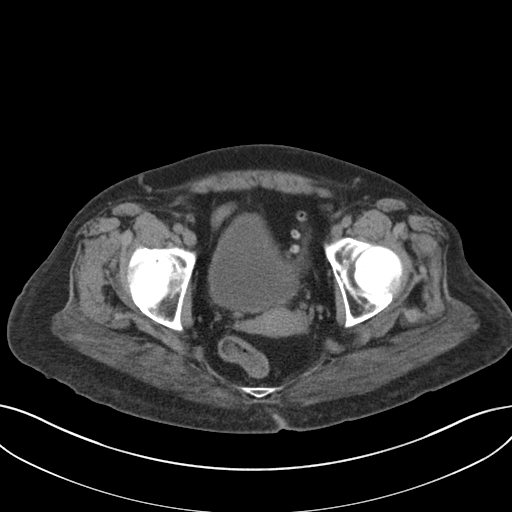
[im 25/85  soft-tissue]
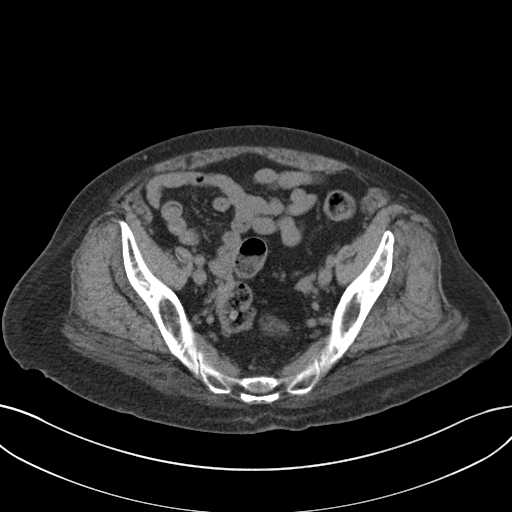
[im 29/85  soft-tissue]
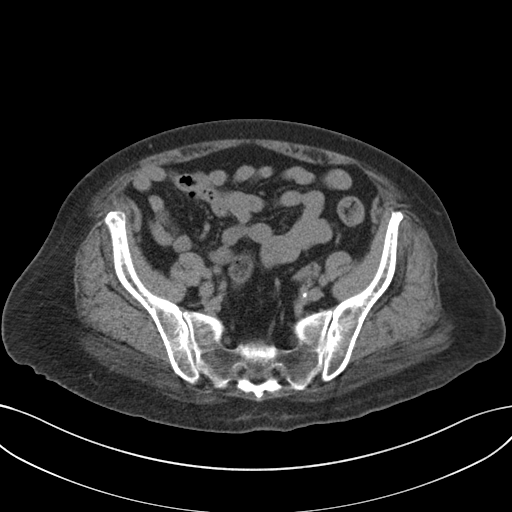
[im 37/85  soft-tissue]
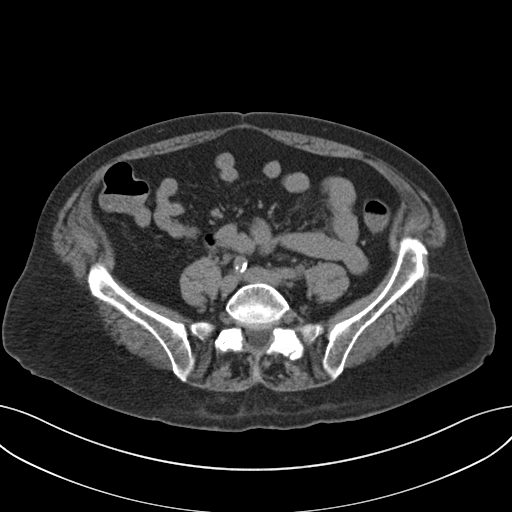
[im 45/85  soft-tissue]
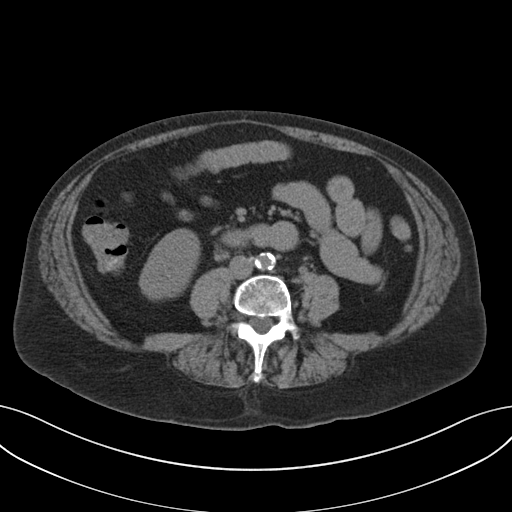
[im 49/85  soft-tissue]
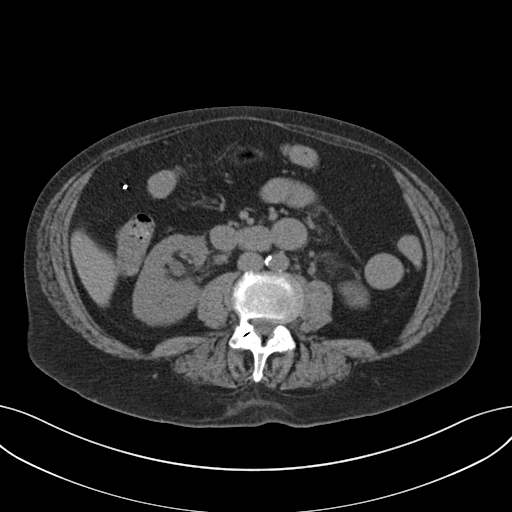
[im 57/85  soft-tissue]
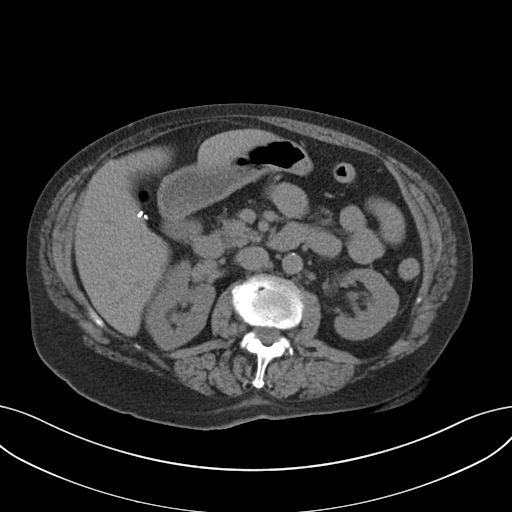
[im 57/85  bone]
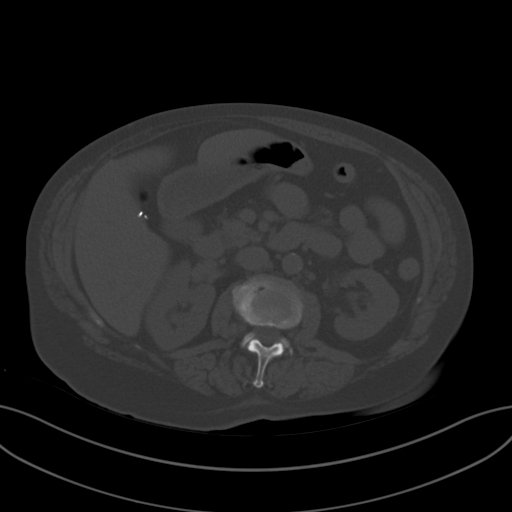
[im 61/85  soft-tissue]
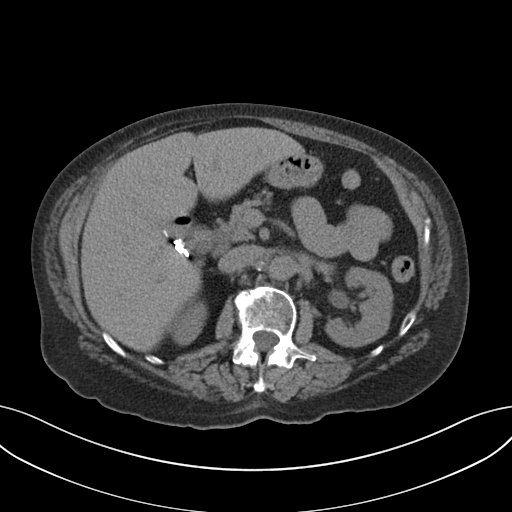
[im 69/85  soft-tissue]
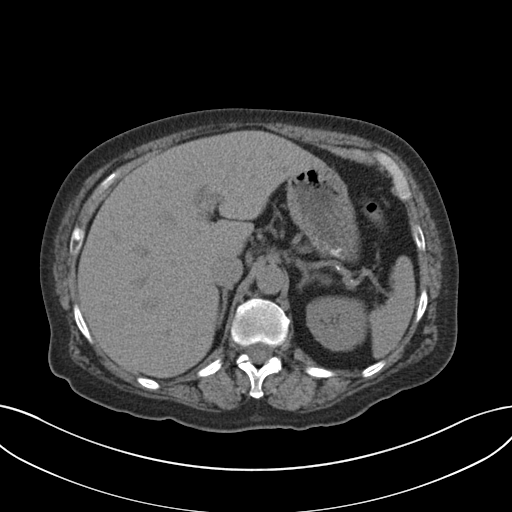
[im 73/85  soft-tissue]
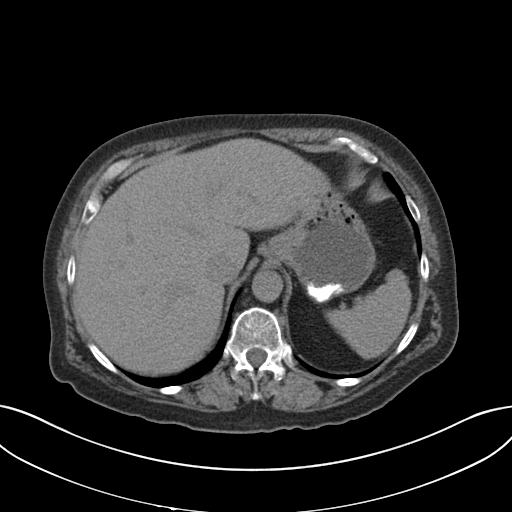
[im 81/85  soft-tissue]
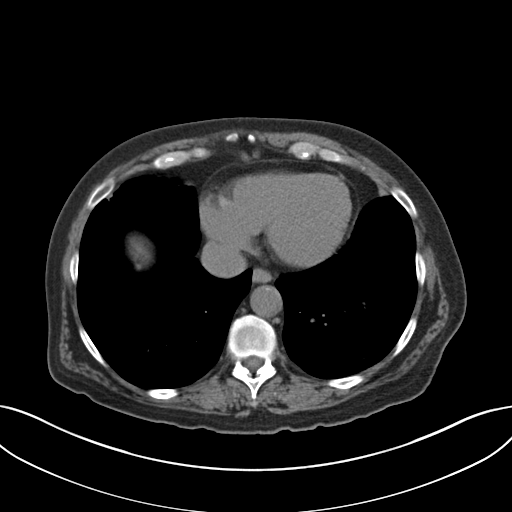

[Series 5: coronal · coronal · 0.75mm/px · 3 of 135 slices shown]
[im 45/135  soft-tissue]
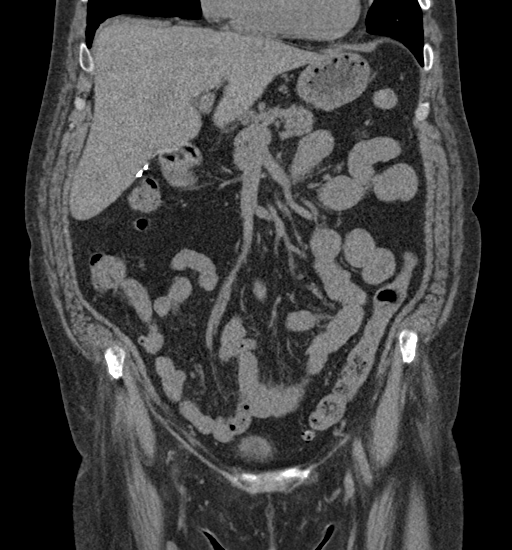
[im 60/135  soft-tissue]
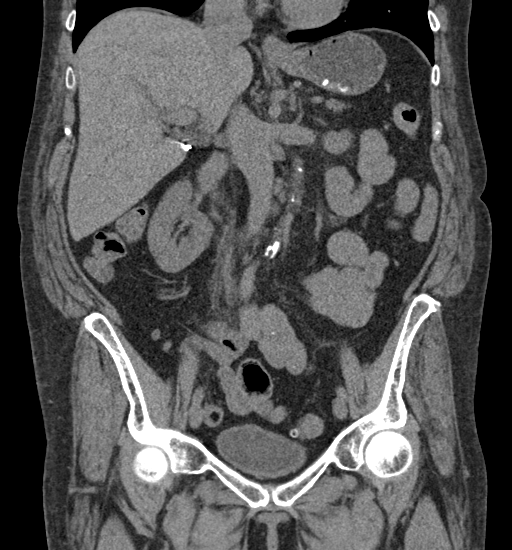
[im 75/135  soft-tissue]
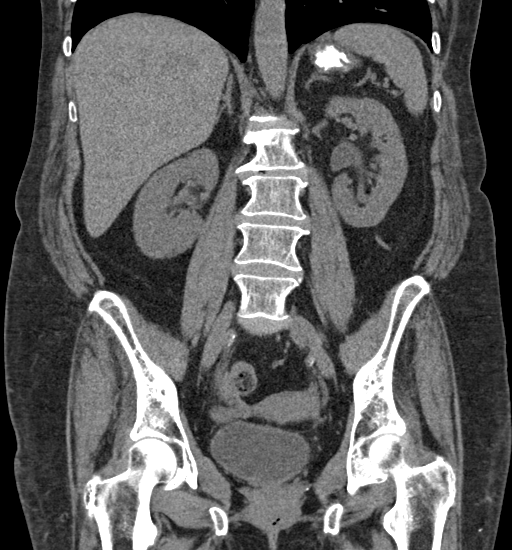

[16 of 46 positions shown; findings below may reference images not displayed]

FINDINGS: Lower chest: Stable bandlike area of scarring and/or atelectasis in
the lingula. 6 mm nodule in the left lower lobe appears slightly
decreased from 7 mm on comparison PET-CT. Normal heart size. No
pericardial effusion.

Hepatobiliary: No focal liver abnormality is seen. Patient is post
cholecystectomy. Slight prominence of the biliary tree likely
related to reservoir effect. No calcified intraductal gallstones.

Pancreas: Unremarkable. No pancreatic ductal dilatation or
surrounding inflammatory changes.

Spleen: Normal in size without focal abnormality.

Adrenals/Urinary Tract: Normal adrenal glands. No visible or
obstructing urolithiasis however there is mild bilateral symmetric
urinary tract dilatation without frank hydronephrosis. There is mild
periureteral hazy stranding and bladder wall thickening. No visible
or contour deforming renal lesions.

Stomach/Bowel: Distal esophagus, stomach and duodenal sweep are
unremarkable. No small bowel wall thickening or dilatation. No
evidence of obstruction. A normal appendix is visualized. No colonic
dilatation or wall thickening. Scattered colonic diverticula without
focal pericolonic inflammation to suggest diverticulitis.

Vascular/Lymphatic: Atherosclerotic plaque within the normal caliber
aorta and branch vessels. No suspicious or enlarged lymph nodes in
the included lymphatic chains.

Reproductive: Anteverted uterus. No concerning adnexal lesions.

Other: No bowel containing hernia. Mild posterior body wall edema.
No abdominopelvic free fluid or air.

Musculoskeletal: Multilevel degenerative changes are present in the
imaged portions of the spine. No acute osseous abnormality or
suspicious osseous lesion. No suspicious lytic or blastic osseous
lesions. Mild levocurvature of the spine is similar to comparison
studies.
IMPRESSION: 1. Mild bilateral symmetric urinary tract dilatation without frank
hydronephrosis. There is mild periureteral hazy stranding and
bladder wall thickening which can be seen with ascending urinary
tract infection.
2. Colonic diverticulosis without CT evidence of diverticulitis.
3. Left lower lobe 6 mm pulmonary nodule appears slightly decreased
in size from 7 mm on comparison PET-CT.
4. Aortic Atherosclerosis ([I0]-[I0]).

## 2019-05-17 MED ORDER — CEPHALEXIN 500 MG PO CAPS: 500.0000 mg | ORAL_CAPSULE | Freq: Four times a day (QID) | ORAL | 0 refills | Status: DC

## 2019-05-17 MED ORDER — MORPHINE SULFATE (PF) 4 MG/ML IV SOLN
4.0000 mg | Freq: Once | INTRAVENOUS | Status: AC
  Administered 2019-05-17: 4 mg via INTRAVENOUS
  Filled 2019-05-17: qty 1

## 2019-05-17 MED ORDER — ONDANSETRON HCL 4 MG/2ML IJ SOLN
4.0000 mg | Freq: Once | INTRAMUSCULAR | Status: AC
  Administered 2019-05-17: 4 mg via INTRAVENOUS
  Filled 2019-05-17: qty 2

## 2019-05-17 MED ORDER — OXYCODONE HCL 5 MG PO TABS
5.0000 mg | ORAL_TABLET | Freq: Once | ORAL | Status: AC
  Administered 2019-05-17: 5 mg via ORAL
  Filled 2019-05-17: qty 1

## 2019-05-17 MED ORDER — SODIUM CHLORIDE 0.9 % IV SOLN
1.0000 g | Freq: Once | INTRAVENOUS | Status: AC
  Administered 2019-05-17: 1 g via INTRAVENOUS
  Filled 2019-05-17: qty 10

## 2019-05-17 MED ORDER — SODIUM CHLORIDE 0.9 % IV BOLUS
500.0000 mL | Freq: Once | INTRAVENOUS | Status: AC
  Administered 2019-05-17: 500 mL via INTRAVENOUS

## 2019-05-17 MED ORDER — POTASSIUM CHLORIDE CRYS ER 20 MEQ PO TBCR
20.0000 meq | EXTENDED_RELEASE_TABLET | Freq: Once | ORAL | Status: AC
  Administered 2019-05-17: 20 meq via ORAL
  Filled 2019-05-17: qty 1

## 2019-05-17 MED ORDER — OXYCODONE HCL 5 MG PO CAPS: 5.0000 mg | ORAL_CAPSULE | ORAL | 0 refills | Status: DC | PRN

## 2019-05-17 NOTE — ED Provider Notes (Signed)
[facility] DEPT Provider Note   CSN: [id number] Arrival date & time: 05/17/19  1449     History Chief Complaint  Patient presents with  . Hematuria  . Back Pain  . Abdominal Pain    Christy Abbott is a 64 y.o. female.  She has a history of lung cancer and is on chemo and radiation.  She is complaining of of dysuria hematuria suprapubic pain low back pain that started last evening.  She rates the pain is severe.  She has tried nothing for it.  She said she has had hematuria once before in the setting of urinary tract infection.  No history of kidney stones.  No known fever chest pain shortness of breath nausea or vomiting.  No vaginal bleeding or discharge.  Has a remote history of a flutter and currently not on anticoagulation.  The history is provided by the patient.  Hematuria This is a new problem. The current episode started yesterday. The problem occurs hourly. The problem has not changed since onset.Associated symptoms include abdominal pain. Pertinent negatives include no chest pain, no headaches and no shortness of breath. Exacerbated by: urinating. Nothing relieves the symptoms. She has tried nothing for the symptoms. The treatment provided no relief.  Abdominal Pain Pain location:  Suprapubic Pain quality: stabbing   Pain radiates to:  Back Pain severity:  Severe Onset quality:  Gradual Duration:  2 days Progression:  Waxing and waning Chronicity:  New Context: not trauma   Relieved by:  None tried Worsened by:  Urination Ineffective treatments:  None tried Associated symptoms: hematuria   Associated symptoms: no chest pain, no cough, no diarrhea, no dysuria, no fever, no hematemesis, no hematochezia, no melena, no nausea, no shortness of breath, no sore throat, no vaginal bleeding, no vaginal discharge and no vomiting        Past Medical History:  Diagnosis Date  . Arthritis   . Atrial flutter [facility])    s/p CTI by Dr Lovena Le 02/2013   . Chest pain   . Diabetes mellitus without complication ([place])   . Dysrhythmia    hx of Aflutter  . 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 Hemochromatosis associated with compound heterozygous mutation in HFE gene ([place]) 12/10/2017  . Hypertension   . Hyperthyroidism 10/31/2014    Patient Active Problem List   Diagnosis Date Noted  . Primary lung small cell carcinoma, left ([place]) 05/08/2019  . Encounter for antineoplastic chemotherapy 05/08/2019  . Goals of care, counseling/discussion 05/08/2019  . Elevated ferritin 04/30/2019  . Acute on chronic respiratory failure with hypoxemia ([place]) 04/09/2019  . Abnormal CT of the chest 04/09/2019  . DKA, type 2 ([place]) 03/22/2019  . SVT (supraventricular tachycardia) ([place]) 03/22/2019  . Viral illness 03/22/2019  . Sepsis ([place]) 03/22/2019  . Small cell lung cancer ([place]) 03/22/2019  . Hemochromatosis associated with compound heterozygous mutation in HFE gene ([place]) 12/10/2017  . Stress at home 04/10/2016  . Carpal tunnel syndrome of left wrist 04/10/2016  . Insomnia 10/18/2015  . H/O malignant neuroendocrine tumor 10/31/2014  . Hyperthyroidism 10/31/2014  . GERD (gastroesophageal reflux disease) 07/28/2014  . HTN (hypertension) 07/28/2014  . 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 Hypokalemia 05/20/2014  . Tobacco use disorder 12/03/2013  . Diabetes type 2, controlled ([place]) 04/21/2013  . Atrial flutter ([place]) 02/24/2013  . Essential hypertension, benign 02/24/2013    Past Surgical History:  Procedure Laterality Date  . ABLATION  03-04-2013  CTI by Dr Lovena Le for atrial flutter  . ATRIAL FLUTTER ABLATION N/A 03/04/2013   Procedure: ATRIAL FLUTTER ABLATION;  Surgeon: Evans Lance, MD;  Location: [facility] CATH LAB;  Service: Cardiovascular;  Laterality: N/A;  . CHOLECYSTECTOMY  1982  . COLONOSCOPY W/ POLYPECTOMY     x 2  . ESOPHAGOGASTRODUODENOSCOPY N/A 05/20/2014   Procedure: ESOPHAGOGASTRODUODENOSCOPY (EGD);  Surgeon: Teena Irani, MD;  Location: [facility];  Service: Endoscopy;  Laterality: N/A;  . IR IMAGING GUIDED PORT INSERTION  05/09/2019  . TONSILLECTOMY  1972 ?  Marland Kitchen VIDEO BRONCHOSCOPY WITH ENDOBRONCHIAL ULTRASOUND Left 04/30/2019   Procedure: VIDEO BRONCHOSCOPY WITH ENDOBRONCHIAL ULTRASOUND;  Surgeon: Garner Nash, DO;  Location: [facility];  Service: Pulmonary;  Laterality: Left;     OB History   No obstetric history on file.     Family History  Problem Relation Age of Onset  . Atrial fibrillation Mother   . Arrhythmia Mother   . Diabetes Mother   . Hodgkin's lymphoma Father     Social History   Tobacco Use  . Smoking status: Former Smoker    Packs/day: 1.00    Types: Cigarettes    Quit date: 03/21/2019    Years since quitting: 0.1  . Smokeless tobacco: Never Used  . Tobacco comment: started age 38 , quit for a year many times  Substance Use Topics  . Alcohol use: No    Alcohol/week: 0.0 standard drinks  . Drug use: No    Home Medications Prior to Admission medications   Medication Sig Start Date End Date Taking? Authorizing Provider  acetaminophen (TYLENOL) 500 MG tablet Take 1,000 mg by mouth every 6 (six) hours as needed for moderate pain or headache.    [provider]  albuterol (VENTOLIN HFA) 108 (90 Base) MCG/ACT inhaler Inhale 2 puffs into the lungs every 6 (six) hours as needed for wheezing or shortness of breath. Patient not taking: Reported on 04/25/2019 03/28/19   Aline [place], MD  Bioflavonoid Products (ESTER C PO) Take 1 tablet by mouth daily.    [provider]  Calcium Carb-Cholecalciferol (CALTRATE 600+D3 PO) Take 1 tablet by mouth daily.    [provider]  carvedilol (COREG) 12.5 MG tablet Take 1 tablet (12.5 mg total) by mouth in the morning and at bedtime. 05/05/19   Jettie Booze, MD  diphenhydramine-acetaminophen (TYLENOL PM) 25-500 MG TABS tablet Take 2 tablets by mouth at bedtime.    [provider]  flecainide (TAMBOCOR)  50 MG tablet TAKE 1 TABLET BY MOUTH TWICE DAILY, MAY TAKE AN ADDITIONAL 1 TABLET AS NEEDED. Please keep upcoming appt in January. Thank you Patient taking differently: Take 50 mg by mouth See admin instructions. Take 50 mg by mouth twice daily, may take a third 50 mg dose as needed for palpitations 02/18/19   Jettie Booze, MD  furosemide (LASIX) 20 MG tablet Take 1 tablet (20 mg total) by mouth daily as needed. Patient not taking: Reported on 05/08/2019 04/08/19   Imogene Burn, PA-C  glimepiride (AMARYL) 4 MG tablet Take 1 tablet (4 mg total) by mouth daily with breakfast. 05/05/19   [name], NP  guaiFENesin-dextromethorphan (ROBITUSSIN DM) 100-10 MG/5ML syrup Take 5 mLs by mouth every 4 (four) hours as needed for cough. Patient not taking: Reported on 05/02/2019 03/28/19   Aline [facility], MD  insulin glargine (LANTUS) 100 unit/mL SOPN Inject 0.1 mLs (10 Units total) into the skin daily. Patient taking differently: Inject 10 Units into the  skin at bedtime.  04/01/19   [name], NP  Insulin Pen Needle (B-D UF III MINI PEN NEEDLES) 31G X 5 MM MISC Use with insulin pen 04/01/19   [name], NP  Lancets 30G MISC Check sugar twice daily. 06/09/18   [name], NP  lidocaine-prilocaine (EMLA) cream Apply 1 application topically as needed. Using a cotton ball , apply a small amount of cream to the skin over the porta cath site. Do not rub in the cream. Cover with Plastic wrap. 05/13/19   Curt Bears, MD  lisinopril-hydrochlorothiazide (PRINZIDE,ZESTORETIC) 20-25 MG tablet Take 1 tablet by mouth daily. 05/28/18   [name], NP  metFORMIN (GLUCOPHAGE) 500 MG tablet Take 2 tablets (1,000 mg total) by mouth 2 (two) times daily. 07/26/18   [name], NP  methimazole (TAPAZOLE) 10 MG tablet TAKE 1 & 1/2 (ONE & ONE-HALF) TABLETS BY MOUTH ONCE DAILY Patient taking differently: Take 15 mg by mouth daily.  04/10/19   [name], NP  Omega-3  Fatty Acids (FISH OIL) 1200 MG CAPS Take 1,200 mg by mouth daily.     [provider]  potassium chloride SA (KLOR-CON) 20 MEQ tablet TAKE 1  BY MOUTH ONCE DAILY Patient taking differently: Take 20 mEq by mouth daily.  03/05/19   [name], NP  prochlorperazine (COMPAZINE) 10 MG tablet Take 1 tablet (10 mg total) by mouth every 6 (six) hours as needed for nausea or vomiting. 05/13/19   Curt Bears, MD  simvastatin (ZOCOR) 10 MG tablet TAKE 1 TABLET BY MOUTH AT BEDTIME Patient taking differently: Take 10 mg by mouth at bedtime.  03/05/19   [name], NP  Tetrahydrozoline HCl (VISINE OP) Place 1 drop into both eyes daily as needed (irritation).    [provider]    Allergies    Jardiance [empagliflozin] and Trazodone and nefazodone  Review of Systems   Review of Systems  Constitutional: Negative for fever.  HENT: Negative for sore throat.   Eyes: Negative for visual disturbance.  Respiratory: Negative for cough and shortness of breath.   Cardiovascular: Negative for chest pain.  Gastrointestinal: Positive for abdominal pain. Negative for diarrhea, hematemesis, hematochezia, melena, nausea and vomiting.  Genitourinary: Positive for hematuria. Negative for dysuria, vaginal bleeding and vaginal discharge.  Musculoskeletal: Positive for back pain.  Skin: Negative for rash.  Neurological: Negative for headaches.    Physical Exam Updated Vital Signs BP (!) 182/102 (BP Location: Right Arm)   Pulse 73   Temp 97.8 F (36.6 C) (Oral)   Resp 16   Ht 5\' 4"  (1.626 m)   Wt 66.7 kg   SpO2 97%   BMI 25.23 kg/m   Physical Exam Vitals and nursing note reviewed.  Constitutional:      General: She is not in acute distress.    Appearance: She is well-developed.  HENT:     Head: Normocephalic and atraumatic.  Eyes:     Conjunctiva/sclera: Conjunctivae normal.  Cardiovascular:     Rate and Rhythm: Normal rate and regular rhythm.     Heart sounds: No  murmur.  Pulmonary:     Effort: Pulmonary effort is normal. No respiratory distress.     Breath sounds: Normal breath sounds.  Abdominal:     Palpations: Abdomen is soft.     Tenderness: There is abdominal tenderness in the suprapubic area. There is no right CVA tenderness, left CVA tenderness, guarding or rebound.  Musculoskeletal:        General: No deformity or  signs of injury. Normal range of motion.     Cervical back: Neck supple.  Skin:    General: Skin is warm and dry.     Capillary Refill: Capillary refill takes less than 2 seconds.  Neurological:     General: No focal deficit present.     Mental Status: She is alert.     Gait: Gait normal.     ED Results / Procedures / Treatments   Labs (all labs ordered are listed, but only abnormal results are displayed) Labs Reviewed  URINALYSIS, ROUTINE W REFLEX MICROSCOPIC - Abnormal; Notable for the following components:      Result Value   APPearance CLOUDY (*)    Glucose, UA >=500 (*)    Hgb urine dipstick LARGE (*)    Protein, ur 100 (*)    Nitrite POSITIVE (*)    Leukocytes,Ua MODERATE (*)    RBC / HPF >50 (*)    WBC, UA >50 (*)    Bacteria, UA RARE (*)    All other components within normal limits  BASIC METABOLIC PANEL - Abnormal; Notable for the following components:   Sodium 128 (*)    Potassium 3.1 (*)    Chloride 91 (*)    Glucose, Bld 383 (*)    BUN 34 (*)    All other components within normal limits  CBC WITH DIFFERENTIAL/PLATELET - Abnormal; Notable for the following components:   WBC 16.8 (*)    Hemoglobin 15.2 (*)    Neutro Abs 15.0 (*)    Abs Immature Granulocytes 0.08 (*)    All other components within normal limits  URINE CULTURE    EKG None  Radiology CT Renal Stone Study  Result Date: 05/17/2019 CLINICAL DATA:  Hematuria, unknown cause, bilateral lower abdominal and flank pain EXAM: CT ABDOMEN AND PELVIS WITHOUT CONTRAST TECHNIQUE: Multidetector CT imaging of the abdomen and pelvis was  performed following the standard protocol without IV contrast. COMPARISON:  CT abdomen pelvis 03/24/2019, PET-CT 04/22/2019 FINDINGS: Lower chest: Stable bandlike area of scarring and/or atelectasis in the lingula. 6 mm nodule in the left lower lobe appears slightly decreased from 7 mm on comparison PET-CT. Normal heart size. No pericardial effusion. Hepatobiliary: No focal liver abnormality is seen. Patient is post cholecystectomy. Slight prominence of the biliary tree likely related to reservoir effect. No calcified intraductal gallstones. Pancreas: Unremarkable. No pancreatic ductal dilatation or surrounding inflammatory changes. Spleen: Normal in size without focal abnormality. Adrenals/Urinary Tract: Normal adrenal glands. No visible or obstructing urolithiasis however there is mild bilateral symmetric urinary tract dilatation without frank hydronephrosis. There is mild periureteral hazy stranding and bladder wall thickening. No visible or contour deforming renal lesions. Stomach/Bowel: Distal esophagus, stomach and duodenal sweep are unremarkable. No small bowel wall thickening or dilatation. No evidence of obstruction. A normal appendix is visualized. No colonic dilatation or wall thickening. Scattered colonic diverticula without focal pericolonic inflammation to suggest diverticulitis. Vascular/Lymphatic: Atherosclerotic plaque within the normal caliber aorta and branch vessels. No suspicious or enlarged lymph nodes in the included lymphatic chains. Reproductive: Anteverted uterus. No concerning adnexal lesions. Other: No bowel containing hernia. Mild posterior body wall edema. No abdominopelvic free fluid or air. Musculoskeletal: Multilevel degenerative changes are present in the imaged portions of the spine. No acute osseous abnormality or suspicious osseous lesion. No suspicious lytic or blastic osseous lesions. Mild levocurvature of the spine is similar to comparison studies. IMPRESSION: 1. Mild  bilateral symmetric urinary tract dilatation without frank hydronephrosis. There is mild periureteral  hazy stranding and bladder wall thickening which can be seen with ascending urinary tract infection. 2. Colonic diverticulosis without CT evidence of diverticulitis. 3. Left lower lobe 6 mm pulmonary nodule appears slightly decreased in size from 7 mm on comparison PET-CT. 4. Aortic Atherosclerosis (ICD10-I70.0). Electronically Signed   By: Lovena Le M.D.   On: 05/17/2019 17:13    Procedures Procedures (including critical care time)  Medications Ordered in ED Medications  morphine 4 MG/ML injection 4 mg (4 mg Intravenous Given 05/17/19 1538)  ondansetron (ZOFRAN) injection 4 mg (4 mg Intravenous Given 05/17/19 1538)  sodium chloride 0.9 % bolus 500 mL (0 mLs Intravenous Stopped 05/17/19 1650)  cefTRIAXone (ROCEPHIN) 1 g in sodium chloride 0.9 % 100 mL IVPB (0 g Intravenous Stopped 05/17/19 1634)  oxyCODONE (Oxy IR/ROXICODONE) immediate release tablet 5 mg (5 mg Oral Given 05/17/19 1651)  potassium chloride SA (KLOR-CON) CR tablet 20 mEq (20 mEq Oral Given 05/17/19 1651)    ED Course  I have reviewed the triage vital signs and the nursing notes.  Pertinent labs & imaging results that were available during my care of the patient were reviewed by me and considered in my medical decision making (see chart for details).  Clinical Course as of May 18 1019  Sat May 17, 2019  1533 Differential includes UTI, renal colic, bladder tumor,   [MB]  1533 Urine coming back with greater than 50 reds greater than 50 whites nitrite positive.  Will send for culture and give 1 g of ceftriaxone.   [MB]  8016 Patient's labs showing elevated white count and elevated hemoglobin probably due to some hemoconcentration.  Glucose elevated 383 and potassium mildly low at 3.1.  Will give some oral potassium and oral pain medicine.  She said the morphine helped a lot and her rates her pain has improved.  We will do a CT KUB to  make sure it is not a stone.   [MB]  62 CT showing some periurethral haziness but no kidney stone.   [MB]  73 Discussed with Dr. Simeon Craft such oncology.  She felt outpatient treatment would be reasonable.  Asked if I could send a message to Dr. Earlie Server regarding this plan so he can arrange close follow-up.   [MB]    Clinical Course User Index [MB] Hayden Rasmussen, MD   MDM Rules/Calculators/A&P                       Final Clinical Impression(s) / ED Diagnoses Final diagnoses:  Pyelonephritis  Malignant neoplasm of lung, unspecified laterality, unspecified part of lung ([place])    Rx / DC Orders ED Discharge Orders         Ordered    cephALEXin (KEFLEX) 500 MG capsule  4 times daily     05/17/19 1735    oxycodone (OXY-IR) 5 MG capsule  Every 4 hours PRN     05/17/19 1735           Hayden Rasmussen, MD 05/18/19 1022

## 2019-05-17 NOTE — ED Triage Notes (Signed)
Patient is currently receiving chemo and radiation.  Patient states she developed hematuria, lower back pain and lower abdominal pain last night.

## 2019-05-17 NOTE — Discharge Instructions (Addendum)
You were seen in the emergency department for low abdominal pain back pain and blood in your urine.  Your work-up was significant for urinary tract infection.  We are treating you with antibiotics and pain medicine.  Please contact your oncologist on Monday for close follow-up.  If you experience high fevers or worsening symptoms please return to the emergency department.

## 2019-05-18 ENCOUNTER — Encounter: Payer: Self-pay | Admitting: Internal Medicine

## 2019-05-19 ENCOUNTER — Ambulatory Visit (INDEPENDENT_AMBULATORY_CARE_PROVIDER_SITE_OTHER): Payer: 59 | Admitting: Family

## 2019-05-19 ENCOUNTER — Inpatient Hospital Stay: Payer: 59

## 2019-05-19 ENCOUNTER — Encounter: Payer: Self-pay | Admitting: Family

## 2019-05-19 ENCOUNTER — Other Ambulatory Visit: Payer: Self-pay

## 2019-05-19 ENCOUNTER — Ambulatory Visit: Payer: 59

## 2019-05-19 ENCOUNTER — Telehealth: Payer: Self-pay | Admitting: Family

## 2019-05-19 ENCOUNTER — Ambulatory Visit
Admission: RE | Admit: 2019-05-19 | Discharge: 2019-05-19 | Disposition: A | Discharge status: Home / Self Care | Payer: 59 | Source: Ambulatory Visit | Attending: Radiation Oncology | Admitting: Radiation Oncology

## 2019-05-19 VITALS — BP 121/70 | HR 74 | Temp 97.6°F | Resp 16

## 2019-05-19 DIAGNOSIS — E876 Hypokalemia: Secondary | ICD-10-CM

## 2019-05-19 DIAGNOSIS — N12 Tubulo-interstitial nephritis, not specified as acute or chronic: Secondary | ICD-10-CM

## 2019-05-19 DIAGNOSIS — Z794 Long term (current) use of insulin: Secondary | ICD-10-CM

## 2019-05-19 DIAGNOSIS — C3492 Malignant neoplasm of unspecified part of left bronchus or lung: Secondary | ICD-10-CM

## 2019-05-19 DIAGNOSIS — Z510 Encounter for antineoplastic radiation therapy: Secondary | ICD-10-CM | POA: Diagnosis not present

## 2019-05-19 DIAGNOSIS — E1165 Type 2 diabetes mellitus with hyperglycemia: Secondary | ICD-10-CM | POA: Diagnosis not present

## 2019-05-19 DIAGNOSIS — C3490 Malignant neoplasm of unspecified part of unspecified bronchus or lung: Secondary | ICD-10-CM | POA: Diagnosis not present

## 2019-05-19 MED ORDER — PEGFILGRASTIM-JMDB 6 MG/0.6ML ~~LOC~~ SOSY
PREFILLED_SYRINGE | SUBCUTANEOUS | Status: AC
  Filled 2019-05-19: qty 0.6

## 2019-05-19 MED ORDER — PEGFILGRASTIM-JMDB 6 MG/0.6ML ~~LOC~~ SOSY: 6.0000 mg | PREFILLED_SYRINGE | Freq: Once | SUBCUTANEOUS | Status: DC

## 2019-05-19 MED ORDER — INSULIN ASPART 100 UNIT/ML FLEXPEN: PEN_INJECTOR | SUBCUTANEOUS | 1 refills | Status: DC

## 2019-05-19 NOTE — Patient Instructions (Signed)

## 2019-05-19 NOTE — Telephone Encounter (Signed)
Appointment was scheduled for today at 12:20 this is when patient will be available due to getting chemo this morning.

## 2019-05-19 NOTE — Patient Instructions (Addendum)
Please continue lantus 10 units once daily.  Continue amaryl at 4 units once daily.  Novolog sliding scale- check sugar and inject 3 times daily before meals as below:  <200-   Zero units 201-250 4 units 251-300 6 units 301-350 8 units 351-400 10 units >400             12 units and contact us.

## 2019-05-19 NOTE — Progress Notes (Signed)
Fulphila held per Pharmacy.

## 2019-05-19 NOTE — Progress Notes (Signed)
Virtual Visit via Video Note  I connected with Christy Abbott on 05/19/19 at 12:20 PM EST by a video enabled telemedicine application and verified that I am speaking with the correct person using two identifiers.  Location: Patient: home Provider: home   I discussed the limitations of evaluation and management by telemedicine and the availability of in person appointments. The patient expressed understanding and agreed to proceed.  History of Present Illness:  Patient is a 64 yr old female who presents today to discuss hyperglycemia.  Since her last visit she was treated in the ED on 05/17/19 for pyelonephritis.  She was prescribed Keflex 500mg  and OxyIR for pain.   She has unfortunately been diagnosed with small cell lung cancer and is being followed by Dr. Maylon Peppers (oncology) and Dr. Sondra Come (Radiation Oncology).  She reports that her sugar this AM is 214, has been in the 300's in the last few days. She has chemo 3x a week, then off for 3 weeks. She will do 4 rounds of chemo.  She is being given steroids along with her chemo which have been driving up her sugars. She is also taking radiation daily. She states that she increased her lantus to 12 units and added a 1/2 glimepiride at the instruction of the ER physician.  Her most recent day of chemo was on Friday 3/5.      Observations/Objective:   Gen: Awake, alert, no acute distress Resp: Breathing is even and non-labored Psych: calm/pleasant demeanor Neuro: Alert and Oriented x 3, + facial symmetry, speech is clear.   Assessment and Plan:  Hypokalemia- noted on most recent lab work from the ED.  Will arrange a follow up bmet to recheck.   Pyleonephritis- continue keflex. Await culture results (still pending).  Lung cancer (small cell)- undergoing chemo/radiation- defer management to oncology team.  Hyperglycemia- secondary to intermittent steroids in the setting of chemotherapy.  I have advised the patient as follows:  Please  continue lantus 10 units once daily.  Continue amaryl at 4 units once daily.  Novolog sliding scale- check sugar and inject 3 times daily before meals as below:  <200-   Zero units 201-250 4 units 251-300 6 units 301-350 8 units 351-400 10 units >400             12 units and contact us.  Follow Up Instructions:    I discussed the assessment and treatment plan with the patient. The patient was provided an opportunity to ask questions and all were answered. The patient agreed with the plan and demonstrated an understanding of the instructions.   The patient was advised to call back or seek an in-person evaluation if the symptoms worsen or if the condition fails to improve as anticipated.  Nance Pear, NP

## 2019-05-19 NOTE — Telephone Encounter (Signed)
I see that when she was in the ER her potassium was low.  Please arrange a follow up lab appointment some time this week so we can recheck her level.   Also, I forgot to ask her how her UTI symptoms are feeling since she started the antibiotics from the ER?

## 2019-05-19 NOTE — Progress Notes (Signed)
Per discussion with Dr Julien Nordmann, will not give fulphila today.  Will monitor counts with future cycles and add as needed.

## 2019-05-20 ENCOUNTER — Telehealth: Payer: Self-pay | Admitting: *Deleted

## 2019-05-20 ENCOUNTER — Ambulatory Visit
Admission: RE | Admit: 2019-05-20 | Discharge: 2019-05-20 | Disposition: A | Discharge status: Home / Self Care | Payer: 59 | Source: Ambulatory Visit | Attending: Radiation Oncology | Admitting: Radiation Oncology

## 2019-05-20 ENCOUNTER — Ambulatory Visit: Payer: 59

## 2019-05-20 ENCOUNTER — Other Ambulatory Visit: Payer: Self-pay

## 2019-05-20 ENCOUNTER — Other Ambulatory Visit: Payer: Self-pay | Admitting: Radiation Oncology

## 2019-05-20 DIAGNOSIS — Z510 Encounter for antineoplastic radiation therapy: Secondary | ICD-10-CM | POA: Diagnosis not present

## 2019-05-20 MED ORDER — ONDANSETRON HCL 4 MG PO TABS: 4.0000 mg | ORAL_TABLET | Freq: Three times a day (TID) | ORAL | 0 refills | Status: DC | PRN

## 2019-05-20 NOTE — Telephone Encounter (Signed)
Called pt to see how she did with her chemo last week.  She denies problems/concerns except nausea when she gets up in AM.  Dr Sondra Come gave her a script for zofran & she has not tried yet but encouraged to take in am before she gets up & see if this helps. She reports that she knows how to get in touch with [place] & will call for concerns/questions.

## 2019-05-20 NOTE — Telephone Encounter (Signed)
-----   Message from Priscille Loveless, RN sent at 05/14/2019  9:19 AM EST ----- Regarding: First Chemo Mohamed First etopisde and cisplatin follow up

## 2019-05-20 NOTE — Telephone Encounter (Signed)
Talked to patient yesterday and scheduled her for bmet tomorrow.  She reported her UTI symptoms are much better.

## 2019-05-21 ENCOUNTER — Encounter: Payer: Self-pay | Admitting: Medical Oncology

## 2019-05-21 ENCOUNTER — Ambulatory Visit: Payer: 59

## 2019-05-21 ENCOUNTER — Other Ambulatory Visit: Payer: 59

## 2019-05-21 ENCOUNTER — Other Ambulatory Visit: Payer: Self-pay

## 2019-05-21 ENCOUNTER — Ambulatory Visit
Admission: RE | Admit: 2019-05-21 | Discharge: 2019-05-21 | Disposition: A | Discharge status: Home / Self Care | Payer: 59 | Source: Ambulatory Visit | Attending: Radiation Oncology | Admitting: Radiation Oncology

## 2019-05-21 DIAGNOSIS — Z510 Encounter for antineoplastic radiation therapy: Secondary | ICD-10-CM | POA: Diagnosis not present

## 2019-05-21 LAB — URINE CULTURE: Culture: >=100000 — AB

## 2019-05-22 ENCOUNTER — Ambulatory Visit: Payer: 59

## 2019-05-22 ENCOUNTER — Telehealth: Payer: Self-pay | Admitting: Medical Oncology

## 2019-05-22 ENCOUNTER — Encounter: Payer: Self-pay | Admitting: Internal Medicine

## 2019-05-22 ENCOUNTER — Inpatient Hospital Stay (HOSPITAL_BASED_OUTPATIENT_CLINIC_OR_DEPARTMENT_OTHER): Payer: 59 | Admitting: Internal Medicine

## 2019-05-22 ENCOUNTER — Other Ambulatory Visit: Payer: Self-pay | Admitting: Medical Oncology

## 2019-05-22 ENCOUNTER — Telehealth: Payer: Self-pay

## 2019-05-22 ENCOUNTER — Ambulatory Visit
Admission: RE | Admit: 2019-05-22 | Discharge: 2019-05-22 | Disposition: A | Discharge status: Home / Self Care | Payer: 59 | Source: Ambulatory Visit | Attending: Radiation Oncology | Admitting: Radiation Oncology

## 2019-05-22 ENCOUNTER — Telehealth: Payer: Self-pay | Admitting: Internal Medicine

## 2019-05-22 ENCOUNTER — Other Ambulatory Visit: Payer: Self-pay

## 2019-05-22 ENCOUNTER — Inpatient Hospital Stay: Payer: 59

## 2019-05-22 VITALS — BP 117/78 | HR 101 | Temp 98.7°F | Resp 17 | Ht 64.0 in | Wt 143.2 lb

## 2019-05-22 DIAGNOSIS — C3490 Malignant neoplasm of unspecified part of unspecified bronchus or lung: Secondary | ICD-10-CM

## 2019-05-22 DIAGNOSIS — C3492 Malignant neoplasm of unspecified part of left bronchus or lung: Secondary | ICD-10-CM

## 2019-05-22 DIAGNOSIS — I10 Essential (primary) hypertension: Secondary | ICD-10-CM | POA: Diagnosis not present

## 2019-05-22 DIAGNOSIS — Z5111 Encounter for antineoplastic chemotherapy: Secondary | ICD-10-CM | POA: Diagnosis not present

## 2019-05-22 DIAGNOSIS — Z510 Encounter for antineoplastic radiation therapy: Secondary | ICD-10-CM | POA: Diagnosis not present

## 2019-05-22 LAB — CMP (CANCER CENTER ONLY)
ALT: 10 U/L (ref 0–44)
AST: 8 U/L — ABNORMAL LOW (ref 15–41)
Albumin: 3.2 g/dL — ABNORMAL LOW (ref 3.5–5.0)
Alkaline Phosphatase: 109 U/L (ref 38–126)
Anion gap: 11 (ref 5–15)
BUN: 27 mg/dL — ABNORMAL HIGH (ref 8–23)
CO2: 31 mmol/L (ref 22–32)
Calcium: 9.4 mg/dL (ref 8.9–10.3)
Chloride: 95 mmol/L — ABNORMAL LOW (ref 98–111)
Creatinine: 0.81 mg/dL (ref 0.44–1.00)
GFR, Est AFR Am: >60 mL/min (ref >60)
GFR, Estimated: >60 mL/min (ref >60)
Glucose, Bld: 270 mg/dL — ABNORMAL HIGH (ref 70–99)
Potassium: 3.1 mmol/L — ABNORMAL LOW (ref 3.5–5.1)
Sodium: 137 mmol/L (ref 135–145)
Total Bilirubin: 0.9 mg/dL (ref 0.3–1.2)
Total Protein: 6.6 g/dL (ref 6.5–8.1)

## 2019-05-22 LAB — CBC WITH DIFFERENTIAL (CANCER CENTER ONLY)
Abs Immature Granulocytes: 0.05 10*3/uL (ref 0.00–0.07)
Basophils Absolute: 0 10*3/uL (ref 0.0–0.1)
Basophils Relative: 1 %
Eosinophils Absolute: 0.1 10*3/uL (ref 0.0–0.5)
Eosinophils Relative: 2 %
HCT: 40.6 % (ref 36.0–46.0)
Hemoglobin: 13.8 g/dL (ref 12.0–15.0)
Immature Granulocytes: 2 %
Lymphocytes Relative: 35 %
Lymphs Abs: 1.1 10*3/uL (ref 0.7–4.0)
MCH: 30.2 pg (ref 26.0–34.0)
MCHC: 34 g/dL (ref 30.0–36.0)
MCV: 88.8 fL (ref 80.0–100.0)
Monocytes Absolute: 0 10*3/uL — ABNORMAL LOW (ref 0.1–1.0)
Monocytes Relative: 1 %
Neutro Abs: 2 10*3/uL (ref 1.7–7.7)
Neutrophils Relative %: 59 %
Platelet Count: 93 10*3/uL — ABNORMAL LOW (ref 150–400)
RBC: 4.57 MIL/uL (ref 3.87–5.11)
RDW: 12.9 % (ref 11.5–15.5)
WBC Count: 3.3 10*3/uL — ABNORMAL LOW (ref 4.0–10.5)
nRBC: 0 % (ref 0.0–0.2)

## 2019-05-22 LAB — MAGNESIUM: Magnesium: 1.3 mg/dL — CL (ref 1.7–2.4)

## 2019-05-22 MED ORDER — MAGNESIUM OXIDE 400 (241.3 MG) MG PO TABS: 400.0000 mg | ORAL_TABLET | Freq: Three times a day (TID) | ORAL | 0 refills | Status: DC

## 2019-05-22 NOTE — Telephone Encounter (Signed)
Post ED Visit - Positive Culture Follow-up  Culture report reviewed by antimicrobial stewardship pharmacist: [place] Team []  Elenor Quinones, Pharm.D. []  Heide Guile, Pharm.D., BCPS AQ-ID []  Parks Neptune, Pharm.D., BCPS []  Alycia Rossetti, Pharm.D., BCPS []  [place], [place].D., BCPS, AAHIVP []  Legrand Como, Pharm.D., BCPS, AAHIVP []  Salome Arnt, PharmD, BCPS []  Johnnette Gourd, PharmD, BCPS []  Hughes Better, PharmD, BCPS []  Leeroy Cha, PharmD []  Laqueta Linden, PharmD, BCPS []  Albertina Parr, PharmD  [place] Team []  Leodis Sias, PharmD []  Lindell Spar, PharmD []  Royetta Asal, PharmD []  Graylin Shiver, Rph []  Rema Fendt) Glennon Mac, PharmD []  Arlyn Dunning, PharmD []  Netta Cedars, PharmD []  Dia Sitter, PharmD []  Leone Haven, PharmD []  Gretta Arab, PharmD []  Theodis Shove, PharmD []  Peggyann Juba, PharmD []  Reuel Boom, PharmD Dolly Rias Pharm D  Positive urine culture Treated with Cephalexin, organism sensitive to the same and no further patient follow-up is required at this time.  Genia Del 05/22/2019, 11:44 AM

## 2019-05-22 NOTE — Telephone Encounter (Signed)
Scheduled per los. Patient declined printout  

## 2019-05-22 NOTE — Progress Notes (Signed)
[place] [phone]   [phone]  OFFICE PROGRESS NOTE  [name], NP [place] [address]  DIAGNOSIS: limited stage (T2a, N1, M0) small cell lung cancer presented with right hilar mass in addition to right hilar adenopathy diagnosed in February 2021.  PRIOR THERAPY: None.  CURRENT THERAPY: Systemic chemotherapy with cisplatin 80 mg/M2 on day 1 and etoposide 100 mg/M2 on days 1, 2 and 3 every 3 weeks.  Status post 1 cycle.  This is concurrent with radiotherapy.  INTERVAL HISTORY: Christy Abbott 64 y.o. female returns to the clinic today for follow-up visit.  Her daughter was available by phone during the visit. The patient is feeling fine today with no concerning complaints except for mild fatigue for a few days after the first cycle of her chemotherapy.  She also had few episodes of nausea improved with Zofran.  She has intermittent pain in the right shoulder and lower abdomen but that again resolved within its own.  She denied having any current fever or chills.  She has no chest pain, shortness of breath, cough or hemoptysis.  She denied having any fever or chills.  She has no weight loss or night sweats.  She has no current nausea, vomiting, diarrhea or constipation.  She is here today for evaluation 1 week after the start of her treatment.  MEDICAL HISTORY: Past Medical History:  Diagnosis Date  . Arthritis   . Atrial flutter [facility])    s/p CTI by Dr Lovena Le 02/2013  . Chest pain   . Diabetes mellitus without complication ([place])   . Dysrhythmia    hx of Aflutter  . 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 Hemochromatosis associated with compound heterozygous mutation in HFE gene ([place]) 12/10/2017  . Hypertension   . Hyperthyroidism 10/31/2014    ALLERGIES:  is allergic to jardiance [empagliflozin] and trazodone and nefazodone.  MEDICATIONS:  Current Outpatient Medications    Medication Sig Dispense Refill  . albuterol (VENTOLIN HFA) 108 (90 Base) MCG/ACT inhaler Inhale 2 puffs into the lungs every 6 (six) hours as needed for wheezing or shortness of breath. 6.7 g 0  . Bioflavonoid Products (ESTER C PO) Take 1 tablet by mouth daily.    . Calcium Carb-Cholecalciferol (CALTRATE 600+D3 PO) Take 1 tablet by mouth daily.    . carvedilol (COREG) 12.5 MG tablet Take 1 tablet (12.5 mg total) by mouth in the morning and at bedtime. 180 tablet 3  . cephALEXin (KEFLEX) 500 MG capsule Take 1 capsule (500 mg total) by mouth 4 (four) times daily. 28 capsule 0  . diphenhydramine-acetaminophen (TYLENOL PM) 25-500 MG TABS tablet Take 2 tablets by mouth at bedtime.    . diphenhydramine-acetaminophen (TYLENOL PM) 25-500 MG TABS tablet Take 1 tablet by mouth at bedtime.    . flecainide (TAMBOCOR) 50 MG tablet TAKE 1 TABLET BY MOUTH TWICE DAILY, MAY TAKE AN ADDITIONAL 1 TABLET AS NEEDED. Please keep upcoming appt in January. Thank you (Patient taking differently: Take 50 mg by mouth as directed. Take 50 mg by mouth twice daily, may take a third 50 mg dose as needed for palpitations) 270 tablet 0  . furosemide (LASIX) 20 MG tablet Take 1 tablet (20 mg total) by mouth daily as needed. (Patient taking differently: Take 20 mg by mouth daily as needed. fluid) 60 tablet 0  . glimepiride (AMARYL) 4 MG tablet Take 1 tablet (4 mg  total) by mouth daily with breakfast. 90 tablet 1  . guaiFENesin-dextromethorphan (ROBITUSSIN DM) 100-10 MG/5ML syrup Take 5 mLs by mouth every 4 (four) hours as needed for cough. 118 mL 0  . insulin aspart (NOVOLOG) 100 UNIT/ML FlexPen Inject subcutaneously 3 times daily prior to meals per sliding scale 15 mL 1  . insulin glargine (LANTUS) 100 unit/mL SOPN Inject 0.1 mLs (10 Units total) into the skin daily. (Patient taking differently: Inject 10 Units into the skin at bedtime. ) 15 mL 2  . Insulin Pen Needle (B-D UF III MINI PEN NEEDLES) 31G X 5 MM MISC Use with insulin pen  100 each 3  . Lancets 30G MISC Check sugar twice daily. 100 each 3  . lidocaine-prilocaine (EMLA) cream Apply 1 application topically as needed. Using a cotton ball , apply a small amount of cream to the skin over the porta cath site. Do not rub in the cream. Cover with Plastic wrap. 30 g 0  . lisinopril-hydrochlorothiazide (PRINZIDE,ZESTORETIC) 20-25 MG tablet Take 1 tablet by mouth daily. 90 tablet 1  . metFORMIN (GLUCOPHAGE) 500 MG tablet Take 2 tablets (1,000 mg total) by mouth 2 (two) times daily. 120 tablet 1  . methimazole (TAPAZOLE) 10 MG tablet TAKE 1 & 1/2 (ONE & ONE-HALF) TABLETS BY MOUTH ONCE DAILY (Patient taking differently: Take 15 mg by mouth daily. ) 45 tablet 0  . Omega-3 Fatty Acids (FISH OIL) 1200 MG CAPS Take 1,200 mg by mouth daily.     . ondansetron (ZOFRAN) 4 MG tablet Take 1 tablet (4 mg total) by mouth every 8 (eight) hours as needed for nausea or vomiting. 20 tablet 0  . oxycodone (OXY-IR) 5 MG capsule Take 1 capsule (5 mg total) by mouth every 4 (four) hours as needed. 12 capsule 0  . potassium chloride SA (KLOR-CON) 20 MEQ tablet TAKE 1  BY MOUTH ONCE DAILY (Patient taking differently: Take 20 mEq by mouth daily. ) 90 tablet 0  . prochlorperazine (COMPAZINE) 10 MG tablet Take 1 tablet (10 mg total) by mouth every 6 (six) hours as needed for nausea or vomiting. 30 tablet 0  . simvastatin (ZOCOR) 10 MG tablet TAKE 1 TABLET BY MOUTH AT BEDTIME (Patient taking differently: Take 10 mg by mouth at bedtime. ) 90 tablet 0  . Tetrahydrozoline HCl (VISINE OP) Place 1 drop into both eyes daily as needed (irritation).     No current facility-administered medications for this visit.    SURGICAL HISTORY:  Past Surgical History:  Procedure Laterality Date  . ABLATION  03-04-2013   CTI by Dr Lovena Le for atrial flutter  . ATRIAL FLUTTER ABLATION N/A 03/04/2013   Procedure: ATRIAL FLUTTER ABLATION;  Surgeon: Evans Lance, MD;  [facility] C[facility] LAB;  Service: Cardiovascular;   Laterality: N/A;  . CHOLECYSTECTOMY  1982  . COLONOSCOPY W/ POLYPECTOMY     x 2  . ESOPHAGOGASTRODUODENOSCOPY N/A 05/20/2014   Procedure: ESOPHAGOGASTRODUODENOSCOPY (EGD);  Surgeon: Teena Irani, MD;  Location: [facility] ENDOSCOPY;  Service: Endoscopy;  Laterality: N/A;  . IR IMAGING GUIDED PORT INSERTION  05/09/2019  . TONSILLECTOMY  1972 ?  Marland Kitchen VIDEO BRONCHOSCOPY WITH ENDOBRONCHIAL ULTRASOUND Left 04/30/2019   Procedure: VIDEO BRONCHOSCOPY WITH ENDOBRONCHIAL ULTRASOUND;  Surgeon: Garner Nash, DO;  Location: [facility];  Service: Pulmonary;  Laterality: Left;    REVIEW OF SYSTEMS:  A comprehensive review of systems was negative except for: Constitutional: positive for fatigue Gastrointestinal: positive for nausea   PHYSICAL EXAMINATION: General appearance: alert, cooperative, fatigued  and no distress Head: Normocephalic, without obvious abnormality, atraumatic Neck: no adenopathy, no JVD, supple, symmetrical, trachea midline and thyroid not enlarged, symmetric, no tenderness/mass/nodules Lymph nodes: Cervical, supraclavicular, and axillary nodes normal. Resp: clear to auscultation bilaterally Back: symmetric, no curvature. ROM normal. No CVA tenderness. Cardio: regular rate and rhythm, S1, S2 normal, no murmur, click, rub or gallop GI: soft, non-tender; bowel sounds normal; no masses,  no organomegaly Extremities: extremities normal, atraumatic, no cyanosis or edema  ECOG PERFORMANCE STATUS: 1 - Symptomatic but completely ambulatory  Blood pressure 117/78, pulse (!) 101, temperature 98.7 F (37.1 C), temperature source Temporal, resp. rate 17, height 5\' 4"  (1.626 m), weight 143 lb 3.2 oz (65 kg), SpO2 97 %.  LABORATORY DATA: Lab Results  Component Value Date   WBC 16.8 (H) 05/17/2019   HGB 15.2 (H) 05/17/2019   HCT 44.5 05/17/2019   MCV 89.5 05/17/2019   PLT 247 05/17/2019      Chemistry      Component Value Date/Time   NA 128 (L) 05/17/2019 1537   NA 140 02/21/2018 0955   K 3.1 (L)  05/17/2019 1537   CL 91 (L) 05/17/2019 1537   CO2 25 05/17/2019 1537   BUN 34 (H) 05/17/2019 1537   BUN 14 02/21/2018 0955   CREATININE 0.76 05/17/2019 1537   CREATININE 0.72 05/14/2019 0807   CREATININE 0.82 01/22/2015 1556      Component Value Date/Time   CALCIUM 9.0 05/17/2019 1537   ALKPHOS 94 05/14/2019 0807   AST 11 (L) 05/14/2019 0807   ALT 11 05/14/2019 0807   BILITOT 0.6 05/14/2019 0807       RADIOGRAPHIC STUDIES: MR BRAIN W WO CONTRAST  Result Date: 05/12/2019 CLINICAL DATA:  Non-small lung cancer. Staging. EXAM: MRI HEAD WITHOUT AND WITH CONTRAST TECHNIQUE: Multiplanar, multiecho pulse sequences of the brain and surrounding structures were obtained without and with intravenous contrast. CONTRAST:  80mL GADAVIST GADOBUTROL 1 MMOL/ML IV SOLN COMPARISON:  None. FINDINGS: Brain: No acute infarction, hemorrhage, hydrocephalus, extra-axial collection or mass lesion. Scattered foci of T2 hyperintensity are seen within the white matter of the cerebral hemispheres, nonspecific, most likely related to chronic small vessel ischemia. Vascular: Normal flow voids. Skull and upper cervical spine: Normal marrow signal. Sinuses/Orbits: Small mucous retention cyst in the right sphenoid sinus. Mucosal thickening in the bilateral frontal sinuses and ethmoid cells. The orbits are preserved. Other: None. IMPRESSION: 1. No evidence of intracranial metastatic disease. 2. Mild chronic small vessel ischemia. 3. Inflammatory paranasal sinus disease. Electronically Signed   By: Pedro Earls M.D.   On: 05/12/2019 11:17   NM PET Image Initial (PI) Skull Base To Thigh  Result Date: 04/22/2019 CLINICAL DATA:  Initial treatment strategy for multiple pulmonary nodules. EXAM: NUCLEAR MEDICINE PET SKULL BASE TO THIGH TECHNIQUE: 7.1 mCi F-18 FDG was injected intravenously. Full-ring PET imaging was performed from the skull base to thigh after the radiotracer. CT data was obtained and used for  attenuation correction and anatomic localization. Fasting blood glucose: 145 mg/dl COMPARISON:  CTA chest dated 03/22/2019 FINDINGS: Mediastinal blood pool activity: SUV max 2.7 Liver activity: SUV max NA NECK: No hypermetabolic cervical lymphadenopathy. Incidental CT findings: none CHEST: 2.5 cm central left upper lobe/perihilar nodule (series 8/image 33), previously 2.1 cm, max SUV 7.5. This is suspicious for primary bronchogenic neoplasm. 12 mm nodular opacity in the posterior right upper lobe (series 8/image 20), previously 15 mm, max SUV 1.4. 7 mm subpleural nodule in the posterior right lower  lobe (series 8/image 29) and additional 10 mm bilobed subpleural nodule in the posterior right lower lobe (series 8/image 39), previously 8 and 10 mm respectively, max SUV 2.7. 7 mm posterior left lower lobe nodule (series 8/image 47), previously 12 mm. Additional scattered small bilateral pulmonary nodules. These findings are indeterminate but favor multifocal infection. Lingular atelectasis, mildly progressive. Mild patchy opacity in the medial left upper lobe (series 8/image 29), unchanged. No pleural effusion or pneumothorax. No hypermetabolic thoracic lymphadenopathy. Incidental CT findings: Atherosclerotic calcifications of the aortic arch. Mild coronary atherosclerosis of the LAD. ABDOMEN/PELVIS: No hypermetabolic abdominopelvic lymphadenopathy. No abnormal hypermetabolism in the liver, spleen, pancreas, or adrenal glands. Incidental CT findings: Status post cholecystectomy. Atherosclerotic calcifications the abdominal aorta and branch vessels. SKELETON: No focal hypermetabolic activity to suggest skeletal metastasis. Incidental CT findings: Degenerative changes of the visualized thoracolumbar spine. IMPRESSION: 2.5 cm central left upper lobe/perihilar nodule, mildly progressive, suspicious for primary bronchogenic neoplasm. Scattered small bilateral pulmonary nodules, most of which are mildly improved, favoring  mild multifocal infection. No evidence of metastatic disease in the abdomen/pelvis. Electronically Signed   By: Julian Hy M.D.   On: 04/22/2019 14:46   CT Renal Stone Study  Result Date: 05/17/2019 CLINICAL DATA:  Hematuria, unknown cause, bilateral lower abdominal and flank pain EXAM: CT ABDOMEN AND PELVIS WITHOUT CONTRAST TECHNIQUE: Multidetector CT imaging of the abdomen and pelvis was performed following the standard protocol without IV contrast. COMPARISON:  CT abdomen pelvis 03/24/2019, PET-CT 04/22/2019 FINDINGS: Lower chest: Stable bandlike area of scarring and/or atelectasis in the lingula. 6 mm nodule in the left lower lobe appears slightly decreased from 7 mm on comparison PET-CT. Normal heart size. No pericardial effusion. Hepatobiliary: No focal liver abnormality is seen. Patient is post cholecystectomy. Slight prominence of the biliary tree likely related to reservoir effect. No calcified intraductal gallstones. Pancreas: Unremarkable. No pancreatic ductal dilatation or surrounding inflammatory changes. Spleen: Normal in size without focal abnormality. Adrenals/Urinary Tract: Normal adrenal glands. No visible or obstructing urolithiasis however there is mild bilateral symmetric urinary tract dilatation without frank hydronephrosis. There is mild periureteral hazy stranding and bladder wall thickening. No visible or contour deforming renal lesions. Stomach/Bowel: Distal esophagus, stomach and duodenal sweep are unremarkable. No small bowel wall thickening or dilatation. No evidence of obstruction. A normal appendix is visualized. No colonic dilatation or wall thickening. Scattered colonic diverticula without focal pericolonic inflammation to suggest diverticulitis. Vascular/Lymphatic: Atherosclerotic plaque within the normal caliber aorta and branch vessels. No suspicious or enlarged lymph nodes in the included lymphatic chains. Reproductive: Anteverted uterus. No concerning adnexal lesions.  Other: No bowel containing hernia. Mild posterior body wall edema. No abdominopelvic free fluid or air. Musculoskeletal: Multilevel degenerative changes are present in the imaged portions of the spine. No acute osseous abnormality or suspicious osseous lesion. No suspicious lytic or blastic osseous lesions. Mild levocurvature of the spine is similar to comparison studies. IMPRESSION: 1. Mild bilateral symmetric urinary tract dilatation without frank hydronephrosis. There is mild periureteral hazy stranding and bladder wall thickening which can be seen with ascending urinary tract infection. 2. Colonic diverticulosis without CT evidence of diverticulitis. 3. Left lower lobe 6 mm pulmonary nodule appears slightly decreased in size from 7 mm on comparison PET-CT. 4. Aortic Atherosclerosis (ICD10-I70.0). Electronically Signed   By: Lovena Le M.D.   On: 05/17/2019 17:13   IR IMAGING GUIDED PORT INSERTION  Result Date: 05/09/2019 INDICATION: 64 year old female with small cell lung cancer in need of durable venous access for chemotherapy. EXAM:  IMPLANTED PORT A CATH PLACEMENT WITH ULTRASOUND AND FLUOROSCOPIC GUIDANCE MEDICATIONS: 2 g Ancef; The antibiotic was administered within an appropriate time interval prior to skin puncture. ANESTHESIA/SEDATION: Versed 2 mg IV; Fentanyl 100 mcg IV; Moderate Sedation Time:  24 minutes The patient was continuously monitored during the procedure by the interventional radiology nurse under my direct supervision. FLUOROSCOPY TIME:  0 minutes, 36 seconds (7 mGy) COMPLICATIONS: None immediate. PROCEDURE: The right neck and chest was prepped with chlorhexidine, and draped in the usual sterile fashion using maximum barrier technique (cap and mask, sterile gown, sterile gloves, large sterile sheet, hand hygiene and cutaneous antiseptic). Local anesthesia was attained by infiltration with 1% lidocaine with epinephrine. Ultrasound demonstrated patency of the right internal jugular vein,  and this was documented with an image. Under real-time ultrasound guidance, this vein was accessed with a 21 gauge micropuncture needle and image documentation was performed. A small dermatotomy was made at the access site with an 11 scalpel. A 0.018" wire was advanced into the SVC and the access needle exchanged for a 3F micropuncture vascular sheath. The 0.018" wire was then removed and a 0.035" wire advanced into the IVC. An appropriate location for the subcutaneous reservoir was selected below the clavicle and an incision was made through the skin and underlying soft tissues. The subcutaneous tissues were then dissected using a combination of blunt and sharp surgical technique and a pocket was formed. A single lumen power injectable portacatheter was then tunneled through the subcutaneous tissues from the pocket to the dermatotomy and the port reservoir placed within the subcutaneous pocket. The venous access site was then serially dilated and a peel away vascular sheath placed over the wire. The wire was removed and the port catheter advanced into position under fluoroscopic guidance. The catheter tip is positioned in the superior cavoatrial junction. This was documented with a spot image. The portacatheter was then tested and found to flush and aspirate well. The port was flushed with saline followed by 100 units/mL heparinized saline. The pocket was then closed in two layers using first subdermal inverted interrupted absorbable sutures followed by a running subcuticular suture. The epidermis was then sealed with Dermabond. The dermatotomy at the venous access site was also closed with Dermabond. IMPRESSION: Successful placement of a right IJ approach Power Port with ultrasound and fluoroscopic guidance. The catheter is ready for use. Electronically Signed   By: Jacqulynn Cadet M.D.   On: 05/09/2019 16:28    ASSESSMENT AND PLAN: This is a very pleasant 64 years old white female with limited stage small  cell lung cancer and currently undergoing systemic chemotherapy with cisplatin and etoposide concurrent with radiation status post 1 cycle given last week. The patient tolerated the first week of her treatment well except for fatigue and occasional nausea. I recommended for her to continue her current treatment with the same regimen and she is expected to start cycle #2 in 2 weeks.  The patient will have weekly lab during her treatment. She was advised to call immediately if she has any other concerning symptoms in the interval. The patient voices understanding of current disease status and treatment options and is in agreement with the current care plan.  All questions were answered. The patient knows to call the clinic with any problems, questions or concerns. We can certainly see the patient much sooner if necessary.   Disclaimer: This note was dictated with voice recognition software. Similar sounding words can inadvertently be transcribed and may not be corrected  upon review.

## 2019-05-22 NOTE — Telephone Encounter (Signed)
CRITICAL VALUE STICKER  CRITICAL VALUE:mag 1.3  RECEIVER Decarlo Rivet  DATE & TIME NOTIFIED: 03/11/  MESSENGER (representative from lab):Jay  MD NOTIFIED: [facility]  TIME OF NOTIFICATION:1216 05/22/19  RESPONSE: supplement

## 2019-05-23 ENCOUNTER — Ambulatory Visit
Admission: RE | Admit: 2019-05-23 | Discharge: 2019-05-23 | Disposition: A | Discharge status: Home / Self Care | Payer: 59 | Source: Ambulatory Visit | Attending: Radiation Oncology | Admitting: Radiation Oncology

## 2019-05-23 ENCOUNTER — Ambulatory Visit: Payer: 59

## 2019-05-23 ENCOUNTER — Other Ambulatory Visit: Payer: Self-pay

## 2019-05-23 DIAGNOSIS — Z510 Encounter for antineoplastic radiation therapy: Secondary | ICD-10-CM | POA: Diagnosis not present

## 2019-05-26 ENCOUNTER — Ambulatory Visit: Payer: 59

## 2019-05-26 ENCOUNTER — Other Ambulatory Visit: Payer: Self-pay

## 2019-05-26 ENCOUNTER — Ambulatory Visit
Admission: RE | Admit: 2019-05-26 | Discharge: 2019-05-26 | Disposition: A | Discharge status: Home / Self Care | Payer: 59 | Source: Ambulatory Visit | Attending: Radiation Oncology | Admitting: Radiation Oncology

## 2019-05-26 DIAGNOSIS — Z510 Encounter for antineoplastic radiation therapy: Secondary | ICD-10-CM | POA: Diagnosis not present

## 2019-05-27 ENCOUNTER — Telehealth: Payer: Self-pay | Admitting: *Deleted

## 2019-05-27 ENCOUNTER — Inpatient Hospital Stay: Payer: 59

## 2019-05-27 ENCOUNTER — Other Ambulatory Visit: Payer: Self-pay | Admitting: *Deleted

## 2019-05-27 ENCOUNTER — Other Ambulatory Visit: Payer: Self-pay

## 2019-05-27 ENCOUNTER — Ambulatory Visit: Payer: 59

## 2019-05-27 ENCOUNTER — Ambulatory Visit
Admission: RE | Admit: 2019-05-27 | Discharge: 2019-05-27 | Disposition: A | Discharge status: Home / Self Care | Payer: 59 | Source: Ambulatory Visit | Attending: Radiation Oncology | Admitting: Radiation Oncology

## 2019-05-27 VITALS — BP 119/74 | HR 78 | Temp 98.0°F | Resp 18

## 2019-05-27 DIAGNOSIS — D702 Other drug-induced agranulocytosis: Secondary | ICD-10-CM

## 2019-05-27 DIAGNOSIS — C3492 Malignant neoplasm of unspecified part of left bronchus or lung: Secondary | ICD-10-CM

## 2019-05-27 DIAGNOSIS — C3490 Malignant neoplasm of unspecified part of unspecified bronchus or lung: Secondary | ICD-10-CM

## 2019-05-27 DIAGNOSIS — Z510 Encounter for antineoplastic radiation therapy: Secondary | ICD-10-CM | POA: Diagnosis not present

## 2019-05-27 LAB — CBC WITH DIFFERENTIAL (CANCER CENTER ONLY)
Abs Immature Granulocytes: 0 10*3/uL (ref 0.00–0.07)
Basophils Absolute: 0 10*3/uL (ref 0.0–0.1)
Basophils Relative: 1 %
Eosinophils Absolute: 0 10*3/uL (ref 0.0–0.5)
Eosinophils Relative: 2 %
HCT: 37.6 % (ref 36.0–46.0)
Hemoglobin: 12.7 g/dL (ref 12.0–15.0)
Immature Granulocytes: 0 %
Lymphocytes Relative: 85 %
Lymphs Abs: 1.5 10*3/uL (ref 0.7–4.0)
MCH: 30.1 pg (ref 26.0–34.0)
MCHC: 33.8 g/dL (ref 30.0–36.0)
MCV: 89.1 fL (ref 80.0–100.0)
Monocytes Absolute: 0.2 10*3/uL (ref 0.1–1.0)
Monocytes Relative: 9 %
Neutro Abs: 0.1 10*3/uL — CL (ref 1.7–7.7)
Neutrophils Relative %: 3 %
Platelet Count: 58 10*3/uL — ABNORMAL LOW (ref 150–400)
RBC: 4.22 MIL/uL (ref 3.87–5.11)
RDW: 12.5 % (ref 11.5–15.5)
WBC Count: 1.8 10*3/uL — ABNORMAL LOW (ref 4.0–10.5)
nRBC: 0 % (ref 0.0–0.2)

## 2019-05-27 LAB — CMP (CANCER CENTER ONLY)
ALT: 8 U/L (ref 0–44)
AST: 9 U/L — ABNORMAL LOW (ref 15–41)
Albumin: 2.9 g/dL — ABNORMAL LOW (ref 3.5–5.0)
Alkaline Phosphatase: 98 U/L (ref 38–126)
Anion gap: 10 (ref 5–15)
BUN: 18 mg/dL (ref 8–23)
CO2: 31 mmol/L (ref 22–32)
Calcium: 8.9 mg/dL (ref 8.9–10.3)
Chloride: 100 mmol/L (ref 98–111)
Creatinine: 0.77 mg/dL (ref 0.44–1.00)
GFR, Est AFR Am: >60 mL/min (ref >60)
GFR, Estimated: >60 mL/min (ref >60)
Glucose, Bld: 203 mg/dL — ABNORMAL HIGH (ref 70–99)
Potassium: 3.7 mmol/L (ref 3.5–5.1)
Sodium: 141 mmol/L (ref 135–145)
Total Bilirubin: 0.2 mg/dL — ABNORMAL LOW (ref 0.3–1.2)
Total Protein: 6.4 g/dL — ABNORMAL LOW (ref 6.5–8.1)

## 2019-05-27 LAB — MAGNESIUM: Magnesium: 1.5 mg/dL — ABNORMAL LOW (ref 1.7–2.4)

## 2019-05-27 MED ORDER — TBO-FILGRASTIM 300 MCG/0.5ML ~~LOC~~ SOSY
300.0000 ug | PREFILLED_SYRINGE | Freq: Once | SUBCUTANEOUS | Status: DC
  Filled 2019-05-27: qty 0.5

## 2019-05-27 MED ORDER — TBO-FILGRASTIM 300 MCG/0.5ML ~~LOC~~ SOSY
300.0000 ug | PREFILLED_SYRINGE | Freq: Once | SUBCUTANEOUS | Status: AC
  Administered 2019-05-27: 300 ug via SUBCUTANEOUS
  Filled 2019-05-27: qty 0.5

## 2019-05-27 NOTE — Patient Instructions (Signed)
Tbo-Filgrastim injection What is this medicine? TBO-FILGRASTIM (T B O fil GRA stim) is a granulocyte colony-stimulating factor that helps you make more neutrophils, a type of white blood cell. Neutrophils are important for fighting infections. Some chemotherapy affects your bone marrow and lowers your neutrophils. This medicine helps decrease the length of time that neutrophils are very low (severe neutropenia). This medicine may be used for other purposes; ask your health care provider or pharmacist if you have questions. COMMON BRAND NAME(S): Granix What should I tell my health care provider before I take this medicine? They need to know if you have any of these conditions:  bone scan or tests planned  kidney disease  sickle cell anemia  an unusual or allergic reaction to tbo-filgrastim, filgrastim, pegfilgrastim, other medicines, foods, dyes, or preservatives  pregnant or trying to get pregnant  breast-feeding How should I use this medicine? This medicine is for injection under the skin. If you get this medicine at home, you will be taught how to prepare and give this medicine. Refer to the Instructions for Use that come with your medication packaging. Use exactly as directed. Take your medicine at regular intervals. Do not take your medicine more often than directed. It is important that you put your used needles and syringes in a special sharps container. Do not put them in a trash can. If you do not have a sharps container, call your pharmacist or healthcare provider to get one. Talk to your pediatrician regarding the use of this medicine in children. While this drug may be prescribed for children as young as 47 month of age for selected conditions, precautions do apply. Overdosage: If you think you have taken too much of this medicine contact a poison control center or emergency room at once. NOTE: This medicine is only for you. Do not share this medicine with others. What if I miss a  dose? It is important not to miss your dose. Call your doctor or health care professional if you miss a dose. What may interact with this medicine? This medicine may interact with the following medications:  medicines that may cause a release of neutrophils, such as lithium This list may not describe all possible interactions. Give your health care provider a list of all the medicines, herbs, non-prescription drugs, or dietary supplements you use. Also tell them if you smoke, drink alcohol, or use illegal drugs. Some items may interact with your medicine. What should I watch for while using this medicine? You may need blood work done while you are taking this medicine. What side effects may I notice from receiving this medicine? Side effects that you should report to your doctor or health care professional as soon as possible:  allergic reactions like skin rash, itching or hives, swelling of the face, lips, or tongue  back pain  blood in the urine  dark urine  dizziness  fast heartbeat  feeling faint  shortness of breath or breathing problems  signs and symptoms of infection like fever or chills; cough; or sore throat  signs and symptoms of kidney injury like trouble passing urine or change in the amount of urine  stomach or side pain, or pain at the shoulder  sweating  swelling of the legs, ankles, or abdomen  tiredness Side effects that usually do not require medical attention (report to your doctor or health care professional if they continue or are bothersome):  bone pain  diarrhea  headache  muscle pain  vomiting This list may  not describe all possible side effects. Call your doctor for medical advice about side effects. You may report side effects to FDA at 1-800-FDA-1088. Where should I keep my medicine? Keep out of the reach of children. Store in a refrigerator between 2 and 8 degrees C (36 and 46 degrees F). Keep in carton to protect from light. Throw  away this medicine if it is left out of the refrigerator for more than 5 consecutive days. Throw away any unused medicine after the expiration date. NOTE: This sheet is a summary. It may not cover all possible information. If you have questions about this medicine, talk to your doctor, pharmacist, or health care provider.  2020 Elsevier/Gold Standard (2017-12-29 19:58:39)

## 2019-05-27 NOTE — Telephone Encounter (Signed)
[facility] 0.1 Dr Julien Nordmann notified- wants Granix 300 mcg given daily X 3. Pt notified and will be here at 2:00pm today for first injection

## 2019-05-28 ENCOUNTER — Inpatient Hospital Stay: Payer: 59

## 2019-05-28 ENCOUNTER — Ambulatory Visit
Admission: RE | Admit: 2019-05-28 | Discharge: 2019-05-28 | Disposition: A | Discharge status: Home / Self Care | Payer: 59 | Source: Ambulatory Visit | Attending: Radiation Oncology | Admitting: Radiation Oncology

## 2019-05-28 ENCOUNTER — Other Ambulatory Visit: Payer: Self-pay | Admitting: Internal Medicine

## 2019-05-28 ENCOUNTER — Ambulatory Visit: Payer: 59

## 2019-05-28 ENCOUNTER — Other Ambulatory Visit: Payer: Self-pay

## 2019-05-28 DIAGNOSIS — D702 Other drug-induced agranulocytosis: Secondary | ICD-10-CM

## 2019-05-28 DIAGNOSIS — Z510 Encounter for antineoplastic radiation therapy: Secondary | ICD-10-CM | POA: Diagnosis not present

## 2019-05-28 DIAGNOSIS — D701 Agranulocytosis secondary to cancer chemotherapy: Secondary | ICD-10-CM | POA: Insufficient documentation

## 2019-05-28 DIAGNOSIS — T451X5A Adverse effect of antineoplastic and immunosuppressive drugs, initial encounter: Secondary | ICD-10-CM

## 2019-05-28 MED ORDER — TBO-FILGRASTIM 300 MCG/0.5ML ~~LOC~~ SOSY
300.0000 ug | PREFILLED_SYRINGE | Freq: Once | SUBCUTANEOUS | Status: AC
  Administered 2019-05-28: 300 ug via SUBCUTANEOUS
  Filled 2019-05-28: qty 0.5

## 2019-05-28 NOTE — Patient Instructions (Signed)
Tbo-Filgrastim injection What is this medicine? TBO-FILGRASTIM (T B O fil GRA stim) is a granulocyte colony-stimulating factor that helps you make more neutrophils, a type of white blood cell. Neutrophils are important for fighting infections. Some chemotherapy affects your bone marrow and lowers your neutrophils. This medicine helps decrease the length of time that neutrophils are very low (severe neutropenia). This medicine may be used for other purposes; ask your health care provider or pharmacist if you have questions. COMMON BRAND NAME(S): Granix What should I tell my health care provider before I take this medicine? They need to know if you have any of these conditions:  bone scan or tests planned  kidney disease  sickle cell anemia  an unusual or allergic reaction to tbo-filgrastim, filgrastim, pegfilgrastim, other medicines, foods, dyes, or preservatives  pregnant or trying to get pregnant  breast-feeding How should I use this medicine? This medicine is for injection under the skin. If you get this medicine at home, you will be taught how to prepare and give this medicine. Refer to the Instructions for Use that come with your medication packaging. Use exactly as directed. Take your medicine at regular intervals. Do not take your medicine more often than directed. It is important that you put your used needles and syringes in a special sharps container. Do not put them in a trash can. If you do not have a sharps container, call your pharmacist or healthcare provider to get one. Talk to your pediatrician regarding the use of this medicine in children. While this drug may be prescribed for children as young as 38 month of age for selected conditions, precautions do apply. Overdosage: If you think you have taken too much of this medicine contact a poison control center or emergency room at once. NOTE: This medicine is only for you. Do not share this medicine with others. What if I miss a  dose? It is important not to miss your dose. Call your doctor or health care professional if you miss a dose. What may interact with this medicine? This medicine may interact with the following medications:  medicines that may cause a release of neutrophils, such as lithium This list may not describe all possible interactions. Give your health care provider a list of all the medicines, herbs, non-prescription drugs, or dietary supplements you use. Also tell them if you smoke, drink alcohol, or use illegal drugs. Some items may interact with your medicine. What should I watch for while using this medicine? You may need blood work done while you are taking this medicine. What side effects may I notice from receiving this medicine? Side effects that you should report to your doctor or health care professional as soon as possible:  allergic reactions like skin rash, itching or hives, swelling of the face, lips, or tongue  back pain  blood in the urine  dark urine  dizziness  fast heartbeat  feeling faint  shortness of breath or breathing problems  signs and symptoms of infection like fever or chills; cough; or sore throat  signs and symptoms of kidney injury like trouble passing urine or change in the amount of urine  stomach or side pain, or pain at the shoulder  sweating  swelling of the legs, ankles, or abdomen  tiredness Side effects that usually do not require medical attention (report to your doctor or health care professional if they continue or are bothersome):  bone pain  diarrhea  headache  muscle pain  vomiting This list may  not describe all possible side effects. Call your doctor for medical advice about side effects. You may report side effects to FDA at 1-800-FDA-1088. Where should I keep my medicine? Keep out of the reach of children. Store in a refrigerator between 2 and 8 degrees C (36 and 46 degrees F). Keep in carton to protect from light. Throw  away this medicine if it is left out of the refrigerator for more than 5 consecutive days. Throw away any unused medicine after the expiration date. NOTE: This sheet is a summary. It may not cover all possible information. If you have questions about this medicine, talk to your doctor, pharmacist, or health care provider.  2020 [employer] Standard (2017-12-29 19:58:39)

## 2019-05-28 NOTE — Addendum Note (Signed)
Addended by: Tora Kindred on: 05/28/2019 01:27 PM   Modules accepted: Orders

## 2019-05-29 ENCOUNTER — Other Ambulatory Visit: Payer: Self-pay

## 2019-05-29 ENCOUNTER — Inpatient Hospital Stay (HOSPITAL_BASED_OUTPATIENT_CLINIC_OR_DEPARTMENT_OTHER): Payer: 59 | Admitting: Medical

## 2019-05-29 ENCOUNTER — Ambulatory Visit
Admission: RE | Admit: 2019-05-29 | Discharge: 2019-05-29 | Disposition: A | Discharge status: Home / Self Care | Payer: 59 | Source: Ambulatory Visit | Attending: Radiation Oncology | Admitting: Radiation Oncology

## 2019-05-29 ENCOUNTER — Encounter: Payer: Self-pay | Admitting: Family

## 2019-05-29 ENCOUNTER — Ambulatory Visit: Payer: 59

## 2019-05-29 ENCOUNTER — Encounter: Payer: Self-pay | Admitting: Internal Medicine

## 2019-05-29 ENCOUNTER — Inpatient Hospital Stay: Payer: 59

## 2019-05-29 VITALS — BP 132/75 | HR 84 | Temp 98.2°F | Resp 18

## 2019-05-29 DIAGNOSIS — C3490 Malignant neoplasm of unspecified part of unspecified bronchus or lung: Secondary | ICD-10-CM

## 2019-05-29 DIAGNOSIS — D702 Other drug-induced agranulocytosis: Secondary | ICD-10-CM

## 2019-05-29 DIAGNOSIS — Z510 Encounter for antineoplastic radiation therapy: Secondary | ICD-10-CM | POA: Diagnosis not present

## 2019-05-29 MED ORDER — OXYCODONE HCL 5 MG PO CAPS: ORAL_CAPSULE | ORAL | 0 refills | Status: DC

## 2019-05-29 MED ORDER — FILGRASTIM-SNDZ 300 MCG/0.5ML IJ SOSY
PREFILLED_SYRINGE | INTRAMUSCULAR | Status: AC
  Filled 2019-05-29: qty 0.5

## 2019-05-29 MED ORDER — TBO-FILGRASTIM 300 MCG/0.5ML ~~LOC~~ SOSY
300.0000 ug | PREFILLED_SYRINGE | Freq: Once | SUBCUTANEOUS | Status: DC
  Administered 2019-05-29: 300 ug via SUBCUTANEOUS
  Filled 2019-05-29: qty 0.5

## 2019-05-29 NOTE — Progress Notes (Signed)
Pt reports intense pain since last night. Was unable to sleep due to the pain. Pain unrelieved by oxycodone. Clydia Llano RN for Dr. Julien Nordmann notified. Pt sent to symptom management to see [facility] PA.

## 2019-05-29 NOTE — Progress Notes (Signed)
Pt reports extreme headache and severe lower back pain that radiates into bilat hips/legs since starting Granix injections.  Pt here for third injection today.  Injection held temporarily while PA Lucianne Lei assesses pt in [facility].  Pt denies using claritin but reports that oxycodone prescription is not relieving her pain.

## 2019-05-29 NOTE — Patient Instructions (Signed)
Tbo-Filgrastim injection What is this medicine? TBO-FILGRASTIM (T B O fil GRA stim) is a granulocyte colony-stimulating factor that helps you make more neutrophils, a type of white blood cell. Neutrophils are important for fighting infections. Some chemotherapy affects your bone marrow and lowers your neutrophils. This medicine helps decrease the length of time that neutrophils are very low (severe neutropenia). This medicine may be used for other purposes; ask your health care provider or pharmacist if you have questions. COMMON BRAND NAME(S): Granix What should I tell my health care provider before I take this medicine? They need to know if you have any of these conditions:  bone scan or tests planned  kidney disease  sickle cell anemia  an unusual or allergic reaction to tbo-filgrastim, filgrastim, pegfilgrastim, other medicines, foods, dyes, or preservatives  pregnant or trying to get pregnant  breast-feeding How should I use this medicine? This medicine is for injection under the skin. If you get this medicine at home, you will be taught how to prepare and give this medicine. Refer to the Instructions for Use that come with your medication packaging. Use exactly as directed. Take your medicine at regular intervals. Do not take your medicine more often than directed. It is important that you put your used needles and syringes in a special sharps container. Do not put them in a trash can. If you do not have a sharps container, call your pharmacist or healthcare provider to get one. Talk to your pediatrician regarding the use of this medicine in children. While this drug may be prescribed for children as young as 80 month of age for selected conditions, precautions do apply. Overdosage: If you think you have taken too much of this medicine contact a poison control center or emergency room at once. NOTE: This medicine is only for you. Do not share this medicine with others. What if I miss a  dose? It is important not to miss your dose. Call your doctor or health care professional if you miss a dose. What may interact with this medicine? This medicine may interact with the following medications:  medicines that may cause a release of neutrophils, such as lithium This list may not describe all possible interactions. Give your health care provider a list of all the medicines, herbs, non-prescription drugs, or dietary supplements you use. Also tell them if you smoke, drink alcohol, or use illegal drugs. Some items may interact with your medicine. What should I watch for while using this medicine? You may need blood work done while you are taking this medicine. What side effects may I notice from receiving this medicine? Side effects that you should report to your doctor or health care professional as soon as possible:  allergic reactions like skin rash, itching or hives, swelling of the face, lips, or tongue  back pain  blood in the urine  dark urine  dizziness  fast heartbeat  feeling faint  shortness of breath or breathing problems  signs and symptoms of infection like fever or chills; cough; or sore throat  signs and symptoms of kidney injury like trouble passing urine or change in the amount of urine  stomach or side pain, or pain at the shoulder  sweating  swelling of the legs, ankles, or abdomen  tiredness Side effects that usually do not require medical attention (report to your doctor or health care professional if they continue or are bothersome):  bone pain  diarrhea  headache  muscle pain  vomiting This list may  not describe all possible side effects. Call your doctor for medical advice about side effects. You may report side effects to FDA at 1-800-FDA-1088. Where should I keep my medicine? Keep out of the reach of children. Store in a refrigerator between 2 and 8 degrees C (36 and 46 degrees F). Keep in carton to protect from light. Throw  away this medicine if it is left out of the refrigerator for more than 5 consecutive days. Throw away any unused medicine after the expiration date. NOTE: This sheet is a summary. It may not cover all possible information. If you have questions about this medicine, talk to your doctor, pharmacist, or health care provider.  2020 [employer]vier/Gold Standard (2017-12-29 19:58:39)

## 2019-05-30 ENCOUNTER — Other Ambulatory Visit: Payer: Self-pay

## 2019-05-30 ENCOUNTER — Ambulatory Visit: Payer: 59

## 2019-05-30 ENCOUNTER — Ambulatory Visit
Admission: RE | Admit: 2019-05-30 | Discharge: 2019-05-30 | Disposition: A | Discharge status: Home / Self Care | Payer: 59 | Source: Ambulatory Visit | Attending: Radiation Oncology | Admitting: Radiation Oncology

## 2019-05-30 ENCOUNTER — Telehealth: Payer: Self-pay | Admitting: Family

## 2019-05-30 DIAGNOSIS — Z510 Encounter for antineoplastic radiation therapy: Secondary | ICD-10-CM | POA: Diagnosis not present

## 2019-05-30 MED ORDER — CEPHALEXIN 500 MG PO CAPS: 500.0000 mg | ORAL_CAPSULE | Freq: Three times a day (TID) | ORAL | 0 refills | Status: DC

## 2019-05-30 NOTE — Telephone Encounter (Signed)
Reports end stream dysuria. Denies fever, symptoms started last night. No blood.   Rx sent for keflex. Pt is advised to go to ER if  New/worsening symptoms over weekend and call me Monday if no improvement. Pt verbalizes understanding.

## 2019-05-30 NOTE — Progress Notes (Signed)
Ms. Christy Abbott was seen today after she had received 2 days of Granix.  She was having significant spine, hip and leg pain along with a headache.  She had been told to take Claritin however she did not have any to take.  She presents to the clinic today with ongoing pain.  She drove herself to the office today thus were unable to give her any narcotics.  She did have one OxyIR at home that was left over from surgery which she took.  This case was discussed with Dr. Julien Nordmann who agrees to have a prescription for OxyIR refilled in hopes that this will alleviate some of the patient's pain.  She was told to obtain Claritin.  She is agreeable to this.  She was given a prescription for OxyIR with instructions she can take 5 to 10 mg every 4 hours as needed.  She was given 30.  She was told that she could take Tylenol also.  She expresses understanding and agreement with this plan.    Sandi Mealy, MHS, PA-C Physician Assistant

## 2019-06-02 ENCOUNTER — Telehealth: Payer: Self-pay

## 2019-06-02 ENCOUNTER — Inpatient Hospital Stay (HOSPITAL_BASED_OUTPATIENT_CLINIC_OR_DEPARTMENT_OTHER): Payer: 59 | Admitting: Internal Medicine

## 2019-06-02 ENCOUNTER — Inpatient Hospital Stay: Payer: 59

## 2019-06-02 ENCOUNTER — Other Ambulatory Visit: Payer: Self-pay

## 2019-06-02 ENCOUNTER — Ambulatory Visit: Payer: 59

## 2019-06-02 ENCOUNTER — Encounter: Payer: Self-pay | Admitting: Internal Medicine

## 2019-06-02 ENCOUNTER — Ambulatory Visit
Admission: RE | Admit: 2019-06-02 | Discharge: 2019-06-02 | Disposition: A | Discharge status: Home / Self Care | Payer: 59 | Source: Ambulatory Visit | Attending: Radiation Oncology | Admitting: Radiation Oncology

## 2019-06-02 VITALS — BP 145/84 | HR 83 | Temp 98.9°F | Resp 18 | Ht 64.0 in | Wt 147.7 lb

## 2019-06-02 DIAGNOSIS — T451X5A Adverse effect of antineoplastic and immunosuppressive drugs, initial encounter: Secondary | ICD-10-CM

## 2019-06-02 DIAGNOSIS — C3492 Malignant neoplasm of unspecified part of left bronchus or lung: Secondary | ICD-10-CM

## 2019-06-02 DIAGNOSIS — Z510 Encounter for antineoplastic radiation therapy: Secondary | ICD-10-CM | POA: Diagnosis not present

## 2019-06-02 DIAGNOSIS — C3490 Malignant neoplasm of unspecified part of unspecified bronchus or lung: Secondary | ICD-10-CM | POA: Diagnosis not present

## 2019-06-02 DIAGNOSIS — I10 Essential (primary) hypertension: Secondary | ICD-10-CM | POA: Diagnosis not present

## 2019-06-02 DIAGNOSIS — Z5111 Encounter for antineoplastic chemotherapy: Secondary | ICD-10-CM

## 2019-06-02 DIAGNOSIS — D701 Agranulocytosis secondary to cancer chemotherapy: Secondary | ICD-10-CM

## 2019-06-02 LAB — CBC WITH DIFFERENTIAL (CANCER CENTER ONLY)
Abs Immature Granulocytes: 0.55 10*3/uL — ABNORMAL HIGH (ref 0.00–0.07)
Basophils Absolute: 0.1 10*3/uL (ref 0.0–0.1)
Basophils Relative: 1 %
Eosinophils Absolute: 0 10*3/uL (ref 0.0–0.5)
Eosinophils Relative: 0 %
HCT: 36.3 % (ref 36.0–46.0)
Hemoglobin: 12.3 g/dL (ref 12.0–15.0)
Immature Granulocytes: 8 %
Lymphocytes Relative: 30 %
Lymphs Abs: 2 10*3/uL (ref 0.7–4.0)
MCH: 30.1 pg (ref 26.0–34.0)
MCHC: 33.9 g/dL (ref 30.0–36.0)
MCV: 88.8 fL (ref 80.0–100.0)
Monocytes Absolute: 0.9 10*3/uL (ref 0.1–1.0)
Monocytes Relative: 13 %
Neutro Abs: 3.2 10*3/uL (ref 1.7–7.7)
Neutrophils Relative %: 48 %
Platelet Count: 255 10*3/uL (ref 150–400)
RBC: 4.09 MIL/uL (ref 3.87–5.11)
RDW: 12.9 % (ref 11.5–15.5)
WBC Count: 6.7 10*3/uL (ref 4.0–10.5)
nRBC: 0.4 % — ABNORMAL HIGH (ref 0.0–0.2)

## 2019-06-02 LAB — CMP (CANCER CENTER ONLY)
ALT: 8 U/L (ref 0–44)
AST: 14 U/L — ABNORMAL LOW (ref 15–41)
Albumin: 3.1 g/dL — ABNORMAL LOW (ref 3.5–5.0)
Alkaline Phosphatase: 116 U/L (ref 38–126)
Anion gap: 13 (ref 5–15)
BUN: 11 mg/dL (ref 8–23)
CO2: 31 mmol/L (ref 22–32)
Calcium: 9.7 mg/dL (ref 8.9–10.3)
Chloride: 100 mmol/L (ref 98–111)
Creatinine: 0.9 mg/dL (ref 0.44–1.00)
GFR, Est AFR Am: >60 mL/min (ref >60)
GFR, Estimated: >60 mL/min (ref >60)
Glucose, Bld: 209 mg/dL — ABNORMAL HIGH (ref 70–99)
Potassium: 4.3 mmol/L (ref 3.5–5.1)
Sodium: 144 mmol/L (ref 135–145)
Total Bilirubin: 0.2 mg/dL — ABNORMAL LOW (ref 0.3–1.2)
Total Protein: 6.3 g/dL — ABNORMAL LOW (ref 6.5–8.1)

## 2019-06-02 LAB — MAGNESIUM: Magnesium: 1.4 mg/dL — CL (ref 1.7–2.4)

## 2019-06-02 NOTE — Progress Notes (Signed)
[place] [phone]   [phone]  OFFICE PROGRESS NOTE  [name], NP [place] [address]  DIAGNOSIS: limited stage (T2a, N1, M0) small cell lung cancer presented with right hilar mass in addition to right hilar adenopathy diagnosed in February 2021.  PRIOR THERAPY: None.  CURRENT THERAPY: Systemic chemotherapy with cisplatin 80 mg/M2 on day 1 and etoposide 100 mg/M2 on days 1, 2 and 3 every 3 weeks.  Status post 1 cycle.  This is concurrent with radiotherapy.  INTERVAL HISTORY: Christy Abbott 64 y.o. female returns to the clinic today for follow-up visit.  The patient is feeling fine today with no concerning complaints.  She tolerated the first cycle of her treatment with cisplatin and etoposide fairly well except for fatigue.  She started losing her hair.  She had significant neutropenia after the first cycle of her treatment and she required treatment with Granix for the neutropenia.  The patient denied having any significant chest pain, shortness of breath, cough or hemoptysis.  She denied having any fever or chills.  She has no current nausea, vomiting, diarrhea or constipation.  She is here today for evaluation before starting cycle #2.   MEDICAL HISTORY: Past Medical History:  Diagnosis Date  . Arthritis   . Atrial flutter [facility])    s/p CTI by Dr Lovena Le 02/2013  . Chest pain   . Diabetes mellitus without complication ([place])   . Dysrhythmia    hx of Aflutter  . 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 Hemochromatosis associated with compound heterozygous mutation in HFE gene ([place]) 12/10/2017  . Hypertension   . Hyperthyroidism 10/31/2014    ALLERGIES:  is allergic to jardiance [empagliflozin] and trazodone and nefazodone.  MEDICATIONS:  Current Outpatient Medications  Medication Sig Dispense Refill  . albuterol (VENTOLIN HFA) 108 (90 Base) MCG/ACT inhaler Inhale 2 puffs  into the lungs every 6 (six) hours as needed for wheezing or shortness of breath. 6.7 g 0  . Bioflavonoid Products (ESTER C PO) Take 1 tablet by mouth daily.    . Calcium Carb-Cholecalciferol (CALTRATE 600+D3 PO) Take 1 tablet by mouth daily.    . carvedilol (COREG) 12.5 MG tablet Take 1 tablet (12.5 mg total) by mouth in the morning and at bedtime. 180 tablet 3  . cephALEXin (KEFLEX) 500 MG capsule Take 1 capsule (500 mg total) by mouth 3 (three) times daily. 21 capsule 0  . diphenhydramine-acetaminophen (TYLENOL PM) 25-500 MG TABS tablet Take 2 tablets by mouth at bedtime.    . diphenhydramine-acetaminophen (TYLENOL PM) 25-500 MG TABS tablet Take 1 tablet by mouth at bedtime.    . flecainide (TAMBOCOR) 50 MG tablet TAKE 1 TABLET BY MOUTH TWICE DAILY, MAY TAKE AN ADDITIONAL 1 TABLET AS NEEDED. Please keep upcoming appt in January. Thank you (Patient taking differently: Take 50 mg by mouth as directed. Take 50 mg by mouth twice daily, may take a third 50 mg dose as needed for palpitations) 270 tablet 0  . furosemide (LASIX) 20 MG tablet Take 1 tablet (20 mg total) by mouth daily as needed. (Patient taking differently: Take 20 mg by mouth daily as needed. fluid) 60 tablet 0  . glimepiride (AMARYL) 4 MG tablet Take 1 tablet (4 mg total) by mouth daily with breakfast. 90 tablet 1  . guaiFENesin-dextromethorphan (ROBITUSSIN DM) 100-10 MG/5ML syrup Take 5 mLs by mouth every 4 (four) hours as needed  for cough. 118 mL 0  . insulin aspart (NOVOLOG) 100 UNIT/ML FlexPen Inject subcutaneously 3 times daily prior to meals per sliding scale 15 mL 1  . insulin glargine (LANTUS) 100 unit/mL SOPN Inject 0.1 mLs (10 Units total) into the skin daily. (Patient taking differently: Inject 10 Units into the skin at bedtime. ) 15 mL 2  . Insulin Pen Needle (B-D UF III MINI PEN NEEDLES) 31G X 5 MM MISC Use with insulin pen 100 each 3  . Lancets 30G MISC Check sugar twice daily. 100 each 3  . lidocaine-prilocaine (EMLA) cream  Apply 1 application topically as needed. Using a cotton ball , apply a small amount of cream to the skin over the porta cath site. Do not rub in the cream. Cover with Plastic wrap. 30 g 0  . lisinopril-hydrochlorothiazide (PRINZIDE,ZESTORETIC) 20-25 MG tablet Take 1 tablet by mouth daily. 90 tablet 1  . magnesium oxide (MAG-OX) 400 (241.3 Mg) MG tablet Take 1 tablet (400 mg total) by mouth 3 (three) times daily. 90 tablet 0  . metFORMIN (GLUCOPHAGE) 500 MG tablet Take 2 tablets (1,000 mg total) by mouth 2 (two) times daily. 120 tablet 1  . methimazole (TAPAZOLE) 10 MG tablet TAKE 1 & 1/2 (ONE & ONE-HALF) TABLETS BY MOUTH ONCE DAILY (Patient taking differently: Take 15 mg by mouth daily. ) 45 tablet 0  . Omega-3 Fatty Acids (FISH OIL) 1200 MG CAPS Take 1,200 mg by mouth daily.     . ondansetron (ZOFRAN) 4 MG tablet Take 1 tablet (4 mg total) by mouth every 8 (eight) hours as needed for nausea or vomiting. 20 tablet 0  . oxycodone (OXY-IR) 5 MG capsule 1 to 2 caps Q 4 hours prn pain 30 capsule 0  . potassium chloride SA (KLOR-CON) 20 MEQ tablet TAKE 1  BY MOUTH ONCE DAILY (Patient taking differently: Take 20 mEq by mouth daily. ) 90 tablet 0  . prochlorperazine (COMPAZINE) 10 MG tablet Take 1 tablet (10 mg total) by mouth every 6 (six) hours as needed for nausea or vomiting. 30 tablet 0  . simvastatin (ZOCOR) 10 MG tablet TAKE 1 TABLET BY MOUTH AT BEDTIME (Patient taking differently: Take 10 mg by mouth at bedtime. ) 90 tablet 0  . Tetrahydrozoline HCl (VISINE OP) Place 1 drop into both eyes daily as needed (irritation).     No current facility-administered medications for this visit.    SURGICAL HISTORY:  Past Surgical History:  Procedure Laterality Date  . ABLATION  03-04-2013   CTI by Dr Lovena Le for atrial flutter  . ATRIAL FLUTTER ABLATION N/A 03/04/2013   Procedure: ATRIAL FLUTTER ABLATION;  Surgeon: Evans Lance, MD;  Location: [facility] CATH LAB;  Service: Cardiovascular;  Laterality: N/A;  .  CHOLECYSTECTOMY  1982  . COLONOSCOPY W/ POLYPECTOMY     x 2  . ESOPHAGOGASTRODUODENOSCOPY N/A 05/20/2014   Procedure: ESOPHAGOGASTRODUODENOSCOPY (EGD);  Surgeon: Teena Irani, MD;  Location: [facility] ENDOSCOPY;  Service: Endoscopy;  Laterality: N/A;  . IR IMAGING GUIDED PORT INSERTION  05/09/2019  . TONSILLECTOMY  1972 ?  Marland Kitchen VIDEO BRONCHOSCOPY WITH ENDOBRONCHIAL ULTRASOUND Left 04/30/2019   Procedure: VIDEO BRONCHOSCOPY WITH ENDOBRONCHIAL ULTRASOUND;  Surgeon: Garner Nash, DO;  Location: [facility];  Service: Pulmonary;  Laterality: Left;    REVIEW OF SYSTEMS:  A comprehensive review of systems was negative except for: Constitutional: positive for fatigue   PHYSICAL EXAMINATION: General appearance: alert, cooperative, fatigued and no distress Head: Normocephalic, without obvious abnormality, atraumatic Neck: no adenopathy,  no JVD, supple, symmetrical, trachea midline and thyroid not enlarged, symmetric, no tenderness/mass/nodules Lymph nodes: Cervical, supraclavicular, and axillary nodes normal. Resp: clear to auscultation bilaterally Back: symmetric, no curvature. ROM normal. No CVA tenderness. Cardio: regular rate and rhythm, S1, S2 normal, no murmur, click, rub or gallop GI: soft, non-tender; bowel sounds normal; no masses,  no organomegaly Extremities: extremities normal, atraumatic, no cyanosis or edema  ECOG PERFORMANCE STATUS: 1 - Symptomatic but completely ambulatory  Blood pressure (!) 145/84, pulse 83, temperature 98.9 F (37.2 C), temperature source Oral, resp. rate 18, height 5\' 4"  (1.626 m), weight 147 lb 11.2 oz (67 kg), SpO2 97 %.  LABORATORY DATA: Lab Results  Component Value Date   WBC 6.7 06/02/2019   HGB 12.3 06/02/2019   HCT 36.3 06/02/2019   MCV 88.8 06/02/2019   PLT 255 06/02/2019      Chemistry      Component Value Date/Time   NA 141 05/27/2019 1011   NA 140 02/21/2018 0955   K 3.7 05/27/2019 1011   CL 100 05/27/2019 1011   CO2 31 05/27/2019 1011   BUN 18  05/27/2019 1011   BUN 14 02/21/2018 0955   CREATININE 0.77 05/27/2019 1011   CREATININE 0.82 01/22/2015 1556      Component Value Date/Time   CALCIUM 8.9 05/27/2019 1011   ALKPHOS 98 05/27/2019 1011   AST 9 (L) 05/27/2019 1011   ALT 8 05/27/2019 1011   BILITOT 0.2 (L) 05/27/2019 1011       RADIOGRAPHIC STUDIES: MR BRAIN W WO CONTRAST  Result Date: 05/12/2019 CLINICAL DATA:  Non-small lung cancer. Staging. EXAM: MRI HEAD WITHOUT AND WITH CONTRAST TECHNIQUE: Multiplanar, multiecho pulse sequences of the brain and surrounding structures were obtained without and with intravenous contrast. CONTRAST:  20mL GADAVIST GADOBUTROL 1 MMOL/ML IV SOLN COMPARISON:  None. FINDINGS: Brain: No acute infarction, hemorrhage, hydrocephalus, extra-axial collection or mass lesion. Scattered foci of T2 hyperintensity are seen within the white matter of the cerebral hemispheres, nonspecific, most likely related to chronic small vessel ischemia. Vascular: Normal flow voids. Skull and upper cervical spine: Normal marrow signal. Sinuses/Orbits: Small mucous retention cyst in the right sphenoid sinus. Mucosal thickening in the bilateral frontal sinuses and ethmoid cells. The orbits are preserved. Other: None. IMPRESSION: 1. No evidence of intracranial metastatic disease. 2. Mild chronic small vessel ischemia. 3. Inflammatory paranasal sinus disease. Electronically Signed   By: Pedro Earls M.D.   On: 05/12/2019 11:17   CT Renal Stone Study  Result Date: 05/17/2019 CLINICAL DATA:  Hematuria, unknown cause, bilateral lower abdominal and flank pain EXAM: CT ABDOMEN AND PELVIS WITHOUT CONTRAST TECHNIQUE: Multidetector CT imaging of the abdomen and pelvis was performed following the standard protocol without IV contrast. COMPARISON:  CT abdomen pelvis 03/24/2019, PET-CT 04/22/2019 FINDINGS: Lower chest: Stable bandlike area of scarring and/or atelectasis in the lingula. 6 mm nodule in the left lower lobe appears  slightly decreased from 7 mm on comparison PET-CT. Normal heart size. No pericardial effusion. Hepatobiliary: No focal liver abnormality is seen. Patient is post cholecystectomy. Slight prominence of the biliary tree likely related to reservoir effect. No calcified intraductal gallstones. Pancreas: Unremarkable. No pancreatic ductal dilatation or surrounding inflammatory changes. Spleen: Normal in size without focal abnormality. Adrenals/Urinary Tract: Normal adrenal glands. No visible or obstructing urolithiasis however there is mild bilateral symmetric urinary tract dilatation without frank hydronephrosis. There is mild periureteral hazy stranding and bladder wall thickening. No visible or contour deforming renal lesions. Stomach/Bowel: Distal  esophagus, stomach and duodenal sweep are unremarkable. No small bowel wall thickening or dilatation. No evidence of obstruction. A normal appendix is visualized. No colonic dilatation or wall thickening. Scattered colonic diverticula without focal pericolonic inflammation to suggest diverticulitis. Vascular/Lymphatic: Atherosclerotic plaque within the normal caliber aorta and branch vessels. No suspicious or enlarged lymph nodes in the included lymphatic chains. Reproductive: Anteverted uterus. No concerning adnexal lesions. Other: No bowel containing hernia. Mild posterior body wall edema. No abdominopelvic free fluid or air. Musculoskeletal: Multilevel degenerative changes are present in the imaged portions of the spine. No acute osseous abnormality or suspicious osseous lesion. No suspicious lytic or blastic osseous lesions. Mild levocurvature of the spine is similar to comparison studies. IMPRESSION: 1. Mild bilateral symmetric urinary tract dilatation without frank hydronephrosis. There is mild periureteral hazy stranding and bladder wall thickening which can be seen with ascending urinary tract infection. 2. Colonic diverticulosis without CT evidence of  diverticulitis. 3. Left lower lobe 6 mm pulmonary nodule appears slightly decreased in size from 7 mm on comparison PET-CT. 4. Aortic Atherosclerosis (ICD10-I70.0). Electronically Signed   By: Lovena Le M.D.   On: 05/17/2019 17:13   IR IMAGING GUIDED PORT INSERTION  Result Date: 05/09/2019 INDICATION: 64 year old female with small cell lung cancer in need of durable venous access for chemotherapy. EXAM: IMPLANTED PORT A CATH PLACEMENT WITH ULTRASOUND AND FLUOROSCOPIC GUIDANCE MEDICATIONS: 2 g Ancef; The antibiotic was administered within an appropriate time interval prior to skin puncture. ANESTHESIA/SEDATION: Versed 2 mg IV; Fentanyl 100 mcg IV; Moderate Sedation Time:  24 minutes The patient was continuously monitored during the procedure by the interventional radiology nurse under my direct supervision. FLUOROSCOPY TIME:  0 minutes, 36 seconds (7 mGy) COMPLICATIONS: None immediate. PROCEDURE: The right neck and chest was prepped with chlorhexidine, and draped in the usual sterile fashion using maximum barrier technique (cap and mask, sterile gown, sterile gloves, large sterile sheet, hand hygiene and cutaneous antiseptic). Local anesthesia was attained by infiltration with 1% lidocaine with epinephrine. Ultrasound demonstrated patency of the right internal jugular vein, and this was documented with an image. Under real-time ultrasound guidance, this vein was accessed with a 21 gauge micropuncture needle and image documentation was performed. A small dermatotomy was made at the access site with an 11 scalpel. A 0.018" wire was advanced into the SVC and the access needle exchanged for a 20F micropuncture vascular sheath. The 0.018" wire was then removed and a 0.035" wire advanced into the IVC. An appropriate location for the subcutaneous reservoir was selected below the clavicle and an incision was made through the skin and underlying soft tissues. The subcutaneous tissues were then dissected using a  combination of blunt and sharp surgical technique and a pocket was formed. A single lumen power injectable portacatheter was then tunneled through the subcutaneous tissues from the pocket to the dermatotomy and the port reservoir placed within the subcutaneous pocket. The venous access site was then serially dilated and a peel away vascular sheath placed over the wire. The wire was removed and the port catheter advanced into position under fluoroscopic guidance. The catheter tip is positioned in the superior cavoatrial junction. This was documented with a spot image. The portacatheter was then tested and found to flush and aspirate well. The port was flushed with saline followed by 100 units/mL heparinized saline. The pocket was then closed in two layers using first subdermal inverted interrupted absorbable sutures followed by a running subcuticular suture. The epidermis was then sealed with Dermabond. The  dermatotomy at the venous access site was also closed with Dermabond. IMPRESSION: Successful placement of a right IJ approach Power Port with ultrasound and fluoroscopic guidance. The catheter is ready for use. Electronically Signed   By: Jacqulynn Cadet M.D.   On: 05/09/2019 16:28    ASSESSMENT AND PLAN: This is a very pleasant 64 years old white female with limited stage small cell lung cancer and currently undergoing systemic chemotherapy with cisplatin and etoposide concurrent with radiation status post 1 cycle. The patient tolerated the first cycle of her treatment fairly well except for fatigue, alopecia and neutropenia. She is feeling much better today and I recommended for her to proceed with cycle #2 today as planned but I will add Neulasta injection after her treatment for neutropenic prophylaxis. The patient will come back for follow-up visit in 3 weeks for evaluation with repeat CT scan of the chest for restaging of her disease. She was advised to call immediately if she has any concerning  symptoms in the interval. The patient voices understanding of current disease status and treatment options and is in agreement with the current care plan.  All questions were answered. The patient knows to call the clinic with any problems, questions or concerns. We can certainly see the patient much sooner if necessary.   Disclaimer: This note was dictated with voice recognition software. Similar sounding words can inadvertently be transcribed and may not be corrected upon review.

## 2019-06-02 NOTE — Telephone Encounter (Signed)
Received a call from [facility] in the lab at 1401 reporting patient's magnesium is 1.4.  Dr. Julien Nordmann made aware at 1405. No new orders received at this time.

## 2019-06-03 ENCOUNTER — Other Ambulatory Visit: Payer: Self-pay

## 2019-06-03 ENCOUNTER — Ambulatory Visit: Payer: 59

## 2019-06-03 ENCOUNTER — Other Ambulatory Visit: Payer: Self-pay | Admitting: Family

## 2019-06-03 ENCOUNTER — Ambulatory Visit
Admission: RE | Admit: 2019-06-03 | Discharge: 2019-06-03 | Disposition: A | Discharge status: Home / Self Care | Payer: 59 | Source: Ambulatory Visit | Attending: Radiation Oncology | Admitting: Radiation Oncology

## 2019-06-03 ENCOUNTER — Other Ambulatory Visit: Payer: Self-pay | Admitting: Radiation Oncology

## 2019-06-03 ENCOUNTER — Inpatient Hospital Stay: Payer: 59

## 2019-06-03 VITALS — BP 133/86 | HR 73 | Temp 97.8°F | Resp 16

## 2019-06-03 DIAGNOSIS — C3492 Malignant neoplasm of unspecified part of left bronchus or lung: Secondary | ICD-10-CM

## 2019-06-03 DIAGNOSIS — Z510 Encounter for antineoplastic radiation therapy: Secondary | ICD-10-CM | POA: Diagnosis not present

## 2019-06-03 DIAGNOSIS — E0590 Thyrotoxicosis, unspecified without thyrotoxic crisis or storm: Secondary | ICD-10-CM

## 2019-06-03 MED ORDER — DEXAMETHASONE SODIUM PHOSPHATE 10 MG/ML IJ SOLN
10.0000 mg | Freq: Once | INTRAMUSCULAR | Status: AC
  Administered 2019-06-03: 10 mg via INTRAVENOUS

## 2019-06-03 MED ORDER — HEPARIN SOD (PORK) LOCK FLUSH 100 UNIT/ML IV SOLN
500.0000 [IU] | Freq: Once | INTRAVENOUS | Status: AC | PRN
  Administered 2019-06-03: 500 [IU]
  Filled 2019-06-03: qty 5

## 2019-06-03 MED ORDER — SODIUM CHLORIDE 0.9 % IV SOLN
100.0000 mg/m2 | Freq: Once | INTRAVENOUS | Status: AC
  Administered 2019-06-03: 180 mg via INTRAVENOUS
  Filled 2019-06-03: qty 9

## 2019-06-03 MED ORDER — SODIUM CHLORIDE 0.9% FLUSH
10.0000 mL | INTRAVENOUS | Status: DC | PRN
  Administered 2019-06-03: 10 mL
  Filled 2019-06-03: qty 10

## 2019-06-03 MED ORDER — SODIUM CHLORIDE 0.9 % IV SOLN
Freq: Once | INTRAVENOUS | Status: AC
  Filled 2019-06-03: qty 250

## 2019-06-03 MED ORDER — PALONOSETRON HCL INJECTION 0.25 MG/5ML
INTRAVENOUS | Status: AC
  Filled 2019-06-03: qty 5

## 2019-06-03 MED ORDER — SUCRALFATE 1 G PO TABS: 1.0000 g | ORAL_TABLET | Freq: Three times a day (TID) | ORAL | 1 refills | Status: DC

## 2019-06-03 MED ORDER — POTASSIUM CHLORIDE 2 MEQ/ML IV SOLN
Freq: Once | INTRAVENOUS | Status: AC
  Filled 2019-06-03: qty 10

## 2019-06-03 MED ORDER — SODIUM CHLORIDE 0.9 % IV SOLN
80.0000 mg/m2 | Freq: Once | INTRAVENOUS | Status: AC
  Administered 2019-06-03: 142 mg via INTRAVENOUS
  Filled 2019-06-03: qty 142

## 2019-06-03 MED ORDER — DEXAMETHASONE SODIUM PHOSPHATE 10 MG/ML IJ SOLN
INTRAMUSCULAR | Status: AC
  Filled 2019-06-03: qty 1

## 2019-06-03 MED ORDER — SODIUM CHLORIDE 0.9 % IV SOLN
150.0000 mg | Freq: Once | INTRAVENOUS | Status: AC
  Administered 2019-06-03: 150 mg via INTRAVENOUS
  Filled 2019-06-03: qty 5

## 2019-06-03 MED ORDER — PALONOSETRON HCL INJECTION 0.25 MG/5ML
0.2500 mg | Freq: Once | INTRAVENOUS | Status: AC
  Administered 2019-06-03: 0.25 mg via INTRAVENOUS

## 2019-06-03 NOTE — Telephone Encounter (Signed)
Please advise pt that I have refilled her tapazole, but I would like her to endocrinology for ongoing management of her thyroid. I have pended the referral.

## 2019-06-03 NOTE — Patient Instructions (Signed)
[facility] Discharge Instructions for Patients Receiving Chemotherapy  Today you received the following chemotherapy agents Etoposide; Cisplatin  To help prevent nausea and vomiting after your treatment, we encourage you to take your nausea medication as directed   If you develop nausea and vomiting that is not controlled by your nausea medication, call the clinic.   BELOW ARE SYMPTOMS THAT SHOULD BE REPORTED IMMEDIATELY:  *FEVER GREATER THAN 100.5 F  *CHILLS WITH OR WITHOUT FEVER  NAUSEA AND VOMITING THAT IS NOT CONTROLLED WITH YOUR NAUSEA MEDICATION  *UNUSUAL SHORTNESS OF BREATH  *UNUSUAL BRUISING OR BLEEDING  TENDERNESS IN MOUTH AND THROAT WITH OR WITHOUT PRESENCE OF ULCERS  *URINARY PROBLEMS  *BOWEL PROBLEMS  UNUSUAL RASH Items with * indicate a potential emergency and should be followed up as soon as possible.  Feel free to call the clinic should you have any questions or concerns. The clinic phone number is [phone].  Please show the [facility] at check-in to the Emergency Department and triage nurse.  Etoposide, VP-16 injection What is this medicine? ETOPOSIDE, VP-16 (e toe POE side) is a chemotherapy drug. It is used to treat testicular cancer, lung cancer, and other cancers. This medicine may be used for other purposes; ask your health care provider or pharmacist if you have questions. COMMON BRAND NAME(S): Etopophos, Toposar, VePesid What should I tell my health care provider before I take this medicine? They need to know if you have any of these conditions:  infection  kidney disease  liver disease  low blood counts, like low white cell, platelet, or red cell counts  an unusual or allergic reaction to etoposide, other medicines, foods, dyes, or preservatives  pregnant or trying to get pregnant  breast-feeding How should I use this medicine? This medicine is for infusion into a vein. It is administered in a hospital or  clinic by a specially trained health care professional. Talk to your pediatrician regarding the use of this medicine in children. Special care may be needed. Overdosage: If you think you have taken too much of this medicine contact a poison control center or emergency room at once. NOTE: This medicine is only for you. Do not share this medicine with others. What if I miss a dose? It is important not to miss your dose. Call your doctor or health care professional if you are unable to keep an appointment. What may interact with this medicine? This medicine may interact with the following medications:  warfarin This list may not describe all possible interactions. Give your health care provider a list of all the medicines, herbs, non-prescription drugs, or dietary supplements you use. Also tell them if you smoke, drink alcohol, or use illegal drugs. Some items may interact with your medicine. What should I watch for while using this medicine? Visit your doctor for checks on your progress. This drug may make you feel generally unwell. This is not uncommon, as chemotherapy can affect healthy cells as well as cancer cells. Report any side effects. Continue your course of treatment even though you feel ill unless your doctor tells you to stop. In some cases, you may be given additional medicines to help with side effects. Follow all directions for their use. Call your doctor or health care professional for advice if you get a fever, chills or sore throat, or other symptoms of a cold or flu. Do not treat yourself. This drug decreases your body's ability to fight infections. Try to avoid being around people  who are sick. This medicine may increase your risk to bruise or bleed. Call your doctor or health care professional if you notice any unusual bleeding. Talk to your doctor about your risk of cancer. You may be more at risk for certain types of cancers if you take this medicine. Do not become pregnant  while taking this medicine or for at least 6 months after stopping it. Women should inform their doctor if they wish to become pregnant or think they might be pregnant. Women of child-bearing potential will need to have a negative pregnancy test before starting this medicine. There is a potential for serious side effects to an unborn child. Talk to your health care professional or pharmacist for more information. Do not breast-feed an infant while taking this medicine. Men must use a latex condom during sexual contact with a woman while taking this medicine and for at least 4 months after stopping it. A latex condom is needed even if you have had a vasectomy. Contact your doctor right away if your partner becomes pregnant. Do not donate sperm while taking this medicine and for at least 4 months after you stop taking this medicine. Men should inform their doctors if they wish to father a child. This medicine may lower sperm counts. What side effects may I notice from receiving this medicine? Side effects that you should report to your doctor or health care professional as soon as possible:  allergic reactions like skin rash, itching or hives, swelling of the face, lips, or tongue  low blood counts - this medicine may decrease the number of white blood cells, red blood cells, and platelets. You may be at increased risk for infections and bleeding  nausea, vomiting  redness, blistering, peeling or loosening of the skin, including inside the mouth  signs and symptoms of infection like fever; chills; cough; sore throat; pain or trouble passing urine  signs and symptoms of low red blood cells or anemia such as unusually weak or tired; feeling faint or lightheaded; falls; breathing problems  unusual bruising or bleeding Side effects that usually do not require medical attention (report to your doctor or health care professional if they continue or are bothersome):  changes in taste  diarrhea  hair  loss  loss of appetite  mouth sores This list may not describe all possible side effects. Call your doctor for medical advice about side effects. You may report side effects to FDA at 1-800-FDA-1088. Where should I keep my medicine? This drug is given in a hospital or clinic and will not be stored at home. NOTE: This sheet is a summary. It may not cover all possible information. If you have questions about this medicine, talk to your doctor, pharmacist, or health care provider.  2020 Elsevier/Gold Standard (2018-04-24 16:57:15)  Cisplatin injection What is this medicine? CISPLATIN (SIS pla tin) is a chemotherapy drug. It targets fast dividing cells, like cancer cells, and causes these cells to die. This medicine is used to treat many types of cancer like bladder, ovarian, and testicular cancers. This medicine may be used for other purposes; ask your health care provider or pharmacist if you have questions. COMMON BRAND NAME(S): Platinol, Platinol -AQ What should I tell my health care provider before I take this medicine? They need to know if you have any of these conditions:  eye disease, vision problems  hearing problems  kidney disease  low blood counts, like white cells, platelets, or red blood cells  tingling of the fingers  or toes, or other nerve disorder  an unusual or allergic reaction to cisplatin, carboplatin, oxaliplatin, other medicines, foods, dyes, or preservatives  pregnant or trying to get pregnant  breast-feeding How should I use this medicine? This drug is given as an infusion into a vein. It is administered in a hospital or clinic by a specially trained health care professional. Talk to your pediatrician regarding the use of this medicine in children. Special care may be needed. Overdosage: If you think you have taken too much of this medicine contact a poison control center or emergency room at once. NOTE: This medicine is only for you. Do not share this  medicine with others. What if I miss a dose? It is important not to miss a dose. Call your doctor or health care professional if you are unable to keep an appointment. What may interact with this medicine? This medicine may interact with the following medications:  foscarnet  certain antibiotics like amikacin, gentamicin, neomycin, polymyxin B, streptomycin, tobramycin, vancomycin This list may not describe all possible interactions. Give your health care provider a list of all the medicines, herbs, non-prescription drugs, or dietary supplements you use. Also tell them if you smoke, drink alcohol, or use illegal drugs. Some items may interact with your medicine. What should I watch for while using this medicine? Your condition will be monitored carefully while you are receiving this medicine. You will need important blood work done while you are taking this medicine. This drug may make you feel generally unwell. This is not uncommon, as chemotherapy can affect healthy cells as well as cancer cells. Report any side effects. Continue your course of treatment even though you feel ill unless your doctor tells you to stop. This medicine may increase your risk of getting an infection. Call your healthcare professional for advice if you get a fever, chills, or sore throat, or other symptoms of a cold or flu. Do not treat yourself. Try to avoid being around people who are sick. Avoid taking medicines that contain aspirin, acetaminophen, ibuprofen, naproxen, or ketoprofen unless instructed by your healthcare professional. These medicines may hide a fever. This medicine may increase your risk to bruise or bleed. Call your doctor or health care professional if you notice any unusual bleeding. Be careful brushing and flossing your teeth or using a toothpick because you may get an infection or bleed more easily. If you have any dental work done, tell your dentist you are receiving this medicine. Do not become  pregnant while taking this medicine or for 14 months after stopping it. Women should inform their healthcare professional if they wish to become pregnant or think they might be pregnant. Men should not father a child while taking this medicine and for 11 months after stopping it. There is potential for serious side effects to an unborn child. Talk to your healthcare professional for more information. Do not breast-feed an infant while taking this medicine. This medicine has caused ovarian failure in some women. This medicine may make it more difficult to get pregnant. Talk to your healthcare professional if you are concerned about your fertility. This medicine has caused decreased sperm counts in some men. This may make it more difficult to father a child. Talk to your healthcare professional if you are concerned about your fertility. Drink fluids as directed while you are taking this medicine. This will help protect your kidneys. Call your doctor or health care professional if you get diarrhea. Do not treat yourself. What side  effects may I notice from receiving this medicine? Side effects that you should report to your doctor or health care professional as soon as possible:  allergic reactions like skin rash, itching or hives, swelling of the face, lips, or tongue  blurred vision  changes in vision  decreased hearing or ringing of the ears  nausea, vomiting  pain, redness, or irritation at site where injected  pain, tingling, numbness in the hands or feet  signs and symptoms of bleeding such as bloody or black, tarry stools; red or dark brown urine; spitting up blood or brown material that looks like coffee grounds; red spots on the skin; unusual bruising or bleeding from the eyes, gums, or nose  signs and symptoms of infection like fever; chills; cough; sore throat; pain or trouble passing urine  signs and symptoms of kidney injury like trouble passing urine or change in the amount of  urine  signs and symptoms of low red blood cells or anemia such as unusually weak or tired; feeling faint or lightheaded; falls; breathing problems Side effects that usually do not require medical attention (report to your doctor or health care professional if they continue or are bothersome):  loss of appetite  mouth sores  muscle cramps This list may not describe all possible side effects. Call your doctor for medical advice about side effects. You may report side effects to FDA at 1-800-FDA-1088. Where should I keep my medicine? This drug is given in a hospital or clinic and will not be stored at home. NOTE: This sheet is a summary. It may not cover all possible information. If you have questions about this medicine, talk to your doctor, pharmacist, or health care provider.  2020 Elsevier/Gold Standard (2018-02-22 15:59:17)

## 2019-06-04 ENCOUNTER — Other Ambulatory Visit: Payer: Self-pay

## 2019-06-04 ENCOUNTER — Ambulatory Visit
Admission: RE | Admit: 2019-06-04 | Discharge: 2019-06-04 | Disposition: A | Discharge status: Home / Self Care | Payer: 59 | Source: Ambulatory Visit | Attending: Radiation Oncology | Admitting: Radiation Oncology

## 2019-06-04 ENCOUNTER — Ambulatory Visit: Payer: 59

## 2019-06-04 ENCOUNTER — Inpatient Hospital Stay: Payer: 59

## 2019-06-04 VITALS — BP 168/96 | HR 83 | Temp 98.4°F | Resp 18

## 2019-06-04 DIAGNOSIS — Z510 Encounter for antineoplastic radiation therapy: Secondary | ICD-10-CM | POA: Diagnosis not present

## 2019-06-04 DIAGNOSIS — C3492 Malignant neoplasm of unspecified part of left bronchus or lung: Secondary | ICD-10-CM

## 2019-06-04 MED ORDER — SODIUM CHLORIDE 0.9 % IV SOLN
Freq: Once | INTRAVENOUS | Status: AC
  Filled 2019-06-04: qty 250

## 2019-06-04 MED ORDER — HEPARIN SOD (PORK) LOCK FLUSH 100 UNIT/ML IV SOLN
500.0000 [IU] | Freq: Once | INTRAVENOUS | Status: AC | PRN
  Administered 2019-06-04: 500 [IU]
  Filled 2019-06-04: qty 5

## 2019-06-04 MED ORDER — DEXAMETHASONE SODIUM PHOSPHATE 10 MG/ML IJ SOLN
INTRAMUSCULAR | Status: AC
  Filled 2019-06-04: qty 1

## 2019-06-04 MED ORDER — DEXAMETHASONE SODIUM PHOSPHATE 10 MG/ML IJ SOLN
10.0000 mg | Freq: Once | INTRAMUSCULAR | Status: AC
  Administered 2019-06-04: 10 mg via INTRAVENOUS

## 2019-06-04 MED ORDER — SODIUM CHLORIDE 0.9% FLUSH
10.0000 mL | INTRAVENOUS | Status: DC | PRN
  Administered 2019-06-04: 10 mL
  Filled 2019-06-04: qty 10

## 2019-06-04 MED ORDER — SODIUM CHLORIDE 0.9 % IV SOLN
100.0000 mg/m2 | Freq: Once | INTRAVENOUS | Status: AC
  Administered 2019-06-04: 180 mg via INTRAVENOUS
  Filled 2019-06-04: qty 9

## 2019-06-04 NOTE — Patient Instructions (Signed)
[facility] Discharge Instructions for Patients Receiving Chemotherapy  Today you received the following chemotherapy agents Etoposide  To help prevent nausea and vomiting after your treatment, we encourage you to take your nausea medication as directed   If you develop nausea and vomiting that is not controlled by your nausea medication, call the clinic.   BELOW ARE SYMPTOMS THAT SHOULD BE REPORTED IMMEDIATELY:  *FEVER GREATER THAN 100.5 F  *CHILLS WITH OR WITHOUT FEVER  NAUSEA AND VOMITING THAT IS NOT CONTROLLED WITH YOUR NAUSEA MEDICATION  *UNUSUAL SHORTNESS OF BREATH  *UNUSUAL BRUISING OR BLEEDING  TENDERNESS IN MOUTH AND THROAT WITH OR WITHOUT PRESENCE OF ULCERS  *URINARY PROBLEMS  *BOWEL PROBLEMS  UNUSUAL RASH Items with * indicate a potential emergency and should be followed up as soon as possible.  Feel free to call the clinic should you have any questions or concerns. The clinic phone number is [phone].  Please show the [facility] at check-in to the Emergency Department and triage nurse.

## 2019-06-05 ENCOUNTER — Other Ambulatory Visit: Payer: Self-pay

## 2019-06-05 ENCOUNTER — Inpatient Hospital Stay: Payer: 59

## 2019-06-05 ENCOUNTER — Ambulatory Visit
Admission: RE | Admit: 2019-06-05 | Discharge: 2019-06-05 | Disposition: A | Discharge status: Home / Self Care | Payer: 59 | Source: Ambulatory Visit | Attending: Radiation Oncology | Admitting: Radiation Oncology

## 2019-06-05 ENCOUNTER — Ambulatory Visit: Payer: 59

## 2019-06-05 VITALS — BP 147/90 | HR 78 | Temp 98.0°F

## 2019-06-05 DIAGNOSIS — C3492 Malignant neoplasm of unspecified part of left bronchus or lung: Secondary | ICD-10-CM

## 2019-06-05 DIAGNOSIS — Z510 Encounter for antineoplastic radiation therapy: Secondary | ICD-10-CM | POA: Diagnosis not present

## 2019-06-05 MED ORDER — DEXAMETHASONE SODIUM PHOSPHATE 10 MG/ML IJ SOLN
10.0000 mg | Freq: Once | INTRAMUSCULAR | Status: AC
  Administered 2019-06-05: 10 mg via INTRAVENOUS

## 2019-06-05 MED ORDER — DEXAMETHASONE SODIUM PHOSPHATE 10 MG/ML IJ SOLN
INTRAMUSCULAR | Status: AC
  Filled 2019-06-05: qty 1

## 2019-06-05 MED ORDER — HEPARIN SOD (PORK) LOCK FLUSH 100 UNIT/ML IV SOLN
500.0000 [IU] | Freq: Once | INTRAVENOUS | Status: AC | PRN
  Administered 2019-06-05: 500 [IU]
  Filled 2019-06-05: qty 5

## 2019-06-05 MED ORDER — SODIUM CHLORIDE 0.9% FLUSH
10.0000 mL | INTRAVENOUS | Status: DC | PRN
  Administered 2019-06-05: 10 mL
  Filled 2019-06-05: qty 10

## 2019-06-05 MED ORDER — SODIUM CHLORIDE 0.9 % IV SOLN
Freq: Once | INTRAVENOUS | Status: AC
  Filled 2019-06-05: qty 250

## 2019-06-05 MED ORDER — SODIUM CHLORIDE 0.9 % IV SOLN
100.0000 mg/m2 | Freq: Once | INTRAVENOUS | Status: AC
  Administered 2019-06-05: 180 mg via INTRAVENOUS
  Filled 2019-06-05: qty 9

## 2019-06-05 NOTE — Patient Instructions (Signed)
[facility] Discharge Instructions for Patients Receiving Chemotherapy  Today you received the following chemotherapy agents: Etoposide.  To help prevent nausea and vomiting after your treatment, we encourage you to take your nausea medication as directed.    If you develop nausea and vomiting that is not controlled by your nausea medication, call the clinic.   BELOW ARE SYMPTOMS THAT SHOULD BE REPORTED IMMEDIATELY:  *FEVER GREATER THAN 100.5 F  *CHILLS WITH OR WITHOUT FEVER  NAUSEA AND VOMITING THAT IS NOT CONTROLLED WITH YOUR NAUSEA MEDICATION  *UNUSUAL SHORTNESS OF BREATH  *UNUSUAL BRUISING OR BLEEDING  TENDERNESS IN MOUTH AND THROAT WITH OR WITHOUT PRESENCE OF ULCERS  *URINARY PROBLEMS  *BOWEL PROBLEMS  UNUSUAL RASH Items with * indicate a potential emergency and should be followed up as soon as possible.  Feel free to call the clinic should you have any questions or concerns. The clinic phone number is [phone].  Please show the [facility] at check-in to the Emergency Department and triage nurse.
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
## 2019-06-05 NOTE — Telephone Encounter (Signed)
Patient advised rx was sent and endocrinology referral. She agrees to referral, order signed.

## 2019-06-06 ENCOUNTER — Ambulatory Visit: Payer: 59

## 2019-06-06 ENCOUNTER — Ambulatory Visit
Admission: RE | Admit: 2019-06-06 | Discharge: 2019-06-06 | Disposition: A | Discharge status: Home / Self Care | Payer: 59 | Source: Ambulatory Visit | Attending: Radiation Oncology | Admitting: Radiation Oncology

## 2019-06-06 ENCOUNTER — Ambulatory Visit: Payer: 59 | Admitting: Interventional Cardiology

## 2019-06-06 ENCOUNTER — Other Ambulatory Visit: Payer: Self-pay

## 2019-06-06 DIAGNOSIS — Z510 Encounter for antineoplastic radiation therapy: Secondary | ICD-10-CM | POA: Diagnosis not present

## 2019-06-07 ENCOUNTER — Inpatient Hospital Stay: Payer: 59

## 2019-06-07 ENCOUNTER — Ambulatory Visit: Payer: 59

## 2019-06-07 VITALS — BP 126/92 | HR 90 | Temp 97.8°F

## 2019-06-07 DIAGNOSIS — C3492 Malignant neoplasm of unspecified part of left bronchus or lung: Secondary | ICD-10-CM

## 2019-06-07 DIAGNOSIS — Z510 Encounter for antineoplastic radiation therapy: Secondary | ICD-10-CM | POA: Diagnosis not present

## 2019-06-07 DIAGNOSIS — D701 Agranulocytosis secondary to cancer chemotherapy: Secondary | ICD-10-CM

## 2019-06-07 MED ORDER — PEGFILGRASTIM-JMDB 6 MG/0.6ML ~~LOC~~ SOSY
PREFILLED_SYRINGE | SUBCUTANEOUS | Status: AC
  Filled 2019-06-07: qty 0.6

## 2019-06-07 MED ORDER — TBO-FILGRASTIM 300 MCG/0.5ML ~~LOC~~ SOSY: 300.0000 ug | PREFILLED_SYRINGE | Freq: Once | SUBCUTANEOUS | Status: DC

## 2019-06-07 MED ORDER — PEGFILGRASTIM-JMDB 6 MG/0.6ML ~~LOC~~ SOSY
6.0000 mg | PREFILLED_SYRINGE | Freq: Once | SUBCUTANEOUS | Status: AC
  Administered 2019-06-07: 09:00:00 6 mg via SUBCUTANEOUS

## 2019-06-09 ENCOUNTER — Ambulatory Visit
Admission: RE | Admit: 2019-06-09 | Discharge: 2019-06-09 | Disposition: A | Discharge status: Home / Self Care | Payer: 59 | Source: Ambulatory Visit | Attending: Radiation Oncology | Admitting: Radiation Oncology

## 2019-06-09 ENCOUNTER — Other Ambulatory Visit: Payer: Self-pay

## 2019-06-09 ENCOUNTER — Ambulatory Visit: Payer: 59

## 2019-06-09 DIAGNOSIS — Z510 Encounter for antineoplastic radiation therapy: Secondary | ICD-10-CM | POA: Diagnosis not present

## 2019-06-10 ENCOUNTER — Telehealth: Payer: Self-pay

## 2019-06-10 ENCOUNTER — Ambulatory Visit
Admission: RE | Admit: 2019-06-10 | Discharge: 2019-06-10 | Disposition: A | Discharge status: Home / Self Care | Payer: 59 | Source: Ambulatory Visit | Attending: Radiation Oncology | Admitting: Radiation Oncology

## 2019-06-10 ENCOUNTER — Ambulatory Visit: Payer: 59

## 2019-06-10 ENCOUNTER — Other Ambulatory Visit: Payer: Self-pay

## 2019-06-10 ENCOUNTER — Inpatient Hospital Stay: Payer: 59

## 2019-06-10 ENCOUNTER — Telehealth: Payer: Self-pay | Admitting: Medical Oncology

## 2019-06-10 ENCOUNTER — Encounter: Payer: Self-pay | Admitting: *Deleted

## 2019-06-10 DIAGNOSIS — C3492 Malignant neoplasm of unspecified part of left bronchus or lung: Secondary | ICD-10-CM

## 2019-06-10 DIAGNOSIS — Z510 Encounter for antineoplastic radiation therapy: Secondary | ICD-10-CM | POA: Diagnosis not present

## 2019-06-10 LAB — CMP (CANCER CENTER ONLY)
ALT: 8 U/L (ref 0–44)
AST: 8 U/L — ABNORMAL LOW (ref 15–41)
Albumin: 3.4 g/dL — ABNORMAL LOW (ref 3.5–5.0)
Alkaline Phosphatase: 145 U/L — ABNORMAL HIGH (ref 38–126)
Anion gap: 11 (ref 5–15)
BUN: 21 mg/dL (ref 8–23)
CO2: 27 mmol/L (ref 22–32)
Calcium: 9.2 mg/dL (ref 8.9–10.3)
Chloride: 97 mmol/L — ABNORMAL LOW (ref 98–111)
Creatinine: 0.89 mg/dL (ref 0.44–1.00)
GFR, Est AFR Am: >60 mL/min (ref >60)
GFR, Estimated: >60 mL/min (ref >60)
Glucose, Bld: 404 mg/dL — ABNORMAL HIGH (ref 70–99)
Potassium: 4.2 mmol/L (ref 3.5–5.1)
Sodium: 135 mmol/L (ref 135–145)
Total Bilirubin: 0.9 mg/dL (ref 0.3–1.2)
Total Protein: 6.6 g/dL (ref 6.5–8.1)

## 2019-06-10 LAB — CBC WITH DIFFERENTIAL (CANCER CENTER ONLY)
Abs Immature Granulocytes: 0 10*3/uL (ref 0.00–0.07)
Basophils Absolute: 0 10*3/uL (ref 0.0–0.1)
Basophils Relative: 0 %
Eosinophils Absolute: 0 10*3/uL (ref 0.0–0.5)
Eosinophils Relative: 0 %
HCT: 37 % (ref 36.0–46.0)
Hemoglobin: 12.6 g/dL (ref 12.0–15.0)
Lymphocytes Relative: 10 %
Lymphs Abs: 0.8 10*3/uL (ref 0.7–4.0)
MCH: 30.1 pg (ref 26.0–34.0)
MCHC: 34.1 g/dL (ref 30.0–36.0)
MCV: 88.3 fL (ref 80.0–100.0)
Monocytes Absolute: 0 10*3/uL — ABNORMAL LOW (ref 0.1–1.0)
Monocytes Relative: 0 %
Neutro Abs: 7.5 10*3/uL (ref 1.7–7.7)
Neutrophils Relative %: 90 %
Platelet Count: 155 10*3/uL (ref 150–400)
RBC: 4.19 MIL/uL (ref 3.87–5.11)
RDW: 13.3 % (ref 11.5–15.5)
WBC Count: 8.3 10*3/uL (ref 4.0–10.5)
nRBC: 0 % (ref 0.0–0.2)

## 2019-06-10 LAB — MAGNESIUM: Magnesium: 1.4 mg/dL — CL (ref 1.7–2.4)

## 2019-06-10 NOTE — Telephone Encounter (Signed)
CRITICAL VALUE STICKER  CRITICAL VALUE:mag 1.4  RECEIVER (on-site recipient of call):Christy Abbott NOTIFIED:  06/10/19 1126 MESSENGER (representative from lab):Kelsey  MD NOTIFIED: Julien Nordmann  TIME OF NOTIFICATION:1128  RESPONSE: Pt taking oral supplements mag oxide 400 mg tid. Per Dr Julien Nordmann I instructed pt to increase Mag oxide to qid. Pt voiced understanding.

## 2019-06-10 NOTE — Progress Notes (Signed)
CRITICAL VALUE ALERT  Critical Value:  Mag 1.4  Date & Time Notied:  06/10/19 at 1120  Provider Notified: Diane RN with Dr. Julien Nordmann  Orders Received/Actions taken: Diane RN with Dr. Julien Nordmann states she will alert MD

## 2019-06-10 NOTE — Telephone Encounter (Signed)
LVM re blood glucose 400 today and to f/u with PCP.

## 2019-06-10 NOTE — Telephone Encounter (Signed)
Per patient glucose level at this time is down to 367. Patient advised to continue to use the sliding scale protocol.

## 2019-06-10 NOTE — Telephone Encounter (Signed)
Talked to patient's husband at about 3:30 pm today and he reports her glucose level was "about 500". Advised to administer 12 units of novolog as indicated on last visit to inject 12 units if >400.  He understand and will proceed.  We will call patient back at about 4:30 to see if levels are better.

## 2019-06-10 NOTE — Telephone Encounter (Signed)
Patient called in to let NP Inda Castle

## 2019-06-11 ENCOUNTER — Ambulatory Visit: Payer: 59

## 2019-06-11 ENCOUNTER — Ambulatory Visit
Admission: RE | Admit: 2019-06-11 | Discharge: 2019-06-11 | Disposition: A | Discharge status: Home / Self Care | Payer: 59 | Source: Ambulatory Visit | Attending: Radiation Oncology | Admitting: Radiation Oncology

## 2019-06-11 ENCOUNTER — Other Ambulatory Visit: Payer: Self-pay

## 2019-06-11 DIAGNOSIS — Z510 Encounter for antineoplastic radiation therapy: Secondary | ICD-10-CM | POA: Diagnosis not present

## 2019-06-12 ENCOUNTER — Ambulatory Visit
Admission: RE | Admit: 2019-06-12 | Discharge: 2019-06-12 | Disposition: A | Discharge status: Home / Self Care | Payer: 59 | Source: Ambulatory Visit | Attending: Radiation Oncology | Admitting: Radiation Oncology

## 2019-06-12 ENCOUNTER — Ambulatory Visit: Payer: 59

## 2019-06-12 ENCOUNTER — Other Ambulatory Visit: Payer: Self-pay

## 2019-06-12 DIAGNOSIS — D696 Thrombocytopenia, unspecified: Secondary | ICD-10-CM | POA: Diagnosis not present

## 2019-06-12 DIAGNOSIS — Z87891 Personal history of nicotine dependence: Secondary | ICD-10-CM | POA: Insufficient documentation

## 2019-06-12 DIAGNOSIS — E860 Dehydration: Secondary | ICD-10-CM | POA: Diagnosis not present

## 2019-06-12 DIAGNOSIS — Z807 Family history of other malignant neoplasms of lymphoid, hematopoietic and related tissues: Secondary | ICD-10-CM | POA: Insufficient documentation

## 2019-06-12 DIAGNOSIS — E0590 Thyrotoxicosis, unspecified without thyrotoxic crisis or storm: Secondary | ICD-10-CM | POA: Insufficient documentation

## 2019-06-12 DIAGNOSIS — Z510 Encounter for antineoplastic radiation therapy: Secondary | ICD-10-CM | POA: Insufficient documentation

## 2019-06-12 DIAGNOSIS — C3492 Malignant neoplasm of unspecified part of left bronchus or lung: Secondary | ICD-10-CM | POA: Insufficient documentation

## 2019-06-12 DIAGNOSIS — C3412 Malignant neoplasm of upper lobe, left bronchus or lung: Secondary | ICD-10-CM | POA: Insufficient documentation

## 2019-06-12 DIAGNOSIS — I4891 Unspecified atrial fibrillation: Secondary | ICD-10-CM | POA: Insufficient documentation

## 2019-06-12 DIAGNOSIS — Z79899 Other long term (current) drug therapy: Secondary | ICD-10-CM | POA: Insufficient documentation

## 2019-06-12 DIAGNOSIS — R112 Nausea with vomiting, unspecified: Secondary | ICD-10-CM | POA: Diagnosis not present

## 2019-06-12 DIAGNOSIS — Z5111 Encounter for antineoplastic chemotherapy: Secondary | ICD-10-CM | POA: Diagnosis present

## 2019-06-12 DIAGNOSIS — Z794 Long term (current) use of insulin: Secondary | ICD-10-CM | POA: Insufficient documentation

## 2019-06-12 DIAGNOSIS — Z9049 Acquired absence of other specified parts of digestive tract: Secondary | ICD-10-CM | POA: Insufficient documentation

## 2019-06-12 DIAGNOSIS — Z86711 Personal history of pulmonary embolism: Secondary | ICD-10-CM | POA: Diagnosis not present

## 2019-06-12 DIAGNOSIS — Z833 Family history of diabetes mellitus: Secondary | ICD-10-CM | POA: Insufficient documentation

## 2019-06-12 DIAGNOSIS — R7989 Other specified abnormal findings of blood chemistry: Secondary | ICD-10-CM | POA: Insufficient documentation

## 2019-06-12 DIAGNOSIS — Z7901 Long term (current) use of anticoagulants: Secondary | ICD-10-CM | POA: Diagnosis not present

## 2019-06-12 DIAGNOSIS — E119 Type 2 diabetes mellitus without complications: Secondary | ICD-10-CM | POA: Insufficient documentation

## 2019-06-12 DIAGNOSIS — I10 Essential (primary) hypertension: Secondary | ICD-10-CM | POA: Insufficient documentation

## 2019-06-12 DIAGNOSIS — Z8349 Family history of other endocrine, nutritional and metabolic diseases: Secondary | ICD-10-CM | POA: Diagnosis not present

## 2019-06-12 DIAGNOSIS — R0609 Other forms of dyspnea: Secondary | ICD-10-CM | POA: Diagnosis not present

## 2019-06-12 DIAGNOSIS — I959 Hypotension, unspecified: Secondary | ICD-10-CM | POA: Diagnosis not present

## 2019-06-13 ENCOUNTER — Ambulatory Visit: Payer: 59

## 2019-06-13 ENCOUNTER — Ambulatory Visit
Admission: RE | Admit: 2019-06-13 | Discharge: 2019-06-13 | Disposition: A | Discharge status: Home / Self Care | Payer: 59 | Source: Ambulatory Visit | Attending: Radiation Oncology | Admitting: Radiation Oncology

## 2019-06-13 ENCOUNTER — Other Ambulatory Visit: Payer: Self-pay

## 2019-06-13 DIAGNOSIS — Z5111 Encounter for antineoplastic chemotherapy: Secondary | ICD-10-CM | POA: Diagnosis not present

## 2019-06-14 ENCOUNTER — Other Ambulatory Visit: Payer: Self-pay | Admitting: Family

## 2019-06-16 ENCOUNTER — Ambulatory Visit: Payer: 59

## 2019-06-16 ENCOUNTER — Other Ambulatory Visit: Payer: Self-pay

## 2019-06-16 ENCOUNTER — Ambulatory Visit
Admission: RE | Admit: 2019-06-16 | Discharge: 2019-06-16 | Disposition: A | Discharge status: Home / Self Care | Payer: 59 | Source: Ambulatory Visit | Attending: Radiation Oncology | Admitting: Radiation Oncology

## 2019-06-16 DIAGNOSIS — Z5111 Encounter for antineoplastic chemotherapy: Secondary | ICD-10-CM | POA: Diagnosis not present

## 2019-06-17 ENCOUNTER — Other Ambulatory Visit: Payer: Self-pay

## 2019-06-17 ENCOUNTER — Inpatient Hospital Stay: Payer: 59 | Attending: Internal Medicine

## 2019-06-17 ENCOUNTER — Ambulatory Visit: Payer: 59

## 2019-06-17 ENCOUNTER — Inpatient Hospital Stay: Payer: 59

## 2019-06-17 ENCOUNTER — Telehealth: Payer: Self-pay | Admitting: Medical Oncology

## 2019-06-17 ENCOUNTER — Encounter: Payer: Self-pay | Admitting: Internal Medicine

## 2019-06-17 ENCOUNTER — Ambulatory Visit
Admission: RE | Admit: 2019-06-17 | Discharge: 2019-06-17 | Disposition: A | Discharge status: Home / Self Care | Payer: 59 | Source: Ambulatory Visit | Attending: Radiation Oncology | Admitting: Radiation Oncology

## 2019-06-17 DIAGNOSIS — Z7901 Long term (current) use of anticoagulants: Secondary | ICD-10-CM | POA: Insufficient documentation

## 2019-06-17 DIAGNOSIS — R0609 Other forms of dyspnea: Secondary | ICD-10-CM | POA: Insufficient documentation

## 2019-06-17 DIAGNOSIS — Z8349 Family history of other endocrine, nutritional and metabolic diseases: Secondary | ICD-10-CM | POA: Insufficient documentation

## 2019-06-17 DIAGNOSIS — Z79899 Other long term (current) drug therapy: Secondary | ICD-10-CM | POA: Insufficient documentation

## 2019-06-17 DIAGNOSIS — C3492 Malignant neoplasm of unspecified part of left bronchus or lung: Secondary | ICD-10-CM

## 2019-06-17 DIAGNOSIS — Z794 Long term (current) use of insulin: Secondary | ICD-10-CM | POA: Insufficient documentation

## 2019-06-17 DIAGNOSIS — Z5111 Encounter for antineoplastic chemotherapy: Secondary | ICD-10-CM | POA: Diagnosis not present

## 2019-06-17 DIAGNOSIS — I10 Essential (primary) hypertension: Secondary | ICD-10-CM | POA: Insufficient documentation

## 2019-06-17 DIAGNOSIS — R112 Nausea with vomiting, unspecified: Secondary | ICD-10-CM | POA: Insufficient documentation

## 2019-06-17 DIAGNOSIS — D696 Thrombocytopenia, unspecified: Secondary | ICD-10-CM | POA: Insufficient documentation

## 2019-06-17 DIAGNOSIS — C3412 Malignant neoplasm of upper lobe, left bronchus or lung: Secondary | ICD-10-CM | POA: Insufficient documentation

## 2019-06-17 DIAGNOSIS — Z87891 Personal history of nicotine dependence: Secondary | ICD-10-CM | POA: Insufficient documentation

## 2019-06-17 DIAGNOSIS — E0590 Thyrotoxicosis, unspecified without thyrotoxic crisis or storm: Secondary | ICD-10-CM | POA: Insufficient documentation

## 2019-06-17 DIAGNOSIS — E119 Type 2 diabetes mellitus without complications: Secondary | ICD-10-CM | POA: Insufficient documentation

## 2019-06-17 DIAGNOSIS — Z86711 Personal history of pulmonary embolism: Secondary | ICD-10-CM | POA: Insufficient documentation

## 2019-06-17 DIAGNOSIS — I959 Hypotension, unspecified: Secondary | ICD-10-CM | POA: Insufficient documentation

## 2019-06-17 DIAGNOSIS — E860 Dehydration: Secondary | ICD-10-CM | POA: Insufficient documentation

## 2019-06-17 LAB — CBC WITH DIFFERENTIAL (CANCER CENTER ONLY)
Abs Immature Granulocytes: 1.42 10*3/uL — ABNORMAL HIGH (ref 0.00–0.07)
Basophils Absolute: 0 10*3/uL (ref 0.0–0.1)
Basophils Relative: 0 %
Eosinophils Absolute: 0.1 10*3/uL (ref 0.0–0.5)
Eosinophils Relative: 1 %
HCT: 28.9 % — ABNORMAL LOW (ref 36.0–46.0)
Hemoglobin: 10.1 g/dL — ABNORMAL LOW (ref 12.0–15.0)
Immature Granulocytes: 11 %
Lymphocytes Relative: 13 %
Lymphs Abs: 1.7 10*3/uL (ref 0.7–4.0)
MCH: 30.3 pg (ref 26.0–34.0)
MCHC: 34.9 g/dL (ref 30.0–36.0)
MCV: 86.8 fL (ref 80.0–100.0)
Monocytes Absolute: 1.2 10*3/uL — ABNORMAL HIGH (ref 0.1–1.0)
Monocytes Relative: 9 %
Neutro Abs: 8.3 10*3/uL — ABNORMAL HIGH (ref 1.7–7.7)
Neutrophils Relative %: 66 %
Platelet Count: 23 10*3/uL — ABNORMAL LOW (ref 150–400)
RBC: 3.33 MIL/uL — ABNORMAL LOW (ref 3.87–5.11)
RDW: 13.2 % (ref 11.5–15.5)
WBC Count: 12.7 10*3/uL — ABNORMAL HIGH (ref 4.0–10.5)
nRBC: 0 % (ref 0.0–0.2)

## 2019-06-17 LAB — CMP (CANCER CENTER ONLY)
ALT: 8 U/L (ref 0–44)
AST: 9 U/L — ABNORMAL LOW (ref 15–41)
Albumin: 3.1 g/dL — ABNORMAL LOW (ref 3.5–5.0)
Alkaline Phosphatase: 134 U/L — ABNORMAL HIGH (ref 38–126)
Anion gap: 11 (ref 5–15)
BUN: 14 mg/dL (ref 8–23)
CO2: 31 mmol/L (ref 22–32)
Calcium: 9.2 mg/dL (ref 8.9–10.3)
Chloride: 102 mmol/L (ref 98–111)
Creatinine: 0.8 mg/dL (ref 0.44–1.00)
GFR, Est AFR Am: >60 mL/min (ref >60)
GFR, Estimated: >60 mL/min (ref >60)
Glucose, Bld: 230 mg/dL — ABNORMAL HIGH (ref 70–99)
Potassium: 3.7 mmol/L (ref 3.5–5.1)
Sodium: 144 mmol/L (ref 135–145)
Total Bilirubin: 0.3 mg/dL (ref 0.3–1.2)
Total Protein: 6.1 g/dL — ABNORMAL LOW (ref 6.5–8.1)

## 2019-06-17 LAB — MAGNESIUM: Magnesium: 1.4 mg/dL — CL (ref 1.7–2.4)

## 2019-06-17 NOTE — Telephone Encounter (Signed)
CRITICAL VALUE STICKER  CRITICAL VALUE:magnesieum 1.4  RECEIVER (on-site recipient of call):Yariah Selvey  DATE & TIME NOTIFIED: 06/17/19 1130  MESSENGER (representative from lab):Ski Polich  MD NOTIFIED: Julien Nordmann TIME OF NOTIFICATION:06/17/19 1145    RESPONSE: supplement Mag iv 4 gms today.  Pt notified .

## 2019-06-18 ENCOUNTER — Ambulatory Visit
Admission: RE | Admit: 2019-06-18 | Discharge: 2019-06-18 | Disposition: A | Discharge status: Home / Self Care | Payer: 59 | Source: Ambulatory Visit | Attending: Radiation Oncology | Admitting: Radiation Oncology

## 2019-06-18 ENCOUNTER — Other Ambulatory Visit: Payer: Self-pay

## 2019-06-18 ENCOUNTER — Encounter: Payer: Self-pay | Admitting: Internal Medicine

## 2019-06-18 ENCOUNTER — Ambulatory Visit: Payer: 59

## 2019-06-18 ENCOUNTER — Inpatient Hospital Stay: Payer: 59

## 2019-06-18 VITALS — BP 130/75 | HR 78 | Temp 98.0°F | Resp 18

## 2019-06-18 DIAGNOSIS — Z5111 Encounter for antineoplastic chemotherapy: Secondary | ICD-10-CM | POA: Diagnosis not present

## 2019-06-18 DIAGNOSIS — Z95828 Presence of other vascular implants and grafts: Secondary | ICD-10-CM

## 2019-06-18 MED ORDER — MAGNESIUM SULFATE 4 GM/100ML IV SOLN
4.0000 g | Freq: Once | INTRAVENOUS | Status: AC
  Administered 2019-06-18: 4 g via INTRAVENOUS
  Filled 2019-06-18: qty 100

## 2019-06-18 MED ORDER — SODIUM CHLORIDE 0.9 % IV SOLN
Freq: Once | INTRAVENOUS | Status: AC
  Filled 2019-06-18: qty 250

## 2019-06-18 MED ORDER — HEPARIN SOD (PORK) LOCK FLUSH 100 UNIT/ML IV SOLN
500.0000 [IU] | Freq: Once | INTRAVENOUS | Status: AC
  Administered 2019-06-18: 10:00:00 500 [IU] via INTRAVENOUS
  Filled 2019-06-18: qty 5

## 2019-06-18 MED ORDER — SODIUM CHLORIDE 0.9% FLUSH
10.0000 mL | Freq: Once | INTRAVENOUS | Status: AC
  Administered 2019-06-18: 10:00:00 10 mL via INTRAVENOUS
  Filled 2019-06-18: qty 10

## 2019-06-18 NOTE — Patient Instructions (Signed)
Magnesium Sulfate injection What is this medicine? MAGNESIUM SULFATE (mag NEE zee um SUL fate) is an electrolyte injection commonly used to treat low magnesium levels in your blood. It is also used to prevent or control seizures in women with preeclampsia or eclampsia. This medicine may be used for other purposes; ask your health care provider or pharmacist if you have questions. What should I tell my health care provider before I take this medicine? They need to know if you have any of these conditions:  heart disease  history of irregular heart beat  kidney disease  an unusual or allergic reaction to magnesium sulfate, medicines, foods, dyes, or preservatives  pregnant or trying to get pregnant  breast-feeding How should I use this medicine? This medicine is for infusion into a vein. It is given by a health care professional in a hospital or clinic setting. Talk to your pediatrician regarding the use of this medicine in children. While this drug may be prescribed for selected conditions, precautions do apply. Overdosage: If you think you have taken too much of this medicine contact a poison control center or emergency room at once. NOTE: This medicine is only for you. Do not share this medicine with others. What if I miss a dose? This does not apply. What may interact with this medicine? This medicine may interact with the following medications:  certain medicines for anxiety or sleep  certain medicines for seizures like phenobarbital  digoxin  medicines that relax muscles for surgery  narcotic medicines for pain This list may not describe all possible interactions. Give your health care provider a list of all the medicines, herbs, non-prescription drugs, or dietary supplements you use. Also tell them if you smoke, drink alcohol, or use illegal drugs. Some items may interact with your medicine. What should I watch for while using this medicine? Your condition will be  monitored carefully while you are receiving this medicine. You may need blood work done while you are receiving this medicine. What side effects may I notice from receiving this medicine? Side effects that you should report to your doctor or health care professional as soon as possible:  allergic reactions like skin rash, itching or hives, swelling of the face, lips, or tongue  facial flushing  muscle weakness  signs and symptoms of low blood pressure like dizziness; feeling faint or lightheaded, falls; unusually weak or tired  signs and symptoms of a dangerous change in heartbeat or heart rhythm like chest pain; dizziness; fast or irregular heartbeat; palpitations; breathing problems  sweating This list may not describe all possible side effects. Call your doctor for medical advice about side effects. You may report side effects to FDA at 1-800-FDA-1088. Where should I keep my medicine? This drug is given in a hospital or clinic and will not be stored at home. NOTE: This sheet is a summary. It may not cover all possible information. If you have questions about this medicine, talk to your doctor, pharmacist, or health care provider.  2020 Elsevier/Gold Standard (2015-09-15 12:31:42)  

## 2019-06-19 ENCOUNTER — Ambulatory Visit: Payer: 59

## 2019-06-19 ENCOUNTER — Other Ambulatory Visit: Payer: Self-pay

## 2019-06-19 ENCOUNTER — Ambulatory Visit
Admission: RE | Admit: 2019-06-19 | Discharge: 2019-06-19 | Disposition: A | Discharge status: Home / Self Care | Payer: 59 | Source: Ambulatory Visit | Attending: Radiation Oncology | Admitting: Radiation Oncology

## 2019-06-19 DIAGNOSIS — Z5111 Encounter for antineoplastic chemotherapy: Secondary | ICD-10-CM | POA: Diagnosis not present

## 2019-06-19 NOTE — Progress Notes (Signed)
Pharmacist Chemotherapy Monitoring - Follow Up Assessment    I verify that I have reviewed each item in the below checklist:  . Regimen for the patient is scheduled for the appropriate day and plan matches scheduled date. Marland Kitchen Appropriate non-routine labs are ordered dependent on drug ordered. . If applicable, additional medications reviewed and ordered per protocol based on lifetime cumulative doses and/or treatment regimen.   Plan for follow-up and/or issues identified: No . I-vent associated with next due treatment: No . MD and/or nursing notified: No  Suhail Peloquin K 06/19/2019 10:34 AM

## 2019-06-20 ENCOUNTER — Ambulatory Visit: Payer: 59

## 2019-06-20 ENCOUNTER — Ambulatory Visit
Admission: RE | Admit: 2019-06-20 | Discharge: 2019-06-20 | Disposition: A | Discharge status: Home / Self Care | Payer: 59 | Source: Ambulatory Visit | Attending: Radiation Oncology | Admitting: Radiation Oncology

## 2019-06-20 ENCOUNTER — Other Ambulatory Visit: Payer: Self-pay

## 2019-06-20 DIAGNOSIS — Z5111 Encounter for antineoplastic chemotherapy: Secondary | ICD-10-CM | POA: Diagnosis not present

## 2019-06-23 ENCOUNTER — Ambulatory Visit (INDEPENDENT_AMBULATORY_CARE_PROVIDER_SITE_OTHER): Payer: 59 | Admitting: Endocrinology

## 2019-06-23 ENCOUNTER — Ambulatory Visit
Admission: RE | Admit: 2019-06-23 | Discharge: 2019-06-23 | Disposition: A | Discharge status: Home / Self Care | Payer: 59 | Source: Ambulatory Visit | Attending: Radiation Oncology | Admitting: Radiation Oncology

## 2019-06-23 ENCOUNTER — Other Ambulatory Visit: Payer: Self-pay

## 2019-06-23 ENCOUNTER — Encounter: Payer: Self-pay | Admitting: Endocrinology

## 2019-06-23 ENCOUNTER — Ambulatory Visit: Payer: 59

## 2019-06-23 VITALS — BP 112/62 | HR 89 | Ht 64.0 in | Wt 144.4 lb

## 2019-06-23 DIAGNOSIS — E0590 Thyrotoxicosis, unspecified without thyrotoxic crisis or storm: Secondary | ICD-10-CM | POA: Diagnosis not present

## 2019-06-23 DIAGNOSIS — Z5111 Encounter for antineoplastic chemotherapy: Secondary | ICD-10-CM | POA: Diagnosis not present

## 2019-06-23 DIAGNOSIS — Z794 Long term (current) use of insulin: Secondary | ICD-10-CM | POA: Diagnosis not present

## 2019-06-23 DIAGNOSIS — E1165 Type 2 diabetes mellitus with hyperglycemia: Secondary | ICD-10-CM

## 2019-06-23 LAB — POCT GLYCOSYLATED HEMOGLOBIN (HGB A1C): Hemoglobin A1C: 10.4 % — AB (ref 4.0–5.6)

## 2019-06-23 LAB — T4, FREE: Free T4: 0.85 ng/dL (ref 0.60–1.60)

## 2019-06-23 LAB — GLUCOSE, POCT (MANUAL RESULT ENTRY): POC Glucose: 281 mg/dl — AB (ref 70–99)

## 2019-06-23 LAB — TSH: TSH: 1.19 u[IU]/mL (ref 0.35–4.50)

## 2019-06-23 LAB — T3, FREE: T3, Free: 2.9 pg/mL (ref 2.3–4.2)

## 2019-06-23 MED ORDER — FREESTYLE LIBRE 2 SENSOR MISC: 2.0000 | 3 refills | Status: DC

## 2019-06-23 MED ORDER — FREESTYLE LIBRE 2 READER DEVI: 1.0000 | Freq: Once | 0 refills | Status: AC

## 2019-06-23 NOTE — Progress Notes (Signed)
[name], female   DOB: 1955/11/14, 64 y.o.   MRN: [id number]                                                                                                               Reason for Appointment:  Hyperthyroidism and diabetes, new consultation  Referring healthcare provider: [name], NP   Chief complaint: None   History of Present Illness:   It is unclear what symptoms the patient had a diagnosis but she had a low TSH in 2014 She was referred for endocrinology evaluation but she did not do so because of the cost  Subsequently in 02/2018 she was started on methimazole, at that time she was apparently still not having any symptoms that she can remember including palpitations, shakiness, feeling excessively warm and sweaty, weight loss, and fatigue.   Baseline free T4 and T3 levels were normal but her thyrotropin receptor antibody was high She does not think she felt any different with starting methimazole  Her daughter apparently also has Graves' disease  She has not had any regular follow-up and her last labs were done when she was admitted for sepsis in 1/21, her free T4 was borderline high  Again she did not have any symptoms of hyperthyroidism as above No recent problems with palpitations, she has a past history of atrial fibrillation treated with ablation  She does tend to have some cold intolerance but no unusual fatigue, currently is going through chemotherapy and radiation for lung cancer  She takes her methimazole 15 mg daily  Wt Readings from Last 3 Encounters:  06/23/19 144 lb 6.4 oz (65.5 kg)  06/02/19 147 lb 11.2 oz (67 kg)  05/22/19 143 lb 3.2 oz (65 kg)    Thyroid function tests as follows:     Lab Results  Component Value Date   FREET4 1.13 (H) 03/22/2019   FREET4 1.05 02/25/2018   FREET4 1.28 05/16/2016   T3FREE 1.6 (L) 03/22/2019   T3FREE 3.5 02/25/2018   T3FREE 3.7 05/16/2016   TSH 0.132 (L) 03/22/2019   TSH 0.05 (L)  05/28/2018   TSH 0.04 (L) 02/25/2018     Lab Results  Component Value Date   THYROTRECAB 41.3 (H) 03/08/2018   DIABETES management:  Date of diagnosis of type 2 diabetes mellitus:   2015?      Background history:  She has been on Metformin since diagnosis when her diabetes was relatively mild Subsequently Amaryl added After 2018 her blood sugars appear to be progressively worsening with increasing A1c She thinks that sometimes she would forget to take her medications in the morning and blood sugars would be higher for this reason She was admitted for severe hypoglycemia with her sepsis and recommended to be on insulin in 03/2019  Recent history:   Most recent A1c is 10.4 done on 06/23/2019, previously 13.9  INSULIN regimen is: Lantus 10 units at night, NovoLog 2-4 units only if blood sugar is over 250   Non-insulin hypoglycemic drugs the patient is taking  are: Metformin 2 g twice daily, Amaryl 4 mg daily  Current management, blood sugar patterns and problems identified:  She checks her blood sugar with her [employer] brand meter and does not check it every day, did not bring any diary or her meter today   Blood sugars have been as high as about 400 more recently and usually tend to go up for a few days after she gets her 10 mg Decadron for her chemotherapy which she has had twice and is getting this every 3 weeks  She says her blood sugars are as low as 140 but even though her last Decadron dose was on 3/25 the last couple of days her blood sugars are still around 189-190  Today blood sugar is 281 after lunch  She has not followed up with her PCP recently for diabetes  She continues to take Amaryl and Metformin        Side effects from medications have been: None  Typical meal intake: Breakfast is mostly eggs and toast               Exercise:  None  Glucose monitoring:  done about 1 times a day         Glucometer:  [employer] brand    Blood Glucose readings as  above    Dietician visit, most recent: Never  Weight history: Wt Readings from Last 3 Encounters:  06/23/19 144 lb 6.4 oz (65.5 kg)  06/02/19 147 lb 11.2 oz (67 kg)  05/22/19 143 lb 3.2 oz (65 kg)   Lab Results  Component Value Date   HGBA1C 10.4 (A) 06/23/2019   HGBA1C 13.9 (H) 03/23/2019   HGBA1C 10.9 (H) 05/28/2018   Lab Results  Component Value Date   MICROALBUR <0.7 05/10/2015   LDLCALC 46 02/21/2018   CREATININE 0.80 06/17/2019     Allergies as of 06/23/2019      Reactions   Jardiance [empagliflozin] Palpitations   Causes heart palpitations   Trazodone And Nefazodone Other (See Comments)   "hyped me up, could not sleep"      Medication List       Accurate as of June 23, 2019  3:58 PM. If you have any questions, ask your nurse or doctor.        STOP taking these medications   cephALEXin 500 MG capsule Commonly known as: KEFLEX Stopped by: Elayne Snare, MD     TAKE these medications   albuterol 108 (90 Base) MCG/ACT inhaler Commonly known as: VENTOLIN HFA Inhale 2 puffs into the lungs every 6 (six) hours as needed for wheezing or shortness of breath.   B-D UF III MINI PEN NEEDLES 31G X 5 MM Misc Generic drug: Insulin Pen Needle Use with insulin pen   CALTRATE 600+D3 PO Take 1 tablet by mouth daily.   carvedilol 12.5 MG tablet Commonly known as: COREG Take 1 tablet (12.5 mg total) by mouth in the morning and at bedtime.   diphenhydramine-acetaminophen 25-500 MG Tabs tablet Commonly known as: TYLENOL PM Take 2 tablets by mouth at bedtime. What changed: Another medication with the same name was removed. Continue taking this medication, and follow the directions you see here. Changed by: Elayne Snare, MD   ESTER C PO Take 1 tablet by mouth daily.   Fish Oil 1200 MG Caps Take 1,200 mg by mouth daily.   flecainide 50 MG tablet Commonly known as: TAMBOCOR TAKE 1 TABLET BY MOUTH TWICE DAILY, MAY TAKE AN ADDITIONAL 1 TABLET AS  NEEDED. Please keep  upcoming appt in January. Thank you What changed:   how much to take  how to take this  when to take this  additional instructions   FreeStyle Libre 2 Reader [facility] 1 Device by Does not apply route once for 1 dose. Started by: Elayne Snare, MD   FreeStyle Libre 2 Sensor Misc 2 Devices by Does not apply route every 14 (fourteen) days. Started by: Elayne Snare, MD   furosemide 20 MG tablet Commonly known as: LASIX Take 1 tablet (20 mg total) by mouth daily as needed. What changed: additional instructions   glimepiride 4 MG tablet Commonly known as: AMARYL Take 1 tablet (4 mg total) by mouth daily with breakfast.   guaiFENesin-dextromethorphan 100-10 MG/5ML syrup Commonly known as: ROBITUSSIN DM Take 5 mLs by mouth every 4 (four) hours as needed for cough.   insulin aspart 100 UNIT/ML FlexPen Commonly known as: NOVOLOG Inject subcutaneously 3 times daily prior to meals per sliding scale   insulin glargine 100 unit/mL Sopn Commonly known as: LANTUS Inject 0.1 mLs (10 Units total) into the skin daily. What changed: when to take this   Lancets 30G Misc Check sugar twice daily.   lidocaine-prilocaine cream Commonly known as: EMLA Apply 1 application topically as needed. Using a cotton ball , apply a small amount of cream to the skin over the porta cath site. Do not rub in the cream. Cover with Plastic wrap.   lisinopril-hydrochlorothiazide 20-25 MG tablet Commonly known as: ZESTORETIC Take 1 tablet by mouth daily.   magnesium oxide 400 (241.3 Mg) MG tablet Commonly known as: MAG-OX Take 1 tablet (400 mg total) by mouth 3 (three) times daily.   metFORMIN 500 MG tablet Commonly known as: GLUCOPHAGE TAKE 2 TABLETS BY MOUTH TWICE DAILY . APPOINTMENT REQUIRED FOR FUTURE REFILLS   methimazole 10 MG tablet Commonly known as: TAPAZOLE TAKE 1 & 1/2 (ONE & ONE-HALF) TABLETS BY MOUTH ONCE DAILY   ondansetron 4 MG tablet Commonly known as: Zofran Take 1 tablet (4 mg total) by  mouth every 8 (eight) hours as needed for nausea or vomiting.   oxycodone 5 MG capsule Commonly known as: OXY-IR 1 to 2 caps Q 4 hours prn pain   potassium chloride SA 20 MEQ tablet Commonly known as: KLOR-CON TAKE 1  BY MOUTH ONCE DAILY What changed:   how much to take  how to take this  when to take this  additional instructions   prochlorperazine 10 MG tablet Commonly known as: COMPAZINE Take 1 tablet (10 mg total) by mouth every 6 (six) hours as needed for nausea or vomiting.   simvastatin 10 MG tablet Commonly known as: ZOCOR TAKE 1 TABLET BY MOUTH AT BEDTIME   sucralfate 1 g tablet Commonly known as: Carafate Take 1 tablet (1 g total) by mouth 4 (four) times daily -  with meals and at bedtime. Crush and dissolve in 10 mL of warm water then swallow   VISINE OP Place 1 drop into both eyes daily as needed (irritation).           Past Medical History:  Diagnosis Date  . Arthritis   . Atrial flutter [facility])    s/p CTI by Dr Lovena Le 02/2013  . Chest pain   . Diabetes mellitus without complication ([place])   . Dysrhythmia    hx of Aflutter  . 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 Hemochromatosis associated with compound heterozygous mutation in HFE gene ([place])  12/10/2017  . Hyperlipidemia   . Hypertension   . Hyperthyroidism 10/31/2014    Past Surgical History:  Procedure Laterality Date  . ABLATION  03-04-2013   CTI by Dr Lovena Le for atrial flutter  . ATRIAL FLUTTER ABLATION N/A 03/04/2013   Procedure: ATRIAL FLUTTER ABLATION;  Surgeon: Evans Lance, MD;  Location: [facility] CATH LAB;  Service: Cardiovascular;  Laterality: N/A;  . CHOLECYSTECTOMY  1982  . COLONOSCOPY W/ POLYPECTOMY     x 2  . ESOPHAGOGASTRODUODENOSCOPY N/A 05/20/2014   Procedure: ESOPHAGOGASTRODUODENOSCOPY (EGD);  Surgeon: Teena Irani, MD;  Location: [facility] ENDOSCOPY;  Service: Endoscopy;  Laterality: N/A;  . IR IMAGING GUIDED PORT INSERTION  05/09/2019  . TONSILLECTOMY  1972 ?  Marland Kitchen VIDEO  BRONCHOSCOPY WITH ENDOBRONCHIAL ULTRASOUND Left 04/30/2019   Procedure: VIDEO BRONCHOSCOPY WITH ENDOBRONCHIAL ULTRASOUND;  Surgeon: Garner Nash, DO;  Location: [place];  Service: Pulmonary;  Laterality: Left;    Family History  Problem Relation Age of Onset  . Atrial fibrillation Mother   . Arrhythmia Mother   . Diabetes Mother   . Thyroid disease Mother   . Hodgkin's lymphoma Father   . Thyroid disease Daughter        Graves disease  . Diabetes Daughter   . Anxiety disorder Neg Hx     Social History:  reports that she quit smoking about 3 months ago. Her smoking use included cigarettes. She smoked 1.00 pack per day. She has never used smokeless tobacco. She reports that she does not drink alcohol or use drugs.  Allergies:  Allergies  Allergen Reactions  . Jardiance [Empagliflozin] Palpitations    Causes heart palpitations  . Trazodone And Nefazodone Other (See Comments)    "hyped me up, could not sleep"     Review of Systems  Constitutional: Negative for weight loss and weight gain.  Eyes: Negative for blurred vision.  Respiratory: Negative for shortness of breath.   Cardiovascular: Negative for palpitations and leg swelling.  Gastrointestinal: Negative for nausea, constipation and diarrhea.  Endocrine: Positive for cold intolerance.  Genitourinary: Negative for frequency.  Musculoskeletal: Positive for joint pain.  Skin: Negative for rash.  Neurological: Negative for numbness and tingling.       Examination:   BP 112/62 (BP Location: Left Arm, Patient Position: Sitting, Cuff Size: Normal)   Pulse 89   Ht 5\' 4"  (1.626 m)   Wt 144 lb 6.4 oz (65.5 kg)   SpO2 98%   BMI 24.79 kg/m    General Appearance:  well-built and nourished, pleasant, not anxious       Eyes: No abnormal prominence, lid lag or stare present.  No swelling of the eyelids   Neck: The thyroid is nonpalpable  There is no lymphadenopathy in the neck .           Heart: normal S1 and S2, no  murmurs .          Lungs: breath sounds are normal bilaterally without added sounds  Abdomen: no hepatosplenomegaly or other palpable abnormality  Extremities: hands are normal temperature. No ankle edema.  Neurological:  No tremors are present. Deep tendon reflexes at biceps are absent and also at ankles.  Vibration sense is moderately reduced in the toes  Diabetic Foot Exam - Simple   Simple Foot Form Diabetic Foot exam was performed with the following findings: Yes   Visual Inspection No deformities, no ulcerations, no other skin breakdown bilaterally: Yes Sensation Testing Intact to touch and monofilament testing bilaterally: Yes  Pulse Check Posterior Tibialis and Dorsalis pulse intact bilaterally: Yes Comments      Skin: No rash, abnormal thickening of the skin on the lower legs seen     Assessment/Plan:   Hyperthyroidism, subclinical and confirmed to be from Graves' disease   She likely has had longstanding subclinical hyperthyroidism for many years presenting only as a low TSH  She has been on treatment for over a year and difficult to know if she had any baseline symptoms of hyperthyroidism, no details are available in the patient records. Her last labs indicated a slightly high free T4 and slightly low TSH and T3 but this was done during an episode of acute illness and no recent thyroid levels are available Previously had not had any abnormally high T4 or T3 levels  She is having some cold intolerance but no significant fatigue Currently on 15 mg of methimazole daily as a stable dose   Discussed with the patient the hyperthyroidism is a result of an autoimmune condition involving the thyroid.  Explained that the options for treatment are basically antithyroid drugs and radioactive iodine.  Discussed the pros and cons for both treatments However unlikely that patient will need to be on long-term methimazole since her hyperthyroidism was mild and she may be in  remission now  She does need follow-up labs drawn to decide on her treatment plan is also levels of thyrotropin receptor antibody  Patient handout on hyperthyroidism given Patient understands the above discussion and treatment options. All questions were answered satisfactorily  DIABETES assessment:  See history of present illness for detailed discussion of current diabetes management, blood sugar patterns and problems identified  She has had progressively increasing A1c and now requiring insulin With currently very low doses of Lantus her blood sugars are still not controlled and A1c is still over 10% This indicates likely significant postprandial hyperglycemia that she does not monitor Blood sugars are considerably worse with periodic doses of high-dose dexamethasone with her chemotherapy over the last few weeks  No evidence of diabetes complications on exam However does need urine microalbumin checked  DIABETES RECOMMENDATIONS:  . Glucose monitoring: Patient advised to check readings at least 2-3 times a day either fasting or 2 hours after meals and she will need to keep a record.  Given blood sugar log to write down her blood sugars and insulin doses She is interested in the freestyle libre and prescription was sent for this, she thinks he can have her daughter help her start this  . Diabetes education: Patient will need nutritional counseling  . Lifestyle changes: She will follow instructions given by dietitian Encouraged her to start a regular walking program  Insulin/medication changes: The following is a copy of the patient instructions  NOVOLOG insulin: Take this before starting to eat every meal regardless of blood sugar Start with 4 units before breakfast and 6 units before lunch and dinner If the blood sugar after the meal is still over 180 for that meal increase the dose by at least 2 units the next day  LANTUS insulin: Go up to 12 units starting tonight and if in  3 days the morning sugar is still over 130 go up to 14 units  Increase ALL INSULIN doses by threefold on the day of your chemotherapy/steroids for at least 3 days.   It may be more convenient to take the Lantus 15 units twice a day starting the morning of chemotherapy  When blood sugars are below 200 after chemotherapy start  reducing the dose of all doses 50% until blood sugars are normal  Continue METFORMIN and stop Amaryl  Follow-up in 1 month  Consult note sent to referring physician  Patient Instructions  GLUCOSE monitoring: Check blood sugar before meals at least twice a day and at least once a day or more about 2 hours after eating. Check sugars more often about 4-5 times a day when getting chemotherapy  Blood sugar targets 90-130 before meals and under 180 after meals  NOVOLOG insulin: Take this before starting to eat every meal regardless of blood sugar Start with 4 units before breakfast and 6 units before lunch and dinner If the blood sugar after the meal is still over 180 for that meal increase the dose by at least 2 units the next day  LANTUS insulin: Go up to 12 units starting tonight and if in 3 days the morning sugar is still over 130 go up to 14 units  Increase ALL INSULIN doses by threefold on the day of your chemotherapy/steroids for at least 3 days.   It may be more convenient to take the Lantus 15 units twice a day starting the morning of chemotherapy  When blood sugars are below 200 after chemotherapy start reducing the dose of all doses 50% until blood sugars are normal .    Elayne Snare 06/23/2019, 3:58 PM    Note: This office note was prepared with Dragon voice recognition system technology. Any transcriptional errors that result from this process are unintentional.

## 2019-06-23 NOTE — Patient Instructions (Signed)
GLUCOSE monitoring: Check blood sugar before meals at least twice a day and at least once a day or more about 2 hours after eating. Check sugars more often about 4-5 times a day when getting chemotherapy  Blood sugar targets 90-130 before meals and under 180 after meals  NOVOLOG insulin: Take this before starting to eat every meal regardless of blood sugar Start with 4 units before breakfast and 6 units before lunch and dinner If the blood sugar after the meal is still over 180 for that meal increase the dose by at least 2 units the next day  LANTUS insulin: Go up to 12 units starting tonight and if in 3 days the morning sugar is still over 130 go up to 14 units  Increase ALL INSULIN doses by threefold on the day of your chemotherapy/steroids for at least 3 days.   It may be more convenient to take the Lantus 15 units twice a day starting the morning of chemotherapy  When blood sugars are below 200 after chemotherapy start reducing the dose of all doses 50% until blood sugars are normal .

## 2019-06-24 ENCOUNTER — Other Ambulatory Visit: Payer: Self-pay

## 2019-06-24 ENCOUNTER — Encounter (HOSPITAL_COMMUNITY): Payer: Self-pay

## 2019-06-24 ENCOUNTER — Ambulatory Visit (HOSPITAL_COMMUNITY)
Admission: RE | Admit: 2019-06-24 | Discharge: 2019-06-24 | Disposition: A | Discharge status: Home / Self Care | Payer: 59 | Source: Ambulatory Visit | Attending: Internal Medicine | Admitting: Internal Medicine

## 2019-06-24 ENCOUNTER — Ambulatory Visit
Admission: RE | Admit: 2019-06-24 | Discharge: 2019-06-24 | Disposition: A | Discharge status: Home / Self Care | Payer: 59 | Source: Ambulatory Visit | Attending: Radiation Oncology | Admitting: Radiation Oncology

## 2019-06-24 ENCOUNTER — Inpatient Hospital Stay (HOSPITAL_BASED_OUTPATIENT_CLINIC_OR_DEPARTMENT_OTHER): Payer: 59 | Admitting: Medical

## 2019-06-24 ENCOUNTER — Encounter: Payer: Self-pay | Admitting: Radiation Oncology

## 2019-06-24 ENCOUNTER — Ambulatory Visit: Payer: 59

## 2019-06-24 VITALS — BP 138/84 | HR 92 | Temp 98.2°F | Resp 18 | Ht 64.0 in | Wt 146.6 lb

## 2019-06-24 DIAGNOSIS — C3490 Malignant neoplasm of unspecified part of unspecified bronchus or lung: Secondary | ICD-10-CM | POA: Insufficient documentation

## 2019-06-24 DIAGNOSIS — I2699 Other pulmonary embolism without acute cor pulmonale: Secondary | ICD-10-CM | POA: Diagnosis not present

## 2019-06-24 LAB — THYROTROPIN RECEPTOR AUTOABS: Thyrotropin Receptor Ab: 2.72 IU/L — ABNORMAL HIGH (ref 0.00–1.75)

## 2019-06-24 IMAGING — CT CT CHEST W/ CM
2 of 4 series · 15 of 36 positions shown, 18 images · IV contrast (OMNIPAQUE)
Comparison: [DATE] PET-CT. [DATE] chest CT angiogram.

CLINICAL DATA: Limited stage small cell left upper lobe carcinoma
diagnosed [DATE] with ongoing chemotherapy and radiation
therapy. Restaging.

EXAM:
CT CHEST WITH CONTRAST
TECHNIQUE: Multidetector CT imaging of the chest was performed during
intravenous contrast administration.
CONTRAST:  75mL OMNIPAQUE IOHEXOL 300 MG/ML  SOLN

[Series 2: axial st · axial · 0.74mm/px · z∈[-54,+208]mm · 12 of 153 slices shown, 15 images]
[im 11/153  mediastinal]
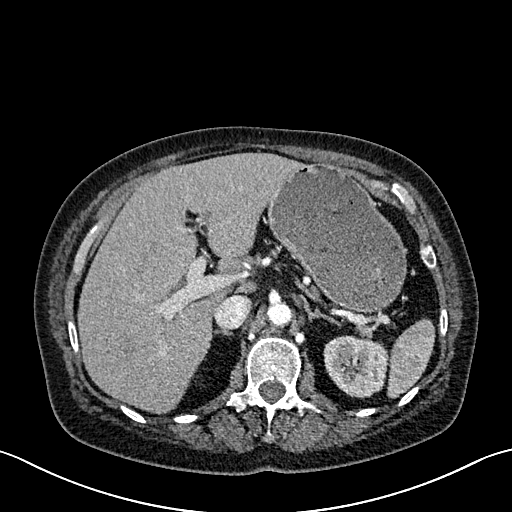
[im 11/153  lung]
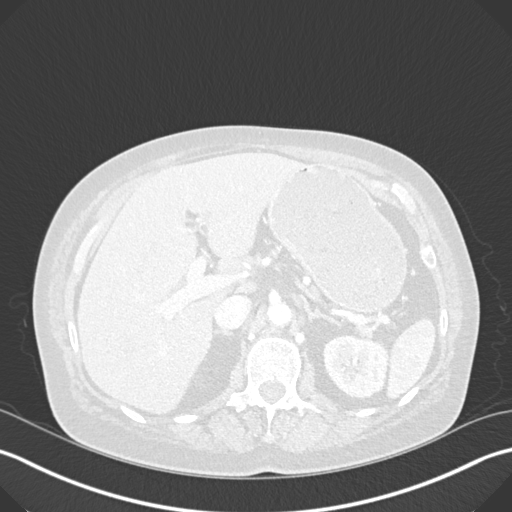
[im 22/153  lung]
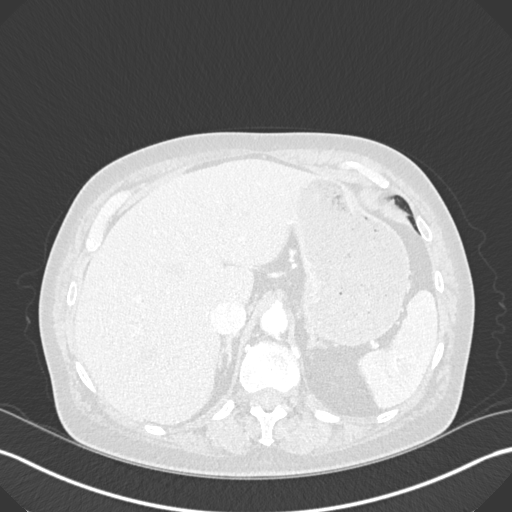
[im 33/153  lung]
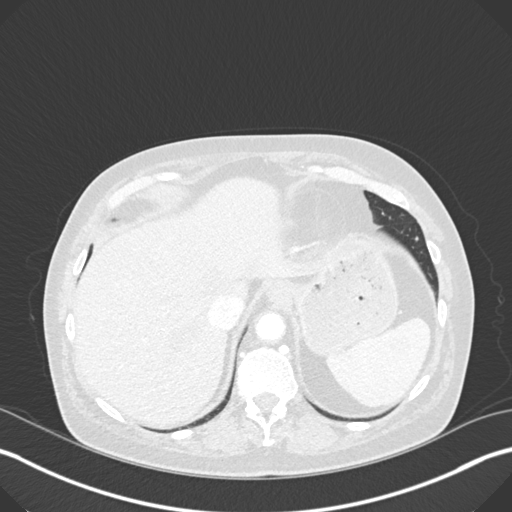
[im 44/153  lung]
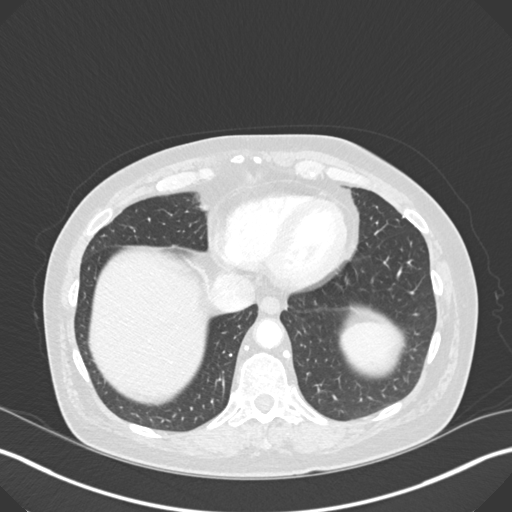
[im 55/153  mediastinal]
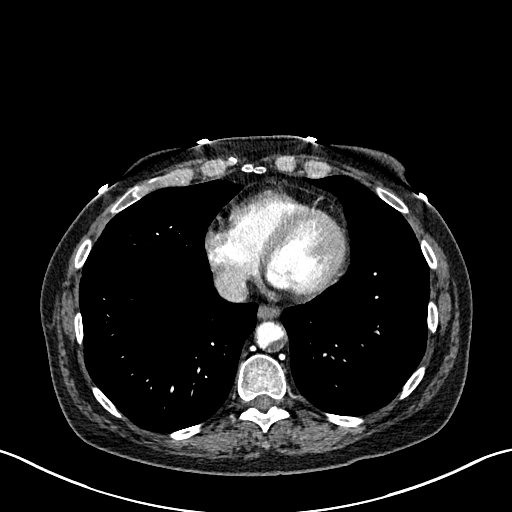
[im 55/153  lung]
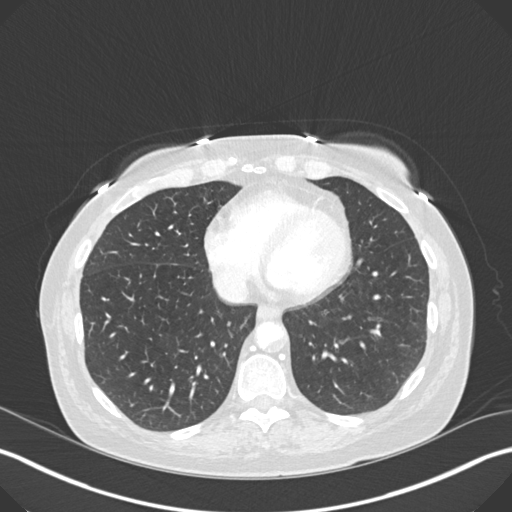
[im 66/153  lung]
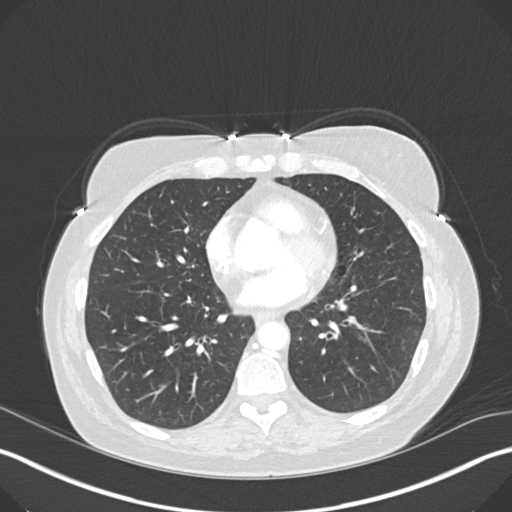
[im 87/153  lung]
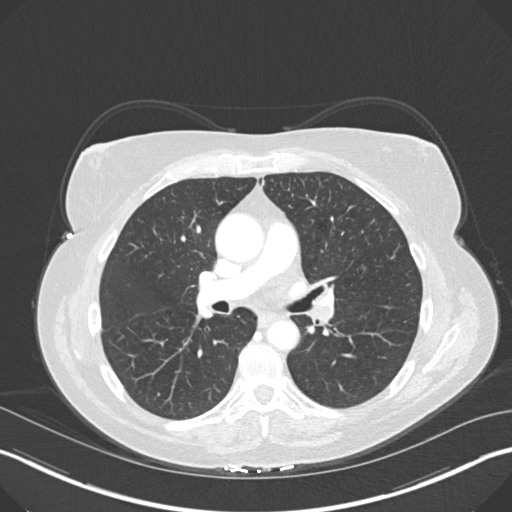
[im 98/153  lung]
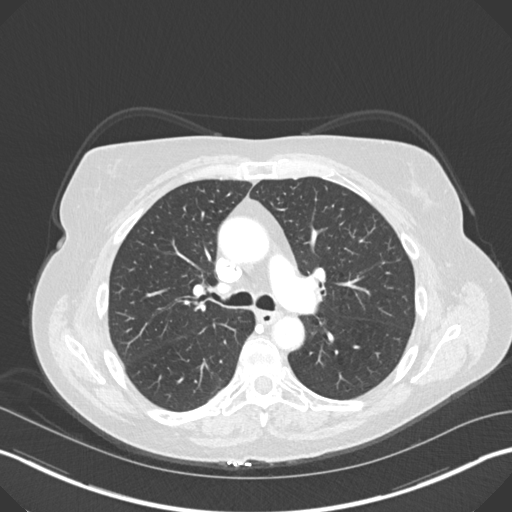
[im 109/153  mediastinal]
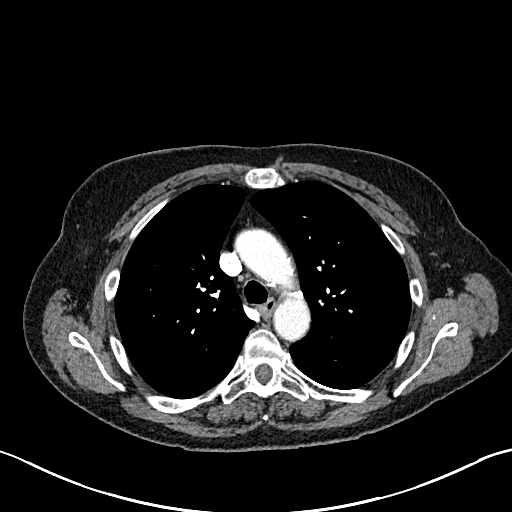
[im 109/153  lung]
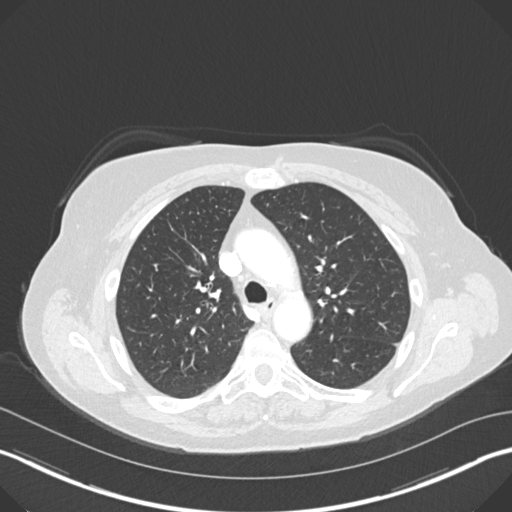
[im 120/153  lung]
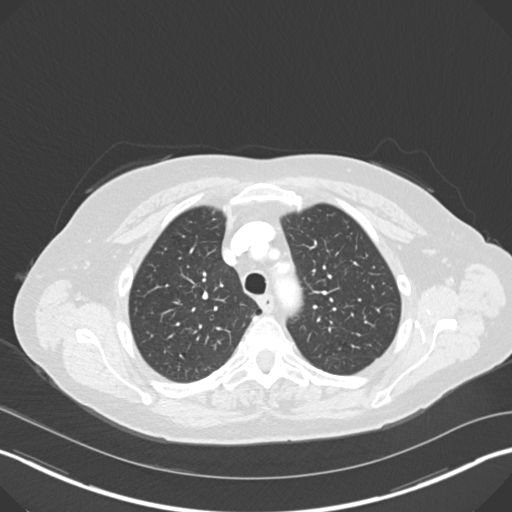
[im 131/153  lung]
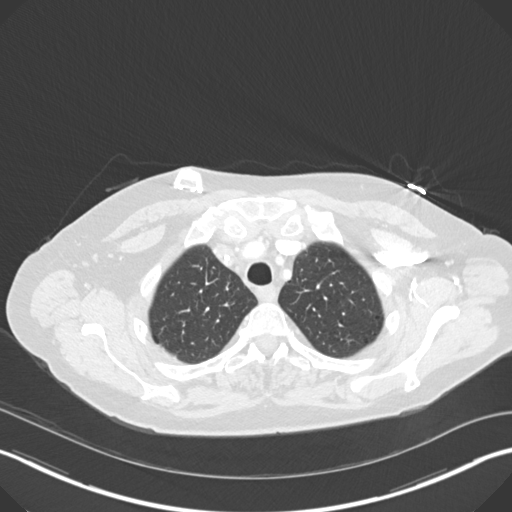
[im 142/153  lung]
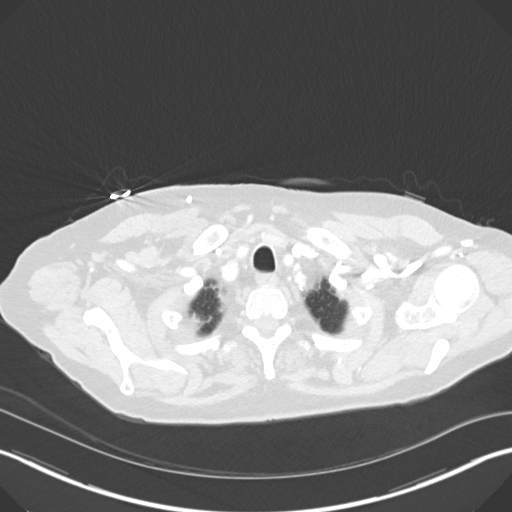

[Series 5: coronal · coronal · 0.68mm/px · 3 of 114 slices shown]
[im 23/114  lung]
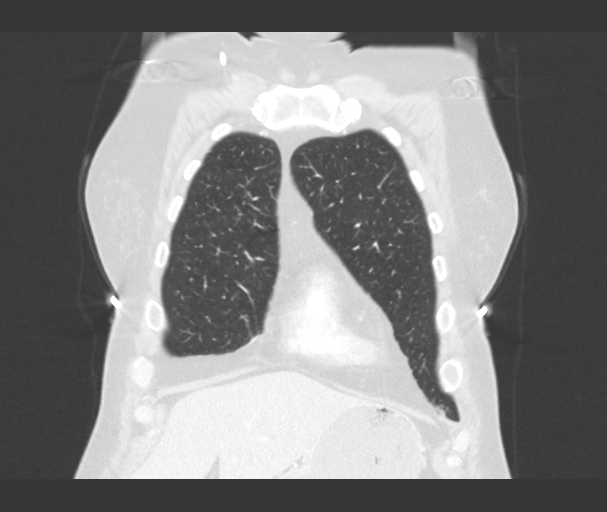
[im 46/114  lung]
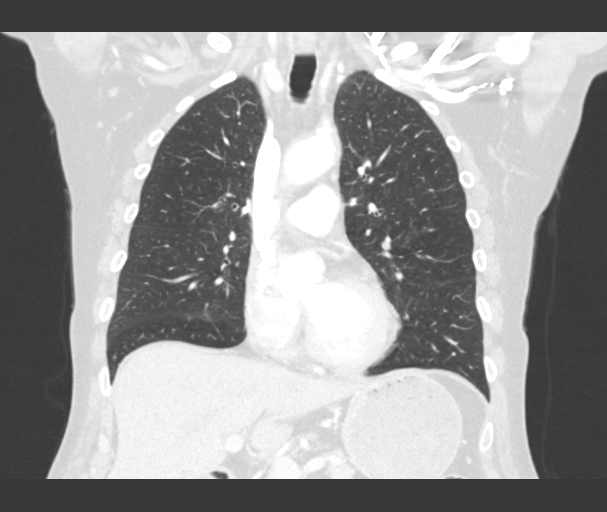
[im 68/114  lung]
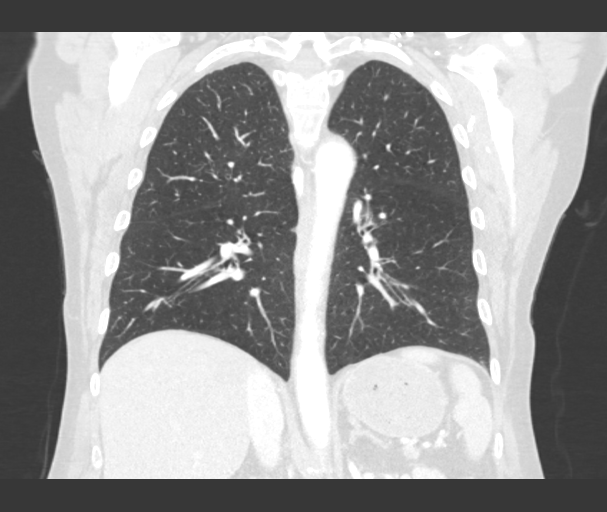

[15 of 36 positions shown; findings below may reference images not displayed]

FINDINGS: Cardiovascular: Normal heart size. No significant pericardial
effusion/thickening. Right internal jugular Port-A-Cath terminates
at the cavoatrial junction. Atherosclerotic nonaneurysmal thoracic
aorta. Normal caliber pulmonary arteries. Incidental acute
left-sided pulmonary embolism involving lobar, segmental and
subsegmental left lower lobe branches.

Mediastinum/Nodes: No discrete thyroid nodules. Unremarkable
esophagus. No pathologically enlarged axillary, mediastinal or hilar
lymph nodes.

Lungs/Pleura: No pneumothorax. No pleural effusion. Mild
centrilobular and paraseptal emphysema with mild diffuse bronchial
wall thickening. Central left upper lobe 0.5 cm solid pulmonary
nodule (series 7/image 66), decreased from 1.0 cm on [DATE]
chest CT. Previously visualized dominant central left upper lobe
cm solid pulmonary nodule on [DATE] chest CT study has resolved.
Posterior left lower lobe 0.5 cm solid pulmonary nodule (series
7/image 90) is decreased from 1.8 cm. No acute consolidative
airspace disease or new significant pulmonary nodules.

Upper abdomen: Cholecystectomy.

Musculoskeletal: No aggressive appearing focal osseous lesions. Mild
thoracic spondylosis.
IMPRESSION: 1. Incidental acute left-sided pulmonary embolism involving lobar,
segmental and subsegmental left lower lobe branches.
2. Interval positive response to therapy. The hypermetabolic central
left upper lobe 2.2 cm solid pulmonary nodule seen on [DATE]
PET-CT study has resolved. Additional smaller left upper lobe and
left lower lobe pulmonary nodules are decreased. No new or
progressive metastatic disease in the chest.
3. Aortic Atherosclerosis ([HI]-[HI]) and Emphysema ([HI]-[HI]).

Critical Value/emergent results were called by telephone at the time
of interpretation on [DATE] at [DATE] to provider VEZU
VEZU , who verbally acknowledged these results.

## 2019-06-24 MED ORDER — XARELTO VTE STARTER PACK 15 & 20 MG PO TBPK: ORAL_TABLET | ORAL | 0 refills | Status: DC

## 2019-06-24 MED ORDER — METHIMAZOLE 10 MG PO TABS: 10.0000 mg | ORAL_TABLET | Freq: Every day | ORAL | 0 refills | Status: DC

## 2019-06-24 MED ORDER — SODIUM CHLORIDE (PF) 0.9 % IJ SOLN
INTRAMUSCULAR | Status: AC
  Filled 2019-06-24: qty 50

## 2019-06-24 MED ORDER — IOHEXOL 300 MG/ML  SOLN
75.0000 mL | Freq: Once | INTRAMUSCULAR | Status: AC | PRN
  Administered 2019-06-24: 75 mL via INTRAVENOUS

## 2019-06-25 ENCOUNTER — Encounter: Payer: Self-pay | Admitting: Internal Medicine

## 2019-06-25 ENCOUNTER — Other Ambulatory Visit: Payer: Self-pay

## 2019-06-25 ENCOUNTER — Inpatient Hospital Stay: Payer: 59

## 2019-06-25 ENCOUNTER — Inpatient Hospital Stay: Payer: 59 | Admitting: Nutrition

## 2019-06-25 ENCOUNTER — Ambulatory Visit: Payer: 59

## 2019-06-25 ENCOUNTER — Inpatient Hospital Stay (HOSPITAL_BASED_OUTPATIENT_CLINIC_OR_DEPARTMENT_OTHER): Payer: 59 | Admitting: Internal Medicine

## 2019-06-25 ENCOUNTER — Other Ambulatory Visit: Payer: Self-pay | Admitting: Internal Medicine

## 2019-06-25 VITALS — BP 103/73 | HR 86 | Temp 98.0°F | Resp 18 | Ht 64.0 in | Wt 145.9 lb

## 2019-06-25 DIAGNOSIS — Z5111 Encounter for antineoplastic chemotherapy: Secondary | ICD-10-CM

## 2019-06-25 DIAGNOSIS — C3492 Malignant neoplasm of unspecified part of left bronchus or lung: Secondary | ICD-10-CM

## 2019-06-25 DIAGNOSIS — C3490 Malignant neoplasm of unspecified part of unspecified bronchus or lung: Secondary | ICD-10-CM | POA: Diagnosis not present

## 2019-06-25 DIAGNOSIS — I2699 Other pulmonary embolism without acute cor pulmonale: Secondary | ICD-10-CM | POA: Insufficient documentation

## 2019-06-25 LAB — CMP (CANCER CENTER ONLY)
ALT: 8 U/L (ref 0–44)
AST: 10 U/L — ABNORMAL LOW (ref 15–41)
Albumin: 3.2 g/dL — ABNORMAL LOW (ref 3.5–5.0)
Alkaline Phosphatase: 110 U/L (ref 38–126)
Anion gap: 10 (ref 5–15)
BUN: 19 mg/dL (ref 8–23)
CO2: 27 mmol/L (ref 22–32)
Calcium: 9.2 mg/dL (ref 8.9–10.3)
Chloride: 103 mmol/L (ref 98–111)
Creatinine: 0.81 mg/dL (ref 0.44–1.00)
GFR, Est AFR Am: >60 mL/min (ref >60)
GFR, Estimated: >60 mL/min (ref >60)
Glucose, Bld: 158 mg/dL — ABNORMAL HIGH (ref 70–99)
Potassium: 4.4 mmol/L (ref 3.5–5.1)
Sodium: 140 mmol/L (ref 135–145)
Total Bilirubin: 0.4 mg/dL (ref 0.3–1.2)
Total Protein: 6.4 g/dL — ABNORMAL LOW (ref 6.5–8.1)

## 2019-06-25 LAB — CBC WITH DIFFERENTIAL (CANCER CENTER ONLY)
Abs Immature Granulocytes: 0.04 10*3/uL (ref 0.00–0.07)
Basophils Absolute: 0 10*3/uL (ref 0.0–0.1)
Basophils Relative: 0 %
Eosinophils Absolute: 0.1 10*3/uL (ref 0.0–0.5)
Eosinophils Relative: 1 %
HCT: 28.9 % — ABNORMAL LOW (ref 36.0–46.0)
Hemoglobin: 9.7 g/dL — ABNORMAL LOW (ref 12.0–15.0)
Immature Granulocytes: 1 %
Lymphocytes Relative: 10 %
Lymphs Abs: 0.8 10*3/uL (ref 0.7–4.0)
MCH: 30.2 pg (ref 26.0–34.0)
MCHC: 33.6 g/dL (ref 30.0–36.0)
MCV: 90 fL (ref 80.0–100.0)
Monocytes Absolute: 0.8 10*3/uL (ref 0.1–1.0)
Monocytes Relative: 10 %
Neutro Abs: 6.3 10*3/uL (ref 1.7–7.7)
Neutrophils Relative %: 78 %
Platelet Count: 216 10*3/uL (ref 150–400)
RBC: 3.21 MIL/uL — ABNORMAL LOW (ref 3.87–5.11)
RDW: 15.3 % (ref 11.5–15.5)
WBC Count: 8 10*3/uL (ref 4.0–10.5)
nRBC: 0 % (ref 0.0–0.2)

## 2019-06-25 LAB — MAGNESIUM: Magnesium: 1.7 mg/dL (ref 1.7–2.4)

## 2019-06-25 MED ORDER — SODIUM CHLORIDE 0.9 % IV SOLN
150.0000 mg | Freq: Once | INTRAVENOUS | Status: AC
  Administered 2019-06-25: 150 mg via INTRAVENOUS
  Filled 2019-06-25: qty 150

## 2019-06-25 MED ORDER — SODIUM CHLORIDE 0.9 % IV SOLN
10.0000 mg | Freq: Once | INTRAVENOUS | Status: AC
  Administered 2019-06-25: 10 mg via INTRAVENOUS
  Filled 2019-06-25: qty 10

## 2019-06-25 MED ORDER — POTASSIUM CHLORIDE 2 MEQ/ML IV SOLN
Freq: Once | INTRAVENOUS | Status: AC
  Filled 2019-06-25: qty 10

## 2019-06-25 MED ORDER — PALONOSETRON HCL INJECTION 0.25 MG/5ML
0.2500 mg | Freq: Once | INTRAVENOUS | Status: AC
  Administered 2019-06-25: 0.25 mg via INTRAVENOUS

## 2019-06-25 MED ORDER — HEPARIN SOD (PORK) LOCK FLUSH 100 UNIT/ML IV SOLN
500.0000 [IU] | Freq: Once | INTRAVENOUS | Status: AC | PRN
  Administered 2019-06-25: 500 [IU]
  Filled 2019-06-25: qty 5

## 2019-06-25 MED ORDER — SODIUM CHLORIDE 0.9 % IV SOLN
Freq: Once | INTRAVENOUS | Status: AC
  Filled 2019-06-25: qty 250

## 2019-06-25 MED ORDER — SODIUM CHLORIDE 0.9 % IV SOLN
80.0000 mg/m2 | Freq: Once | INTRAVENOUS | Status: AC
  Administered 2019-06-25: 142 mg via INTRAVENOUS
  Filled 2019-06-25: qty 142

## 2019-06-25 MED ORDER — PALONOSETRON HCL INJECTION 0.25 MG/5ML
INTRAVENOUS | Status: AC
  Filled 2019-06-25: qty 5

## 2019-06-25 MED ORDER — SODIUM CHLORIDE 0.9% FLUSH
10.0000 mL | INTRAVENOUS | Status: DC | PRN
  Administered 2019-06-25: 10 mL
  Filled 2019-06-25: qty 10

## 2019-06-25 MED ORDER — SODIUM CHLORIDE 0.9 % IV SOLN
100.0000 mg/m2 | Freq: Once | INTRAVENOUS | Status: AC
  Administered 2019-06-25: 180 mg via INTRAVENOUS
  Filled 2019-06-25: qty 9

## 2019-06-25 NOTE — Progress Notes (Signed)
[place] Telephone:([phone]   [phone]  OFFICE PROGRESS NOTE  Christy Alar, NP [place] [address]  DIAGNOSIS: limited stage (T2a, N1, M0) small cell lung cancer presented with right hilar mass in addition to right hilar adenopathy diagnosed in February 2021.  PRIOR THERAPY: None.  CURRENT THERAPY: Systemic chemotherapy with cisplatin 80 mg/M2 on day 1 and etoposide 100 mg/M2 on days 1, 2 and 3 every 3 weeks.  Status post 1 cycle.  This is concurrent with radiotherapy.  INTERVAL HISTORY: Christy Abbott 64 y.o. female with a history of a  limited stage (T2a, N1, M0) small cell lung cancer. She initially presented with right hilar mass in addition to right hilar adenopathy when she was diagnosed in February 2021. She has been treated with systemic chemotherapy with cisplatin 80 mg/M2 on day 1 and etoposide 100 mg/M2 on days 1, 2 and 3 every 3 weeks.  She is status post 2 cycles which have been dosed with concurrent radiotherapy.  She presents to the clinic today after having a restaging CT scan completed earlier today with results as follows:  FINDINGS: Cardiovascular: Normal heart size. No significant pericardial effusion/thickening. Right internal jugular Port-A-Cath terminates at the cavoatrial junction. Atherosclerotic nonaneurysmal thoracic aorta. Normal caliber pulmonary arteries. Incidental acute left-sided pulmonary embolism involving lobar, segmental and subsegmental left lower lobe branches.  Mediastinum/Nodes: No discrete thyroid nodules. Unremarkable esophagus. No pathologically enlarged axillary, mediastinal or hilar lymph nodes.  Lungs/Pleura: No pneumothorax. No pleural effusion. Mild centrilobular and paraseptal emphysema with mild diffuse bronchial wall thickening. Central left upper lobe 0.5 cm solid pulmonary nodule (series 7/image 66), decreased from 1.0 cm on 03/22/2019 chest CT. Previously  visualized dominant central left upper lobe 2.2 cm solid pulmonary nodule on 03/22/2019 chest CT study has resolved. Posterior left lower lobe 0.5 cm solid pulmonary nodule (series 7/image 90) is decreased from 1.8 cm. No acute consolidative airspace disease or new significant pulmonary nodules.  Upper abdomen: Cholecystectomy.  Musculoskeletal: No aggressive appearing focal osseous lesions. Mild thoracic spondylosis.  IMPRESSION: 1. Incidental acute left-sided pulmonary embolism involving lobar, segmental and subsegmental left lower lobe branches. 2. Interval positive response to therapy. The hypermetabolic central left upper lobe 2.2 cm solid pulmonary nodule seen on 04/22/2019 PET-CT study has resolved. Additional smaller left upper lobe and left lower lobe pulmonary nodules are decreased. No new or progressive metastatic disease in the chest. 3. Aortic Atherosclerosis (ICD10-I70.0) and Emphysema (ICD10-J43.9).  She was begun on Xarelto given her left lower lobe pulmonary emboli.    MEDICAL HISTORY: Past Medical History:  Diagnosis Date  . Arthritis   . Atrial flutter [facility])    s/p CTI by Dr Lovena Le 02/2013  . Chest pain   . Diabetes mellitus without complication ([place])   . Dysrhythmia    hx of Aflutter  . 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 Hemochromatosis associated with compound heterozygous mutation in HFE gene ([place]) 12/10/2017  . Hyperlipidemia   . Hypertension   . Hyperthyroidism 10/31/2014    ALLERGIES:  is allergic to jardiance [empagliflozin] and trazodone and nefazodone.  MEDICATIONS:  Current Outpatient Medications  Medication Sig Dispense Refill  . albuterol (VENTOLIN HFA) 108 (90 Base) MCG/ACT inhaler Inhale 2 puffs into the lungs every 6 (six) hours as needed for wheezing or shortness of breath. 6.7 g 0  . Bioflavonoid Products (ESTER C PO) Take 1 tablet by mouth daily.    Marland Kitchen  Calcium Carb-Cholecalciferol (CALTRATE 600+D3 PO) Take 1 tablet by  mouth daily.    . carvedilol (COREG) 12.5 MG tablet Take 1 tablet (12.5 mg total) by mouth in the morning and at bedtime. 180 tablet 3  . Continuous Blood Gluc Sensor (FREESTYLE LIBRE 2 SENSOR) MISC 2 Devices by Does not apply route every 14 (fourteen) days. 2 each 3  . diphenhydramine-acetaminophen (TYLENOL PM) 25-500 MG TABS tablet Take 2 tablets by mouth at bedtime.    . flecainide (TAMBOCOR) 50 MG tablet TAKE 1 TABLET BY MOUTH TWICE DAILY, MAY TAKE AN ADDITIONAL 1 TABLET AS NEEDED. Please keep upcoming appt in January. Thank you (Patient taking differently: Take 50 mg by mouth as directed. Take 50 mg by mouth twice daily, may take a third 50 mg dose as needed for palpitations) 270 tablet 0  . furosemide (LASIX) 20 MG tablet Take 1 tablet (20 mg total) by mouth daily as needed. (Patient taking differently: Take 20 mg by mouth daily as needed. fluid) 60 tablet 0  . guaiFENesin-dextromethorphan (ROBITUSSIN DM) 100-10 MG/5ML syrup Take 5 mLs by mouth every 4 (four) hours as needed for cough. 118 mL 0  . insulin aspart (NOVOLOG) 100 UNIT/ML FlexPen Inject subcutaneously 3 times daily prior to meals per sliding scale 15 mL 1  . insulin glargine (LANTUS) 100 unit/mL SOPN Inject 0.1 mLs (10 Units total) into the skin daily. (Patient taking differently: Inject 10 Units into the skin at bedtime. ) 15 mL 2  . Insulin Pen Needle (B-D UF III MINI PEN NEEDLES) 31G X 5 MM MISC Use with insulin pen 100 each 3  . Lancets 30G MISC Check sugar twice daily. 100 each 3  . lidocaine-prilocaine (EMLA) cream Apply 1 application topically as needed. Using a cotton ball , apply a small amount of cream to the skin over the porta cath site. Do not rub in the cream. Cover with Plastic wrap. 30 g 0  . lisinopril-hydrochlorothiazide (PRINZIDE,ZESTORETIC) 20-25 MG tablet Take 1 tablet by mouth daily. 90 tablet 1  . magnesium oxide (MAG-OX) 400 (241.3 Mg) MG tablet Take 1 tablet (400 mg total) by mouth 3 (three) times daily. 90  tablet 0  . metFORMIN (GLUCOPHAGE) 500 MG tablet TAKE 2 TABLETS BY MOUTH TWICE DAILY . APPOINTMENT REQUIRED FOR FUTURE REFILLS 120 tablet 0  . methimazole (TAPAZOLE) 10 MG tablet Take 1 tablet (10 mg total) by mouth daily. 90 tablet 0  . Omega-3 Fatty Acids (FISH OIL) 1200 MG CAPS Take 1,200 mg by mouth daily.     . ondansetron (ZOFRAN) 4 MG tablet Take 1 tablet (4 mg total) by mouth every 8 (eight) hours as needed for nausea or vomiting. 20 tablet 0  . potassium chloride SA (KLOR-CON) 20 MEQ tablet TAKE 1  BY MOUTH ONCE DAILY (Patient taking differently: Take 20 mEq by mouth daily. ) 90 tablet 0  . prochlorperazine (COMPAZINE) 10 MG tablet Take 1 tablet (10 mg total) by mouth every 6 (six) hours as needed for nausea or vomiting. 30 tablet 0  . Rivaroxaban Stater Pack, 15 mg and 20 mg, (XARELTO STARTER PACK) Follow package directions: Take one 15mg  tablet by mouth twice a day. On day 22, switch to one 20mg  tablet once a day. Take with food. 51 each 0  . simvastatin (ZOCOR) 10 MG tablet TAKE 1 TABLET BY MOUTH AT BEDTIME 90 tablet 1  . Tetrahydrozoline HCl (VISINE OP) Place 1 drop into both eyes daily as needed (irritation).     No  current facility-administered medications for this visit.    SURGICAL HISTORY:  Past Surgical History:  Procedure Laterality Date  . ABLATION  03-04-2013   CTI by Dr Lovena Le for atrial flutter  . ATRIAL FLUTTER ABLATION N/A 03/04/2013   Procedure: ATRIAL FLUTTER ABLATION;  Surgeon: Evans Lance, MD;  Location[facility] CATH LAB;  Service: Cardiovascular;  Laterality: N/A;  . CHOLECYSTECTOMY  1982  . COLONOSCOPY W/ POLYPECTOMY     x 2  . ESOPHAGOGASTRODUODENOSCOPY N/A 05/20/2014   Procedure: ESOPHAGOGASTRODUODENOSCOPY (EGD);  Surgeon: Teena Irani, MD;  Location: [facility] END[facility];  Service: Endoscopy;  Laterality: N/A;  . IR IMAGING GUIDED PORT INSERTION  05/09/2019  . TONSILLECTOMY  1972 ?  Marland Kitchen VIDEO BRONCHOSCOPY WITH ENDOBRONCHIAL ULTRASOUND Left 04/30/2019   Procedure: VIDEO  BRONCHOSCOPY WITH ENDOBRONCHIAL ULTRASOUND;  Surgeon: Garner Nash, DO;  Location: [place];  Service: Pulmonary;  Laterality: Left;    REVIEW OF SYSTEMS: The patient is review of systems was negative.  PHYSICAL EXAMINATION:  HEENT: Normocephalic and atraumatic Neuro: The patient is alert and oriented.  The patient is ambulating without difficulty.  Normal coordination.  The patient is speaking with full sentences. Psych: The patient is intermittently tearful regarding her CT scan results.  There are no other issues of concern. Skin: No erythema or skin lesions noted.  ECOG PERFORMANCE STATUS: 1 - Symptomatic but completely ambulatory  Blood pressure 138/84, pulse 92, temperature 98.2 F (36.8 C), temperature source Temporal, resp. rate 18, height 5\' 4"  (1.626 m), weight 146 lb 9.6 oz (66.5 kg), SpO2 99 %.  LABORATORY DATA: Lab Results  Component Value Date   WBC 8.0 06/25/2019   HGB 9.7 (L) 06/25/2019   HCT 28.9 (L) 06/25/2019   MCV 90.0 06/25/2019   PLT 216 06/25/2019      Chemistry      Component Value Date/Time   NA 140 06/25/2019 0857   NA 140 02/21/2018 0955   K 4.4 06/25/2019 0857   CL 103 06/25/2019 0857   CO2 27 06/25/2019 0857   BUN 19 06/25/2019 0857   BUN 14 02/21/2018 0955   CREATININE 0.81 06/25/2019 0857   CREATININE 0.82 01/22/2015 1556      Component Value Date/Time   CALCIUM 9.2 06/25/2019 0857   ALKPHOS 110 06/25/2019 0857   AST 10 (L) 06/25/2019 0857   ALT 8 06/25/2019 0857   BILITOT 0.4 06/25/2019 0857       RADIOGRAPHIC STUDIES: CT Chest W Contrast  Result Date: 06/24/2019 CLINICAL DATA:  Limited stage small cell left upper lobe carcinoma diagnosed 04/30/2019 with ongoing chemotherapy and radiation therapy. Restaging. EXAM: CT CHEST WITH CONTRAST TECHNIQUE: Multidetector CT imaging of the chest was performed during intravenous contrast administration. CONTRAST:  12mL OMNIPAQUE IOHEXOL 300 MG/ML  SOLN COMPARISON:  04/22/2019 PET-CT. 03/22/2019  chest CT angiogram. FINDINGS: Cardiovascular: Normal heart size. No significant pericardial effusion/thickening. Right internal jugular Port-A-Cath terminates at the cavoatrial junction. Atherosclerotic nonaneurysmal thoracic aorta. Normal caliber pulmonary arteries. Incidental acute left-sided pulmonary embolism involving lobar, segmental and subsegmental left lower lobe branches. Mediastinum/Nodes: No discrete thyroid nodules. Unremarkable esophagus. No pathologically enlarged axillary, mediastinal or hilar lymph nodes. Lungs/Pleura: No pneumothorax. No pleural effusion. Mild centrilobular and paraseptal emphysema with mild diffuse bronchial wall thickening. Central left upper lobe 0.5 cm solid pulmonary nodule (series 7/image 66), decreased from 1.0 cm on 03/22/2019 chest CT. Previously visualized dominant central left upper lobe 2.2 cm solid pulmonary nodule on 03/22/2019 chest CT study has resolved. Posterior left lower lobe 0.5  cm solid pulmonary nodule (series 7/image 90) is decreased from 1.8 cm. No acute consolidative airspace disease or new significant pulmonary nodules. Upper abdomen: Cholecystectomy. Musculoskeletal: No aggressive appearing focal osseous lesions. Mild thoracic spondylosis. IMPRESSION: 1. Incidental acute left-sided pulmonary embolism involving lobar, segmental and subsegmental left lower lobe branches. 2. Interval positive response to therapy. The hypermetabolic central left upper lobe 2.2 cm solid pulmonary nodule seen on 04/22/2019 PET-CT study has resolved. Additional smaller left upper lobe and left lower lobe pulmonary nodules are decreased. No new or progressive metastatic disease in the chest. 3. Aortic Atherosclerosis (ICD10-I70.0) and Emphysema (ICD10-J43.9). Critical Value/emergent results were called by telephone at the time of interpretation on 06/24/2019 at 2:21 pm to provider [facility] , who verbally acknowledged these results. Electronically Signed   By: Ilona Sorrel  M.D.   On: 06/24/2019 14:27    ASSESSMENT AND PLAN:  1. Limited stage (T2a, N1, M0) small cell lung cancer. She has been treated with systemic chemotherapy with cisplatin 80 mg/M2 on day 1 and etoposide 100 mg/M2 on days 1, 2 and 3 every 3 weeks.  She is status post 2 cycles which have been dosed with concurrent radiotherapy.  Restaging CT scan completed earlier today with results as follows:  Lungs/Pleura: No pneumothorax. No pleural effusion. Mild centrilobular and paraseptal emphysema with mild diffuse bronchial wall thickening. Central left upper lobe 0.5 cm solid pulmonary nodule (series 7/image 66), decreased from 1.0 cm on 03/22/2019 chest CT. Previously visualized dominant central left upper lobe 2.2 cm solid pulmonary nodule on 03/22/2019 chest CT study has resolved. Posterior left lower lobe 0.5 cm solid pulmonary nodule (series 7/image 90) is decreased from 1.8 cm. No acute consolidative airspace disease or new significant pulmonary nodules.  2.  Pulmonary emboli: The patient's CT scan from earlier today shows that acute pulmonary emboli the lobar, segmental, and subsegmental branches of the left lower lobe.  The patient was started on a Xarelto starter pack.  She was told to contact the office when she begins her fourth week of therapy for an ongoing prescription for Xarelto 20 mg once daily.  Potential side effects (increased risk of GI bleeding) and drug interactions (Motrin and NSAIDs) was discussed with the patient.  She expresses understanding of these potential side effects.  Sandi Mealy, MHS, PA-C Physician Assistant  This case was discussed with Dr. Julien Nordmann. He expressed agreement with my management of this patient.

## 2019-06-25 NOTE — Patient Instructions (Signed)
[facility] Discharge Instructions for Patients Receiving Chemotherapy  Today you received the following chemotherapy agents Etoposide; Cisplatin  To help prevent nausea and vomiting after your treatment, we encourage you to take your nausea medication as directed   If you develop nausea and vomiting that is not controlled by your nausea medication, call the clinic.   BELOW ARE SYMPTOMS THAT SHOULD BE REPORTED IMMEDIATELY:  *FEVER GREATER THAN 100.5 F  *CHILLS WITH OR WITHOUT FEVER  NAUSEA AND VOMITING THAT IS NOT CONTROLLED WITH YOUR NAUSEA MEDICATION  *UNUSUAL SHORTNESS OF BREATH  *UNUSUAL BRUISING OR BLEEDING  TENDERNESS IN MOUTH AND THROAT WITH OR WITHOUT PRESENCE OF ULCERS  *URINARY PROBLEMS  *BOWEL PROBLEMS  UNUSUAL RASH Items with * indicate a potential emergency and should be followed up as soon as possible.  Feel free to call the clinic should you have any questions or concerns. The clinic phone number is [phone].  Please show the [place] at check-in to the Emergency Department and triage nurse.  Etoposide, VP-16 injection What is this medicine? ETOPOSIDE, VP-16 (e toe POE side) is a chemotherapy drug. It is used to treat testicular cancer, lung cancer, and other cancers. This medicine may be used for other purposes; ask your health care provider or pharmacist if you have questions. COMMON BRAND NAME(S): Etopophos, Toposar, VePesid What should I tell my health care provider before I take this medicine? They need to know if you have any of these conditions:  infection  kidney disease  liver disease  low blood counts, like low white cell, platelet, or red cell counts  an unusual or allergic reaction to etoposide, other medicines, foods, dyes, or preservatives  pregnant or trying to get pregnant  breast-feeding How should I use this medicine? This medicine is for infusion into a vein. It is administered in a hospital or  clinic by a specially trained health care professional. Talk to your pediatrician regarding the use of this medicine in children. Special care may be needed. Overdosage: If you think you have taken too much of this medicine contact a poison control center or emergency room at once. NOTE: This medicine is only for you. Do not share this medicine with others. What if I miss a dose? It is important not to miss your dose. Call your doctor or health care professional if you are unable to keep an appointment. What may interact with this medicine? This medicine may interact with the following medications:  warfarin This list may not describe all possible interactions. Give your health care provider a list of all the medicines, herbs, non-prescription drugs, or dietary supplements you use. Also tell them if you smoke, drink alcohol, or use illegal drugs. Some items may interact with your medicine. What should I watch for while using this medicine? Visit your doctor for checks on your progress. This drug may make you feel generally unwell. This is not uncommon, as chemotherapy can affect healthy cells as well as cancer cells. Report any side effects. Continue your course of treatment even though you feel ill unless your doctor tells you to stop. In some cases, you may be given additional medicines to help with side effects. Follow all directions for their use. Call your doctor or health care professional for advice if you get a fever, chills or sore throat, or other symptoms of a cold or flu. Do not treat yourself. This drug decreases your body's ability to fight infections. Try to avoid being around people  who are sick. This medicine may increase your risk to bruise or bleed. Call your doctor or health care professional if you notice any unusual bleeding. Talk to your doctor about your risk of cancer. You may be more at risk for certain types of cancers if you take this medicine. Do not become pregnant  while taking this medicine or for at least 6 months after stopping it. Women should inform their doctor if they wish to become pregnant or think they might be pregnant. Women of child-bearing potential will need to have a negative pregnancy test before starting this medicine. There is a potential for serious side effects to an unborn child. Talk to your health care professional or pharmacist for more information. Do not breast-feed an infant while taking this medicine. Men must use a latex condom during sexual contact with a woman while taking this medicine and for at least 4 months after stopping it. A latex condom is needed even if you have had a vasectomy. Contact your doctor right away if your partner becomes pregnant. Do not donate sperm while taking this medicine and for at least 4 months after you stop taking this medicine. Men should inform their doctors if they wish to father a child. This medicine may lower sperm counts. What side effects may I notice from receiving this medicine? Side effects that you should report to your doctor or health care professional as soon as possible:  allergic reactions like skin rash, itching or hives, swelling of the face, lips, or tongue  low blood counts - this medicine may decrease the number of white blood cells, red blood cells, and platelets. You may be at increased risk for infections and bleeding  nausea, vomiting  redness, blistering, peeling or loosening of the skin, including inside the mouth  signs and symptoms of infection like fever; chills; cough; sore throat; pain or trouble passing urine  signs and symptoms of low red blood cells or anemia such as unusually weak or tired; feeling faint or lightheaded; falls; breathing problems  unusual bruising or bleeding Side effects that usually do not require medical attention (report to your doctor or health care professional if they continue or are bothersome):  changes in taste  diarrhea  hair  loss  loss of appetite  mouth sores This list may not describe all possible side effects. Call your doctor for medical advice about side effects. You may report side effects to FDA at 1-800-FDA-1088. Where should I keep my medicine? This drug is given in a hospital or clinic and will not be stored at home. NOTE: This sheet is a summary. It may not cover all possible information. If you have questions about this medicine, talk to your doctor, pharmacist, or health care provider.  2020 [employer]/Gold Standard (2018-04-24 16:57:15)  Cisplatin injection What is this medicine? CISPLATIN (SIS pla tin) is a chemotherapy drug. It targets fast dividing cells, like cancer cells, and causes these cells to die. This medicine is used to treat many types of cancer like bladder, ovarian, and testicular cancers. This medicine may be used for other purposes; ask your health care provider or pharmacist if you have questions. COMMON BRAND NAME(S): Platinol, Platinol -AQ What should I tell my health care provider before I take this medicine? They need to know if you have any of these conditions:  eye disease, vision problems  hearing problems  kidney disease  low blood counts, like white cells, platelets, or red blood cells  tingling of the fingers  or toes, or other nerve disorder  an unusual or allergic reaction to cisplatin, carboplatin, oxaliplatin, other medicines, foods, dyes, or preservatives  pregnant or trying to get pregnant  breast-feeding How should I use this medicine? This drug is given as an infusion into a vein. It is administered in a hospital or clinic by a specially trained health care professional. Talk to your pediatrician regarding the use of this medicine in children. Special care may be needed. Overdosage: If you think you have taken too much of this medicine contact a poison control center or emergency room at once. NOTE: This medicine is only for you. Do not share this  medicine with others. What if I miss a dose? It is important not to miss a dose. Call your doctor or health care professional if you are unable to keep an appointment. What may interact with this medicine? This medicine may interact with the following medications:  foscarnet  certain antibiotics like amikacin, gentamicin, neomycin, polymyxin B, streptomycin, tobramycin, vancomycin This list may not describe all possible interactions. Give your health care provider a list of all the medicines, herbs, non-prescription drugs, or dietary supplements you use. Also tell them if you smoke, drink alcohol, or use illegal drugs. Some items may interact with your medicine. What should I watch for while using this medicine? Your condition will be monitored carefully while you are receiving this medicine. You will need important blood work done while you are taking this medicine. This drug may make you feel generally unwell. This is not uncommon, as chemotherapy can affect healthy cells as well as cancer cells. Report any side effects. Continue your course of treatment even though you feel ill unless your doctor tells you to stop. This medicine may increase your risk of getting an infection. Call your healthcare professional for advice if you get a fever, chills, or sore throat, or other symptoms of a cold or flu. Do not treat yourself. Try to avoid being around people who are sick. Avoid taking medicines that contain aspirin, acetaminophen, ibuprofen, naproxen, or ketoprofen unless instructed by your healthcare professional. These medicines may hide a fever. This medicine may increase your risk to bruise or bleed. Call your doctor or health care professional if you notice any unusual bleeding. Be careful brushing and flossing your teeth or using a toothpick because you may get an infection or bleed more easily. If you have any dental work done, tell your dentist you are receiving this medicine. Do not become  pregnant while taking this medicine or for 14 months after stopping it. Women should inform their healthcare professional if they wish to become pregnant or think they might be pregnant. Men should not father a child while taking this medicine and for 11 months after stopping it. There is potential for serious side effects to an unborn child. Talk to your healthcare professional for more information. Do not breast-feed an infant while taking this medicine. This medicine has caused ovarian failure in some women. This medicine may make it more difficult to get pregnant. Talk to your healthcare professional if you are concerned about your fertility. This medicine has caused decreased sperm counts in some men. This may make it more difficult to father a child. Talk to your healthcare professional if you are concerned about your fertility. Drink fluids as directed while you are taking this medicine. This will help protect your kidneys. Call your doctor or health care professional if you get diarrhea. Do not treat yourself. What side  effects may I notice from receiving this medicine? Side effects that you should report to your doctor or health care professional as soon as possible:  allergic reactions like skin rash, itching or hives, swelling of the face, lips, or tongue  blurred vision  changes in vision  decreased hearing or ringing of the ears  nausea, vomiting  pain, redness, or irritation at site where injected  pain, tingling, numbness in the hands or feet  signs and symptoms of bleeding such as bloody or black, tarry stools; red or dark brown urine; spitting up blood or brown material that looks like coffee grounds; red spots on the skin; unusual bruising or bleeding from the eyes, gums, or nose  signs and symptoms of infection like fever; chills; cough; sore throat; pain or trouble passing urine  signs and symptoms of kidney injury like trouble passing urine or change in the amount of  urine  signs and symptoms of low red blood cells or anemia such as unusually weak or tired; feeling faint or lightheaded; falls; breathing problems Side effects that usually do not require medical attention (report to your doctor or health care professional if they continue or are bothersome):  loss of appetite  mouth sores  muscle cramps This list may not describe all possible side effects. Call your doctor for medical advice about side effects. You may report side effects to FDA at 1-800-FDA-1088. Where should I keep my medicine? This drug is given in a hospital or clinic and will not be stored at home. NOTE: This sheet is a summary. It may not cover all possible information. If you have questions about this medicine, talk to your doctor, pharmacist, or health care provider.  2020 [employer]vier/Gold Standard (2018-02-22 15:59:17)

## 2019-06-25 NOTE — Progress Notes (Signed)
Patient was identified to be at risk for malnutrition on the MST secondary to poor appetite and weight loss.  I met with patient during infusion who reports her appetite is good and she is hungry all of the time.  Reports weight loss is due to hyperthyroidism.  States MD has changed her medication.  She has no nutrition concerns.  Provided contact information in case patient has questions or concerns in the future.

## 2019-06-25 NOTE — Progress Notes (Signed)
[place] [phone]   Fax:(336) [phone]  OFFICE PROGRESS NOTE  [name], NP [place] [address]  DIAGNOSIS:  1) limited stage (T2a, N1, M0) small cell lung cancer presented with right hilar mass in addition to right hilar adenopathy diagnosed in February 2021. 2) incidental acute left-sided pulmonary embolism involving lobar, segmental and subsegmental left lower lobe branches diagnosed in April 2021.  PRIOR THERAPY: None.  CURRENT THERAPY:  1) Systemic chemotherapy with cisplatin 80 mg/M2 on day 1 and etoposide 100 mg/M2 on days 1, 2 and 3 every 3 weeks.  Status post 2 cycles.  This is concurrent with radiotherapy. 2) Xarelto 15 mg p.o. twice daily for the first 3 weeks followed by 20 mg p.o. daily.  Started June 24 2019.  INTERVAL HISTORY: Christy Abbott 64 y.o. female returns to the clinic today for follow-up visit.  The patient is feeling fine today with no concerning complaints.  She completed the course of concurrent radiotherapy.  She denied having any current chest pain, shortness of breath except with exertion with no cough or hemoptysis.  She denied having any nausea, vomiting, diarrhea or constipation.  She has no significant weight loss or night sweats.  She has no headache or visual changes.  She has been tolerating her systemic chemotherapy fairly well.  The patient had repeat CT scan of the chest performed yesterday and she is here for evaluation and discussion of her risk her results and treatment options.   MEDICAL HISTORY: Past Medical History:  Diagnosis Date  . Arthritis   . Atrial flutter [facility])    s/p CTI by Dr Lovena Le 02/2013  . Chest pain   . Diabetes mellitus without complication ([place])   . Dysrhythmia    hx of Aflutter  . 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 Hemochromatosis associated with compound heterozygous mutation in HFE gene ([place]) 12/10/2017  .  Hyperlipidemia   . Hypertension   . Hyperthyroidism 10/31/2014    ALLERGIES:  is allergic to jardiance [empagliflozin] and trazodone and nefazodone.  MEDICATIONS:  Current Outpatient Medications  Medication Sig Dispense Refill  . albuterol (VENTOLIN HFA) 108 (90 Base) MCG/ACT inhaler Inhale 2 puffs into the lungs every 6 (six) hours as needed for wheezing or shortness of breath. 6.7 g 0  . Bioflavonoid Products (ESTER C PO) Take 1 tablet by mouth daily.    . Calcium Carb-Cholecalciferol (CALTRATE 600+D3 PO) Take 1 tablet by mouth daily.    . carvedilol (COREG) 12.5 MG tablet Take 1 tablet (12.5 mg total) by mouth in the morning and at bedtime. 180 tablet 3  . Continuous Blood Gluc Sensor (FREESTYLE LIBRE 2 SENSOR) MISC 2 Devices by Does not apply route every 14 (fourteen) days. 2 each 3  . diphenhydramine-acetaminophen (TYLENOL PM) 25-500 MG TABS tablet Take 2 tablets by mouth at bedtime.    . flecainide (TAMBOCOR) 50 MG tablet TAKE 1 TABLET BY MOUTH TWICE DAILY, MAY TAKE AN ADDITIONAL 1 TABLET AS NEEDED. Please keep upcoming appt in January. Thank you (Patient taking differently: Take 50 mg by mouth as directed. Take 50 mg by mouth twice daily, may take a third 50 mg dose as needed for palpitations) 270 tablet 0  . furosemide (LASIX) 20 MG tablet Take 1 tablet (20 mg total) by mouth daily as needed. (Patient taking differently: Take 20 mg by mouth daily as needed. fluid) 60 tablet 0  .  glimepiride (AMARYL) 4 MG tablet Take 1 tablet (4 mg total) by mouth daily with breakfast. 90 tablet 1  . guaiFENesin-dextromethorphan (ROBITUSSIN DM) 100-10 MG/5ML syrup Take 5 mLs by mouth every 4 (four) hours as needed for cough. 118 mL 0  . insulin aspart (NOVOLOG) 100 UNIT/ML FlexPen Inject subcutaneously 3 times daily prior to meals per sliding scale 15 mL 1  . insulin glargine (LANTUS) 100 unit/mL SOPN Inject 0.1 mLs (10 Units total) into the skin daily. (Patient taking differently: Inject 10 Units into the  skin at bedtime. ) 15 mL 2  . Insulin Pen Needle (B-D UF III MINI PEN NEEDLES) 31G X 5 MM MISC Use with insulin pen 100 each 3  . Lancets 30G MISC Check sugar twice daily. 100 each 3  . lidocaine-prilocaine (EMLA) cream Apply 1 application topically as needed. Using a cotton ball , apply a small amount of cream to the skin over the porta cath site. Do not rub in the cream. Cover with Plastic wrap. 30 g 0  . lisinopril-hydrochlorothiazide (PRINZIDE,ZESTORETIC) 20-25 MG tablet Take 1 tablet by mouth daily. 90 tablet 1  . magnesium oxide (MAG-OX) 400 (241.3 Mg) MG tablet Take 1 tablet (400 mg total) by mouth 3 (three) times daily. 90 tablet 0  . metFORMIN (GLUCOPHAGE) 500 MG tablet TAKE 2 TABLETS BY MOUTH TWICE DAILY . APPOINTMENT REQUIRED FOR FUTURE REFILLS 120 tablet 0  . methimazole (TAPAZOLE) 10 MG tablet Take 1 tablet (10 mg total) by mouth daily. 90 tablet 0  . Omega-3 Fatty Acids (FISH OIL) 1200 MG CAPS Take 1,200 mg by mouth daily.     . ondansetron (ZOFRAN) 4 MG tablet Take 1 tablet (4 mg total) by mouth every 8 (eight) hours as needed for nausea or vomiting. 20 tablet 0  . oxycodone (OXY-IR) 5 MG capsule 1 to 2 caps Q 4 hours prn pain 30 capsule 0  . potassium chloride SA (KLOR-CON) 20 MEQ tablet TAKE 1  BY MOUTH ONCE DAILY (Patient taking differently: Take 20 mEq by mouth daily. ) 90 tablet 0  . prochlorperazine (COMPAZINE) 10 MG tablet Take 1 tablet (10 mg total) by mouth every 6 (six) hours as needed for nausea or vomiting. 30 tablet 0  . Rivaroxaban Stater Pack, 15 mg and 20 mg, (XARELTO STARTER PACK) Follow package directions: Take one 15mg  tablet by mouth twice a day. On day 22, switch to one 20mg  tablet once a day. Take with food. 51 each 0  . simvastatin (ZOCOR) 10 MG tablet TAKE 1 TABLET BY MOUTH AT BEDTIME 90 tablet 1  . sucralfate (CARAFATE) 1 g tablet Take 1 tablet (1 g total) by mouth 4 (four) times daily -  with meals and at bedtime. Crush and dissolve in 10 mL of warm water then  swallow 120 tablet 1  . Tetrahydrozoline HCl (VISINE OP) Place 1 drop into both eyes daily as needed (irritation).     No current facility-administered medications for this visit.    SURGICAL HISTORY:  Past Surgical History:  Procedure Laterality Date  . ABLATION  03-04-2013   CTI by Dr Lovena Le for atrial flutter  . ATRIAL FLUTTER ABLATION N/A 03/04/2013   Procedure: ATRIAL FLUTTER ABLATION;  Surgeon: Evans Lance, MD;  Location: [facility] C[facility] LAB;  Service: Cardiovascular;  Laterality: N/A;  . CHOLECYSTECTOMY  1982  . COLONOSCOPY W/ POLYPECTOMY     x 2  . ESOPHAGOGASTRODUODENOSCOPY N/A 05/20/2014   Procedure: ESOPHAGOGASTRODUODENOSCOPY (EGD);  Surgeon: Teena Irani, MD;  Location:  [facility] ENDOSCOPY;  Service: Endoscopy;  Laterality: N/A;  . IR IMAGING GUIDED PORT INSERTION  05/09/2019  . TONSILLECTOMY  1972 ?  Marland Kitchen VIDEO BRONCHOSCOPY WITH ENDOBRONCHIAL ULTRASOUND Left 04/30/2019   Procedure: VIDEO BRONCHOSCOPY WITH ENDOBRONCHIAL ULTRASOUND;  Surgeon: Garner Nash, DO;  Location: [facility];  Service: Pulmonary;  Laterality: Left;    REVIEW OF SYSTEMS:  Constitutional: positive for fatigue Eyes: negative Ears, nose, mouth, throat, and face: negative Respiratory: positive for dyspnea on exertion Cardiovascular: negative Gastrointestinal: negative Genitourinary:negative Integument/breast: negative Hematologic/lymphatic: negative Musculoskeletal:negative Neurological: negative Behavioral/Psych: negative Endocrine: negative Allergic/Immunologic: negative   PHYSICAL EXAMINATION: General appearance: alert, cooperative, fatigued and no distress Head: Normocephalic, without obvious abnormality, atraumatic Neck: no adenopathy, no JVD, supple, symmetrical, trachea midline and thyroid not enlarged, symmetric, no tenderness/mass/nodules Lymph nodes: Cervical, supraclavicular, and axillary nodes normal. Resp: clear to auscultation bilaterally Back: symmetric, no curvature. ROM normal. No CVA  tenderness. Cardio: regular rate and rhythm, S1, S2 normal, no murmur, click, rub or gallop GI: soft, non-tender; bowel sounds normal; no masses,  no organomegaly Extremities: extremities normal, atraumatic, no cyanosis or edema Neurologic: Alert and oriented X 3, normal strength and tone. Normal symmetric reflexes. Normal coordination and gait  ECOG PERFORMANCE STATUS: 1 - Symptomatic but completely ambulatory  Blood pressure 103/73, pulse 86, temperature 98 F (36.7 C), temperature source Oral, resp. rate 18, height 5\' 4"  (1.626 m), weight 145 lb 14.4 oz (66.2 kg), SpO2 98 %.  LABORATORY DATA: Lab Results  Component Value Date   WBC 8.0 06/25/2019   HGB 9.7 (L) 06/25/2019   HCT 28.9 (L) 06/25/2019   MCV 90.0 06/25/2019   PLT 216 06/25/2019      Chemistry      Component Value Date/Time   NA 144 06/17/2019 1018   NA 140 02/21/2018 0955   K 3.7 06/17/2019 1018   CL 102 06/17/2019 1018   CO2 31 06/17/2019 1018   BUN 14 06/17/2019 1018   BUN 14 02/21/2018 0955   CREATININE 0.80 06/17/2019 1018   CREATININE 0.82 01/22/2015 1556      Component Value Date/Time   CALCIUM 9.2 06/17/2019 1018   ALKPHOS 134 (H) 06/17/2019 1018   AST 9 (L) 06/17/2019 1018   ALT 8 06/17/2019 1018   BILITOT 0.3 06/17/2019 1018       RADIOGRAPHIC STUDIES: CT Chest W Contrast  Result Date: 06/24/2019 CLINICAL DATA:  Limited stage small cell left upper lobe carcinoma diagnosed 04/30/2019 with ongoing chemotherapy and radiation therapy. Restaging. EXAM: CT CHEST WITH CONTRAST TECHNIQUE: Multidetector CT imaging of the chest was performed during intravenous contrast administration. CONTRAST:  45mL OMNIPAQUE IOHEXOL 300 MG/ML  SOLN COMPARISON:  04/22/2019 PET-CT. 03/22/2019 chest CT angiogram. FINDINGS: Cardiovascular: Normal heart size. No significant pericardial effusion/thickening. Right internal jugular Port-A-Cath terminates at the cavoatrial junction. Atherosclerotic nonaneurysmal thoracic aorta.  Normal caliber pulmonary arteries. Incidental acute left-sided pulmonary embolism involving lobar, segmental and subsegmental left lower lobe branches. Mediastinum/Nodes: No discrete thyroid nodules. Unremarkable esophagus. No pathologically enlarged axillary, mediastinal or hilar lymph nodes. Lungs/Pleura: No pneumothorax. No pleural effusion. Mild centrilobular and paraseptal emphysema with mild diffuse bronchial wall thickening. Central left upper lobe 0.5 cm solid pulmonary nodule (series 7/image 66), decreased from 1.0 cm on 03/22/2019 chest CT. Previously visualized dominant central left upper lobe 2.2 cm solid pulmonary nodule on 03/22/2019 chest CT study has resolved. Posterior left lower lobe 0.5 cm solid pulmonary nodule (series 7/image 90) is decreased from 1.8 cm. No acute consolidative airspace disease or  new significant pulmonary nodules. Upper abdomen: Cholecystectomy. Musculoskeletal: No aggressive appearing focal osseous lesions. Mild thoracic spondylosis. IMPRESSION: 1. Incidental acute left-sided pulmonary embolism involving lobar, segmental and subsegmental left lower lobe branches. 2. Interval positive response to therapy. The hypermetabolic central left upper lobe 2.2 cm solid pulmonary nodule seen on 04/22/2019 PET-CT study has resolved. Additional smaller left upper lobe and left lower lobe pulmonary nodules are decreased. No new or progressive metastatic disease in the chest. 3. Aortic Atherosclerosis (ICD10-I70.0) and Emphysema (ICD10-J43.9). Critical Value/emergent results were called by telephone at the time of interpretation on 06/24/2019 at 2:21 pm to provider [facility] , who verbally acknowledged these results. Electronically Signed   By: Ilona Sorrel M.D.   On: 06/24/2019 14:27    ASSESSMENT AND PLAN: This is a very pleasant 64 years old white female with limited stage small cell lung cancer and currently undergoing systemic chemotherapy with cisplatin and etoposide concurrent  with radiation status post 2 cycles. The patient has been tolerating her treatment well with no concerning adverse effects. She had repeat CT scan of the chest performed yesterday.  I personally and independently reviewed the scan images and discussed the results with the patient today. Her scan showed significant improvement in her disease. I recommended for her to continue her current treatment with systemic chemotherapy with cisplatin and etoposide and she will proceed with cycle #3 today. For the recently diagnosed incidental pulmonary embolism of the left lower lobe pulmonary arteries, we started the patient on Xarelto 15 mg p.o. twice daily for 3 weeks followed by 20 mg p.o. daily. The patient will come back for follow-up visit in 3 weeks for evaluation before starting cycle #4 of her treatment. She was advised to call immediately if she has any concerning symptoms in the interval. The patient voices understanding of current disease status and treatment options and is in agreement with the current care plan.  All questions were answered. The patient knows to call the clinic with any problems, questions or concerns. We can certainly see the patient much sooner if necessary.   Disclaimer: This note was dictated with voice recognition software. Similar sounding words can inadvertently be transcribed and may not be corrected upon review.

## 2019-06-26 ENCOUNTER — Other Ambulatory Visit: Payer: Self-pay

## 2019-06-26 ENCOUNTER — Inpatient Hospital Stay: Payer: 59

## 2019-06-26 VITALS — BP 146/93 | HR 92 | Temp 97.8°F | Resp 16

## 2019-06-26 DIAGNOSIS — Z5111 Encounter for antineoplastic chemotherapy: Secondary | ICD-10-CM | POA: Diagnosis not present

## 2019-06-26 DIAGNOSIS — C3492 Malignant neoplasm of unspecified part of left bronchus or lung: Secondary | ICD-10-CM

## 2019-06-26 MED ORDER — SODIUM CHLORIDE 0.9 % IV SOLN
Freq: Once | INTRAVENOUS | Status: AC
  Filled 2019-06-26: qty 250

## 2019-06-26 MED ORDER — SODIUM CHLORIDE 0.9 % IV SOLN
100.0000 mg/m2 | Freq: Once | INTRAVENOUS | Status: AC
  Administered 2019-06-26: 180 mg via INTRAVENOUS
  Filled 2019-06-26: qty 9

## 2019-06-26 MED ORDER — SODIUM CHLORIDE 0.9% FLUSH
10.0000 mL | INTRAVENOUS | Status: DC | PRN
  Administered 2019-06-26: 10 mL
  Filled 2019-06-26: qty 10

## 2019-06-26 MED ORDER — HEPARIN SOD (PORK) LOCK FLUSH 100 UNIT/ML IV SOLN
500.0000 [IU] | Freq: Once | INTRAVENOUS | Status: AC | PRN
  Administered 2019-06-26: 500 [IU]
  Filled 2019-06-26: qty 5

## 2019-06-26 MED ORDER — SODIUM CHLORIDE 0.9 % IV SOLN
10.0000 mg | Freq: Once | INTRAVENOUS | Status: AC
  Administered 2019-06-26: 10 mg via INTRAVENOUS
  Filled 2019-06-26: qty 10

## 2019-06-26 NOTE — Patient Instructions (Addendum)
[place] Discharge Instructions for Patients Receiving Chemotherapy  Today you received the following chemotherapy agent: Etoposide  To help prevent nausea and vomiting after your treatment, we encourage you to take your nausea medication as directed by your MD.   If you develop nausea and vomiting that is not controlled by your nausea medication, call the clinic.   BELOW ARE SYMPTOMS THAT SHOULD BE REPORTED IMMEDIATELY:  *FEVER GREATER THAN 100.5 F  *CHILLS WITH OR WITHOUT FEVER  NAUSEA AND VOMITING THAT IS NOT CONTROLLED WITH YOUR NAUSEA MEDICATION  *UNUSUAL SHORTNESS OF BREATH  *UNUSUAL BRUISING OR BLEEDING  TENDERNESS IN MOUTH AND THROAT WITH OR WITHOUT PRESENCE OF ULCERS  *URINARY PROBLEMS  *BOWEL PROBLEMS  UNUSUAL RASH Items with * indicate a potential emergency and should be followed up as soon as possible.  Feel free to call the clinic should you have any questions or concerns. The clinic phone number is [phone].  Please show the [place] at check-in to the Emergency Department and triage nurse.  Coronavirus (COVID-19) Are you at risk?  Are you at risk for the Coronavirus (COVID-19)?  To be considered HIGH RISK for Coronavirus (COVID-19), you have to meet the following criteria:  . Traveled to [place], [place], [place], [place] or [place]; or in the [place] to [place], [place], [place], or [place]; and have fever, cough, and shortness of breath within the last 2 weeks of travel OR . Been in close contact with a person diagnosed with COVID-19 within the last 2 weeks and have fever, cough, and shortness of breath . IF YOU DO NOT MEET THESE CRITERIA, YOU ARE CONSIDERED LOW RISK FOR COVID-19.  What to do if you are HIGH RISK for COVID-19?  Marland Kitchen If you are having a medical emergency, call 911. . Seek medical care right away. Before you go to a doctor's office, urgent care or emergency department, call ahead and tell them about  your recent travel, contact with someone diagnosed with COVID-19, and your symptoms. You should receive instructions from your physician's office regarding next steps of care.  . When you arrive at healthcare provider, tell the healthcare staff immediately you have returned from visiting [place], [place], [place], [place] or [place]; or traveled in the [place] to [place], [place], [place], or [place]; in the last two weeks or you have been in close contact with a person diagnosed with COVID-19 in the last 2 weeks.   . Tell the health care staff about your symptoms: fever, cough and shortness of breath. . After you have been seen by a medical provider, you will be either: o Tested for (COVID-19) and discharged home on quarantine except to seek medical care if symptoms worsen, and asked to  - Stay home and avoid contact with others until you get your results (4-5 days)  - Avoid travel on public transportation if possible (such as bus, train, or airplane) or o Sent to the Emergency Department by EMS for evaluation, COVID-19 testing, and possible admission depending on your condition and test results.  What to do if you are LOW RISK for COVID-19?  Reduce your risk of any infection by using the same precautions used for avoiding the common cold or flu:  Marland Kitchen Wash your hands often with soap and warm water for at least 20 seconds.  If soap and water are not readily available, use an alcohol-based hand sanitizer with at least 60% alcohol.  . If  coughing or sneezing, cover your mouth and nose by coughing or sneezing into the elbow areas of your shirt or coat, into a tissue or into your sleeve (not your hands). . Avoid shaking hands with others and consider head nods or verbal greetings only. . Avoid touching your eyes, nose, or mouth with unwashed hands.  . Avoid close contact with people who are sick. . Avoid places or events with large numbers of people in one location, like concerts or sporting  events. . Carefully consider travel plans you have or are making. . If you are planning any travel outside or inside the [place], visit the CDC's Travelers' Health webpage for the latest health notices. . If you have some symptoms but not all symptoms, continue to monitor at home and seek medical attention if your symptoms worsen. . If you are having a medical emergency, call 911.   [place] / e-Visit: [email or web address]         [facility]: [facility]: [phone]                   [facility]: [phone]

## 2019-06-27 ENCOUNTER — Inpatient Hospital Stay: Payer: 59

## 2019-06-27 ENCOUNTER — Other Ambulatory Visit: Payer: Self-pay

## 2019-06-27 VITALS — BP 154/87 | HR 77 | Temp 98.3°F | Resp 18

## 2019-06-27 DIAGNOSIS — Z5111 Encounter for antineoplastic chemotherapy: Secondary | ICD-10-CM | POA: Diagnosis not present

## 2019-06-27 DIAGNOSIS — C3492 Malignant neoplasm of unspecified part of left bronchus or lung: Secondary | ICD-10-CM

## 2019-06-27 MED ORDER — SODIUM CHLORIDE 0.9 % IV SOLN
10.0000 mg | Freq: Once | INTRAVENOUS | Status: AC
  Administered 2019-06-27: 09:00:00 10 mg via INTRAVENOUS
  Filled 2019-06-27: qty 10

## 2019-06-27 MED ORDER — SODIUM CHLORIDE 0.9 % IV SOLN
Freq: Once | INTRAVENOUS | Status: AC
  Filled 2019-06-27: qty 250

## 2019-06-27 MED ORDER — HEPARIN SOD (PORK) LOCK FLUSH 100 UNIT/ML IV SOLN
500.0000 [IU] | Freq: Once | INTRAVENOUS | Status: AC | PRN
  Administered 2019-06-27: 500 [IU]
  Filled 2019-06-27: qty 5

## 2019-06-27 MED ORDER — SODIUM CHLORIDE 0.9% FLUSH
10.0000 mL | INTRAVENOUS | Status: DC | PRN
  Administered 2019-06-27: 10 mL
  Filled 2019-06-27: qty 10

## 2019-06-27 MED ORDER — SODIUM CHLORIDE 0.9 % IV SOLN
100.0000 mg/m2 | Freq: Once | INTRAVENOUS | Status: AC
  Administered 2019-06-27: 10:00:00 180 mg via INTRAVENOUS
  Filled 2019-06-27: qty 9

## 2019-06-27 NOTE — Patient Instructions (Signed)
[place] Discharge Instructions for Patients Receiving Chemotherapy  Today you received the following chemotherapy agents Etoposide  To help prevent nausea and vomiting after your treatment, we encourage you to take your nausea medication as directed.    If you develop nausea and vomiting that is not controlled by your nausea medication, call the clinic.   BELOW ARE SYMPTOMS THAT SHOULD BE REPORTED IMMEDIATELY:  *FEVER GREATER THAN 100.5 F  *CHILLS WITH OR WITHOUT FEVER  NAUSEA AND VOMITING THAT IS NOT CONTROLLED WITH YOUR NAUSEA MEDICATION  *UNUSUAL SHORTNESS OF BREATH  *UNUSUAL BRUISING OR BLEEDING  TENDERNESS IN MOUTH AND THROAT WITH OR WITHOUT PRESENCE OF ULCERS  *URINARY PROBLEMS  *BOWEL PROBLEMS  UNUSUAL RASH Items with * indicate a potential emergency and should be followed up as soon as possible.  Feel free to call the clinic should you have any questions or concerns. The clinic phone number is [phone].  Please show the [place] at check-in to the Emergency Department and triage nurse.

## 2019-06-29 ENCOUNTER — Telehealth: Payer: Self-pay | Admitting: Internal Medicine
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
## 2019-06-29 NOTE — Telephone Encounter (Signed)
Scheduled per los. Called and left msg. Mailed printout  °

## 2019-06-30 ENCOUNTER — Other Ambulatory Visit: Payer: Self-pay

## 2019-06-30 ENCOUNTER — Inpatient Hospital Stay: Payer: 59

## 2019-06-30 ENCOUNTER — Ambulatory Visit: Payer: 59 | Admitting: Interventional Cardiology

## 2019-06-30 ENCOUNTER — Encounter: Payer: Self-pay | Admitting: Interventional Cardiology

## 2019-06-30 VITALS — BP 134/62 | HR 81 | Ht 64.0 in | Wt 140.0 lb

## 2019-06-30 VITALS — BP 148/68 | HR 72 | Temp 98.2°F | Resp 18

## 2019-06-30 DIAGNOSIS — I491 Atrial premature depolarization: Secondary | ICD-10-CM

## 2019-06-30 DIAGNOSIS — Z5111 Encounter for antineoplastic chemotherapy: Secondary | ICD-10-CM | POA: Diagnosis not present

## 2019-06-30 DIAGNOSIS — I483 Typical atrial flutter: Secondary | ICD-10-CM

## 2019-06-30 DIAGNOSIS — E0590 Thyrotoxicosis, unspecified without thyrotoxic crisis or storm: Secondary | ICD-10-CM

## 2019-06-30 DIAGNOSIS — E119 Type 2 diabetes mellitus without complications: Secondary | ICD-10-CM

## 2019-06-30 DIAGNOSIS — F17200 Nicotine dependence, unspecified, uncomplicated: Secondary | ICD-10-CM

## 2019-06-30 DIAGNOSIS — I493 Ventricular premature depolarization: Secondary | ICD-10-CM | POA: Diagnosis not present

## 2019-06-30 DIAGNOSIS — C3492 Malignant neoplasm of unspecified part of left bronchus or lung: Secondary | ICD-10-CM

## 2019-06-30 MED ORDER — PEGFILGRASTIM-JMDB 6 MG/0.6ML ~~LOC~~ SOSY
6.0000 mg | PREFILLED_SYRINGE | Freq: Once | SUBCUTANEOUS | Status: AC
  Administered 2019-06-30: 6 mg via SUBCUTANEOUS

## 2019-06-30 MED ORDER — PEGFILGRASTIM-JMDB 6 MG/0.6ML ~~LOC~~ SOSY
PREFILLED_SYRINGE | SUBCUTANEOUS | Status: AC
  Filled 2019-06-30: qty 0.6

## 2019-06-30 NOTE — Patient Instructions (Signed)
Tbo-Filgrastim injection What is this medicine? TBO-FILGRASTIM (T B O fil GRA stim) is a granulocyte colony-stimulating factor that helps you make more neutrophils, a type of white blood cell. Neutrophils are important for fighting infections. Some chemotherapy affects your bone marrow and lowers your neutrophils. This medicine helps decrease the length of time that neutrophils are very low (severe neutropenia). This medicine may be used for other purposes; ask your health care provider or pharmacist if you have questions. COMMON BRAND NAME(S): Granix What should I tell my health care provider before I take this medicine? They need to know if you have any of these conditions:  bone scan or tests planned  kidney disease  sickle cell anemia  an unusual or allergic reaction to tbo-filgrastim, filgrastim, pegfilgrastim, other medicines, foods, dyes, or preservatives  pregnant or trying to get pregnant  breast-feeding How should I use this medicine? This medicine is for injection under the skin. If you get this medicine at home, you will be taught how to prepare and give this medicine. Refer to the Instructions for Use that come with your medication packaging. Use exactly as directed. Take your medicine at regular intervals. Do not take your medicine more often than directed. It is important that you put your used needles and syringes in a special sharps container. Do not put them in a trash can. If you do not have a sharps container, call your pharmacist or healthcare provider to get one. Talk to your pediatrician regarding the use of this medicine in children. While this drug may be prescribed for children as young as 76 month of age for selected conditions, precautions do apply. Overdosage: If you think you have taken too much of this medicine contact a poison control center or emergency room at once. NOTE: This medicine is only for you. Do not share this medicine with others. What if I miss a  dose? It is important not to miss your dose. Call your doctor or health care professional if you miss a dose. What may interact with this medicine? This medicine may interact with the following medications:  medicines that may cause a release of neutrophils, such as lithium This list may not describe all possible interactions. Give your health care provider a list of all the medicines, herbs, non-prescription drugs, or dietary supplements you use. Also tell them if you smoke, drink alcohol, or use illegal drugs. Some items may interact with your medicine. What should I watch for while using this medicine? You may need blood work done while you are taking this medicine. What side effects may I notice from receiving this medicine? Side effects that you should report to your doctor or health care professional as soon as possible:  allergic reactions like skin rash, itching or hives, swelling of the face, lips, or tongue  back pain  blood in the urine  dark urine  dizziness  fast heartbeat  feeling faint  shortness of breath or breathing problems  signs and symptoms of infection like fever or chills; cough; or sore throat  signs and symptoms of kidney injury like trouble passing urine or change in the amount of urine  stomach or side pain, or pain at the shoulder  sweating  swelling of the legs, ankles, or abdomen  tiredness Side effects that usually do not require medical attention (report to your doctor or health care professional if they continue or are bothersome):  bone pain  diarrhea  headache  muscle pain  vomiting This list may  not describe all possible side effects. Call your doctor for medical advice about side effects. You may report side effects to FDA at 1-800-FDA-1088. Where should I keep my medicine? Keep out of the reach of children. Store in a refrigerator between 2 and 8 degrees C (36 and 46 degrees F). Keep in carton to protect from light. Throw  away this medicine if it is left out of the refrigerator for more than 5 consecutive days. Throw away any unused medicine after the expiration date. NOTE: This sheet is a summary. It may not cover all possible information. If you have questions about this medicine, talk to your doctor, pharmacist, or health care provider.  2020 Elsevier/Gold Standard (2017-12-29 19:58:39)

## 2019-06-30 NOTE — Patient Instructions (Signed)
Medication Instructions:  Your physician recommends that you continue on your current medications as directed. Please refer to the Current Medication list given to you today.  *If you need a refill on your cardiac medications before your next appointment, please call your pharmacy*   Lab Work: Fasting LIPIDS to be checked at the cancer center  If you have labs (blood work) drawn today and your tests are completely normal, you will receive your results only by: Marland Kitchen MyChart Message (if you have MyChart) OR . A paper copy in the mail If you have any lab test that is abnormal or we need to change your treatment, we will call you to review the results.   Testing/Procedures: None ordered   Follow-Up: At [facility], you and your health needs are our priority.  As part of our continuing mission to provide you with exceptional heart care, we have created designated Provider Care Teams.  These Care Teams include your primary Cardiologist (physician) and Advanced Practice Providers (APPs -  Physician Assistants and Nurse Practitioners) who all work together to provide you with the care you need, when you need it.  We recommend signing up for the patient portal called "MyChart".  Sign up information is provided on this After Visit Summary.  MyChart is used to connect with patients for Virtual Visits (Telemedicine).  Patients are able to view lab/test results, encounter notes, upcoming appointments, etc.  Non-urgent messages can be sent to your provider as well.   To learn more about what you can do with MyChart, go to [email or web address].    Your next appointment:   12 month(s)  The format for your next appointment:   In Person  Provider:   You may see Larae Grooms, MD or one of the following Advanced Practice Providers on your designated Care Team:    Melina Copa, PA-C  Ermalinda Barrios, PA-C    Other Instructions  Stay hydrated  Drink low sugar Boost or Ensure

## 2019-07-01 ENCOUNTER — Telehealth: Payer: Self-pay | Admitting: *Deleted

## 2019-07-01 NOTE — Telephone Encounter (Signed)
Patient left VM with concerns about her current symptoms of N/V, weakness, diarrhea (2-3 times per day) since Saturday (06/28/19). RN returned phone call and patient explained she has not been able to keep any foods, fluids, or medications down since Saturday and has not been out of bed due to weakness and fatigue.   RN spoke to [employer] about symptoms and patient is to be seen tomorrow morning post lab only appointment.  High priority scheduling message sent. Patient updated.

## 2019-07-02 ENCOUNTER — Inpatient Hospital Stay (HOSPITAL_BASED_OUTPATIENT_CLINIC_OR_DEPARTMENT_OTHER): Payer: 59 | Admitting: Medical

## 2019-07-02 ENCOUNTER — Inpatient Hospital Stay: Payer: 59

## 2019-07-02 ENCOUNTER — Other Ambulatory Visit: Payer: Self-pay | Admitting: Emergency Medicine

## 2019-07-02 ENCOUNTER — Other Ambulatory Visit: Payer: Self-pay

## 2019-07-02 VITALS — BP 121/63 | HR 88 | Temp 98.2°F | Resp 18 | Ht 64.0 in | Wt 139.0 lb

## 2019-07-02 DIAGNOSIS — Z95828 Presence of other vascular implants and grafts: Secondary | ICD-10-CM

## 2019-07-02 DIAGNOSIS — C3492 Malignant neoplasm of unspecified part of left bronchus or lung: Secondary | ICD-10-CM

## 2019-07-02 DIAGNOSIS — C3490 Malignant neoplasm of unspecified part of unspecified bronchus or lung: Secondary | ICD-10-CM

## 2019-07-02 DIAGNOSIS — Z5111 Encounter for antineoplastic chemotherapy: Secondary | ICD-10-CM | POA: Diagnosis not present

## 2019-07-02 DIAGNOSIS — R112 Nausea with vomiting, unspecified: Secondary | ICD-10-CM | POA: Diagnosis not present

## 2019-07-02 LAB — CMP (CANCER CENTER ONLY)
ALT: 12 U/L (ref 0–44)
AST: 10 U/L — ABNORMAL LOW (ref 15–41)
Albumin: 3.5 g/dL (ref 3.5–5.0)
Alkaline Phosphatase: 124 U/L (ref 38–126)
Anion gap: 14 (ref 5–15)
BUN: 30 mg/dL — ABNORMAL HIGH (ref 8–23)
CO2: 29 mmol/L (ref 22–32)
Calcium: 9.4 mg/dL (ref 8.9–10.3)
Chloride: 98 mmol/L (ref 98–111)
Creatinine: 0.92 mg/dL (ref 0.44–1.00)
GFR, Est AFR Am: >60 mL/min (ref >60)
GFR, Estimated: >60 mL/min (ref >60)
Glucose, Bld: 192 mg/dL — ABNORMAL HIGH (ref 70–99)
Potassium: 3.4 mmol/L — ABNORMAL LOW (ref 3.5–5.1)
Sodium: 141 mmol/L (ref 135–145)
Total Bilirubin: 0.6 mg/dL (ref 0.3–1.2)
Total Protein: 6.6 g/dL (ref 6.5–8.1)

## 2019-07-02 LAB — CBC WITH DIFFERENTIAL (CANCER CENTER ONLY)
Abs Immature Granulocytes: 0.1 K/uL — ABNORMAL HIGH (ref 0.00–0.07)
Band Neutrophils: 4 %
Basophils Absolute: 0.1 K/uL (ref 0.0–0.1)
Basophils Relative: 1 %
Eosinophils Absolute: 0 K/uL (ref 0.0–0.5)
Eosinophils Relative: 0 %
HCT: 30.1 % — ABNORMAL LOW (ref 36.0–46.0)
Hemoglobin: 10 g/dL — ABNORMAL LOW (ref 12.0–15.0)
Lymphocytes Relative: 4 %
Lymphs Abs: 0.5 K/uL — ABNORMAL LOW (ref 0.7–4.0)
MCH: 30.2 pg (ref 26.0–34.0)
MCHC: 33.2 g/dL (ref 30.0–36.0)
MCV: 90.9 fL (ref 80.0–100.0)
Metamyelocytes Relative: 1 %
Monocytes Absolute: 0 K/uL — ABNORMAL LOW (ref 0.1–1.0)
Monocytes Relative: 0 %
Neutro Abs: 11.1 K/uL — ABNORMAL HIGH (ref 1.7–7.7)
Neutrophils Relative %: 90 %
Platelet Count: 142 K/uL — ABNORMAL LOW (ref 150–400)
RBC: 3.31 MIL/uL — ABNORMAL LOW (ref 3.87–5.11)
RDW: 15.8 % — ABNORMAL HIGH (ref 11.5–15.5)
WBC Count: 11.8 K/uL — ABNORMAL HIGH (ref 4.0–10.5)
nRBC: 0 % (ref 0.0–0.2)

## 2019-07-02 LAB — MAGNESIUM: Magnesium: 1.7 mg/dL (ref 1.7–2.4)

## 2019-07-02 MED ORDER — ONDANSETRON HCL 4 MG/2ML IJ SOLN
8.0000 mg | Freq: Once | INTRAMUSCULAR | Status: AC
  Administered 2019-07-02: 8 mg via INTRAVENOUS

## 2019-07-02 MED ORDER — SODIUM CHLORIDE 0.9 % IV SOLN
Freq: Once | INTRAVENOUS | Status: AC
  Filled 2019-07-02: qty 250

## 2019-07-02 MED ORDER — ONDANSETRON HCL 4 MG/2ML IJ SOLN
INTRAMUSCULAR | Status: AC
  Filled 2019-07-02: qty 4

## 2019-07-02 MED ORDER — SODIUM CHLORIDE 0.9% FLUSH
10.0000 mL | Freq: Once | INTRAVENOUS | Status: AC
  Administered 2019-07-02: 10 mL
  Filled 2019-07-02: qty 10

## 2019-07-02 MED ORDER — LORAZEPAM 0.5 MG PO TABS: 0.5000 mg | ORAL_TABLET | Freq: Three times a day (TID) | ORAL | 0 refills | Status: DC | PRN

## 2019-07-02 MED ORDER — HEPARIN SOD (PORK) LOCK FLUSH 100 UNIT/ML IV SOLN
500.0000 [IU] | Freq: Once | INTRAVENOUS | Status: AC
  Administered 2019-07-02: 500 [IU]
  Filled 2019-07-02: qty 5

## 2019-07-02 NOTE — Patient Instructions (Signed)
Dehydration, Adult Dehydration is a condition in which there is not enough water or other fluids in the body. This happens when a person loses more fluids than he or she takes in. Important organs, such as the kidneys, brain, and heart, cannot function without a proper amount of fluids. Any loss of fluids from the body can lead to dehydration. Dehydration can be mild, moderate, or severe. It should be treated right away to prevent it from becoming severe. What are the causes? Dehydration may be caused by:  Conditions that cause loss of water or other fluids, such as diarrhea, vomiting, or sweating or urinating a lot.  Not drinking enough fluids, especially when you are ill or doing activities that require a lot of energy.  Other illnesses and conditions, such as fever or infection.  Certain medicines, such as medicines that remove excess fluid from the body (diuretics).  Lack of safe drinking water.  Not being able to get enough water and food. What increases the risk? The following factors may make you more likely to develop this condition:  Having a long-term (chronic) illness that has not been treated properly, such as diabetes, heart disease, or kidney disease.  Being 28 years of age or older.  Having a disability.  Living in a place that is high in altitude, where thinner, drier air causes more fluid loss.  Doing exercises that put stress on your body for a long time (endurance sports). What are the signs or symptoms? Symptoms of dehydration depend on how severe it is. Mild or moderate dehydration  Thirst.  Dry lips or dry mouth.  Dizziness or light-headedness, especially when standing up from a seated position.  Muscle cramps.  Dark urine. Urine may be the color of tea.  Less urine or tears produced than usual.  Headache. Severe dehydration  Changes in skin. Your skin may be cold and clammy, blotchy, or pale. Your skin also may not return to normal after being  lightly pinched and released.  Little or no tears, urine, or sweat.  Changes in vital signs, such as rapid breathing and low blood pressure. Your pulse may be weak or may be faster than 100 beats a minute when you are sitting still.  Other changes, such as: ? Feeling very thirsty. ? Sunken eyes. ? Cold hands and feet. ? Confusion. ? Being very tired (lethargic) or having trouble waking from sleep. ? Short-term weight loss. ? Loss of consciousness. How is this diagnosed? This condition is diagnosed based on your symptoms and a physical exam. You may have blood and urine tests to help confirm the diagnosis. How is this treated? Treatment for this condition depends on how severe it is. Treatment should be started right away. Do not wait until dehydration becomes severe. Severe dehydration is an emergency and needs to be treated in a hospital.  Mild or moderate dehydration can be treated at home. You may be asked to: ? Drink more fluids. ? Drink an oral rehydration solution (ORS). This drink helps restore proper amounts of fluids and salts and minerals in the blood (electrolytes).  Severe dehydration can be treated: ? With IV fluids. ? By correcting abnormal levels of electrolytes. This is often done by giving electrolytes through a tube that is passed through your nose and into your stomach (nasogastric tube, or NG tube). ? By treating the underlying cause of dehydration. Follow these instructions at home: Oral rehydration solution If told by your health care provider, drink an ORS:  Make  an ORS by following instructions on the package.  Start by drinking small amounts, about  cup (120 mL) every 5-10 minutes.  Slowly increase how much you drink until you have taken the amount recommended by your health care provider. Eating and drinking         Drink enough clear fluid to keep your urine pale yellow. If you were told to drink an ORS, finish the ORS first and then start slowly  drinking other clear fluids. Drink fluids such as: ? Water. Do not drink only water. Doing that can lead to hyponatremia, which is having too little salt (sodium) in the body. ? Water from ice chips you suck on. ? Fruit juice that you have added water to (diluted fruit juice). ? Low-calorie sports drinks.  Eat foods that contain a healthy balance of electrolytes, such as bananas, oranges, potatoes, tomatoes, and spinach.  Do not drink alcohol.  Avoid the following: ? Drinks that contain a lot of sugar. These include high-calorie sports drinks, fruit juice that is not diluted, and soda. ? Caffeine. ? Foods that are greasy or contain a lot of fat or sugar. General instructions  Take over-the-counter and prescription medicines only as told by your health care provider.  Do not take sodium tablets. Doing that can lead to having too much sodium in the body (hypernatremia).  Return to your normal activities as told by your health care provider. Ask your health care provider what activities are safe for you.  Keep all follow-up visits as told by your health care provider. This is important. Contact a health care provider if:  You have muscle cramps, pain, or discomfort, such as: ? Pain in your abdomen and the pain gets worse or stays in one area (localizes). ? Stiff neck.  You have a rash.  You are more irritable than usual.  You are sleepier or have a harder time waking than usual.  You feel weak or dizzy.  You feel very thirsty. Get help right away if you have:  Any symptoms of severe dehydration.  Symptoms of vomiting, such as: ? You cannot eat or drink without vomiting. ? Vomiting gets worse or does not go away. ? Vomit includes blood or green matter (bile).  Symptoms that get worse with treatment.  A fever.  A severe headache.  Problems with urination or bowel movements, such as: ? Diarrhea that gets worse or does not go away. ? Blood in your stool (feces). This  may cause stool to look black and tarry. ? Not urinating, or urinating only a small amount of very dark urine, within 6-8 hours.  Trouble breathing. These symptoms may represent a serious problem that is an emergency. Do not wait to see if the symptoms will go away. Get medical help right away. Call your local emergency services (911 in the [place] Do not drive yourself to the hospital. Summary  Dehydration is a condition in which there is not enough water or other fluids in the body. This happens when a person loses more fluids than he or she takes in.  Treatment for this condition depends on how severe it is. Treatment should be started right away. Do not wait until dehydration becomes severe.  Drink enough clear fluid to keep your urine pale yellow. If you were told to drink an oral rehydration solution (ORS), finish the ORS first and then start slowly drinking other clear fluids.  Take over-the-counter and prescription medicines only as told by your health care  provider.  Get help right away if you have any symptoms of severe dehydration. This information is not intended to replace advice given to you by your health care provider. Make sure you discuss any questions you have with your health care provider. Document Revised: 10/10/2018 Document Reviewed: 10/10/2018 Elsevier Patient Education  [place].    Hypokalemia Hypokalemia means that the amount of potassium in the blood is lower than normal. Potassium is a chemical (electrolyte) that helps regulate the amount of fluid in the body. It also stimulates muscle tightening (contraction) and helps nerves work properly. Normally, most of the body's potassium is inside cells, and only a very small amount is in the blood. Because the amount in the blood is so small, minor changes to potassium levels in the blood can be life-threatening. What are the causes? This condition may be caused by:  Antibiotic medicine.  Diarrhea or  vomiting. Taking too much of a medicine that helps you have a bowel movement (laxative) can cause diarrhea and lead to hypokalemia.  Chronic kidney disease (CKD).  Medicines that help the body get rid of excess fluid (diuretics).  Eating disorders, such as bulimia.  Low magnesium levels in the body.  Sweating a lot. What are the signs or symptoms? Symptoms of this condition include:  Weakness.  Constipation.  Fatigue.  Muscle cramps.  Mental confusion.  Skipped heartbeats or irregular heartbeat (palpitations).  Tingling or numbness. How is this diagnosed? This condition is diagnosed with a blood test. How is this treated? This condition may be treated by:  Taking potassium supplements by mouth.  Adjusting the medicines that you take.  Eating more foods that contain a lot of potassium. If your potassium level is very low, you may need to get potassium through an IV and be monitored in the hospital. Follow these instructions at home:   Take over-the-counter and prescription medicines only as told by your health care provider. This includes vitamins and supplements.  Eat a healthy diet. A healthy diet includes fresh fruits and vegetables, whole grains, healthy fats, and lean proteins.  If instructed, eat more foods that contain a lot of potassium. This includes: ? Nuts, such as peanuts and pistachios. ? Seeds, such as sunflower seeds and pumpkin seeds. ? Peas, lentils, and lima beans. ? Whole grain and bran cereals and breads. ? Fresh fruits and vegetables, such as apricots, avocado, bananas, cantaloupe, kiwi, oranges, tomatoes, asparagus, and potatoes. ? Orange juice. ? Tomato juice. ? Red meats. ? Yogurt.  Keep all follow-up visits as told by your health care provider. This is important. Contact a health care provider if you:  Have weakness that gets worse.  Feel your heart pounding or racing.  Vomit.  Have diarrhea.  Have diabetes (diabetes mellitus)  and you have trouble keeping your blood sugar (glucose) in your target range. Get help right away if you:  Have chest pain.  Have shortness of breath.  Have vomiting or diarrhea that lasts for more than 2 days.  Faint. Summary  Hypokalemia means that the amount of potassium in the blood is lower than normal.  This condition is diagnosed with a blood test.  Hypokalemia may be treated by taking potassium supplements, adjusting the medicines that you take, or eating more foods that are high in potassium.  If your potassium level is very low, you may need to get potassium through an IV and be monitored in the hospital. This information is not intended to replace advice given to  you by your health care provider. Make sure you discuss any questions you have with your health care provider. Document Revised: 10/10/2017 Document Reviewed: 10/10/2017 Elsevier Patient Education  [place].

## 2019-07-02 NOTE — Progress Notes (Signed)
Pt received 1L IVF NS today and IV zofran, tolerated well.  VSS.  Pt reports feeling better at end of tx w/no further N/V.  Pt declined any food or to use restroom but was able to tolerate drinking ginger ale.  Pt aware to f/u as needed with any questions/concerns and to continue taking oral potassium at home at this time.

## 2019-07-02 NOTE — Progress Notes (Signed)
[facility] Progress Note   Christy Abbott [id number] 1955/03/15 64 y.o.  Christy Abbott is managed by Dr. Fanny Bien. Mohamed  Actively treated with chemotherapy/immunotherapy/hormonal therapy: yes  Current therapy: Cisplatin and etoposide  Last treated: 06/30/2019 (cycle 3, day 5)  Next scheduled appointment with provider: 07/15/2019  Assessment: Plan:    Non-intractable vomiting with nausea, unspecified vomiting type - Plan: 0.9 %  sodium chloride infusion, ondansetron (ZOFRAN) injection 8 mg, LORazepam (ATIVAN) 0.5 MG tablet  Port-A-Cath in place - Plan: sodium chloride flush (NS) 0.9 % injection 10 mL, heparin lock flush 100 unit/mL  Primary lung small cell carcinoma, left (HCC)  Small cell lung cancer (HCC)   Nausea and vomiting: Patient was given 1 L normal saline IV today.  Additionally she was given Zofran 8 mg IV.  Her antiemetics were reviewed with her.  Additionally she was given a prescription for Ativan 0.5 mg sublingual daily for nausea.  Small cell lung cancer: The patient is status post cycle 3, day 5 of cisplatin and etoposide given with pegfilgrastim support.  She is scheduled to return for follow-up on 07/15/2019.  Please see After Visit Summary for patient specific instructions.  Future Appointments  Date Time Provider [place]  07/09/2019  9:30 AM [facility] None  07/16/2019  8:30 AM [facility] 5 [facility] None  07/16/2019  9:00 AM Curt Bears, MD [facility] None  07/16/2019 10:00 AM [facility] INFUSION [facility] None  07/17/2019  8:30 AM [facility] INFUSION [facility] None  07/18/2019  8:30 AM [facility] INFUSION [facility] None  07/21/2019 11:00 AM [facility] 5 [facility] None  07/21/2019 11:30 AM [facility] [place] [facility]USH [facility] None  07/22/2019 11:00 AM [facility] LAB [facility] None  07/24/2019 10:15 AM Gery Pray, MD [facility] None  07/25/2019  1:45 PM Elayne Snare, MD [facility] None   07/29/2019 11:45 AM [facility] LAB 2 [facility] None  08/05/2019  8:00 AM [facility] 5 [facility] None  08/05/2019  8:30 AM Heilingoetter, Cassandra L, PA-C [facility] None  08/05/2019  9:30 AM [facility] INFUSION [facility] None  08/06/2019  9:30 AM [facility] INFUSION [facility] None  08/07/2019  8:45 AM [facility] INFUSION [facility] None  08/09/2019  9:15 AM [facility] [place] FLUSH [facility] None    No orders of the defined types were placed in this encounter.      Subjective:   Patient ID:  Christy Abbott is a 64 y.o. (DOB Aug 01, 1955) female.  Chief Complaint:  Chief Complaint  Patient presents with  . Emesis  . Fatigue    HPI Christy Abbott  is a 64 y.o. female with a diagnosis of small cell lung cancer who is managed by Dr. Julien Nordmann and is status post cycle 3-day 5 of cisplatin and etoposide with pegfilgrastim support.  She completed her most recent cycle of chemotherapy on 06/30/2019.  She presents to the clinic today with nausea, vomiting, diarrhea, weakness, anorexia, mild dizziness, shortness of breath, and dyspnea on exertion.  She has diabetes.  She has been checking her blood sugar and reports that her blood sugar has been "decent."  She has Zofran and Compazine at home for her nausea but is been unable to keep these down.  Medications: I have reviewed the patient's current medications.  Allergies:  Allergies  Allergen Reactions  . Jardiance [Empagliflozin] Palpitations    Causes heart palpitations  . Trazodone And Nefazodone Other (See Comments)    "hyped me up, could not sleep"    Past Medical History:  Diagnosis Date  . Arthritis   . Atrial flutter [facility])    s/p CTI by Dr Lovena Le 02/2013  . Chest pain   . Diabetes mellitus without complication ([place])   . Dysrhythmia    hx of Aflutter  . 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 Hemochromatosis associated with compound heterozygous mutation in HFE gene ([place]) 12/10/2017  .  Hyperlipidemia   . Hypertension   . Hyperthyroidism 10/31/2014    Past Surgical History:  Procedure Laterality Date  . ABLATION  03-04-2013   CTI by Dr Lovena Le for atrial flutter  . ATRIAL FLUTTER ABLATION N/A 03/04/2013   Procedure: ATRIAL FLUTTER ABLATION;  Surgeon: Evans Lance, MD;  Location: [facility] C[facility] LAB;  Service: Cardiovascular;  Laterality: N/A;  . CHOLECYSTECTOMY  1982  . COLONOSCOPY W/ POLYPECTOMY     x 2  . ESOPHAGOGASTRODUODENOSCOPY N/A 05/20/2014   Procedure: ESOPHAGOGASTRODUODENOSCOPY (EGD);  Surgeon: Teena Irani, MD;  Location: [facility];  Service: Endoscopy;  Laterality: N/A;  . IR IMAGING GUIDED PORT INSERTION  05/09/2019  . TONSILLECTOMY  1972 ?  Marland Kitchen VIDEO BRONCHOSCOPY WITH ENDOBRONCHIAL ULTRASOUND Left 04/30/2019   Procedure: VIDEO BRONCHOSCOPY WITH ENDOBRONCHIAL ULTRASOUND;  Surgeon: Garner Nash, DO;  Location: [place];  Service: Pulmonary;  Laterality: Left;    Family History  Problem Relation Age of Onset  . Atrial fibrillation Mother   . Arrhythmia Mother   . Diabetes Mother   . Thyroid disease Mother   . Hodgkin's lymphoma Father   . Thyroid disease Daughter        Graves disease  . Diabetes Daughter   . Anxiety disorder Neg Hx     Social History   Socioeconomic History  . Marital status: Married    Spouse name: Not on file  . Number of children: Not on file  . Years of education: Not on file  . Highest education level: Not on file  Occupational History  . Not on file  Tobacco Use  . Smoking status: Former Smoker    Packs/day: 1.00    Types: Cigarettes    Quit date: 03/21/2019    Years since quitting: 0.2  . Smokeless tobacco: Never Used  . Tobacco comment: started age 4 , quit for a year many times  Substance and Sexual Activity  . Alcohol use: No    Alcohol/week: 0.0 standard drinks  . Drug use: No  . Sexual activity: Not on file  Other Topics Concern  . Not on file  Social History Narrative   Married   Clinical cytogeneticist- full time    Daughter- 2 grandchildren, live in [place]   Enjoys playing on computer.     Social Determinants of Health   Financial Resource Strain:   . Difficulty of Paying Living Expenses:   Food Insecurity:   . Worried About Charity fundraiser in the Last Year:   . Arboriculturist in the Last Year:   Transportation Needs:   . Film/video editor (Medical):   Marland Kitchen Lack of Transportation (Non-Medical):   Physical Activity:   . Days of Exercise per Week:   . Minutes of Exercise per Session:   Stress:   . Feeling of Stress :   Social Connections:   . Frequency of Communication with Friends and Family:   . Frequency of Social Gatherings with Friends and Family:   . Attends Religious Services:   . Active Member of Clubs or Organizations:   . Attends  Club or Organization Meetings:   Marland Kitchen Marital Status:   Intimate Partner Violence:   . Fear of Current or Ex-Partner:   . Emotionally Abused:   Marland Kitchen Physically Abused:   . Sexually Abused:     Past Medical History, Surgical history, Social history, and Family history were reviewed and updated as appropriate.   Please see review of systems for further details on the patient's review from today.   Review of Systems:  Review of Systems  Constitutional: Positive for appetite change. Negative for chills, diaphoresis and fever.  HENT: Negative for trouble swallowing.   Respiratory: Negative for cough, choking, shortness of breath and wheezing.   Cardiovascular: Negative for chest pain and palpitations.  Gastrointestinal: Positive for diarrhea, nausea and vomiting. Negative for constipation.  Genitourinary: Negative for decreased urine volume.  Neurological: Positive for dizziness and weakness. Negative for headaches.    Objective:   Physical Exam:  BP 121/63   Pulse 88   Temp 98.2 F (36.8 C) (Temporal)   Resp 18   Ht 5\' 4"  (1.626 m)   Wt 139 lb (63 kg)   SpO2 100%   BMI 23.86 kg/m  ECOG: 1  Physical Exam Constitutional:      General:  She is not in acute distress.    Appearance: She is not diaphoretic.  HENT:     Head: Normocephalic and atraumatic.     Mouth/Throat:     Mouth: Mucous membranes are dry.     Pharynx: No oropharyngeal exudate or posterior oropharyngeal erythema.  Eyes:     General: No scleral icterus.       Right eye: No discharge.        Left eye: No discharge.     Conjunctiva/sclera: Conjunctivae normal.  Cardiovascular:     Rate and Rhythm: Normal rate and regular rhythm.     Heart sounds: Normal heart sounds. No murmur. No friction rub. No gallop.   Pulmonary:     Effort: Pulmonary effort is normal. No respiratory distress.     Breath sounds: Normal breath sounds. No wheezing or rales.  Abdominal:     General: Bowel sounds are normal. There is no distension.     Palpations: Abdomen is soft. There is no mass.     Tenderness: There is no abdominal tenderness. There is no guarding or rebound.  Musculoskeletal:     Cervical back: Normal range of motion and neck supple.  Lymphadenopathy:     Cervical: No cervical adenopathy.  Skin:    General: Skin is warm and dry.     Findings: No erythema or rash.  Neurological:     Mental Status: She is alert.     Coordination: Coordination normal.  Psychiatric:        Behavior: Behavior normal.        Thought Content: Thought content normal.        Judgment: Judgment normal.     Lab Review:     Component Value Date/Time   NA 141 07/02/2019 0928   NA 140 02/21/2018 0955   K 3.4 (L) 07/02/2019 0928   CL 98 07/02/2019 0928   CO2 29 07/02/2019 0928   GLUCOSE 192 (H) 07/02/2019 0928   BUN 30 (H) 07/02/2019 0928   BUN 14 02/21/2018 0955   CREATININE 0.92 07/02/2019 0928   CREATININE 0.82 01/22/2015 1556   CALCIUM 9.4 07/02/2019 0928   PROT 6.6 07/02/2019 0928   PROT 6.8 02/21/2018 0955   ALBUMIN 3.5 07/02/2019 3875  ALBUMIN 4.3 02/21/2018 0955   AST 10 (L) 07/02/2019 0928   ALT 12 07/02/2019 0928   ALKPHOS 124 07/02/2019 0928   BILITOT 0.6  07/02/2019 0928   GFRNONAA >60 07/02/2019 0928   GFRAA >60 07/02/2019 0928       Component Value Date/Time   WBC 11.8 (H) 07/02/2019 0928   WBC 16.8 (H) 05/17/2019 1537   RBC 3.31 (L) 07/02/2019 0928   HGB 10.0 (L) 07/02/2019 0928   HGB WILL FOLLOW 02/21/2018 0955   HCT 30.1 (L) 07/02/2019 0928   HCT WILL FOLLOW 02/21/2018 0955   PLT 142 (L) 07/02/2019 0928   PLT WILL FOLLOW 02/21/2018 0955   MCV 90.9 07/02/2019 0928   MCV WILL FOLLOW 02/21/2018 0955   MCH 30.2 07/02/2019 0928   MCHC 33.2 07/02/2019 0928   RDW 15.8 (H) 07/02/2019 0928   RDW WILL FOLLOW 02/21/2018 0955   LYMPHSABS 0.5 (L) 07/02/2019 0928   LYMPHSABS WILL FOLLOW 02/21/2018 0955   MONOABS 0.0 (L) 07/02/2019 0928   EOSABS 0.0 07/02/2019 0928   EOSABS WILL FOLLOW 02/21/2018 0955   BASOSABS 0.1 07/02/2019 0928   BASOSABS WILL FOLLOW 02/21/2018 0955   -------------------------------  Imaging from last 24 hours (if applicable):  Radiology interpretation: CT Chest W Contrast  Result Date: 06/24/2019 CLINICAL DATA:  Limited stage small cell left upper lobe carcinoma diagnosed 04/30/2019 with ongoing chemotherapy and radiation therapy. Restaging. EXAM: CT CHEST WITH CONTRAST TECHNIQUE: Multidetector CT imaging of the chest was performed during intravenous contrast administration. CONTRAST:  28mL OMNIPAQUE IOHEXOL 300 MG/ML  SOLN COMPARISON:  04/22/2019 PET-CT. 03/22/2019 chest CT angiogram. FINDINGS: Cardiovascular: Normal heart size. No significant pericardial effusion/thickening. Right internal jugular Port-A-Cath terminates at the cavoatrial junction. Atherosclerotic nonaneurysmal thoracic aorta. Normal caliber pulmonary arteries. Incidental acute left-sided pulmonary embolism involving lobar, segmental and subsegmental left lower lobe branches. Mediastinum/Nodes: No discrete thyroid nodules. Unremarkable esophagus. No pathologically enlarged axillary, mediastinal or hilar lymph nodes. Lungs/Pleura: No pneumothorax. No  pleural effusion. Mild centrilobular and paraseptal emphysema with mild diffuse bronchial wall thickening. Central left upper lobe 0.5 cm solid pulmonary nodule (series 7/image 66), decreased from 1.0 cm on 03/22/2019 chest CT. Previously visualized dominant central left upper lobe 2.2 cm solid pulmonary nodule on 03/22/2019 chest CT study has resolved. Posterior left lower lobe 0.5 cm solid pulmonary nodule (series 7/image 90) is decreased from 1.8 cm. No acute consolidative airspace disease or new significant pulmonary nodules. Upper abdomen: Cholecystectomy. Musculoskeletal: No aggressive appearing focal osseous lesions. Mild thoracic spondylosis. IMPRESSION: 1. Incidental acute left-sided pulmonary embolism involving lobar, segmental and subsegmental left lower lobe branches. 2. Interval positive response to therapy. The hypermetabolic central left upper lobe 2.2 cm solid pulmonary nodule seen on 04/22/2019 PET-CT study has resolved. Additional smaller left upper lobe and left lower lobe pulmonary nodules are decreased. No new or progressive metastatic disease in the chest. 3. Aortic Atherosclerosis (ICD10-I70.0) and Emphysema (ICD10-J43.9). Critical Value/emergent results were called by telephone at the time of interpretation on 06/24/2019 at 2:21 pm to provider [facility] , who verbally acknowledged these results. Electronically Signed   By: Ilona Sorrel M.D.   On: 06/24/2019 14:27        This case was discussed with Dr. Julien Nordmann. He expressed agreement with my management of this patient.

## 2019-07-07 ENCOUNTER — Inpatient Hospital Stay: Payer: 59

## 2019-07-07 ENCOUNTER — Telehealth: Payer: Self-pay | Admitting: Emergency Medicine

## 2019-07-07 ENCOUNTER — Other Ambulatory Visit: Payer: Self-pay

## 2019-07-07 ENCOUNTER — Inpatient Hospital Stay (HOSPITAL_BASED_OUTPATIENT_CLINIC_OR_DEPARTMENT_OTHER): Payer: 59 | Admitting: Medical

## 2019-07-07 VITALS — BP 91/69 | HR 69 | Temp 97.8°F | Resp 18 | Ht 64.0 in | Wt 139.1 lb

## 2019-07-07 DIAGNOSIS — C3492 Malignant neoplasm of unspecified part of left bronchus or lung: Secondary | ICD-10-CM | POA: Diagnosis not present

## 2019-07-07 DIAGNOSIS — I950 Idiopathic hypotension: Secondary | ICD-10-CM

## 2019-07-07 DIAGNOSIS — Z5111 Encounter for antineoplastic chemotherapy: Secondary | ICD-10-CM | POA: Diagnosis not present

## 2019-07-07 DIAGNOSIS — D696 Thrombocytopenia, unspecified: Secondary | ICD-10-CM | POA: Diagnosis not present

## 2019-07-07 DIAGNOSIS — D649 Anemia, unspecified: Secondary | ICD-10-CM

## 2019-07-07 LAB — MAGNESIUM: Magnesium: 1.7 mg/dL (ref 1.7–2.4)

## 2019-07-07 LAB — CMP (CANCER CENTER ONLY)
ALT: 7 U/L (ref 0–44)
AST: 9 U/L — ABNORMAL LOW (ref 15–41)
Albumin: 3.1 g/dL — ABNORMAL LOW (ref 3.5–5.0)
Alkaline Phosphatase: 107 U/L (ref 38–126)
Anion gap: 14 (ref 5–15)
BUN: 26 mg/dL — ABNORMAL HIGH (ref 8–23)
CO2: 28 mmol/L (ref 22–32)
Calcium: 9.3 mg/dL (ref 8.9–10.3)
Chloride: 95 mmol/L — ABNORMAL LOW (ref 98–111)
Creatinine: 0.93 mg/dL (ref 0.44–1.00)
GFR, Est AFR Am: >60 mL/min (ref >60)
GFR, Estimated: >60 mL/min (ref >60)
Glucose, Bld: 256 mg/dL — ABNORMAL HIGH (ref 70–99)
Potassium: 3.9 mmol/L (ref 3.5–5.1)
Sodium: 137 mmol/L (ref 135–145)
Total Bilirubin: 1 mg/dL (ref 0.3–1.2)
Total Protein: 6.6 g/dL (ref 6.5–8.1)

## 2019-07-07 LAB — CBC WITH DIFFERENTIAL (CANCER CENTER ONLY)
Abs Immature Granulocytes: 0.02 10*3/uL (ref 0.00–0.07)
Basophils Absolute: 0 10*3/uL (ref 0.0–0.1)
Basophils Relative: 2 %
Eosinophils Absolute: 0 10*3/uL (ref 0.0–0.5)
Eosinophils Relative: 4 %
HCT: 20.6 % — ABNORMAL LOW (ref 36.0–46.0)
Hemoglobin: 7.1 g/dL — ABNORMAL LOW (ref 12.0–15.0)
Immature Granulocytes: 3 %
Lymphocytes Relative: 57 %
Lymphs Abs: 0.4 10*3/uL — ABNORMAL LOW (ref 0.7–4.0)
MCH: 30.5 pg (ref 26.0–34.0)
MCHC: 34.5 g/dL (ref 30.0–36.0)
MCV: 88.4 fL (ref 80.0–100.0)
Monocytes Absolute: 0 10*3/uL — ABNORMAL LOW (ref 0.1–1.0)
Monocytes Relative: 6 %
Neutro Abs: 0.2 10*3/uL — CL (ref 1.7–7.7)
Neutrophils Relative %: 28 %
Platelet Count: 10 10*3/uL — ABNORMAL LOW (ref 150–400)
RBC: 2.33 MIL/uL — ABNORMAL LOW (ref 3.87–5.11)
RDW: 14.9 % (ref 11.5–15.5)
WBC Count: 0.7 10*3/uL — CL (ref 4.0–10.5)
nRBC: 0 % (ref 0.0–0.2)

## 2019-07-07 LAB — PREPARE RBC (CROSSMATCH)

## 2019-07-07 LAB — ABO/RH: ABO/RH(D): A POS

## 2019-07-07 LAB — SAMPLE TO BLOOD BANK

## 2019-07-07 MED ORDER — SODIUM CHLORIDE 0.9 % IV SOLN
Freq: Once | INTRAVENOUS | Status: AC
  Filled 2019-07-07: qty 250

## 2019-07-07 MED ORDER — ACETAMINOPHEN 325 MG PO TABS
650.0000 mg | ORAL_TABLET | Freq: Once | ORAL | Status: AC
  Administered 2019-07-07: 650 mg via ORAL

## 2019-07-07 MED ORDER — DIPHENHYDRAMINE HCL 25 MG PO CAPS
25.0000 mg | ORAL_CAPSULE | Freq: Once | ORAL | Status: AC
  Administered 2019-07-07: 25 mg via ORAL

## 2019-07-07 MED ORDER — ACETAMINOPHEN 325 MG PO TABS
ORAL_TABLET | ORAL | Status: AC
  Filled 2019-07-07: qty 2

## 2019-07-07 MED ORDER — SODIUM CHLORIDE 0.9% FLUSH
10.0000 mL | Freq: Once | INTRAVENOUS | Status: DC
  Filled 2019-07-07: qty 10

## 2019-07-07 MED ORDER — DIPHENHYDRAMINE HCL 25 MG PO CAPS
ORAL_CAPSULE | ORAL | Status: AC
  Filled 2019-07-07: qty 1

## 2019-07-07 MED ORDER — SODIUM CHLORIDE 0.9% IV SOLUTION
250.0000 mL | Freq: Once | INTRAVENOUS | Status: AC
  Administered 2019-07-07: 250 mL via INTRAVENOUS
  Filled 2019-07-07: qty 250

## 2019-07-07 MED ORDER — HEPARIN SOD (PORK) LOCK FLUSH 100 UNIT/ML IV SOLN
500.0000 [IU] | Freq: Once | INTRAVENOUS | Status: DC
  Filled 2019-07-07: qty 5

## 2019-07-07 NOTE — Telephone Encounter (Signed)
Incoming call on triage line, spoke with pt who is reporting severe fatigue & weakness since last week.  Denies N/V/D or dizziness/falls or fever/chills.  Pt reports large blood clots when blowing nose.  Pt seen in [facility] last week for dehydration.  Pt agreed to come in for labs/[facility] appt today.  High priority scheduling messages sent, lab orders placed.

## 2019-07-07 NOTE — Progress Notes (Signed)
Critical labs received at 1353: WBC 0.7 Platelets 10 Hgb 7.1 PA Lucianne Lei made aware.  Critical lab received at 1434: ANC 0.2 PA Lucianne Lei made aware.  Pt received 1 unit platelets and is in process of receiving 1 unit PRBCs, tolerating well so far.  Pt able to drink fluids but declined food or to use restroom at this time.  Pt transferred via ambulation w/belongings to infusion suite section I, in person report given to RN Kathlee Nations.

## 2019-07-07 NOTE — Patient Instructions (Addendum)
Blood Transfusion, Adult A blood transfusion is a procedure in which you receive blood or a type of blood cell (blood component) through an IV. You may need a blood transfusion when your blood level is low. This may result from a bleeding disorder, illness, injury, or surgery. The blood may come from a donor. You may also be able to donate blood for yourself (autologous blood donation) before a planned surgery. The blood given in a transfusion is made up of different blood components. You may receive:  Red blood cells. These carry oxygen to the cells in the body.  Platelets. These help your blood to clot.  Plasma. This is the liquid part of your blood. It carries proteins and other substances throughout the body.  White blood cells. These help you fight infections. If you have hemophilia or another clotting disorder, you may also receive other types of blood products. Tell a health care provider about:  Any blood disorders you have.  Any previous reactions you have had during a blood transfusion.  Any allergies you have.  All medicines you are taking, including vitamins, herbs, eye drops, creams, and over-the-counter medicines.  Any surgeries you have had.  Any medical conditions you have, including any recent fever or cold symptoms.  Whether you are pregnant or may be pregnant. What are the risks? Generally, this is a safe procedure. However, problems may occur.  The most common problems include: ? A mild allergic reaction, such as red, swollen areas of skin (hives) and itching. ? Fever or chills. This may be the body's response to new blood cells received. This may occur during or up to 4 hours after the transfusion.  More serious problems may include: ? Transfusion-associated circulatory overload (TACO), or too much fluid in the lungs. This may cause breathing problems. ? A serious allergic reaction, such as difficulty breathing or swelling around the face and  lips. ? Transfusion-related acute lung injury (TRALI), which causes breathing difficulty and low oxygen in the blood. This can occur within hours of the transfusion or several days later. ? Iron overload. This can happen after receiving many blood transfusions over a period of time. ? Infection or virus being transmitted. This is rare because donated blood is carefully tested before it is given. ? Hemolytic transfusion reaction. This is rare. It happens when your body's defense system (immune system)tries to attack the new blood cells. Symptoms may include fever, chills, nausea, low blood pressure, and low back or chest pain. ? Transfusion-associated graft-versus-host disease (TAGVHD). This is rare. It happens when donated cells attack your body's healthy tissues. What happens before the procedure? Medicines Ask your health care provider about:  Changing or stopping your regular medicines. This is especially important if you are taking diabetes medicines or blood thinners.  Taking medicines such as aspirin and ibuprofen. These medicines can thin your blood. Do not take these medicines unless your health care provider tells you to take them.  Taking over-the-counter medicines, vitamins, herbs, and supplements. General instructions  Follow instructions from your health care provider about eating and drinking restrictions.  You will have a blood test to determine your blood type. This is necessary to know what kind of blood your body will accept and to match it to the donor blood.  If you are going to have a planned surgery, you may be able to do an autologous blood donation. This may be done in case you need to have a transfusion.  You will have your temperature,  blood pressure, and pulse monitored before the transfusion.  If you have had an allergic reaction to a transfusion in the past, you may be given medicine to help prevent a reaction. This medicine may be given to you by mouth (orally)  or through an IV.  Set aside time for the blood transfusion. This procedure generally takes 1-4 hours to complete. What happens during the procedure?   An IV will be inserted into one of your veins.  The bag of donated blood will be attached to your IV. The blood will then enter through your vein.  Your temperature, blood pressure, and pulse will be monitored regularly during the transfusion. This monitoring is done to detect early signs of a transfusion reaction.  Tell your nurse right away if you have any of these symptoms during the transfusion: ? Shortness of breath or trouble breathing. ? Chest or back pain. ? Fever or chills. ? Hives or itching.  If you have any signs or symptoms of a reaction, your transfusion will be stopped and you may be given medicine.  When the transfusion is complete, your IV will be removed.  Pressure may be applied to the IV site for a few minutes.  A bandage (dressing)will be applied. The procedure may vary among health care providers and hospitals. What happens after the procedure?  Your temperature, blood pressure, pulse, breathing rate, and blood oxygen level will be monitored until you leave the hospital or clinic.  Your blood may be tested to see how you are responding to the transfusion.  You may be warmed with fluids or blankets to maintain a normal body temperature.  If you receive your blood transfusion in an outpatient setting, you will be told whom to contact to report any reactions. Where to find more information For more information on blood transfusions, visit the [employer]: redcross.org Summary  A blood transfusion is a procedure in which you receive blood or a type of blood cell (blood component) through an IV.  The blood you receive may come from a donor or be donated by yourself (autologous blood donation) before a planned surgery.  The blood given in a transfusion is made up of different blood components. You may  receive red blood cells, platelets, plasma, or white blood cells depending on the condition treated.  Your temperature, blood pressure, and pulse will be monitored before, during, and after the transfusion.  After the transfusion, your blood may be tested to see how your body has responded. This information is not intended to replace advice given to you by your health care provider. Make sure you discuss any questions you have with your health care provider. Document Revised: 08/22/2018 Document Reviewed: 08/22/2018 Elsevier Patient Education  [employer].   Platelet Transfusion A platelet transfusion is a procedure in which you receive donated platelets through an IV. Platelets are tiny pieces of blood cells. When you get an injury, platelets clump together in the area to form a blood clot. This helps stop bleeding and is the beginning of the healing process. If you have too few platelets, your blood may have trouble clotting. This may cause you to bleed and bruise very easily. You may need a platelet transfusion if you have a condition that causes a low number of platelets (thrombocytopenia). A platelet transfusion may be used to stop or prevent excessive bleeding. Tell a health care provider about:  Any reactions you have had during previous transfusions.  Any allergies you have.  All medicines you are taking, including vitamins, herbs, eye drops, creams, and over-the-counter medicines.  Any blood disorders you have.  Any surgeries you have had.  Any medical conditions you have.  Whether you are pregnant or may be pregnant. What are the risks? Generally, this is a safe procedure. However, problems may occur, including:  Fever.  Infection.  Allergic reaction to the donor platelets.  Your body's disease-fighting system (immune system) attacking the donor platelets (hemolytic reaction). This is rare.  A rare reaction that causes lung damage (transfusion-related acute  lung injury). What happens before the procedure? Medicines  Ask your health care provider about: ? Changing or stopping your regular medicines. This is especially important if you are taking diabetes medicines or blood thinners. ? Taking medicines such as aspirin and ibuprofen. These medicines can thin your blood. Do not take these medicines unless your health care provider tells you to take them. ? Taking over-the-counter medicines, vitamins, herbs, and supplements. General instructions  You will have a blood test to determine your blood type. Your blood type determines what kind of platelets you will be given.  Follow instructions from your health care provider about eating or drinking restrictions.  If you have had an allergic reaction to a transfusion in the past, you may be given medicine to help prevent a reaction.  Your temperature, blood pressure, pulse, and breathing will be monitored. What happens during the procedure?   An IV will be inserted into one of your veins.  For your safety, two health care providers will verify your identity along with the donor platelets about to be infused.  A bag of donor platelets will be connected to your IV. The platelets will flow into your bloodstream. This usually takes 30-60 minutes.  Your temperature, blood pressure, pulse, and breathing will be monitored during the transfusion. This helps detect early signs of any reaction.  You will also be monitored for other symptoms that may indicate a reaction, including chills, hives, or itching.  If you have signs of a reaction at any time, your transfusion will be stopped, and you may be given medicine to help manage the reaction.  When your transfusion is complete, your IV will be removed.  Pressure may be applied to the IV site for a few minutes to stop any bleeding.  The IV site will be covered with a bandage (dressing). The procedure may vary among health care providers and  hospitals. What happens after the procedure?  Your blood pressure, temperature, pulse, and breathing will be monitored until you leave the hospital or clinic.  You may have some bruising and soreness at your IV site. Follow these instructions at home: Medicines  Take over-the-counter and prescription medicines only as told by your health care provider.  Talk with your health care provider before you take any medicines that contain aspirin or NSAIDs. These medicines increase your risk for dangerous bleeding. General instructions  Change or remove your dressing as told by your health care provider.  Return to your normal activities as told by your health care provider. Ask your health care provider what activities are safe for you.  Do not take baths, swim, or use a hot tub until your health care provider approves. Ask your health care provider if you may take showers.  Check your IV site every day for signs of infection. Check for: ? Redness, swelling, or pain. ? Fluid or blood. If fluid or blood drains from your IV site, use your hands  to press down firmly on a bandage covering the area for a minute or two. Doing this should stop the bleeding. ? Warmth. ? Pus or a bad smell.  Keep all follow-up visits as told by your health care provider. This is important. Contact a health care provider if you have:  A headache that does not go away with medicine.  Hives, rash, or itchy skin.  Nausea or vomiting.  Unusual tiredness or weakness.  Signs of infection at your IV site. Get help right away if:  You have a fever or chills.  You urinate less often than usual.  Your urine is darker colored than normal.  You have any of the following: ? Trouble breathing. ? Pain in your back, abdomen, or chest. ? Cool, clammy skin. ? A fast heartbeat. Summary  Platelets are tiny pieces of blood cells that clump together to form a blood clot when you have an injury. If you have too few  platelets, your blood may have trouble clotting.  A platelet transfusion is a procedure in which you receive donated platelets through an IV.  A platelet transfusion may be used to stop or prevent excessive bleeding.  After the procedure, check your IV site every day for signs of infection, including redness, swelling, pain, or warmth. This information is not intended to replace advice given to you by your health care provider. Make sure you discuss any questions you have with your health care provider. Document Revised: 04/04/2017 Document Reviewed: 04/04/2017 Elsevier Patient Education  2020 [employer].

## 2019-07-07 NOTE — Progress Notes (Signed)
[facility] Progress Note   Christy Abbott [id number] [birth year] 64 y.o.  Christy Abbott is managed by Dr. Fanny Bien. Mohamed  Actively treated with chemotherapy/immunotherapy/hormonal therapy: yes  Current therapy: Cisplatin and etoposide  Last treated: 06/30/2019 (cycle 3, day 5)  Next scheduled appointment with provider: 07/15/2019  Assessment: Plan:    Primary lung small cell carcinoma, left (HCC) - Plan: 0.9 %  sodium chloride infusion, sodium chloride flush (NS) 0.9 % injection 10 mL, heparin lock flush 100 unit/mL  Thrombocytopenia ([place]) - Plan: Informed Consent Details: Physician/Practitioner Attestation; Transcribe to consent form and obtain patient signature, Type and screen, Care order/instruction, 0.9 %  sodium chloride infusion (Manually program via Guardrails IV Fluids), Prepare RBC (crossmatch), Transfuse RBC, Prepare Pheresed Platelets, Transfuse platelet pheresis, acetaminophen (TYLENOL) tablet 650 mg, diphenhydrAMINE (BENADRYL) capsule 25 mg, Type and screen, Prepare RBC (crossmatch), Prepare Pheresed Platelets, Care order/instruction, Informed Consent Details: Physician/Practitioner Attestation; Transcribe to consent form and obtain patient signature, Transfuse platelet pheresis, Transfuse RBC  Symptomatic anemia - Plan: Informed Consent Details: Physician/Practitioner Attestation; Transcribe to consent form and obtain patient signature, Type and screen, Care order/instruction, 0.9 %  sodium chloride infusion (Manually program via Guardrails IV Fluids), Prepare RBC (crossmatch), Transfuse RBC, Prepare Pheresed Platelets, Transfuse platelet pheresis, acetaminophen (TYLENOL) tablet 650 mg, diphenhydrAMINE (BENADRYL) capsule 25 mg, Type and screen, Prepare RBC (crossmatch), Prepare Pheresed Platelets, Care order/instruction, Informed Consent Details: Physician/Practitioner Attestation; Transcribe to consent form and obtain patient signature, Transfuse  platelet pheresis, Transfuse RBC   Weakness and fatigue: A CBC and CMP were collected today.  Nose bleeds: She was told to hold Xarelto until her return on 07/10/2019. Labs returned today with a CBC showing a WBC of 0.7, hemoglobin 7.1, hematocrit 20.6, platelet count 10 and an ANC of 0.2.  The patient was transfused with 1 unit of packed red blood cells and 1 unit of platelets.  She will return to clinic on 07/10/2019 for repeat labs and additional platelets and blood needed.  Small cell lung cancer: The patient is status post cycle 3, day 5 of cisplatin and etoposide given with pegfilgrastim support.  She is scheduled to return for follow-up on 07/15/2019.  A CBC returned today with a WBC of 0.7 and an ANC of 0.2.  The patient is status post dosing of Fulphila on 06/30/2019.  I am hopeful that her WBC and ANC will recover given that she is day 7 after receiving Fulphila.  She was not covered with antibiotics given that she has been afebrile.  Hypotension suspected secondary to dehydration: The patient was given 1 L of normal saline and 1 unit of packed red blood cells.  Please see After Visit Summary for patient specific instructions.  Future Appointments  Date Time Provider [place]  07/10/2019  9:30 AM [facility] LAB 5 [facility] None  07/10/2019 10:00 AM Allison Deshotels, Lucianne Lei E., PA-C [facility] None  07/16/2019  8:30 AM [facility] LAB 5 [facility] None  07/16/2019  9:00 AM Curt Bears, MD [facility] None  07/16/2019 10:00 AM [facility] INFUSION [facility] None  07/17/2019  8:30 AM [facility] INFUSION [facility] None  07/18/2019  8:30 AM [facility] INFUSION [facility] None  07/21/2019 11:00 AM [facility] LAB 5 [facility] None  07/21/2019 11:30 AM [facility] [place] FLUSH [facility] None  07/22/2019 11:00 AM [facility] LAB [facility] None  07/24/2019 10:15 AM Gery Pray, MD [facility] None  07/25/2019  1:45 PM Elayne Snare, MD [facility] None  07/29/2019 11:45 AM [facility] LAB 2  [facility] None  08/05/2019  8:00 AM [facility] LAB 5 [facility] None  08/05/2019  8:30 AM Heilingoetter, Cassandra L, PA-C [facility] None  08/05/2019  9:30 AM [facility] INFUSION [facility] None  08/06/2019  9:30 AM [facility] INFUSION [facility] None  08/07/2019  8:45 AM [facility] INFUSION [facility] None  08/09/2019  9:15 AM [facility] [place] FLUSH [facility] None    Orders Placed This Encounter  Procedures  . Informed Consent Details: Physician/Practitioner Attestation; Transcribe to consent form and obtain patient signature  . Care order/instruction  . Type and screen  . Prepare RBC (crossmatch)  . Prepare Pheresed Platelets  . ABO/Rh       Subjective:   Patient ID:  Christy Abbott is a 64 y.o. (DOB 03-Jul-1955) female.  Chief Complaint:  Chief Complaint  Patient presents with  . Fatigue    HPI Christy Abbott  is a 64 y.o. female with a diagnosis of small cell lung cancer who is managed by Dr. Julien Nordmann and is status post cycle 3-day 5 of cisplatin and etoposide with pegfilgrastim support.  She completed her most recent cycle of chemotherapy on 06/30/2019.  She presents to the clinic today having last been seen for with nausea, vomiting, diarrhea, weakness, anorexia, mild dizziness, shortness of breath, and dyspnea on exertion on 07/02/2019.  She called our office this morning reporting that she was having severe fatigue & weakness since last week.  Denies N/V/D or dizziness/falls or fever/chills but reports large blood clots when blowing her nose.  She reports that she has been significantly fatigued since her last treatment.  She denies nausea, vomiting, diarrhea, constipation, dizziness, fevers, chills, or sweats.  Medications: I have reviewed the patient's current medications.  Allergies:  Allergies  Allergen Reactions  . Jardiance [Empagliflozin] Palpitations    Causes heart palpitations  . Trazodone And Nefazodone Other (See Comments)    "hyped me up, could not  sleep"    Past Medical History:  Diagnosis Date  . Arthritis   . Atrial flutter [facility])    s/p CTI by Dr Lovena Le 02/2013  . Chest pain   . Diabetes mellitus without complication ([place])   . Dysrhythmia    hx of Aflutter  . 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 Hemochromatosis associated with compound heterozygous mutation in HFE gene ([place]) 12/10/2017  . Hyperlipidemia   . Hypertension   . Hyperthyroidism 10/31/2014    Past Surgical History:  Procedure Laterality Date  . ABLATION  03-04-2013   CTI by Dr Lovena Le for atrial flutter  . ATRIAL FLUTTER ABLATION N/A 03/04/2013   Procedure: ATRIAL FLUTTER ABLATION;  Surgeon: Evans Lance, MD;  Location: [facility] CATH LAB;  Service: Cardiovascular;  Laterality: N/A;  . CHOLECYSTECTOMY  1982  . COLONOSCOPY W/ POLYPECTOMY     x 2  . ESOPHAGOGASTRODUODENOSCOPY N/A 05/20/2014   Procedure: ESOPHAGOGASTRODUODENOSCOPY (EGD);  Surgeon: Teena Irani, MD;  Location: [facility] ENDOSCOPY;  Service: Endoscopy;  Laterality: N/A;  . IR IMAGING GUIDED PORT INSERTION  05/09/2019  . TONSILLECTOMY  1972 ?  Marland Kitchen VIDEO BRONCHOSCOPY WITH ENDOBRONCHIAL ULTRASOUND Left 04/30/2019   Procedure: VIDEO BRONCHOSCOPY WITH ENDOBRONCHIAL ULTRASOUND;  Surgeon: Garner Nash, DO;  Location: [place];  Service: Pulmonary;  Laterality: Left;    Family History  Problem Relation Age of Onset  . Atrial fibrillation Mother   . Arrhythmia Mother   . Diabetes Mother   . Thyroid disease Mother   . Hodgkin's lymphoma Father   . Thyroid disease Daughter  Graves disease  . Diabetes Daughter   . Anxiety disorder Neg Hx     Social History   Socioeconomic History  . Marital status: Married    Spouse name: Not on file  . Number of children: Not on file  . Years of education: Not on file  . Highest education level: Not on file  Occupational History  . Not on file  Tobacco Use  . Smoking status: Former Smoker    Packs/day: 1.00    Types: Cigarettes    Quit date:  03/21/2019    Years since quitting: 0.2  . Smokeless tobacco: Never Used  . Tobacco comment: started age 89 , quit for a year many times  Substance and Sexual Activity  . Alcohol use: No    Alcohol/week: 0.0 standard drinks  . Drug use: No  . Sexual activity: Not on file  Other Topics Concern  . Not on file  Social History Narrative   Married   Clinical cytogeneticist- full time   Daughter- 2 grandchildren, live in [place]   Enjoys playing on computer.     Social Determinants of Health   Financial Resource Strain:   . Difficulty of Paying Living Expenses:   Food Insecurity:   . Worried About Charity fundraiser in the Last Year:   . Arboriculturist in the Last Year:   Transportation Needs:   . Film/video editor (Medical):   Marland Kitchen Lack of Transportation (Non-Medical):   Physical Activity:   . Days of Exercise per Week:   . Minutes of Exercise per Session:   Stress:   . Feeling of Stress :   Social Connections:   . Frequency of Communication with Friends and Family:   . Frequency of Social Gatherings with Friends and Family:   . Attends Religious Services:   . Active Member of Clubs or Organizations:   . Attends Archivist Meetings:   Marland Kitchen Marital Status:   Intimate Partner Violence:   . Fear of Current or Ex-Partner:   . Emotionally Abused:   Marland Kitchen Physically Abused:   . Sexually Abused:     Past Medical History, Surgical history, Social history, and Family history were reviewed and updated as appropriate.   Please see review of systems for further details on the patient's review from today.   Review of Systems:  Review of Systems  Constitutional: Positive for appetite change and fatigue. Negative for chills, diaphoresis and fever.  HENT: Positive for nosebleeds. Negative for trouble swallowing.   Respiratory: Negative for cough, choking, shortness of breath and wheezing.   Cardiovascular: Negative for chest pain and palpitations.  Gastrointestinal: Negative for  constipation, diarrhea, nausea and vomiting.  Genitourinary: Negative for decreased urine volume.  Neurological: Positive for weakness. Negative for dizziness and headaches.    Objective:   Physical Exam:  BP 92/65   Pulse 89   Temp 98 F (36.7 C) (Oral)   Resp 20   Ht 5\' 4"  (1.626 m)   Wt 139 lb 1.6 oz (63.1 kg)   SpO2 100%   BMI 23.88 kg/m  ECOG: 1  Physical Exam Constitutional:      General: She is not in acute distress.    Appearance: She is not diaphoretic.  HENT:     Head: Normocephalic and atraumatic.  Eyes:     General: No scleral icterus.       Right eye: No discharge.        Left eye: No discharge.  Conjunctiva/sclera: Conjunctivae normal.  Cardiovascular:     Rate and Rhythm: Normal rate and regular rhythm.     Heart sounds: Normal heart sounds. No murmur. No friction rub. No gallop.   Pulmonary:     Effort: Pulmonary effort is normal. No respiratory distress.     Breath sounds: Normal breath sounds. No wheezing or rales.  Abdominal:     General: Bowel sounds are normal. There is no distension.     Palpations: Abdomen is soft. There is no mass.     Tenderness: There is no abdominal tenderness. There is no guarding or rebound.  Musculoskeletal:     Cervical back: Normal range of motion and neck supple.  Lymphadenopathy:     Cervical: No cervical adenopathy.  Skin:    General: Skin is warm and dry.     Findings: No erythema or rash.  Neurological:     Mental Status: She is alert.     Coordination: Coordination normal.  Psychiatric:        Behavior: Behavior normal.        Thought Content: Thought content normal.        Judgment: Judgment normal.     Lab Review:     Component Value Date/Time   NA 137 07/07/2019 1321   NA 140 02/21/2018 0955   K 3.9 07/07/2019 1321   CL 95 (L) 07/07/2019 1321   CO2 28 07/07/2019 1321   GLUCOSE 256 (H) 07/07/2019 1321   BUN 26 (H) 07/07/2019 1321   BUN 14 02/21/2018 0955   CREATININE 0.93 07/07/2019 1321     CREATININE 0.82 01/22/2015 1556   CALCIUM 9.3 07/07/2019 1321   PROT 6.6 07/07/2019 1321   PROT 6.8 02/21/2018 0955   ALBUMIN 3.1 (L) 07/07/2019 1321   ALBUMIN 4.3 02/21/2018 0955   AST 9 (L) 07/07/2019 1321   ALT 7 07/07/2019 1321   ALKPHOS 107 07/07/2019 1321   BILITOT 1.0 07/07/2019 1321   GFRNONAA >60 07/07/2019 1321   GFRAA >60 07/07/2019 1321       Component Value Date/Time   WBC 0.7 (LL) 07/07/2019 1321   WBC 16.8 (H) 05/17/2019 1537   RBC 2.33 (L) 07/07/2019 1321   HGB 7.1 (L) 07/07/2019 1321   HGB WILL FOLLOW 02/21/2018 0955   HCT 20.6 (L) 07/07/2019 1321   HCT WILL FOLLOW 02/21/2018 0955   PLT 10 (L) 07/07/2019 1321   PLT WILL FOLLOW 02/21/2018 0955   MCV 88.4 07/07/2019 1321   MCV WILL FOLLOW 02/21/2018 0955   MCH 30.5 07/07/2019 1321   MCHC 34.5 07/07/2019 1321   RDW 14.9 07/07/2019 1321   RDW WILL FOLLOW 02/21/2018 0955   LYMPHSABS 0.4 (L) 07/07/2019 1321   LYMPHSABS WILL FOLLOW 02/21/2018 0955   MONOABS 0.0 (L) 07/07/2019 1321   EOSABS 0.0 07/07/2019 1321   EOSABS WILL FOLLOW 02/21/2018 0955   BASOSABS 0.0 07/07/2019 1321   BASOSABS WILL FOLLOW 02/21/2018 0955   -------------------------------  Imaging from last 24 hours (if applicable):  Radiology interpretation: CT Chest W Contrast  Result Date: 06/24/2019 CLINICAL DATA:  Limited stage small cell left upper lobe carcinoma diagnosed 04/30/2019 with ongoing chemotherapy and radiation therapy. Restaging. EXAM: CT CHEST WITH CONTRAST TECHNIQUE: Multidetector CT imaging of the chest was performed during intravenous contrast administration. CONTRAST:  85mL OMNIPAQUE IOHEXOL 300 MG/ML  SOLN COMPARISON:  04/22/2019 PET-CT. 03/22/2019 chest CT angiogram. FINDINGS: Cardiovascular: Normal heart size. No significant pericardial effusion/thickening. Right internal jugular Port-A-Cath terminates at the cavoatrial junction. Atherosclerotic  nonaneurysmal thoracic aorta. Normal caliber pulmonary arteries. Incidental  acute left-sided pulmonary embolism involving lobar, segmental and subsegmental left lower lobe branches. Mediastinum/Nodes: No discrete thyroid nodules. Unremarkable esophagus. No pathologically enlarged axillary, mediastinal or hilar lymph nodes. Lungs/Pleura: No pneumothorax. No pleural effusion. Mild centrilobular and paraseptal emphysema with mild diffuse bronchial wall thickening. Central left upper lobe 0.5 cm solid pulmonary nodule (series 7/image 66), decreased from 1.0 cm on 03/22/2019 chest CT. Previously visualized dominant central left upper lobe 2.2 cm solid pulmonary nodule on 03/22/2019 chest CT study has resolved. Posterior left lower lobe 0.5 cm solid pulmonary nodule (series 7/image 90) is decreased from 1.8 cm. No acute consolidative airspace disease or new significant pulmonary nodules. Upper abdomen: Cholecystectomy. Musculoskeletal: No aggressive appearing focal osseous lesions. Mild thoracic spondylosis. IMPRESSION: 1. Incidental acute left-sided pulmonary embolism involving lobar, segmental and subsegmental left lower lobe branches. 2. Interval positive response to therapy. The hypermetabolic central left upper lobe 2.2 cm solid pulmonary nodule seen on 04/22/2019 PET-CT study has resolved. Additional smaller left upper lobe and left lower lobe pulmonary nodules are decreased. No new or progressive metastatic disease in the chest. 3. Aortic Atherosclerosis (ICD10-I70.0) and Emphysema (ICD10-J43.9). Critical Value/emergent results were called by telephone at the time of interpretation on 06/24/2019 at 2:21 pm to provider [facility] , who verbally acknowledged these results. Electronically Signed   By: Ilona Sorrel M.D.   On: 06/24/2019 14:27        This case was discussed with Dr. Julien Nordmann. He expressed agreement with my management of this patient.

## 2019-07-08 ENCOUNTER — Other Ambulatory Visit: Payer: Self-pay | Admitting: Emergency Medicine

## 2019-07-08 DIAGNOSIS — C3492 Malignant neoplasm of unspecified part of left bronchus or lung: Secondary | ICD-10-CM

## 2019-07-08 LAB — BPAM RBC
Blood Product Expiration Date: 202105122359
ISSUE DATE / TIME: 202104261614
Unit Type and Rh: 6200

## 2019-07-08 LAB — TYPE AND SCREEN
ABO/RH(D): A POS
Antibody Screen: NEGATIVE
Unit division: 0

## 2019-07-08 LAB — BPAM PLATELET PHERESIS
Blood Product Expiration Date: 202104272359
ISSUE DATE / TIME: 202104261511
Unit Type and Rh: 7300

## 2019-07-08 LAB — PREPARE PLATELET PHERESIS: Unit division: 0

## 2019-07-09 ENCOUNTER — Inpatient Hospital Stay: Payer: 59

## 2019-07-10 ENCOUNTER — Other Ambulatory Visit: Payer: Self-pay

## 2019-07-10 ENCOUNTER — Inpatient Hospital Stay: Payer: 59

## 2019-07-10 ENCOUNTER — Inpatient Hospital Stay (HOSPITAL_BASED_OUTPATIENT_CLINIC_OR_DEPARTMENT_OTHER): Payer: 59 | Admitting: Medical

## 2019-07-10 ENCOUNTER — Other Ambulatory Visit: Payer: Self-pay | Admitting: Interventional Cardiology

## 2019-07-10 VITALS — BP 149/83 | HR 80 | Temp 98.0°F | Resp 18 | Ht 64.0 in | Wt 143.6 lb

## 2019-07-10 DIAGNOSIS — Z5111 Encounter for antineoplastic chemotherapy: Secondary | ICD-10-CM | POA: Diagnosis not present

## 2019-07-10 DIAGNOSIS — D649 Anemia, unspecified: Secondary | ICD-10-CM

## 2019-07-10 DIAGNOSIS — Z95828 Presence of other vascular implants and grafts: Secondary | ICD-10-CM

## 2019-07-10 DIAGNOSIS — D696 Thrombocytopenia, unspecified: Secondary | ICD-10-CM

## 2019-07-10 DIAGNOSIS — C3492 Malignant neoplasm of unspecified part of left bronchus or lung: Secondary | ICD-10-CM

## 2019-07-10 DIAGNOSIS — I950 Idiopathic hypotension: Secondary | ICD-10-CM

## 2019-07-10 LAB — CBC WITH DIFFERENTIAL (CANCER CENTER ONLY)
Abs Immature Granulocytes: 0.02 10*3/uL (ref 0.00–0.07)
Basophils Absolute: 0 10*3/uL (ref 0.0–0.1)
Basophils Relative: 0 %
Eosinophils Absolute: 0 10*3/uL (ref 0.0–0.5)
Eosinophils Relative: 1 %
HCT: 22.6 % — ABNORMAL LOW (ref 36.0–46.0)
Hemoglobin: 7.9 g/dL — ABNORMAL LOW (ref 12.0–15.0)
Immature Granulocytes: 1 %
Lymphocytes Relative: 34 %
Lymphs Abs: 1.2 10*3/uL (ref 0.7–4.0)
MCH: 30.6 pg (ref 26.0–34.0)
MCHC: 35 g/dL (ref 30.0–36.0)
MCV: 87.6 fL (ref 80.0–100.0)
Monocytes Absolute: 0.3 10*3/uL (ref 0.1–1.0)
Monocytes Relative: 7 %
Neutro Abs: 2.1 10*3/uL (ref 1.7–7.7)
Neutrophils Relative %: 57 %
Platelet Count: 8 10*3/uL — CL (ref 150–400)
RBC: 2.58 MIL/uL — ABNORMAL LOW (ref 3.87–5.11)
RDW: 15.5 % (ref 11.5–15.5)
WBC Count: 3.7 10*3/uL — ABNORMAL LOW (ref 4.0–10.5)
nRBC: 0 % (ref 0.0–0.2)

## 2019-07-10 LAB — CMP (CANCER CENTER ONLY)
ALT: 6 U/L (ref 0–44)
AST: 8 U/L — ABNORMAL LOW (ref 15–41)
Albumin: 3 g/dL — ABNORMAL LOW (ref 3.5–5.0)
Alkaline Phosphatase: 107 U/L (ref 38–126)
Anion gap: 10 (ref 5–15)
BUN: 20 mg/dL (ref 8–23)
CO2: 27 mmol/L (ref 22–32)
Calcium: 9.4 mg/dL (ref 8.9–10.3)
Chloride: 102 mmol/L (ref 98–111)
Creatinine: 0.87 mg/dL (ref 0.44–1.00)
GFR, Est AFR Am: >60 mL/min (ref >60)
GFR, Estimated: >60 mL/min (ref >60)
Glucose, Bld: 202 mg/dL — ABNORMAL HIGH (ref 70–99)
Potassium: 4 mmol/L (ref 3.5–5.1)
Sodium: 139 mmol/L (ref 135–145)
Total Bilirubin: 0.4 mg/dL (ref 0.3–1.2)
Total Protein: 6.3 g/dL — ABNORMAL LOW (ref 6.5–8.1)

## 2019-07-10 LAB — PREPARE RBC (CROSSMATCH)

## 2019-07-10 LAB — SAMPLE TO BLOOD BANK

## 2019-07-10 MED ORDER — ACETAMINOPHEN 325 MG PO TABS
ORAL_TABLET | ORAL | Status: AC
  Filled 2019-07-10: qty 2

## 2019-07-10 MED ORDER — HEPARIN SOD (PORK) LOCK FLUSH 100 UNIT/ML IV SOLN
250.0000 [IU] | INTRAVENOUS | Status: AC | PRN
  Administered 2019-07-10: 500 [IU]
  Filled 2019-07-10: qty 5

## 2019-07-10 MED ORDER — SODIUM CHLORIDE 0.9% IV SOLUTION
250.0000 mL | Freq: Once | INTRAVENOUS | Status: AC
  Administered 2019-07-10: 250 mL via INTRAVENOUS
  Filled 2019-07-10: qty 250

## 2019-07-10 MED ORDER — ACETAMINOPHEN 325 MG PO TABS
650.0000 mg | ORAL_TABLET | Freq: Once | ORAL | Status: AC
  Administered 2019-07-10: 650 mg via ORAL

## 2019-07-10 MED ORDER — SODIUM CHLORIDE 0.9% FLUSH
10.0000 mL | Freq: Once | INTRAVENOUS | Status: AC
  Administered 2019-07-10: 10 mL
  Filled 2019-07-10: qty 10

## 2019-07-10 NOTE — Progress Notes (Signed)
Critical lab received 0945: Hgb 7.9 Platelets 8 PA Lucianne Lei made aware.  Pt received 1 unit platelets & 1 unit PRBCs today, tolerated well.  Able to eat/drink & ambulate to restroom during tx w/out any issues.  VSS.  Pt aware to f/u as needed before f/u appts next week and to hold Xarelto for now.

## 2019-07-10 NOTE — Progress Notes (Signed)
The patient was seen in the clinic today after receiving 1 unit of packed red blood cells and 1 unit of platelets on 07/07/2019.  A CBC returned today with a hemoglobin of 7.9 and a platelet count of 8.  Her WBC returned at 3.7 which was up from 0.7 when last checked on 07/07/2019.  The patient's labs were reviewed with Dr. Julien Nordmann.  She received 1 unit of packed red blood cells today and 1 unit of platelets.  She has had no more episodes of nosebleeds.  She was told to continue to hold her Xarelto until her appointment with Dr. Julien Nordmann on 07/16/2019.  She knows to call should she have recurrent nosebleeds.  Sandi Mealy, MHS, PA-C Physician Assistant

## 2019-07-10 NOTE — Patient Instructions (Signed)
Blood Transfusion, Adult A blood transfusion is a procedure in which you receive blood or a type of blood cell (blood component) through an IV. You may need a blood transfusion when your blood level is low. This may result from a bleeding disorder, illness, injury, or surgery. The blood may come from a donor. You may also be able to donate blood for yourself (autologous blood donation) before a planned surgery. The blood given in a transfusion is made up of different blood components. You may receive:  Red blood cells. These carry oxygen to the cells in the body.  Platelets. These help your blood to clot.  Plasma. This is the liquid part of your blood. It carries proteins and other substances throughout the body.  White blood cells. These help you fight infections. If you have hemophilia or another clotting disorder, you may also receive other types of blood products. Tell a health care provider about:  Any blood disorders you have.  Any previous reactions you have had during a blood transfusion.  Any allergies you have.  All medicines you are taking, including vitamins, herbs, eye drops, creams, and over-the-counter medicines.  Any surgeries you have had.  Any medical conditions you have, including any recent fever or cold symptoms.  Whether you are pregnant or may be pregnant. What are the risks? Generally, this is a safe procedure. However, problems may occur.  The most common problems include: ? A mild allergic reaction, such as red, swollen areas of skin (hives) and itching. ? Fever or chills. This may be the body's response to new blood cells received. This may occur during or up to 4 hours after the transfusion.  More serious problems may include: ? Transfusion-associated circulatory overload (TACO), or too much fluid in the lungs. This may cause breathing problems. ? A serious allergic reaction, such as difficulty breathing or swelling around the face and  lips. ? Transfusion-related acute lung injury (TRALI), which causes breathing difficulty and low oxygen in the blood. This can occur within hours of the transfusion or several days later. ? Iron overload. This can happen after receiving many blood transfusions over a period of time. ? Infection or virus being transmitted. This is rare because donated blood is carefully tested before it is given. ? Hemolytic transfusion reaction. This is rare. It happens when your body's defense system (immune system)tries to attack the new blood cells. Symptoms may include fever, chills, nausea, low blood pressure, and low back or chest pain. ? Transfusion-associated graft-versus-host disease (TAGVHD). This is rare. It happens when donated cells attack your body's healthy tissues. What happens before the procedure? Medicines Ask your health care provider about:  Changing or stopping your regular medicines. This is especially important if you are taking diabetes medicines or blood thinners.  Taking medicines such as aspirin and ibuprofen. These medicines can thin your blood. Do not take these medicines unless your health care provider tells you to take them.  Taking over-the-counter medicines, vitamins, herbs, and supplements. General instructions  Follow instructions from your health care provider about eating and drinking restrictions.  You will have a blood test to determine your blood type. This is necessary to know what kind of blood your body will accept and to match it to the donor blood.  If you are going to have a planned surgery, you may be able to do an autologous blood donation. This may be done in case you need to have a transfusion.  You will have your temperature,  blood pressure, and pulse monitored before the transfusion.  If you have had an allergic reaction to a transfusion in the past, you may be given medicine to help prevent a reaction. This medicine may be given to you by mouth (orally)  or through an IV.  Set aside time for the blood transfusion. This procedure generally takes 1-4 hours to complete. What happens during the procedure?   An IV will be inserted into one of your veins.  The bag of donated blood will be attached to your IV. The blood will then enter through your vein.  Your temperature, blood pressure, and pulse will be monitored regularly during the transfusion. This monitoring is done to detect early signs of a transfusion reaction.  Tell your nurse right away if you have any of these symptoms during the transfusion: ? Shortness of breath or trouble breathing. ? Chest or back pain. ? Fever or chills. ? Hives or itching.  If you have any signs or symptoms of a reaction, your transfusion will be stopped and you may be given medicine.  When the transfusion is complete, your IV will be removed.  Pressure may be applied to the IV site for a few minutes.  A bandage (dressing)will be applied. The procedure may vary among health care providers and hospitals. What happens after the procedure?  Your temperature, blood pressure, pulse, breathing rate, and blood oxygen level will be monitored until you leave the hospital or clinic.  Your blood may be tested to see how you are responding to the transfusion.  You may be warmed with fluids or blankets to maintain a normal body temperature.  If you receive your blood transfusion in an outpatient setting, you will be told whom to contact to report any reactions. Where to find more information For more information on blood transfusions, visit the [employer]: redcross.org Summary  A blood transfusion is a procedure in which you receive blood or a type of blood cell (blood component) through an IV.  The blood you receive may come from a donor or be donated by yourself (autologous blood donation) before a planned surgery.  The blood given in a transfusion is made up of different blood components. You may  receive red blood cells, platelets, plasma, or white blood cells depending on the condition treated.  Your temperature, blood pressure, and pulse will be monitored before, during, and after the transfusion.  After the transfusion, your blood may be tested to see how your body has responded. This information is not intended to replace advice given to you by your health care provider. Make sure you discuss any questions you have with your health care provider. Document Revised: 08/22/2018 Document Reviewed: 08/22/2018 Elsevier Patient Education  [place].   Platelet Transfusion A platelet transfusion is a procedure in which you receive donated platelets through an IV. Platelets are tiny pieces of blood cells. When you get an injury, platelets clump together in the area to form a blood clot. This helps stop bleeding and is the beginning of the healing process. If you have too few platelets, your blood may have trouble clotting. This may cause you to bleed and bruise very easily. You may need a platelet transfusion if you have a condition that causes a low number of platelets (thrombocytopenia). A platelet transfusion may be used to stop or prevent excessive bleeding. Tell a health care provider about:  Any reactions you have had during previous transfusions.  Any allergies you have.  All medicines you are taking, including vitamins, herbs, eye drops, creams, and over-the-counter medicines.  Any blood disorders you have.  Any surgeries you have had.  Any medical conditions you have.  Whether you are pregnant or may be pregnant. What are the risks? Generally, this is a safe procedure. However, problems may occur, including:  Fever.  Infection.  Allergic reaction to the donor platelets.  Your body's disease-fighting system (immune system) attacking the donor platelets (hemolytic reaction). This is rare.  A rare reaction that causes lung damage (transfusion-related acute  lung injury). What happens before the procedure? Medicines  Ask your health care provider about: ? Changing or stopping your regular medicines. This is especially important if you are taking diabetes medicines or blood thinners. ? Taking medicines such as aspirin and ibuprofen. These medicines can thin your blood. Do not take these medicines unless your health care provider tells you to take them. ? Taking over-the-counter medicines, vitamins, herbs, and supplements. General instructions  You will have a blood test to determine your blood type. Your blood type determines what kind of platelets you will be given.  Follow instructions from your health care provider about eating or drinking restrictions.  If you have had an allergic reaction to a transfusion in the past, you may be given medicine to help prevent a reaction.  Your temperature, blood pressure, pulse, and breathing will be monitored. What happens during the procedure?   An IV will be inserted into one of your veins.  For your safety, two health care providers will verify your identity along with the donor platelets about to be infused.  A bag of donor platelets will be connected to your IV. The platelets will flow into your bloodstream. This usually takes 30-60 minutes.  Your temperature, blood pressure, pulse, and breathing will be monitored during the transfusion. This helps detect early signs of any reaction.  You will also be monitored for other symptoms that may indicate a reaction, including chills, hives, or itching.  If you have signs of a reaction at any time, your transfusion will be stopped, and you may be given medicine to help manage the reaction.  When your transfusion is complete, your IV will be removed.  Pressure may be applied to the IV site for a few minutes to stop any bleeding.  The IV site will be covered with a bandage (dressing). The procedure may vary among health care providers and  hospitals. What happens after the procedure?  Your blood pressure, temperature, pulse, and breathing will be monitored until you leave the hospital or clinic.  You may have some bruising and soreness at your IV site. Follow these instructions at home: Medicines  Take over-the-counter and prescription medicines only as told by your health care provider.  Talk with your health care provider before you take any medicines that contain aspirin or NSAIDs. These medicines increase your risk for dangerous bleeding. General instructions  Change or remove your dressing as told by your health care provider.  Return to your normal activities as told by your health care provider. Ask your health care provider what activities are safe for you.  Do not take baths, swim, or use a hot tub until your health care provider approves. Ask your health care provider if you may take showers.  Check your IV site every day for signs of infection. Check for: ? Redness, swelling, or pain. ? Fluid or blood. If fluid or blood drains from your IV site, use your hands  to press down firmly on a bandage covering the area for a minute or two. Doing this should stop the bleeding. ? Warmth. ? Pus or a bad smell.  Keep all follow-up visits as told by your health care provider. This is important. Contact a health care provider if you have:  A headache that does not go away with medicine.  Hives, rash, or itchy skin.  Nausea or vomiting.  Unusual tiredness or weakness.  Signs of infection at your IV site. Get help right away if:  You have a fever or chills.  You urinate less often than usual.  Your urine is darker colored than normal.  You have any of the following: ? Trouble breathing. ? Pain in your back, abdomen, or chest. ? Cool, clammy skin. ? A fast heartbeat. Summary  Platelets are tiny pieces of blood cells that clump together to form a blood clot when you have an injury. If you have too few  platelets, your blood may have trouble clotting.  A platelet transfusion is a procedure in which you receive donated platelets through an IV.  A platelet transfusion may be used to stop or prevent excessive bleeding.  After the procedure, check your IV site every day for signs of infection, including redness, swelling, pain, or warmth. This information is not intended to replace advice given to you by your health care provider. Make sure you discuss any questions you have with your health care provider. Document Revised: 04/04/2017 Document Reviewed: 04/04/2017 Elsevier Patient Education  2020 [employer].

## 2019-07-10 NOTE — Progress Notes (Signed)
Pharmacist Chemotherapy Monitoring - Follow Up Assessment    I verify that I have reviewed each item in the below checklist:  . Regimen for the patient is scheduled for the appropriate day and plan matches scheduled date. Marland Kitchen Appropriate non-routine labs are ordered dependent on drug ordered. . If applicable, additional medications reviewed and ordered per protocol based on lifetime cumulative doses and/or treatment regimen.   Plan for follow-up and/or issues identified: No . I-vent associated with next due treatment: No . MD and/or nursing notified: No  Christy Abbott K 07/10/2019 10:50 AM

## 2019-07-11 ENCOUNTER — Other Ambulatory Visit: Payer: Self-pay | Admitting: Family

## 2019-07-11 DIAGNOSIS — E876 Hypokalemia: Secondary | ICD-10-CM

## 2019-07-11 NOTE — Progress Notes (Signed)
Pharmacist Chemotherapy Monitoring - Follow Up Assessment    I verify that I have reviewed each item in the below checklist:  . Regimen for the patient is scheduled for the appropriate day and plan matches scheduled date. Marland Kitchen Appropriate non-routine labs are ordered dependent on drug ordered. . If applicable, additional medications reviewed and ordered per protocol based on lifetime cumulative doses and/or treatment regimen.   Plan for follow-up and/or issues identified: No . I-vent associated with next due treatment: No . MD and/or nursing notified: No  Christy Abbott [facility] 07/11/2019 2:49 PM

## 2019-07-12 LAB — BPAM PLATELET PHERESIS
Blood Product Expiration Date: 202104301014
ISSUE DATE / TIME: 202104291050
Unit Type and Rh: 8400

## 2019-07-12 LAB — TYPE AND SCREEN
ABO/RH(D): A POS
Antibody Screen: NEGATIVE
Unit division: 0

## 2019-07-12 LAB — BPAM RBC
Blood Product Expiration Date: 202105122359
ISSUE DATE / TIME: 202104291147
Unit Type and Rh: 6200

## 2019-07-12 LAB — PREPARE PLATELET PHERESIS: Unit division: 0

## 2019-07-14 ENCOUNTER — Encounter: Payer: Self-pay | Admitting: Family

## 2019-07-14 ENCOUNTER — Other Ambulatory Visit: Payer: Self-pay

## 2019-07-14 ENCOUNTER — Other Ambulatory Visit (INDEPENDENT_AMBULATORY_CARE_PROVIDER_SITE_OTHER): Payer: 59

## 2019-07-14 DIAGNOSIS — R300 Dysuria: Secondary | ICD-10-CM | POA: Diagnosis not present

## 2019-07-14 LAB — POC URINALSYSI DIPSTICK (AUTOMATED)
Bilirubin, UA: NEGATIVE
Glucose, UA: NEGATIVE
Ketones, UA: NEGATIVE
Nitrite, UA: POSITIVE
Protein, UA: POSITIVE — AB
Spec Grav, UA: >=1.03 — AB (ref 1.010–1.025)
Urobilinogen, UA: 0.2 E.U./dL
pH, UA: 6 (ref 5.0–8.0)

## 2019-07-14 MED ORDER — CEPHALEXIN 500 MG PO CAPS
500.0000 mg | ORAL_CAPSULE | Freq: Three times a day (TID) | ORAL | 0 refills | Status: DC
Start: 2019-07-14 — End: 2019-08-13

## 2019-07-14 NOTE — Progress Notes (Signed)
Pharmacist Chemotherapy Monitoring - Follow Up Assessment    I verify that I have reviewed each item in the below checklist:  . Regimen for the patient is scheduled for the appropriate day and plan matches scheduled date. Marland Kitchen Appropriate non-routine labs are ordered dependent on drug ordered. . If applicable, additional medications reviewed and ordered per protocol based on lifetime cumulative doses and/or treatment regimen.   Plan for follow-up and/or issues identified: No . I-vent associated with next due treatment: No . MD and/or nursing notified: No  Alayah Knouff K 07/14/2019 10:55 AM

## 2019-07-16 ENCOUNTER — Other Ambulatory Visit: Payer: Self-pay

## 2019-07-16 ENCOUNTER — Encounter: Payer: Self-pay | Admitting: Internal Medicine

## 2019-07-16 ENCOUNTER — Inpatient Hospital Stay: Payer: 59

## 2019-07-16 ENCOUNTER — Inpatient Hospital Stay: Payer: 59 | Attending: Internal Medicine | Admitting: Internal Medicine

## 2019-07-16 ENCOUNTER — Encounter: Payer: Self-pay | Admitting: Family

## 2019-07-16 ENCOUNTER — Telehealth: Payer: Self-pay | Admitting: Internal Medicine

## 2019-07-16 VITALS — BP 108/73 | HR 91 | Temp 97.8°F | Resp 18 | Ht 64.0 in | Wt 141.3 lb

## 2019-07-16 DIAGNOSIS — I10 Essential (primary) hypertension: Secondary | ICD-10-CM | POA: Insufficient documentation

## 2019-07-16 DIAGNOSIS — C3412 Malignant neoplasm of upper lobe, left bronchus or lung: Secondary | ICD-10-CM | POA: Diagnosis present

## 2019-07-16 DIAGNOSIS — Z79899 Other long term (current) drug therapy: Secondary | ICD-10-CM | POA: Insufficient documentation

## 2019-07-16 DIAGNOSIS — C3490 Malignant neoplasm of unspecified part of unspecified bronchus or lung: Secondary | ICD-10-CM | POA: Diagnosis not present

## 2019-07-16 DIAGNOSIS — R112 Nausea with vomiting, unspecified: Secondary | ICD-10-CM | POA: Insufficient documentation

## 2019-07-16 DIAGNOSIS — E119 Type 2 diabetes mellitus without complications: Secondary | ICD-10-CM | POA: Diagnosis not present

## 2019-07-16 DIAGNOSIS — Z86711 Personal history of pulmonary embolism: Secondary | ICD-10-CM | POA: Diagnosis not present

## 2019-07-16 DIAGNOSIS — I2699 Other pulmonary embolism without acute cor pulmonale: Secondary | ICD-10-CM | POA: Insufficient documentation

## 2019-07-16 DIAGNOSIS — I959 Hypotension, unspecified: Secondary | ICD-10-CM | POA: Diagnosis not present

## 2019-07-16 DIAGNOSIS — E785 Hyperlipidemia, unspecified: Secondary | ICD-10-CM | POA: Insufficient documentation

## 2019-07-16 DIAGNOSIS — Z9221 Personal history of antineoplastic chemotherapy: Secondary | ICD-10-CM | POA: Diagnosis not present

## 2019-07-16 DIAGNOSIS — Z923 Personal history of irradiation: Secondary | ICD-10-CM | POA: Diagnosis not present

## 2019-07-16 DIAGNOSIS — Z794 Long term (current) use of insulin: Secondary | ICD-10-CM | POA: Insufficient documentation

## 2019-07-16 DIAGNOSIS — Z5111 Encounter for antineoplastic chemotherapy: Secondary | ICD-10-CM | POA: Diagnosis not present

## 2019-07-16 DIAGNOSIS — Z7901 Long term (current) use of anticoagulants: Secondary | ICD-10-CM | POA: Insufficient documentation

## 2019-07-16 DIAGNOSIS — J439 Emphysema, unspecified: Secondary | ICD-10-CM | POA: Diagnosis not present

## 2019-07-16 DIAGNOSIS — D61818 Other pancytopenia: Secondary | ICD-10-CM | POA: Diagnosis not present

## 2019-07-16 DIAGNOSIS — C3492 Malignant neoplasm of unspecified part of left bronchus or lung: Secondary | ICD-10-CM

## 2019-07-16 DIAGNOSIS — Z5189 Encounter for other specified aftercare: Secondary | ICD-10-CM | POA: Insufficient documentation

## 2019-07-16 LAB — CBC WITH DIFFERENTIAL (CANCER CENTER ONLY)
Abs Immature Granulocytes: 0.03 10*3/uL (ref 0.00–0.07)
Basophils Absolute: 0 10*3/uL (ref 0.0–0.1)
Basophils Relative: 0 %
Eosinophils Absolute: 0 10*3/uL (ref 0.0–0.5)
Eosinophils Relative: 1 %
HCT: 28 % — ABNORMAL LOW (ref 36.0–46.0)
Hemoglobin: 9.6 g/dL — ABNORMAL LOW (ref 12.0–15.0)
Immature Granulocytes: 1 %
Lymphocytes Relative: 18 %
Lymphs Abs: 1.1 10*3/uL (ref 0.7–4.0)
MCH: 31.1 pg (ref 26.0–34.0)
MCHC: 34.3 g/dL (ref 30.0–36.0)
MCV: 90.6 fL (ref 80.0–100.0)
Monocytes Absolute: 0.5 10*3/uL (ref 0.1–1.0)
Monocytes Relative: 8 %
Neutro Abs: 4.5 10*3/uL (ref 1.7–7.7)
Neutrophils Relative %: 72 %
Platelet Count: 39 10*3/uL — ABNORMAL LOW (ref 150–400)
RBC: 3.09 MIL/uL — ABNORMAL LOW (ref 3.87–5.11)
RDW: 15.5 % (ref 11.5–15.5)
WBC Count: 6.2 10*3/uL (ref 4.0–10.5)
nRBC: 0 % (ref 0.0–0.2)

## 2019-07-16 LAB — CMP (CANCER CENTER ONLY)
ALT: <6 U/L (ref 0–44)
AST: 8 U/L — ABNORMAL LOW (ref 15–41)
Albumin: 3.1 g/dL — ABNORMAL LOW (ref 3.5–5.0)
Alkaline Phosphatase: 117 U/L (ref 38–126)
Anion gap: 11 (ref 5–15)
BUN: 22 mg/dL (ref 8–23)
CO2: 28 mmol/L (ref 22–32)
Calcium: 9.2 mg/dL (ref 8.9–10.3)
Chloride: 101 mmol/L (ref 98–111)
Creatinine: 1.01 mg/dL — ABNORMAL HIGH (ref 0.44–1.00)
GFR, Est AFR Am: >60 mL/min (ref >60)
GFR, Estimated: 59 mL/min — ABNORMAL LOW (ref >60)
Glucose, Bld: 193 mg/dL — ABNORMAL HIGH (ref 70–99)
Potassium: 3.8 mmol/L (ref 3.5–5.1)
Sodium: 140 mmol/L (ref 135–145)
Total Bilirubin: 0.5 mg/dL (ref 0.3–1.2)
Total Protein: 6.4 g/dL — ABNORMAL LOW (ref 6.5–8.1)

## 2019-07-16 LAB — URINE CULTURE
MICRO NUMBER:: 10431797
SPECIMEN QUALITY:: ADEQUATE

## 2019-07-16 LAB — MAGNESIUM: Magnesium: 1.5 mg/dL — ABNORMAL LOW (ref 1.7–2.4)

## 2019-07-16 NOTE — Progress Notes (Signed)
[place] Telephone:([phone]) [phone]   Fax:(336) [phone]  OFFICE PROGRESS NOTE  [name], NP [place] [address]  DIAGNOSIS:  1) limited stage (T2a, N1, M0) small cell lung cancer presented with right hilar mass in addition to right hilar adenopathy diagnosed in February 2021. 2) incidental acute left-sided pulmonary embolism involving lobar, segmental and subsegmental left lower lobe branches diagnosed in April 2021.  PRIOR THERAPY: None.  CURRENT THERAPY:  1) Systemic chemotherapy with cisplatin 80 mg/M2 on day 1 and etoposide 100 mg/M2 on days 1, 2 and 3 every 3 weeks.  Status post 3 cycles.  This is concurrent with radiotherapy. 2) Xarelto 15 mg p.o. twice daily for the first 3 weeks followed by 20 mg p.o. daily.  Started June 24 2019.  INTERVAL HISTORY: Christy Abbott 64 y.o. female returns to the clinic today for follow-up visit.  The patient is feeling fine today except for the fatigue.  She was also diagnosed with urinary tract infection yesterday and started on treatment with cephalexin by her primary care physician.  The patient denied having any current chest pain, shortness of breath, cough or hemoptysis.  She denied having any nausea, vomiting, diarrhea or constipation.  She has no headache or visual changes.  Her treatment has been complicated with severe pancytopenia.  She received 2 units of PRBCs transfusion last week.  Her platelet count was also low and she received platelet transfusion. The patient is here today for evaluation before starting cycle #4.   MEDICAL HISTORY: Past Medical History:  Diagnosis Date  . Arthritis   . Atrial flutter [facility])    s/p CTI by Dr Lovena Le 02/2013  . Chest pain   . Diabetes mellitus without complication ([place])   . Dysrhythmia    hx of Aflutter  . 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 Hemochromatosis associated with compound heterozygous mutation in  HFE gene ([place]) 12/10/2017  . Hyperlipidemia   . Hypertension   . Hyperthyroidism 10/31/2014    ALLERGIES:  is allergic to jardiance [empagliflozin] and trazodone and nefazodone.  MEDICATIONS:  Current Outpatient Medications  Medication Sig Dispense Refill  . albuterol (VENTOLIN HFA) 108 (90 Base) MCG/ACT inhaler Inhale 2 puffs into the lungs every 6 (six) hours as needed for wheezing or shortness of breath. 6.7 g 0  . Bioflavonoid Products (ESTER C PO) Take 1 tablet by mouth daily.    . Calcium Carb-Cholecalciferol (CALTRATE 600+D3 PO) Take 1 tablet by mouth daily.    . carvedilol (COREG) 12.5 MG tablet Take 1 tablet (12.5 mg total) by mouth in the morning and at bedtime. 180 tablet 3  . cephALEXin (KEFLEX) 500 MG capsule Take 1 capsule (500 mg total) by mouth 3 (three) times daily. 15 capsule 0  . Continuous Blood Gluc Sensor (FREESTYLE LIBRE 2 SENSOR) MISC 2 Devices by Does not apply route every 14 (fourteen) days. 2 each 3  . diphenhydramine-acetaminophen (TYLENOL PM) 25-500 MG TABS tablet Take 2 tablets by mouth at bedtime.    . flecainide (TAMBOCOR) 50 MG tablet TAKE 1 TABLET BY MOUTH TWICE DAILY, MAY TAKE AN ADDITIONAL 1 TABLET AS NEEDED. Please keep upcoming appt in January. Thank you (Patient taking differently: Take 50 mg by mouth as directed. Take 50 mg by mouth twice daily, may take a third 50 mg dose as needed for palpitations) 270 tablet 0  . furosemide (LASIX) 20 MG tablet Take 1  tablet (20 mg total) by mouth daily as needed. (Patient taking differently: Take 20 mg by mouth daily as needed. fluid) 60 tablet 0  . guaiFENesin-dextromethorphan (ROBITUSSIN DM) 100-10 MG/5ML syrup Take 5 mLs by mouth every 4 (four) hours as needed for cough. 118 mL 0  . insulin aspart (NOVOLOG) 100 UNIT/ML FlexPen Inject subcutaneously 3 times daily prior to meals per sliding scale 15 mL 1  . insulin glargine (LANTUS) 100 unit/mL SOPN Inject 0.1 mLs (10 Units total) into the skin daily. (Patient taking  differently: Inject 10 Units into the skin at bedtime. ) 15 mL 2  . Insulin Pen Needle (B-D UF III MINI PEN NEEDLES) 31G X 5 MM MISC Use with insulin pen 100 each 3  . Lancets 30G MISC Check sugar twice daily. 100 each 3  . lidocaine-prilocaine (EMLA) cream Apply 1 application topically as needed. Using a cotton ball , apply a small amount of cream to the skin over the porta cath site. Do not rub in the cream. Cover with Plastic wrap. 30 g 0  . lisinopril-hydrochlorothiazide (PRINZIDE,ZESTORETIC) 20-25 MG tablet Take 1 tablet by mouth daily. 90 tablet 1  . LORazepam (ATIVAN) 0.5 MG tablet Place 1 tablet (0.5 mg total) under the tongue every 8 (eight) hours as needed (nausea). 30 tablet 0  . magnesium oxide (MAG-OX) 400 (241.3 Mg) MG tablet Take 1 tablet (400 mg total) by mouth 3 (three) times daily. 90 tablet 0  . metFORMIN (GLUCOPHAGE) 500 MG tablet TAKE 2 TABLETS BY MOUTH TWICE DAILY . APPOINTMENT REQUIRED FOR FUTURE REFILLS 120 tablet 0  . methimazole (TAPAZOLE) 10 MG tablet Take 1 tablet (10 mg total) by mouth daily. 90 tablet 0  . Omega-3 Fatty Acids (FISH OIL) 1200 MG CAPS Take 1,200 mg by mouth daily.     . ondansetron (ZOFRAN) 4 MG tablet Take 1 tablet (4 mg total) by mouth every 8 (eight) hours as needed for nausea or vomiting. 20 tablet 0  . potassium chloride SA (KLOR-CON) 20 MEQ tablet TAKE 1  BY MOUTH ONCE DAILY 90 tablet 0  . prochlorperazine (COMPAZINE) 10 MG tablet Take 1 tablet (10 mg total) by mouth every 6 (six) hours as needed for nausea or vomiting. 30 tablet 0  . Rivaroxaban Stater Pack, 15 mg and 20 mg, (XARELTO STARTER PACK) Follow package directions: Take one 15mg  tablet by mouth twice a day. On day 22, switch to one 20mg  tablet once a day. Take with food. 51 each 0  . simvastatin (ZOCOR) 10 MG tablet TAKE 1 TABLET BY MOUTH AT BEDTIME 90 tablet 1  . Tetrahydrozoline HCl (VISINE OP) Place 1 drop into both eyes daily as needed (irritation).     No current facility-administered  medications for this visit.    SURGICAL HISTORY:  Past Surgical History:  Procedure Laterality Date  . ABLATION  03-04-2013   CTI by Dr Lovena Le for atrial flutter  . ATRIAL FLUTTER ABLATION N/A 03/04/2013   Procedure: ATRIAL FLUTTER ABLATION;  Surgeon: Evans Lance, MD;  Location: [facility] CATH LAB;  Service: Cardiovascular;  Laterality: N/A;  . CHOLECYSTECTOMY  1982  . COLONOSCOPY W/ POLYPECTOMY     x 2  . ESOPHAGOGASTRODUODENOSCOPY N/A 05/20/2014   Procedure: ESOPHAGOGASTRODUODENOSCOPY (EGD);  Surgeon: Teena Irani, MD;  Location: [facility] ENDOSCOPY;  Service: Endoscopy;  Laterality: N/A;  . IR IMAGING GUIDED PORT INSERTION  05/09/2019  . TONSILLECTOMY  1972 ?  Marland Kitchen VIDEO BRONCHOSCOPY WITH ENDOBRONCHIAL ULTRASOUND Left 04/30/2019   Procedure: VIDEO BRONCHOSCOPY WITH  ENDOBRONCHIAL ULTRASOUND;  Surgeon: Garner Nash, DO;  Location: [facility];  Service: Pulmonary;  Laterality: Left;    REVIEW OF SYSTEMS:  Constitutional: positive for fatigue Eyes: negative Ears, nose, mouth, throat, and face: negative Respiratory: negative Cardiovascular: negative Gastrointestinal: negative Genitourinary:negative Integument/breast: negative Hematologic/lymphatic: negative Musculoskeletal:negative Neurological: negative Behavioral/Psych: negative Endocrine: negative Allergic/Immunologic: negative   PHYSICAL EXAMINATION: General appearance: alert, cooperative, fatigued and no distress Head: Normocephalic, without obvious abnormality, atraumatic Neck: no adenopathy, no JVD, supple, symmetrical, trachea midline and thyroid not enlarged, symmetric, no tenderness/mass/nodules Lymph nodes: Cervical, supraclavicular, and axillary nodes normal. Resp: clear to auscultation bilaterally Back: symmetric, no curvature. ROM normal. No CVA tenderness. Cardio: regular rate and rhythm, S1, S2 normal, no murmur, click, rub or gallop GI: soft, non-tender; bowel sounds normal; no masses,  no organomegaly Extremities: extremities  normal, atraumatic, no cyanosis or edema Neurologic: Alert and oriented X 3, normal strength and tone. Normal symmetric reflexes. Normal coordination and gait  ECOG PERFORMANCE STATUS: 1 - Symptomatic but completely ambulatory  Blood pressure 108/73, pulse 91, temperature 97.8 F (36.6 C), temperature source Oral, resp. rate 18, height 5\' 4"  (1.626 m), weight 141 lb 4.8 oz (64.1 kg), SpO2 98 %.  LABORATORY DATA: Lab Results  Component Value Date   WBC 6.2 07/16/2019   HGB 9.6 (L) 07/16/2019   HCT 28.0 (L) 07/16/2019   MCV 90.6 07/16/2019   PLT 39 (L) 07/16/2019      Chemistry      Component Value Date/Time   NA 139 07/10/2019 0931   NA 140 02/21/2018 0955   K 4.0 07/10/2019 0931   CL 102 07/10/2019 0931   CO2 27 07/10/2019 0931   BUN 20 07/10/2019 0931   BUN 14 02/21/2018 0955   CREATININE 0.87 07/10/2019 0931   CREATININE 0.82 01/22/2015 1556      Component Value Date/Time   CALCIUM 9.4 07/10/2019 0931   ALKPHOS 107 07/10/2019 0931   AST 8 (L) 07/10/2019 0931   ALT 6 07/10/2019 0931   BILITOT 0.4 07/10/2019 0931       RADIOGRAPHIC STUDIES: CT Chest W Contrast  Result Date: 06/24/2019 CLINICAL DATA:  Limited stage small cell left upper lobe carcinoma diagnosed 04/30/2019 with ongoing chemotherapy and radiation therapy. Restaging. EXAM: CT CHEST WITH CONTRAST TECHNIQUE: Multidetector CT imaging of the chest was performed during intravenous contrast administration. CONTRAST:  85mL OMNIPAQUE IOHEXOL 300 MG/ML  SOLN COMPARISON:  04/22/2019 PET-CT. 03/22/2019 chest CT angiogram. FINDINGS: Cardiovascular: Normal heart size. No significant pericardial effusion/thickening. Right internal jugular Port-A-Cath terminates at the cavoatrial junction. Atherosclerotic nonaneurysmal thoracic aorta. Normal caliber pulmonary arteries. Incidental acute left-sided pulmonary embolism involving lobar, segmental and subsegmental left lower lobe branches. Mediastinum/Nodes: No discrete thyroid  nodules. Unremarkable esophagus. No pathologically enlarged axillary, mediastinal or hilar lymph nodes. Lungs/Pleura: No pneumothorax. No pleural effusion. Mild centrilobular and paraseptal emphysema with mild diffuse bronchial wall thickening. Central left upper lobe 0.5 cm solid pulmonary nodule (series 7/image 66), decreased from 1.0 cm on 03/22/2019 chest CT. Previously visualized dominant central left upper lobe 2.2 cm solid pulmonary nodule on 03/22/2019 chest CT study has resolved. Posterior left lower lobe 0.5 cm solid pulmonary nodule (series 7/image 90) is decreased from 1.8 cm. No acute consolidative airspace disease or new significant pulmonary nodules. Upper abdomen: Cholecystectomy. Musculoskeletal: No aggressive appearing focal osseous lesions. Mild thoracic spondylosis. IMPRESSION: 1. Incidental acute left-sided pulmonary embolism involving lobar, segmental and subsegmental left lower lobe branches. 2. Interval positive response to therapy. The hypermetabolic central  left upper lobe 2.2 cm solid pulmonary nodule seen on 04/22/2019 PET-CT study has resolved. Additional smaller left upper lobe and left lower lobe pulmonary nodules are decreased. No new or progressive metastatic disease in the chest. 3. Aortic Atherosclerosis (ICD10-I70.0) and Emphysema (ICD10-J43.9). Critical Value/emergent results were called by telephone at the time of interpretation on 06/24/2019 at 2:21 pm to provider [facility] , who verbally acknowledged these results. Electronically Signed   By: Ilona Sorrel M.D.   On: 06/24/2019 14:27    ASSESSMENT AND PLAN: This is a very pleasant 64 years old white female with limited stage small cell lung cancer and currently undergoing systemic chemotherapy with cisplatin and etoposide concurrent with radiation status post 3 cycles. The patient has a rough time with her chemotherapy with significant pancytopenia and fatigue.  She will require PRBCs transfusion as well as platelet  transfusion after the last cycle. CBC today showed persistent thrombocytopenia. I recommended for the patient to delay the start of cycle number 4 by 1 week until improvement of her platelets count.  I will also reduce the dose of cisplatin to 60 mg/M2 on day 1 and 2 etoposide to 80 mg/M2 on days 1, 2 and 3 starting from cycle #4. I will see the patient back for follow-up visit in 4 weeks for evaluation with repeat CT scan of the chest for restaging of her disease. For the recently diagnosed incidental pulmonary embolism of the left lower lobe pulmonary arteries, we started the patient on Xarelto 15 mg p.o. twice daily for 3 weeks followed by 20 mg p.o. daily. The patient was advised to call immediately if she has any concerning symptoms in the interval. The patient voices understanding of current disease status and treatment options and is in agreement with the current care plan.  All questions were answered. The patient knows to call the clinic with any problems, questions or concerns. We can certainly see the patient much sooner if necessary.   Disclaimer: This note was dictated with voice recognition software. Similar sounding words can inadvertently be transcribed and may not be corrected upon review.

## 2019-07-16 NOTE — Telephone Encounter (Signed)
Scheduled appt per 5/5 los - pt aware of appts - my chart active.   Waiting on approval to add infusion for 5/13 , will call pt when added.

## 2019-07-17 ENCOUNTER — Inpatient Hospital Stay: Payer: 59

## 2019-07-17 LAB — LIPID PANEL W/O CHOL/HDL RATIO
Cholesterol, Total: 80 mg/dL — ABNORMAL LOW (ref 100–199)
HDL: 25 mg/dL — ABNORMAL LOW (ref >39)
LDL Chol Calc (NIH): 21 mg/dL (ref 0–99)
Triglycerides: 222 mg/dL — ABNORMAL HIGH (ref 0–149)
VLDL Cholesterol Cal: 34 mg/dL (ref 5–40)

## 2019-07-17 LAB — SPECIMEN STATUS REPORT

## 2019-07-17 NOTE — Progress Notes (Signed)
Pharmacist Chemotherapy Monitoring - Follow Up Assessment    I verify that I have reviewed each item in the below checklist:  . Regimen for the patient is scheduled for the appropriate day and plan matches scheduled date. Marland Kitchen Appropriate non-routine labs are ordered dependent on drug ordered. . If applicable, additional medications reviewed and ordered per protocol based on lifetime cumulative doses and/or treatment regimen.   Plan for follow-up and/or issues identified: No . I-vent associated with next due treatment: No . MD and/or nursing notified: No  Markeesha Char K 07/17/2019 9:01 AM

## 2019-07-18 ENCOUNTER — Inpatient Hospital Stay: Payer: 59

## 2019-07-18 ENCOUNTER — Encounter: Payer: Self-pay | Admitting: Internal Medicine

## 2019-07-19 ENCOUNTER — Other Ambulatory Visit: Payer: Self-pay | Admitting: Internal Medicine

## 2019-07-19 MED ORDER — MAGNESIUM OXIDE 400 (241.3 MG) MG PO TABS: 400.0000 mg | ORAL_TABLET | Freq: Three times a day (TID) | ORAL | 0 refills | Status: DC

## 2019-07-21 ENCOUNTER — Inpatient Hospital Stay: Payer: 59

## 2019-07-21 NOTE — Progress Notes (Signed)
Pharmacist Chemotherapy Monitoring - Follow Up Assessment    I verify that I have reviewed each item in the below checklist:  . Regimen for the patient is scheduled for the appropriate day and plan matches scheduled date. Marland Kitchen Appropriate non-routine labs are ordered dependent on drug ordered. . If applicable, additional medications reviewed and ordered per protocol based on lifetime cumulative doses and/or treatment regimen.   Plan for follow-up and/or issues identified: No . I-vent associated with next due treatment: No . MD and/or nursing notified: No  Christy Abbott D 07/21/2019 4:34 PM

## 2019-07-22 ENCOUNTER — Other Ambulatory Visit: Payer: 59

## 2019-07-23 ENCOUNTER — Encounter: Payer: Self-pay | Admitting: Family

## 2019-07-23 ENCOUNTER — Inpatient Hospital Stay: Payer: 59

## 2019-07-23 ENCOUNTER — Other Ambulatory Visit: Payer: Self-pay

## 2019-07-23 VITALS — BP 146/78 | HR 83 | Temp 98.3°F | Resp 18

## 2019-07-23 DIAGNOSIS — R300 Dysuria: Secondary | ICD-10-CM

## 2019-07-23 DIAGNOSIS — Z5111 Encounter for antineoplastic chemotherapy: Secondary | ICD-10-CM | POA: Diagnosis not present

## 2019-07-23 DIAGNOSIS — C3492 Malignant neoplasm of unspecified part of left bronchus or lung: Secondary | ICD-10-CM

## 2019-07-23 LAB — CBC WITH DIFFERENTIAL (CANCER CENTER ONLY)
Abs Immature Granulocytes: 0.02 10*3/uL (ref 0.00–0.07)
Basophils Absolute: 0 10*3/uL (ref 0.0–0.1)
Basophils Relative: 1 %
Eosinophils Absolute: 0 10*3/uL (ref 0.0–0.5)
Eosinophils Relative: 0 %
HCT: 28.3 % — ABNORMAL LOW (ref 36.0–46.0)
Hemoglobin: 9.5 g/dL — ABNORMAL LOW (ref 12.0–15.0)
Immature Granulocytes: 0 %
Lymphocytes Relative: 19 %
Lymphs Abs: 1.2 10*3/uL (ref 0.7–4.0)
MCH: 31.3 pg (ref 26.0–34.0)
MCHC: 33.6 g/dL (ref 30.0–36.0)
MCV: 93.1 fL (ref 80.0–100.0)
Monocytes Absolute: 0.9 10*3/uL (ref 0.1–1.0)
Monocytes Relative: 13 %
Neutro Abs: 4.2 10*3/uL (ref 1.7–7.7)
Neutrophils Relative %: 67 %
Platelet Count: 124 10*3/uL — ABNORMAL LOW (ref 150–400)
RBC: 3.04 MIL/uL — ABNORMAL LOW (ref 3.87–5.11)
RDW: 17.6 % — ABNORMAL HIGH (ref 11.5–15.5)
WBC Count: 6.3 10*3/uL (ref 4.0–10.5)
nRBC: 0 % (ref 0.0–0.2)

## 2019-07-23 LAB — CMP (CANCER CENTER ONLY)
ALT: 7 U/L (ref 0–44)
AST: 16 U/L (ref 15–41)
Albumin: 3.4 g/dL — ABNORMAL LOW (ref 3.5–5.0)
Alkaline Phosphatase: 113 U/L (ref 38–126)
Anion gap: 9 (ref 5–15)
BUN: 17 mg/dL (ref 8–23)
CO2: 28 mmol/L (ref 22–32)
Calcium: 9 mg/dL (ref 8.9–10.3)
Chloride: 102 mmol/L (ref 98–111)
Creatinine: 0.87 mg/dL (ref 0.44–1.00)
GFR, Est AFR Am: >60 mL/min (ref >60)
GFR, Estimated: >60 mL/min (ref >60)
Glucose, Bld: 183 mg/dL — ABNORMAL HIGH (ref 70–99)
Potassium: 4.4 mmol/L (ref 3.5–5.1)
Sodium: 139 mmol/L (ref 135–145)
Total Bilirubin: 1 mg/dL (ref 0.3–1.2)
Total Protein: 6.5 g/dL (ref 6.5–8.1)

## 2019-07-23 LAB — MAGNESIUM: Magnesium: 1.9 mg/dL (ref 1.7–2.4)

## 2019-07-23 MED ORDER — SODIUM CHLORIDE 0.9 % IV SOLN
10.0000 mg | Freq: Once | INTRAVENOUS | Status: AC
  Administered 2019-07-23: 10 mg via INTRAVENOUS
  Filled 2019-07-23: qty 1
  Filled 2019-07-23: qty 10

## 2019-07-23 MED ORDER — HEPARIN SOD (PORK) LOCK FLUSH 100 UNIT/ML IV SOLN
500.0000 [IU] | Freq: Once | INTRAVENOUS | Status: AC | PRN
  Administered 2019-07-23: 500 [IU]
  Filled 2019-07-23: qty 5

## 2019-07-23 MED ORDER — SODIUM CHLORIDE 0.9 % IV SOLN
60.0000 mg/m2 | Freq: Once | INTRAVENOUS | Status: AC
  Administered 2019-07-23: 107 mg via INTRAVENOUS
  Filled 2019-07-23: qty 107

## 2019-07-23 MED ORDER — POTASSIUM CHLORIDE 2 MEQ/ML IV SOLN
Freq: Once | INTRAVENOUS | Status: AC
  Filled 2019-07-23: qty 10

## 2019-07-23 MED ORDER — PALONOSETRON HCL INJECTION 0.25 MG/5ML
INTRAVENOUS | Status: AC
  Filled 2019-07-23: qty 5

## 2019-07-23 MED ORDER — SODIUM CHLORIDE 0.9 % IV SOLN
150.0000 mg | Freq: Once | INTRAVENOUS | Status: AC
  Administered 2019-07-23: 150 mg via INTRAVENOUS
  Filled 2019-07-23: qty 5
  Filled 2019-07-23: qty 150

## 2019-07-23 MED ORDER — SODIUM CHLORIDE 0.9% FLUSH
10.0000 mL | INTRAVENOUS | Status: DC | PRN
  Administered 2019-07-23: 10 mL
  Filled 2019-07-23: qty 10

## 2019-07-23 MED ORDER — SODIUM CHLORIDE 0.9 % IV SOLN
Freq: Once | INTRAVENOUS | Status: AC
  Filled 2019-07-23: qty 250

## 2019-07-23 MED ORDER — CIPROFLOXACIN HCL 500 MG PO TABS
500.0000 mg | ORAL_TABLET | Freq: Two times a day (BID) | ORAL | 0 refills | Status: DC
Start: 2019-07-23 — End: 2019-08-13

## 2019-07-23 MED ORDER — SODIUM CHLORIDE 0.9 % IV SOLN
80.0000 mg/m2 | Freq: Once | INTRAVENOUS | Status: AC
  Administered 2019-07-23: 140 mg via INTRAVENOUS
  Filled 2019-07-23: qty 7

## 2019-07-23 MED ORDER — PALONOSETRON HCL INJECTION 0.25 MG/5ML
0.2500 mg | Freq: Once | INTRAVENOUS | Status: AC
  Administered 2019-07-23: 0.25 mg via INTRAVENOUS

## 2019-07-23 NOTE — Patient Instructions (Signed)
[facility] Discharge Instructions for Patients Receiving Chemotherapy  Today you received the following chemotherapy agents Cisplatin and Etoposide  To help prevent nausea and vomiting after your treatment, we encourage you to take your nausea medication as directed.    If you develop nausea and vomiting that is not controlled by your nausea medication, call the clinic.   BELOW ARE SYMPTOMS THAT SHOULD BE REPORTED IMMEDIATELY:  *FEVER GREATER THAN 100.5 F  *CHILLS WITH OR WITHOUT FEVER  NAUSEA AND VOMITING THAT IS NOT CONTROLLED WITH YOUR NAUSEA MEDICATION  *UNUSUAL SHORTNESS OF BREATH  *UNUSUAL BRUISING OR BLEEDING  TENDERNESS IN MOUTH AND THROAT WITH OR WITHOUT PRESENCE OF ULCERS  *URINARY PROBLEMS  *BOWEL PROBLEMS  UNUSUAL RASH Items with * indicate a potential emergency and should be followed up as soon as possible.  Feel free to call the clinic should you have any questions or concerns. The clinic phone number is [phone].  Please show the [place] at check-in to the Emergency Department and triage nurse.

## 2019-07-23 NOTE — Progress Notes (Signed)
Patient presents for a f/u visit with Dr. Sondra Come.  Pain: None  Fatigue: No  Cough: No  Shortness of breath: No  Problems swallowing: No  Skin irritation: No  Chemotherapy: last infusion? 5/14 is her last chemo tx Vitals:   07/24/19 1029  BP: (!) 150/94  Pulse: 92  Temp: 97.8 F (36.6 C)  Resp: 18  Height: 5\' 4"  (1.626 m)  Weight: 145 lb (65.8 kg)  SpO2: 99%  TempSrc: Temporal  BMI (Calculated): 24.88   Wt Readings from Last 3 Encounters:  07/24/19 145 lb (65.8 kg)  07/16/19 141 lb 4.8 oz (64.1 kg)  07/10/19 143 lb 9.6 oz (65.1 kg)   Weight

## 2019-07-23 NOTE — Progress Notes (Signed)
Radiation Oncology         [phone] ________________________________  Name: Christy Abbott MRN: [id number]  Date: 07/24/2019  DOB: 04-02-1955  Follow-Up Visit Note  CC: [name], NP  Tish Men, MD    ICD-10-CM   1. Small cell lung cancer (HCC)  C34.90     Diagnosis: Stage IIB (cT1cN1M0) limited stage small cell carcinoma of the left lung  Interval Since Last Radiation: One month.  Radiation Treatment Dates: 05/14/2019 through 06/24/2019 Site Technique Total Dose (Gy) Dose per Fx (Gy) Completed Fx Beam Energies  Lung, Left: Lung_Lt 3D 60/60 2 30/30 6X, 10X, 15X    Narrative:  The patient returns today for routine follow-up. She underwent a CT of chest on 06/24/2019 that showed an incidental acute left-sided pulmonary embolism involving lobar, segmental, and subsegmental left lower lobe branches. She was started on a three-week course of Xarelto for this. There was also noted to be an interval response to therapy, as the hypermetabolic central left upper lobe 2.2 cm solid pulmonary nodule had resolved. The additional smaller left upper lobe and left lower lobe pulmonary nodules had decreased and there was no new or progressive metastatic disease in the chest.   She was last seen by Dr. Julien Nordmann on 07/16/2019, during which time she was noted to be doing well with the exception of fatigue and a recently diagnosed UTI. She is status post four cycles of systemic chemotherapy with Cisplatin and Etoposide. She has experienced significant pancytopenia and fatigue requiring PRBCs transfusion as well as platelet transfusion.   On review of systems, she reports no complaints. She denies pain, fatigue, cough, shortness of breath, difficulty swallowing, and skin irritation.  ALLERGIES:  is allergic to jardiance [empagliflozin] and trazodone and nefazodone.  Meds: Current Outpatient Medications  Medication Sig Dispense Refill  . Bioflavonoid Products (ESTER C PO) Take 1 tablet by  mouth daily.    . Calcium Carb-Cholecalciferol (CALTRATE 600+D3 PO) Take 1 tablet by mouth daily.    . carvedilol (COREG) 12.5 MG tablet Take 1 tablet (12.5 mg total) by mouth in the morning and at bedtime. 180 tablet 3  . Continuous Blood Gluc Sensor (FREESTYLE LIBRE 2 SENSOR) MISC 2 Devices by Does not apply route every 14 (fourteen) days. 2 each 3  . diphenhydramine-acetaminophen (TYLENOL PM) 25-500 MG TABS tablet Take 2 tablets by mouth at bedtime.    . flecainide (TAMBOCOR) 50 MG tablet TAKE 1 TABLET BY MOUTH TWICE DAILY, MAY TAKE AN ADDITIONAL 1 TABLET AS NEEDED. Please keep upcoming appt in January. Thank you (Patient taking differently: Take 50 mg by mouth as directed. Take 50 mg by mouth twice daily, may take a third 50 mg dose as needed for palpitations) 270 tablet 0  . furosemide (LASIX) 20 MG tablet Take 1 tablet (20 mg total) by mouth daily as needed. (Patient taking differently: Take 20 mg by mouth daily as needed. fluid) 60 tablet 0  . insulin aspart (NOVOLOG) 100 UNIT/ML FlexPen Inject subcutaneously 3 times daily prior to meals per sliding scale 15 mL 1  . insulin glargine (LANTUS) 100 unit/mL SOPN Inject 0.1 mLs (10 Units total) into the skin daily. (Patient taking differently: Inject 10 Units into the skin at bedtime. ) 15 mL 2  . Insulin Pen Needle (B-D UF III MINI PEN NEEDLES) 31G X 5 MM MISC Use with insulin pen 100 each 3  . Lancets 30G MISC Check sugar twice daily. 100 each 3  . lisinopril-hydrochlorothiazide (PRINZIDE,ZESTORETIC) 20-25 MG tablet  Take 1 tablet by mouth daily. 90 tablet 1  . LORazepam (ATIVAN) 0.5 MG tablet Place 1 tablet (0.5 mg total) under the tongue every 8 (eight) hours as needed (nausea). 30 tablet 0  . magnesium oxide (MAG-OX) 400 (241.3 Mg) MG tablet Take 1 tablet (400 mg total) by mouth 3 (three) times daily. 90 tablet 0  . metFORMIN (GLUCOPHAGE) 500 MG tablet TAKE 2 TABLETS BY MOUTH TWICE DAILY . APPOINTMENT REQUIRED FOR FUTURE REFILLS 120 tablet 0  .  methimazole (TAPAZOLE) 10 MG tablet Take 1 tablet (10 mg total) by mouth daily. 90 tablet 0  . Omega-3 Fatty Acids (FISH OIL) 1200 MG CAPS Take 1,200 mg by mouth daily.     . potassium chloride SA (KLOR-CON) 20 MEQ tablet TAKE 1  BY MOUTH ONCE DAILY 90 tablet 0  . simvastatin (ZOCOR) 10 MG tablet TAKE 1 TABLET BY MOUTH AT BEDTIME 90 tablet 1  . Tetrahydrozoline HCl (VISINE OP) Place 1 drop into both eyes daily as needed (irritation).    Marland Kitchen albuterol (VENTOLIN HFA) 108 (90 Base) MCG/ACT inhaler Inhale 2 puffs into the lungs every 6 (six) hours as needed for wheezing or shortness of breath. (Patient not taking: Reported on 07/24/2019) 6.7 g 0  . cephALEXin (KEFLEX) 500 MG capsule Take 1 capsule (500 mg total) by mouth 3 (three) times daily. (Patient not taking: Reported on 07/24/2019) 15 capsule 0  . ciprofloxacin (CIPRO) 500 MG tablet Take 1 tablet (500 mg total) by mouth 2 (two) times daily. (Patient not taking: Reported on 07/24/2019) 6 tablet 0  . guaiFENesin-dextromethorphan (ROBITUSSIN DM) 100-10 MG/5ML syrup Take 5 mLs by mouth every 4 (four) hours as needed for cough. (Patient not taking: Reported on 07/24/2019) 118 mL 0  . lidocaine-prilocaine (EMLA) cream Apply 1 application topically as needed. Using a cotton ball , apply a small amount of cream to the skin over the porta cath site. Do not rub in the cream. Cover with Plastic wrap. 30 g 0  . ondansetron (ZOFRAN) 4 MG tablet Take 1 tablet (4 mg total) by mouth every 8 (eight) hours as needed for nausea or vomiting. (Patient not taking: Reported on 07/24/2019) 20 tablet 0  . prochlorperazine (COMPAZINE) 10 MG tablet Take 1 tablet (10 mg total) by mouth every 6 (six) hours as needed for nausea or vomiting. (Patient not taking: Reported on 07/24/2019) 30 tablet 0  . Rivaroxaban Stater Pack, 15 mg and 20 mg, (XARELTO STARTER PACK) Follow package directions: Take one 15mg  tablet by mouth twice a day. On day 22, switch to one 20mg  tablet once a day. Take with  food. (Patient not taking: Reported on 07/24/2019) 51 each 0   No current facility-administered medications for this encounter.    Physical Findings: The patient is in no acute distress. Patient is alert and oriented.  height is 5\' 4"  (1.626 m) and weight is 145 lb (65.8 kg). Her temporal temperature is 97.8 F (36.6 C). Her blood pressure is 150/94 (abnormal) and her pulse is 92. Her respiration is 18 and oxygen saturation is 99%.    Lungs are clear to auscultation bilaterally. Heart has regular rate and rhythm. No palpable cervical, supraclavicular, or axillary adenopathy. Abdomen soft, non-tender, normal bowel sounds.   Lab Findings: Lab Results  Component Value Date   WBC 6.3 07/23/2019   HGB 9.5 (L) 07/23/2019   HCT 28.3 (L) 07/23/2019   MCV 93.1 07/23/2019   PLT 124 (L) 07/23/2019    Radiographic Findings: No results  found.  Impression: Stage IIB (cT1cN1M0) limited stage small cell carcinoma of the left lung  The patient is recovering from the effects of radiation.  Reports no esophageal symptoms at this time.  Fatigue is her only issue.  Plan: Patient will continue with chemotherapy and complete this in June.  She will then proceed with CT scan. Idf she is in remission then she will be evaluated for prophylactic cranial irradiation.  Patient would like to undergo additional brain MRI prior to making a determination concerning PCI.  We will set this up and then see the patient soon afterward in June or early July  ____________________________________   Blair Promise, PhD, MD  This document serves as a record of services personally performed by Gery Pray, MD. It was created on his behalf by Clerance Lav, a trained medical scribe. The creation of this record is based on the scribe's personal observations and the provider's statements to them. This document has been checked and approved by the attending provider.

## 2019-07-23 NOTE — Telephone Encounter (Signed)
Please advise pt that I sent cipro instead in case the UTI is not sensitive to keflex.  Can she come by to leave a urine for culture or can her husband bring [place] a specimen?  I would really like to culture her urine. Dx UTI.

## 2019-07-23 NOTE — Telephone Encounter (Signed)
Patient advised new rx was sent to her pharmacy. She will not be able to come in to this location this week due to having chemo at [facility]. Patient advised to drop specimen at our [facility] lab tomorrow, she agrees and said she will be able to do that. Order placed for urine culture.

## 2019-07-24 ENCOUNTER — Encounter: Payer: Self-pay | Admitting: Radiation Oncology

## 2019-07-24 ENCOUNTER — Inpatient Hospital Stay: Payer: 59

## 2019-07-24 ENCOUNTER — Other Ambulatory Visit: Payer: Self-pay

## 2019-07-24 ENCOUNTER — Encounter: Payer: Self-pay | Admitting: Internal Medicine

## 2019-07-24 ENCOUNTER — Ambulatory Visit
Admission: RE | Admit: 2019-07-24 | Discharge: 2019-07-24 | Disposition: A | Discharge status: Home / Self Care | Payer: 59 | Source: Ambulatory Visit | Attending: Radiation Oncology | Admitting: Radiation Oncology

## 2019-07-24 VITALS — BP 147/83 | HR 92 | Temp 97.8°F | Resp 18

## 2019-07-24 VITALS — BP 150/94 | HR 92 | Temp 97.8°F | Resp 18 | Ht 64.0 in | Wt 145.0 lb

## 2019-07-24 DIAGNOSIS — C779 Secondary and unspecified malignant neoplasm of lymph node, unspecified: Secondary | ICD-10-CM | POA: Insufficient documentation

## 2019-07-24 DIAGNOSIS — Z923 Personal history of irradiation: Secondary | ICD-10-CM | POA: Insufficient documentation

## 2019-07-24 DIAGNOSIS — C3490 Malignant neoplasm of unspecified part of unspecified bronchus or lung: Secondary | ICD-10-CM

## 2019-07-24 DIAGNOSIS — Z7901 Long term (current) use of anticoagulants: Secondary | ICD-10-CM | POA: Insufficient documentation

## 2019-07-24 DIAGNOSIS — C3492 Malignant neoplasm of unspecified part of left bronchus or lung: Secondary | ICD-10-CM

## 2019-07-24 DIAGNOSIS — N390 Urinary tract infection, site not specified: Secondary | ICD-10-CM | POA: Insufficient documentation

## 2019-07-24 DIAGNOSIS — Z5111 Encounter for antineoplastic chemotherapy: Secondary | ICD-10-CM | POA: Diagnosis not present

## 2019-07-24 DIAGNOSIS — Z794 Long term (current) use of insulin: Secondary | ICD-10-CM | POA: Insufficient documentation

## 2019-07-24 DIAGNOSIS — C3412 Malignant neoplasm of upper lobe, left bronchus or lung: Secondary | ICD-10-CM | POA: Insufficient documentation

## 2019-07-24 DIAGNOSIS — Z79899 Other long term (current) drug therapy: Secondary | ICD-10-CM | POA: Insufficient documentation

## 2019-07-24 DIAGNOSIS — R5383 Other fatigue: Secondary | ICD-10-CM | POA: Insufficient documentation

## 2019-07-24 MED ORDER — HEPARIN SOD (PORK) LOCK FLUSH 100 UNIT/ML IV SOLN
500.0000 [IU] | Freq: Once | INTRAVENOUS | Status: AC | PRN
  Administered 2019-07-24: 500 [IU]
  Filled 2019-07-24: qty 5

## 2019-07-24 MED ORDER — SODIUM CHLORIDE 0.9 % IV SOLN
10.0000 mg | Freq: Once | INTRAVENOUS | Status: AC
  Administered 2019-07-24: 10 mg via INTRAVENOUS
  Filled 2019-07-24: qty 10

## 2019-07-24 MED ORDER — SODIUM CHLORIDE 0.9 % IV SOLN
Freq: Once | INTRAVENOUS | Status: AC
  Filled 2019-07-24: qty 250

## 2019-07-24 MED ORDER — SODIUM CHLORIDE 0.9% FLUSH
10.0000 mL | INTRAVENOUS | Status: DC | PRN
  Administered 2019-07-24: 10 mL
  Filled 2019-07-24: qty 10

## 2019-07-24 MED ORDER — SODIUM CHLORIDE 0.9 % IV SOLN
80.0000 mg/m2 | Freq: Once | INTRAVENOUS | Status: AC
  Administered 2019-07-24: 140 mg via INTRAVENOUS
  Filled 2019-07-24: qty 7

## 2019-07-24 NOTE — Patient Instructions (Signed)
[facility] Discharge Instructions for Patients Receiving Chemotherapy  Today you received the following chemotherapy agents Etoposide  To help prevent nausea and vomiting after your treatment, we encourage you to take your nausea medication as directed.    If you develop nausea and vomiting that is not controlled by your nausea medication, call the clinic.   BELOW ARE SYMPTOMS THAT SHOULD BE REPORTED IMMEDIATELY:  *FEVER GREATER THAN 100.5 F  *CHILLS WITH OR WITHOUT FEVER  NAUSEA AND VOMITING THAT IS NOT CONTROLLED WITH YOUR NAUSEA MEDICATION  *UNUSUAL SHORTNESS OF BREATH  *UNUSUAL BRUISING OR BLEEDING  TENDERNESS IN MOUTH AND THROAT WITH OR WITHOUT PRESENCE OF ULCERS  *URINARY PROBLEMS  *BOWEL PROBLEMS  UNUSUAL RASH Items with * indicate a potential emergency and should be followed up as soon as possible.  Feel free to call the clinic should you have any questions or concerns. The clinic phone number is [phone].  Please show the [place] at check-in to the Emergency Department and triage nurse.

## 2019-07-24 NOTE — Progress Notes (Incomplete)
  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 She did report nausea in the beginning of treatment that was attributed to chemotherapy and was relieved with antiemetics. She also reported some mild fatigue, a cramping pain in her chest during the late afternoon and evening hours that spontaneously resolved, heartburn, and difficulty swallowing pills that began midway through treatment. Carafate was prescribed and symptoms resolved. Towards the end of treatment, she began to report a rare but non-productive cough and shortness of breath with exertion. She denied hemoptysis.  Plan: The patient will follow-up with radiation oncology in one month.  ________________________________________________  -----------------------------------  Blair Promise, PhD, MD  This document serves as a record of services personally performed by Gery Pray, MD. It was created on his behalf by Clerance Lav, a trained medical scribe. The creation of this record is based on the scribe's personal observations and the provider's statements to them. This document has been checked and approved by the attending provider.

## 2019-07-25 ENCOUNTER — Other Ambulatory Visit: Payer: Self-pay

## 2019-07-25 ENCOUNTER — Ambulatory Visit: Payer: 59 | Admitting: Endocrinology

## 2019-07-25 ENCOUNTER — Inpatient Hospital Stay: Payer: 59

## 2019-07-25 VITALS — BP 155/91 | HR 67 | Temp 98.0°F | Resp 20

## 2019-07-25 DIAGNOSIS — C3492 Malignant neoplasm of unspecified part of left bronchus or lung: Secondary | ICD-10-CM

## 2019-07-25 DIAGNOSIS — Z5111 Encounter for antineoplastic chemotherapy: Secondary | ICD-10-CM | POA: Diagnosis not present

## 2019-07-25 MED ORDER — HEPARIN SOD (PORK) LOCK FLUSH 100 UNIT/ML IV SOLN
500.0000 [IU] | Freq: Once | INTRAVENOUS | Status: AC | PRN
  Administered 2019-07-25: 500 [IU]
  Filled 2019-07-25: qty 5

## 2019-07-25 MED ORDER — SODIUM CHLORIDE 0.9 % IV SOLN
80.0000 mg/m2 | Freq: Once | INTRAVENOUS | Status: AC
  Administered 2019-07-25: 140 mg via INTRAVENOUS
  Filled 2019-07-25: qty 7

## 2019-07-25 MED ORDER — SODIUM CHLORIDE 0.9 % IV SOLN
10.0000 mg | Freq: Once | INTRAVENOUS | Status: AC
  Administered 2019-07-25: 10 mg via INTRAVENOUS
  Filled 2019-07-25: qty 10

## 2019-07-25 MED ORDER — SODIUM CHLORIDE 0.9 % IV SOLN
Freq: Once | INTRAVENOUS | Status: AC
  Filled 2019-07-25: qty 250

## 2019-07-25 MED ORDER — SODIUM CHLORIDE 0.9% FLUSH
10.0000 mL | INTRAVENOUS | Status: DC | PRN
  Administered 2019-07-25: 10 mL
  Filled 2019-07-25: qty 10

## 2019-07-25 NOTE — Patient Instructions (Signed)
Cancer Center Discharge Instructions for Patients Receiving Chemotherapy  Today you received the following chemotherapy agents: Etoposide (VEPESID).  To help prevent nausea and vomiting after your treatment, we encourage you to take your nausea medication as prescribed.   If you develop nausea and vomiting that is not controlled by your nausea medication, call the clinic.   BELOW ARE SYMPTOMS THAT SHOULD BE REPORTED IMMEDIATELY:  *FEVER GREATER THAN 100.5 F  *CHILLS WITH OR WITHOUT FEVER  NAUSEA AND VOMITING THAT IS NOT CONTROLLED WITH YOUR NAUSEA MEDICATION  *UNUSUAL SHORTNESS OF BREATH  *UNUSUAL BRUISING OR BLEEDING  TENDERNESS IN MOUTH AND THROAT WITH OR WITHOUT PRESENCE OF ULCERS  *URINARY PROBLEMS  *BOWEL PROBLEMS  UNUSUAL RASH Items with * indicate a potential emergency and should be followed up as soon as possible.  Feel free to call the clinic should you have any questions or concerns. The clinic phone number is [phone].  Please show the CHEMO ALERT CARD at check-in to the Emergency Department and triage nurse.   

## 2019-07-26 ENCOUNTER — Inpatient Hospital Stay: Payer: 59

## 2019-07-26 VITALS — BP 106/76 | Temp 98.6°F | Resp 18

## 2019-07-26 DIAGNOSIS — C3492 Malignant neoplasm of unspecified part of left bronchus or lung: Secondary | ICD-10-CM

## 2019-07-26 DIAGNOSIS — Z5111 Encounter for antineoplastic chemotherapy: Secondary | ICD-10-CM | POA: Diagnosis not present

## 2019-07-26 MED ORDER — PEGFILGRASTIM-JMDB 6 MG/0.6ML ~~LOC~~ SOSY
6.0000 mg | PREFILLED_SYRINGE | Freq: Once | SUBCUTANEOUS | Status: AC
  Administered 2019-07-26: 6 mg via SUBCUTANEOUS

## 2019-07-26 MED ORDER — PEGFILGRASTIM-JMDB 6 MG/0.6ML ~~LOC~~ SOSY
PREFILLED_SYRINGE | SUBCUTANEOUS | Status: AC
  Filled 2019-07-26: qty 0.6

## 2019-07-26 NOTE — Progress Notes (Signed)
Called to lobby by Bennett Scrape, NT stating pt had fallen.   Arrived to lobby to find pt lying flat - assisted by staff to lying position.  Pt states "my legs just went out".   VSS Blood glucose 205.   Pt states blood sugar 235 at home before taking her insulin.   Pt A/O, No c/o pain. No visible injuries.   Pt assisted to standing position and taken by wheelchair to infusion room for injection appt. Dr Maylon Peppers, on call physician, made aware.   Order received to give 1L of IVFs if pt feels she needs. Pt declined to stay stating "I feel much better now".  Pt also states she has not eaten today and that she will be eating as soon as she leaves. Pt was ambulatory to the lobby with spouse accompanying her.  Safety zone entered.

## 2019-07-28 ENCOUNTER — Encounter: Payer: Self-pay | Admitting: Medical Oncology

## 2019-07-28 LAB — GLUCOSE, CAPILLARY: Glucose-Capillary: 235 mg/dL — ABNORMAL HIGH (ref 70–99)

## 2019-07-29 ENCOUNTER — Other Ambulatory Visit: Payer: 59

## 2019-07-29 ENCOUNTER — Other Ambulatory Visit: Payer: Self-pay

## 2019-07-29 ENCOUNTER — Inpatient Hospital Stay: Payer: 59

## 2019-07-29 ENCOUNTER — Encounter: Payer: Self-pay | Admitting: Medical

## 2019-07-29 DIAGNOSIS — C3492 Malignant neoplasm of unspecified part of left bronchus or lung: Secondary | ICD-10-CM

## 2019-07-29 DIAGNOSIS — E781 Pure hyperglyceridemia: Secondary | ICD-10-CM

## 2019-07-29 DIAGNOSIS — Z5111 Encounter for antineoplastic chemotherapy: Secondary | ICD-10-CM | POA: Diagnosis not present

## 2019-07-29 LAB — CBC WITH DIFFERENTIAL (CANCER CENTER ONLY)
Abs Immature Granulocytes: 0 10*3/uL (ref 0.00–0.07)
Basophils Absolute: 0 10*3/uL (ref 0.0–0.1)
Basophils Relative: 0 %
Eosinophils Absolute: 0 10*3/uL (ref 0.0–0.5)
Eosinophils Relative: 0 %
HCT: 26.1 % — ABNORMAL LOW (ref 36.0–46.0)
Hemoglobin: 8.8 g/dL — ABNORMAL LOW (ref 12.0–15.0)
Lymphocytes Relative: 12 %
Lymphs Abs: 1.3 10*3/uL (ref 0.7–4.0)
MCH: 31.7 pg (ref 26.0–34.0)
MCHC: 33.7 g/dL (ref 30.0–36.0)
MCV: 93.9 fL (ref 80.0–100.0)
Monocytes Absolute: 0 10*3/uL — ABNORMAL LOW (ref 0.1–1.0)
Monocytes Relative: 0 %
Neutro Abs: 9.7 10*3/uL — ABNORMAL HIGH (ref 1.7–7.7)
Neutrophils Relative %: 88 %
Platelet Count: 115 10*3/uL — ABNORMAL LOW (ref 150–400)
RBC: 2.78 MIL/uL — ABNORMAL LOW (ref 3.87–5.11)
RDW: 17.1 % — ABNORMAL HIGH (ref 11.5–15.5)
WBC Count: 11 10*3/uL — ABNORMAL HIGH (ref 4.0–10.5)
nRBC: 0 % (ref 0.0–0.2)

## 2019-07-29 LAB — CMP (CANCER CENTER ONLY)
ALT: 8 U/L (ref 0–44)
AST: 11 U/L — ABNORMAL LOW (ref 15–41)
Albumin: 3.5 g/dL (ref 3.5–5.0)
Alkaline Phosphatase: 135 U/L — ABNORMAL HIGH (ref 38–126)
Anion gap: 12 (ref 5–15)
BUN: 29 mg/dL — ABNORMAL HIGH (ref 8–23)
CO2: 31 mmol/L (ref 22–32)
Calcium: 8.9 mg/dL (ref 8.9–10.3)
Chloride: 94 mmol/L — ABNORMAL LOW (ref 98–111)
Creatinine: 0.92 mg/dL (ref 0.44–1.00)
GFR, Est AFR Am: >60 mL/min (ref >60)
GFR, Estimated: >60 mL/min (ref >60)
Glucose, Bld: 243 mg/dL — ABNORMAL HIGH (ref 70–99)
Potassium: 3.5 mmol/L (ref 3.5–5.1)
Sodium: 137 mmol/L (ref 135–145)
Total Bilirubin: 1.2 mg/dL (ref 0.3–1.2)
Total Protein: 6.3 g/dL — ABNORMAL LOW (ref 6.5–8.1)

## 2019-07-29 LAB — MAGNESIUM: Magnesium: 1.7 mg/dL (ref 1.7–2.4)

## 2019-07-30 ENCOUNTER — Telehealth: Payer: Self-pay | Admitting: Medical Oncology

## 2019-07-30 ENCOUNTER — Inpatient Hospital Stay: Payer: 59

## 2019-07-30 ENCOUNTER — Other Ambulatory Visit: Payer: Self-pay | Admitting: Medical Oncology

## 2019-07-30 ENCOUNTER — Other Ambulatory Visit: Payer: Self-pay

## 2019-07-30 VITALS — BP 118/78 | HR 90 | Temp 98.4°F | Resp 18

## 2019-07-30 DIAGNOSIS — I959 Hypotension, unspecified: Secondary | ICD-10-CM

## 2019-07-30 DIAGNOSIS — Z5111 Encounter for antineoplastic chemotherapy: Secondary | ICD-10-CM | POA: Diagnosis not present

## 2019-07-30 DIAGNOSIS — R112 Nausea with vomiting, unspecified: Secondary | ICD-10-CM

## 2019-07-30 DIAGNOSIS — Z95828 Presence of other vascular implants and grafts: Secondary | ICD-10-CM

## 2019-07-30 MED ORDER — HEPARIN SOD (PORK) LOCK FLUSH 100 UNIT/ML IV SOLN
500.0000 [IU] | Freq: Once | INTRAVENOUS | Status: AC
  Administered 2019-07-30: 500 [IU] via INTRAVENOUS
  Filled 2019-07-30: qty 5

## 2019-07-30 MED ORDER — SODIUM CHLORIDE 0.9 % IV SOLN
INTRAVENOUS | Status: DC
  Filled 2019-07-30 (×2): qty 250

## 2019-07-30 MED ORDER — SODIUM CHLORIDE 0.9% FLUSH
10.0000 mL | Freq: Once | INTRAVENOUS | Status: AC
  Administered 2019-07-30: 10 mL via INTRAVENOUS
  Filled 2019-07-30: qty 10

## 2019-07-30 NOTE — Patient Instructions (Signed)

## 2019-07-30 NOTE — Telephone Encounter (Addendum)
F/U from pt email  from last night. BP 74/54-pulse 120  BP today is  127/68 and pulse is 92.  Vomited bile 3-4 times yesterday =8oz. Denies diarrhea. Per Dr Julien Nordmann bring pt in for IVF and antiemetic prn.

## 2019-08-04 ENCOUNTER — Other Ambulatory Visit (INDEPENDENT_AMBULATORY_CARE_PROVIDER_SITE_OTHER): Payer: 59

## 2019-08-04 ENCOUNTER — Other Ambulatory Visit: Payer: Self-pay

## 2019-08-04 DIAGNOSIS — E1165 Type 2 diabetes mellitus with hyperglycemia: Secondary | ICD-10-CM | POA: Diagnosis not present

## 2019-08-04 DIAGNOSIS — Z794 Long term (current) use of insulin: Secondary | ICD-10-CM | POA: Diagnosis not present

## 2019-08-04 DIAGNOSIS — E0590 Thyrotoxicosis, unspecified without thyrotoxic crisis or storm: Secondary | ICD-10-CM | POA: Diagnosis not present

## 2019-08-04 LAB — GLUCOSE, RANDOM: Glucose, Bld: 209 mg/dL — ABNORMAL HIGH (ref 70–99)

## 2019-08-04 LAB — T4, FREE: Free T4: 0.9 ng/dL (ref 0.60–1.60)

## 2019-08-04 LAB — TSH: TSH: 1.51 u[IU]/mL (ref 0.35–4.50)

## 2019-08-05 ENCOUNTER — Telehealth: Payer: Self-pay

## 2019-08-05 ENCOUNTER — Other Ambulatory Visit: Payer: Self-pay

## 2019-08-05 ENCOUNTER — Ambulatory Visit: Payer: 59

## 2019-08-05 ENCOUNTER — Ambulatory Visit: Payer: 59 | Admitting: Physician Assistant

## 2019-08-05 ENCOUNTER — Other Ambulatory Visit: Payer: 59

## 2019-08-05 ENCOUNTER — Inpatient Hospital Stay: Payer: 59

## 2019-08-05 DIAGNOSIS — C3492 Malignant neoplasm of unspecified part of left bronchus or lung: Secondary | ICD-10-CM

## 2019-08-05 DIAGNOSIS — D649 Anemia, unspecified: Secondary | ICD-10-CM

## 2019-08-05 DIAGNOSIS — Z5111 Encounter for antineoplastic chemotherapy: Secondary | ICD-10-CM | POA: Diagnosis not present

## 2019-08-05 LAB — CBC WITH DIFFERENTIAL (CANCER CENTER ONLY)
Abs Immature Granulocytes: 1.14 10*3/uL — ABNORMAL HIGH (ref 0.00–0.07)
Basophils Absolute: 0 10*3/uL (ref 0.0–0.1)
Basophils Relative: 0 %
Eosinophils Absolute: 0 10*3/uL (ref 0.0–0.5)
Eosinophils Relative: 0 %
HCT: 19.2 % — ABNORMAL LOW (ref 36.0–46.0)
Hemoglobin: 6.7 g/dL — CL (ref 12.0–15.0)
Immature Granulocytes: 12 %
Lymphocytes Relative: 18 %
Lymphs Abs: 1.6 10*3/uL (ref 0.7–4.0)
MCH: 33.3 pg (ref 26.0–34.0)
MCHC: 34.9 g/dL (ref 30.0–36.0)
MCV: 95.5 fL (ref 80.0–100.0)
Monocytes Absolute: 1.2 10*3/uL — ABNORMAL HIGH (ref 0.1–1.0)
Monocytes Relative: 13 %
Neutro Abs: 5.3 10*3/uL (ref 1.7–7.7)
Neutrophils Relative %: 57 %
Platelet Count: 18 10*3/uL — ABNORMAL LOW (ref 150–400)
RBC: 2.01 MIL/uL — ABNORMAL LOW (ref 3.87–5.11)
RDW: 17.2 % — ABNORMAL HIGH (ref 11.5–15.5)
WBC Count: 9.4 10*3/uL (ref 4.0–10.5)
nRBC: 0.3 % — ABNORMAL HIGH (ref 0.0–0.2)

## 2019-08-05 LAB — CMP (CANCER CENTER ONLY)
ALT: <6 U/L (ref 0–44)
AST: 10 U/L — ABNORMAL LOW (ref 15–41)
Albumin: 3.3 g/dL — ABNORMAL LOW (ref 3.5–5.0)
Alkaline Phosphatase: 141 U/L — ABNORMAL HIGH (ref 38–126)
Anion gap: 9 (ref 5–15)
BUN: 13 mg/dL (ref 8–23)
CO2: 27 mmol/L (ref 22–32)
Calcium: 9.2 mg/dL (ref 8.9–10.3)
Chloride: 102 mmol/L (ref 98–111)
Creatinine: 0.92 mg/dL (ref 0.44–1.00)
GFR, Est AFR Am: >60 mL/min (ref >60)
GFR, Estimated: >60 mL/min (ref >60)
Glucose, Bld: 227 mg/dL — ABNORMAL HIGH (ref 70–99)
Potassium: 3.8 mmol/L (ref 3.5–5.1)
Sodium: 138 mmol/L (ref 135–145)
Total Bilirubin: 0.6 mg/dL (ref 0.3–1.2)
Total Protein: 6 g/dL — ABNORMAL LOW (ref 6.5–8.1)

## 2019-08-05 LAB — FRUCTOSAMINE: Fructosamine: 305 umol/L — ABNORMAL HIGH (ref 0–285)

## 2019-08-05 LAB — MAGNESIUM: Magnesium: 1.6 mg/dL — ABNORMAL LOW (ref 1.7–2.4)

## 2019-08-05 NOTE — Telephone Encounter (Signed)
Received a call from Modena Slater in the lab at 1013 to report patient's hemoglobin is 6.7. Dr. Julien Nordmann made aware at 1017. Per Dr. Julien Nordmann patient needs to receive 2 units of blood. Called patient and made her aware and patient scheduled for a lab appointment for a type and screen at 10:00 tomorrow 08/06/19 followed by an infusion appointment for 2 units of blood. Patient verbalized understanding of appointment times. Scheduling message sent.

## 2019-08-06 ENCOUNTER — Inpatient Hospital Stay: Payer: 59

## 2019-08-06 ENCOUNTER — Ambulatory Visit: Payer: 59

## 2019-08-06 ENCOUNTER — Other Ambulatory Visit: Payer: Self-pay

## 2019-08-06 DIAGNOSIS — D649 Anemia, unspecified: Secondary | ICD-10-CM

## 2019-08-06 DIAGNOSIS — Z5111 Encounter for antineoplastic chemotherapy: Secondary | ICD-10-CM | POA: Diagnosis not present

## 2019-08-06 LAB — PREPARE RBC (CROSSMATCH)

## 2019-08-06 MED ORDER — DIPHENHYDRAMINE HCL 25 MG PO CAPS
ORAL_CAPSULE | ORAL | Status: AC
  Filled 2019-08-06: qty 1

## 2019-08-06 MED ORDER — ACETAMINOPHEN 325 MG PO TABS
650.0000 mg | ORAL_TABLET | Freq: Once | ORAL | Status: AC
  Administered 2019-08-06: 650 mg via ORAL

## 2019-08-06 MED ORDER — SODIUM CHLORIDE 0.9% IV SOLUTION
250.0000 mL | Freq: Once | INTRAVENOUS | Status: AC
  Administered 2019-08-06: 250 mL via INTRAVENOUS
  Filled 2019-08-06: qty 250

## 2019-08-06 MED ORDER — SODIUM CHLORIDE 0.9% FLUSH
10.0000 mL | INTRAVENOUS | Status: AC | PRN
  Administered 2019-08-06: 10 mL
  Filled 2019-08-06: qty 10

## 2019-08-06 MED ORDER — DIPHENHYDRAMINE HCL 25 MG PO CAPS
25.0000 mg | ORAL_CAPSULE | Freq: Once | ORAL | Status: AC
  Administered 2019-08-06: 25 mg via ORAL

## 2019-08-06 MED ORDER — ACETAMINOPHEN 325 MG PO TABS
ORAL_TABLET | ORAL | Status: AC
  Filled 2019-08-06: qty 2

## 2019-08-06 MED ORDER — HEPARIN SOD (PORK) LOCK FLUSH 100 UNIT/ML IV SOLN
500.0000 [IU] | Freq: Every day | INTRAVENOUS | Status: AC | PRN
  Administered 2019-08-06: 500 [IU]
  Filled 2019-08-06: qty 5

## 2019-08-06 NOTE — Progress Notes (Signed)
Pharmacist Chemotherapy Monitoring - Follow Up Assessment    I verify that I have reviewed each item in the below checklist:  . Regimen for the patient is scheduled for the appropriate day and plan matches scheduled date. Marland Kitchen Appropriate non-routine labs are ordered dependent on drug ordered. . If applicable, additional medications reviewed and ordered per protocol based on lifetime cumulative doses and/or treatment regimen.   Plan for follow-up and/or issues identified: No . I-vent associated with next due treatment: No . MD and/or nursing notified: No  Christy Abbott 08/06/2019 4:20 PM

## 2019-08-06 NOTE — Patient Instructions (Signed)

## 2019-08-07 ENCOUNTER — Ambulatory Visit: Payer: 59

## 2019-08-07 ENCOUNTER — Ambulatory Visit (INDEPENDENT_AMBULATORY_CARE_PROVIDER_SITE_OTHER): Payer: 59 | Admitting: Endocrinology

## 2019-08-07 ENCOUNTER — Encounter: Payer: Self-pay | Admitting: Endocrinology

## 2019-08-07 VITALS — BP 146/78 | HR 83 | Ht 64.0 in | Wt 143.8 lb

## 2019-08-07 DIAGNOSIS — E0590 Thyrotoxicosis, unspecified without thyrotoxic crisis or storm: Secondary | ICD-10-CM

## 2019-08-07 DIAGNOSIS — Z794 Long term (current) use of insulin: Secondary | ICD-10-CM | POA: Diagnosis not present

## 2019-08-07 DIAGNOSIS — E1165 Type 2 diabetes mellitus with hyperglycemia: Secondary | ICD-10-CM | POA: Diagnosis not present

## 2019-08-07 LAB — TYPE AND SCREEN
ABO/RH(D): A POS
Antibody Screen: NEGATIVE
Unit division: 0
Unit division: 0

## 2019-08-07 LAB — BPAM RBC
Blood Product Expiration Date: 202106062359
Blood Product Expiration Date: 202106062359
ISSUE DATE / TIME: 202105261120
ISSUE DATE / TIME: 202105261120
Unit Type and Rh: 6200
Unit Type and Rh: 6200

## 2019-08-07 NOTE — Progress Notes (Signed)
[name], female   DOB: 1955-05-01, 64 y.o.   MRN: [id number]                                                                                                               Reason for Appointment:  Hyperthyroidism and diabetes, follow-up  Referring healthcare provider: [name], NP   Chief complaint: Follow-up   History of Present Illness:   HYPOTHYROIDISM: It is unclear what symptoms the patient had at initial diagnosis but she had a low TSH in 2014 She was referred for endocrinology evaluation but she did not do so because of the cost  Subsequently in 02/2018 she was started on methimazole, at that time she was apparently still not having any symptoms  including palpitations, shakiness, feeling excessively warm and sweaty, weight loss, and fatigue.  Baseline free T4 and T3 levels were normal but her thyrotropin receptor antibody was high She does not think she felt any different with starting methimazole  She was finally referred for endocrinology consultation and seen in 4/21 She has been prescribed methimazole 15 mg daily since 1/21 when her free T4 was mildly increased She did not feel any different with starting the treatment again  However she has had difficulties with fatigue and weight loss as well as some cold intolerance with going through chemotherapy and radiation  She takes her methimazole 15 mg irregularly apparently and she is taking it only 3 to 4 days a week Thyroid levels are in the normal range However thyrotropin receptor antibody was high in April  Wt Readings from Last 3 Encounters:  08/07/19 143 lb 12.8 oz (65.2 kg)  07/24/19 145 lb (65.8 kg)  07/16/19 141 lb 4.8 oz (64.1 kg)    Thyroid function tests as follows:     Lab Results  Component Value Date   FREET4 0.90 08/04/2019   FREET4 0.85 06/23/2019   FREET4 1.13 (H) 03/22/2019   T3FREE 2.9 06/23/2019   T3FREE 1.6 (L) 03/22/2019   T3FREE 3.5 02/25/2018   TSH 1.51  08/04/2019   TSH 1.19 06/23/2019   TSH 0.132 (L) 03/22/2019     Lab Results  Component Value Date   THYROTRECAB 2.72 (H) 06/23/2019   THYROTRECAB 41.3 (H) 03/08/2018   DIABETES management:  Date of diagnosis of type 2 diabetes mellitus:   2015?      Background history:  She has been on Metformin since diagnosis when her diabetes was relatively mild Subsequently Amaryl added After 2018 her blood sugars appear to be progressively worsening with increasing A1c She thinks that sometimes she would forget to take her medications in the morning and blood sugars would be higher for this reason She was admitted for severe hypoglycemia with her sepsis and recommended to be on insulin in 03/2019  Recent history:   Most recent A1c is 10.4 done on 06/23/2019, previously 13.9  INSULIN regimen is: Lantus 10 units at night, NovoLog 2-4 units only if blood sugar is over 250   Non-insulin hypoglycemic drugs the  patient is taking are: Metformin 2 g twice daily  Current management, blood sugar patterns and problems identified:  She was told to increase her Lantus progressively and also take NovoLog 4 to 6 units before meals  However she did not look at her instructions on her last visit and is still following the previous insulin regimen  Also did not bring her blood sugar monitor or any logbook today  She is now not getting any chemotherapy and was getting Decadron previously  She thinks her blood sugars are about 180 fasting  Postprandial readings are reportedly about 220 and her lab glucose was 227 fasting this week  She was told to look into the freestyle libre, Prescription was sent to the pharmacy but apparently she did not pick this up  She continues to take Metformin, Amaryl was stopped        Side effects from medications have been: None  Typical meal intake: Breakfast is mostly eggs and toast               Exercise:  None  Glucose monitoring:  done about 1 times a day          Glucometer:  [employer] brand    Blood Glucose readings as above    Dietician visit, most recent: Never  Weight history: Wt Readings from Last 3 Encounters:  08/07/19 143 lb 12.8 oz (65.2 kg)  07/24/19 145 lb (65.8 kg)  07/16/19 141 lb 4.8 oz (64.1 kg)   Lab Results  Component Value Date   HGBA1C 10.4 (A) 06/23/2019   HGBA1C 13.9 (H) 03/23/2019   HGBA1C 10.9 (H) 05/28/2018   Lab Results  Component Value Date   MICROALBUR <0.7 05/10/2015   Eads 21 07/10/2019   CREATININE 0.92 08/05/2019     Allergies as of 08/07/2019      Reactions   Jardiance [empagliflozin] Palpitations   Causes heart palpitations   Trazodone And Nefazodone Other (See Comments)   "hyped me up, could not sleep"      Medication List       Accurate as of Aug 07, 2019  8:33 PM. If you have any questions, ask your nurse or doctor.        albuterol 108 (90 Base) MCG/ACT inhaler Commonly known as: VENTOLIN HFA Inhale 2 puffs into the lungs every 6 (six) hours as needed for wheezing or shortness of breath.   B-D UF III MINI PEN NEEDLES 31G X 5 MM Misc Generic drug: Insulin Pen Needle Use with insulin pen   CALTRATE 600+D3 PO Take 1 tablet by mouth daily.   carvedilol 12.5 MG tablet Commonly known as: COREG Take 1 tablet (12.5 mg total) by mouth in the morning and at bedtime.   cephALEXin 500 MG capsule Commonly known as: KEFLEX Take 1 capsule (500 mg total) by mouth 3 (three) times daily.   ciprofloxacin 500 MG tablet Commonly known as: Cipro Take 1 tablet (500 mg total) by mouth 2 (two) times daily.   diphenhydramine-acetaminophen 25-500 MG Tabs tablet Commonly known as: TYLENOL PM Take 2 tablets by mouth at bedtime.   ESTER C PO Take 1 tablet by mouth daily.   Fish Oil 1200 MG Caps Take 1,200 mg by mouth daily.   flecainide 50 MG tablet Commonly known as: TAMBOCOR TAKE 1 TABLET BY MOUTH TWICE DAILY, MAY TAKE AN ADDITIONAL 1 TABLET AS NEEDED. Please keep upcoming appt in  January. Thank you What changed:   how much to take  how to  take this  when to take this  additional instructions   FreeStyle Libre 2 Sensor Misc 2 Devices by Does not apply route every 14 (fourteen) days.   furosemide 20 MG tablet Commonly known as: LASIX Take 1 tablet (20 mg total) by mouth daily as needed. What changed: additional instructions   guaiFENesin-dextromethorphan 100-10 MG/5ML syrup Commonly known as: ROBITUSSIN DM Take 5 mLs by mouth every 4 (four) hours as needed for cough.   insulin aspart 100 UNIT/ML FlexPen Commonly known as: NOVOLOG Inject subcutaneously 3 times daily prior to meals per sliding scale   insulin glargine 100 unit/mL Sopn Commonly known as: LANTUS Inject 0.1 mLs (10 Units total) into the skin daily. What changed: when to take this   Lancets 30G Misc Check sugar twice daily.   lidocaine-prilocaine cream Commonly known as: EMLA Apply 1 application topically as needed. Using a cotton ball , apply a small amount of cream to the skin over the porta cath site. Do not rub in the cream. Cover with Plastic wrap.   lisinopril-hydrochlorothiazide 20-25 MG tablet Commonly known as: ZESTORETIC Take 1 tablet by mouth daily.   LORazepam 0.5 MG tablet Commonly known as: Ativan Place 1 tablet (0.5 mg total) under the tongue every 8 (eight) hours as needed (nausea).   magnesium oxide 400 (241.3 Mg) MG tablet Commonly known as: MAG-OX Take 1 tablet (400 mg total) by mouth 3 (three) times daily.   metFORMIN 500 MG tablet Commonly known as: GLUCOPHAGE TAKE 2 TABLETS BY MOUTH TWICE DAILY . APPOINTMENT REQUIRED FOR FUTURE REFILLS   methimazole 10 MG tablet Commonly known as: TAPAZOLE Take 1 tablet (10 mg total) by mouth daily.   ondansetron 4 MG tablet Commonly known as: Zofran Take 1 tablet (4 mg total) by mouth every 8 (eight) hours as needed for nausea or vomiting.   potassium chloride SA 20 MEQ tablet Commonly known as: KLOR-CON TAKE 1   BY MOUTH ONCE DAILY   prochlorperazine 10 MG tablet Commonly known as: COMPAZINE Take 1 tablet (10 mg total) by mouth every 6 (six) hours as needed for nausea or vomiting.   simvastatin 10 MG tablet Commonly known as: ZOCOR TAKE 1 TABLET BY MOUTH AT BEDTIME   VISINE OP Place 1 drop into both eyes daily as needed (irritation).   Xarelto Starter Pack Generic drug: Rivaroxaban Stater Pack (15 mg and 20 mg) Follow package directions: Take one 15mg  tablet by mouth twice a day. On day 22, switch to one 20mg  tablet once a day. Take with food.           Past Medical History:  Diagnosis Date  . Arthritis   . Atrial flutter [facility])    s/p CTI by Dr Lovena Le 02/2013  . Chest pain   . Diabetes mellitus without complication ([place])   . Dysrhythmia    hx of Aflutter  . 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 Hemochromatosis associated with compound heterozygous mutation in HFE gene ([place]) 12/10/2017  . Hyperlipidemia   . Hypertension   . Hyperthyroidism 10/31/2014    Past Surgical History:  Procedure Laterality Date  . ABLATION  03-04-2013   CTI by Dr Lovena Le for atrial flutter  . ATRIAL FLUTTER ABLATION N/A 03/04/2013   Procedure: ATRIAL FLUTTER ABLATION;  Surgeon: Evans Lance, MD;  Location: [facility] CATH LAB;  Service: Cardiovascular;  Laterality: N/A;  . CHOLECYSTECTOMY  1982  . COLONOSCOPY W/ POLYPECTOMY     x 2  . ESOPHAGOGASTRODUODENOSCOPY  N/A 05/20/2014   Procedure: ESOPHAGOGASTRODUODENOSCOPY (EGD);  Surgeon: Teena Irani, MD;  Location: [facility];  Service: Endoscopy;  Laterality: N/A;  . IR IMAGING GUIDED PORT INSERTION  05/09/2019  . TONSILLECTOMY  1972 ?  Marland Kitchen VIDEO BRONCHOSCOPY WITH ENDOBRONCHIAL ULTRASOUND Left 04/30/2019   Procedure: VIDEO BRONCHOSCOPY WITH ENDOBRONCHIAL ULTRASOUND;  Surgeon: Garner Nash, DO;  Location: [facility];  Service: Pulmonary;  Laterality: Left;    Family History  Problem Relation Age of Onset  . Atrial fibrillation Mother   .  Arrhythmia Mother   . Diabetes Mother   . Thyroid disease Mother   . Hodgkin's lymphoma Father   . Thyroid disease Daughter        Graves disease  . Diabetes Daughter   . Anxiety disorder Neg Hx     Social History:  reports that she quit smoking about 4 months ago. Her smoking use included cigarettes. She smoked 1.00 pack per day. She has never used smokeless tobacco. She reports that she does not drink alcohol or use drugs.  Allergies:  Allergies  Allergen Reactions  . Jardiance [Empagliflozin] Palpitations    Causes heart palpitations  . Trazodone And Nefazodone Other (See Comments)    "hyped me up, could not sleep"     Review of Systems       Examination:   BP (!) 146/78 (BP Location: Left Arm, Patient Position: Sitting, Cuff Size: Normal)   Pulse 83   Ht 5\' 4"  (1.626 m)   Wt 143 lb 12.8 oz (65.2 kg)   SpO2 97%   BMI 24.68 kg/m       Assessment/Plan:   Hyperthyroidism, subclinical and confirmed to be from Graves' disease   She has had longstanding subclinical hyperthyroidism for many years presenting only as a low TSH  She has been on treatment with 15 mg methimazole but now she reveals that she is not taking this regularly Again has no symptoms of hyperthyroidism and her thyroid levels are controlled As above her thyrotropin receptor body level is above normal recently and likely she will not get into remission on methimazole alone  Again discussed need for doing I-131 for definitive treatment but she is refusing to do so  Recommendations:  Start taking methimazole daily either morning or evening whichever is easier for her to remember  However since she has not taken her 15 mg dose daily she can start with 10 mg on a daily basis  Explained to her that she will likely need I-131 treatment for above reasons if thyroid levels are not improving or we are unable to taper down her methimazole  Follow-up in 6 weeks   DIABETES type II on insulin:  See  history of present illness for detailed discussion of current diabetes management, blood sugar patterns and problems identified  Her most recent A1c was 10.4  She is on inadequate insulin especially mealtime She is not following instructions given for increasing her insulin regimen and has difficulty understanding directions for both basal and mealtime insulin to cover postprandial hyperglycemia proactively Home monitoring is inadequate and she does not keep a record or bring her monitor Lab glucose 227 indicating need to continue increasing insulin  DIABETES RECOMMENDATIONS:  . Glucose monitoring: Patient advised to check readings at least 2-3 times a day either fasting or 2 hours after meals and she will need to keep a record.  Given blood sugar log to write down her blood sugars and insulin doses She is interested in the freestyle libre  and prescription was sent for this, she thinks he can have her daughter help her start this  . Diabetes education: Patient will need to be referred for diabetes education especially insulin information and meal planning as needed, meanwhile will follow instructions given by dietitian  . Lifestyle changes: Needs to start a regular walking program  Insulin/medication instructions:  NOVOLOG insulin: Take this before starting to eat every meal regardless of blood sugar Start with  6 units before dinner If the blood sugar after the meal is still over 180 for that meal increase the dose by at least 2 units the next day  LANTUS insulin: Go up to 15 units starting tonight and if in 3 days the morning sugar is still over 130 go up to 18 units She will need to bring her blood sugar meter or a record of her blood sugars including readings at night after dinner for review in 6 weeks   Patient Instructions  Methimazole 1 daily  NOVOLOG insulin: Take this before starting to eat every meal regardless of blood sugar Start with  6 units before dinner If the  blood sugar after the meal is still over 180 for that meal increase the dose by at least 2 units the next day  LANTUS insulin: Go up to 15 units starting tonight and if in 3 days the morning sugar is still over 130 go up to 18 units      Elayne Snare 08/07/2019, 8:33 PM    Note: This office note was prepared with Dragon voice recognition system technology. Any transcriptional errors that result from this process are unintentional.

## 2019-08-07 NOTE — Patient Instructions (Signed)
Methimazole 1 daily  NOVOLOG insulin: Take this before starting to eat every meal regardless of blood sugar Start with  6 units before dinner If the blood sugar after the meal is still over 180 for that meal increase the dose by at least 2 units the next day  LANTUS insulin: Go up to 15 units starting tonight and if in 3 days the morning sugar is still over 130 go up to 18 units

## 2019-08-08 NOTE — Progress Notes (Signed)
[facility] OFFICE PROGRESS NOTE  [name], NP [place] [address]  DIAGNOSIS:  1) limited stage (T2a, N1, M0) small cell lung cancer presented with right hilar mass in addition to right hilar adenopathy diagnosed in February 2021. 2) incidental acute left-sided pulmonary embolism involving lobar, segmental and subsegmental left lower lobe branches diagnosed in April 2021.  PRIOR THERAPY: None.  CURRENT THERAPY: 1) Systemic chemotherapy with cisplatin 80 mg/M2 on day 1 and etoposide 100 mg/M2 on days 1, 2 and 3 every 3 weeks.  Status post 4 cycles.  This is concurrent with radiotherapy.  Dose related to cisplatin 60 mg/m and etoposide 80 mg/m starting from cycle #4 secondary to cytopenias. 2) Xarelto 15 mg p.o. twice daily for the first 3 weeks followed by 20 mg p.o. daily.  Started June 24 2019.  INTERVAL HISTORY: Christy Abbott 64 y.o. female returns to the clinic today for follow-up visit accompanied by her daughter.  The patient is feeling fairly well today without any concerning complaints.  The patient's chemotherapy treatment has been complicated by severe pancytopenia which in the past is required blood transfusions and platelet transfusions.  Starting from cycle number 4, the patient's dose of cisplatin had been reduced to 60 mg per metered squared and her etoposide had been reduced to 80 mg/m.  The patient required a blood transfusion on 08/05/2019 due to chemotherapy-induced anemia. She noticed her energy has improved somewhat since receiving her blood transfusion. In the interval since her last treatment, the patient also had experienced some nausea and vomiting with associated hypotension.  The patient was seen in the clinic for IV fluids and IV antiemetics.  Today, the patient denies any recent fever, chills, night sweats, or weight loss.  She denies any chest pain, shortness of breath, cough, or hemoptysis.  She denies  diarrhea or constipation. She sometimes experiences nausea and vomiting, particularly when she is taking her pills in the AM. She denies any headache or visual changes.  The patient recently had a restaging CT scan performed. The patient is hoping to be finished with treatment. She is here today for evaluation to review her scan results before starting cycle #5.    MEDICAL HISTORY: Past Medical History:  Diagnosis Date  . Arthritis   . Atrial flutter [facility])    s/p CTI by Dr Lovena Le 02/2013  . Chest pain   . Diabetes mellitus without complication ([place])   . Dysrhythmia    hx of Aflutter  . 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 Hemochromatosis associated with compound heterozygous mutation in HFE gene ([place]) 12/10/2017  . Hyperlipidemia   . Hypertension   . Hyperthyroidism 10/31/2014    ALLERGIES:  is allergic to jardiance [empagliflozin] and trazodone and nefazodone.  MEDICATIONS:  Current Outpatient Medications  Medication Sig Dispense Refill  . Bioflavonoid Products (ESTER C PO) Take 1 tablet by mouth daily.    . Calcium Carb-Cholecalciferol (CALTRATE 600+D3 PO) Take 1 tablet by mouth daily.    . carvedilol (COREG) 12.5 MG tablet Take 1 tablet (12.5 mg total) by mouth in the morning and at bedtime. 180 tablet 3  . Continuous Blood Gluc Sensor (FREESTYLE LIBRE 2 SENSOR) MISC 2 Devices by Does not apply route every 14 (fourteen) days. 2 each 3  . diphenhydramine-acetaminophen (TYLENOL PM) 25-500 MG TABS tablet Take 2 tablets by mouth at bedtime.    . flecainide (TAMBOCOR) 50 MG tablet TAKE 1  TABLET BY MOUTH TWICE DAILY, MAY TAKE AN ADDITIONAL 1 TABLET AS NEEDED. Please keep upcoming appt in January. Thank you (Patient taking differently: Take 50 mg by mouth as directed. Take 50 mg by mouth twice daily, may take a third 50 mg dose as needed for palpitations) 270 tablet 0  . furosemide (LASIX) 20 MG tablet Take 1 tablet (20 mg total) by mouth daily as needed. (Patient taking  differently: Take 20 mg by mouth daily as needed. fluid) 60 tablet 0  . insulin aspart (NOVOLOG) 100 UNIT/ML FlexPen Inject subcutaneously 3 times daily prior to meals per sliding scale 15 mL 1  . insulin glargine (LANTUS) 100 unit/mL SOPN Inject 0.1 mLs (10 Units total) into the skin daily. (Patient taking differently: Inject 10 Units into the skin at bedtime. ) 15 mL 2  . Insulin Pen Needle (B-D UF III MINI PEN NEEDLES) 31G X 5 MM MISC Use with insulin pen 100 each 3  . Lancets 30G MISC Check sugar twice daily. 100 each 3  . lidocaine-prilocaine (EMLA) cream Apply 1 application topically as needed. Using a cotton ball , apply a small amount of cream to the skin over the porta cath site. Do not rub in the cream. Cover with Plastic wrap. 30 g 0  . lisinopril-hydrochlorothiazide (PRINZIDE,ZESTORETIC) 20-25 MG tablet Take 1 tablet by mouth daily. 90 tablet 1  . LORazepam (ATIVAN) 0.5 MG tablet Place 1 tablet (0.5 mg total) under the tongue every 8 (eight) hours as needed (nausea). 30 tablet 0  . magnesium oxide (MAG-OX) 400 (241.3 Mg) MG tablet Take 1 tablet (400 mg total) by mouth 3 (three) times daily. 90 tablet 0  . metFORMIN (GLUCOPHAGE) 500 MG tablet TAKE 2 TABLETS BY MOUTH TWICE DAILY . APPOINTMENT REQUIRED FOR FUTURE REFILLS 120 tablet 0  . methimazole (TAPAZOLE) 10 MG tablet Take 1 tablet (10 mg total) by mouth daily. 90 tablet 0  . Omega-3 Fatty Acids (FISH OIL) 1200 MG CAPS Take 1,200 mg by mouth daily.     . ondansetron (ZOFRAN) 4 MG tablet Take 1 tablet (4 mg total) by mouth every 8 (eight) hours as needed for nausea or vomiting. 20 tablet 0  . potassium chloride SA (KLOR-CON) 20 MEQ tablet TAKE 1  BY MOUTH ONCE DAILY 90 tablet 0  . prochlorperazine (COMPAZINE) 10 MG tablet Take 1 tablet (10 mg total) by mouth every 6 (six) hours as needed for nausea or vomiting. 30 tablet 0  . Rivaroxaban Stater Pack, 15 mg and 20 mg, (XARELTO STARTER PACK) Follow package directions: Take one 15mg  tablet by  mouth twice a day. On day 22, switch to one 20mg  tablet once a day. Take with food. 51 each 0  . simvastatin (ZOCOR) 10 MG tablet TAKE 1 TABLET BY MOUTH AT BEDTIME 90 tablet 1  . Tetrahydrozoline HCl (VISINE OP) Place 1 drop into both eyes daily as needed (irritation).    Marland Kitchen albuterol (VENTOLIN HFA) 108 (90 Base) MCG/ACT inhaler Inhale 2 puffs into the lungs every 6 (six) hours as needed for wheezing or shortness of breath. (Patient not taking: Reported on 08/13/2019) 6.7 g 0   No current facility-administered medications for this visit.    SURGICAL HISTORY:  Past Surgical History:  Procedure Laterality Date  . ABLATION  03-04-2013   CTI by Dr Lovena Le for atrial flutter  . ATRIAL FLUTTER ABLATION N/A 03/04/2013   Procedure: ATRIAL FLUTTER ABLATION;  Surgeon: Evans Lance, MD;  Location[facility] CATH LAB;  Service: Cardiovascular;  Laterality: N/A;  . CHOLECYSTECTOMY  1982  . COLONOSCOPY W/ POLYPECTOMY     x 2  . ESOPHAGOGASTRODUODENOSCOPY N/A 05/20/2014   Procedure: ESOPHAGOGASTRODUODENOSCOPY (EGD);  Surgeon: Teena Irani, MD;  Location: [facility] ENDOSCOPY;  Service: Endoscopy;  Laterality: N/A;  . IR IMAGING GUIDED PORT INSERTION  05/09/2019  . TONSILLECTOMY  1972 ?  Marland Kitchen VIDEO BRONCHOSCOPY WITH ENDOBRONCHIAL ULTRASOUND Left 04/30/2019   Procedure: VIDEO BRONCHOSCOPY WITH ENDOBRONCHIAL ULTRASOUND;  Surgeon: Garner Nash, DO;  Location: [facility];  Service: Pulmonary;  Laterality: Left;    REVIEW OF SYSTEMS:   Review of Systems  Constitutional: Positive for fatigue. Negative for appetite change, chills, fatigue, fever and unexpected weight change.  HENT: Negative for mouth sores, nosebleeds, sore throat and trouble swallowing.   Eyes: Negative for eye problems and icterus.  Respiratory: Negative for cough, hemoptysis, shortness of breath and wheezing.   Cardiovascular: Negative for chest pain and leg swelling.  Gastrointestinal: Positive for occasional nausea/vomiting. Negative for abdominal pain,  constipation, and diarrhea.  Genitourinary: Negative for bladder incontinence, difficulty urinating, dysuria, frequency and hematuria.   Musculoskeletal: Negative for back pain, gait problem, neck pain and neck stiffness.  Skin: Negative for itching and rash.  Neurological: Negative for dizziness, extremity weakness, gait problem, headaches, light-headedness and seizures.  Hematological: Negative for adenopathy. Does not bruise/bleed easily.  Psychiatric/Behavioral: Negative for confusion, depression and sleep disturbance. The patient is not nervous/anxious.     PHYSICAL EXAMINATION:  Blood pressure (!) 146/81, pulse 88, temperature 97.8 F (36.6 C), temperature source Temporal, resp. rate 16, height 5\' 4"  (1.626 m), weight 146 lb (66.2 kg), SpO2 100 %.  ECOG PERFORMANCE STATUS: 1 - Symptomatic but completely ambulatory  Physical Exam  Constitutional: Oriented to person, place, and time and well-developed, well-nourished, and in no distress.  HENT:  Head: Normocephalic and atraumatic.  Mouth/Throat: Oropharynx is clear and moist. No oropharyngeal exudate.  Eyes: Conjunctivae are normal. Right eye exhibits no discharge. Left eye exhibits no discharge. No scleral icterus.  Neck: Normal range of motion. Neck supple.  Cardiovascular: Normal rate, regular rhythm, normal heart sounds and intact distal pulses.   Pulmonary/Chest: Effort normal and breath sounds normal. No respiratory distress. No wheezes. No rales.  Abdominal: Soft. Bowel sounds are normal. Exhibits no distension and no mass. There is no tenderness.  Musculoskeletal: Normal range of motion. Exhibits no edema.  Lymphadenopathy:    No cervical adenopathy.  Neurological: Alert and oriented to person, place, and time. Exhibits normal muscle tone. Gait normal. Coordination normal.  Skin: Skin is warm and dry. No rash noted. Not diaphoretic. No erythema. No pallor.  Psychiatric: Mood, memory and judgment normal.  Vitals  reviewed.  LABORATORY DATA: Lab Results  Component Value Date   WBC 8.6 08/13/2019   HGB 8.7 (L) 08/13/2019   HCT 26.9 (L) 08/13/2019   MCV 99.3 08/13/2019   PLT 105 (L) 08/13/2019      Chemistry      Component Value Date/Time   NA 143 08/13/2019 0810   NA 140 02/21/2018 0955   K 4.3 08/13/2019 0810   CL 105 08/13/2019 0810   CO2 26 08/13/2019 0810   BUN 17 08/13/2019 0810   BUN 14 02/21/2018 0955   CREATININE 0.95 08/13/2019 0810   CREATININE 0.82 01/22/2015 1556      Component Value Date/Time   CALCIUM 9.2 08/13/2019 0810   ALKPHOS 126 08/13/2019 0810   AST 8 (L) 08/13/2019 0810   ALT 7 08/13/2019 0810   BILITOT  0.5 08/13/2019 0810       RADIOGRAPHIC STUDIES:  CT Chest W Contrast  Result Date: 08/12/2019 CLINICAL DATA:  Small cell lung cancer limited stage, additional workup initially diagnosed in early 2021, post chemo and radiotherapy by report. EXAM: CT CHEST WITH CONTRAST TECHNIQUE: Multidetector CT imaging of the chest was performed during intravenous contrast administration. CONTRAST:  66mL OMNIPAQUE IOHEXOL 300 MG/ML  SOLN COMPARISON:  06/24/2019 FINDINGS: Cardiovascular: Calcified atheromatous plaque throughout the thoracic aorta. No aneurysmal dilation. Heart size is normal with signs of calcified coronary artery disease as before. Decreased clot in LEFT pulmonary artery grossly as compared to the prior study. Limited assessment on venous phase imaging still with some evidence of web-like and eccentric changes in the LEFT lower lobe branches. Mediastinum/Nodes: Thoracic inlet structures are normal. RIGHT IJ central venous catheter terminates in the upper RIGHT atrium. No mediastinal or hilar lymphadenopathy. Lungs/Pleura: Pulmonary emphysema. No dense consolidation. No pleural effusion. Airways are patent. Minimal nonmeasurable soft tissue in the region of the LEFT hilum. Small nodule in the central LEFT upper lobe seen on previous imaging has resolved. No new  pulmonary nodules. LEFT lower lobe nodularity continues to diminish. (Image 100, series 7) 5 mm but with less density than on the prior imaging study. (Image 57, series 7) 6 mm RIGHT lower lobe, superior segment juxtapleural nodule similar to the prior study. Upper Abdomen: Adrenal glands are normal. Post cholecystectomy, partially imaged. Imaged portions of the pancreas and spleen are unremarkable. No acute upper abdominal process. Musculoskeletal: No acute bone finding or destructive bone process. IMPRESSION: 1. Diminished thrombus in LEFT lower lobe pulmonary arterial branches suggested on the current study, not well evaluated on venous phase evaluation. 2. Continued decrease in size of very subtle areas of nodularity following treatment. Dominant area in the LEFT hilar region without measurable soft tissue as on the previous exam. 3. Emphysema and aortic atherosclerosis. Aortic Atherosclerosis (ICD10-I70.0) and Emphysema (ICD10-J43.9). Electronically Signed   By: Zetta Bills M.D.   On: 08/12/2019 12:50     ASSESSMENT/PLAN:  This is a very pleasant 64 year old Caucasian female diagnosed with limited stage small cell lung cancer.  She presented with a right hilar mass and associated right hilar lymphadenopathy.  She was diagnosed in February 2021.  She is currently undergoing treatment with systemic chemotherapy with cisplatin 80 mg per metered squared on days 1 and etoposide 100 mg per metered squared on days 1, 2, and 3 IV every 3 weeks with concurrent radiation.  She is status post 4 cycles.    The patient recently had a restaging CT scan performed.  Dr. Julien Nordmann personally and independently reviewed the scan and discussed the results with the patient today.  The scan showed a continued interval response to treatment with continued decrease in the size of the subtle areas of nodularity. Dr. Julien Nordmann had a lengthy discussion with the patient about her current condition and treatment options of stopping  here with a total of 4 cycles of chemotherapy vs continuing with 1-2 more cycles. Using shared decision making and given the patient's severe pancytopenia's, decided discontinue treatment with the patient having completed 4 cycles.   The patient has a follow up appointment with radiation oncologist, Dr. Sondra Come on 08/25/19 where they will discuss possible prophylactic cranial irradiation. Dr. Julien Nordmann and the patient discussed the purpose of PCI during her visit today; however, she will have a more detailed discussion with Dr. Sondra Come when she sees him in a few weeks.   I will  arrange for a restaging CT scan of her chest in 3 months. I will arrange for flush appointments every 6 weeks and will arrange for labs to be done within 6 weeks of her scan.  We will see her back for follow-up visit in 3 months for evaluation and to review her scan results.   The patient will continue to take Xarelto for her recent incidental finding of a pulmonary embolism in the left lower lobe.  She will continue to take magnesium for her hypomagnesemia.   The patient was advised to call immediately if she has any concerning symptoms in the interval. The patient voices understanding of current disease status and treatment options and is in agreement with the current care plan. All questions were answered. The patient knows to call the clinic with any problems, questions or concerns. We can certainly see the patient much sooner if necessary      Orders Placed This Encounter  Procedures  . CT Chest W Contrast    Standing Status:   Future    Standing Expiration Date:   08/12/2020    Order Specific Question:   ** REASON FOR EXAM (FREE TEXT)    Answer:   Restaging History of Small Cell Lung Cancer    Order Specific Question:   If indicated for the ordered procedure, I authorize the administration of contrast media per Radiology protocol    Answer:   Yes    Order Specific Question:   Preferred imaging location?    Answer:    [facility]    Order Specific Question:   Radiology Contrast Protocol - do NOT remove file path    Answer:   [email or web address]  . CBC with Differential ([place] Only)    Standing Status:   Future    Standing Expiration Date:   08/12/2020  . CMP ([place] only)    Standing Status:   Future    Standing Expiration Date:   08/12/2020     Tobe Sos Sephiroth Mcluckie, PA-C 08/13/19  ADDENDUM: Hematology/Oncology Attending: I had a face-to-face encounter with the patient today.  I recommended her care plan.  This is a very pleasant 64 years old white female with limited stage small cell lung cancer presented with right hilar mass and right hilar lymphadenopathy diagnosed in February 2021.  The patient underwent a course of systemic chemotherapy with cisplatin and etoposide status post 4 cycles.  This was concurrent with radiotherapy for 6 weeks during the treatment. The patient tolerated the first 2 cycles of her treatment well with no concerning adverse effect but the last 2 cycles she has significant pancytopenia requiring PRBCs transfusion. She had repeat CT scan of the chest performed recently.  I personally and independently reviewed the scan images and discussed the results with the patient and her daughter. Her scan showed further improvement of her disease. I had a lengthy discussion with the patient today about her condition.  I recommended for her to stop her systemic chemotherapy at this point. I will continue to monitor her closely with repeat CT scan of the chest every 3 months for the next year and if no evidence for progression, we will decrease the frequency of the scan. I also recommend for the patient to discuss with Dr. Sondra Come consideration of prophylactic cranial irradiation when she sees him in less than 2 weeks. The patient and her daughter agreed to the current plan. She was advised to call immediately if she has any concerning  symptoms in  the interval.  Disclaimer: This note was dictated with voice recognition software. Similar sounding words can inadvertently be transcribed and may be missed upon review. Eilleen Kempf, MD 08/13/19

## 2019-08-09 ENCOUNTER — Ambulatory Visit: Payer: 59

## 2019-08-11 ENCOUNTER — Encounter: Payer: Self-pay | Admitting: Internal Medicine

## 2019-08-12 ENCOUNTER — Other Ambulatory Visit: Payer: Self-pay

## 2019-08-12 ENCOUNTER — Ambulatory Visit (HOSPITAL_COMMUNITY)
Admission: RE | Admit: 2019-08-12 | Discharge: 2019-08-12 | Disposition: A | Discharge status: Home / Self Care | Payer: 59 | Source: Ambulatory Visit | Attending: Internal Medicine | Admitting: Internal Medicine

## 2019-08-12 ENCOUNTER — Encounter (HOSPITAL_COMMUNITY): Payer: Self-pay

## 2019-08-12 DIAGNOSIS — C3490 Malignant neoplasm of unspecified part of unspecified bronchus or lung: Secondary | ICD-10-CM | POA: Diagnosis not present

## 2019-08-12 IMAGING — CT CT CHEST W/ CM
2 of 4 series · 15 of 36 positions shown, 18 images · IV contrast (OMNIPAQUE)
Comparison: [DATE]

CLINICAL DATA: Small cell lung cancer limited stage, additional
workup initially diagnosed in early [RK], post chemo and
radiotherapy by report.

EXAM:
CT CHEST WITH CONTRAST
TECHNIQUE: Multidetector CT imaging of the chest was performed during
intravenous contrast administration.
CONTRAST:  75mL OMNIPAQUE IOHEXOL 300 MG/ML  SOLN

[Series 2: axial st · axial · 0.70mm/px · z∈[-277,-5]mm · 12 of 160 slices shown, 15 images]
[im 12/160  mediastinal]
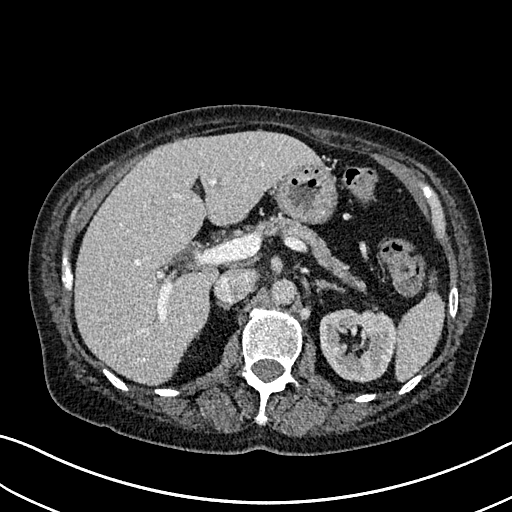
[im 12/160  lung]
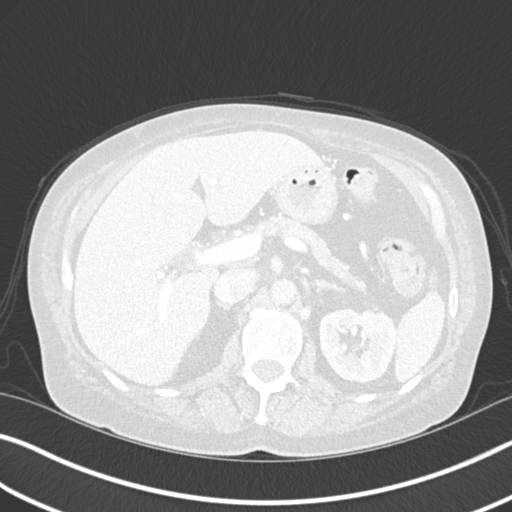
[im 23/160  lung]
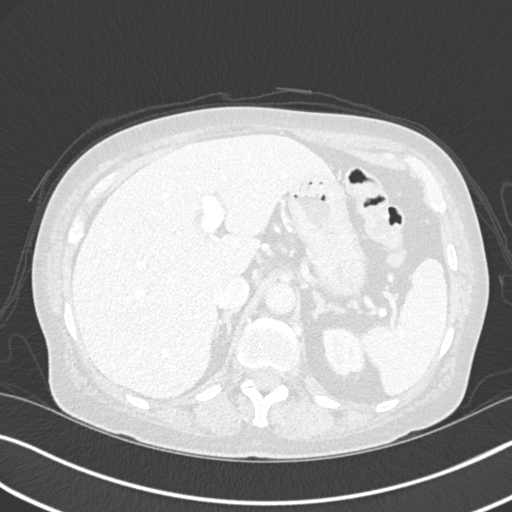
[im 35/160  lung]
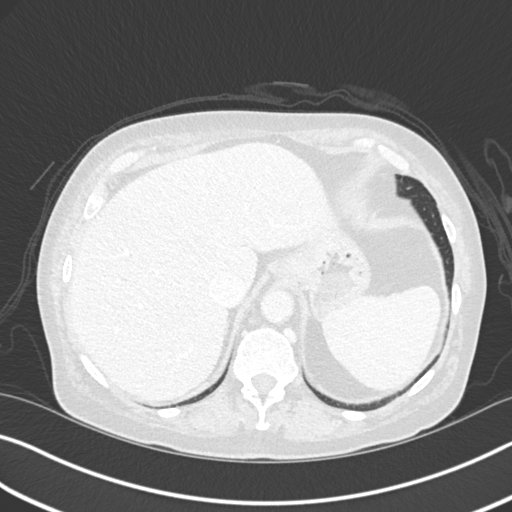
[im 46/160  lung]
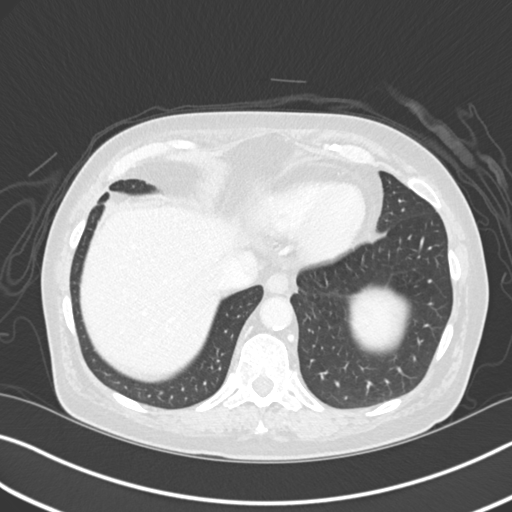
[im 57/160  mediastinal]
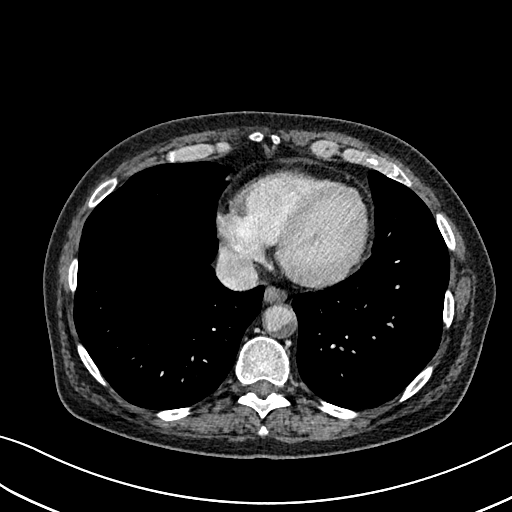
[im 57/160  lung]
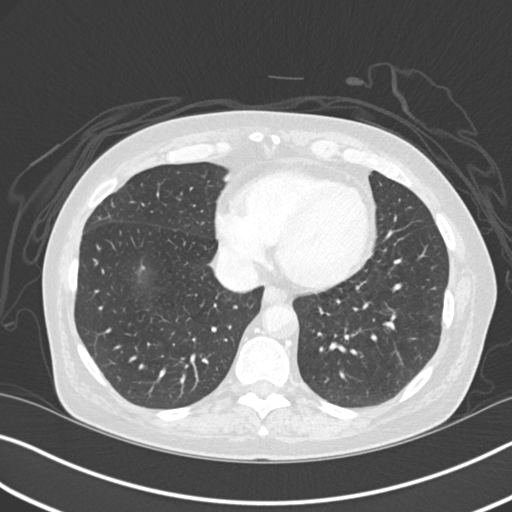
[im 69/160  lung]
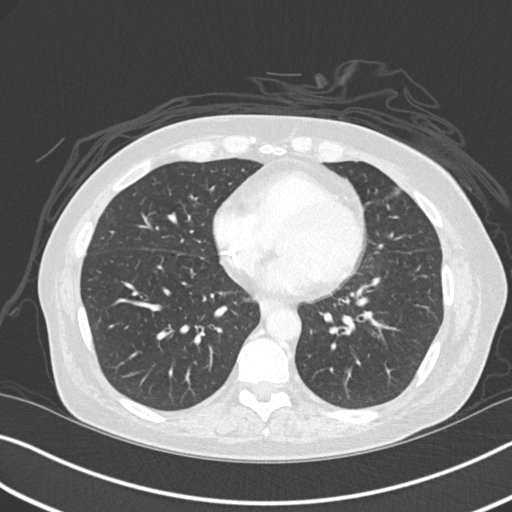
[im 91/160  lung]
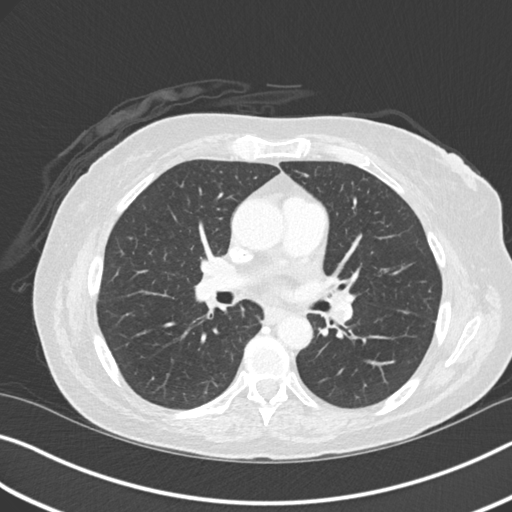
[im 103/160  lung]
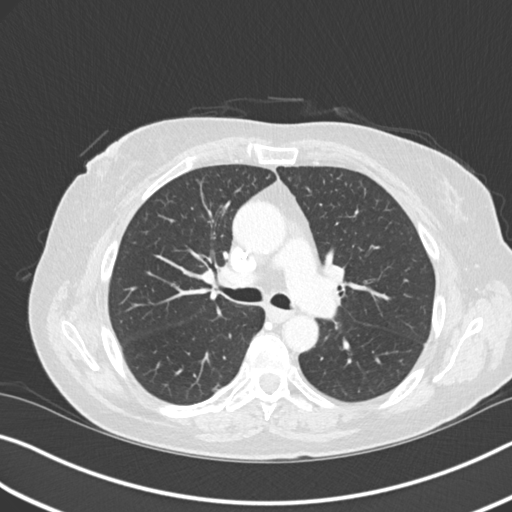
[im 114/160  mediastinal]
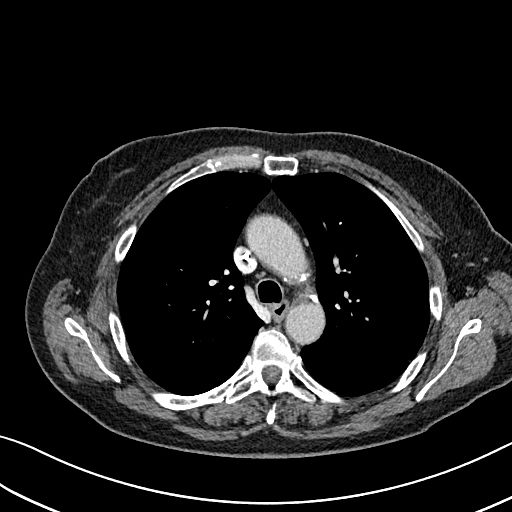
[im 114/160  lung]
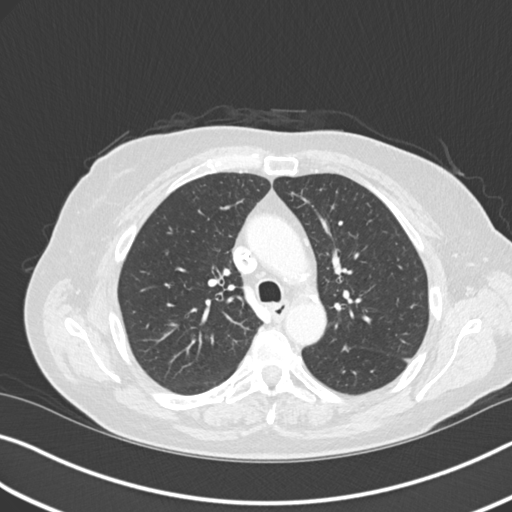
[im 125/160  lung]
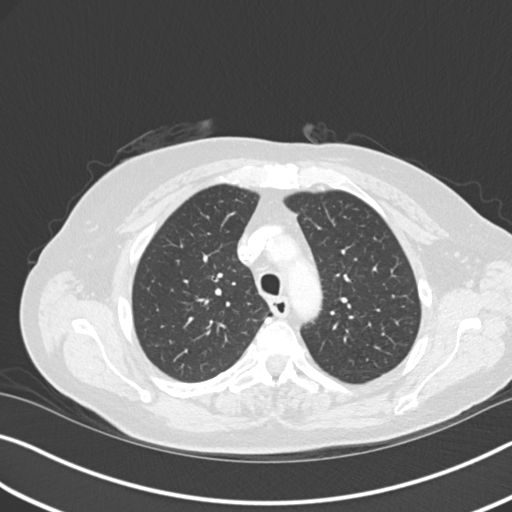
[im 137/160  lung]
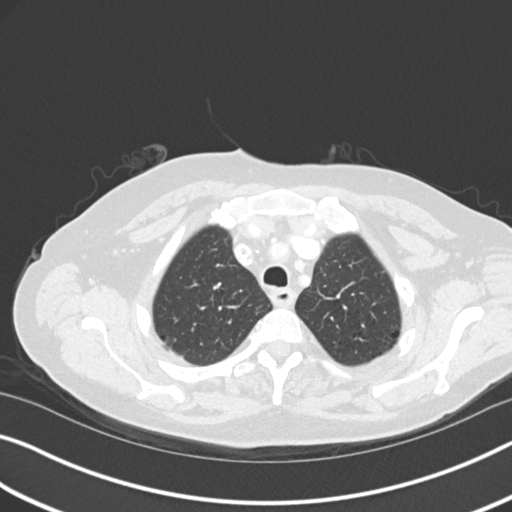
[im 148/160  lung]
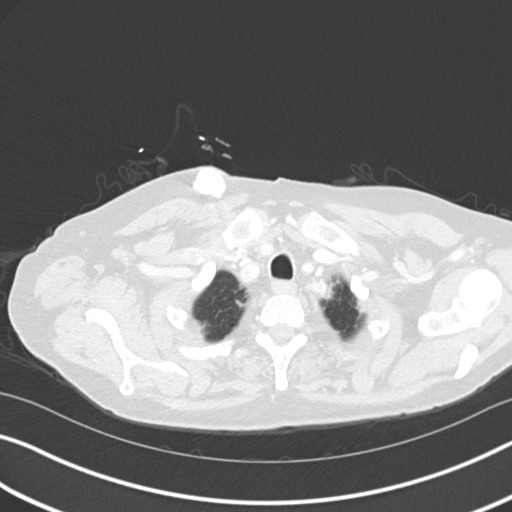

[Series 5: coronal · coronal · 0.63mm/px · 3 of 130 slices shown]
[im 26/130  lung]
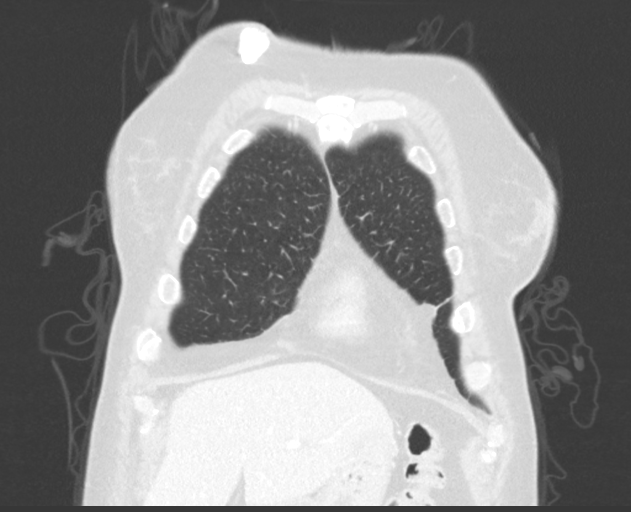
[im 52/130  lung]
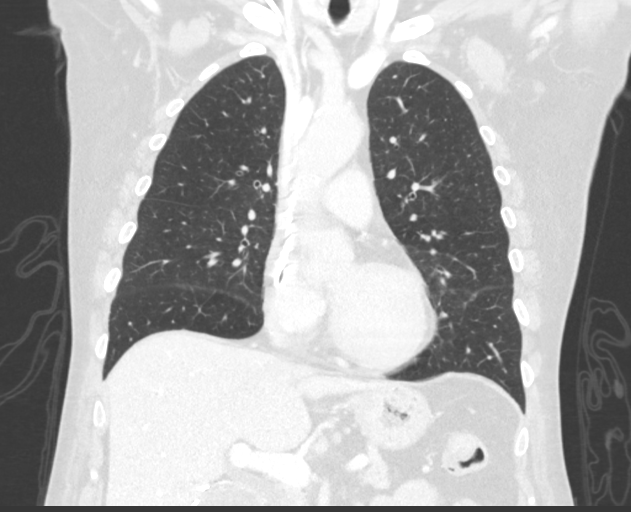
[im 78/130  lung]
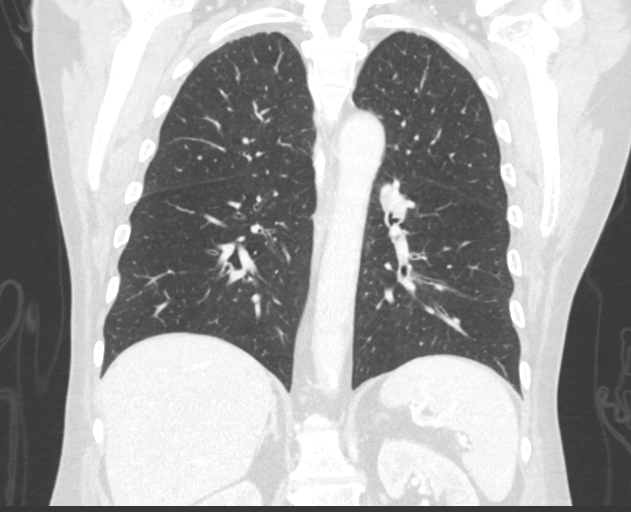

[15 of 36 positions shown; findings below may reference images not displayed]

FINDINGS: Cardiovascular: Calcified atheromatous plaque throughout the
thoracic aorta. No aneurysmal dilation. Heart size is normal with
signs of calcified coronary artery disease as before.

Decreased clot in LEFT pulmonary artery grossly as compared to the
prior study. Limited assessment on venous phase imaging still with
some evidence of web-like and eccentric changes in the LEFT lower
lobe branches.

Mediastinum/Nodes: Thoracic inlet structures are normal. RIGHT IJ
central venous catheter terminates in the upper RIGHT atrium. No
mediastinal or hilar lymphadenopathy.

Lungs/Pleura: Pulmonary emphysema. No dense consolidation. No
pleural effusion. Airways are patent.

Minimal nonmeasurable soft tissue in the region of the LEFT hilum.
Small nodule in the central LEFT upper lobe seen on previous imaging
has resolved. No new pulmonary nodules.

LEFT lower lobe nodularity continues to diminish. (Image 100, series
7) 5 mm but with less density than on the prior imaging study.

(Image 57, series 7) 6 mm RIGHT lower lobe, superior segment
juxtapleural nodule similar to the prior study.

Upper Abdomen: Adrenal glands are normal. Post cholecystectomy,
partially imaged. Imaged portions of the pancreas and spleen are
unremarkable. No acute upper abdominal process.

Musculoskeletal: No acute bone finding or destructive bone process.
IMPRESSION: 1. Diminished thrombus in LEFT lower lobe pulmonary arterial
branches suggested on the current study, not well evaluated on
venous phase evaluation.
2. Continued decrease in size of very subtle areas of nodularity
following treatment. Dominant area in the LEFT hilar region without
measurable soft tissue as on the previous exam.
3. Emphysema and aortic atherosclerosis.

Aortic Atherosclerosis ([RK]-[RK]) and Emphysema ([RK]-[RK]).

## 2019-08-12 MED ORDER — HEPARIN SOD (PORK) LOCK FLUSH 100 UNIT/ML IV SOLN
INTRAVENOUS | Status: AC
  Administered 2019-08-12: 500 [IU] via INTRAVENOUS
  Filled 2019-08-12: qty 5

## 2019-08-12 MED ORDER — IOHEXOL 300 MG/ML  SOLN
75.0000 mL | Freq: Once | INTRAMUSCULAR | Status: AC | PRN
  Administered 2019-08-12: 75 mL via INTRAVENOUS

## 2019-08-12 MED ORDER — HEPARIN SOD (PORK) LOCK FLUSH 100 UNIT/ML IV SOLN: 500.0000 [IU] | Freq: Once | INTRAVENOUS | Status: AC

## 2019-08-12 MED ORDER — SODIUM CHLORIDE (PF) 0.9 % IJ SOLN
INTRAMUSCULAR | Status: AC
  Filled 2019-08-12: qty 50

## 2019-08-13 ENCOUNTER — Inpatient Hospital Stay: Payer: 59

## 2019-08-13 ENCOUNTER — Inpatient Hospital Stay: Payer: 59 | Attending: Radiation Oncology | Admitting: Physician Assistant

## 2019-08-13 ENCOUNTER — Encounter: Payer: Self-pay | Admitting: Physician Assistant

## 2019-08-13 ENCOUNTER — Other Ambulatory Visit: Payer: Self-pay

## 2019-08-13 VITALS — BP 146/81 | HR 88 | Temp 97.8°F | Resp 16 | Ht 64.0 in | Wt 146.0 lb

## 2019-08-13 DIAGNOSIS — E785 Hyperlipidemia, unspecified: Secondary | ICD-10-CM | POA: Diagnosis not present

## 2019-08-13 DIAGNOSIS — J439 Emphysema, unspecified: Secondary | ICD-10-CM | POA: Insufficient documentation

## 2019-08-13 DIAGNOSIS — C3490 Malignant neoplasm of unspecified part of unspecified bronchus or lung: Secondary | ICD-10-CM | POA: Diagnosis not present

## 2019-08-13 DIAGNOSIS — I10 Essential (primary) hypertension: Secondary | ICD-10-CM | POA: Diagnosis not present

## 2019-08-13 DIAGNOSIS — Z79899 Other long term (current) drug therapy: Secondary | ICD-10-CM | POA: Insufficient documentation

## 2019-08-13 DIAGNOSIS — Z794 Long term (current) use of insulin: Secondary | ICD-10-CM | POA: Diagnosis not present

## 2019-08-13 DIAGNOSIS — Z86711 Personal history of pulmonary embolism: Secondary | ICD-10-CM | POA: Diagnosis not present

## 2019-08-13 DIAGNOSIS — C3492 Malignant neoplasm of unspecified part of left bronchus or lung: Secondary | ICD-10-CM | POA: Diagnosis not present

## 2019-08-13 DIAGNOSIS — Z7189 Other specified counseling: Secondary | ICD-10-CM | POA: Diagnosis not present

## 2019-08-13 DIAGNOSIS — E119 Type 2 diabetes mellitus without complications: Secondary | ICD-10-CM | POA: Insufficient documentation

## 2019-08-13 DIAGNOSIS — I4892 Unspecified atrial flutter: Secondary | ICD-10-CM | POA: Insufficient documentation

## 2019-08-13 LAB — CBC WITH DIFFERENTIAL (CANCER CENTER ONLY)
Abs Immature Granulocytes: 0.12 10*3/uL — ABNORMAL HIGH (ref 0.00–0.07)
Basophils Absolute: 0 10*3/uL (ref 0.0–0.1)
Basophils Relative: 0 %
Eosinophils Absolute: 0 10*3/uL (ref 0.0–0.5)
Eosinophils Relative: 1 %
HCT: 26.9 % — ABNORMAL LOW (ref 36.0–46.0)
Hemoglobin: 8.7 g/dL — ABNORMAL LOW (ref 12.0–15.0)
Immature Granulocytes: 1 %
Lymphocytes Relative: 17 %
Lymphs Abs: 1.4 10*3/uL (ref 0.7–4.0)
MCH: 32.1 pg (ref 26.0–34.0)
MCHC: 32.3 g/dL (ref 30.0–36.0)
MCV: 99.3 fL (ref 80.0–100.0)
Monocytes Absolute: 0.9 10*3/uL (ref 0.1–1.0)
Monocytes Relative: 11 %
Neutro Abs: 6.1 10*3/uL (ref 1.7–7.7)
Neutrophils Relative %: 70 %
Platelet Count: 105 10*3/uL — ABNORMAL LOW (ref 150–400)
RBC: 2.71 MIL/uL — ABNORMAL LOW (ref 3.87–5.11)
RDW: 17.8 % — ABNORMAL HIGH (ref 11.5–15.5)
WBC Count: 8.6 10*3/uL (ref 4.0–10.5)
nRBC: 0.6 % — ABNORMAL HIGH (ref 0.0–0.2)

## 2019-08-13 LAB — CMP (CANCER CENTER ONLY)
ALT: 7 U/L (ref 0–44)
AST: 8 U/L — ABNORMAL LOW (ref 15–41)
Albumin: 3.3 g/dL — ABNORMAL LOW (ref 3.5–5.0)
Alkaline Phosphatase: 126 U/L (ref 38–126)
Anion gap: 12 (ref 5–15)
BUN: 17 mg/dL (ref 8–23)
CO2: 26 mmol/L (ref 22–32)
Calcium: 9.2 mg/dL (ref 8.9–10.3)
Chloride: 105 mmol/L (ref 98–111)
Creatinine: 0.95 mg/dL (ref 0.44–1.00)
GFR, Est AFR Am: >60 mL/min (ref >60)
GFR, Estimated: >60 mL/min (ref >60)
Glucose, Bld: 199 mg/dL — ABNORMAL HIGH (ref 70–99)
Potassium: 4.3 mmol/L (ref 3.5–5.1)
Sodium: 143 mmol/L (ref 135–145)
Total Bilirubin: 0.5 mg/dL (ref 0.3–1.2)
Total Protein: 6.1 g/dL — ABNORMAL LOW (ref 6.5–8.1)

## 2019-08-13 LAB — MAGNESIUM: Magnesium: 1.5 mg/dL — ABNORMAL LOW (ref 1.7–2.4)

## 2019-08-14 ENCOUNTER — Inpatient Hospital Stay: Payer: 59

## 2019-08-15 ENCOUNTER — Inpatient Hospital Stay: Payer: 59

## 2019-08-16 ENCOUNTER — Inpatient Hospital Stay: Payer: 59

## 2019-08-22 ENCOUNTER — Telehealth: Payer: Self-pay | Admitting: *Deleted

## 2019-08-22 ENCOUNTER — Telehealth: Payer: Self-pay | Admitting: Physician Assistant

## 2019-08-22 NOTE — Telephone Encounter (Signed)
CALLED PATIENT TO ASK ABOUT MISSING MRI, NO ANSWER, UNABLE TO LEAVE MESSAGE, VM FULL, CANCELLED FU APPT. WITH DR. KINARD ON 08-25-19, DUE TO MISSING MRI

## 2019-08-22 NOTE — Telephone Encounter (Signed)
Scheduled per los. Called and left msg. Mailed printout  °

## 2019-08-25 ENCOUNTER — Telehealth: Payer: Self-pay | Admitting: *Deleted

## 2019-08-25 ENCOUNTER — Ambulatory Visit: Payer: Self-pay | Admitting: Radiation Oncology

## 2019-08-25 NOTE — Telephone Encounter (Signed)
CALLED PATIENT TO INFORM OF MRI FOR 09-04-19 - ARRIVAL TIME- 11:30 AM @ [facility], NO RESTRICTIONS TO TEST AND PATIENT TO FU WITH DR. KINARD ON 09-11-19 @ 8:45 AM FOR RESULTS, SPOKE WITH PATIENT AND SHE IS AWARE OF THESE APPTS.

## 2019-09-03 ENCOUNTER — Other Ambulatory Visit: Payer: Self-pay

## 2019-09-03 ENCOUNTER — Other Ambulatory Visit: Payer: Self-pay | Admitting: Family

## 2019-09-03 DIAGNOSIS — R002 Palpitations: Secondary | ICD-10-CM

## 2019-09-03 MED ORDER — FLECAINIDE ACETATE 50 MG PO TABS: ORAL_TABLET | ORAL | 2 refills | Status: DC

## 2019-09-03 NOTE — Telephone Encounter (Signed)
Pt's medication was sent to pt's pharmacy as requested. Confirmation received.  °

## 2019-09-04 ENCOUNTER — Ambulatory Visit (HOSPITAL_COMMUNITY)
Admission: RE | Admit: 2019-09-04 | Discharge: 2019-09-04 | Disposition: A | Discharge status: Home / Self Care | Payer: 59 | Source: Ambulatory Visit | Attending: Radiation Oncology | Admitting: Radiation Oncology

## 2019-09-04 ENCOUNTER — Other Ambulatory Visit: Payer: Self-pay

## 2019-09-04 DIAGNOSIS — C3490 Malignant neoplasm of unspecified part of unspecified bronchus or lung: Secondary | ICD-10-CM | POA: Diagnosis present

## 2019-09-04 IMAGING — MR MR HEAD WO/W CM
14 series · 48 of 48 positions shown · IV contrast (gadavist)
Comparison: MRI of the brain [DATE]

CLINICAL DATA: Small-cell lung cancer.  Assess treatment response.

EXAM:
MRI HEAD WITHOUT AND WITH CONTRAST
TECHNIQUE: Multiplanar, multiecho pulse sequences of the brain and surrounding
structures were obtained without and with intravenous contrast.
CONTRAST:  6mL GADAVIST GADOBUTROL 1 MMOL/ML IV SOLN

[Series 5: DWI · axial · 3.0mm · 1.36mm/px · z∈[-39,+114]mm · 6 of 104 slices shown (1 of 4)]
[im 1/104]
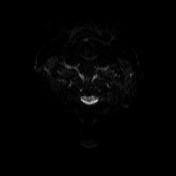
[im 21/104]
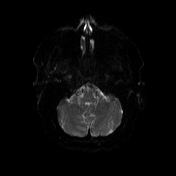
[im 42/104]
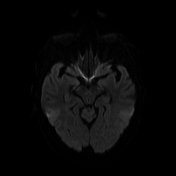
[im 62/104]
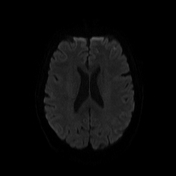
[im 83/104]
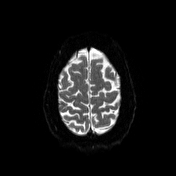
[im 104/104]
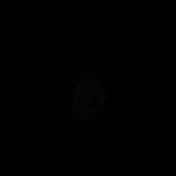

[Series 6: DWI · axial · 3.0mm · 1.36mm/px · z∈[-39,+114]mm · 3 of 52 slices shown (2 of 4)]
[im 1/52]
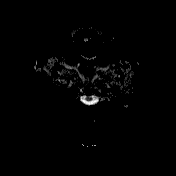
[im 26/52]
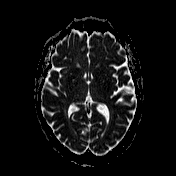
[im 52/52]
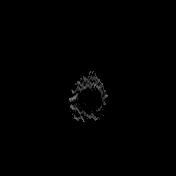

[Series 7: T1 · sagittal · 5.0mm · 0.75mm/px · 1 of 24 slices shown (1 of 2)]
[im 1/24]
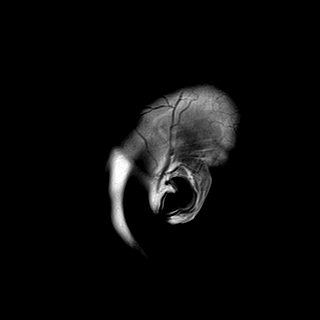

[Series 8: T2 · axial · 5.0mm · 0.62mm/px · 1 of 26 slices shown]
[im 1/26]
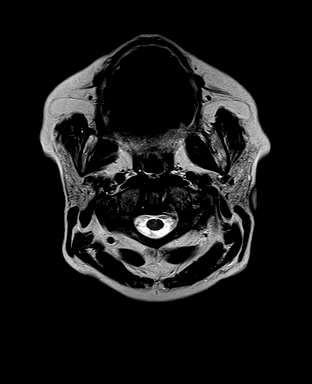

[Series 9: mip_images(sw) · axial · 24.0mm · 0.75mm/px · z∈[-32,+100]mm · 2 of 45 slices shown]
[im 1/45]
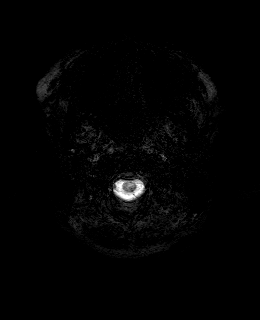
[im 45/45]
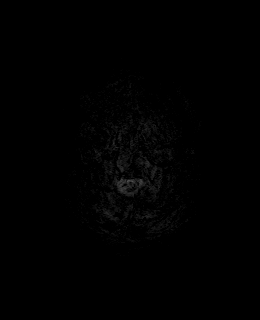

[Series 10: swi_images · axial · 3.0mm · 0.75mm/px · z∈[-42,+111]mm · 3 of 52 slices shown]
[im 1/52]
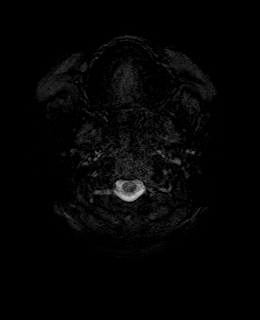
[im 26/52]
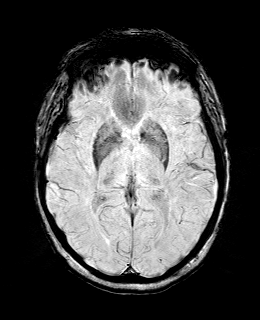
[im 52/52]
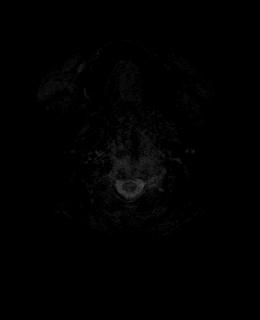

[Series 11: FLAIR · axial · 3.0mm · 0.75mm/px · z∈[-42,+111]mm · 3 of 52 slices shown]
[im 1/52]
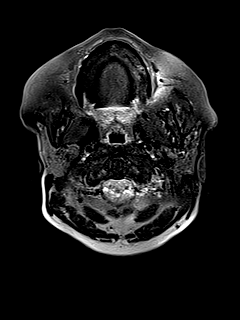
[im 26/52]
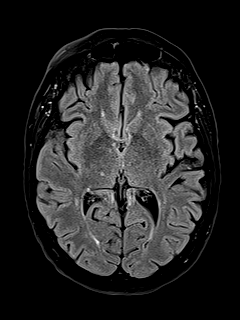
[im 52/52]
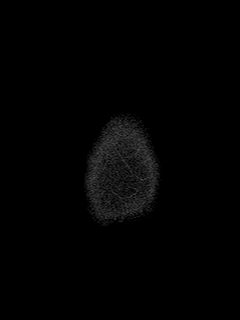

[Series 12: T1 · axial · 1.0mm · 0.94mm/px · z∈[-45,+114]mm · 9 of 160 slices shown (2 of 2)]
[im 1/160]
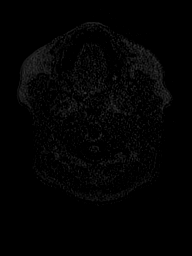
[im 20/160]
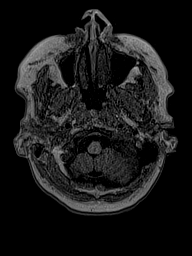
[im 40/160]
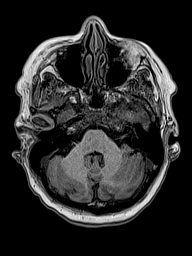
[im 60/160]
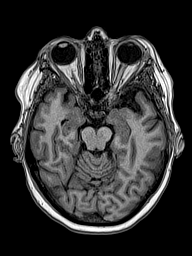
[im 80/160]
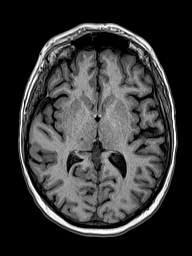
[im 100/160]
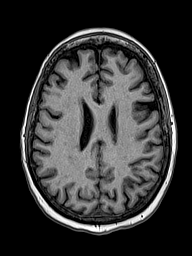
[im 120/160]
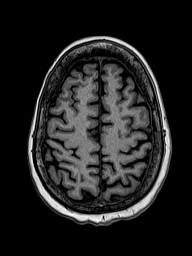
[im 140/160]
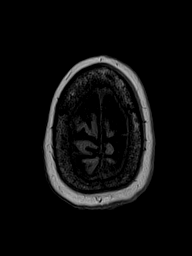
[im 160/160]
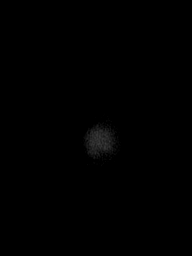

[Series 13: DWI · coronal · 5.0mm · 1.31mm/px · 4 of 75 slices shown (3 of 4)]
[im 1/75]
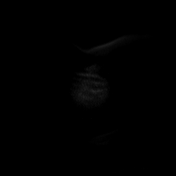
[im 25/75]
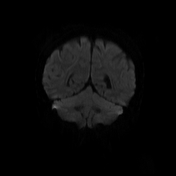
[im 50/75]
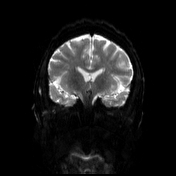
[im 75/75]
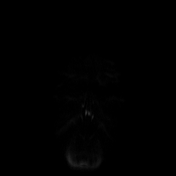

[Series 14: DWI · coronal · 5.0mm · 1.31mm/px · 2 of 38 slices shown (4 of 4)]
[im 1/38]
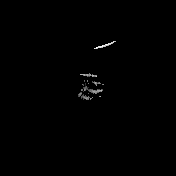
[im 38/38]
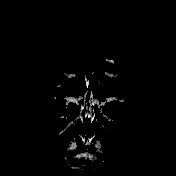

[Series 15: T2 post-contrast · coronal · 5.0mm · 0.57mm/px · 2 of 30 slices shown]
[im 1/30]
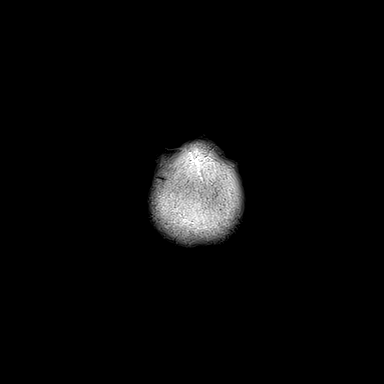
[im 30/30]
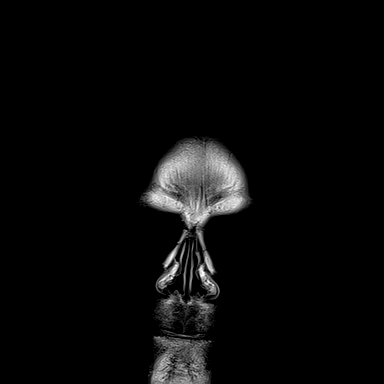

[Series 16: T1 post-contrast · axial · 1.0mm · 0.94mm/px · z∈[-48,+111]mm · 9 of 160 slices shown (1 of 3)]
[im 1/160]
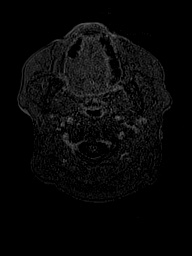
[im 20/160]
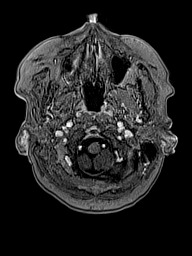
[im 40/160]
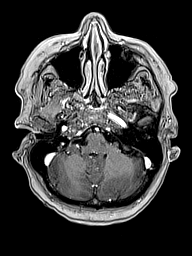
[im 60/160]
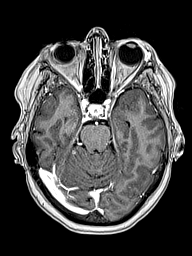
[im 80/160]
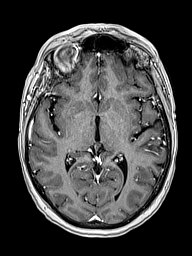
[im 100/160]
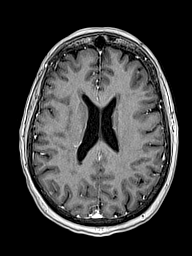
[im 120/160]
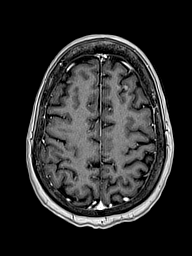
[im 140/160]
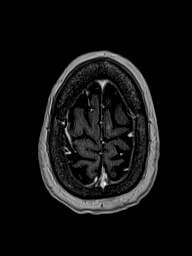
[im 160/160]
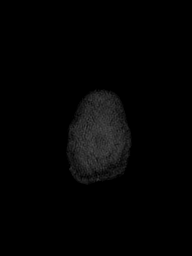

[Series 17: T1 post-contrast · coronal · 5.0mm · 0.43mm/px · 2 of 30 slices shown (2 of 3)]
[im 1/30]
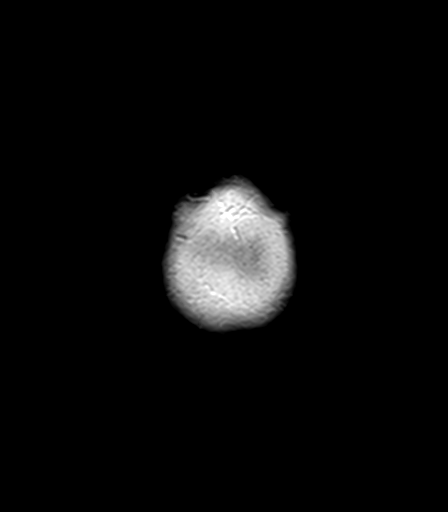
[im 30/30]
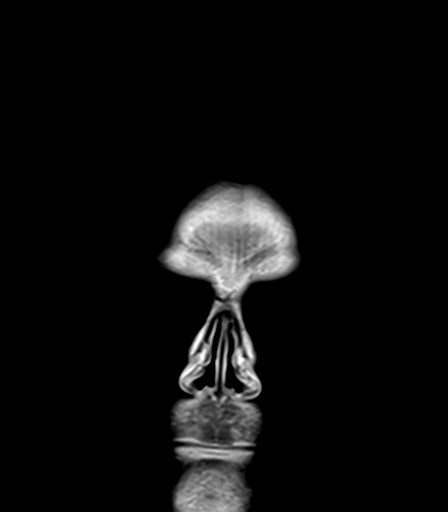

[Series 18: T1 post-contrast · sagittal · 5.0mm · 0.75mm/px · 1 of 24 slices shown (3 of 3)]
[im 1/24]
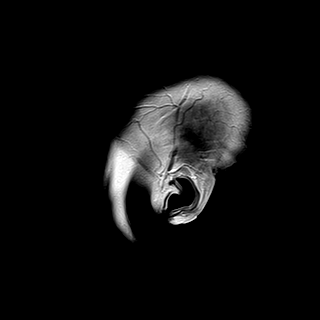

[48 of 48 positions shown; findings below may reference images not displayed]

FINDINGS: Brain: No acute infarction, hemorrhage, hydrocephalus or extra-axial
collection.

Interval development of multiple small enhancing lesions consistent
with metastatic disease (labeled on series 16):

*Left frontal lobe, 3 mm, image 141
*Right frontal, 4 mm image 136
*Posterior right frontal lobe, 2 mm, image 124
*Right parietal lobe, 5 mm, image 125
*Posterior right frontal lobe, 2 mm, image 224
*Anterior right frontal lobe, 4 mm, image 122
*Left frontal lobe, 4 mm, image 120
*Posterior left frontal lobe, 3 mm, image 108
*Anterior left frontal lobe, 5 mm, image 99
*Right frontal operculum, 3 mm, image 95
*Right frontal lobe, 6 mm with necrotic center, image 93
*Left frontal lobe, 3 mm, image 84
*Right cerebellar hemisphere, 4 mm, image 59
*Inferior left temporal lobe, 5 mm, image 50
*Left cerebellar hemisphere, 2 mm, image 43

Scattered foci of T2 hyperintensity are seen within the white matter
of the cerebral hemispheres, nonspecific, most likely related to
chronic small vessel ischemia, unchanged from prior MRI.

Vascular: Normal flow voids.

Skull and upper cervical spine: No focal lesion identified.

Sinuses/Orbits: Mucous retention cyst the bilateral sphenoid
sinuses. The orbits are maintained.

Other: None.
IMPRESSION: Interval development of multiple small enhancing lesions consistent
with metastatic disease to the brain.

These results will be called to the ordering clinician or
representative by the Radiologist Assistant, and communication
documented in the PACS or [REDACTED].

## 2019-09-04 MED ORDER — GADOBUTROL 1 MMOL/ML IV SOLN
6.0000 mL | Freq: Once | INTRAVENOUS | Status: AC | PRN
  Administered 2019-09-04: 6 mL via INTRAVENOUS

## 2019-09-05 ENCOUNTER — Telehealth: Payer: Self-pay

## 2019-09-05 NOTE — Telephone Encounter (Signed)
[facility] called and wanted to ensure the MD was aware of the changes in the MRI today. They were advised that the MD would be notified today of the results. Results sent to Dr. Isidore Moos and Dr.Kinard.

## 2019-09-07 ENCOUNTER — Encounter: Payer: Self-pay | Admitting: Endocrinology

## 2019-09-08 ENCOUNTER — Telehealth: Payer: Self-pay

## 2019-09-08 NOTE — Telephone Encounter (Signed)
Patient called and asked if she should change her appointment due to her MRI results. Patient advised after talking with Dr. Sondra Come To come in on 6/30 at 1230 pm to meet with Dr. Sondra Come and she will go to CT sim at 1pm. Pt. Verbalized understanding.

## 2019-09-10 ENCOUNTER — Ambulatory Visit
Admission: RE | Admit: 2019-09-10 | Discharge: 2019-09-10 | Disposition: A | Discharge status: Home / Self Care | Payer: 59 | Source: Ambulatory Visit | Attending: Radiation Oncology | Admitting: Radiation Oncology

## 2019-09-10 ENCOUNTER — Other Ambulatory Visit: Payer: Self-pay

## 2019-09-10 DIAGNOSIS — C3490 Malignant neoplasm of unspecified part of unspecified bronchus or lung: Secondary | ICD-10-CM

## 2019-09-10 DIAGNOSIS — C7931 Secondary malignant neoplasm of brain: Secondary | ICD-10-CM | POA: Insufficient documentation

## 2019-09-10 DIAGNOSIS — C3492 Malignant neoplasm of unspecified part of left bronchus or lung: Secondary | ICD-10-CM | POA: Insufficient documentation

## 2019-09-10 NOTE — Progress Notes (Signed)
°  Radiation Oncology         [phone] ________________________________  Name: Christy Abbott MRN: [id number]  Date: 09/11/2019  DOB: 07/24/1955  Simulation Verification Note    ICD-10-CM   1. Primary malignant neoplasm of lung with metastasis to brain [facility])  C34.90    C79.31     NARRATIVE: The patient was brought to the treatment unit and placed in the planned treatment position. The clinical setup was verified. Then port films were obtained and uploaded to the radiation oncology medical record software.  The treatment beams were carefully compared against the planned radiation fields. The position location and shape of the radiation fields was reviewed. They targeted volume of tissue appears to be appropriately covered by the radiation beams. Organs at risk appear to be excluded as planned.  Based on my personal review, I approved the simulation verification. The patient's treatment will proceed as planned.  -----------------------------------  Blair Promise, PhD, MD  This document serves as a record of services personally performed by Christy Pray, MD. It was created on his behalf by Clerance Lav, a trained medical scribe. The creation of this record is based on the scribe's personal observations and the provider's statements to them. This document has been checked and approved by the attending provider.

## 2019-09-10 NOTE — Progress Notes (Signed)
  Radiation Oncology         [phone] ________________________________  Name: Christy Abbott MRN: [id number]  Date: 09/10/2019  DOB: [birth year]  SIMULATION AND TREATMENT PLANNING NOTE    ICD-10-CM   1. 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  Identity was confirmed.  All relevant records and images related to the planned course of therapy were reviewed.  The patient freely provided informed written consent to proceed with treatment after reviewing the details related to the planned course of therapy. The consent form was witnessed and verified by the simulation staff.  Then, the patient was set-up in a stable reproducible  supine position for radiation therapy.  CT images were obtained.  Surface markings were placed.  The CT images were loaded into the planning software.  Then the target and avoidance structures were contoured.  Treatment planning then occurred.  The radiation prescription was entered and confirmed.  Then, I designed and supervised the construction of a total of 3 medically necessary complex treatment devices.  I have requested : Isodose Plan.  I have ordered:dose calc.  PLAN:  The patient will receive 35 Gy in 14 fractions directed at the whole brain.  -----------------------------------  Blair Promise, PhD, MD  This document serves as a record of services personally performed by Gery Pray, MD. It was created on his behalf by Clerance Lav, a trained medical scribe. The creation of this record is based on the scribe's personal observations and the provider's statements to them. This document has been checked and approved by the attending provider.

## 2019-09-10 NOTE — Progress Notes (Signed)
Patient here for a f/u visit with Dr. Sondra Come to review her radiation treatment plan and to sign a consent.    Wt Readings from Last 3 Encounters:  09/10/19 145 lb 12.8 oz (66.1 kg)  08/13/19 146 lb (66.2 kg)  08/07/19 143 lb 12.8 oz (65.2 kg)

## 2019-09-10 NOTE — Progress Notes (Signed)
Radiation Oncology         [phone] ________________________________  Name: Christy Abbott MRN: [id number]  Date: 09/10/2019  DOB: Apr 01, 1955  Re-Evaluation Note  CC: [name], NP  Tish Men, MD    ICD-10-CM   1. 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 She was initially seen in consultation on 05/06/2019 for small cell carcinoma of the left lung and was treated with 60 Gy in 30 fractions directed at the left perihilar mass from 05/14/2019 - 06/24/2019 along with radiosensitizing chemotherapy. After completing radiation therapy, she was seen in follow-up on 07/24/2019, during which time she was recovering from the effects of radiation and was to continue with chemotherapy followed by a chest CT scan and additional brain MRI.  Chest CT scan on 08/12/2019 showed a continued decrease in size of the very subtle areas of nodularity following treatment. The dominant area in the left hilar region was without measurable soft tissue as on the previous exam. The left lower lobe pulmonary arterial branch thrombus appeared diminished but was not well evaluated on the venous phase evaluation.  She was last seen by Roosevelt Locks, medical oncology PA-C, on 08/13/2019. Systemic chemotherapy with Cisplatin and Etoposide was reduced at cycle #4 and then discontinued after that cycle secondary to severe pancytopenia, which in the past required blood and platelet transfusions, the most recent of which was on 08/05/2019.  Unfortunately, brain MRI on 09/04/2019 showed interval development of multiple small enhancing lesions that were consistent with metastatic disease to the brain.  On review of systems, the patient reports noticing some mild  headaches over the past few days. She denies nausea or confusion issues and any other symptoms.    Allergies:  is allergic to jardiance [empagliflozin] and trazodone and nefazodone.  Meds: Current Outpatient Medications  Medication Sig Dispense Refill   albuterol (VENTOLIN HFA) 108 (90 Base) MCG/ACT inhaler Inhale 2 puffs into the lungs every 6 (six) hours as needed for wheezing or shortness of breath. (Patient not taking: Reported on 08/13/2019) 6.7 g 0   Bioflavonoid Products (ESTER C PO) Take 1 tablet by mouth daily.     Calcium Carb-Cholecalciferol (CALTRATE 600+D3 PO) Take 1 tablet by mouth daily.     carvedilol (COREG) 12.5 MG tablet Take 1 tablet (12.5 mg total) by mouth in the morning and at bedtime. 180 tablet 3   Continuous Blood Gluc Sensor (FREESTYLE LIBRE 2 SENSOR) MISC 2 Devices by Does not apply route every 14 (fourteen) days. 2 each 3   diphenhydramine-acetaminophen (TYLENOL PM) 25-500 MG TABS tablet Take 2 tablets by mouth at bedtime.     flecainide (TAMBOCOR) 50 MG tablet TAKE 1 TABLET BY MOUTH TWICE DAILY, MAY TAKE AN ADDITIONAL 1 TABLET AS NEEDED. 270 tablet 2   furosemide (LASIX) 20 MG tablet Take 1 tablet (20 mg total) by mouth daily as needed. (Patient taking differently: Take 20 mg by mouth daily as needed. fluid) 60 tablet 0   insulin aspart (NOVOLOG) 100 UNIT/ML FlexPen Inject subcutaneously 3 times daily prior to meals per sliding scale 15 mL 1   insulin glargine (LANTUS) 100 unit/mL SOPN Inject 0.1 mLs (10 Units total) into the skin daily. (Patient taking differently: Inject 10 Units into the skin at bedtime. )  15 mL 2   Insulin Pen Needle (B-D UF III MINI PEN NEEDLES) 31G X 5 MM MISC Use with insulin pen 100 each 3   Lancets 30G MISC Check sugar twice daily. 100 each 3   lidocaine-prilocaine (EMLA) cream Apply 1 application topically as needed. Using a cotton ball , apply a small amount of cream to the skin over the porta cath site. Do not rub in the cream.  Cover with Plastic wrap. 30 g 0   lisinopril-hydrochlorothiazide (PRINZIDE,ZESTORETIC) 20-25 MG tablet Take 1 tablet by mouth daily. 90 tablet 1   LORazepam (ATIVAN) 0.5 MG tablet Place 1 tablet (0.5 mg total) under the tongue every 8 (eight) hours as needed (nausea). 30 tablet 0   magnesium oxide (MAG-OX) 400 (241.3 Mg) MG tablet Take 1 tablet (400 mg total) by mouth 3 (three) times daily. 90 tablet 0   metFORMIN (GLUCOPHAGE) 500 MG tablet TAKE 2 TABLETS BY MOUTH TWICE DAILY ( APPOINTMENT REQUIRED FOR FUTURE REFILLS) 120 tablet 0   methimazole (TAPAZOLE) 10 MG tablet Take 1 tablet (10 mg total) by mouth daily. 90 tablet 0   Omega-3 Fatty Acids (FISH OIL) 1200 MG CAPS Take 1,200 mg by mouth daily.      ondansetron (ZOFRAN) 4 MG tablet Take 1 tablet (4 mg total) by mouth every 8 (eight) hours as needed for nausea or vomiting. 20 tablet 0   potassium chloride SA (KLOR-CON) 20 MEQ tablet TAKE 1  BY MOUTH ONCE DAILY 90 tablet 0   prochlorperazine (COMPAZINE) 10 MG tablet Take 1 tablet (10 mg total) by mouth every 6 (six) hours as needed for nausea or vomiting. 30 tablet 0   Rivaroxaban Stater Pack, 15 mg and 20 mg, (XARELTO STARTER PACK) Follow package directions: Take one 15mg  tablet by mouth twice a day. On day 22, switch to one 20mg  tablet once a day. Take with food. 51 each 0   simvastatin (ZOCOR) 10 MG tablet TAKE 1 TABLET BY MOUTH AT BEDTIME 90 tablet 1   Tetrahydrozoline HCl (VISINE OP) Place 1 drop into both eyes daily as needed (irritation).     No current facility-administered medications for this encounter.    Physical Findings: The patient is in no acute distress. Patient is alert and oriented.  height is 5\' 4"  (1.626 m) and weight is 145 lb 12.8 oz (66.1 kg). Her temperature is 97.2 F (36.2 C) (abnormal). Her blood pressure is 147/92 (abnormal) and her pulse is 75. Her respiration is 18 and oxygen saturation is 99%.  No significant changes. Lungs are clear to auscultation  bilaterally. Heart has regular rate and rhythm. No palpable cervical, supraclavicular, or axillary adenopathy. Abdomen soft, non-tender, normal bowel sounds.  No focal neuro deficits noted  Lab Findings: Lab Results  Component Value Date   WBC 8.6 08/13/2019   HGB 8.7 (L) 08/13/2019   HCT 26.9 (L) 08/13/2019   MCV 99.3 08/13/2019   PLT 105 (L) 08/13/2019    Radiographic Findings: CT Chest W Contrast  Result Date: 08/12/2019 CLINICAL DATA:  Small cell lung cancer limited stage, additional workup initially diagnosed in early 2021, post chemo and radiotherapy by report. EXAM: CT CHEST WITH CONTRAST TECHNIQUE: Multidetector CT imaging of the chest was performed during intravenous contrast administration. CONTRAST:  62mL OMNIPAQUE IOHEXOL 300 MG/ML  SOLN COMPARISON:  06/24/2019 FINDINGS: Cardiovascular: Calcified atheromatous plaque throughout the thoracic aorta. No aneurysmal dilation. Heart size is normal with signs of calcified coronary artery disease as before. Decreased clot in LEFT pulmonary  artery grossly as compared to the prior study. Limited assessment on venous phase imaging still with some evidence of web-like and eccentric changes in the LEFT lower lobe branches. Mediastinum/Nodes: Thoracic inlet structures are normal. RIGHT IJ central venous catheter terminates in the upper RIGHT atrium. No mediastinal or hilar lymphadenopathy. Lungs/Pleura: Pulmonary emphysema. No dense consolidation. No pleural effusion. Airways are patent. Minimal nonmeasurable soft tissue in the region of the LEFT hilum. Small nodule in the central LEFT upper lobe seen on previous imaging has resolved. No new pulmonary nodules. LEFT lower lobe nodularity continues to diminish. (Image 100, series 7) 5 mm but with less density than on the prior imaging study. (Image 57, series 7) 6 mm RIGHT lower lobe, superior segment juxtapleural nodule similar to the prior study. Upper Abdomen: Adrenal glands are normal. Post  cholecystectomy, partially imaged. Imaged portions of the pancreas and spleen are unremarkable. No acute upper abdominal process. Musculoskeletal: No acute bone finding or destructive bone process. IMPRESSION: 1. Diminished thrombus in LEFT lower lobe pulmonary arterial branches suggested on the current study, not well evaluated on venous phase evaluation. 2. Continued decrease in size of very subtle areas of nodularity following treatment. Dominant area in the LEFT hilar region without measurable soft tissue as on the previous exam. 3. Emphysema and aortic atherosclerosis. Aortic Atherosclerosis (ICD10-I70.0) and Emphysema (ICD10-J43.9). Electronically Signed   By: Zetta Bills M.D.   On: 08/12/2019 12:50   MR Brain W Wo Contrast  Result Date: 09/05/2019 CLINICAL DATA:  Small-cell lung cancer.  Assess treatment response. EXAM: MRI HEAD WITHOUT AND WITH CONTRAST TECHNIQUE: Multiplanar, multiecho pulse sequences of the brain and surrounding structures were obtained without and with intravenous contrast. CONTRAST:  23mL GADAVIST GADOBUTROL 1 MMOL/ML IV SOLN COMPARISON:  MRI of the brain May 12, 2019 FINDINGS: Brain: No acute infarction, hemorrhage, hydrocephalus or extra-axial collection. Interval development of multiple small enhancing lesions consistent with metastatic disease (labeled on series 16): *Left frontal lobe, 3 mm, image 141 *Right frontal, 4 mm image 136 *Posterior right frontal lobe, 2 mm, image 124 *Right parietal lobe, 5 mm, image 125 *Posterior right frontal lobe, 2 mm, image 224 *Anterior right frontal lobe, 4 mm, image 122 *Left frontal lobe, 4 mm, image 120 *Posterior left frontal lobe, 3 mm, image 108 *Anterior left frontal lobe, 5 mm, image 99 *Right frontal operculum, 3 mm, image 95 *Right frontal lobe, 6 mm with necrotic center, image 93 *Left frontal lobe, 3 mm, image 84 *Right cerebellar hemisphere, 4 mm, image 59 *Inferior left temporal lobe, 5 mm, image 50 *Left cerebellar  hemisphere, 2 mm, image 43 Scattered foci of T2 hyperintensity are seen within the white matter of the cerebral hemispheres, nonspecific, most likely related to chronic small vessel ischemia, unchanged from prior MRI. Vascular: Normal flow voids. Skull and upper cervical spine: No focal lesion identified. Sinuses/Orbits: Mucous retention cyst the bilateral sphenoid sinuses. The orbits are maintained. Other: None. IMPRESSION: Interval development of multiple small enhancing lesions consistent with metastatic disease to the brain. These results will be called to the ordering clinician or representative by the Radiologist Assistant, and communication documented in the PACS or [employer]. Electronically Signed   By: Pedro Earls M.D.   On: 09/05/2019 14:36    Impression: Stage IIB (cT1cN1M0) limited stage small cell carcinoma of the left lung now with brain metastases  Patient will be a good candidate for radiation therapy directed at the brain.  Given the histology as well as the small  size of the lesions (largest 6 mm) I would expect a good outcome with whole brain radiation therapy.  I discussed the general course of treatment, side effects and potential toxicities of whole brain radiation therapy with the patient and her husband as well as daughter by phone.  The patient appears to understand and wishes to proceed with planned course of treatment.  Plan:  Patient is scheduled for CT simulation later today.  Treatments to start tomorrow.  Anticipate 14 treatments directed to the whole brain.  We will try to avoid steroid use given the patient's history of diabetes mellitus and prior history of diabetic ketoacidosis.  -----------------------------------  Blair Promise, PhD, MD  This document serves as a record of services personally performed by Gery Pray, MD. It was created on his behalf by Clerance Lav, a trained medical scribe. The creation of this record is based on the  scribe's personal observations and the provider's statements to them. This document has been checked and approved by the attending provider.

## 2019-09-11 ENCOUNTER — Other Ambulatory Visit: Payer: Self-pay

## 2019-09-11 ENCOUNTER — Ambulatory Visit: Payer: 59 | Admitting: Radiation Oncology

## 2019-09-11 ENCOUNTER — Ambulatory Visit
Admission: RE | Admit: 2019-09-11 | Discharge: 2019-09-11 | Disposition: A | Discharge status: Home / Self Care | Payer: 59 | Source: Ambulatory Visit | Attending: Radiation Oncology | Admitting: Radiation Oncology

## 2019-09-11 DIAGNOSIS — C3492 Malignant neoplasm of unspecified part of left bronchus or lung: Secondary | ICD-10-CM | POA: Insufficient documentation

## 2019-09-11 DIAGNOSIS — C7931 Secondary malignant neoplasm of brain: Secondary | ICD-10-CM | POA: Insufficient documentation

## 2019-09-11 DIAGNOSIS — Z452 Encounter for adjustment and management of vascular access device: Secondary | ICD-10-CM | POA: Diagnosis not present

## 2019-09-11 DIAGNOSIS — C3490 Malignant neoplasm of unspecified part of unspecified bronchus or lung: Secondary | ICD-10-CM

## 2019-09-12 ENCOUNTER — Ambulatory Visit
Admission: RE | Admit: 2019-09-12 | Discharge: 2019-09-12 | Disposition: A | Discharge status: Home / Self Care | Payer: 59 | Source: Ambulatory Visit | Attending: Radiation Oncology | Admitting: Radiation Oncology

## 2019-09-12 DIAGNOSIS — Z452 Encounter for adjustment and management of vascular access device: Secondary | ICD-10-CM | POA: Diagnosis not present

## 2019-09-16 ENCOUNTER — Ambulatory Visit
Admission: RE | Admit: 2019-09-16 | Discharge: 2019-09-16 | Disposition: A | Discharge status: Home / Self Care | Payer: 59 | Source: Ambulatory Visit | Attending: Radiation Oncology | Admitting: Radiation Oncology

## 2019-09-16 ENCOUNTER — Other Ambulatory Visit (INDEPENDENT_AMBULATORY_CARE_PROVIDER_SITE_OTHER): Payer: 59

## 2019-09-16 ENCOUNTER — Other Ambulatory Visit: Payer: Self-pay

## 2019-09-16 DIAGNOSIS — Z794 Long term (current) use of insulin: Secondary | ICD-10-CM | POA: Diagnosis not present

## 2019-09-16 DIAGNOSIS — E0590 Thyrotoxicosis, unspecified without thyrotoxic crisis or storm: Secondary | ICD-10-CM

## 2019-09-16 DIAGNOSIS — E1165 Type 2 diabetes mellitus with hyperglycemia: Secondary | ICD-10-CM

## 2019-09-16 DIAGNOSIS — Z452 Encounter for adjustment and management of vascular access device: Secondary | ICD-10-CM | POA: Diagnosis not present

## 2019-09-16 LAB — TSH: TSH: 1.1 u[IU]/mL (ref 0.35–4.50)

## 2019-09-16 LAB — BASIC METABOLIC PANEL
BUN: 31 mg/dL — ABNORMAL HIGH (ref 6–23)
CO2: 30 mEq/L (ref 19–32)
Calcium: 9.7 mg/dL (ref 8.4–10.5)
Chloride: 99 mEq/L (ref 96–112)
Creatinine, Ser: 1.11 mg/dL (ref 0.40–1.20)
GFR: 49.46 mL/min — ABNORMAL LOW (ref >60.00)
Glucose, Bld: 165 mg/dL — ABNORMAL HIGH (ref 70–99)
Potassium: 4.1 mEq/L (ref 3.5–5.1)
Sodium: 138 mEq/L (ref 135–145)

## 2019-09-16 LAB — HEMOGLOBIN A1C: Hgb A1c MFr Bld: 6.4 % (ref 4.6–6.5)

## 2019-09-16 LAB — T4, FREE: Free T4: 0.89 ng/dL (ref 0.60–1.60)

## 2019-09-17 ENCOUNTER — Ambulatory Visit
Admission: RE | Admit: 2019-09-17 | Discharge: 2019-09-17 | Disposition: A | Discharge status: Home / Self Care | Payer: 59 | Source: Ambulatory Visit | Attending: Radiation Oncology | Admitting: Radiation Oncology

## 2019-09-17 ENCOUNTER — Other Ambulatory Visit: Payer: Self-pay

## 2019-09-17 DIAGNOSIS — Z452 Encounter for adjustment and management of vascular access device: Secondary | ICD-10-CM | POA: Diagnosis not present

## 2019-09-18 ENCOUNTER — Other Ambulatory Visit: Payer: Self-pay

## 2019-09-18 ENCOUNTER — Ambulatory Visit
Admission: RE | Admit: 2019-09-18 | Discharge: 2019-09-18 | Disposition: A | Discharge status: Home / Self Care | Payer: 59 | Source: Ambulatory Visit | Attending: Radiation Oncology | Admitting: Radiation Oncology

## 2019-09-18 DIAGNOSIS — Z452 Encounter for adjustment and management of vascular access device: Secondary | ICD-10-CM | POA: Diagnosis not present

## 2019-09-19 ENCOUNTER — Ambulatory Visit (INDEPENDENT_AMBULATORY_CARE_PROVIDER_SITE_OTHER): Payer: 59 | Admitting: Endocrinology

## 2019-09-19 ENCOUNTER — Other Ambulatory Visit: Payer: Self-pay

## 2019-09-19 ENCOUNTER — Encounter: Payer: Self-pay | Admitting: Endocrinology

## 2019-09-19 ENCOUNTER — Ambulatory Visit
Admission: RE | Admit: 2019-09-19 | Discharge: 2019-09-19 | Disposition: A | Discharge status: Home / Self Care | Payer: 59 | Source: Ambulatory Visit | Attending: Radiation Oncology | Admitting: Radiation Oncology

## 2019-09-19 VITALS — BP 130/78 | HR 84 | Ht 64.0 in | Wt 143.6 lb

## 2019-09-19 DIAGNOSIS — I10 Essential (primary) hypertension: Secondary | ICD-10-CM | POA: Diagnosis not present

## 2019-09-19 DIAGNOSIS — Z452 Encounter for adjustment and management of vascular access device: Secondary | ICD-10-CM | POA: Diagnosis not present

## 2019-09-19 DIAGNOSIS — Z794 Long term (current) use of insulin: Secondary | ICD-10-CM | POA: Diagnosis not present

## 2019-09-19 DIAGNOSIS — E0590 Thyrotoxicosis, unspecified without thyrotoxic crisis or storm: Secondary | ICD-10-CM

## 2019-09-19 DIAGNOSIS — E1165 Type 2 diabetes mellitus with hyperglycemia: Secondary | ICD-10-CM | POA: Diagnosis not present

## 2019-09-19 NOTE — Patient Instructions (Addendum)
8 Units Novolog at supper before eatind

## 2019-09-19 NOTE — Progress Notes (Signed)
[name], female   DOB: February 06, 1956, 64 y.o.   MRN: [id number]                                                                                                               Reason for Appointment:  Hyperthyroidism and diabetes, follow-up  Referring healthcare provider: [name], NP   Chief complaint: Follow-up   History of Present Illness:   HYPOTHYROIDISM: It is unclear what symptoms the patient had at initial diagnosis but she had a low TSH in 2014 She was referred for endocrinology evaluation but she did not do so because of the cost  Subsequently in 02/2018 she was started on methimazole, at that time she was apparently still not having any symptoms  including palpitations, shakiness, feeling excessively warm and sweaty, weight loss, and fatigue.  Baseline free T4 and T3 levels were normal but her thyrotropin receptor antibody was high She does not think she felt any different with starting methimazole  She was finally referred for endocrinology consultation and seen in 4/21 She has been prescribed methimazole 15 mg daily since 1/21 when her free T4 was mildly increased She did not feel any different with starting the treatment again  However she has had difficulties with fatigue and weight loss as well as some cold intolerance with going through chemotherapy and radiation  She has now been taking her methimazole 10mg  more regularly instead of 3 to 4 days a week  Thyroid levels are in the normal range and little changed  Note thyrotropin receptor antibody was high in April  Wt Readings from Last 3 Encounters:  09/19/19 143 lb 9.6 oz (65.1 kg)  09/10/19 145 lb 12.8 oz (66.1 kg)  08/13/19 146 lb (66.2 kg)    Thyroid function tests as follows:     Lab Results  Component Value Date   FREET4 0.89 09/16/2019   FREET4 0.90 08/04/2019   FREET4 0.85 06/23/2019   T3FREE 2.9 06/23/2019   T3FREE 1.6 (L) 03/22/2019   T3FREE 3.5 02/25/2018   TSH 1.10  09/16/2019   TSH 1.51 08/04/2019   TSH 1.19 06/23/2019     Lab Results  Component Value Date   THYROTRECAB 2.72 (H) 06/23/2019   THYROTRECAB 41.3 (H) 03/08/2018   DIABETES management:  Date of diagnosis of type 2 diabetes mellitus:   2015?      Background history:  She has been on Metformin since diagnosis when her diabetes was relatively mild Subsequently Amaryl added After 2018 her blood sugars appear to be progressively worsening with increasing A1c She thinks that sometimes she would forget to take her medications in the morning and blood sugars would be higher for this reason She was admitted for severe hypoglycemia with her sepsis and recommended to be on insulin in 03/2019  Recent history:   Most recent A1c is 10.4 done on 06/23/2019, previously 13.9  INSULIN regimen is: Lantus 15 units at night, NovoLog 6   Non-insulin hypoglycemic drugs the patient is taking are: Metformin 2 g  twice daily  Current management, blood sugar patterns and problems identified:  She was told to increase her Lantus progressively and also take NovoLog 4 to 6 units before meals  However she did not look at her instructions on her last visit and is still following the previous insulin regimen  Also did not bring her blood sugar monitor or any logbook today   She is now not getting any chemotherapy and was getting Decadron previously  She thinks her blood sugars are about 180 fasting  Postprandial readings are reportedly about 220 and her lab glucose was 227 fasting this week  She was told to look into the freestyle libre, Prescription was sent to the pharmacy but apparently she did not pick this up  She continues to take Metformin, Amaryl was stopped        Side effects from medications have been: None  Typical meal intake: Breakfast is   eggs and toast               Exercise:  None  Glucose monitoring:  As below    Blood Glucose readings in the last few days being checked by  [employer] meter   CONTINUOUS GLUCOSE MONITORING RECORD INTERPRETATION    Dates of Recording: 6/14 through 09/07/2019  Sensor description: Elenor Legato version 2  Results statistics:   CGM use % of time  81  Average and SD  169, GV 36  Time in range    59    %  % Time Above 180  26  % Time above 250  13  % Time Below target  2    PRE-MEAL Fasting Lunch Dinner Bedtime Overall  Glucose range:       Mean/median:  116  157  179   169   POST-MEAL PC Breakfast PC Lunch PC Dinner  Glucose range:     Mean/median:  128  188  228    Glycemic patterns summary: HIGHEST blood sugars are late at night between about 12-2 AM and starting to rise after 10 PM. Blood sugars are near normal early morning but variably high between 2-6 PM  Hyperglycemic episodes are occurring very consistently late at night after 10 PM as above and also variably between 2 PM-6 PM with readings frequently over 200 in the afternoon  Hypoglycemic episodes have been minimal with only occasional low normal transient readings overnight  Overnight periods: Blood sugars are the highest over 200 after 11 PM and progressively declining after about 2-3 AM to near normal levels without usual hypoglycemia  Preprandial periods: Blood sugars are near normal at breakfast, moderately high but variable at lunchtime and usually averaging nearly 180 at dinnertime  Postprandial periods:   After breakfast: Usually blood sugars not going up much    After lunch:   She has variable postprandial spikes after lunch and not consistently After dinner: Blood sugars are usually going up significantly and consistently after 9-10 PM well over 200     Dietician visit, most recent: Never  Weight history: Wt Readings from Last 3 Encounters:  09/19/19 143 lb 9.6 oz (65.1 kg)  09/10/19 145 lb 12.8 oz (66.1 kg)  08/13/19 146 lb (66.2 kg)   Lab Results  Component Value Date   HGBA1C 6.4 09/16/2019   HGBA1C 10.4 (A) 06/23/2019   HGBA1C 13.9 (H)  03/23/2019   Lab Results  Component Value Date   MICROALBUR <0.7 05/10/2015   LDLCALC 21 07/10/2019   CREATININE 1.11 09/16/2019  Allergies as of 09/19/2019      Reactions   Jardiance [empagliflozin] Palpitations   Causes heart palpitations   Trazodone And Nefazodone Other (See Comments)   "hyped me up, could not sleep"      Medication List       Accurate as of September 19, 2019 11:59 PM. If you have any questions, ask your nurse or doctor.        albuterol 108 (90 Base) MCG/ACT inhaler Commonly known as: VENTOLIN HFA Inhale 2 puffs into the lungs every 6 (six) hours as needed for wheezing or shortness of breath.   B-D UF III MINI PEN NEEDLES 31G X 5 MM Misc Generic drug: Insulin Pen Needle Use with insulin pen   CALTRATE 600+D3 PO Take 1 tablet by mouth daily.   carvedilol 12.5 MG tablet Commonly known as: COREG Take 1 tablet (12.5 mg total) by mouth in the morning and at bedtime.   diphenhydramine-acetaminophen 25-500 MG Tabs tablet Commonly known as: TYLENOL PM Take 2 tablets by mouth at bedtime.   ESTER C PO Take 1 tablet by mouth daily.   Fish Oil 1200 MG Caps Take 1,200 mg by mouth daily.   flecainide 50 MG tablet Commonly known as: TAMBOCOR TAKE 1 TABLET BY MOUTH TWICE DAILY, MAY TAKE AN ADDITIONAL 1 TABLET AS NEEDED.   FreeStyle Libre 2 Sensor Misc 2 Devices by Does not apply route every 14 (fourteen) days.   furosemide 20 MG tablet Commonly known as: LASIX Take 1 tablet (20 mg total) by mouth daily as needed. What changed: additional instructions   insulin aspart 100 UNIT/ML FlexPen Commonly known as: NOVOLOG Inject subcutaneously 3 times daily prior to meals per sliding scale   insulin glargine 100 unit/mL Sopn Commonly known as: LANTUS Inject 0.1 mLs (10 Units total) into the skin daily. What changed:   how much to take  when to take this   Lancets 30G Misc Check sugar twice daily.   lidocaine-prilocaine cream Commonly known as:  EMLA Apply 1 application topically as needed. Using a cotton ball , apply a small amount of cream to the skin over the porta cath site. Do not rub in the cream. Cover with Plastic wrap.   lisinopril-hydrochlorothiazide 20-25 MG tablet Commonly known as: ZESTORETIC Take 1 tablet by mouth daily.   LORazepam 0.5 MG tablet Commonly known as: Ativan Place 1 tablet (0.5 mg total) under the tongue every 8 (eight) hours as needed (nausea).   magnesium oxide 400 (241.3 Mg) MG tablet Commonly known as: MAG-OX Take 1 tablet (400 mg total) by mouth 3 (three) times daily.   metFORMIN 500 MG tablet Commonly known as: GLUCOPHAGE TAKE 2 TABLETS BY MOUTH TWICE DAILY ( APPOINTMENT REQUIRED FOR FUTURE REFILLS)   methimazole 10 MG tablet Commonly known as: TAPAZOLE Take 1 tablet (10 mg total) by mouth daily.   ondansetron 4 MG tablet Commonly known as: Zofran Take 1 tablet (4 mg total) by mouth every 8 (eight) hours as needed for nausea or vomiting.   potassium chloride SA 20 MEQ tablet Commonly known as: KLOR-CON TAKE 1  BY MOUTH ONCE DAILY   prochlorperazine 10 MG tablet Commonly known as: COMPAZINE Take 1 tablet (10 mg total) by mouth every 6 (six) hours as needed for nausea or vomiting.   simvastatin 10 MG tablet Commonly known as: ZOCOR TAKE 1 TABLET BY MOUTH AT BEDTIME   VISINE OP Place 1 drop into both eyes daily as needed (irritation).   Xarelto Starter Pack  Generic drug: Rivaroxaban Stater Pack (15 mg and 20 mg) Follow package directions: Take one 15mg  tablet by mouth twice a day. On day 22, switch to one 20mg  tablet once a day. Take with food.           Past Medical History:  Diagnosis Date  . Arthritis   . Atrial flutter [facility])    s/p CTI by Dr Lovena Le 02/2013  . Chest pain   . Diabetes mellitus without complication ([place])   . Dysrhythmia    hx of Aflutter  . 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 Hemochromatosis associated with compound heterozygous  mutation in HFE gene ([place]) 12/10/2017  . Hyperlipidemia   . Hypertension   . Hyperthyroidism 10/31/2014    Past Surgical History:  Procedure Laterality Date  . ABLATION  03-04-2013   CTI by Dr Lovena Le for atrial flutter  . ATRIAL FLUTTER ABLATION N/A 03/04/2013   Procedure: ATRIAL FLUTTER ABLATION;  Surgeon: Evans Lance, MD;  Location: [facility] CATH LAB;  Service: Cardiovascular;  Laterality: N/A;  . CHOLECYSTECTOMY  1982  . COLONOSCOPY W/ POLYPECTOMY     x 2  . ESOPHAGOGASTRODUODENOSCOPY N/A 05/20/2014   Procedure: ESOPHAGOGASTRODUODENOSCOPY (EGD);  Surgeon: Teena Irani, MD;  Location: [facility] ENDOSCOPY;  Service: Endoscopy;  Laterality: N/A;  . IR IMAGING GUIDED PORT INSERTION  05/09/2019  . TONSILLECTOMY  1972 ?  Marland Kitchen VIDEO BRONCHOSCOPY WITH ENDOBRONCHIAL ULTRASOUND Left 04/30/2019   Procedure: VIDEO BRONCHOSCOPY WITH ENDOBRONCHIAL ULTRASOUND;  Surgeon: Garner Nash, DO;  Location: [place];  Service: Pulmonary;  Laterality: Left;    Family History  Problem Relation Age of Onset  . Atrial fibrillation Mother   . Arrhythmia Mother   . Diabetes Mother   . Thyroid disease Mother   . Hodgkin's lymphoma Father   . Thyroid disease Daughter        Graves disease  . Diabetes Daughter   . Anxiety disorder Neg Hx     Social History:  reports that she quit smoking about 6 months ago. Her smoking use included cigarettes. She smoked 1.00 pack per day. She has never used smokeless tobacco. She reports that she does not drink alcohol and does not use drugs.  Allergies:  Allergies  Allergen Reactions  . Jardiance [Empagliflozin] Palpitations    Causes heart palpitations  . Trazodone And Nefazodone Other (See Comments)    "hyped me up, could not sleep"     Review of Systems  Hypertension has been treated with Zestoretic   Currently on 10 mg simvastatin for hyperlipidemia  Lab Results  Component Value Date   CHOL 80 (L) 07/10/2019   CHOL 149 02/21/2018   CHOL 159 09/04/2016   Lab Results    Component Value Date   HDL 25 (L) 07/10/2019   HDL 40 02/21/2018   HDL 44.10 09/04/2016   Lab Results  Component Value Date   LDLCALC 21 07/10/2019   LDLCALC 46 02/21/2018   LDLCALC 77 09/04/2016   Lab Results  Component Value Date   TRIG 222 (H) 07/10/2019   TRIG 315 (H) 02/21/2018   TRIG 191.0 (H) 09/04/2016   Lab Results  Component Value Date   CHOLHDL 3.7 02/21/2018   CHOLHDL 4 09/04/2016   CHOLHDL 4 05/10/2015   No results found for: LDLDIRECT    Examination:   BP 130/78 (BP Location: Left Arm, Patient Position: Sitting, Cuff Size: Normal)   Pulse 84   Ht 5\' 4"  (1.626 m)   Wt  143 lb 9.6 oz (65.1 kg)   SpO2 97%   BMI 24.65 kg/m       Assessment/Plan:   Hyperthyroidism, from Graves' disease   She has had longstanding subclinical hyperthyroidism for many years presenting mostly as a low TSH  She has been on treatment with 10 mg methimazole recently Symptomatically doing well  With taking this regularly she is now consistently euthyroid and may be tending to needed somewhat lower doses with better compliance with her medication Although she will likely need I-131 treatment since her thyrotropin receptor antibody is near normal about 3 months ago may possibly still achieve remission She is still wants to wait on I-131 till at least after her radiation is done   She will take her methimazole 10 mg daily consistently as before and discuss future management on the next visit with repeat thyroid levels and thyrotropin receptor antibodies   DIABETES type II on insulin:  See history of present illness for detailed discussion of current diabetes management, blood sugar patterns and problems identified  She is on basal bolus insulin and Metformin  Her A1c has come down to 6.4 compared to 10.4  Blood sugars were evaluated from CGM analysis and discussed above, has not used it in the last 10 days As above she is taking in adequate insulin at dinnertime and  sometimes missing the doses also With this her blood sugars are averaging 250 after dinner at night Basal insulin appears to be adequate without getting hypoglycemia  DIABETES RECOMMENDATIONS:  She will start using the freestyle libre again and use it regularly, discussed ways to make the sensor stick better Check blood sugars after meals consistently Make sure he takes an average of 8 units at dinnertime and may need more for larger carbohydrate meals of NovoLog To avoid insulin if she is more than 20 minutes late for the meal No change in Lantus unless morning sugars come down excessively Follow-up in 2 months  Needs follow-up microalbumin  Patient Instructions  8 Units Novolog at supper before eatind    Elayne Snare 09/21/2019, 2:46 PM    Note: This office note was prepared with Dragon voice recognition system technology. Any transcriptional errors that result from this process are unintentional.

## 2019-09-22 ENCOUNTER — Ambulatory Visit
Admission: RE | Admit: 2019-09-22 | Discharge: 2019-09-22 | Disposition: A | Discharge status: Home / Self Care | Payer: 59 | Source: Ambulatory Visit | Attending: Radiation Oncology | Admitting: Radiation Oncology

## 2019-09-22 DIAGNOSIS — Z452 Encounter for adjustment and management of vascular access device: Secondary | ICD-10-CM | POA: Diagnosis not present

## 2019-09-23 ENCOUNTER — Other Ambulatory Visit: Payer: Self-pay

## 2019-09-23 ENCOUNTER — Ambulatory Visit
Admission: RE | Admit: 2019-09-23 | Discharge: 2019-09-23 | Disposition: A | Discharge status: Home / Self Care | Payer: 59 | Source: Ambulatory Visit | Attending: Radiation Oncology | Admitting: Radiation Oncology

## 2019-09-23 DIAGNOSIS — Z452 Encounter for adjustment and management of vascular access device: Secondary | ICD-10-CM | POA: Diagnosis not present

## 2019-09-24 ENCOUNTER — Other Ambulatory Visit: Payer: Self-pay

## 2019-09-24 ENCOUNTER — Inpatient Hospital Stay: Payer: 59 | Attending: Physician Assistant

## 2019-09-24 ENCOUNTER — Ambulatory Visit
Admission: RE | Admit: 2019-09-24 | Discharge: 2019-09-24 | Disposition: A | Discharge status: Home / Self Care | Payer: 59 | Source: Ambulatory Visit | Attending: Radiation Oncology | Admitting: Radiation Oncology

## 2019-09-24 VITALS — BP 147/84 | HR 79 | Temp 98.6°F | Resp 18

## 2019-09-24 DIAGNOSIS — D701 Agranulocytosis secondary to cancer chemotherapy: Secondary | ICD-10-CM

## 2019-09-24 DIAGNOSIS — Z452 Encounter for adjustment and management of vascular access device: Secondary | ICD-10-CM | POA: Diagnosis not present

## 2019-09-24 DIAGNOSIS — C3492 Malignant neoplasm of unspecified part of left bronchus or lung: Secondary | ICD-10-CM | POA: Insufficient documentation

## 2019-09-24 MED ORDER — HEPARIN SOD (PORK) LOCK FLUSH 100 UNIT/ML IV SOLN
500.0000 [IU] | Freq: Once | INTRAVENOUS | Status: AC
  Administered 2019-09-24: 500 [IU]
  Filled 2019-09-24: qty 5

## 2019-09-24 MED ORDER — SODIUM CHLORIDE 0.9% FLUSH
10.0000 mL | Freq: Once | INTRAVENOUS | Status: AC
  Administered 2019-09-24: 10 mL
  Filled 2019-09-24: qty 10

## 2019-09-25 ENCOUNTER — Other Ambulatory Visit: Payer: Self-pay

## 2019-09-25 ENCOUNTER — Ambulatory Visit
Admission: RE | Admit: 2019-09-25 | Discharge: 2019-09-25 | Disposition: A | Discharge status: Home / Self Care | Payer: 59 | Source: Ambulatory Visit | Attending: Radiation Oncology | Admitting: Radiation Oncology

## 2019-09-25 DIAGNOSIS — Z452 Encounter for adjustment and management of vascular access device: Secondary | ICD-10-CM | POA: Diagnosis not present

## 2019-09-26 ENCOUNTER — Ambulatory Visit
Admission: RE | Admit: 2019-09-26 | Discharge: 2019-09-26 | Disposition: A | Discharge status: Home / Self Care | Payer: 59 | Source: Ambulatory Visit | Attending: Radiation Oncology | Admitting: Radiation Oncology

## 2019-09-26 ENCOUNTER — Other Ambulatory Visit: Payer: Self-pay

## 2019-09-26 DIAGNOSIS — Z452 Encounter for adjustment and management of vascular access device: Secondary | ICD-10-CM | POA: Diagnosis not present

## 2019-09-29 ENCOUNTER — Other Ambulatory Visit: Payer: Self-pay

## 2019-09-29 ENCOUNTER — Ambulatory Visit
Admission: RE | Admit: 2019-09-29 | Discharge: 2019-09-29 | Disposition: A | Discharge status: Home / Self Care | Payer: 59 | Source: Ambulatory Visit | Attending: Radiation Oncology | Admitting: Radiation Oncology

## 2019-09-29 DIAGNOSIS — Z452 Encounter for adjustment and management of vascular access device: Secondary | ICD-10-CM | POA: Diagnosis not present

## 2019-09-30 ENCOUNTER — Ambulatory Visit
Admission: RE | Admit: 2019-09-30 | Discharge: 2019-09-30 | Disposition: A | Discharge status: Home / Self Care | Payer: 59 | Source: Ambulatory Visit | Attending: Radiation Oncology | Admitting: Radiation Oncology

## 2019-09-30 DIAGNOSIS — Z452 Encounter for adjustment and management of vascular access device: Secondary | ICD-10-CM | POA: Diagnosis not present

## 2019-10-01 ENCOUNTER — Other Ambulatory Visit: Payer: Self-pay | Admitting: Radiation Therapy

## 2019-10-01 ENCOUNTER — Ambulatory Visit
Admission: RE | Admit: 2019-10-01 | Discharge: 2019-10-01 | Disposition: A | Discharge status: Home / Self Care | Payer: 59 | Source: Ambulatory Visit | Attending: Radiation Oncology | Admitting: Radiation Oncology

## 2019-10-01 ENCOUNTER — Encounter: Payer: Self-pay | Admitting: Radiation Oncology

## 2019-10-01 DIAGNOSIS — Z452 Encounter for adjustment and management of vascular access device: Secondary | ICD-10-CM | POA: Diagnosis not present

## 2019-10-07 ENCOUNTER — Inpatient Hospital Stay: Payer: 59

## 2019-10-17 ENCOUNTER — Encounter: Payer: Self-pay | Admitting: Radiation Oncology

## 2019-10-18 ENCOUNTER — Other Ambulatory Visit: Payer: Self-pay | Admitting: Family

## 2019-10-20 MED ORDER — LISINOPRIL-HYDROCHLOROTHIAZIDE 20-25 MG PO TABS: 1.0000 | ORAL_TABLET | Freq: Every day | ORAL | 0 refills | Status: DC

## 2019-10-20 NOTE — Progress Notes (Incomplete)
  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 She did report occasional mild headaches that were controlled with Tylenol. She also reported fatigue, ongoing nausea that was controlled with medication, and feeling off-balance when bending forward. She also experienced some situational depression for which I recommended that she consult with her PCP for medication. She denied any suicidal ideations.  She was noted to have alopecia from chemotherapy with minimal scalp erythema. She did begin to have some hair growth after completion of chemotherapy. There was no secondary infection in the oral cavity. Of note, she did not wish to take steroids given the significant side effects that she experiences.  Plan: The patient will follow-up with radiation oncology in one month.  ________________________________________________   Blair Promise, PhD, MD  This document serves as a record of services personally performed by Gery Pray, MD. It was created on his behalf by Clerance Lav, a trained medical scribe. The creation of this record is based on the scribe's personal observations and the provider's statements to  them. This document has been checked and approved by the attending provider.

## 2019-10-20 NOTE — Addendum Note (Signed)
Addended by: [name] on: 10/20/2019 10:14 AM   Modules accepted: Orders

## 2019-10-22 ENCOUNTER — Other Ambulatory Visit: Payer: Self-pay | Admitting: Family

## 2019-10-22 ENCOUNTER — Encounter: Payer: Self-pay | Admitting: Radiation Oncology

## 2019-10-25 ENCOUNTER — Other Ambulatory Visit: Payer: Self-pay | Admitting: Endocrinology

## 2019-10-29 ENCOUNTER — Other Ambulatory Visit: Payer: Self-pay

## 2019-10-29 ENCOUNTER — Other Ambulatory Visit: Payer: 59 | Admitting: *Deleted

## 2019-10-29 DIAGNOSIS — E781 Pure hyperglyceridemia: Secondary | ICD-10-CM

## 2019-10-29 LAB — LIPID PANEL
Chol/HDL Ratio: 2.9 ratio (ref 0.0–4.4)
Cholesterol, Total: 121 mg/dL (ref 100–199)
HDL: 42 mg/dL (ref >39)
LDL Chol Calc (NIH): 50 mg/dL (ref 0–99)
Triglycerides: 177 mg/dL — ABNORMAL HIGH (ref 0–149)
VLDL Cholesterol Cal: 29 mg/dL (ref 5–40)

## 2019-11-05 ENCOUNTER — Inpatient Hospital Stay: Payer: 59

## 2019-11-05 ENCOUNTER — Other Ambulatory Visit: Payer: Self-pay

## 2019-11-05 ENCOUNTER — Inpatient Hospital Stay: Payer: 59 | Attending: Physician Assistant

## 2019-11-05 DIAGNOSIS — C3492 Malignant neoplasm of unspecified part of left bronchus or lung: Secondary | ICD-10-CM | POA: Insufficient documentation

## 2019-11-05 DIAGNOSIS — Z452 Encounter for adjustment and management of vascular access device: Secondary | ICD-10-CM | POA: Insufficient documentation

## 2019-11-05 DIAGNOSIS — C3490 Malignant neoplasm of unspecified part of unspecified bronchus or lung: Secondary | ICD-10-CM

## 2019-11-05 DIAGNOSIS — D701 Agranulocytosis secondary to cancer chemotherapy: Secondary | ICD-10-CM

## 2019-11-05 LAB — CBC WITH DIFFERENTIAL (CANCER CENTER ONLY)
Abs Immature Granulocytes: 0.01 10*3/uL (ref 0.00–0.07)
Basophils Absolute: 0 10*3/uL (ref 0.0–0.1)
Basophils Relative: 1 %
Eosinophils Absolute: 0.1 10*3/uL (ref 0.0–0.5)
Eosinophils Relative: 3 %
HCT: 34.2 % — ABNORMAL LOW (ref 36.0–46.0)
Hemoglobin: 12.1 g/dL (ref 12.0–15.0)
Immature Granulocytes: 0 %
Lymphocytes Relative: 26 %
Lymphs Abs: 1.2 10*3/uL (ref 0.7–4.0)
MCH: 33.1 pg (ref 26.0–34.0)
MCHC: 35.4 g/dL (ref 30.0–36.0)
MCV: 93.4 fL (ref 80.0–100.0)
Monocytes Absolute: 0.5 10*3/uL (ref 0.1–1.0)
Monocytes Relative: 10 %
Neutro Abs: 2.8 10*3/uL (ref 1.7–7.7)
Neutrophils Relative %: 60 %
Platelet Count: 133 10*3/uL — ABNORMAL LOW (ref 150–400)
RBC: 3.66 MIL/uL — ABNORMAL LOW (ref 3.87–5.11)
RDW: 13.2 % (ref 11.5–15.5)
WBC Count: 4.6 10*3/uL (ref 4.0–10.5)
nRBC: 0 % (ref 0.0–0.2)

## 2019-11-05 LAB — CMP (CANCER CENTER ONLY)
ALT: 8 U/L (ref 0–44)
AST: 12 U/L — ABNORMAL LOW (ref 15–41)
Albumin: 3.7 g/dL (ref 3.5–5.0)
Alkaline Phosphatase: 81 U/L (ref 38–126)
Anion gap: 7 (ref 5–15)
BUN: 18 mg/dL (ref 8–23)
CO2: 26 mmol/L (ref 22–32)
Calcium: 9.6 mg/dL (ref 8.9–10.3)
Chloride: 107 mmol/L (ref 98–111)
Creatinine: 0.86 mg/dL (ref 0.44–1.00)
GFR, Est AFR Am: >60 mL/min (ref >60)
GFR, Estimated: >60 mL/min (ref >60)
Glucose, Bld: 129 mg/dL — ABNORMAL HIGH (ref 70–99)
Potassium: 3.5 mmol/L (ref 3.5–5.1)
Sodium: 140 mmol/L (ref 135–145)
Total Bilirubin: 0.5 mg/dL (ref 0.3–1.2)
Total Protein: 6.6 g/dL (ref 6.5–8.1)

## 2019-11-05 MED ORDER — HEPARIN SOD (PORK) LOCK FLUSH 100 UNIT/ML IV SOLN
500.0000 [IU] | Freq: Once | INTRAVENOUS | Status: AC
  Administered 2019-11-05: 500 [IU]
  Filled 2019-11-05: qty 5

## 2019-11-05 MED ORDER — SODIUM CHLORIDE 0.9% FLUSH
10.0000 mL | Freq: Once | INTRAVENOUS | Status: AC
  Administered 2019-11-05: 10 mL
  Filled 2019-11-05: qty 10

## 2019-11-05 NOTE — Progress Notes (Signed)
Radiation Oncology         [phone] ________________________________  Name: Christy Abbott MRN: [id number]  Date: 11/06/2019  DOB: 1955-08-26  Follow-Up Visit Note  CC: [name], NP  Tish Men, MD    ICD-10-CM   1. Small cell lung cancer (HCC)  C34.90   2. Abnormal CT of the chest  R93.89   3. Primary malignant neoplasm of lung with metastasis to brain [facility])  C34.90 MR Brain W Wo Contrast   C79.31     Diagnosis: Stage IIB (cT1cN1M0) limited stage small cell carcinoma of the left lungnow with brainmetastases  Interval Since Last Radiation: One month and five days.  Radiation Treatment Dates: 09/11/2019 through 10/01/2019 Site Technique Total Dose (Gy) Dose per Fx (Gy) Completed Fx Beam Energies  Brain: Brain Complex 35/35 2.5 14/14 6X    Narrative:  The patient returns today for routine follow-up. No significant interval history since the end of treatment.  On review of systems, she reports numbness in her hands and feet that began at the end of treatment. She denies shortness of breath, cough, chest pain, and poor appetite.      She has developed some headaches recently and is concerned about this issue.                  ALLERGIES:  is allergic to jardiance [empagliflozin] and trazodone and nefazodone.  Meds: Current Outpatient Medications  Medication Sig Dispense Refill  . Calcium Carb-Cholecalciferol (CALTRATE 600+D3 PO) Take 1 tablet by mouth daily.    . carvedilol (COREG) 12.5 MG tablet Take 1 tablet (12.5 mg total) by mouth in the morning and at bedtime. 180 tablet 3  . diphenhydramine-acetaminophen (TYLENOL PM) 25-500 MG TABS tablet Take 2 tablets by mouth at bedtime.    . flecainide (TAMBOCOR) 50 MG tablet TAKE 1 TABLET BY MOUTH TWICE DAILY, MAY TAKE AN ADDITIONAL 1 TABLET AS NEEDED. 270 tablet 2  . insulin aspart (NOVOLOG) 100 UNIT/ML FlexPen Inject subcutaneously 3 times daily prior to meals per sliding scale 15 mL 1  . insulin glargine (LANTUS) 100  unit/mL SOPN Inject 0.1 mLs (10 Units total) into the skin daily. (Patient taking differently: Inject 15 Units into the skin at bedtime. ) 15 mL 2  . lidocaine-prilocaine (EMLA) cream Apply 1 application topically as needed. Using a cotton ball , apply a small amount of cream to the skin over the porta cath site. Do not rub in the cream. Cover with Plastic wrap. 30 g 0  . lisinopril-hydrochlorothiazide (ZESTORETIC) 20-25 MG tablet Take 1 tablet by mouth once daily 90 tablet 0  . metFORMIN (GLUCOPHAGE) 500 MG tablet TAKE 2 TABLETS BY MOUTH TWICE DAILY ( APPOINTMENT REQUIRED FOR FUTURE REFILLS) 120 tablet 0  . methimazole (TAPAZOLE) 10 MG tablet Take 1 tablet (10 mg total) by mouth daily. 90 tablet 0  . potassium chloride SA (KLOR-CON) 20 MEQ tablet TAKE 1  BY MOUTH ONCE DAILY 90 tablet 0  . simvastatin (ZOCOR) 10 MG tablet TAKE 1 TABLET BY MOUTH AT BEDTIME 90 tablet 1  . Tetrahydrozoline HCl (VISINE OP) Place 1 drop into both eyes daily as needed (irritation).    Marland Kitchen albuterol (VENTOLIN HFA) 108 (90 Base) MCG/ACT inhaler Inhale 2 puffs into the lungs every 6 (six) hours as needed for wheezing or shortness of breath. (Patient not taking: Reported on 11/06/2019) 6.7 g 0  . Continuous Blood Gluc Sensor (FREESTYLE LIBRE 2 SENSOR) MISC USE TO CHECK BLOOD SUGARS EVERY 14 DAYS.  2 each 0  . furosemide (LASIX) 20 MG tablet Take 1 tablet (20 mg total) by mouth daily as needed. (Patient not taking: Reported on 11/06/2019) 60 tablet 0  . Insulin Pen Needle (B-D UF III MINI PEN NEEDLES) 31G X 5 MM MISC Use with insulin pen 100 each 3  . Lancets 30G MISC Check sugar twice daily. 100 each 3  . LORazepam (ATIVAN) 0.5 MG tablet Place 1 tablet (0.5 mg total) under the tongue every 8 (eight) hours as needed (nausea). (Patient not taking: Reported on 11/06/2019) 30 tablet 0  . magnesium oxide (MAG-OX) 400 (241.3 Mg) MG tablet Take 1 tablet (400 mg total) by mouth 3 (three) times daily. (Patient not taking: Reported on  11/06/2019) 90 tablet 0  . Omega-3 Fatty Acids (FISH OIL) 1200 MG CAPS Take 1,200 mg by mouth daily.  (Patient not taking: Reported on 11/06/2019)     No current facility-administered medications for this encounter.    Physical Findings: The patient is in no acute distress. Patient is alert and oriented.  height is 5\' 4"  (1.626 m) and weight is 133 lb 6.4 oz (60.5 kg). Her temperature is 98 F (36.7 C). Her blood pressure is 149/87 (abnormal) and her pulse is 74. Her respiration is 18 and oxygen saturation is 100%.  Lungs are clear to auscultation bilaterally. Heart has regular rate and rhythm. No palpable cervical, supraclavicular, or axillary adenopathy. Abdomen soft, non-tender, normal bowel sounds.  The neurological examination is nonfocal.  Oral cavity is free of secondary infection, mucosa moist.  Lab Findings: Lab Results  Component Value Date   WBC 4.6 11/05/2019   HGB 12.1 11/05/2019   HCT 34.2 (L) 11/05/2019   MCV 93.4 11/05/2019   PLT 133 (L) 11/05/2019    Radiographic Findings: No results found.  Impression: Stage IIB (cT1cN1M0) limited stage small cell carcinoma of the left lungnow with brainmetastases  The patient is recovering from the effects of radiation.  As above the patient has had some headaches recently was concerned about this issue.  Plan: The patient is scheduled to follow-up with Dr. Julien Nordmann on 11/18/2019.  Given her problems with headaches we will go ahead and order another MRI of the brain to follow-up on her recent brain radiation treatments and to assess for reasons of her headache.   ____________________________________   Blair Promise, PhD, MD  This document serves as a record of services personally performed by Gery Pray, MD. It was created on his behalf by Clerance Lav, a trained medical scribe. The creation of this record is based on the scribe's personal observations and the provider's statements to them. This document has been checked and  approved by the attending provider.

## 2019-11-06 ENCOUNTER — Ambulatory Visit
Admission: RE | Admit: 2019-11-06 | Discharge: 2019-11-06 | Disposition: A | Discharge status: Home / Self Care | Payer: 59 | Source: Ambulatory Visit | Attending: Radiation Oncology | Admitting: Radiation Oncology

## 2019-11-06 ENCOUNTER — Other Ambulatory Visit: Payer: Self-pay

## 2019-11-06 ENCOUNTER — Encounter: Payer: Self-pay | Admitting: Radiation Oncology

## 2019-11-06 VITALS — BP 149/87 | HR 74 | Temp 98.0°F | Resp 18 | Ht 64.0 in | Wt 133.4 lb

## 2019-11-06 DIAGNOSIS — C3412 Malignant neoplasm of upper lobe, left bronchus or lung: Secondary | ICD-10-CM | POA: Diagnosis not present

## 2019-11-06 DIAGNOSIS — Z79899 Other long term (current) drug therapy: Secondary | ICD-10-CM | POA: Insufficient documentation

## 2019-11-06 DIAGNOSIS — R51 Headache with orthostatic component, not elsewhere classified: Secondary | ICD-10-CM | POA: Insufficient documentation

## 2019-11-06 DIAGNOSIS — C779 Secondary and unspecified malignant neoplasm of lymph node, unspecified: Secondary | ICD-10-CM | POA: Insufficient documentation

## 2019-11-06 DIAGNOSIS — R9389 Abnormal findings on diagnostic imaging of other specified body structures: Secondary | ICD-10-CM

## 2019-11-06 DIAGNOSIS — C7931 Secondary malignant neoplasm of brain: Secondary | ICD-10-CM | POA: Diagnosis present

## 2019-11-06 DIAGNOSIS — Z923 Personal history of irradiation: Secondary | ICD-10-CM | POA: Insufficient documentation

## 2019-11-06 DIAGNOSIS — C3490 Malignant neoplasm of unspecified part of unspecified bronchus or lung: Secondary | ICD-10-CM

## 2019-11-06 NOTE — Progress Notes (Signed)
Patient here for a one month f/u visit with Dr. Sondra Come. Denies shortness of breath, coughing or pain. Reports having a good appetite. States she started having numbness in her hands and feet at the end of radiation. She states she drops things frequently.  BP (!) 149/87 (BP Location: Left Arm, Patient Position: Sitting, Cuff Size: Normal)   Pulse 74   Temp 98 F (36.7 C)   Resp 18   Ht 5\' 4"  (1.626 m)   Wt 133 lb 6.4 oz (60.5 kg)   SpO2 100%   BMI 22.90 kg/m   Wt Readings from Last 3 Encounters:  11/06/19 133 lb 6.4 oz (60.5 kg)  09/19/19 143 lb 9.6 oz (65.1 kg)  09/10/19 145 lb 12.8 oz (66.1 kg)

## 2019-11-13 ENCOUNTER — Ambulatory Visit (HOSPITAL_COMMUNITY): Payer: 59

## 2019-11-14 ENCOUNTER — Ambulatory Visit (HOSPITAL_COMMUNITY): Payer: 59

## 2019-11-18 ENCOUNTER — Inpatient Hospital Stay: Payer: 59 | Admitting: Internal Medicine

## 2019-11-20 ENCOUNTER — Ambulatory Visit (HOSPITAL_COMMUNITY)
Admission: RE | Admit: 2019-11-20 | Discharge: 2019-11-20 | Disposition: A | Discharge status: Home / Self Care | Payer: 59 | Source: Ambulatory Visit | Attending: Radiation Oncology | Admitting: Radiation Oncology

## 2019-11-20 ENCOUNTER — Encounter (HOSPITAL_COMMUNITY): Payer: Self-pay

## 2019-11-20 ENCOUNTER — Ambulatory Visit (HOSPITAL_COMMUNITY)
Admission: RE | Admit: 2019-11-20 | Discharge: 2019-11-20 | Disposition: A | Discharge status: Home / Self Care | Payer: 59 | Source: Ambulatory Visit | Attending: Physician Assistant | Admitting: Physician Assistant

## 2019-11-20 ENCOUNTER — Other Ambulatory Visit: Payer: Self-pay

## 2019-11-20 DIAGNOSIS — C7931 Secondary malignant neoplasm of brain: Secondary | ICD-10-CM | POA: Insufficient documentation

## 2019-11-20 DIAGNOSIS — C3490 Malignant neoplasm of unspecified part of unspecified bronchus or lung: Secondary | ICD-10-CM | POA: Insufficient documentation

## 2019-11-20 HISTORY — DX: Malignant (primary) neoplasm, unspecified: C80.1

## 2019-11-20 IMAGING — CT CT CHEST W/ CM
2 of 3 series · 15 of 36 positions shown, 18 images · IV contrast (OMNIPAQUE)
Comparison: [DATE]

CLINICAL DATA: Staging of small cell lung cancer

EXAM:
CT CHEST WITH CONTRAST
TECHNIQUE: Multidetector CT imaging of the chest was performed during
intravenous contrast administration.
CONTRAST:  75mL OMNIPAQUE IOHEXOL 300 MG/ML  SOLN

[Series 2: axial st · axial · 0.64mm/px · z∈[+1294,+1570]mm · 12 of 162 slices shown, 15 images]
[im 12/162  mediastinal]
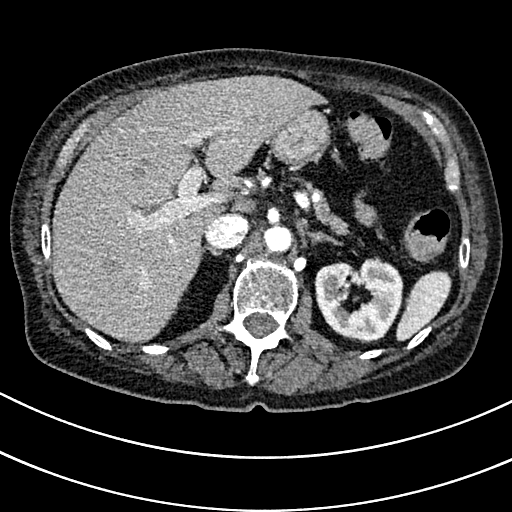
[im 12/162  lung]
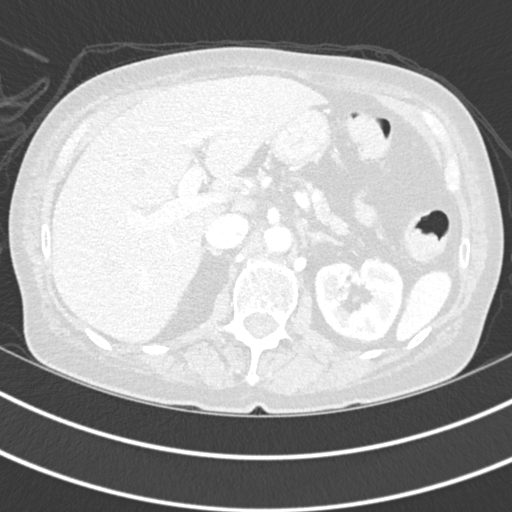
[im 24/162  lung]
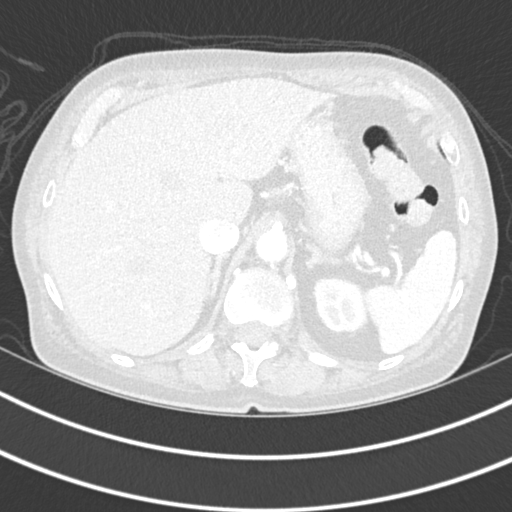
[im 36/162  lung]
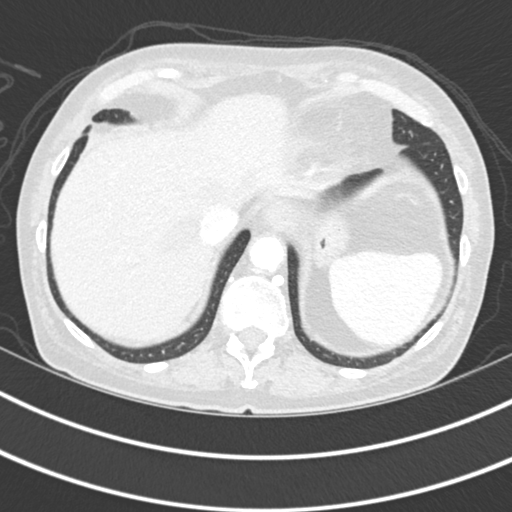
[im 48/162  lung]
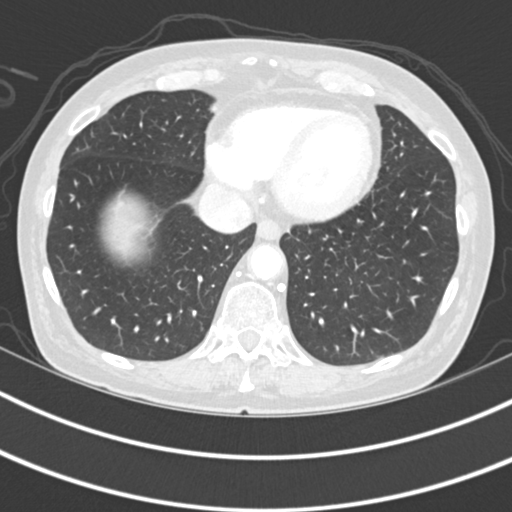
[im 60/162  mediastinal]
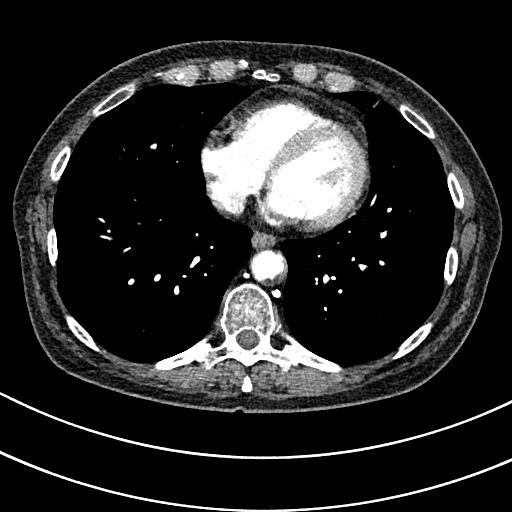
[im 60/162  lung]
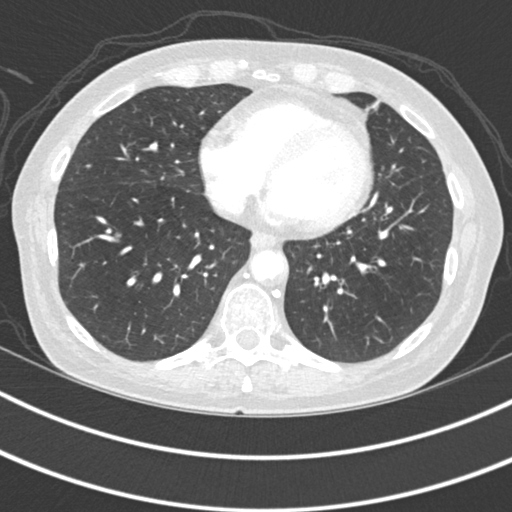
[im 72/162  lung]
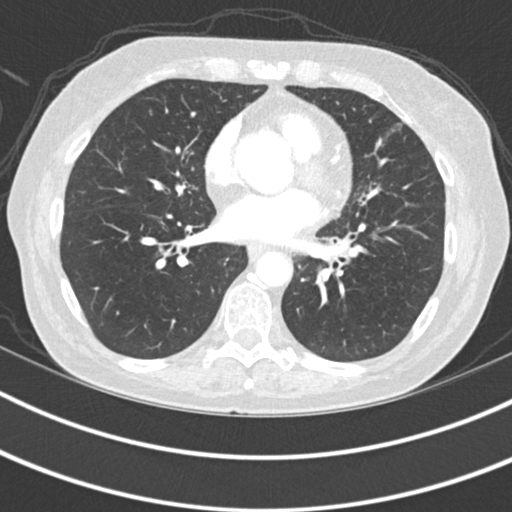
[im 90/162  lung]
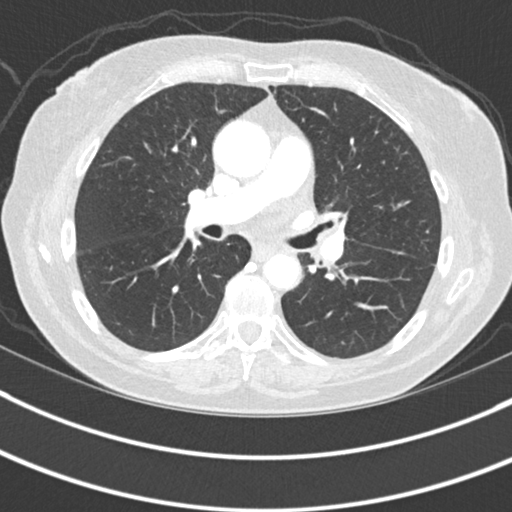
[im 102/162  lung]
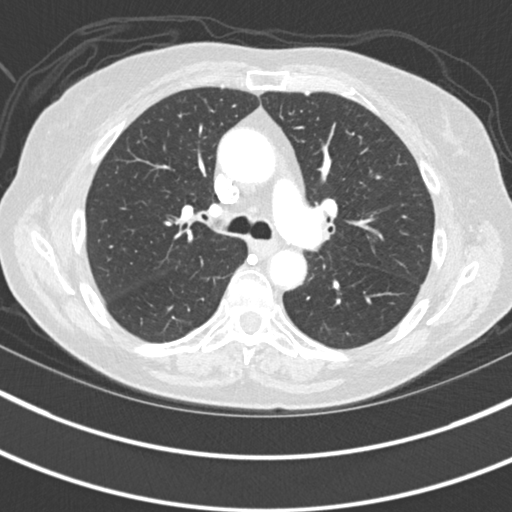
[im 114/162  mediastinal]
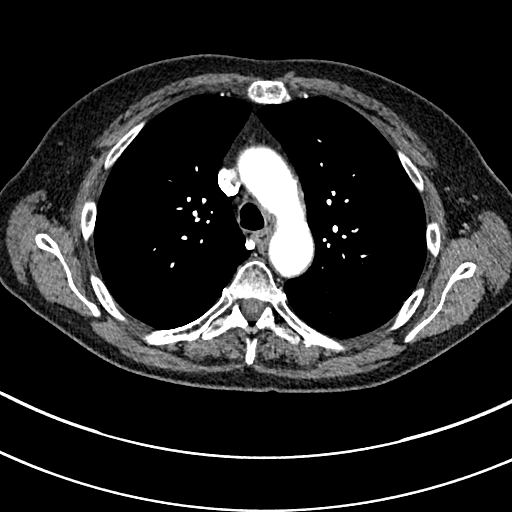
[im 114/162  lung]
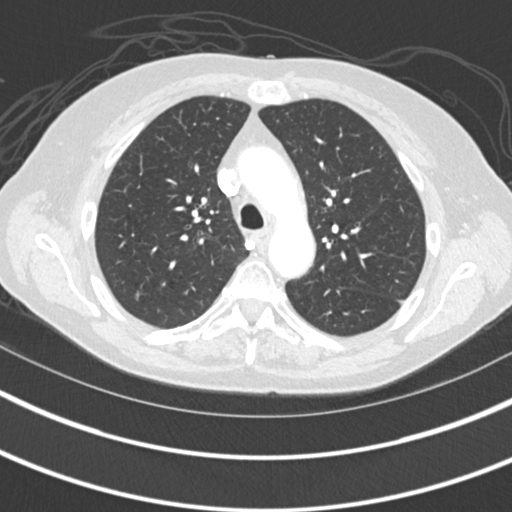
[im 126/162  lung]
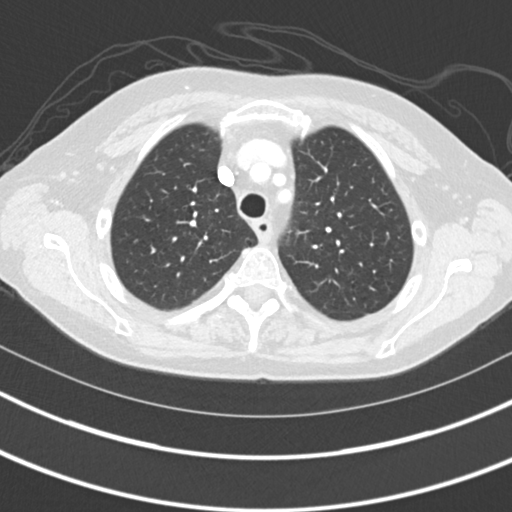
[im 138/162  lung]
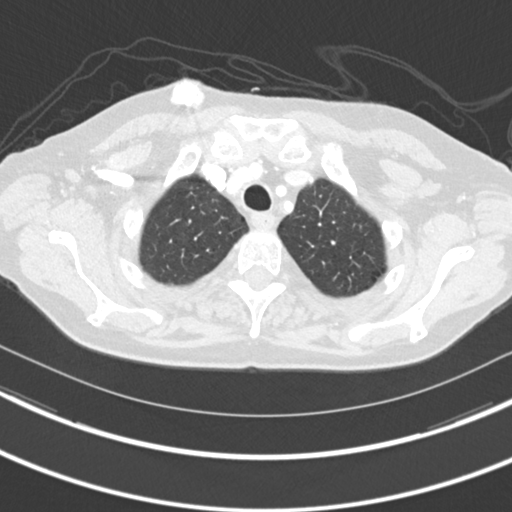
[im 150/162  lung]
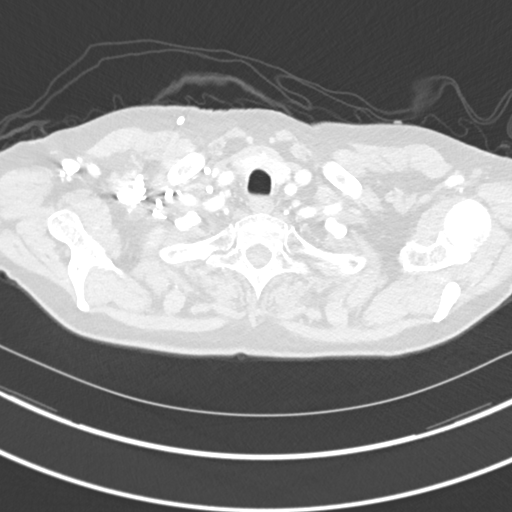

[Series 6: coronal · coronal · 0.67mm/px · 3 of 134 slices shown]
[im 27/134  lung]
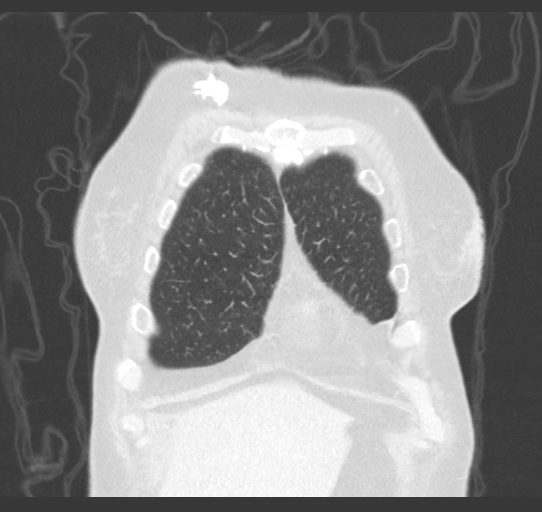
[im 54/134  lung]
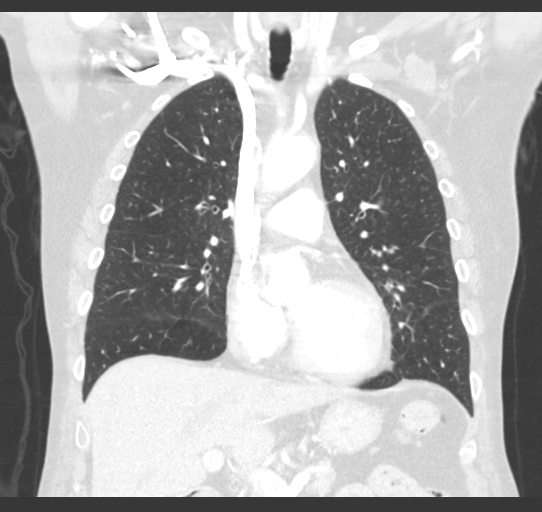
[im 80/134  lung]
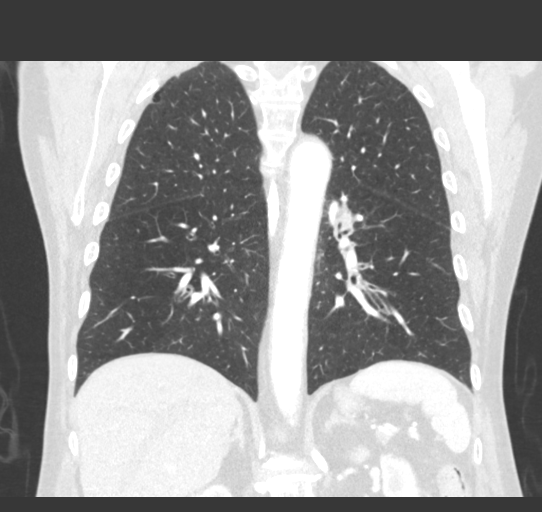

[15 of 36 positions shown; findings below may reference images not displayed]

FINDINGS: Cardiovascular: Calcified and noncalcified atheromatous plaque of
the thoracic aorta. No sign of aneurysmal dilation. RIGHT-sided
Port-A-Cath terminates in the upper RIGHT atrium.

Central pulmonary vasculature unremarkable on venous phase
assessment.

Heart size normal with calcified coronary artery disease and without
signs of pericardial effusion.

Mediastinum/Nodes: Thoracic inlet structures are unremarkable. No
axillary lymphadenopathy. No mediastinal lymphadenopathy. No hilar
lymphadenopathy. Esophagus grossly normal.

Lungs/Pleura: Mild paraseptal emphysematous changes at the lung
apices also with mild centrilobular emphysema. Linear scarring in
the lingula is unchanged. No sign of suspicious mass or nodule.

Only ground-glass without discrete nodularity remains at the site of
a central pulmonary nodule in the LEFT upper lobe. No effusion.

Stable subtle juxtapleural nodularity in the superior segment of the
RIGHT lower lobe. (Image 62, series 5) approximately 5-6 mm,
potentially related to scarring. Subtle nodularity in the LEFT lower
lobe seen on the prior study is no longer visible.

Upper Abdomen: Adrenal glands are normal.

Visualized portions of the kidneys with normal enhancement. Of upper
abdominal viscera are unremarkable. No acute upper abdominal process
on limited assessment.

Musculoskeletal: No acute or destructive bone finding.
IMPRESSION: 1. Only ground-glass without discrete nodularity remains at the site
of a central pulmonary nodule in the LEFT upper lobe. Other areas of
nodularity have likewise resolved. Minimal nodularity along the
pleural surface in the posterior RIGHT chest is stable over a series
of prior exams and may represent scarring.
2. No new signs of disease.
3. Coronary artery and aortic atherosclerosis.
4. Emphysema and aortic atherosclerosis.

Aortic Atherosclerosis ([Y8]-[Y8]) and Emphysema ([Y8]-[Y8]).

## 2019-11-20 IMAGING — MR MR HEAD WO/W CM
16 of 17 series · 41 of 48 positions shown · IV contrast (gadavist)
Comparison: [DATE]

CLINICAL DATA: Lung cancer with brain metastases.  Surveillance

EXAM:
MRI HEAD WITHOUT AND WITH CONTRAST
TECHNIQUE: Multiplanar, multiecho pulse sequences of the brain and surrounding
structures were obtained without and with intravenous contrast.
CONTRAST:  6mL GADAVIST GADOBUTROL 1 MMOL/ML IV SOLN

[Series 9: DWI · axial · 3.0mm · 1.36mm/px · z∈[-39,+107]mm · 5 of 100 slices shown (1 of 4)]
[im 1/100]
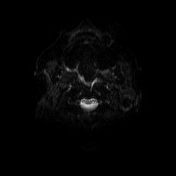
[im 25/100]
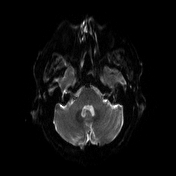
[im 50/100]
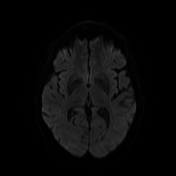
[im 75/100]
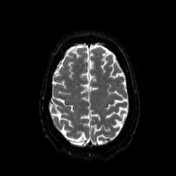
[im 100/100]
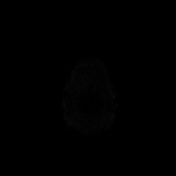

[Series 10: DWI · axial · 3.0mm · 1.36mm/px · z∈[-39,+107]mm · 2 of 50 slices shown (2 of 4)]
[im 1/50]
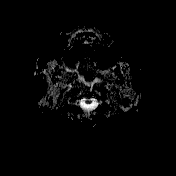
[im 50/50]
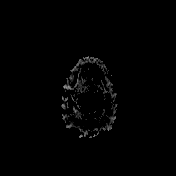

[Series 11: T1 · sagittal · 5.0mm · 0.75mm/px · 1 of 24 slices shown (1 of 4)]
[im 1/24]
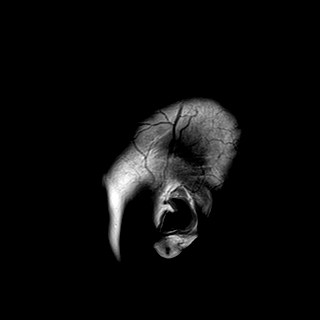

[Series 12: T2 · axial · 5.0mm · 0.62mm/px · 1 of 26 slices shown]
[im 1/26]
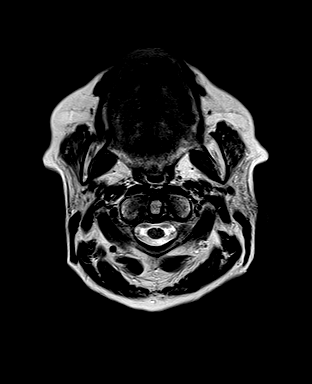

[Series 13: mip_images(sw) · axial · 24.0mm · 0.75mm/px · z∈[-37,+94]mm · 2 of 45 slices shown]
[im 1/45]
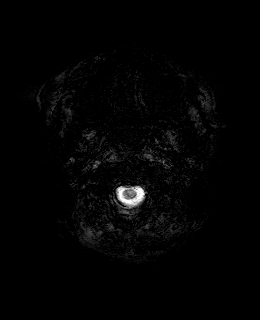
[im 45/45]
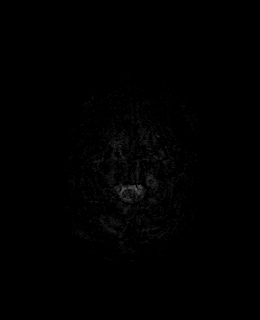

[Series 14: swi_images · axial · 3.0mm · 0.75mm/px · z∈[-48,+105]mm · 3 of 52 slices shown]
[im 1/52]
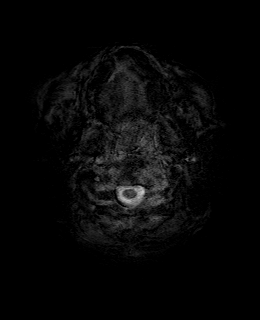
[im 26/52]
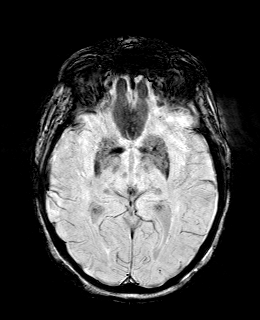
[im 52/52]
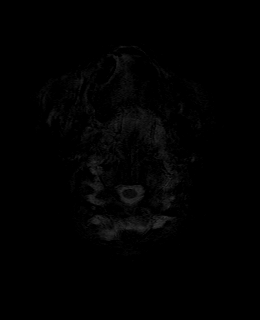

[Series 15: FLAIR · axial · 3.0mm · 0.75mm/px · z∈[-48,+105]mm · 3 of 52 slices shown]
[im 1/52]
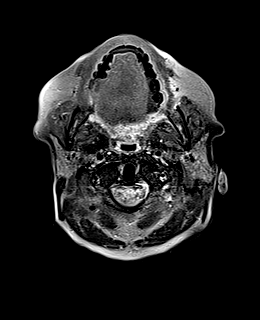
[im 26/52]
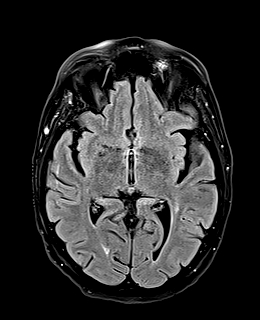
[im 52/52]
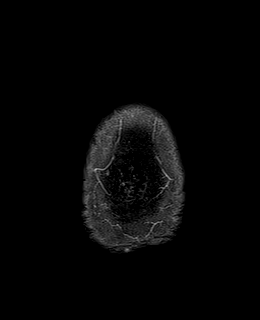

[Series 16: T1 · axial · 1.0mm · 0.94mm/px · z∈[-37,+105]mm · 7 of 144 slices shown (2 of 4)]
[im 1/144]
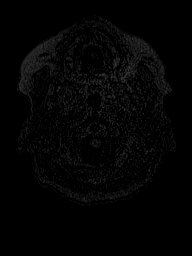
[im 24/144]
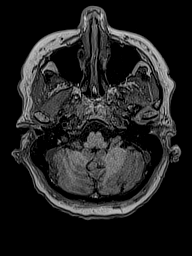
[im 48/144]
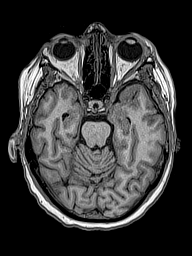
[im 72/144]
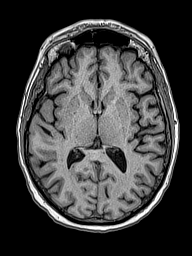
[im 96/144]
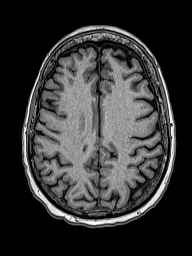
[im 120/144]
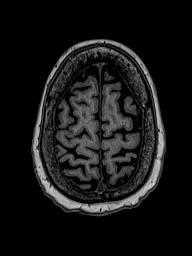
[im 144/144]
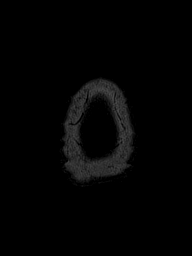

[Series 17: DWI · coronal · 5.0mm · 1.31mm/px · 3 of 64 slices shown (3 of 4)]
[im 1/64]
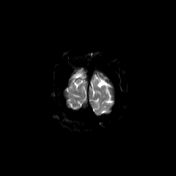
[im 32/64]
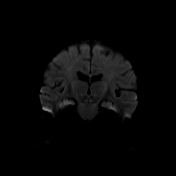
[im 64/64]
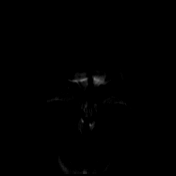

[Series 18: DWI · coronal · 5.0mm · 1.31mm/px · 2 of 32 slices shown (4 of 4)]
[im 1/32]
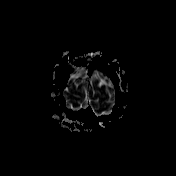
[im 32/32]
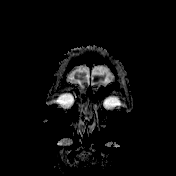

[Series 19: T2 post-contrast · coronal · 5.0mm · 0.57mm/px · 1 of 24 slices shown]
[im 1/24]
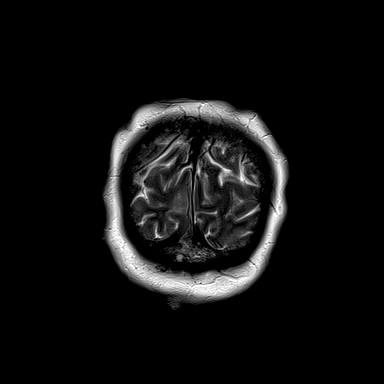

[Series 20: T1 post-contrast · axial · 1.0mm · 0.94mm/px · z∈[-37,+105]mm · 7 of 144 slices shown (1 of 3)]
[im 1/144]
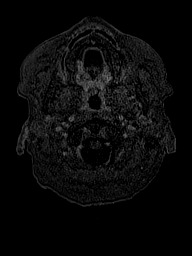
[im 24/144]
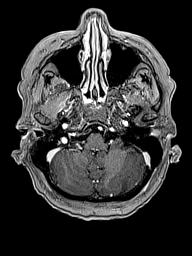
[im 48/144]
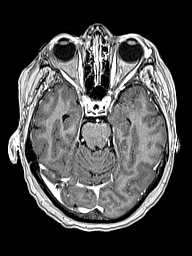
[im 72/144]
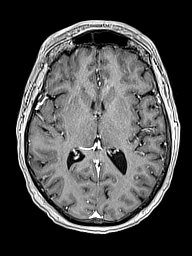
[im 96/144]
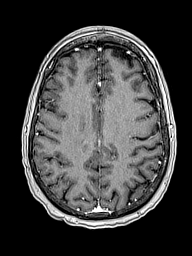
[im 120/144]
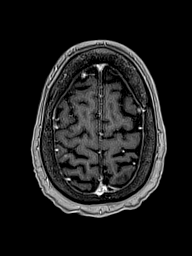
[im 144/144]
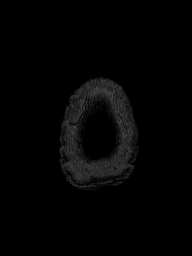

[Series 21: T1 · sagittal · 4.0mm · 0.94mm/px · 1 of 30 slices shown (3 of 4)]
[im 1/30]
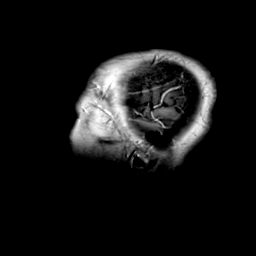

[Series 22: T1 · coronal · 4.0mm · 0.94mm/px · 1 of 30 slices shown (4 of 4)]
[im 1/30]
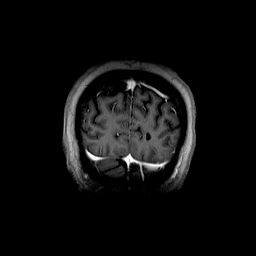

[Series 23: T1 post-contrast · coronal · 5.0mm · 0.43mm/px · 1 of 24 slices shown (2 of 3)]
[im 1/24]
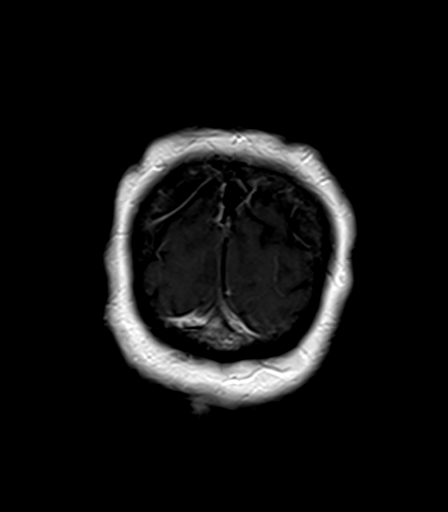

[Series 24: T1 post-contrast · sagittal · 5.0mm · 0.75mm/px · 1 of 24 slices shown (3 of 3)]
[im 1/24]
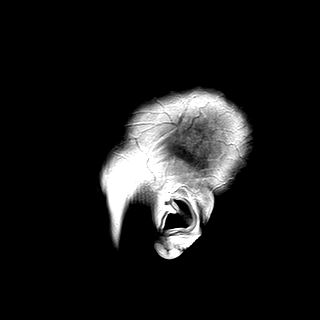

[41 of 48 positions shown; findings below may reference images not displayed]

FINDINGS: BRAIN

New Lesions: None.

Larger lesions: None.

Stable or Smaller lesions:

Multiple brain lesions consistent with metastatic disease. A few of
these have resolved since prior. Remaining lesions are all
subcentimeter in size in either nodular or ring like enhancement.
All reference numbers are from series 20:

Upper right cerebellum, image 46

lateral left cerebellum on image 30

Inferior left temporal lobe on image 37

Lateral right frontal lobe on image 80

Left mid frontal cortex on image 96

Anterior right frontal cortex on image 108

High right frontal parietal cortex on image 111. This is the largest
lesion at 5 mm

Precentral gyrus on the right on image 121

Frontoparietal junction on the right at 124

High left frontal cortex on 128.

Other Brain findings: No brain swelling, infarct, hydrocephalus, or
collection.

Vascular: Normal flow voids and vascular enhancements

Skull and upper cervical spine: Normal marrow signal

Sinuses/Orbits: Negative.  Nasopharyngeal proteinaceous cysts.
IMPRESSION: Brain metastases are all stable to regressed in size. No new
disease.

## 2019-11-20 MED ORDER — IOHEXOL 300 MG/ML  SOLN
75.0000 mL | Freq: Once | INTRAMUSCULAR | Status: AC | PRN
  Administered 2019-11-20: 75 mL via INTRAVENOUS

## 2019-11-20 MED ORDER — GADOBUTROL 1 MMOL/ML IV SOLN
6.0000 mL | Freq: Once | INTRAVENOUS | Status: AC | PRN
  Administered 2019-11-20: 6 mL via INTRAVENOUS

## 2019-11-24 ENCOUNTER — Other Ambulatory Visit: Payer: Self-pay

## 2019-11-24 ENCOUNTER — Ambulatory Visit
Admission: RE | Admit: 2019-11-24 | Discharge: 2019-11-24 | Disposition: A | Discharge status: Home / Self Care | Payer: 59 | Source: Ambulatory Visit | Attending: Radiation Oncology | Admitting: Radiation Oncology

## 2019-11-24 ENCOUNTER — Encounter: Payer: Self-pay | Admitting: Radiation Oncology

## 2019-11-24 VITALS — BP 143/87 | HR 78 | Temp 97.9°F | Resp 18 | Ht 64.0 in | Wt 131.6 lb

## 2019-11-24 DIAGNOSIS — J439 Emphysema, unspecified: Secondary | ICD-10-CM | POA: Insufficient documentation

## 2019-11-24 DIAGNOSIS — I700 Atherosclerosis of aorta: Secondary | ICD-10-CM | POA: Insufficient documentation

## 2019-11-24 DIAGNOSIS — C7931 Secondary malignant neoplasm of brain: Secondary | ICD-10-CM | POA: Diagnosis present

## 2019-11-24 DIAGNOSIS — C3490 Malignant neoplasm of unspecified part of unspecified bronchus or lung: Secondary | ICD-10-CM

## 2019-11-24 DIAGNOSIS — Z79899 Other long term (current) drug therapy: Secondary | ICD-10-CM | POA: Diagnosis not present

## 2019-11-24 DIAGNOSIS — C3412 Malignant neoplasm of upper lobe, left bronchus or lung: Secondary | ICD-10-CM | POA: Insufficient documentation

## 2019-11-24 NOTE — Progress Notes (Signed)
Radiation Oncology         [phone] ________________________________  Name: Christy Abbott MRN: [id number]  Date: 11/24/2019  DOB: 21-Jul-1955  Follow-Up Visit Note  CC: [name], NP  Tish Men, MD    ICD-10-CM   1. Primary malignant neoplasm of lung with metastasis to brain (HCC)  C34.90    C79.31   2. Small cell lung cancer (HCC)  C34.90     Diagnosis: Stage IIB (cT1cN1M0) limited stage small cell carcinoma of the left lungnow with brainmetastases  Interval Since Last Radiation: One month, three weeks, and two days.  Radiation Treatment Dates: 09/11/2019 through 10/01/2019 Site Technique Total Dose (Gy) Dose per Fx (Gy) Completed Fx Beam Energies  Brain: Brain Complex 35/35 2.5 14/14 6X    Narrative:  The patient returns today for follow-up and to discuss recent MRI results. MRI of brain was performed on 11/20/2019 and showed that all brain metastases were stable to regressed in size. No new disease was seen.  The largest lesion was less than 5 mm in size.the patient underwent a chest CT scan on that same day that showed only ground-glass without discrete nodularity remaining at the site of a central pulmonary nodule in the left upper lobe. Other areas of nodularity had likewise resolved. There was minimal nodularity along the pleural space in the posterior right chest that was stable over a series of prior exams and may represent scarring. There were no new signs of disease.  On review of systems, she reports no complaints.  She occasionally will have some mild headaches along the left parietal region only last for 10 minutes or so and her she does not requiring any pain medication for this issue.            ALLERGIES:  is allergic to jardiance [empagliflozin] and trazodone and nefazodone.  Meds: Current Outpatient Medications  Medication Sig Dispense Refill  . albuterol (VENTOLIN HFA) 108 (90 Base) MCG/ACT inhaler Inhale 2 puffs into the lungs every 6 (six) hours  as needed for wheezing or shortness of breath. (Patient not taking: Reported on 11/06/2019) 6.7 g 0  . Calcium Carb-Cholecalciferol (CALTRATE 600+D3 PO) Take 1 tablet by mouth daily.    . carvedilol (COREG) 12.5 MG tablet Take 1 tablet (12.5 mg total) by mouth in the morning and at bedtime. 180 tablet 3  . Continuous Blood Gluc Sensor (FREESTYLE LIBRE 2 SENSOR) MISC USE TO CHECK BLOOD SUGARS EVERY 14 DAYS. 2 each 0  . diphenhydramine-acetaminophen (TYLENOL PM) 25-500 MG TABS tablet Take 2 tablets by mouth at bedtime.    . flecainide (TAMBOCOR) 50 MG tablet TAKE 1 TABLET BY MOUTH TWICE DAILY, MAY TAKE AN ADDITIONAL 1 TABLET AS NEEDED. 270 tablet 2  . furosemide (LASIX) 20 MG tablet Take 1 tablet (20 mg total) by mouth daily as needed. (Patient not taking: Reported on 11/06/2019) 60 tablet 0  . insulin aspart (NOVOLOG) 100 UNIT/ML FlexPen Inject subcutaneously 3 times daily prior to meals per sliding scale 15 mL 1  . insulin glargine (LANTUS) 100 unit/mL SOPN Inject 0.1 mLs (10 Units total) into the skin daily. (Patient taking differently: Inject 15 Units into the skin at bedtime. ) 15 mL 2  . Insulin Pen Needle (B-D UF III MINI PEN NEEDLES) 31G X 5 MM MISC Use with insulin pen 100 each 3  . Lancets 30G MISC Check sugar twice daily. 100 each 3  . lidocaine-prilocaine (EMLA) cream Apply 1 application topically as needed. Using a cotton  ball , apply a small amount of cream to the skin over the porta cath site. Do not rub in the cream. Cover with Plastic wrap. 30 g 0  . lisinopril-hydrochlorothiazide (ZESTORETIC) 20-25 MG tablet Take 1 tablet by mouth once daily 90 tablet 0  . LORazepam (ATIVAN) 0.5 MG tablet Place 1 tablet (0.5 mg total) under the tongue every 8 (eight) hours as needed (nausea). (Patient not taking: Reported on 11/06/2019) 30 tablet 0  . magnesium oxide (MAG-OX) 400 (241.3 Mg) MG tablet Take 1 tablet (400 mg total) by mouth 3 (three) times daily. (Patient not taking: Reported on 11/06/2019) 90  tablet 0  . metFORMIN (GLUCOPHAGE) 500 MG tablet TAKE 2 TABLETS BY MOUTH TWICE DAILY ( APPOINTMENT REQUIRED FOR FUTURE REFILLS) 120 tablet 0  . methimazole (TAPAZOLE) 10 MG tablet Take 1 tablet (10 mg total) by mouth daily. 90 tablet 0  . Omega-3 Fatty Acids (FISH OIL) 1200 MG CAPS Take 1,200 mg by mouth daily.  (Patient not taking: Reported on 11/06/2019)    . potassium chloride SA (KLOR-CON) 20 MEQ tablet TAKE 1  BY MOUTH ONCE DAILY 90 tablet 0  . simvastatin (ZOCOR) 10 MG tablet TAKE 1 TABLET BY MOUTH AT BEDTIME 90 tablet 1  . Tetrahydrozoline HCl (VISINE OP) Place 1 drop into both eyes daily as needed (irritation).     No current facility-administered medications for this encounter.    Physical Findings: The patient is in no acute distress. Patient is alert and oriented. Ac Companied by husband on evaluation today  height is 5\' 4"  (1.626 m) and weight is 131 lb 9.6 oz (59.7 kg). Her temperature is 97.9 F (36.6 C). Her blood pressure is 143/87 (abnormal) and her pulse is 78. Her respiration is 18 and oxygen saturation is 99%.  Lungs are clear to auscultation bilaterally. Heart has regular rate and rhythm. No palpable cervical, supraclavicular, or axillary adenopathy. Abdomen soft, non-tender, normal bowel sounds. The neurological examination is nonfocal.    Lab Findings: Lab Results  Component Value Date   WBC 4.6 11/05/2019   HGB 12.1 11/05/2019   HCT 34.2 (L) 11/05/2019   MCV 93.4 11/05/2019   PLT 133 (L) 11/05/2019    Radiographic Findings: CT Chest W Contrast  Result Date: 11/20/2019 CLINICAL DATA:  Staging of small cell lung cancer EXAM: CT CHEST WITH CONTRAST TECHNIQUE: Multidetector CT imaging of the chest was performed during intravenous contrast administration. CONTRAST:  9mL OMNIPAQUE IOHEXOL 300 MG/ML  SOLN COMPARISON:  08/12/2019 FINDINGS: Cardiovascular: Calcified and noncalcified atheromatous plaque of the thoracic aorta. No sign of aneurysmal dilation. RIGHT-sided  Port-A-Cath terminates in the upper RIGHT atrium. Central pulmonary vasculature unremarkable on venous phase assessment. Heart size normal with calcified coronary artery disease and without signs of pericardial effusion. Mediastinum/Nodes: Thoracic inlet structures are unremarkable. No axillary lymphadenopathy. No mediastinal lymphadenopathy. No hilar lymphadenopathy. Esophagus grossly normal. Lungs/Pleura: Mild paraseptal emphysematous changes at the lung apices also with mild centrilobular emphysema. Linear scarring in the lingula is unchanged. No sign of suspicious mass or nodule. Only ground-glass without discrete nodularity remains at the site of a central pulmonary nodule in the LEFT upper lobe. No effusion. Stable subtle juxtapleural nodularity in the superior segment of the RIGHT lower lobe. (Image 62, series 5) approximately 5-6 mm, potentially related to scarring. Subtle nodularity in the LEFT lower lobe seen on the prior study is no longer visible. Upper Abdomen: Adrenal glands are normal. Visualized portions of the kidneys with normal enhancement. Of upper abdominal viscera  are unremarkable. No acute upper abdominal process on limited assessment. Musculoskeletal: No acute or destructive bone finding. IMPRESSION: 1. Only ground-glass without discrete nodularity remains at the site of a central pulmonary nodule in the LEFT upper lobe. Other areas of nodularity have likewise resolved. Minimal nodularity along the pleural surface in the posterior RIGHT chest is stable over a series of prior exams and may represent scarring. 2. No new signs of disease. 3. Coronary artery and aortic atherosclerosis. 4. Emphysema and aortic atherosclerosis. Aortic Atherosclerosis (ICD10-I70.0) and Emphysema (ICD10-J43.9). Electronically Signed   By: Zetta Bills M.D.   On: 11/20/2019 08:38   MR Brain W Wo Contrast  Result Date: 11/20/2019 CLINICAL DATA:  Lung cancer with brain metastases.  Surveillance EXAM: MRI HEAD  WITHOUT AND WITH CONTRAST TECHNIQUE: Multiplanar, multiecho pulse sequences of the brain and surrounding structures were obtained without and with intravenous contrast. CONTRAST:  33mL GADAVIST GADOBUTROL 1 MMOL/ML IV SOLN COMPARISON:  09/04/2019 FINDINGS: BRAIN New Lesions: None. Larger lesions: None. Stable or Smaller lesions: Multiple brain lesions consistent with metastatic disease. A few of these have resolved since prior. Remaining lesions are all subcentimeter in size in either nodular or ring like enhancement. All reference numbers are from series 20: Upper right cerebellum, image 46 lateral left cerebellum on image 30 Inferior left temporal lobe on image 37 Lateral right frontal lobe on image 80 Left mid frontal cortex on image 96 Anterior right frontal cortex on image 108 High right frontal parietal cortex on image 111. This is the largest lesion at 5 mm Precentral gyrus on the right on image 121 Frontoparietal junction on the right at 124 High left frontal cortex on 128. Other Brain findings: No brain swelling, infarct, hydrocephalus, or collection. Vascular: Normal flow voids and vascular enhancements Skull and upper cervical spine: Normal marrow signal Sinuses/Orbits: Negative.  Nasopharyngeal proteinaceous cysts. IMPRESSION: Brain metastases are all stable to regressed in size. No new disease. Electronically Signed   By: Monte Fantasia M.D.   On: 11/20/2019 08:48    Impression: Stage IIB (cT1cN1M0) limited stage small cell carcinoma of the left lungnow with brainmetastases  Recent brain MRI showed that all brain metastases were stable to regressed in size. No new disease was seen. Recent chest CT scan showed only ground-glass without discrete nodularity remaining at the site of a central pulmonary nodule in the left upper lobe. Other areas of nodularity had likewise resolved. There was minimal nodularity along the pleural space in the posterior right chest that was stable over a series of prior  exams and may represent scarring. There were no new signs of disease.  I reviewed the patient's brain MRI scan in detail with patient.  We discussed potential repeat scan in 2 months.  She is concerned given there are residual tumors is inquiring as to whether she should have additional treatment.  We did discuss potential SRS treatment in the future.  For further input we will arrange evaluation in the brain tumor conference.  Plan: The patient is scheduled to follow-up with Dr. Julien Nordmann on 11/26/2019.  She will follow-up in radiation oncology based on brain tumor conference recommendations.  Additional brain MRIs pending discussion at the brain tumor conference.   ____________________________________   Blair Promise, PhD, MD  This document serves as a record of services personally performed by Gery Pray, MD. It was created on his behalf by Clerance Lav, a trained medical scribe. The creation of this record is based on the scribe's personal observations and  the provider's statements to them. This document has been checked and approved by the attending provider.

## 2019-11-24 NOTE — Progress Notes (Signed)
Patient here for a f/u visit and to review her MRI with Dr. Sondra Come. Patient denies pain and any other sx.  BP (!) 143/87 (BP Location: Left Arm, Patient Position: Sitting, Cuff Size: Normal)   Pulse 78   Temp 97.9 F (36.6 C)   Resp 18   Ht 5\' 4"  (1.626 m)   Wt 131 lb 9.6 oz (59.7 kg)   SpO2 99%   BMI 22.59 kg/m    Wt Readings from Last 3 Encounters:  11/24/19 131 lb 9.6 oz (59.7 kg)  11/06/19 133 lb 6.4 oz (60.5 kg)  09/19/19 143 lb 9.6 oz (65.1 kg)

## 2019-11-25 ENCOUNTER — Other Ambulatory Visit: Payer: Self-pay | Admitting: Endocrinology

## 2019-11-25 ENCOUNTER — Other Ambulatory Visit: Payer: Self-pay | Admitting: Radiation Therapy

## 2019-11-26 ENCOUNTER — Inpatient Hospital Stay: Payer: 59 | Attending: Physician Assistant | Admitting: Internal Medicine

## 2019-11-26 ENCOUNTER — Other Ambulatory Visit: Payer: Self-pay

## 2019-11-26 ENCOUNTER — Encounter: Payer: Self-pay | Admitting: Internal Medicine

## 2019-11-26 VITALS — BP 154/90 | HR 72 | Temp 98.0°F | Resp 20 | Ht 64.0 in | Wt 133.0 lb

## 2019-11-26 DIAGNOSIS — Z86711 Personal history of pulmonary embolism: Secondary | ICD-10-CM | POA: Insufficient documentation

## 2019-11-26 DIAGNOSIS — I2510 Atherosclerotic heart disease of native coronary artery without angina pectoris: Secondary | ICD-10-CM | POA: Insufficient documentation

## 2019-11-26 DIAGNOSIS — Z79899 Other long term (current) drug therapy: Secondary | ICD-10-CM | POA: Insufficient documentation

## 2019-11-26 DIAGNOSIS — Z452 Encounter for adjustment and management of vascular access device: Secondary | ICD-10-CM | POA: Insufficient documentation

## 2019-11-26 DIAGNOSIS — Z794 Long term (current) use of insulin: Secondary | ICD-10-CM | POA: Diagnosis not present

## 2019-11-26 DIAGNOSIS — I10 Essential (primary) hypertension: Secondary | ICD-10-CM | POA: Insufficient documentation

## 2019-11-26 DIAGNOSIS — E785 Hyperlipidemia, unspecified: Secondary | ICD-10-CM | POA: Diagnosis not present

## 2019-11-26 DIAGNOSIS — C3492 Malignant neoplasm of unspecified part of left bronchus or lung: Secondary | ICD-10-CM | POA: Diagnosis present

## 2019-11-26 DIAGNOSIS — C3490 Malignant neoplasm of unspecified part of unspecified bronchus or lung: Secondary | ICD-10-CM

## 2019-11-26 DIAGNOSIS — Z923 Personal history of irradiation: Secondary | ICD-10-CM | POA: Diagnosis not present

## 2019-11-26 DIAGNOSIS — J439 Emphysema, unspecified: Secondary | ICD-10-CM | POA: Diagnosis not present

## 2019-11-26 DIAGNOSIS — Z9221 Personal history of antineoplastic chemotherapy: Secondary | ICD-10-CM | POA: Diagnosis not present

## 2019-11-26 DIAGNOSIS — I2699 Other pulmonary embolism without acute cor pulmonale: Secondary | ICD-10-CM | POA: Diagnosis not present

## 2019-11-26 DIAGNOSIS — Z7901 Long term (current) use of anticoagulants: Secondary | ICD-10-CM | POA: Diagnosis not present

## 2019-11-26 DIAGNOSIS — C7931 Secondary malignant neoplasm of brain: Secondary | ICD-10-CM | POA: Insufficient documentation

## 2019-11-26 DIAGNOSIS — E119 Type 2 diabetes mellitus without complications: Secondary | ICD-10-CM | POA: Insufficient documentation

## 2019-11-26 NOTE — Progress Notes (Signed)
[place] [phone]   [phone]  OFFICE PROGRESS NOTE  [name], NP [address] [place] [address]  DIAGNOSIS:  1) metastatic small cell lung cancer initially diagnosed as limited stage (T2a, N1, M0) small cell lung cancer presented with right hilar mass in addition to right hilar adenopathy diagnosed in February 2021.  The patient had brain metastasis in June 2021. 2) incidental acute left-sided pulmonary embolism involving lobar, segmental and subsegmental left lower lobe branches diagnosed in April 2021.  PRIOR THERAPY: 1) Systemic chemotherapy with cisplatin 80 mg/M2 on day 1 and etoposide 100 mg/M2 on days 1, 2 and 3 every 3 weeks.  Status post 4 cycles.  This is concurrent with radiotherapy. 2) Xarelto 15 mg p.o. twice daily for the first 3 weeks followed by 20 mg p.o. daily.  Started June 24 2019. 3) whole brain irradiation under the care of Dr. Sondra Come  CURRENT THERAPY: Observation.   INTERVAL HISTORY: Christy Abbott 64 y.o. female returns to the clinic today for follow-up visit.  The patient is feeling fine today with no concerning complaints.  She lost around 10 pounds since her last visit because of lack of appetite.  She denied having any current chest pain, shortness of breath, cough or hemoptysis.  She denied having any fever or chills.  She has no nausea, vomiting, diarrhea or constipation.  She has no headache or visual changes.  She was found to have multiple metastatic brain lesions in June 2021 and she underwent whole brain irradiation under the care of Dr. Sondra Come.  The patient is here today for evaluation with repeat CT scan of the chest as well as MRI of the brain.   MEDICAL HISTORY: Past Medical History:  Diagnosis Date   Arthritis    Atrial flutter [facility])    s/p CTI by Dr Lovena Le 02/2013   Chest pain    Diabetes mellitus without complication ([place])    Dysrhythmia    hx of Aflutter    Gastric tumor 3/16   1A gastric neuroendocrine tumor   Hemochromatosis associated with compound heterozygous mutation in HFE gene ([place]) 12/10/2017   Hyperlipidemia    Hypertension    Hyperthyroidism 10/31/2014   lung ca dx'd 03/2019    ALLERGIES:  is allergic to jardiance [empagliflozin] and trazodone and nefazodone.  MEDICATIONS:  Current Outpatient Medications  Medication Sig Dispense Refill   albuterol (VENTOLIN HFA) 108 (90 Base) MCG/ACT inhaler Inhale 2 puffs into the lungs every 6 (six) hours as needed for wheezing or shortness of breath. (Patient not taking: Reported on 11/06/2019) 6.7 g 0   Calcium Carb-Cholecalciferol (CALTRATE 600+D3 PO) Take 1 tablet by mouth daily.     carvedilol (COREG) 12.5 MG tablet Take 1 tablet (12.5 mg total) by mouth in the morning and at bedtime. 180 tablet 3   Continuous Blood Gluc Sensor (FREESTYLE LIBRE 2 SENSOR) MISC USE TO CHECK BLOOD SUGARS EVERY 14 DAYS. 2 each 0   diphenhydramine-acetaminophen (TYLENOL PM) 25-500 MG TABS tablet Take 2 tablets by mouth at bedtime.     flecainide (TAMBOCOR) 50 MG tablet TAKE 1 TABLET BY MOUTH TWICE DAILY, MAY TAKE AN ADDITIONAL 1 TABLET AS NEEDED. 270 tablet 2   furosemide (LASIX) 20 MG tablet Take 1 tablet (20 mg total) by mouth daily as needed. (Patient not taking: Reported on 11/06/2019) 60 tablet 0   insulin aspart (NOVOLOG) 100 UNIT/ML FlexPen Inject subcutaneously 3 times daily prior  to meals per sliding scale 15 mL 1   insulin glargine (LANTUS) 100 unit/mL SOPN Inject 0.1 mLs (10 Units total) into the skin daily. (Patient taking differently: Inject 15 Units into the skin at bedtime. ) 15 mL 2   Insulin Pen Needle (B-D UF III MINI PEN NEEDLES) 31G X 5 MM MISC Use with insulin pen 100 each 3   Lancets 30G MISC Check sugar twice daily. 100 each 3   lidocaine-prilocaine (EMLA) cream Apply 1 application topically as needed. Using a cotton ball , apply a small amount of cream to the skin over the  porta cath site. Do not rub in the cream. Cover with Plastic wrap. 30 g 0   lisinopril-hydrochlorothiazide (ZESTORETIC) 20-25 MG tablet Take 1 tablet by mouth once daily 90 tablet 0   LORazepam (ATIVAN) 0.5 MG tablet Place 1 tablet (0.5 mg total) under the tongue every 8 (eight) hours as needed (nausea). (Patient not taking: Reported on 11/06/2019) 30 tablet 0   magnesium oxide (MAG-OX) 400 (241.3 Mg) MG tablet Take 1 tablet (400 mg total) by mouth 3 (three) times daily. (Patient not taking: Reported on 11/06/2019) 90 tablet 0   metFORMIN (GLUCOPHAGE) 500 MG tablet TAKE 2 TABLETS BY MOUTH TWICE DAILY ( APPOINTMENT REQUIRED FOR FUTURE REFILLS) 120 tablet 0   methimazole (TAPAZOLE) 10 MG tablet Take 1 tablet (10 mg total) by mouth daily. 90 tablet 0   Omega-3 Fatty Acids (FISH OIL) 1200 MG CAPS Take 1,200 mg by mouth daily.  (Patient not taking: Reported on 11/06/2019)     potassium chloride SA (KLOR-CON) 20 MEQ tablet TAKE 1  BY MOUTH ONCE DAILY 90 tablet 0   simvastatin (ZOCOR) 10 MG tablet TAKE 1 TABLET BY MOUTH AT BEDTIME 90 tablet 1   Tetrahydrozoline HCl (VISINE OP) Place 1 drop into both eyes daily as needed (irritation).     No current facility-administered medications for this visit.    SURGICAL HISTORY:  Past Surgical History:  Procedure Laterality Date   ABLATION  03-04-2013   CTI by Dr Lovena Le for atrial flutter   ATRIAL FLUTTER ABLATION N/A 03/04/2013   Procedure: ATRIAL FLUTTER ABLATION;  Surgeon: Evans Lance, MD;  Location[facility] CATH LAB;  Service: Cardiovascular;  Laterality: N/A;   CHOLECYSTECTOMY  1982   COLONOSCOPY W/ POLYPECTOMY     x 2   ESOPHAGOGASTRODUODENOSCOPY N/A 05/20/2014   Procedure: ESOPHAGOGASTRODUODENOSCOPY (EGD);  Surgeon: Teena Irani, MD;  Location: [facility] ENDOSCOPY;  Service: Endoscopy;  Laterality: N/A;   IR IMAGING GUIDED PORT INSERTION  05/09/2019   TONSILLECTOMY  1972 ?   VIDEO BRONCHOSCOPY WITH ENDOBRONCHIAL ULTRASOUND Left 04/30/2019    Procedure: VIDEO BRONCHOSCOPY WITH ENDOBRONCHIAL ULTRASOUND;  Surgeon: Garner Nash, DO;  Location: [place];  Service: Pulmonary;  Laterality: Left;    REVIEW OF SYSTEMS:  A comprehensive review of systems was negative except for: Constitutional: positive for fatigue and weight loss   PHYSICAL EXAMINATION: General appearance: alert, cooperative, fatigued and no distress Head: Normocephalic, without obvious abnormality, atraumatic Neck: no adenopathy, no JVD, supple, symmetrical, trachea midline and thyroid not enlarged, symmetric, no tenderness/mass/nodules Lymph nodes: Cervical, supraclavicular, and axillary nodes normal. Resp: clear to auscultation bilaterally Back: symmetric, no curvature. ROM normal. No CVA tenderness. Cardio: regular rate and rhythm, S1, S2 normal, no murmur, click, rub or gallop GI: soft, non-tender; bowel sounds normal; no masses,  no organomegaly Extremities: extremities normal, atraumatic, no cyanosis or edema  ECOG PERFORMANCE STATUS: 1 - Symptomatic but completely ambulatory  Blood pressure (!) 154/90, pulse 72, temperature 98 F (36.7 C), temperature source Tympanic, resp. rate 20, height 5\' 4"  (1.626 m), weight 133 lb (60.3 kg), SpO2 100 %.  LABORATORY DATA: Lab Results  Component Value Date   WBC 4.6 11/05/2019   HGB 12.1 11/05/2019   HCT 34.2 (L) 11/05/2019   MCV 93.4 11/05/2019   PLT 133 (L) 11/05/2019      Chemistry      Component Value Date/Time   NA 140 11/05/2019 1001   NA 140 02/21/2018 0955   K 3.5 11/05/2019 1001   CL 107 11/05/2019 1001   CO2 26 11/05/2019 1001   BUN 18 11/05/2019 1001   BUN 14 02/21/2018 0955   CREATININE 0.86 11/05/2019 1001   CREATININE 0.82 01/22/2015 1556      Component Value Date/Time   CALCIUM 9.6 11/05/2019 1001   ALKPHOS 81 11/05/2019 1001   AST 12 (L) 11/05/2019 1001   ALT 8 11/05/2019 1001   BILITOT 0.5 11/05/2019 1001       RADIOGRAPHIC STUDIES: CT Chest W Contrast  Result Date:  11/20/2019 CLINICAL DATA:  Staging of small cell lung cancer EXAM: CT CHEST WITH CONTRAST TECHNIQUE: Multidetector CT imaging of the chest was performed during intravenous contrast administration. CONTRAST:  72mL OMNIPAQUE IOHEXOL 300 MG/ML  SOLN COMPARISON:  08/12/2019 FINDINGS: Cardiovascular: Calcified and noncalcified atheromatous plaque of the thoracic aorta. No sign of aneurysmal dilation. RIGHT-sided Port-A-Cath terminates in the upper RIGHT atrium. Central pulmonary vasculature unremarkable on venous phase assessment. Heart size normal with calcified coronary artery disease and without signs of pericardial effusion. Mediastinum/Nodes: Thoracic inlet structures are unremarkable. No axillary lymphadenopathy. No mediastinal lymphadenopathy. No hilar lymphadenopathy. Esophagus grossly normal. Lungs/Pleura: Mild paraseptal emphysematous changes at the lung apices also with mild centrilobular emphysema. Linear scarring in the lingula is unchanged. No sign of suspicious mass or nodule. Only ground-glass without discrete nodularity remains at the site of a central pulmonary nodule in the LEFT upper lobe. No effusion. Stable subtle juxtapleural nodularity in the superior segment of the RIGHT lower lobe. (Image 62, series 5) approximately 5-6 mm, potentially related to scarring. Subtle nodularity in the LEFT lower lobe seen on the prior study is no longer visible. Upper Abdomen: Adrenal glands are normal. Visualized portions of the kidneys with normal enhancement. Of upper abdominal viscera are unremarkable. No acute upper abdominal process on limited assessment. Musculoskeletal: No acute or destructive bone finding. IMPRESSION: 1. Only ground-glass without discrete nodularity remains at the site of a central pulmonary nodule in the LEFT upper lobe. Other areas of nodularity have likewise resolved. Minimal nodularity along the pleural surface in the posterior RIGHT chest is stable over a series of prior exams and may  represent scarring. 2. No new signs of disease. 3. Coronary artery and aortic atherosclerosis. 4. Emphysema and aortic atherosclerosis. Aortic Atherosclerosis (ICD10-I70.0) and Emphysema (ICD10-J43.9). Electronically Signed   By: Zetta Bills M.D.   On: 11/20/2019 08:38   MR Brain W Wo Contrast  Result Date: 11/20/2019 CLINICAL DATA:  Lung cancer with brain metastases.  Surveillance EXAM: MRI HEAD WITHOUT AND WITH CONTRAST TECHNIQUE: Multiplanar, multiecho pulse sequences of the brain and surrounding structures were obtained without and with intravenous contrast. CONTRAST:  6mL GADAVIST GADOBUTROL 1 MMOL/ML IV SOLN COMPARISON:  09/04/2019 FINDINGS: BRAIN New Lesions: None. Larger lesions: None. Stable or Smaller lesions: Multiple brain lesions consistent with metastatic disease. A few of these have resolved since prior. Remaining lesions are all subcentimeter in size in either  nodular or ring like enhancement. All reference numbers are from series 20: Upper right cerebellum, image 46 lateral left cerebellum on image 30 Inferior left temporal lobe on image 37 Lateral right frontal lobe on image 80 Left mid frontal cortex on image 96 Anterior right frontal cortex on image 108 High right frontal parietal cortex on image 111. This is the largest lesion at 5 mm Precentral gyrus on the right on image 121 Frontoparietal junction on the right at 124 High left frontal cortex on 128. Other Brain findings: No brain swelling, infarct, hydrocephalus, or collection. Vascular: Normal flow voids and vascular enhancements Skull and upper cervical spine: Normal marrow signal Sinuses/Orbits: Negative.  Nasopharyngeal proteinaceous cysts. IMPRESSION: Brain metastases are all stable to regressed in size. No new disease. Electronically Signed   By: Monte Fantasia M.D.   On: 11/20/2019 08:48    ASSESSMENT AND PLAN: This is a very pleasant 64 years old white female with metastatic small cell lung cancer that was initially  diagnosed as limited stage small cell lung cancer and currently undergoing systemic chemotherapy with cisplatin and etoposide concurrent with radiation status post 4 cycles. The patient also received whole brain irradiation under the care of Dr. Sondra Come. She had repeat MRI of the brain that showed significant improvement in her disease in the brain. The patient also had CT scan of the chest that showed no evidence for disease progression. I recommended for her to continue on observation with repeat CT scan of the chest, abdomen and pelvis in 3 months. For the incidentally diagnosed pulmonary embolism of the left lower lobe pulmonary arteries, she will continue her current treatment with Xarelto 20 mg p.o. daily. The patient was advised to call immediately if she has any concerning symptoms in the interval. The patient voices understanding of current disease status and treatment options and is in agreement with the current care plan. All questions were answered. The patient knows to call the clinic with any problems, questions or concerns. We can certainly see the patient much sooner if necessary.   Disclaimer: This note was dictated with voice recognition software. Similar sounding words can inadvertently be transcribed and may not be corrected upon review.

## 2019-11-26 NOTE — Patient Instructions (Signed)
Steps to Quit Smoking Smoking tobacco is the leading cause of preventable death. It can affect almost every organ in the body. Smoking puts you and people around you at risk for many serious, long-lasting (chronic) diseases. Quitting smoking can be hard, but it is one of the best things that you can do for your health. It is never too late to quit. How do I get ready to quit? When you decide to quit smoking, make a plan to help you succeed. Before you quit:  Pick a date to quit. Set a date within the next 2 weeks to give you time to prepare.  Write down the reasons why you are quitting. Keep this list in places where you will see it often.  Tell your family, friends, and co-workers that you are quitting. Their support is important.  Talk with your doctor about the choices that may help you quit.  Find out if your health insurance will pay for these treatments.  Know the people, places, things, and activities that make you want to smoke (triggers). Avoid them. What first steps can I take to quit smoking?  Throw away all cigarettes at home, at work, and in your car.  Throw away the things that you use when you smoke, such as ashtrays and lighters.  Clean your car. Make sure to empty the ashtray.  Clean your home, including curtains and carpets. What can I do to help me quit smoking? Talk with your doctor about taking medicines and seeing a counselor at the same time. You are more likely to succeed when you do both.  If you are pregnant or breastfeeding, talk with your doctor about counseling or other ways to quit smoking. Do not take medicine to help you quit smoking unless your doctor tells you to do so. To quit smoking: Quit right away  Quit smoking totally, instead of slowly cutting back on how much you smoke over a period of time.  Go to counseling. You are more likely to quit if you go to counseling sessions regularly. Take medicine You may take medicines to help you quit. Some  medicines need a prescription, and some you can buy over-the-counter. Some medicines may contain a drug called nicotine to replace the nicotine in cigarettes. Medicines may:  Help you to stop having the desire to smoke (cravings).  Help to stop the problems that come when you stop smoking (withdrawal symptoms). Your doctor may ask you to use:  Nicotine patches, gum, or lozenges.  Nicotine inhalers or sprays.  Non-nicotine medicine that is taken by mouth. Find resources Find resources and other ways to help you quit smoking and remain smoke-free after you quit. These resources are most helpful when you use them often. They include:  Online chats with a counselor.  Phone quitlines.  Printed self-help materials.  Support groups or group counseling.  Text messaging programs.  Mobile phone apps. Use apps on your mobile phone or tablet that can help you stick to your quit plan. There are many free apps for mobile phones and tablets as well as websites. Examples include Quit Guide from the CDC and smokefree.gov  What things can I do to make it easier to quit?   Talk to your family and friends. Ask them to support and encourage you.  Call a phone quitline (1-800-QUIT-NOW), reach out to support groups, or work with a counselor.  Ask people who smoke to not smoke around you.  Avoid places that make you want to smoke,   such as: ? Bars. ? Parties. ? Smoke-break areas at work.  Spend time with people who do not smoke.  Lower the stress in your life. Stress can make you want to smoke. Try these things to help your stress: ? Getting regular exercise. ? Doing deep-breathing exercises. ? Doing yoga. ? Meditating. ? Doing a body scan. To do this, close your eyes, focus on one area of your body at a time from head to toe. Notice which parts of your body are tense. Try to relax the muscles in those areas. How will I feel when I quit smoking? Day 1 to 3 weeks Within the first 24 hours,  you may start to have some problems that come from quitting tobacco. These problems are very bad 2-3 days after you quit, but they do not often last for more than 2-3 weeks. You may get these symptoms:  Mood swings.  Feeling restless, nervous, angry, or annoyed.  Trouble concentrating.  Dizziness.  Strong desire for high-sugar foods and nicotine.  Weight gain.  Trouble pooping (constipation).  Feeling like you may vomit (nausea).  Coughing or a sore throat.  Changes in how the medicines that you take for other issues work in your body.  Depression.  Trouble sleeping (insomnia). Week 3 and afterward After the first 2-3 weeks of quitting, you may start to notice more positive results, such as:  Better sense of smell and taste.  Less coughing and sore throat.  Slower heart rate.  Lower blood pressure.  Clearer skin.  Better breathing.  Fewer sick days. Quitting smoking can be hard. Do not give up if you fail the first time. Some people need to try a few times before they succeed. Do your best to stick to your quit plan, and talk with your doctor if you have any questions or concerns. Summary  Smoking tobacco is the leading cause of preventable death. Quitting smoking can be hard, but it is one of the best things that you can do for your health.  When you decide to quit smoking, make a plan to help you succeed.  Quit smoking right away, not slowly over a period of time.  When you start quitting, seek help from your doctor, family, or friends. This information is not intended to replace advice given to you by your health care provider. Make sure you discuss any questions you have with your health care provider. Document Revised: 11/22/2018 Document Reviewed: 05/18/2018 Elsevier Patient Education  2020 [employer].  

## 2019-11-27 ENCOUNTER — Telehealth: Payer: Self-pay | Admitting: Internal Medicine

## 2019-11-27 NOTE — Telephone Encounter (Signed)
Scheduled per los. Called, not able to leave msg. Mailed printout  

## 2019-12-01 ENCOUNTER — Other Ambulatory Visit: Payer: 59

## 2019-12-01 ENCOUNTER — Inpatient Hospital Stay: Payer: 59

## 2019-12-02 ENCOUNTER — Other Ambulatory Visit (INDEPENDENT_AMBULATORY_CARE_PROVIDER_SITE_OTHER): Payer: 59

## 2019-12-02 ENCOUNTER — Other Ambulatory Visit: Payer: Self-pay

## 2019-12-02 DIAGNOSIS — Z794 Long term (current) use of insulin: Secondary | ICD-10-CM

## 2019-12-02 DIAGNOSIS — E1165 Type 2 diabetes mellitus with hyperglycemia: Secondary | ICD-10-CM | POA: Diagnosis not present

## 2019-12-02 DIAGNOSIS — E0590 Thyrotoxicosis, unspecified without thyrotoxic crisis or storm: Secondary | ICD-10-CM | POA: Diagnosis not present

## 2019-12-02 LAB — COMPREHENSIVE METABOLIC PANEL
ALT: 9 U/L (ref 0–35)
AST: 14 U/L (ref 0–37)
Albumin: 4.4 g/dL (ref 3.5–5.2)
Alkaline Phosphatase: 81 U/L (ref 39–117)
BUN: 19 mg/dL (ref 6–23)
CO2: 30 mEq/L (ref 19–32)
Calcium: 9.8 mg/dL (ref 8.4–10.5)
Chloride: 102 mEq/L (ref 96–112)
Creatinine, Ser: 0.83 mg/dL (ref 0.40–1.20)
GFR: 69.13 mL/min (ref >60.00)
Glucose, Bld: 104 mg/dL — ABNORMAL HIGH (ref 70–99)
Potassium: 3.3 mEq/L — ABNORMAL LOW (ref 3.5–5.1)
Sodium: 140 mEq/L (ref 135–145)
Total Bilirubin: 0.9 mg/dL (ref 0.2–1.2)
Total Protein: 7.2 g/dL (ref 6.0–8.3)

## 2019-12-02 LAB — TSH: TSH: 0.73 u[IU]/mL (ref 0.35–4.50)

## 2019-12-02 LAB — T4, FREE: Free T4: 0.94 ng/dL (ref 0.60–1.60)

## 2019-12-03 LAB — THYROTROPIN RECEPTOR AUTOABS: Thyrotropin Receptor Ab: 3.44 IU/L — ABNORMAL HIGH (ref 0.00–1.75)

## 2019-12-03 LAB — FRUCTOSAMINE: Fructosamine: 247 umol/L (ref 0–285)

## 2019-12-04 ENCOUNTER — Encounter: Payer: Self-pay | Admitting: Internal Medicine

## 2019-12-04 ENCOUNTER — Other Ambulatory Visit: Payer: Self-pay

## 2019-12-04 ENCOUNTER — Encounter: Payer: Self-pay | Admitting: Endocrinology

## 2019-12-04 ENCOUNTER — Ambulatory Visit (INDEPENDENT_AMBULATORY_CARE_PROVIDER_SITE_OTHER): Payer: 59 | Admitting: Endocrinology

## 2019-12-04 VITALS — BP 120/84 | HR 86 | Ht 64.0 in | Wt 129.0 lb

## 2019-12-04 DIAGNOSIS — E1165 Type 2 diabetes mellitus with hyperglycemia: Secondary | ICD-10-CM | POA: Diagnosis not present

## 2019-12-04 DIAGNOSIS — E0590 Thyrotoxicosis, unspecified without thyrotoxic crisis or storm: Secondary | ICD-10-CM

## 2019-12-04 DIAGNOSIS — Z794 Long term (current) use of insulin: Secondary | ICD-10-CM | POA: Diagnosis not present

## 2019-12-04 NOTE — Patient Instructions (Addendum)
Tapazole 10mg  1/2 daily at  [place] 1mg  at dinner, must take before the meal  Stop am Metformin  Restart Potassium

## 2019-12-04 NOTE — Progress Notes (Signed)
[name], female   DOB: October 15, 1955, 64 y.o.   MRN: [id number]                                                                                                               Reason for Appointment:  Hyperthyroidism and diabetes, follow-up  Referring healthcare provider: [name], NP   Chief complaint: Follow-up   History of Present Illness:   HYPOTHYROIDISM: It is unclear what symptoms the patient had at initial diagnosis but she had a low TSH in 2014 She was referred for endocrinology evaluation but she did not do so because of the cost  Subsequently in 02/2018 she was started on methimazole, at that time she was apparently still not having any symptoms  including palpitations, shakiness, feeling excessively warm and sweaty, weight loss, and fatigue.  Baseline free T4 and T3 levels were normal but her thyrotropin receptor antibody was high She does not think she felt any different with starting methimazole  She was referred for endocrinology consultation and seen in 4/21 She has been prescribed methimazole 15 mg daily since 1/21 when her free T4 was mildly increased She did not feel any different with starting the treatment   She has been told to take methimazole 10mg  daily However again she cannot remember to take this regularly and she thinks she is taking this only 3 to 4 days a week; this is because she thought she was supposed to take it in the morning and she forgets  Thyroid levels are in the normal range and TSH is still consistently normal  However thyrotropin receptor antibody is 3.44, higher than  in April  Wt Readings from Last 3 Encounters:  12/04/19 129 lb (58.5 kg)  11/26/19 133 lb (60.3 kg)  11/24/19 131 lb 9.6 oz (59.7 kg)    Thyroid function tests as follows:     Lab Results  Component Value Date   FREET4 0.94 12/02/2019   FREET4 0.89 09/16/2019   FREET4 0.90 08/04/2019   T3FREE 2.9 06/23/2019   T3FREE 1.6 (L) 03/22/2019    T3FREE 3.5 02/25/2018   TSH 0.73 12/02/2019   TSH 1.10 09/16/2019   TSH 1.51 08/04/2019     Lab Results  Component Value Date   THYROTRECAB 3.44 (H) 12/02/2019   THYROTRECAB 2.72 (H) 06/23/2019   THYROTRECAB 41.3 (H) 03/08/2018   DIABETES management:  Date of diagnosis of type 2 diabetes mellitus:   2015?      Background history:  She has been on Metformin since diagnosis when her diabetes was relatively mild Subsequently Amaryl added After 2018 her blood sugars appear to be progressively worsening with increasing A1c She thinks that sometimes she would forget to take her medications in the morning and blood sugars would be higher for this reason She was admitted for severe hypoglycemia with her sepsis and recommended to be on insulin in 03/2019  Recent history:    INSULIN regimen is: Lantus 15 units at night, NovoLog 6 units in evening  Her A1c  in 7/21 was 6.4  Non-insulin hypoglycemic drugs the patient is taking are: Metformin 0.5 g twice daily  Current management, blood sugar patterns and problems identified:  With her not getting any Decadron for quite some time she has had much better blood sugars  She said that she is starting to get relatively low sugars with using her freestyle libre  Because of relatively low sugar she is only taking Lantus now about once a week  Despite this she is still having relatively low sugars overnight  Her freestyle libre sensor is fairly close to the lab reading on the same date and time  She forgets to take her NovoLog at dinnertime and usually has high readings after dinner, averaging about 172  Mostly eating 1 meal a day now  Her weight is recently stable  She continues to take Metformin, but only 500 mg twice daily        Side effects from medications have been: None  Typical meal intake: Breakfast is usually eggs and toast, recently may not eat anything Mostly eating 1 meal in the evening        Exercise:   None  Glucose monitoring:  As below    Blood Glucose readings  from freestyle libre, data only between 9/16 and 9/23 available:  CGM use % of time  55  2-week average/SD  112  Time in range     83   %  % Time Above 180  10  % Time above 250   % Time Below 70  7     PRE-MEAL Fasting Lunch Dinner Bedtime Overall  Glucose range:       Averages:  79  89  112   112   POST-MEAL PC Breakfast PC Lunch PC Dinner  Glucose range:     Averages:  89   172      Previously reviewed data:   CGM use % of time  81  Average and SD  169, GV 36  Time in range    59    %  % Time Above 180  26  % Time above 250  13  % Time Below target  2    PRE-MEAL Fasting Lunch Dinner Bedtime Overall  Glucose range:       Mean/median:  116  157  179   169   POST-MEAL PC Breakfast PC Lunch PC Dinner  Glucose range:     Mean/median:  128  188  228    Glycemic patterns summary: HIGHEST blood sugars are late at night between about 12-2 AM and starting to rise after 10 PM. Blood sugars are near normal early morning but variably high between 2-6 PM    Dietician visit, most recent: Never  Weight history: Wt Readings from Last 3 Encounters:  12/04/19 129 lb (58.5 kg)  11/26/19 133 lb (60.3 kg)  11/24/19 131 lb 9.6 oz (59.7 kg)   Lab Results  Component Value Date   HGBA1C 6.4 09/16/2019   HGBA1C 10.4 (A) 06/23/2019   HGBA1C 13.9 (H) 03/23/2019   Lab Results  Component Value Date   MICROALBUR <0.7 05/10/2015   LDLCALC 50 10/29/2019   CREATININE 0.83 12/02/2019     Allergies as of 12/04/2019      Reactions   Jardiance [empagliflozin] Palpitations   Causes heart palpitations   Trazodone And Nefazodone Other (See Comments)   "hyped me up, could not sleep"      Medication List  Accurate as of December 04, 2019 11:59 PM. If you have any questions, ask your nurse or doctor.        albuterol 108 (90 Base) MCG/ACT inhaler Commonly known as: VENTOLIN HFA Inhale 2 puffs into the  lungs every 6 (six) hours as needed for wheezing or shortness of breath.   B-D UF III MINI PEN NEEDLES 31G X 5 MM Misc Generic drug: Insulin Pen Needle Use with insulin pen   CALTRATE 600+D3 PO Take 1 tablet by mouth daily.   carvedilol 12.5 MG tablet Commonly known as: COREG Take 1 tablet (12.5 mg total) by mouth in the morning and at bedtime.   diphenhydramine-acetaminophen 25-500 MG Tabs tablet Commonly known as: TYLENOL PM Take 2 tablets by mouth at bedtime.   Fish Oil 1200 MG Caps Take 1,200 mg by mouth daily.   flecainide 50 MG tablet Commonly known as: TAMBOCOR TAKE 1 TABLET BY MOUTH TWICE DAILY, MAY TAKE AN ADDITIONAL 1 TABLET AS NEEDED.   FreeStyle Libre 2 Sensor Misc USE TO CHECK BLOOD SUGARS EVERY 14 DAYS.   furosemide 20 MG tablet Commonly known as: LASIX Take 1 tablet (20 mg total) by mouth daily as needed.   insulin aspart 100 UNIT/ML FlexPen Commonly known as: NOVOLOG Inject subcutaneously 3 times daily prior to meals per sliding scale   insulin glargine 100 unit/mL Sopn Commonly known as: LANTUS Inject 0.1 mLs (10 Units total) into the skin daily. What changed:   how much to take  when to take this   Lancets 30G Misc Check sugar twice daily.   lidocaine-prilocaine cream Commonly known as: EMLA Apply 1 application topically as needed. Using a cotton ball , apply a small amount of cream to the skin over the porta cath site. Do not rub in the cream. Cover with Plastic wrap.   lisinopril-hydrochlorothiazide 20-25 MG tablet Commonly known as: ZESTORETIC Take 1 tablet by mouth once daily   LORazepam 0.5 MG tablet Commonly known as: Ativan Place 1 tablet (0.5 mg total) under the tongue every 8 (eight) hours as needed (nausea).   magnesium oxide 400 (241.3 Mg) MG tablet Commonly known as: MAG-OX Take 1 tablet (400 mg total) by mouth 3 (three) times daily.   metFORMIN 500 MG tablet Commonly known as: GLUCOPHAGE TAKE 2 TABLETS BY MOUTH TWICE  DAILY ( APPOINTMENT REQUIRED FOR FUTURE REFILLS) What changed: See the new instructions.   methimazole 10 MG tablet Commonly known as: TAPAZOLE Take 1 tablet (10 mg total) by mouth daily.   potassium chloride SA 20 MEQ tablet Commonly known as: KLOR-CON TAKE 1  BY MOUTH ONCE DAILY   simvastatin 10 MG tablet Commonly known as: ZOCOR TAKE 1 TABLET BY MOUTH AT BEDTIME   VISINE OP Place 1 drop into both eyes daily as needed (irritation).           Past Medical History:  Diagnosis Date  . Arthritis   . Atrial flutter [facility])    s/p CTI by Dr Lovena Le 02/2013  . Chest pain   . Diabetes mellitus without complication ([place])   . Dysrhythmia    hx of Aflutter  . 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 Hemochromatosis associated with compound heterozygous mutation in HFE gene ([place]) 12/10/2017  . Hyperlipidemia   . Hypertension   . Hyperthyroidism 10/31/2014  . lung ca dx'd 03/2019    Past Surgical History:  Procedure Laterality Date  . ABLATION  03-04-2013   CTI by Dr Lovena Le for  atrial flutter  . ATRIAL FLUTTER ABLATION N/A 03/04/2013   Procedure: ATRIAL FLUTTER ABLATION;  Surgeon: Evans Lance, MD;  Location: [facility] C[facility] LAB;  Service: Cardiovascular;  Laterality: N/A;  . CHOLECYSTECTOMY  1982  . COLONOSCOPY W/ POLYPECTOMY     x 2  . ESOPHAGOGASTRODUODENOSCOPY N/A 05/20/2014   Procedure: ESOPHAGOGASTRODUODENOSCOPY (EGD);  Surgeon: Teena Irani, MD;  Location: [facility];  Service: Endoscopy;  Laterality: N/A;  . IR IMAGING GUIDED PORT INSERTION  05/09/2019  . TONSILLECTOMY  1972 ?  Marland Kitchen VIDEO BRONCHOSCOPY WITH ENDOBRONCHIAL ULTRASOUND Left 04/30/2019   Procedure: VIDEO BRONCHOSCOPY WITH ENDOBRONCHIAL ULTRASOUND;  Surgeon: Garner Nash, DO;  Location: [place];  Service: Pulmonary;  Laterality: Left;    Family History  Problem Relation Age of Onset  . Atrial fibrillation Mother   . Arrhythmia Mother   . Diabetes Mother   . Thyroid disease Mother   . Hodgkin's  lymphoma Father   . Thyroid disease Daughter        Graves disease  . Diabetes Daughter   . Anxiety disorder Neg Hx     Social History:  reports that she quit smoking about 8 months ago. Her smoking use included cigarettes. She smoked 1.00 pack per day. She has never used smokeless tobacco. She reports that she does not drink alcohol and does not use drugs.  Allergies:  Allergies  Allergen Reactions  . Jardiance [Empagliflozin] Palpitations    Causes heart palpitations  . Trazodone And Nefazodone Other (See Comments)    "hyped me up, could not sleep"     Review of Systems  Hypertension has been treated with Zestoretic However she has not taken her potassium supplement  Lab Results  Component Value Date   K 3.3 (L) 12/02/2019     Currently on 10 mg simvastatin for hyperlipidemia  Lab Results  Component Value Date   CHOL 121 10/29/2019   CHOL 80 (L) 07/10/2019   CHOL 149 02/21/2018   Lab Results  Component Value Date   HDL 42 10/29/2019   HDL 25 (L) 07/10/2019   HDL 40 02/21/2018   Lab Results  Component Value Date   LDLCALC 50 10/29/2019   LDLCALC 21 07/10/2019   LDLCALC 46 02/21/2018   Lab Results  Component Value Date   TRIG 177 (H) 10/29/2019   TRIG 222 (H) 07/10/2019   TRIG 315 (H) 02/21/2018   Lab Results  Component Value Date   CHOLHDL 2.9 10/29/2019   CHOLHDL 3.7 02/21/2018   CHOLHDL 4 09/04/2016   No results found for: LDLDIRECT    Examination:   BP 120/84   Pulse 86   Ht 5\' 4"  (1.626 m)   Wt 129 lb (58.5 kg)   SpO2 99%   BMI 22.14 kg/m   Thyroid not palpable   Assessment/Plan:   Hyperthyroidism, from Graves' disease   She has had longstanding subclinical hyperthyroidism for many years presenting mostly as a low TSH  She has been on treatment with 10 mg methimazole although she is taking this very regularly and about half the time Has been she has no symptoms of hyper or hypothyroidism  She is reluctant to do the I-131  treatment which was recommended because of her lack of remission on long-term treatment with methimazole Also has high thyrotropin receptor antibody    She will take her methimazole in the evening when she is taking her other medication but reduce the tablet to half of the 10 mg daily  Follow-up in 2  months   DIABETES type II on insulin:  See history of present illness for detailed discussion of current diabetes management, blood sugar patterns and problems identified  She has been on basal bolus insulin and Metformin  Her A1c has come down to 6.4 compared to 10.4  Blood sugars are much lower with her being on steroids for her chemotherapy She is not getting hypoglycemic even with taking Lantus only once a week However most of her high readings are after dinner when she is usually late in taking her NovoLog insulin Also on Metformin but taking only 1000 mg a day  DIABETES RECOMMENDATIONS:  She will continue using the freestyle libre again and use it regularly as long as she can afford it  Stop all insulin PRANDIN 1 mg before dinner, explained how this works, timing of taking the medication and adjustment based on meal size and postprandial reading She will try to make sure she takes it before starting to eat and take 2 tablets if eating larger meals or if the blood sugars are going up over 180 after eating She can reduce her Metformin to only 1 tablet in the evening Encouraged her to start regular walking  For hypokalemia she needs to restart her potassium and take it with her other medications in the evening  Patient Instructions  Tapazole 10mg  1/2 daily at  [place] 1mg  at dinner, must take before the meal  Stop am Metformin  Restart Potassium    Elayne Snare 12/05/2019, 8:49 AM    Note: This office note was prepared with Dragon voice recognition system technology. Any transcriptional errors that result from this process are unintentional.

## 2019-12-05 ENCOUNTER — Other Ambulatory Visit: Payer: Self-pay | Admitting: Endocrinology

## 2019-12-05 MED ORDER — REPAGLINIDE 1 MG PO TABS: 1.0000 mg | ORAL_TABLET | Freq: Two times a day (BID) | ORAL | 1 refills | Status: AC

## 2019-12-05 MED ORDER — METFORMIN HCL 500 MG PO TABS
ORAL_TABLET | ORAL | 1 refills | Status: DC
Start: 2019-12-05 — End: 2020-06-09

## 2019-12-08 ENCOUNTER — Other Ambulatory Visit: Payer: Self-pay | Admitting: Radiation Oncology

## 2019-12-08 DIAGNOSIS — C7931 Secondary malignant neoplasm of brain: Secondary | ICD-10-CM

## 2019-12-12 ENCOUNTER — Emergency Department (HOSPITAL_COMMUNITY): Payer: 59

## 2019-12-12 ENCOUNTER — Other Ambulatory Visit: Payer: Self-pay

## 2019-12-12 ENCOUNTER — Encounter (HOSPITAL_COMMUNITY): Payer: Self-pay | Admitting: Emergency Medicine

## 2019-12-12 ENCOUNTER — Emergency Department (HOSPITAL_COMMUNITY)
Admission: EM | Admit: 2019-12-12 | Discharge: 2019-12-12 | Disposition: A | Discharge status: Home / Self Care | Payer: 59 | Attending: Emergency Medicine | Admitting: Emergency Medicine

## 2019-12-12 DIAGNOSIS — Z85118 Personal history of other malignant neoplasm of bronchus and lung: Secondary | ICD-10-CM | POA: Insufficient documentation

## 2019-12-12 DIAGNOSIS — R55 Syncope and collapse: Secondary | ICD-10-CM | POA: Insufficient documentation

## 2019-12-12 DIAGNOSIS — R0781 Pleurodynia: Secondary | ICD-10-CM | POA: Insufficient documentation

## 2019-12-12 DIAGNOSIS — I10 Essential (primary) hypertension: Secondary | ICD-10-CM | POA: Diagnosis not present

## 2019-12-12 DIAGNOSIS — Z7984 Long term (current) use of oral hypoglycemic drugs: Secondary | ICD-10-CM | POA: Insufficient documentation

## 2019-12-12 DIAGNOSIS — E1110 Type 2 diabetes mellitus with ketoacidosis without coma: Secondary | ICD-10-CM | POA: Insufficient documentation

## 2019-12-12 DIAGNOSIS — Z79899 Other long term (current) drug therapy: Secondary | ICD-10-CM | POA: Insufficient documentation

## 2019-12-12 DIAGNOSIS — I959 Hypotension, unspecified: Secondary | ICD-10-CM | POA: Diagnosis not present

## 2019-12-12 DIAGNOSIS — Z794 Long term (current) use of insulin: Secondary | ICD-10-CM | POA: Diagnosis not present

## 2019-12-12 DIAGNOSIS — E785 Hyperlipidemia, unspecified: Secondary | ICD-10-CM | POA: Insufficient documentation

## 2019-12-12 DIAGNOSIS — E876 Hypokalemia: Secondary | ICD-10-CM

## 2019-12-12 DIAGNOSIS — Z9221 Personal history of antineoplastic chemotherapy: Secondary | ICD-10-CM | POA: Insufficient documentation

## 2019-12-12 DIAGNOSIS — E1169 Type 2 diabetes mellitus with other specified complication: Secondary | ICD-10-CM | POA: Insufficient documentation

## 2019-12-12 DIAGNOSIS — R000 Tachycardia, unspecified: Secondary | ICD-10-CM | POA: Insufficient documentation

## 2019-12-12 DIAGNOSIS — E860 Dehydration: Secondary | ICD-10-CM | POA: Diagnosis not present

## 2019-12-12 DIAGNOSIS — Z85020 Personal history of malignant carcinoid tumor of stomach: Secondary | ICD-10-CM | POA: Insufficient documentation

## 2019-12-12 DIAGNOSIS — Z87891 Personal history of nicotine dependence: Secondary | ICD-10-CM | POA: Insufficient documentation

## 2019-12-12 LAB — CBC WITH DIFFERENTIAL/PLATELET
Abs Immature Granulocytes: 0.02 10*3/uL (ref 0.00–0.07)
Basophils Absolute: 0 10*3/uL (ref 0.0–0.1)
Basophils Relative: 0 %
Eosinophils Absolute: 0.1 10*3/uL (ref 0.0–0.5)
Eosinophils Relative: 1 %
HCT: 36.8 % (ref 36.0–46.0)
Hemoglobin: 12.8 g/dL (ref 12.0–15.0)
Immature Granulocytes: 0 %
Lymphocytes Relative: 20 %
Lymphs Abs: 1.4 10*3/uL (ref 0.7–4.0)
MCH: 32.5 pg (ref 26.0–34.0)
MCHC: 34.8 g/dL (ref 30.0–36.0)
MCV: 93.4 fL (ref 80.0–100.0)
Monocytes Absolute: 0.6 10*3/uL (ref 0.1–1.0)
Monocytes Relative: 8 %
Neutro Abs: 5.1 10*3/uL (ref 1.7–7.7)
Neutrophils Relative %: 71 %
Platelets: 160 10*3/uL (ref 150–400)
RBC: 3.94 MIL/uL (ref 3.87–5.11)
RDW: 13.2 % (ref 11.5–15.5)
WBC: 7.1 10*3/uL (ref 4.0–10.5)
nRBC: 0 % (ref 0.0–0.2)

## 2019-12-12 LAB — COMPREHENSIVE METABOLIC PANEL
ALT: 13 U/L (ref 0–44)
AST: 15 U/L (ref 15–41)
Albumin: 4 g/dL (ref 3.5–5.0)
Alkaline Phosphatase: 77 U/L (ref 38–126)
Anion gap: 13 (ref 5–15)
BUN: 30 mg/dL — ABNORMAL HIGH (ref 8–23)
CO2: 24 mmol/L (ref 22–32)
Calcium: 9.8 mg/dL (ref 8.9–10.3)
Chloride: 95 mmol/L — ABNORMAL LOW (ref 98–111)
Creatinine, Ser: 0.85 mg/dL (ref 0.44–1.00)
GFR calc Af Amer: >60 mL/min (ref >60)
GFR calc non Af Amer: >60 mL/min (ref >60)
Glucose, Bld: 242 mg/dL — ABNORMAL HIGH (ref 70–99)
Potassium: 2.9 mmol/L — ABNORMAL LOW (ref 3.5–5.1)
Sodium: 132 mmol/L — ABNORMAL LOW (ref 135–145)
Total Bilirubin: 1.7 mg/dL — ABNORMAL HIGH (ref 0.3–1.2)
Total Protein: 7.5 g/dL (ref 6.5–8.1)

## 2019-12-12 LAB — D-DIMER, QUANTITATIVE: D-Dimer, Quant: 4.11 ug/mL-FEU — ABNORMAL HIGH (ref 0.00–0.50)

## 2019-12-12 LAB — LACTIC ACID, PLASMA: Lactic Acid, Venous: 0.9 mmol/L (ref 0.5–1.9)

## 2019-12-12 LAB — TROPONIN I (HIGH SENSITIVITY): Troponin I (High Sensitivity): 5 ng/L (ref <18)

## 2019-12-12 IMAGING — DX DG CHEST 1V PORT
1 series · 1 of 1 positions shown · non-contrast
Comparison: [DATE]

CLINICAL DATA: Near syncope.

EXAM:
PORTABLE CHEST 1 VIEW

[chest ap]
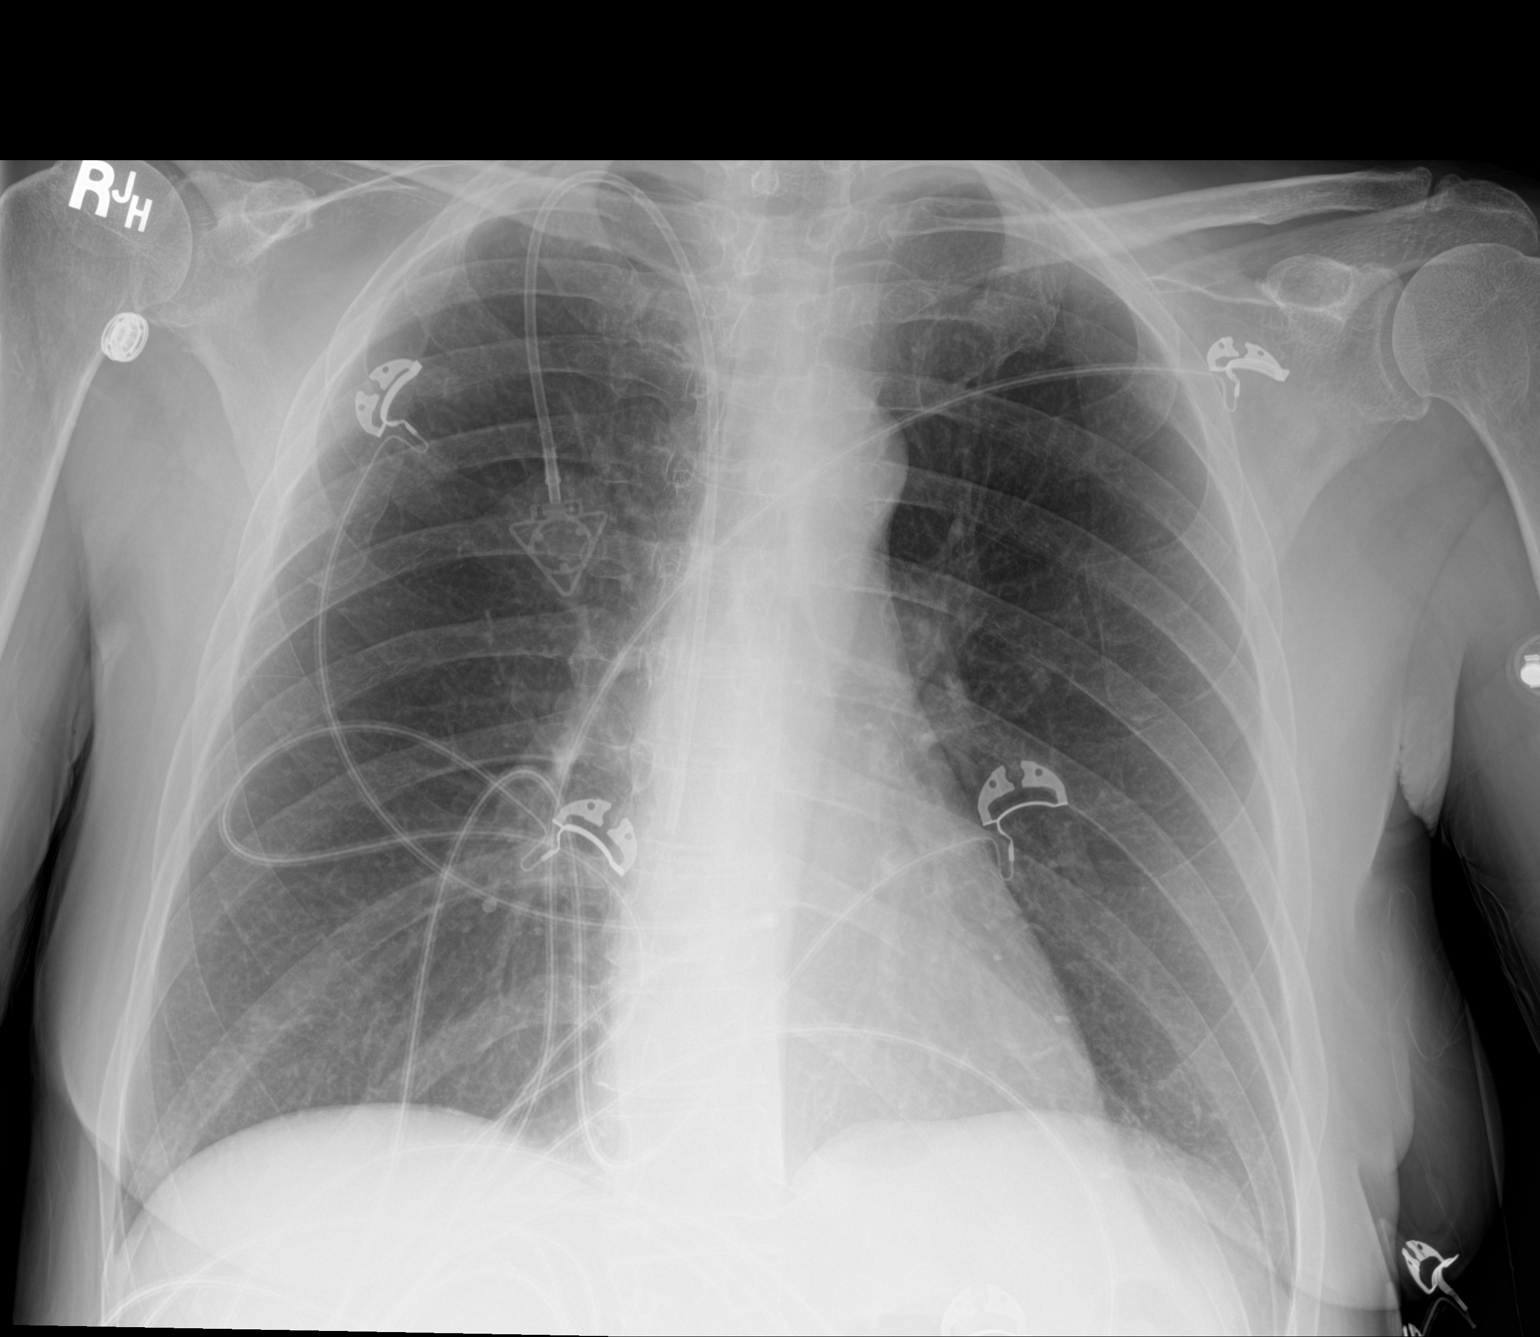

[1 of 1 positions shown; findings below may reference images not displayed]

FINDINGS: A right-sided venous Port-A-Cath is seen with its distal tip noted
at the junction of the superior vena cava and right atrium. The
heart size and mediastinal contours are within normal limits. Both
lungs are clear. The visualized skeletal structures are
unremarkable.
IMPRESSION: No active disease.

## 2019-12-12 IMAGING — CT CT ANGIO CHEST
2 of 6 series · 18 of 36 positions shown · IV contrast (omnipaque)
Comparison: Most recent chest CT [DATE].

CLINICAL DATA: PE suspected, low/intermediate prob, positive
D-dimer

Weakness and near syncope.  History of lung cancer.
EXAM:
CT ANGIOGRAPHY CHEST WITH CONTRAST
TECHNIQUE: Multidetector CT imaging of the chest was performed using the
standard protocol during bolus administration of intravenous
contrast. Multiplanar CT image reconstructions and MIPs were
obtained to evaluate the vascular anatomy.
CONTRAST:  60mL OMNIPAQUE IOHEXOL 350 MG/ML SOLN

[Series 5: thins · axial · 0.70mm/px · z∈[+1343,+1620]mm · 17 of 313 slices shown]
[im 18/313  lung]
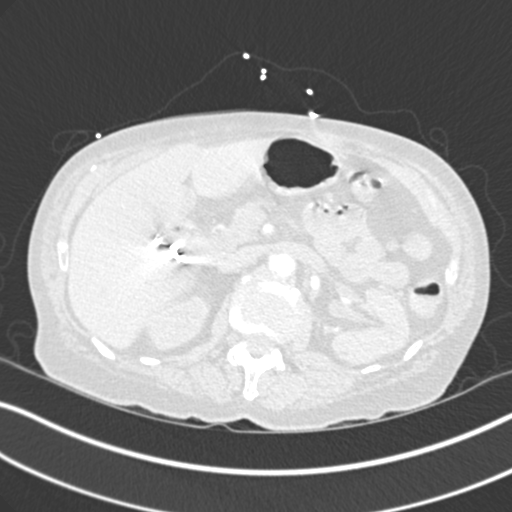
[im 35/313  mediastinal]
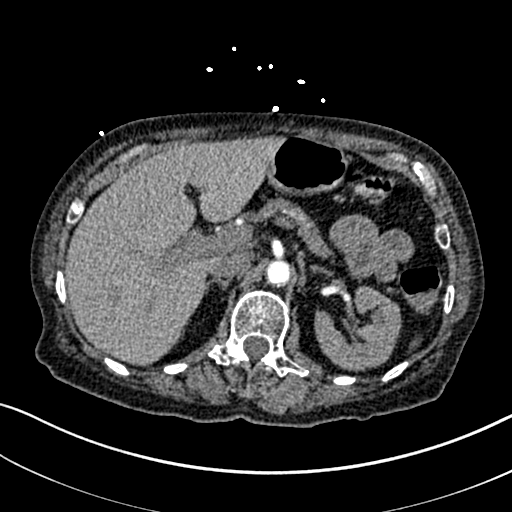
[im 53/313  lung]
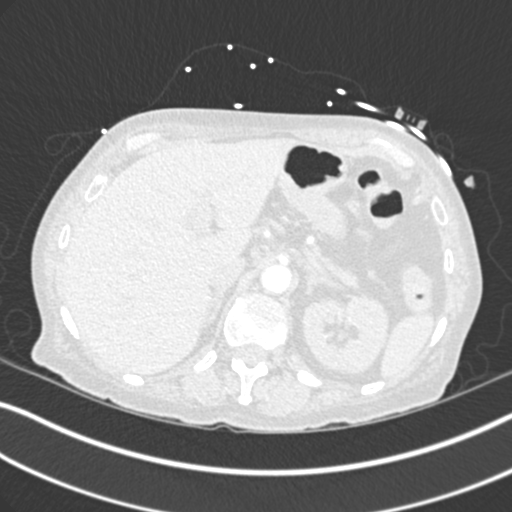
[im 70/313  mediastinal]
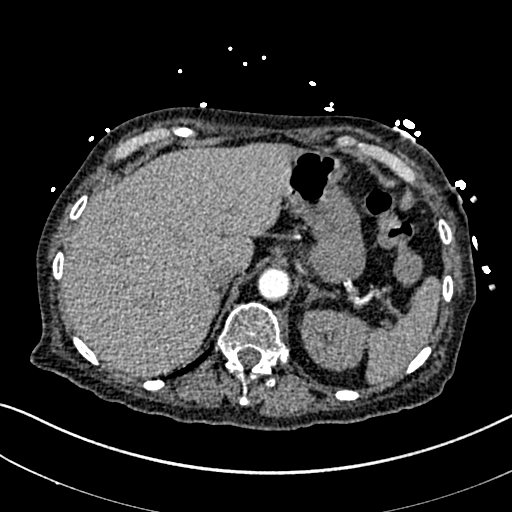
[im 87/313  lung]
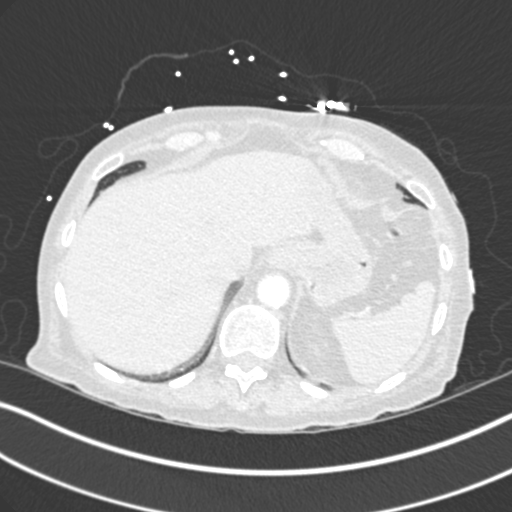
[im 105/313  mediastinal]
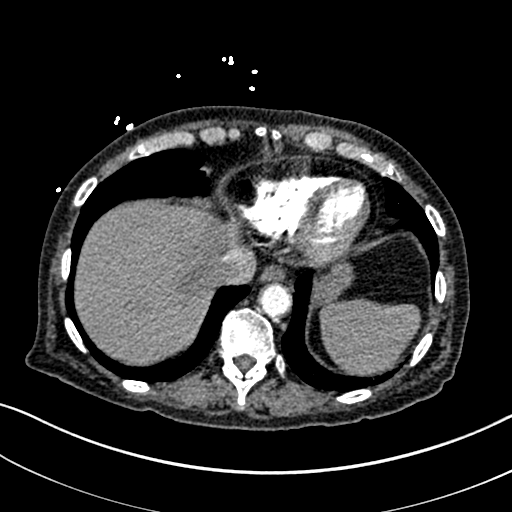
[im 122/313  lung]
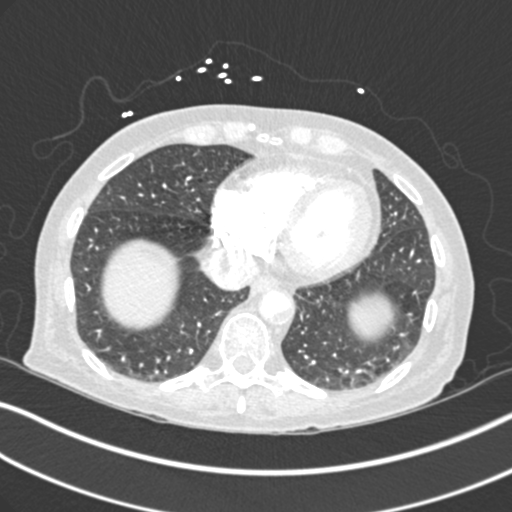
[im 139/313  mediastinal]
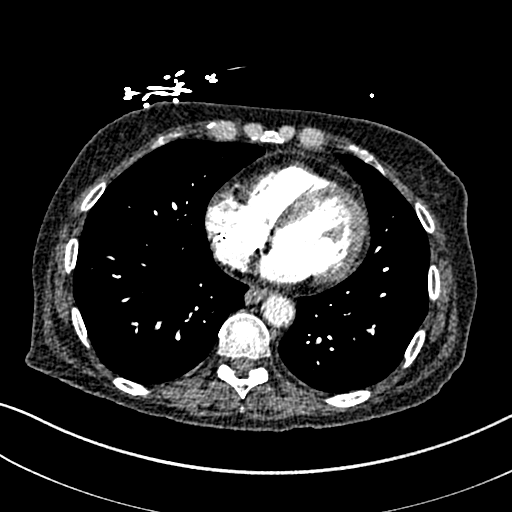
[im 157/313  lung]
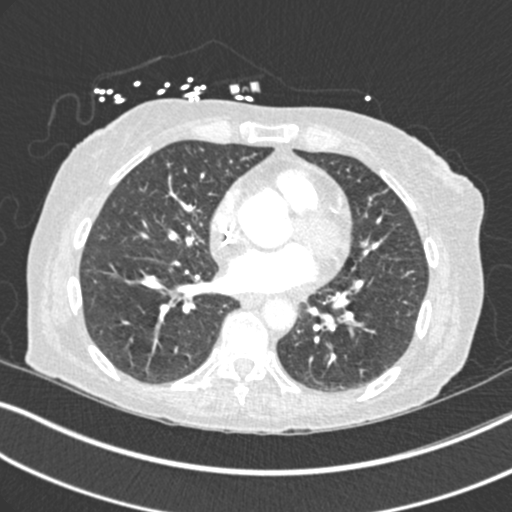
[im 174/313  mediastinal]
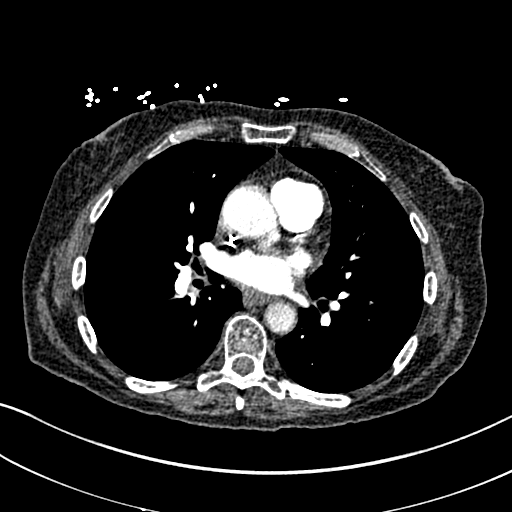
[im 191/313  lung]
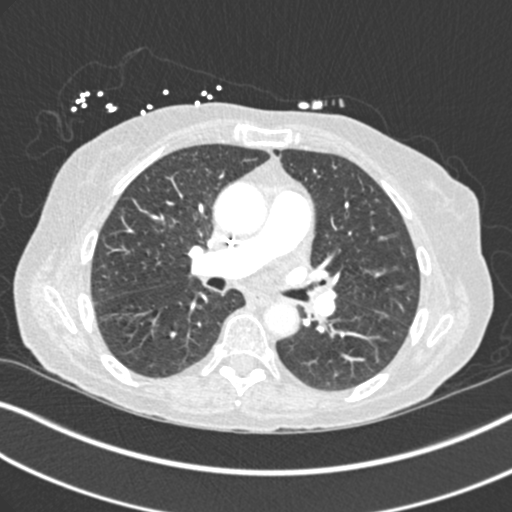
[im 209/313  mediastinal]
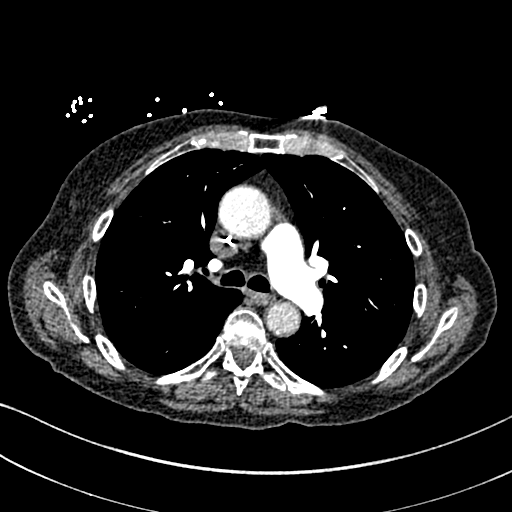
[im 226/313  lung]
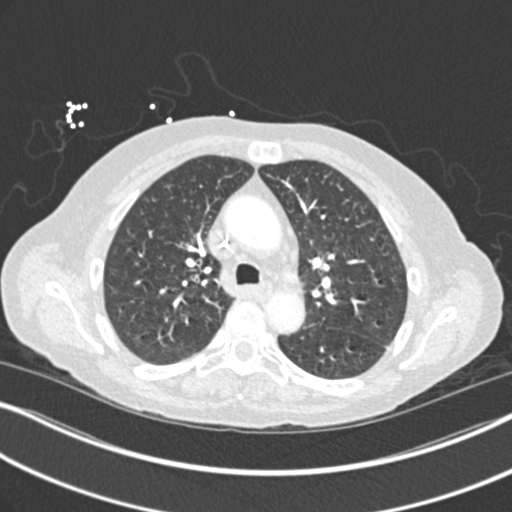
[im 243/313  mediastinal]
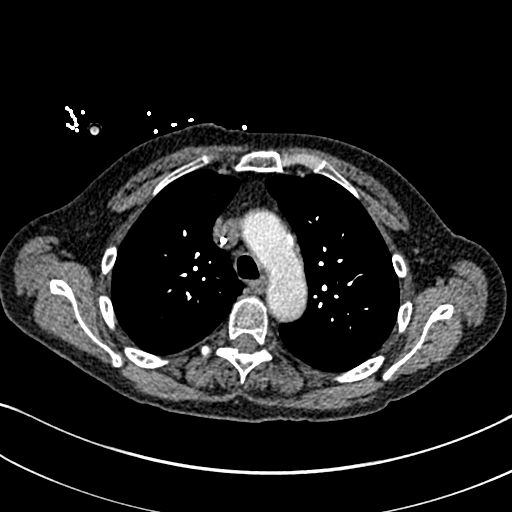
[im 261/313  lung]
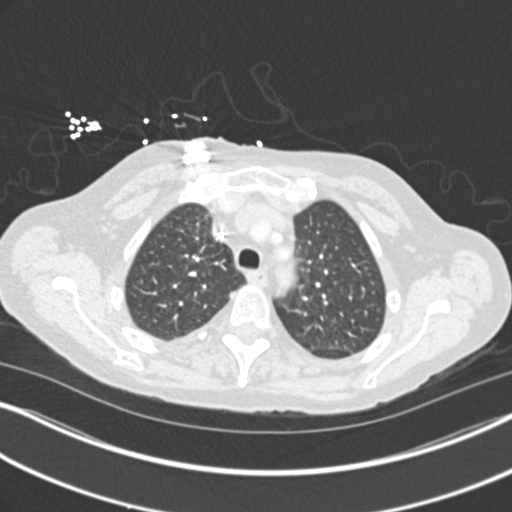
[im 278/313  mediastinal]
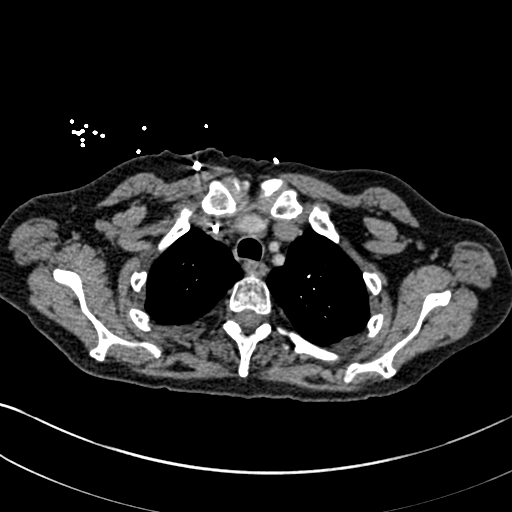
[im 295/313  lung]
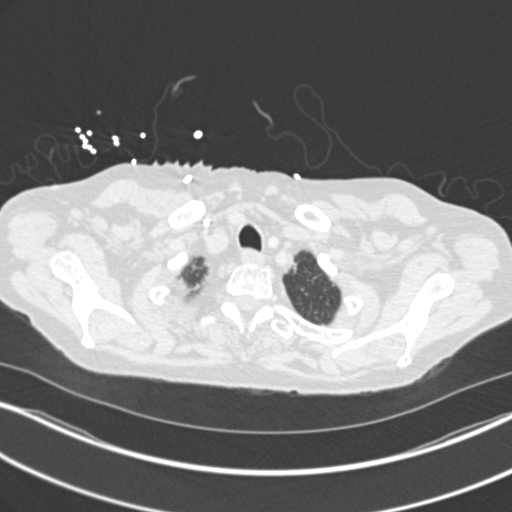

[Series 7: coronal mpr · coronal · 0.61mm/px · 1 of 122 slices shown]
[im 61/122  mediastinal]
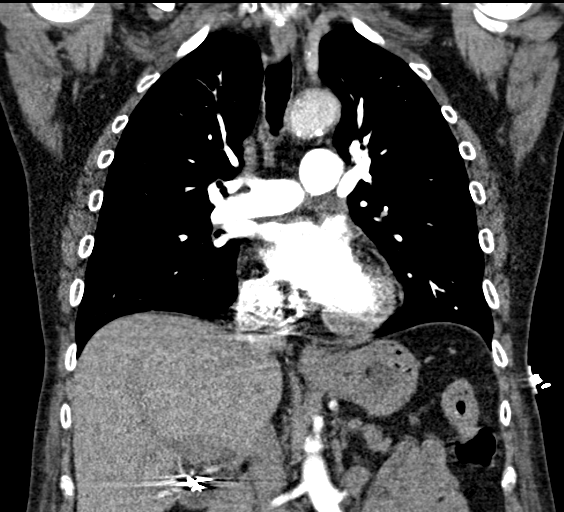

[18 of 36 positions shown; findings below may reference images not displayed]

FINDINGS: Cardiovascular: There are no filling defects within the pulmonary
arteries to suggest pulmonary embolus. Right chest port with tip in
the atrial caval junction. Aortic atherosclerosis with calcified and
noncalcified atheromatous plaque. No evidence of dissection or acute
aortic findings. Heart is normal in size. There is no pericardial
effusion. Occasional coronary artery calcifications.

Mediastinum/Nodes: No enlarged mediastinal or hilar lymph nodes. No
axillary or supraclavicular adenopathy. No thyroid nodule. No
esophageal wall thickening.

Lungs/Pleura: Mild emphysema. Mild central bronchial thickening.
Decreased pleural based nodularity in the superior segment of the
right lower lobe from prior exam, series 6, image 56. This is
currently 3 mm, previously 5-6 mm. Previously described ground-glass
in the left upper lobe is diminished from prior. No acute airspace
disease. No pleural fluid. No new or progressive pulmonary nodule.

Upper Abdomen: Cholecystectomy.  No acute finding.

Musculoskeletal: There are no acute or suspicious osseous
abnormalities. Punctate sclerotic density in the midthoracic spine
is unchanged, likely bone island.

Review of the MIP images confirms the above findings.
IMPRESSION: 1. No pulmonary embolus or acute intrathoracic abnormality.
2. Decreased size of pleural based nodularity in the superior
segment of the right lower lobe from prior exam. Additional
ground-glass opacity in the left upper lobe has diminished from
prior.
3. Emphysema with mild central bronchial thickening.

Aortic Atherosclerosis ([ZO]-[ZO]) and Emphysema ([ZO]-[ZO]).

## 2019-12-12 MED ORDER — POTASSIUM CHLORIDE 10 MEQ/100ML IV SOLN
10.0000 meq | Freq: Once | INTRAVENOUS | Status: DC
  Filled 2019-12-12: qty 100

## 2019-12-12 MED ORDER — IOHEXOL 350 MG/ML SOLN
100.0000 mL | Freq: Once | INTRAVENOUS | Status: AC | PRN
  Administered 2019-12-12: 60 mL via INTRAVENOUS

## 2019-12-12 MED ORDER — POTASSIUM CHLORIDE CRYS ER 20 MEQ PO TBCR
20.0000 meq | EXTENDED_RELEASE_TABLET | Freq: Once | ORAL | Status: AC
  Administered 2019-12-12: 20 meq via ORAL
  Filled 2019-12-12: qty 1

## 2019-12-12 MED ORDER — SODIUM CHLORIDE (PF) 0.9 % IJ SOLN
INTRAMUSCULAR | Status: AC
  Filled 2019-12-12: qty 50

## 2019-12-12 MED ORDER — HEPARIN SOD (PORK) LOCK FLUSH 100 UNIT/ML IV SOLN
500.0000 [IU] | Freq: Once | INTRAVENOUS | Status: AC
  Administered 2019-12-12: 500 [IU]
  Filled 2019-12-12: qty 5

## 2019-12-12 MED ORDER — LACTATED RINGERS IV BOLUS
1000.0000 mL | Freq: Once | INTRAVENOUS | Status: AC
  Administered 2019-12-12: 1000 mL via INTRAVENOUS

## 2019-12-12 NOTE — ED Provider Notes (Signed)
Patient care transferred to me. Patient feels well now that she's had fluids. HR normal and in sinus rhythm. BP is ok. She walked to the bathroom and feels well enough for discharge. She did not yet get her potassium IV and declines it, wants to go home. Will give PO. She endorses not taking her potassium, and will restart at home. D/c   Sherwood Gambler, MD 12/12/19 [id number]

## 2019-12-12 NOTE — ED Provider Notes (Signed)
[facility] [facility] DEPT Provider Note   CSN: [id number] Arrival date & time: 12/12/19  1900     History Chief Complaint  Christy Abbott presents with  . Hypotension  . Tachycardia    Christy Abbott is a 64 y.o. female.  Christy Abbott is a 64 year old female with a history of lung cancer with metastases to the brain who has finished chemotherapy and radiation, diabetes, atrial flutter on flecainide and carvedilol, hypertension and prior PE who presents today with rapid heart rate, low blood pressure and near syncope at home.  Christy Abbott's husband states that for the last few days she had been complaining of some pleuritic type chest pain that occasionally radiated into her jaw and shoulder and intermittent episodes of feeling lightheaded.  Christy Abbott reports that she felt pretty good when she went to bed but this morning when she woke up she was just not feeling well.  She was tired and has stayed in bed.  When she should try to get up to go to the bathroom she almost collapsed.  She also reports for the last 1 week she had forgotten to take her flecainide and carvedilol and took 1 dose this afternoon which seemed to make symptoms worse.  She also reports she has not been eating or drinking much and is concerned she may be dehydrated.  She denies any fevers, cough, abdominal pain, nausea or vomiting.  She has not had diarrhea.  She does not take any anticoagulation at this time.  The history is provided by the Christy Abbott, the spouse and medical records.       Past Medical History:  Diagnosis Date  . Arthritis   . Atrial flutter [facility])    s/p CTI by Dr Lovena Le 02/2013  . Chest pain   . Diabetes mellitus without complication ([place])   . Dysrhythmia    hx of Aflutter  . 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 Hemochromatosis associated with compound heterozygous mutation in HFE gene ([place]) 12/10/2017  . Hyperlipidemia   . Hypertension   . Hyperthyroidism 10/31/2014  .  lung ca dx'd 03/2019    Christy Abbott Active Problem List   Diagnosis Date Noted  . Primary malignant neoplasm of lung with metastasis to brain ([place]) 09/10/2019  . Pulmonary embolism on left ([place]) 06/25/2019  . Chemotherapy induced neutropenia ([place]) 05/28/2019  . Primary lung small cell carcinoma, left ([place]) 05/08/2019  . Encounter for antineoplastic chemotherapy 05/08/2019  . Goals of care, counseling/discussion 05/08/2019  . Elevated ferritin 04/30/2019  . Acute on chronic respiratory failure with hypoxemia ([place]) 04/09/2019  . Abnormal CT of the chest 04/09/2019  . DKA, type 2 ([place]) 03/22/2019  . SVT (supraventricular tachycardia) ([place]) 03/22/2019  . Viral illness 03/22/2019  . Sepsis ([place]) 03/22/2019  . Small cell lung cancer ([place]) 03/22/2019  . Hemochromatosis associated with compound heterozygous mutation in HFE gene ([place]) 12/10/2017  . Stress at home 04/10/2016  . Carpal tunnel syndrome of left wrist 04/10/2016  . Insomnia 10/18/2015  . H/O malignant neuroendocrine tumor 10/31/2014  . Hyperthyroidism 10/31/2014  . GERD (gastroesophageal reflux disease) 07/28/2014  . HTN (hypertension) 07/28/2014  . 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 Hypomagnesemia 05/29/2014  . Hypokalemia 05/20/2014  . Tobacco use disorder 12/03/2013  . Diabetes type 2, controlled ([place]) 04/21/2013  . Atrial flutter ([place]) 02/24/2013  . Essential hypertension, benign 02/24/2013    Past Surgical History:  Procedure Laterality Date  . ABLATION  03-04-2013   CTI by Dr  Lovena Le for atrial flutter  . ATRIAL FLUTTER ABLATION N/A 03/04/2013   Procedure: ATRIAL FLUTTER ABLATION;  Surgeon: Evans Lance, MD;  Location: [facility] CATH LAB;  Service: Cardiovascular;  Laterality: N/A;  . CHOLECYSTECTOMY  1982  . COLONOSCOPY W/ POLYPECTOMY     x 2  . ESOPHAGOGASTRODUODENOSCOPY N/A 05/20/2014   Procedure: ESOPHAGOGASTRODUODENOSCOPY (EGD);  Surgeon: Teena Irani, MD;  Location: [facility] ENDOSCOPY;  Service: Endoscopy;  Laterality:  N/A;  . IR IMAGING GUIDED PORT INSERTION  05/09/2019  . TONSILLECTOMY  1972 ?  Marland Kitchen VIDEO BRONCHOSCOPY WITH ENDOBRONCHIAL ULTRASOUND Left 04/30/2019   Procedure: VIDEO BRONCHOSCOPY WITH ENDOBRONCHIAL ULTRASOUND;  Surgeon: Garner Nash, DO;  Location: [facility];  Service: Pulmonary;  Laterality: Left;     OB History   No obstetric history on file.     Family History  Problem Relation Age of Onset  . Atrial fibrillation Mother   . Arrhythmia Mother   . Diabetes Mother   . Thyroid disease Mother   . Hodgkin's lymphoma Father   . Thyroid disease Daughter        Graves disease  . Diabetes Daughter   . Anxiety disorder Neg Hx     Social History   Tobacco Use  . Smoking status: Former Smoker    Packs/day: 1.00    Types: Cigarettes    Quit date: 03/21/2019    Years since quitting: 0.7  . Smokeless tobacco: Never Used  . Tobacco comment: started age 37 , quit for a year many times  Vaping Use  . Vaping Use: Never used  Substance Use Topics  . Alcohol use: No    Alcohol/week: 0.0 standard drinks  . Drug use: No    Home Medications Prior to Admission medications   Medication Sig Start Date End Date Taking? Authorizing Provider  albuterol (VENTOLIN HFA) 108 (90 Base) MCG/ACT inhaler Inhale 2 puffs into the lungs every 6 (six) hours as needed for wheezing or shortness of breath. 03/28/19   Aline August, MD  Calcium Carb-Cholecalciferol (CALTRATE 600+D3 PO) Take 1 tablet by mouth daily.    [provider]  carvedilol (COREG) 12.5 MG tablet Take 1 tablet (12.5 mg total) by mouth in the morning and at bedtime. 05/05/19   Jettie Booze, MD  Continuous Blood Gluc Sensor (FREESTYLE LIBRE 2 SENSOR) MISC USE TO CHECK BLOOD SUGARS EVERY 14 DAYS. 11/26/19   Elayne Snare, MD  diphenhydramine-acetaminophen (TYLENOL PM) 25-500 MG TABS tablet Take 2 tablets by mouth at bedtime.    [provider]  flecainide (TAMBOCOR) 50 MG tablet TAKE 1 TABLET BY MOUTH TWICE DAILY, MAY TAKE  AN ADDITIONAL 1 TABLET AS NEEDED. 09/03/19   Jettie Booze, MD  furosemide (LASIX) 20 MG tablet Take 1 tablet (20 mg total) by mouth daily as needed. 04/08/19   Imogene Burn, PA-C  insulin aspart (NOVOLOG) 100 UNIT/ML FlexPen Inject subcutaneously 3 times daily prior to meals per sliding scale 05/19/19   [name], NP  insulin glargine (LANTUS) 100 unit/mL SOPN Inject 0.1 mLs (10 Units total) into the skin daily. Christy Abbott taking differently: Inject 15 Units into the skin at bedtime.  04/01/19   [name], NP  Insulin Pen Needle (B-D UF III MINI PEN NEEDLES) 31G X 5 MM MISC Use with insulin pen 04/01/19   [name], NP  Lancets 30G MISC Check sugar twice daily. 06/09/18   [name], NP  lidocaine-prilocaine (EMLA) cream Apply 1 application topically as needed. Using a cotton ball ,  apply a small amount of cream to the skin over the porta cath site. Do not rub in the cream. Cover with Plastic wrap. 05/13/19   Curt Bears, MD  lisinopril-hydrochlorothiazide (ZESTORETIC) 20-25 MG tablet Take 1 tablet by mouth once daily 10/22/19   [name], NP  LORazepam (ATIVAN) 0.5 MG tablet Place 1 tablet (0.5 mg total) under the tongue every 8 (eight) hours as needed (nausea). 07/02/19   Tanner, Lyndon Code., PA-C  magnesium oxide (MAG-OX) 400 (241.3 Mg) MG tablet Take 1 tablet (400 mg total) by mouth 3 (three) times daily. 07/19/19   Curt Bears, MD  metFORMIN (GLUCOPHAGE) 500 MG tablet Take 1 tab at dinnertime 12/05/19   Elayne Snare, MD  methimazole (TAPAZOLE) 10 MG tablet Take 1 tablet (10 mg total) by mouth daily. 06/24/19   Elayne Snare, MD  Omega-3 Fatty Acids (FISH OIL) 1200 MG CAPS Take 1,200 mg by mouth daily.     [provider]  potassium chloride SA (KLOR-CON) 20 MEQ tablet TAKE 1  BY MOUTH ONCE DAILY 07/11/19   [name], NP  repaglinide (PRANDIN) 1 MG tablet Take 1 tablet (1 mg total) by mouth 2 (two) times daily before a meal. 12/05/19    Elayne Snare, MD  simvastatin (ZOCOR) 10 MG tablet TAKE 1 TABLET BY MOUTH AT BEDTIME 06/14/19   [name], NP  Tetrahydrozoline HCl (VISINE OP) Place 1 drop into both eyes daily as needed (irritation).    [provider]  metFORMIN (GLUCOPHAGE) 500 MG tablet Take 2 tablets (1,000 mg total) by mouth 2 (two) times daily. 07/26/18   [name], NP  simvastatin (ZOCOR) 10 MG tablet TAKE 1 TABLET BY MOUTH AT BEDTIME Christy Abbott taking differently: Take 10 mg by mouth at bedtime.  03/05/19   [name], NP    Allergies    Jardiance [empagliflozin] and Trazodone and nefazodone  Review of Systems   Review of Systems  All other systems reviewed and are negative.   Physical Exam Updated Vital Signs BP (!) 77/61 (BP Location: Right Arm)   Pulse (!) 131   Temp 97.6 F (36.4 C) (Oral)   Resp 18   SpO2 100%   Physical Exam Vitals and nursing note reviewed.  Constitutional:      General: She is not in acute distress.    Appearance: She is well-developed and underweight.  HENT:     Head: Normocephalic and atraumatic.     Mouth/Throat:     Mouth: Mucous membranes are dry.  Eyes:     Pupils: Pupils are equal, round, and reactive to light.  Cardiovascular:     Rate and Rhythm: Regular rhythm. Tachycardia present.     Heart sounds: Normal heart sounds. No murmur heard.  No friction rub.  Pulmonary:     Effort: Pulmonary effort is normal.     Breath sounds: Normal breath sounds. No wheezing or rales.  Abdominal:     General: Bowel sounds are normal. There is no distension.     Palpations: Abdomen is soft.     Tenderness: There is no abdominal tenderness. There is no guarding or rebound.  Musculoskeletal:        General: No tenderness. Normal range of motion.     Cervical back: Normal range of motion and neck supple.     Right lower leg: No edema.     Left lower leg: No edema.     Comments: No edema  Skin:    General: Skin is warm and  dry.     Capillary  Refill: Capillary refill takes 2 to 3 seconds.     Findings: No rash.  Neurological:     General: No focal deficit present.     Mental Status: She is alert and oriented to person, place, and time. Mental status is at baseline.     Cranial Nerves: No cranial nerve deficit.  Psychiatric:        Mood and Affect: Mood normal.        Behavior: Behavior normal.        Thought Content: Thought content normal.     ED Results / Procedures / Treatments   Labs (all labs ordered are listed, but only abnormal results are displayed) Labs Reviewed  COMPREHENSIVE METABOLIC PANEL - Abnormal; Notable for the following components:      Result Value   Sodium 132 (*)    Potassium 2.9 (*)    Chloride 95 (*)    Glucose, Bld 242 (*)    BUN 30 (*)    Total Bilirubin 1.7 (*)    All other components within normal limits  D-DIMER, QUANTITATIVE (NOT AT [facility]) - Abnormal; Notable for the following components:   D-Dimer, Quant 4.11 (*)    All other components within normal limits  CBC WITH DIFFERENTIAL/PLATELET  LACTIC ACID, PLASMA  TROPONIN I (HIGH SENSITIVITY)  TROPONIN I (HIGH SENSITIVITY)    EKG EKG Interpretation  Date/Time:  Friday December 12 2019 19:26:28 EDT Ventricular Rate:  133 PR Interval:    QRS Duration: 118 QT Interval:  387 QTC Calculation: 576 R Axis:   75 Text Interpretation: Sinus tachycardia Nonspecific intraventricular conduction delay ST elevation, consider inferior injury Baseline wander in lead(s) V2 12 Lead; [place] No significant change since last tracing Confirmed by Blanchie Dessert [phone]) on 12/12/2019 8:00:36 PM   Radiology CT Angio Chest PE W and/or Wo Contrast  Result Date: 12/12/2019 CLINICAL DATA:  PE suspected, low/intermediate prob, positive D-dimer Weakness and near syncope.  History of lung cancer. EXAM: CT ANGIOGRAPHY CHEST WITH CONTRAST TECHNIQUE: Multidetector CT imaging of the chest was performed using the standard protocol during bolus administration  of intravenous contrast. Multiplanar CT image reconstructions and MIPs were obtained to evaluate the vascular anatomy. CONTRAST:  79mL OMNIPAQUE IOHEXOL 350 MG/ML SOLN COMPARISON:  Most recent chest CT 11/20/2019. FINDINGS: Cardiovascular: There are no filling defects within the pulmonary arteries to suggest pulmonary embolus. Right chest port with tip in the atrial caval junction. Aortic atherosclerosis with calcified and noncalcified atheromatous plaque. No evidence of dissection or acute aortic findings. Heart is normal in size. There is no pericardial effusion. Occasional coronary artery calcifications. Mediastinum/Nodes: No enlarged mediastinal or hilar lymph nodes. No axillary or supraclavicular adenopathy. No thyroid nodule. No esophageal wall thickening. Lungs/Pleura: Mild emphysema. Mild central bronchial thickening. Decreased pleural based nodularity in the superior segment of the right lower lobe from prior exam, series 6, image 56. This is currently 3 mm, previously 5-6 mm. Previously described ground-glass in the left upper lobe is diminished from prior. No acute airspace disease. No pleural fluid. No new or progressive pulmonary nodule. Upper Abdomen: Cholecystectomy.  No acute finding. Musculoskeletal: There are no acute or suspicious osseous abnormalities. Punctate sclerotic density in the midthoracic spine is unchanged, likely bone island. Review of the MIP images confirms the above findings. IMPRESSION: 1. No pulmonary embolus or acute intrathoracic abnormality. 2. Decreased size of pleural based nodularity in the superior segment of the right lower lobe from prior exam. Additional ground-glass  opacity in the left upper lobe has diminished from prior. 3. Emphysema with mild central bronchial thickening. Aortic Atherosclerosis (ICD10-I70.0) and Emphysema (ICD10-J43.9). Electronically Signed   By: Keith Rake M.D.   On: 12/12/2019 22:57   DG Chest Port 1 View  Result Date:  12/12/2019 CLINICAL DATA:  Near syncope. EXAM: PORTABLE CHEST 1 VIEW COMPARISON:  April 09, 2019 FINDINGS: A right-sided venous Port-A-Cath is seen with its distal tip noted at the junction of the superior vena cava and right atrium. The heart size and mediastinal contours are within normal limits. Both lungs are clear. The visualized skeletal structures are unremarkable. IMPRESSION: No active disease. Electronically Signed   By: Virgina Norfolk M.D.   On: 12/12/2019 20:29    Procedures Procedures (including critical care time)  Medications Ordered in ED Medications  lactated ringers bolus 1,000 mL (has no administration in time range)    ED Course  I have reviewed the triage vital signs and the nursing notes.  Pertinent labs & imaging results that were available during my care of the Christy Abbott were reviewed by me and considered in my medical decision making (see chart for details).    MDM Rules/Calculators/A&P                          64 year old female presenting today with complaints of intermittent pleuritic chest pain which is currently not present, tachycardia and hypotension with near syncope.  Symptoms could be a result of dehydration as Christy Abbott has had poor oral intake versus infection versus PE versus dysrhythmia.  Christy Abbott EKG here shows sinus tachycardia at a rate of 133 with some mild ST elevation.  However Christy Abbott's rate is going fast and has a remain baseline and will need to be repeated.  Christy Abbott at this time is chest pain-free.  She did forget to take flecainide and carvedilol for the last 1 week but took a dose this afternoon.  Does not appear to be in atrial flutter at this time.  No symptoms to suggest dissection.  However we will do a D-dimer, labs and give a fluid bolus.  Chest x-ray is pending.  Labs reassuring except for hypokalemia at 2.9 and elevated d-dimer. On re-evaluation after fluids now pt HR in the 80's and BP improved.  Will get CTA of chest to r/o  PE.  Final Clinical Impression(s) / ED Diagnoses Final diagnoses:  None    Rx / DC Orders ED Discharge Orders    None       Blanchie Dessert, MD 12/13/19 1513

## 2019-12-12 NOTE — ED Triage Notes (Signed)
Patient c/o weakness and near syncopal episode when ambulating today. Denies N/V/D. Hx lung cancer.

## 2019-12-13 ENCOUNTER — Telehealth: Payer: Self-pay | Admitting: Family

## 2019-12-13 LAB — TROPONIN I (HIGH SENSITIVITY): Troponin I (High Sensitivity): 7 ng/L (ref <18)

## 2019-12-13 NOTE — Telephone Encounter (Signed)
Please schedule pt a follow up appointment with me in about 1 week for ER follow up.

## 2019-12-15 ENCOUNTER — Encounter: Payer: Self-pay | Admitting: Internal Medicine

## 2019-12-17 ENCOUNTER — Inpatient Hospital Stay: Payer: 59

## 2019-12-23 ENCOUNTER — Other Ambulatory Visit: Payer: Self-pay

## 2019-12-23 ENCOUNTER — Telehealth: Payer: Self-pay | Admitting: Family

## 2019-12-23 ENCOUNTER — Ambulatory Visit (INDEPENDENT_AMBULATORY_CARE_PROVIDER_SITE_OTHER): Payer: 59 | Admitting: Family

## 2019-12-23 VITALS — BP 115/67 | HR 75 | Temp 98.1°F | Resp 16 | Ht 64.0 in | Wt 125.0 lb

## 2019-12-23 DIAGNOSIS — E0590 Thyrotoxicosis, unspecified without thyrotoxic crisis or storm: Secondary | ICD-10-CM

## 2019-12-23 DIAGNOSIS — E876 Hypokalemia: Secondary | ICD-10-CM | POA: Diagnosis not present

## 2019-12-23 DIAGNOSIS — C3490 Malignant neoplasm of unspecified part of unspecified bronchus or lung: Secondary | ICD-10-CM | POA: Diagnosis not present

## 2019-12-23 DIAGNOSIS — I2699 Other pulmonary embolism without acute cor pulmonale: Secondary | ICD-10-CM

## 2019-12-23 DIAGNOSIS — Z0000 Encounter for general adult medical examination without abnormal findings: Secondary | ICD-10-CM

## 2019-12-23 DIAGNOSIS — Z794 Long term (current) use of insulin: Secondary | ICD-10-CM

## 2019-12-23 DIAGNOSIS — E1165 Type 2 diabetes mellitus with hyperglycemia: Secondary | ICD-10-CM

## 2019-12-23 DIAGNOSIS — E785 Hyperlipidemia, unspecified: Secondary | ICD-10-CM

## 2019-12-23 DIAGNOSIS — I10 Essential (primary) hypertension: Secondary | ICD-10-CM | POA: Diagnosis not present

## 2019-12-23 NOTE — Progress Notes (Signed)
Subjective:    [name], female    DOB: Aug 07, 1955, 64 y.o.   MRN: [id number]  HPI  Patient had near syncope on 10/1- found to be dehydrated. Reports that her AM pills sometimes will cause her to vomit.    DM2- on metformin 500mg  bid Lab Results  Component Value Date   HGBA1C 6.4 09/16/2019   HGBA1C 10.4 (A) 06/23/2019   HGBA1C 13.9 (H) 03/23/2019   Lab Results  Component Value Date   MICROALBUR <0.7 05/10/2015   LDLCALC 50 10/29/2019   CREATININE 0.85 12/12/2019   Hyperthyroid-  Lab Results  Component Value Date   TSH 0.73 12/02/2019   Hyperlipidemia-  Lab Results  Component Value Date   CHOL 121 10/29/2019   HDL 42 10/29/2019   LDLCALC 50 10/29/2019   TRIG 177 (H) 10/29/2019   CHOLHDL 2.9 10/29/2019   Small cell lung cancer-she was diagnosed February 2021.  Brain metastasis was diagnosed June 2021.  Her current treatment plan includes systemic chemotherapy with concurrent radiotherapy.  The patient states that overall she feels well and that she has even returned to work part-time.  This is a temporary position feeling in for someone else who is sick but she is happy to be out of the house and staying busy.  Pulmonary embolus-this was noted as an incidental finding back in April 2021.  She is maintained on Xarelto which is being prescribed by her oncologist.  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   Hemochromatosis associated with compound heterozygous mutation in HFE gene ([place]) 12/10/2017   Hyperlipidemia    Hypertension    Hyperthyroidism 10/31/2014   lung ca dx'd 03/2019     Social History   Socioeconomic History   Marital status: Married    Spouse name: Not on file   Number of  children: Not on file   Years of education: Not on file   Highest education level: Not on file  Occupational History   Not on file  Tobacco Use   Smoking status: Former Smoker    Packs/day: 1.00    Types: Cigarettes    Quit date: 03/21/2019    Years since quitting: 0.7   Smokeless tobacco: Never Used   Tobacco comment: started age 7 , quit for a year many times  Vaping Use   Vaping Use: Never used  Substance and Sexual Activity   Alcohol use: No    Alcohol/week: 0.0 standard drinks   Drug use: No   Sexual activity: Not on file  Other Topics Concern   Not on file  Social History Narrative   Married   Clinical cytogeneticist- full time   Daughter- 2 grandchildren, live in [place]   Enjoys playing on computer.     Social Determinants of Health   Financial Resource Strain:    Difficulty of Paying Living Expenses: Not on file  Food Insecurity:    Worried About Charity fundraiser in the Last Year: Not on file   [employer] of Food in the Last Year: Not on file  Transportation Needs:    Lack of Transportation (Medical): Not on file   Lack of  Transportation (Non-Medical): Not on file  Physical Activity:    Days of Exercise per Week: Not on file   Minutes of Exercise per Session: Not on file  Stress:    Feeling of Stress : Not on file  Social Connections:    Frequency of Communication with Friends and Family: Not on file   Frequency of Social Gatherings with Friends and Family: Not on file   Attends Religious Services: Not on file   Active Member of Clubs or Organizations: Not on file   Attends Archivist Meetings: Not on file   Marital Status: Not on file  Intimate Partner Violence:    Fear of Current or Ex-Partner: Not on file   Emotionally Abused: Not on file   Physically Abused: Not on file   Sexually Abused: Not on file    Past Surgical History:  Procedure Laterality Date   ABLATION  03-04-2013   CTI by Dr Lovena Le for atrial flutter    ATRIAL FLUTTER ABLATION N/A 03/04/2013   Procedure: ATRIAL FLUTTER ABLATION;  Surgeon: Evans Lance, MD;  Location: [facility] CATH LAB;  Service: Cardiovascular;  Laterality: N/A;   CHOLECYSTECTOMY  1982   COLONOSCOPY W/ POLYPECTOMY     x 2   ESOPHAGOGASTRODUODENOSCOPY N/A 05/20/2014   Procedure: ESOPHAGOGASTRODUODENOSCOPY (EGD);  Surgeon: Teena Irani, MD;  Location: [facility];  Service: Endoscopy;  Laterality: N/A;   IR IMAGING GUIDED PORT INSERTION  05/09/2019   TONSILLECTOMY  1972 ?   VIDEO BRONCHOSCOPY WITH ENDOBRONCHIAL ULTRASOUND Left 04/30/2019   Procedure: VIDEO BRONCHOSCOPY WITH ENDOBRONCHIAL ULTRASOUND;  Surgeon: Garner Nash, DO;  Location: [facility];  Service: Pulmonary;  Laterality: Left;    Family History  Problem Relation Age of Onset   Atrial fibrillation Mother    Arrhythmia Mother    Diabetes Mother    Thyroid disease Mother    Hodgkin's lymphoma Father    Thyroid disease Daughter        Graves disease   Diabetes Daughter    Anxiety disorder Neg Hx     Allergies  Allergen Reactions   Jardiance [Empagliflozin] Palpitations    Causes heart palpitations   Trazodone And Nefazodone Other (See Comments)    "hyped me up, could not sleep"    Current Outpatient Medications on File Prior to Visit  Medication Sig Dispense Refill   Calcium Carb-Cholecalciferol (CALTRATE 600+D3 PO) Take 1 tablet by mouth every evening.      carvedilol (COREG) 12.5 MG tablet Take 1 tablet (12.5 mg total) by mouth in the morning and at bedtime. 180 tablet 3   Continuous Blood Gluc Sensor (FREESTYLE LIBRE 2 SENSOR) MISC USE TO CHECK BLOOD SUGARS EVERY 14 DAYS. 2 each 0   diphenhydramine-acetaminophen (TYLENOL PM) 25-500 MG TABS tablet Take 2 tablets by mouth at bedtime.     flecainide (TAMBOCOR) 50 MG tablet TAKE 1 TABLET BY MOUTH TWICE DAILY, MAY TAKE AN ADDITIONAL 1 TABLET AS NEEDED. 270 tablet 2   furosemide (LASIX) 20 MG tablet Take 1 tablet (20 mg total) by mouth daily  as needed. (Patient taking differently: Take 20 mg by mouth daily as needed for fluid. ) 60 tablet 0   lisinopril-hydrochlorothiazide (ZESTORETIC) 20-25 MG tablet Take 1 tablet by mouth once daily 90 tablet 0   LORazepam (ATIVAN) 0.5 MG tablet Place 1 tablet (0.5 mg total) under the tongue every 8 (eight) hours as needed (nausea). 30 tablet 0   magnesium oxide (MAG-OX) 400 (241.3 Mg) MG tablet Take 1 tablet (  400 mg total) by mouth 3 (three) times daily. 90 tablet 0   metFORMIN (GLUCOPHAGE) 500 MG tablet Take 1 tab at dinnertime (Patient taking differently: Take 500 mg by mouth at bedtime. ) 90 tablet 1   methimazole (TAPAZOLE) 10 MG tablet Take 1 tablet (10 mg total) by mouth daily. 90 tablet 0   potassium chloride SA (KLOR-CON) 20 MEQ tablet TAKE 1  BY MOUTH ONCE DAILY (Patient taking differently: Take 20 mEq by mouth daily. TAKE 1  BY MOUTH ONCE DAILY) 90 tablet 0   repaglinide (PRANDIN) 1 MG tablet Take 1 tablet (1 mg total) by mouth 2 (two) times daily before a meal. 60 tablet 1   simvastatin (ZOCOR) 10 MG tablet TAKE 1 TABLET BY MOUTH AT BEDTIME 90 tablet 1   Tetrahydrozoline HCl (VISINE OP) Place 1 drop into both eyes daily as needed (irritation).     [DISCONTINUED] metFORMIN (GLUCOPHAGE) 500 MG tablet Take 2 tablets (1,000 mg total) by mouth 2 (two) times daily. 120 tablet 1   [DISCONTINUED] simvastatin (ZOCOR) 10 MG tablet TAKE 1 TABLET BY MOUTH AT BEDTIME (Patient taking differently: Take 10 mg by mouth at bedtime. ) 90 tablet 0   No current facility-administered medications on file prior to visit.    BP 115/67 (BP Location: Right Arm, Patient Position: Sitting, Cuff Size: Small)    Pulse 75    Temp 98.1 F (36.7 C) (Oral)    Resp 16    Ht 5\' 4"  (1.626 m)    Wt 125 lb (56.7 kg)    SpO2 100%    BMI 21.46 kg/m       Objective:   Physical Exam Constitutional:      Appearance: She is well-developed.  Neck:     Thyroid: No thyromegaly.  Cardiovascular:     Rate and Rhythm:  Normal rate and regular rhythm.     Heart sounds: Normal heart sounds. No murmur heard.   Pulmonary:     Effort: Pulmonary effort is normal. No respiratory distress.     Breath sounds: Normal breath sounds. No wheezing.  Musculoskeletal:     Cervical back: Neck supple.  Skin:    General: Skin is warm and dry.  Neurological:     Mental Status: She is alert and oriented to person, place, and time.  Psychiatric:        Behavior: Behavior normal.        Thought Content: Thought content normal.        Judgment: Judgment normal.           Assessment & Plan:  Metastatic small cell lung cancer-this is being managed by oncology and radiation oncology.  Diabetes type 2-A1c is stable.  She has lost a fair amount of weight since being diagnosed with lung cancer.  This likely contributes to her improved A1c.  This is being monitored by Endo.  Hyperthyroidism-she is maintained on Tapazole.  TSH is stable.  This is being monitored by Endo.  Hypertension-blood pressure is stable on lisinopril-hydrochlorothiazide 20-25 mg once daily.  Continue same.  Hyperlipidemia-LDL is at goal on simvastatin 10 mg once daily.  Continue same.  Hypokalemia-noted at her most recent visit to the emergency department.  Will obtain follow-up basic metabolic panel to reassess potassium.  History of pulmonary embolus-continue xarelto 20mg  once daily.  Flu shot today.  Covid shot series has been completed and updated in her immunizations.  This visit occurred during the SARS-CoV-2 public health emergency.  Safety protocols were  in place, including screening questions prior to the visit, additional usage of staff PPE, and extensive cleaning of exam room while observing appropriate contact time as indicated for disinfecting solutions.

## 2019-12-23 NOTE — Telephone Encounter (Signed)
I spoke with pt and she thought this was discontinued. I advised her to call Dr. Arvilla Market office in the AM to clarify.

## 2019-12-23 NOTE — Patient Instructions (Signed)
Please complete lab work prior to leaving.   

## 2019-12-23 NOTE — Telephone Encounter (Signed)
See mychart.  

## 2019-12-24 ENCOUNTER — Other Ambulatory Visit: Payer: Self-pay | Admitting: Endocrinology

## 2019-12-24 LAB — BASIC METABOLIC PANEL
BUN/Creatinine Ratio: 27 (calc) — ABNORMAL HIGH (ref 6–22)
BUN: 29 mg/dL — ABNORMAL HIGH (ref 7–25)
CO2: 27 mmol/L (ref 20–32)
Calcium: 9.7 mg/dL (ref 8.6–10.4)
Chloride: 104 mmol/L (ref 98–110)
Creat: 1.08 mg/dL — ABNORMAL HIGH (ref 0.50–0.99)
Glucose, Bld: 134 mg/dL — ABNORMAL HIGH (ref 65–99)
Potassium: 4 mmol/L (ref 3.5–5.3)
Sodium: 140 mmol/L (ref 135–146)

## 2019-12-24 NOTE — Telephone Encounter (Signed)
It was an incidental finding on the scan so treatment for 6-12 months is acceptable.  I think she completed 6 months of treatment so it is okay for her to discontinue Xarelto.  There was no evidence of residual clot on her recent scan.  Thank you.

## 2020-01-02 ENCOUNTER — Other Ambulatory Visit: Payer: Self-pay | Admitting: Family

## 2020-01-07 ENCOUNTER — Encounter: Payer: Self-pay | Admitting: *Deleted

## 2020-01-27 ENCOUNTER — Other Ambulatory Visit: Payer: Self-pay | Admitting: *Deleted

## 2020-01-27 MED ORDER — FREESTYLE LIBRE 2 SENSOR MISC: 3 refills | Status: DC

## 2020-01-28 ENCOUNTER — Inpatient Hospital Stay: Payer: 59 | Attending: Physician Assistant

## 2020-01-28 ENCOUNTER — Other Ambulatory Visit: Payer: Self-pay

## 2020-01-28 VITALS — BP 156/87 | HR 71 | Temp 98.0°F | Resp 18

## 2020-01-28 DIAGNOSIS — T451X5A Adverse effect of antineoplastic and immunosuppressive drugs, initial encounter: Secondary | ICD-10-CM

## 2020-01-28 DIAGNOSIS — C3492 Malignant neoplasm of unspecified part of left bronchus or lung: Secondary | ICD-10-CM | POA: Insufficient documentation

## 2020-01-28 DIAGNOSIS — D701 Agranulocytosis secondary to cancer chemotherapy: Secondary | ICD-10-CM

## 2020-01-28 DIAGNOSIS — Z452 Encounter for adjustment and management of vascular access device: Secondary | ICD-10-CM | POA: Diagnosis present

## 2020-01-28 MED ORDER — HEPARIN SOD (PORK) LOCK FLUSH 100 UNIT/ML IV SOLN
500.0000 [IU] | Freq: Once | INTRAVENOUS | Status: AC
  Administered 2020-01-28: 500 [IU]
  Filled 2020-01-28: qty 5

## 2020-01-28 MED ORDER — SODIUM CHLORIDE 0.9% FLUSH
10.0000 mL | Freq: Once | INTRAVENOUS | Status: AC
  Administered 2020-01-28: 10 mL
  Filled 2020-01-28: qty 10

## 2020-01-30 ENCOUNTER — Other Ambulatory Visit: Payer: Self-pay | Admitting: Family

## 2020-01-30 DIAGNOSIS — E876 Hypokalemia: Secondary | ICD-10-CM

## 2020-02-11 ENCOUNTER — Other Ambulatory Visit: Payer: Self-pay

## 2020-02-11 ENCOUNTER — Other Ambulatory Visit (INDEPENDENT_AMBULATORY_CARE_PROVIDER_SITE_OTHER): Payer: 59

## 2020-02-11 DIAGNOSIS — E1165 Type 2 diabetes mellitus with hyperglycemia: Secondary | ICD-10-CM | POA: Diagnosis not present

## 2020-02-11 DIAGNOSIS — Z794 Long term (current) use of insulin: Secondary | ICD-10-CM | POA: Diagnosis not present

## 2020-02-11 DIAGNOSIS — E0590 Thyrotoxicosis, unspecified without thyrotoxic crisis or storm: Secondary | ICD-10-CM

## 2020-02-11 LAB — BASIC METABOLIC PANEL
BUN: 17 mg/dL (ref 6–23)
CO2: 33 mEq/L — ABNORMAL HIGH (ref 19–32)
Calcium: 9.8 mg/dL (ref 8.4–10.5)
Chloride: 102 mEq/L (ref 96–112)
Creatinine, Ser: 0.9 mg/dL (ref 0.40–1.20)
GFR: 67.53 mL/min (ref >60.00)
Glucose, Bld: 164 mg/dL — ABNORMAL HIGH (ref 70–99)
Potassium: 3.5 mEq/L (ref 3.5–5.1)
Sodium: 141 mEq/L (ref 135–145)

## 2020-02-11 LAB — TSH: TSH: 0.55 u[IU]/mL (ref 0.35–4.50)

## 2020-02-11 LAB — T4, FREE: Free T4: 0.77 ng/dL (ref 0.60–1.60)

## 2020-02-11 LAB — HEMOGLOBIN A1C: Hgb A1c MFr Bld: 6.3 % (ref 4.6–6.5)

## 2020-02-12 ENCOUNTER — Ambulatory Visit (HOSPITAL_COMMUNITY)
Admission: RE | Admit: 2020-02-12 | Discharge: 2020-02-12 | Disposition: A | Discharge status: Home / Self Care | Payer: 59 | Source: Ambulatory Visit | Attending: Radiation Oncology | Admitting: Radiation Oncology

## 2020-02-12 DIAGNOSIS — C7931 Secondary malignant neoplasm of brain: Secondary | ICD-10-CM | POA: Insufficient documentation

## 2020-02-12 DIAGNOSIS — C3490 Malignant neoplasm of unspecified part of unspecified bronchus or lung: Secondary | ICD-10-CM | POA: Insufficient documentation

## 2020-02-12 IMAGING — MR MR HEAD WO/W CM
16 series · 48 of 48 positions shown · IV contrast (gadavist)
Comparison: [DATE]

CLINICAL DATA: Small cell lung cancer with headaches.

EXAM:
MRI HEAD WITHOUT AND WITH CONTRAST
TECHNIQUE: Multiplanar, multiecho pulse sequences of the brain and surrounding
structures were obtained without and with intravenous contrast.
CONTRAST:  6mL GADAVIST GADOBUTROL 1 MMOL/ML IV SOLN

[Series 5: DWI · axial · 3.0mm · 1.31mm/px · z∈[-55,+104]mm · 6 of 108 slices shown (1 of 4)]
[im 1/108]
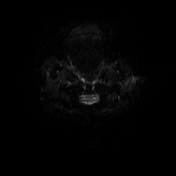
[im 22/108]
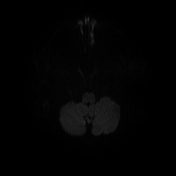
[im 43/108]
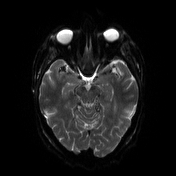
[im 65/108]
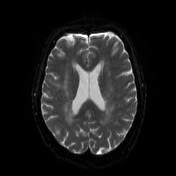
[im 86/108]
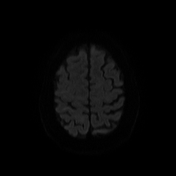
[im 108/108]
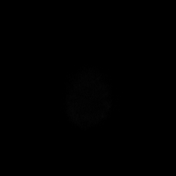

[Series 6: DWI · axial · 3.0mm · 1.31mm/px · z∈[-55,+104]mm · 2 of 54 slices shown (2 of 4)]
[im 1/54]
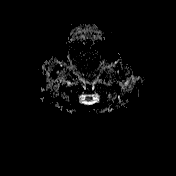
[im 54/54]
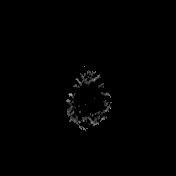

[Series 7: FLAIR · axial · 3.0mm · 0.69mm/px · z∈[-54,+102]mm · 2 of 53 slices shown]
[im 1/53]
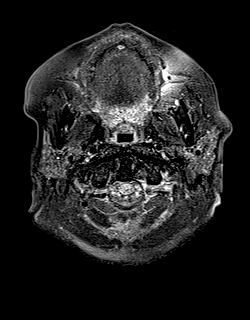
[im 53/53]
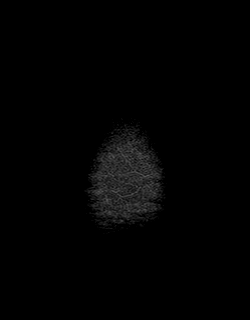

[Series 8: mip_images(sw) · axial · 24.0mm · 0.69mm/px · z∈[-40,+91]mm · 2 of 45 slices shown]
[im 1/45]
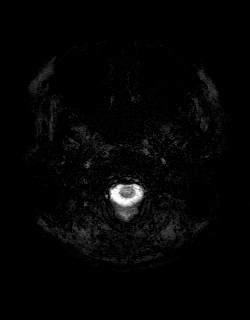
[im 45/45]
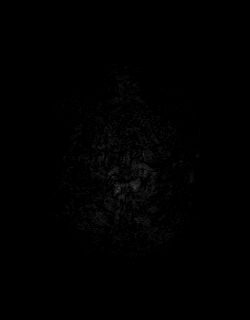

[Series 9: swi_images · axial · 3.0mm · 0.69mm/px · z∈[-51,+102]mm · 2 of 52 slices shown]
[im 1/52]
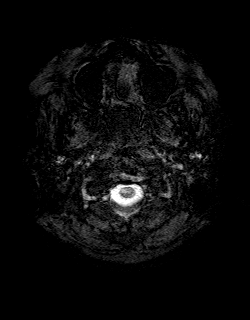
[im 52/52]
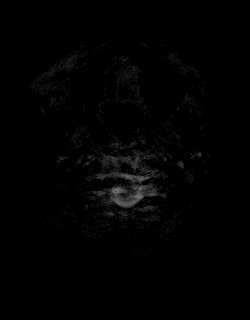

[Series 10: DWI · coronal · 5.0mm · 1.31mm/px · 3 of 72 slices shown (3 of 4)]
[im 1/72]
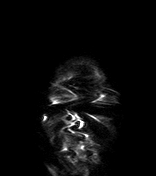
[im 36/72]
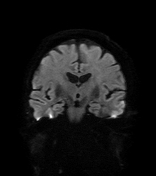
[im 72/72]
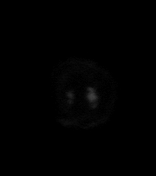

[Series 11: DWI · coronal · 5.0mm · 1.31mm/px · 2 of 36 slices shown (4 of 4)]
[im 1/36]
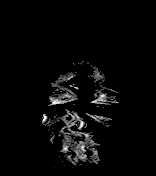
[im 36/36]
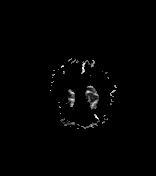

[Series 12: T1 · sagittal · 5.0mm · 0.75mm/px · 1 of 25 slices shown (1 of 2)]
[im 1/25]
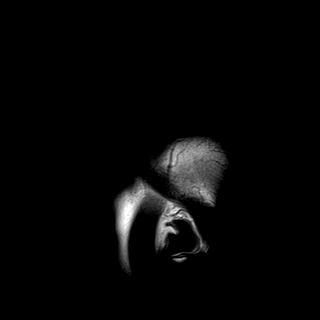

[Series 13: T2 · axial · 5.0mm · 0.57mm/px · 1 of 24 slices shown]
[im 1/24]
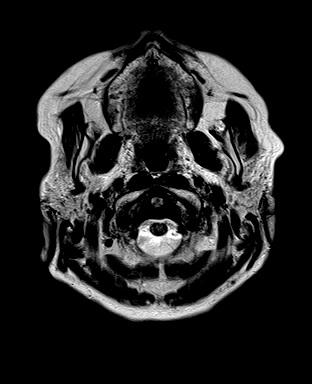

[Series 14: T1 · axial · 1.0mm · 0.86mm/px · z∈[-55,+104]mm · 7 of 160 slices shown (2 of 2)]
[im 1/160]
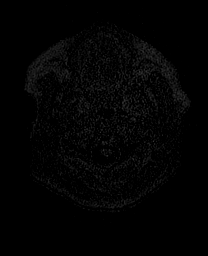
[im 27/160]
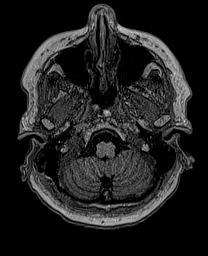
[im 54/160]
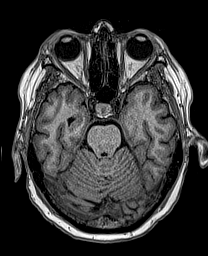
[im 80/160]
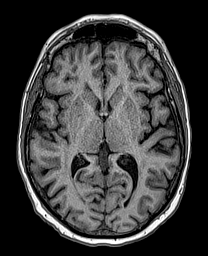
[im 107/160]
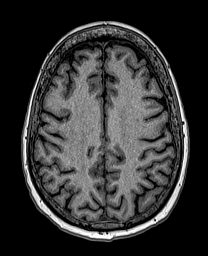
[im 133/160]
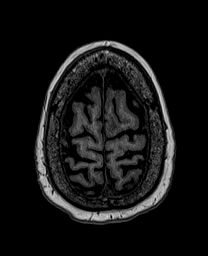
[im 160/160]
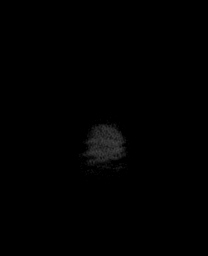

[Series 15: T2 post-contrast · coronal · 5.0mm · 0.57mm/px · 1 of 28 slices shown]
[im 1/28]
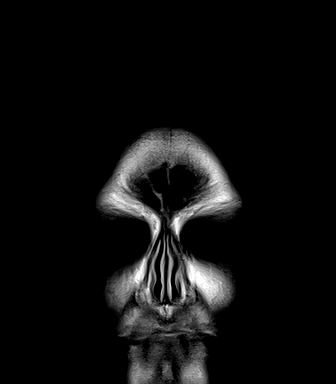

[Series 16: T1 post-contrast · axial · 1.0mm · 0.86mm/px · z∈[-55,+104]mm · 7 of 160 slices shown (1 of 3)]
[im 1/160]
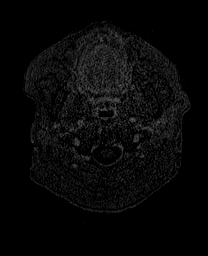
[im 27/160]
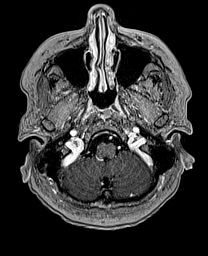
[im 54/160]
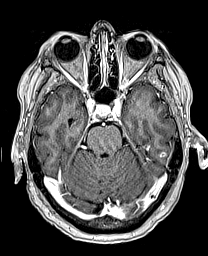
[im 80/160]
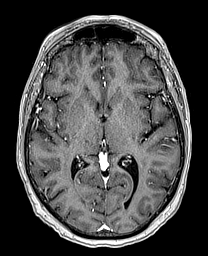
[im 107/160]
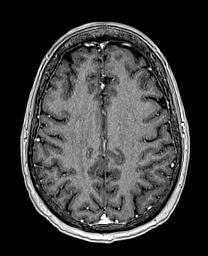
[im 133/160]
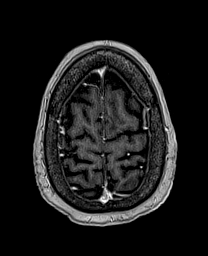
[im 160/160]
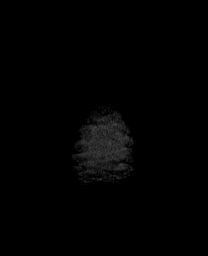

[Series 17: T1 post-contrast · coronal · 5.0mm · 0.43mm/px · 1 of 28 slices shown (2 of 3)]
[im 1/28]
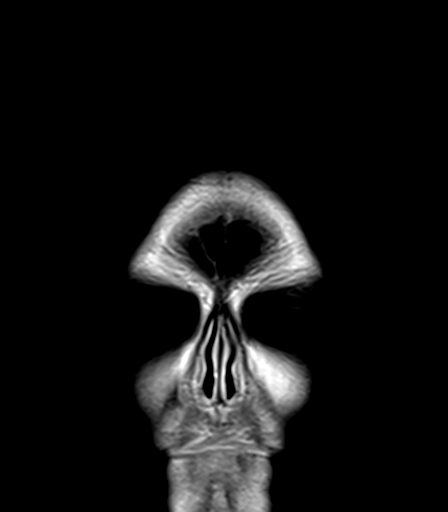

[Series 18: T1 post-contrast · sagittal · 5.0mm · 0.75mm/px · 1 of 25 slices shown (3 of 3)]
[im 1/25]
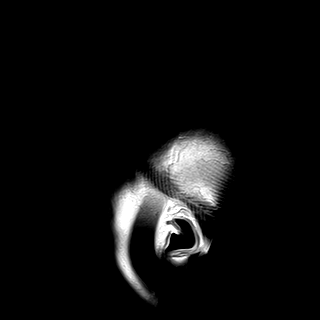

[Series 100: <mpr range> · axial · 1.0mm · 0.45mm/px · z∈[+26,+133]mm · 5 of 107 slices shown]
[im 1/107]
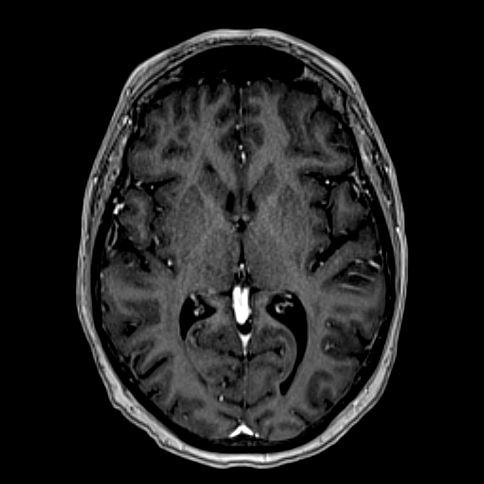
[im 27/107]
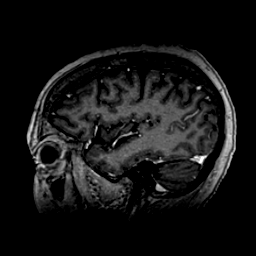
[im 54/107]
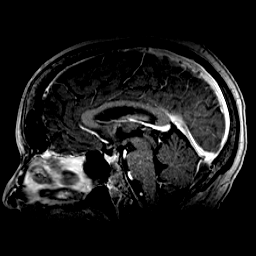
[im 80/107]
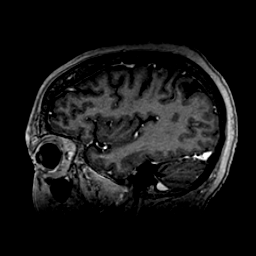
[im 107/107]
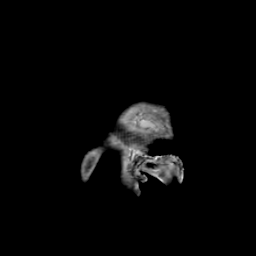

[Series 101: <mpr range(1)> · axial · 1.0mm · 0.45mm/px · z∈[+26,+119]mm · 5 of 107 slices shown]
[im 1/107]
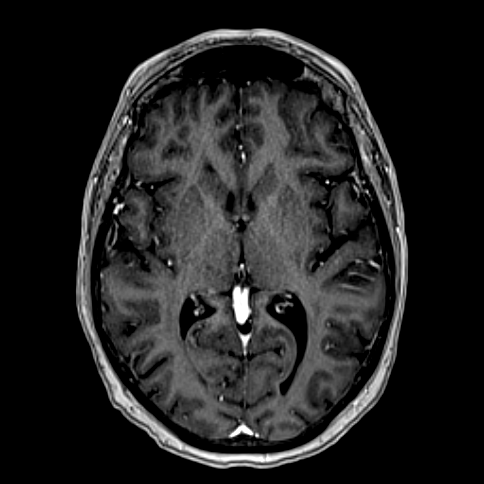
[im 27/107]
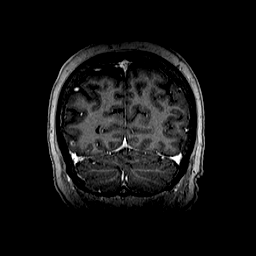
[im 54/107]
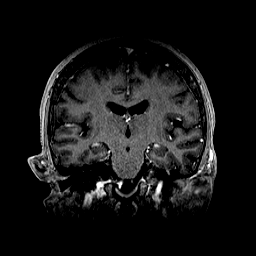
[im 80/107]
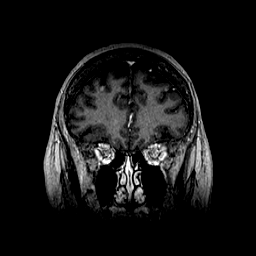
[im 107/107]
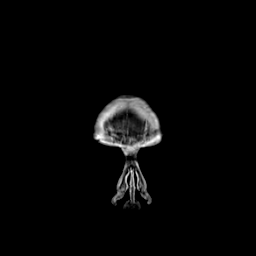

[48 of 48 positions shown; findings below may reference images not displayed]

FINDINGS: BRAIN

New Lesions:

Left inferior temporal occipital cortex on image 66 where there are
two 3 mm lesions.

Larger lesions:

All of the patient's lesions have increased in size by few mm, many
now showing ring enhancement rather than solid nodular enhancement.
All measurements are taken on postcontrast axial T1 weighted
imaging, series [DATE] mm in the lateral left cerebellum on image 42
2. Left inferior temporal cortex on image 53
3. 3 mm in the superior right cerebellum on image 55
4. 7 mm in the right frontal operculum on image 86
5. 5 mm in the anterior left frontal cortex on image 95
6. 5 mm in the anterior and superior right frontal cortex on image
114
7. 4 mm in the superior and anterior left frontal cortex on image
115
8. 9 mm at the high right frontal parietal junction on image 122
9. 6 mm along the high right precentral gyrus on image 130
10. 7 mm along the left frontal cortex at the vertex on image 138.

Stable or Smaller lesions: None.

Other Brain findings: No incidental infarct, hemorrhage,
hydrocephalus, or collection. Scattered vasogenic edema is minimal.

Vascular: Unremarkable

Skull and upper cervical spine: Unremarkable

Sinuses/Orbits: Unremarkable
IMPRESSION: Generalized progressive findings. There are 2 new subcentimeter left
temporoparietal cortical lesions and all of the pre-existing lesions
have increased in size by few millimeters. The largest lesion is 9
mm; no significant vasogenic edema.

## 2020-02-12 MED ORDER — GADOBUTROL 1 MMOL/ML IV SOLN
6.0000 mL | Freq: Once | INTRAVENOUS | Status: AC | PRN
  Administered 2020-02-12: 6 mL via INTRAVENOUS

## 2020-02-13 ENCOUNTER — Ambulatory Visit: Payer: 59 | Admitting: Endocrinology

## 2020-02-13 ENCOUNTER — Encounter: Payer: Self-pay | Admitting: *Deleted

## 2020-02-13 ENCOUNTER — Other Ambulatory Visit: Payer: Self-pay

## 2020-02-13 NOTE — Progress Notes (Deleted)
[name], female   DOB: 03/06/1956, 64 y.o.   MRN: [id number]                                                                                                               Reason for Appointment:  Hyperthyroidism and diabetes, follow-up  Referring healthcare provider: [name], NP   Chief complaint: Follow-up   History of Present Illness:   HYPOTHYROIDISM: It is unclear what symptoms the patient had at initial diagnosis but she had a low TSH in 2014 She was referred for endocrinology evaluation but she did not do so because of the cost  Subsequently in 02/2018 she was started on methimazole, at that time she was apparently still not having any symptoms  including palpitations, shakiness, feeling excessively warm and sweaty, weight loss, and fatigue.  Baseline free T4 and T3 levels were normal but her thyrotropin receptor antibody was high She does not think she felt any different with starting methimazole  She was referred for endocrinology consultation and seen in 4/21 She has been prescribed methimazole 15 mg daily since 1/21 when her free T4 was mildly increased She did not feel any different with starting the treatment   She has been told to take methimazole 10mg  daily However again she cannot remember to take this regularly and she thinks she is taking this only 3 to 4 days a week; this is because she thought she was supposed to take it in the morning and she forgets  Thyroid levels are in the normal range and TSH is still consistently normal  However thyrotropin receptor antibody is 3.44, higher than  in April  Wt Readings from Last 3 Encounters:  12/23/19 125 lb (56.7 kg)  12/04/19 129 lb (58.5 kg)  11/26/19 133 lb (60.3 kg)    Thyroid function tests as follows:     Lab Results  Component Value Date   FREET4 0.77 02/11/2020   FREET4 0.94 12/02/2019   FREET4 0.89 09/16/2019   T3FREE 2.9 06/23/2019   T3FREE 1.6 (L) 03/22/2019   T3FREE  3.5 02/25/2018   TSH 0.55 02/11/2020   TSH 0.73 12/02/2019   TSH 1.10 09/16/2019     Lab Results  Component Value Date   THYROTRECAB 3.44 (H) 12/02/2019   THYROTRECAB 2.72 (H) 06/23/2019   THYROTRECAB 41.3 (H) 03/08/2018   DIABETES management:  Date of diagnosis of type 2 diabetes mellitus:   2015?      Background history:  She has been on Metformin since diagnosis when her diabetes was relatively mild Subsequently Amaryl added After 2018 her blood sugars appear to be progressively worsening with increasing A1c She thinks that sometimes she would forget to take her medications in the morning and blood sugars would be higher for this reason She was admitted for severe hypoglycemia with her sepsis and recommended to be on insulin in 03/2019  Recent history:    INSULIN regimen is: Lantus 15 units at night, NovoLog 6 units in evening  Her A1c in 7/21  was 6.4  Non-insulin hypoglycemic drugs the patient is taking are: Metformin 0.5 g twice daily  Current management, blood sugar patterns and problems identified:  With her not getting any Decadron for quite some time she has had much better blood sugars  She said that she is starting to get relatively low sugars with using her freestyle libre  Because of relatively low sugar she is only taking Lantus now about once a week  Despite this she is still having relatively low sugars overnight  Her freestyle libre sensor is fairly close to the lab reading on the same date and time  She forgets to take her NovoLog at dinnertime and usually has high readings after dinner, averaging about 172  Mostly eating 1 meal a day now  Her weight is recently stable  She continues to take Metformin, but only 500 mg twice daily        Side effects from medications have been: None  Typical meal intake: Breakfast is usually eggs and toast, recently may not eat anything Mostly eating 1 meal in the evening        Exercise:  None  Glucose  monitoring:  As below    Blood Glucose readings  from freestyle libre, data only between 9/16 and 9/23 available:  CGM use % of time  55  2-week average/SD  112  Time in range     83   %  % Time Above 180  10  % Time above 250   % Time Below 70  7     PRE-MEAL Fasting Lunch Dinner Bedtime Overall  Glucose range:       Averages:  79  89  112   112   POST-MEAL PC Breakfast PC Lunch PC Dinner  Glucose range:     Averages:  89   172      Previously reviewed data:   CGM use % of time  81  Average and SD  169, GV 36  Time in range    59    %  % Time Above 180  26  % Time above 250  13  % Time Below target  2    PRE-MEAL Fasting Lunch Dinner Bedtime Overall  Glucose range:       Mean/median:  116  157  179   169   POST-MEAL PC Breakfast PC Lunch PC Dinner  Glucose range:     Mean/median:  128  188  228    Glycemic patterns summary: HIGHEST blood sugars are late at night between about 12-2 AM and starting to rise after 10 PM. Blood sugars are near normal early morning but variably high between 2-6 PM    Dietician visit, most recent: Never  Weight history: Wt Readings from Last 3 Encounters:  12/23/19 125 lb (56.7 kg)  12/04/19 129 lb (58.5 kg)  11/26/19 133 lb (60.3 kg)   Lab Results  Component Value Date   HGBA1C 6.3 02/11/2020   HGBA1C 6.4 09/16/2019   HGBA1C 10.4 (A) 06/23/2019   Lab Results  Component Value Date   MICROALBUR <0.7 05/10/2015   LDLCALC 50 10/29/2019   CREATININE 0.90 02/11/2020     Allergies as of 02/13/2020      Reactions   Jardiance [empagliflozin] Palpitations   Causes heart palpitations   Trazodone And Nefazodone Other (See Comments)   "hyped me up, could not sleep"      Medication List       Accurate as of  February 13, 2020  9:12 AM. If you have any questions, ask your nurse or doctor.        CALTRATE 600+D3 PO Take 1 tablet by mouth every evening.   carvedilol 12.5 MG tablet Commonly known as: COREG Take 1 tablet  (12.5 mg total) by mouth in the morning and at bedtime.   diphenhydramine-acetaminophen 25-500 MG Tabs tablet Commonly known as: TYLENOL PM Take 2 tablets by mouth at bedtime.   flecainide 50 MG tablet Commonly known as: TAMBOCOR TAKE 1 TABLET BY MOUTH TWICE DAILY, MAY TAKE AN ADDITIONAL 1 TABLET AS NEEDED.   FreeStyle Libre 2 Sensor Misc USE TO CHECK BLOOD SUGARS EVERY 14 DAYS.   furosemide 20 MG tablet Commonly known as: LASIX Take 1 tablet (20 mg total) by mouth daily as needed. What changed: reasons to take this   lisinopril-hydrochlorothiazide 20-25 MG tablet Commonly known as: ZESTORETIC Take 1 tablet by mouth once daily   LORazepam 0.5 MG tablet Commonly known as: Ativan Place 1 tablet (0.5 mg total) under the tongue every 8 (eight) hours as needed (nausea).   magnesium oxide 400 (241.3 Mg) MG tablet Commonly known as: MAG-OX Take 1 tablet (400 mg total) by mouth 3 (three) times daily.   metFORMIN 500 MG tablet Commonly known as: GLUCOPHAGE Take 1 tab at dinnertime What changed:   how much to take  how to take this  when to take this  additional instructions   methimazole 10 MG tablet Commonly known as: TAPAZOLE Take 1 tablet (10 mg total) by mouth daily.   potassium chloride SA 20 MEQ tablet Commonly known as: KLOR-CON Take 1 tablet (20 mEq total) by mouth daily.   repaglinide 1 MG tablet Commonly known as: Prandin Take 1 tablet (1 mg total) by mouth 2 (two) times daily before a meal.   simvastatin 10 MG tablet Commonly known as: ZOCOR TAKE 1 TABLET BY MOUTH AT BEDTIME   VISINE OP Place 1 drop into both eyes daily as needed (irritation).           Past Medical History:  Diagnosis Date  . Arthritis   . Atrial flutter [facility])    s/p CTI by Dr Lovena Le 02/2013  . Chest pain   . Diabetes mellitus without complication ([place])   . Dysrhythmia    hx of Aflutter  . 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 Hemochromatosis associated  with compound heterozygous mutation in HFE gene ([place]) 12/10/2017  . Hyperlipidemia   . Hypertension   . Hyperthyroidism 10/31/2014  . lung ca dx'd 03/2019    Past Surgical History:  Procedure Laterality Date  . ABLATION  03-04-2013   CTI by Dr Lovena Le for atrial flutter  . ATRIAL FLUTTER ABLATION N/A 03/04/2013   Procedure: ATRIAL FLUTTER ABLATION;  Surgeon: Evans Lance, MD;  Location: [facility] CATH LAB;  Service: Cardiovascular;  Laterality: N/A;  . CHOLECYSTECTOMY  1982  . COLONOSCOPY W/ POLYPECTOMY     x 2  . ESOPHAGOGASTRODUODENOSCOPY N/A 05/20/2014   Procedure: ESOPHAGOGASTRODUODENOSCOPY (EGD);  Surgeon: Teena Irani, MD;  Location: [facility] END[facility];  Service: Endoscopy;  Laterality: N/A;  . IR IMAGING GUIDED PORT INSERTION  05/09/2019  . TONSILLECTOMY  1972 ?  Marland Kitchen VIDEO BRONCHOSCOPY WITH ENDOBRONCHIAL ULTRASOUND Left 04/30/2019   Procedure: VIDEO BRONCHOSCOPY WITH ENDOBRONCHIAL ULTRASOUND;  Surgeon: Garner Nash, DO;  Location: [place];  Service: Pulmonary;  Laterality: Left;    Family History  Problem Relation Age of Onset  . Atrial  fibrillation Mother   . Arrhythmia Mother   . Diabetes Mother   . Thyroid disease Mother   . Hodgkin's lymphoma Father   . Thyroid disease Daughter        Graves disease  . Diabetes Daughter   . Anxiety disorder Neg Hx     Social History:  reports that she quit smoking about 10 months ago. Her smoking use included cigarettes. She smoked 1.00 pack per day. She has never used smokeless tobacco. She reports that she does not drink alcohol and does not use drugs.  Allergies:  Allergies  Allergen Reactions  . Jardiance [Empagliflozin] Palpitations    Causes heart palpitations  . Trazodone And Nefazodone Other (See Comments)    "hyped me up, could not sleep"     Review of Systems  Hypertension has been treated with Zestoretic However she has not taken her potassium supplement  Lab Results  Component Value Date   K 3.5 02/11/2020     Currently  on 10 mg simvastatin for hyperlipidemia  Lab Results  Component Value Date   CHOL 121 10/29/2019   CHOL 80 (L) 07/10/2019   CHOL 149 02/21/2018   Lab Results  Component Value Date   HDL 42 10/29/2019   HDL 25 (L) 07/10/2019   HDL 40 02/21/2018   Lab Results  Component Value Date   LDLCALC 50 10/29/2019   LDLCALC 21 07/10/2019   LDLCALC 46 02/21/2018   Lab Results  Component Value Date   TRIG 177 (H) 10/29/2019   TRIG 222 (H) 07/10/2019   TRIG 315 (H) 02/21/2018   Lab Results  Component Value Date   CHOLHDL 2.9 10/29/2019   CHOLHDL 3.7 02/21/2018   CHOLHDL 4 09/04/2016   No results found for: LDLDIRECT    Examination:   There were no vitals taken for this visit.  Thyroid not palpable   Assessment/Plan:   Hyperthyroidism, from Graves' disease   She has had longstanding subclinical hyperthyroidism for many years presenting mostly as a low TSH  She has been on treatment with 10 mg methimazole although she is taking this very regularly and about half the time Has been she has no symptoms of hyper or hypothyroidism  She is reluctant to do the I-131 treatment which was recommended because of her lack of remission on long-term treatment with methimazole Also has high thyrotropin receptor antibody    She will take her methimazole in the evening when she is taking her other medication but reduce the tablet to half of the 10 mg daily  Follow-up in 2 months   DIABETES type II on insulin:  See history of present illness for detailed discussion of current diabetes management, blood sugar patterns and problems identified  She has been on basal bolus insulin and Metformin  Her A1c has come down to 6.4 compared to 10.4  Blood sugars are much lower with her being on steroids for her chemotherapy She is not getting hypoglycemic even with taking Lantus only once a week However most of her high readings are after dinner when she is usually late in taking her NovoLog  insulin Also on Metformin but taking only 1000 mg a day  DIABETES RECOMMENDATIONS:  She will continue using the freestyle libre again and use it regularly as long as she can afford it  Stop all insulin PRANDIN 1 mg before dinner, explained how this works, timing of taking the medication and adjustment based on meal size and postprandial reading She will try to  make sure she takes it before starting to eat and take 2 tablets if eating larger meals or if the blood sugars are going up over 180 after eating She can reduce her Metformin to only 1 tablet in the evening Encouraged her to start regular walking  For hypokalemia she needs to restart her potassium and take it with her other medications in the evening  There are no Patient Instructions on file for this visit.   Elayne Snare 02/13/2020, 9:12 AM    Note: This office note was prepared with Dragon voice recognition system technology. Any transcriptional errors that result from this process are unintentional.

## 2020-02-16 NOTE — Progress Notes (Signed)
[name], female   DOB: 1955-07-23, 64 y.o.   MRN: [id number]                                                                                                               Reason for Appointment:  Hyperthyroidism and diabetes, follow-up  Referring healthcare provider: [name], NP   Chief complaint: Follow-up   History of Present Illness:   HYPOTHYROIDISM: It is unclear what symptoms the patient had at initial diagnosis but she had a low TSH in 2014 She was referred for endocrinology evaluation but she did not do so because of the cost  Subsequently in 02/2018 she was started on methimazole, at that time she was apparently still not having any symptoms  including palpitations, shakiness, feeling excessively warm and sweaty, weight loss, and fatigue.  Baseline free T4 and T3 levels were normal but her thyrotropin receptor antibody was high She does not think she felt any different with starting methimazole  She was referred for endocrinology consultation and seen in 4/21 She had been prescribed methimazole 15 mg daily since 1/21 when her free T4 was mildly increased She did not feel any different with starting the treatment   She has been told to take methimazole 10 mg daily but 11/2019 she was not taking this regularly She was told to take only 5 mg daily since her thyroid levels were still about the same as on 10 mg However she is still taking 10 mg daily  Thyroid levels are in the normal range and TSH is still consistently normal Free T4 is relatively lower than usual at 0.77  Previously thyrotropin receptor antibody is 3.44, higher than  in April  Wt Readings from Last 3 Encounters:  02/17/20 129 lb (58.5 kg)  12/23/19 125 lb (56.7 kg)  12/04/19 129 lb (58.5 kg)    Thyroid function tests as follows:     Lab Results  Component Value Date   FREET4 0.77 02/11/2020   FREET4 0.94 12/02/2019   FREET4 0.89 09/16/2019   T3FREE 2.9 06/23/2019    T3FREE 1.6 (L) 03/22/2019   T3FREE 3.5 02/25/2018   TSH 0.55 02/11/2020   TSH 0.73 12/02/2019   TSH 1.10 09/16/2019     Lab Results  Component Value Date   THYROTRECAB 3.44 (H) 12/02/2019   THYROTRECAB 2.72 (H) 06/23/2019   THYROTRECAB 41.3 (H) 03/08/2018   DIABETES management:  Date of diagnosis of type 2 diabetes mellitus:   2015?      Background history:  She has been on Metformin since diagnosis when her diabetes was relatively mild Subsequently Amaryl added After 2018 her blood sugars appear to be progressively worsening with increasing A1c She thinks that sometimes she would forget to take her medications in the morning and blood sugars would be higher for this reason She was admitted for severe hypoglycemia with her sepsis and recommended to be on insulin in 03/2019  Recent history:   Her A1c is 6.3  Non-insulin hypoglycemic drugs the patient is  taking are: Metformin 0.5 g once daily, Prandin 1 mg twice daily  Current management, blood sugar patterns and problems identified:  Her insulin was stopped in 9/21  She is still not needing any chemotherapy with steroids and able to get fairly good control with Metformin and Prandin only  Blood sugars are still excellent with recently 88% of her readings within the target range although has minimal monitoring the last 7 to 14 days on her sensor which could not be downloaded  Not clear how often she is having readings after meals but only 11% of readings are above 180  She is working and because of not remembering to take her Prandin with her she will not take it at lunchtime  Although she thinks that she is eating salads most of the time she will sometimes have potato soup which will raise her blood sugar at lunchtime  Evening sugars are better with taking Prandin more regularly  No recent weight change  She continues to take Metformin, but only 500 mg twice daily        Side effects from medications have been:  None  Typical meal intake: Breakfast is usually eggs and toast, recently may not eat anything Mostly eating 1 meal in the evening         Glucose monitoring:  As below    Blood Glucose readings  from freestyle libre, data for the last month showed the following   PRE-MEAL  overnight  mornings  afternoons  evenings Overall  Glucose range:       Mean/median:  104  1 08  152  124  118   Previous data:  CGM use % of time  55  2-week average/SD  112  Time in range     83   %  % Time Above 180  10  % Time above 250   % Time Below 70  7     PRE-MEAL Fasting Lunch Dinner Bedtime Overall  Glucose range:       Averages:  79  89  112   112   POST-MEAL PC Breakfast PC Lunch PC Dinner  Glucose range:     Averages:  89   172    Dietician visit, most recent: Never  Weight history: Wt Readings from Last 3 Encounters:  02/17/20 129 lb (58.5 kg)  12/23/19 125 lb (56.7 kg)  12/04/19 129 lb (58.5 kg)   Lab Results  Component Value Date   HGBA1C 6.3 02/11/2020   HGBA1C 6.4 09/16/2019   HGBA1C 10.4 (A) 06/23/2019   Lab Results  Component Value Date   MICROALBUR <0.7 05/10/2015   LDLCALC 50 10/29/2019   CREATININE 0.90 02/11/2020     Allergies as of 02/17/2020      Reactions   Jardiance [empagliflozin] Palpitations   Causes heart palpitations   Trazodone And Nefazodone Other (See Comments)   "hyped me up, could not sleep"      Medication List       Accurate as of February 17, 2020 11:00 AM. If you have any questions, ask your nurse or doctor.        CALTRATE 600+D3 PO Take 1 tablet by mouth every evening.   carvedilol 12.5 MG tablet Commonly known as: COREG Take 1 tablet (12.5 mg total) by mouth in the morning and at bedtime.   diphenhydramine-acetaminophen 25-500 MG Tabs tablet Commonly known as: TYLENOL PM Take 2 tablets by mouth at bedtime.   flecainide 50 MG tablet Commonly  known as: TAMBOCOR TAKE 1 TABLET BY MOUTH TWICE DAILY, MAY TAKE AN ADDITIONAL 1  TABLET AS NEEDED.   FreeStyle Libre 2 Sensor Misc USE TO CHECK BLOOD SUGARS EVERY 14 DAYS.   furosemide 20 MG tablet Commonly known as: LASIX Take 1 tablet (20 mg total) by mouth daily as needed. What changed: reasons to take this   lisinopril-hydrochlorothiazide 20-25 MG tablet Commonly known as: ZESTORETIC Take 1 tablet by mouth once daily   LORazepam 0.5 MG tablet Commonly known as: Ativan Place 1 tablet (0.5 mg total) under the tongue every 8 (eight) hours as needed (nausea).   magnesium oxide 400 (241.3 Mg) MG tablet Commonly known as: MAG-OX Take 1 tablet (400 mg total) by mouth 3 (three) times daily.   metFORMIN 500 MG tablet Commonly known as: GLUCOPHAGE Take 1 tab at dinnertime What changed:   how much to take  how to take this  when to take this  additional instructions   methimazole 10 MG tablet Commonly known as: TAPAZOLE Take 1 tablet (10 mg total) by mouth daily.   potassium chloride SA 20 MEQ tablet Commonly known as: KLOR-CON Take 1 tablet (20 mEq total) by mouth daily.   repaglinide 1 MG tablet Commonly known as: Prandin Take 1 tablet (1 mg total) by mouth 2 (two) times daily before a meal.   simvastatin 10 MG tablet Commonly known as: ZOCOR TAKE 1 TABLET BY MOUTH AT BEDTIME   VISINE OP Place 1 drop into both eyes daily as needed (irritation).           Past Medical History:  Diagnosis Date  . Arthritis   . Atrial flutter [facility])    s/p CTI by Dr Lovena Le 02/2013  . Chest pain   . Diabetes mellitus without complication ([place])   . Dysrhythmia    hx of Aflutter  . 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 Hemochromatosis associated with compound heterozygous mutation in HFE gene ([place]) 12/10/2017  . Hyperlipidemia   . Hypertension   . Hyperthyroidism 10/31/2014  . lung ca dx'd 03/2019    Past Surgical History:  Procedure Laterality Date  . ABLATION  03-04-2013   CTI by Dr Lovena Le for atrial flutter  . ATRIAL FLUTTER  ABLATION N/A 03/04/2013   Procedure: ATRIAL FLUTTER ABLATION;  Surgeon: Evans Lance, MD;  Location: [facility] CATH LAB;  Service: Cardiovascular;  Laterality: N/A;  . CHOLECYSTECTOMY  1982  . COLONOSCOPY W/ POLYPECTOMY     x 2  . ESOPHAGOGASTRODUODENOSCOPY N/A 05/20/2014   Procedure: ESOPHAGOGASTRODUODENOSCOPY (EGD);  Surgeon: Teena Irani, MD;  Location: [facility] END[facility];  Service: Endoscopy;  Laterality: N/A;  . IR IMAGING GUIDED PORT INSERTION  05/09/2019  . TONSILLECTOMY  1972 ?  Marland Kitchen VIDEO BRONCHOSCOPY WITH ENDOBRONCHIAL ULTRASOUND Left 04/30/2019   Procedure: VIDEO BRONCHOSCOPY WITH ENDOBRONCHIAL ULTRASOUND;  Surgeon: Garner Nash, DO;  Location: [facility];  Service: Pulmonary;  Laterality: Left;    Family History  Problem Relation Age of Onset  . Atrial fibrillation Mother   . Arrhythmia Mother   . Diabetes Mother   . Thyroid disease Mother   . Hodgkin's lymphoma Father   . Thyroid disease Daughter        Graves disease  . Diabetes Daughter   . Anxiety disorder Neg Hx     Social History:  reports that she quit smoking about 10 months ago. Her smoking use included cigarettes. She smoked 1.00 pack per day. She has never used smokeless tobacco.  She reports that she does not drink alcohol and does not use drugs.  Allergies:  Allergies  Allergen Reactions  . Jardiance [Empagliflozin] Palpitations    Causes heart palpitations  . Trazodone And Nefazodone Other (See Comments)    "hyped me up, could not sleep"     Review of Systems  Hypertension has been treated with Zestoretic However she has not taken her potassium supplement  Lab Results  Component Value Date   K 3.5 02/11/2020     Currently on 10 mg simvastatin for hyperlipidemia  Lab Results  Component Value Date   CHOL 121 10/29/2019   CHOL 80 (L) 07/10/2019   CHOL 149 02/21/2018   Lab Results  Component Value Date   HDL 42 10/29/2019   HDL 25 (L) 07/10/2019   HDL 40 02/21/2018   Lab Results  Component Value Date    LDLCALC 50 10/29/2019   LDLCALC 21 07/10/2019   LDLCALC 46 02/21/2018   Lab Results  Component Value Date   TRIG 177 (H) 10/29/2019   TRIG 222 (H) 07/10/2019   TRIG 315 (H) 02/21/2018   Lab Results  Component Value Date   CHOLHDL 2.9 10/29/2019   CHOLHDL 3.7 02/21/2018   CHOLHDL 4 09/04/2016   No results found for: LDLDIRECT    Examination:   BP 138/80   Pulse 100   Resp 16   Ht 5\' 4"  (1.626 m)   Wt 129 lb (58.5 kg)   SpO2 98%   BMI 22.14 kg/m   Reflexes appear normal   Assessment/Plan:   Hyperthyroidism, from Graves' disease   She has had longstanding subclinical hyperthyroidism for many years presenting mostly as a low TSH  She has been on treatment with 10 mg methimazole recently Free T4 is low normal As before she does not want to do I-131 treatment even though she has high thyrotropin receptor antibody as of 9/21 Since she is getting slightly lower free T4 levels she will reduce methimazole to 5 mg using half tablet of the 10 mg until her next visit   DIABETES type II nonobese:  See history of present illness for detailed discussion of current diabetes management, blood sugar patterns and problems identified  She has been off her basal bolus insulin regimen since she stopped getting steroids for chemotherapy Currently on Metformin which she is taking only 500 mg along with Prandin 1 mg twice daily  Her A1c has come down to 6.3  Difficult to assess her readings as she has not monitored regularly in the last couple of weeks Also unable to download her meter Recently her blood sugars are mostly near normal She does have occasionally high readings when she does not take her Prandin at lunchtime with a carbohydrate containing meal Postprandial readings at night are better with 1 mg Prandin and no hypoglycemia present  DIABETES RECOMMENDATIONS:  She will continue using the freestyle libre but try to use it regularly with monitoring 3 times a day at  least And use it regularly as long as she can afford it  PRANDIN 1 mg before lunchtime when she is eating any carbohydrate like potato soup Continue 500 mg Metformin since overnight blood sugars appear to be still fairly good  Continue follow-up with PCP for other problems including hypokalemia   There are no Patient Instructions on file for this visit.   Elayne Snare 02/17/2020, 11:00 AM    Note: This office note was prepared with Dragon voice recognition system technology. Any transcriptional errors  that result from this process are unintentional.

## 2020-02-17 ENCOUNTER — Ambulatory Visit
Admission: RE | Admit: 2020-02-17 | Discharge: 2020-02-17 | Disposition: A | Discharge status: Home / Self Care | Payer: 59 | Source: Ambulatory Visit | Attending: Radiation Oncology | Admitting: Radiation Oncology

## 2020-02-17 ENCOUNTER — Other Ambulatory Visit: Payer: Self-pay

## 2020-02-17 ENCOUNTER — Ambulatory Visit: Payer: 59 | Admitting: Endocrinology

## 2020-02-17 ENCOUNTER — Encounter: Payer: Self-pay | Admitting: Radiation Oncology

## 2020-02-17 VITALS — BP 138/80 | HR 100 | Resp 16 | Ht 64.0 in | Wt 129.0 lb

## 2020-02-17 VITALS — BP 124/79 | HR 85 | Resp 18 | Wt 130.1 lb

## 2020-02-17 DIAGNOSIS — Z794 Long term (current) use of insulin: Secondary | ICD-10-CM

## 2020-02-17 DIAGNOSIS — C779 Secondary and unspecified malignant neoplasm of lymph node, unspecified: Secondary | ICD-10-CM | POA: Diagnosis not present

## 2020-02-17 DIAGNOSIS — C7931 Secondary malignant neoplasm of brain: Secondary | ICD-10-CM | POA: Diagnosis present

## 2020-02-17 DIAGNOSIS — C3490 Malignant neoplasm of unspecified part of unspecified bronchus or lung: Secondary | ICD-10-CM

## 2020-02-17 DIAGNOSIS — Z79899 Other long term (current) drug therapy: Secondary | ICD-10-CM | POA: Insufficient documentation

## 2020-02-17 DIAGNOSIS — Z7984 Long term (current) use of oral hypoglycemic drugs: Secondary | ICD-10-CM | POA: Diagnosis not present

## 2020-02-17 DIAGNOSIS — C3412 Malignant neoplasm of upper lobe, left bronchus or lung: Secondary | ICD-10-CM | POA: Insufficient documentation

## 2020-02-17 DIAGNOSIS — E0590 Thyrotoxicosis, unspecified without thyrotoxic crisis or storm: Secondary | ICD-10-CM | POA: Diagnosis not present

## 2020-02-17 DIAGNOSIS — E1165 Type 2 diabetes mellitus with hyperglycemia: Secondary | ICD-10-CM | POA: Diagnosis not present

## 2020-02-17 NOTE — Progress Notes (Signed)
Location/Histology of Brain Tumor:  02/13/2020 5:04 AM - Interface, Rad Results In  Narrative & Impression  CLINICAL DATA:  Small cell lung cancer with headaches.  EXAM: MRI HEAD WITHOUT AND WITH CONTRAST  TECHNIQUE: Multiplanar, multiecho pulse sequences of the brain and surrounding structures were obtained without and with intravenous contrast.  CONTRAST:  66mL GADAVIST GADOBUTROL 1 MMOL/ML IV SOLN  COMPARISON:  11/20/2019  FINDINGS: BRAIN  New Lesions:  Left inferior temporal occipital cortex on image 66 where there are two 3 mm lesions.  Larger lesions:  All of the patient's lesions have increased in size by few mm, many now showing ring enhancement rather than solid nodular enhancement. All measurements are taken on postcontrast axial T1 weighted imaging, series 16:  1. 3 mm in the lateral left cerebellum on image 42 2. Left inferior temporal cortex on image 53 3. 3 mm in the superior right cerebellum on image 55 4. 7 mm in the right frontal operculum on image 86 5. 5 mm in the anterior left frontal cortex on image 95 6. 5 mm in the anterior and superior right frontal cortex on image 114 7. 4 mm in the superior and anterior left frontal cortex on image 115 8. 9 mm at the high right frontal parietal junction on image 122 9. 6 mm along the high right precentral gyrus on image 130 10. 7 mm along the left frontal cortex at the vertex on image 138.  Stable or Smaller lesions: None.  Other Brain findings: No incidental infarct, hemorrhage, hydrocephalus, or collection. Scattered vasogenic edema is minimal.  Vascular: Unremarkable  Skull and upper cervical spine: Unremarkable  Sinuses/Orbits: Unremarkable  IMPRESSION: Generalized progressive findings. There are 2 new subcentimeter left temporoparietal cortical lesions and all of the pre-existing lesions have increased in size by few millimeters. The largest lesion is 9 mm; no significant  vasogenic        Patient presented with symptoms of:   Past or anticipated interventions, if any, per neurosurgery:   Past or anticipated interventions, if any, per medical oncology:   Dose of Decadron, if applicable: N?A  Recent neurologic symptoms, if any:   Seizures:  No   Headaches:  Yes   Nausea: None  Dizziness/ataxia: None  Difficulty with hand coordination: States that she has difficulty holding objects.  Focal numbness/weakness: In hand and feet  Visual deficits/changes:  None  Confusion/Memory deficits:  States that she has to thinks about which word she would like to use  Painful bone metastases at present, if any: None  SAFETY ISSUES:  Prior radiation? Yes 09-11-2019-10-01-2019  Pacemaker/ICD?  No  Possible current pregnancy? No  Is the patient on methotrexate? No  Additional Complaints / other details: Vitals:   02/17/20 1425  BP: 124/79  Pulse: 85  Resp: 18  SpO2: 99%  Weight: 59 kg

## 2020-02-17 NOTE — Patient Instructions (Signed)
Take 1/2 of 10mg  tapazole  Take Repaglanide if eating potato soup at lunch

## 2020-02-17 NOTE — Progress Notes (Signed)
Radiation Oncology         [phone] ________________________________  Name: Christy Abbott MRN: [id number]  Date: 02/17/2020  DOB: 1955-12-06  Follow-Up Visit Note  CC: [name], NP  Volanda Napoleon, MD    ICD-10-CM   1. Primary malignant neoplasm of lung with metastasis to brain (HCC)  C34.90    C79.31   2. 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  with brain metastases, now with an additional diffuse recurrence involving the brain after whole brain radiation therapy  Interval Since Last Radiation:  4 months  Intent: Palliative  Radiation Treatment Dates: 09/11/2019 through 10/01/2019 Site Technique Total Dose (Gy) Dose per Fx (Gy) Completed Fx Beam Energies  Brain: Brain Complex 35/35 2.5 14/14 6X    Cancer Staging: Stage IIB (cT1cN1M0) limited stage small cell carcinoma of the left lung  Intent: Curative  Radiation Treatment Dates: 05/14/2019 through 06/24/2019 Site Technique Total Dose (Gy) Dose per Fx (Gy) Completed Fx Beam Energies  Lung, Left: Lung_ 3D 60 2.0 30 6x, 10x, 15x     Narrative:  The patient returns today to review results of her most recent brain MRI.  Unfortunately this shows diffuse recurrence after her whole brain radiation therapy 4 1/2 months ago.  Clinically the patient seems to be doing well.  She does have some occasional headaches but nothing out of the ordinary.  She denies any difficulties with memory or thought processes.  Her husband denies any confusion.  Occasionally she will have difficulty with word finding.  Patient reports problems with peripheral neuropathy since completion of her chemotherapy and radiation treatments directed at the chest.                            ALLERGIES:  is allergic to jardiance [empagliflozin] and trazodone and nefazodone.  Meds: Current Outpatient Medications  Medication Sig Dispense Refill  . Calcium  Carb-Cholecalciferol (CALTRATE 600+D3 PO) Take 1 tablet by mouth every evening.     . carvedilol (COREG) 12.5 MG tablet Take 1 tablet (12.5 mg total) by mouth in the morning and at bedtime. 180 tablet 3  . Continuous Blood Gluc Sensor (FREESTYLE LIBRE 2 SENSOR) MISC USE TO CHECK BLOOD SUGARS EVERY 14 DAYS. 2 each 3  . diphenhydramine-acetaminophen (TYLENOL PM) 25-500 MG TABS tablet Take 2 tablets by mouth at bedtime.    . flecainide (TAMBOCOR) 50 MG tablet TAKE 1 TABLET BY MOUTH TWICE DAILY, MAY TAKE AN ADDITIONAL 1 TABLET AS NEEDED. 270 tablet 2  . lisinopril-hydrochlorothiazide (ZESTORETIC) 20-25 MG tablet Take 1 tablet by mouth once daily 90 tablet 0  . metFORMIN (GLUCOPHAGE) 500 MG tablet Take 1 tab at dinnertime (Patient taking differently: Take 500 mg by mouth at bedtime. ) 90 tablet 1  . methimazole (TAPAZOLE) 10 MG tablet Take 1 tablet (10 mg total) by mouth daily. 90 tablet 0  . potassium chloride SA (KLOR-CON) 20 MEQ tablet Take 1 tablet (20 mEq total) by mouth daily. 30 tablet 0  . repaglinide (PRANDIN) 1 MG tablet Take 1 tablet (1 mg total) by mouth 2 (two) times daily before a meal. 60 tablet 1  . simvastatin (ZOCOR) 10 MG tablet TAKE 1 TABLET BY MOUTH AT BEDTIME 90 tablet 0  . Tetrahydrozoline HCl (VISINE OP) Place 1 drop into both eyes daily as needed (irritation).    Marland Kitchen  furosemide (LASIX) 20 MG tablet Take 1 tablet (20 mg total) by mouth daily as needed. (Patient not taking: Reported on 02/17/2020) 60 tablet 0  . LORazepam (ATIVAN) 0.5 MG tablet Place 1 tablet (0.5 mg total) under the tongue every 8 (eight) hours as needed (nausea). (Patient not taking: Reported on 02/17/2020) 30 tablet 0  . magnesium oxide (MAG-OX) 400 (241.3 Mg) MG tablet Take 1 tablet (400 mg total) by mouth 3 (three) times daily. (Patient not taking: Reported on 02/17/2020) 90 tablet 0   No current facility-administered medications for this encounter.    Physical Findings: The patient is in no acute distress.  Patient is alert and oriented.  weight is 130 lb 2 oz (59 kg). Her blood pressure is 124/79 and her pulse is 85. Her respiration is 18 and oxygen saturation is 99%. .  The lungs are clear to auscultation.  The heart has a regular rhythm and rate.  No palpable cervical supraclavicular or axillary adenopathy.  The detailed neurological examination is nonfocal.  Lab Findings: Lab Results  Component Value Date   WBC 7.1 12/12/2019   HGB 12.8 12/12/2019   HCT 36.8 12/12/2019   MCV 93.4 12/12/2019   PLT 160 12/12/2019    Radiographic Findings: MR Brain W Wo Contrast  Result Date: 02/13/2020 CLINICAL DATA:  Small cell lung cancer with headaches. EXAM: MRI HEAD WITHOUT AND WITH CONTRAST TECHNIQUE: Multiplanar, multiecho pulse sequences of the brain and surrounding structures were obtained without and with intravenous contrast. CONTRAST:  91mL GADAVIST GADOBUTROL 1 MMOL/ML IV SOLN COMPARISON:  11/20/2019 FINDINGS: BRAIN New Lesions: Left inferior temporal occipital cortex on image 66 where there are two 3 mm lesions. Larger lesions: All of the patient's lesions have increased in size by few mm, many now showing ring enhancement rather than solid nodular enhancement. All measurements are taken on postcontrast axial T1 weighted imaging, series 16: 1. 3 mm in the lateral left cerebellum on image 42 2. Left inferior temporal cortex on image 53 3. 3 mm in the superior right cerebellum on image 55 4. 7 mm in the right frontal operculum on image 86 5. 5 mm in the anterior left frontal cortex on image 95 6. 5 mm in the anterior and superior right frontal cortex on image 114 7. 4 mm in the superior and anterior left frontal cortex on image 115 8. 9 mm at the high right frontal parietal junction on image 122 9. 6 mm along the high right precentral gyrus on image 130 10. 7 mm along the left frontal cortex at the vertex on image 138. Stable or Smaller lesions: None. Other Brain findings: No incidental infarct, hemorrhage,  hydrocephalus, or collection. Scattered vasogenic edema is minimal. Vascular: Unremarkable Skull and upper cervical spine: Unremarkable Sinuses/Orbits: Unremarkable IMPRESSION: Generalized progressive findings. There are 2 new subcentimeter left temporoparietal cortical lesions and all of the pre-existing lesions have increased in size by few millimeters. The largest lesion is 9 mm; no significant vasogenic edema. Electronically Signed   By: Monte Fantasia M.D.   On: 02/13/2020 05:02    Impression: Stage IIB (cT1cN1M0) limited stage small cell carcinoma of the left lung  with brain metastases, now with an additional diffuse recurrence involving the brain after whole brain radiation therapy.  We discussed the most recent brain MRI findings in detail.  Patient's husband was present and her daughter was present by FaceTime.  In light of the diffuse involvement the patient would not be a candidate for stereotactic radiosurgery.  She would be a candidate for repeat whole brain radiation therapy with a dose less than what was initially given when she developed brain metastasis.  Discussed that additional radiation therapy may help to control the lesions within the brain for a longer period of time compared to not proceeding with radiation therapy.  We also discussed that the radiation of the whole brain is associated with risks that being increased risk for dementia memory loss and difficulty and difficulties with concentration.  Patient understands that without radiation therapy these lesions will progress in a matter of weeks or just a couple of months will cause significant problems.  Patient is scheduled for systemic imaging with chest abdomen and pelvis CT scan scheduled for December 15 followed by follow-up with Dr. Julien Nordmann.  Plan: Patient is unsure whether she would like to proceed with reirradiation of the brain.  She is understandably upset with these findings.  She did not wish to make a decision today  but will discuss with her family this evening and if she agrees with radiation therapy will proceed with CT simulation tomorrow and begin her treatments December 9 with 10 treatments anticipated.  Total time spent in this encounter was 35 minutes which included physical examination detailed discussion of MRI findings and review of patient's previous radiation treatments including the chest and brain radiation therapy.  ____________________________________ Gery Pray, MD

## 2020-02-18 ENCOUNTER — Other Ambulatory Visit: Payer: Self-pay

## 2020-02-18 ENCOUNTER — Ambulatory Visit
Admission: RE | Admit: 2020-02-18 | Discharge: 2020-02-18 | Disposition: A | Discharge status: Home / Self Care | Payer: 59 | Source: Ambulatory Visit | Attending: Radiation Oncology | Admitting: Radiation Oncology

## 2020-02-18 DIAGNOSIS — C3490 Malignant neoplasm of unspecified part of unspecified bronchus or lung: Secondary | ICD-10-CM

## 2020-02-18 DIAGNOSIS — C50911 Malignant neoplasm of unspecified site of right female breast: Secondary | ICD-10-CM | POA: Insufficient documentation

## 2020-02-18 DIAGNOSIS — Z510 Encounter for antineoplastic radiation therapy: Secondary | ICD-10-CM | POA: Diagnosis present

## 2020-02-18 DIAGNOSIS — C7931 Secondary malignant neoplasm of brain: Secondary | ICD-10-CM | POA: Diagnosis present

## 2020-02-19 ENCOUNTER — Other Ambulatory Visit: Payer: Self-pay

## 2020-02-19 ENCOUNTER — Ambulatory Visit
Admission: RE | Admit: 2020-02-19 | Discharge: 2020-02-19 | Disposition: A | Discharge status: Home / Self Care | Payer: 59 | Source: Ambulatory Visit | Attending: Radiation Oncology | Admitting: Radiation Oncology

## 2020-02-19 DIAGNOSIS — Z510 Encounter for antineoplastic radiation therapy: Secondary | ICD-10-CM | POA: Diagnosis not present

## 2020-02-19 DIAGNOSIS — C7931 Secondary malignant neoplasm of brain: Secondary | ICD-10-CM

## 2020-02-19 DIAGNOSIS — C3490 Malignant neoplasm of unspecified part of unspecified bronchus or lung: Secondary | ICD-10-CM

## 2020-02-20 ENCOUNTER — Ambulatory Visit: Payer: 59

## 2020-02-23 ENCOUNTER — Inpatient Hospital Stay: Payer: 59 | Attending: Physician Assistant

## 2020-02-23 ENCOUNTER — Encounter (HOSPITAL_COMMUNITY): Payer: Self-pay

## 2020-02-23 ENCOUNTER — Ambulatory Visit: Payer: 59

## 2020-02-23 ENCOUNTER — Other Ambulatory Visit: Payer: Self-pay

## 2020-02-23 ENCOUNTER — Ambulatory Visit (HOSPITAL_COMMUNITY)
Admission: RE | Admit: 2020-02-23 | Discharge: 2020-02-23 | Disposition: A | Discharge status: Home / Self Care | Payer: 59 | Source: Ambulatory Visit | Attending: Internal Medicine | Admitting: Internal Medicine

## 2020-02-23 DIAGNOSIS — Z923 Personal history of irradiation: Secondary | ICD-10-CM | POA: Insufficient documentation

## 2020-02-23 DIAGNOSIS — Z7984 Long term (current) use of oral hypoglycemic drugs: Secondary | ICD-10-CM | POA: Insufficient documentation

## 2020-02-23 DIAGNOSIS — I10 Essential (primary) hypertension: Secondary | ICD-10-CM | POA: Diagnosis not present

## 2020-02-23 DIAGNOSIS — C3490 Malignant neoplasm of unspecified part of unspecified bronchus or lung: Secondary | ICD-10-CM | POA: Diagnosis not present

## 2020-02-23 DIAGNOSIS — Z86711 Personal history of pulmonary embolism: Secondary | ICD-10-CM | POA: Diagnosis not present

## 2020-02-23 DIAGNOSIS — E785 Hyperlipidemia, unspecified: Secondary | ICD-10-CM | POA: Diagnosis not present

## 2020-02-23 DIAGNOSIS — J439 Emphysema, unspecified: Secondary | ICD-10-CM | POA: Diagnosis not present

## 2020-02-23 DIAGNOSIS — E119 Type 2 diabetes mellitus without complications: Secondary | ICD-10-CM | POA: Insufficient documentation

## 2020-02-23 DIAGNOSIS — Z7901 Long term (current) use of anticoagulants: Secondary | ICD-10-CM | POA: Insufficient documentation

## 2020-02-23 DIAGNOSIS — Z79899 Other long term (current) drug therapy: Secondary | ICD-10-CM | POA: Diagnosis not present

## 2020-02-23 DIAGNOSIS — C3432 Malignant neoplasm of lower lobe, left bronchus or lung: Secondary | ICD-10-CM | POA: Diagnosis not present

## 2020-02-23 DIAGNOSIS — C7931 Secondary malignant neoplasm of brain: Secondary | ICD-10-CM | POA: Insufficient documentation

## 2020-02-23 LAB — CBC WITH DIFFERENTIAL (CANCER CENTER ONLY)
Abs Immature Granulocytes: 0.01 10*3/uL (ref 0.00–0.07)
Basophils Absolute: 0 10*3/uL (ref 0.0–0.1)
Basophils Relative: 1 %
Eosinophils Absolute: 0.1 10*3/uL (ref 0.0–0.5)
Eosinophils Relative: 2 %
HCT: 34.9 % — ABNORMAL LOW (ref 36.0–46.0)
Hemoglobin: 12 g/dL (ref 12.0–15.0)
Immature Granulocytes: 0 %
Lymphocytes Relative: 20 %
Lymphs Abs: 1 10*3/uL (ref 0.7–4.0)
MCH: 32.7 pg (ref 26.0–34.0)
MCHC: 34.4 g/dL (ref 30.0–36.0)
MCV: 95.1 fL (ref 80.0–100.0)
Monocytes Absolute: 0.5 10*3/uL (ref 0.1–1.0)
Monocytes Relative: 10 %
Neutro Abs: 3.6 10*3/uL (ref 1.7–7.7)
Neutrophils Relative %: 67 %
Platelet Count: 156 10*3/uL (ref 150–400)
RBC: 3.67 MIL/uL — ABNORMAL LOW (ref 3.87–5.11)
RDW: 12.9 % (ref 11.5–15.5)
WBC Count: 5.3 10*3/uL (ref 4.0–10.5)
nRBC: 0 % (ref 0.0–0.2)

## 2020-02-23 LAB — CMP (CANCER CENTER ONLY)
ALT: 9 U/L (ref 0–44)
AST: 13 U/L — ABNORMAL LOW (ref 15–41)
Albumin: 3.8 g/dL (ref 3.5–5.0)
Alkaline Phosphatase: 96 U/L (ref 38–126)
Anion gap: 8 (ref 5–15)
BUN: 15 mg/dL (ref 8–23)
CO2: 29 mmol/L (ref 22–32)
Calcium: 9.6 mg/dL (ref 8.9–10.3)
Chloride: 107 mmol/L (ref 98–111)
Creatinine: 0.81 mg/dL (ref 0.44–1.00)
GFR, Estimated: >60 mL/min (ref >60)
Glucose, Bld: 127 mg/dL — ABNORMAL HIGH (ref 70–99)
Potassium: 3.3 mmol/L — ABNORMAL LOW (ref 3.5–5.1)
Sodium: 144 mmol/L (ref 135–145)
Total Bilirubin: 0.8 mg/dL (ref 0.3–1.2)
Total Protein: 6.9 g/dL (ref 6.5–8.1)

## 2020-02-23 IMAGING — CT CT ABD-PELV W/ CM
2 of 5 series · 12 of 36 positions shown, 15 images · IV contrast (APPLIED)
Comparison: Chest CT [DATE]

CLINICAL DATA: Restaging small cell lung cancer.

EXAM:
CT CHEST, ABDOMEN, AND PELVIS WITH CONTRAST
TECHNIQUE: Multidetector CT imaging of the chest, abdomen and pelvis was
performed following the standard protocol during bolus
administration of intravenous contrast.
CONTRAST:  75mL OMNIPAQUE IOHEXOL 300 MG/ML  SOLN

[Series 2: cap with · axial · 0.76mm/px · z∈[+788,+1298]mm · 9 of 128 slices shown, 12 images]
[im 13/128  mediastinal]
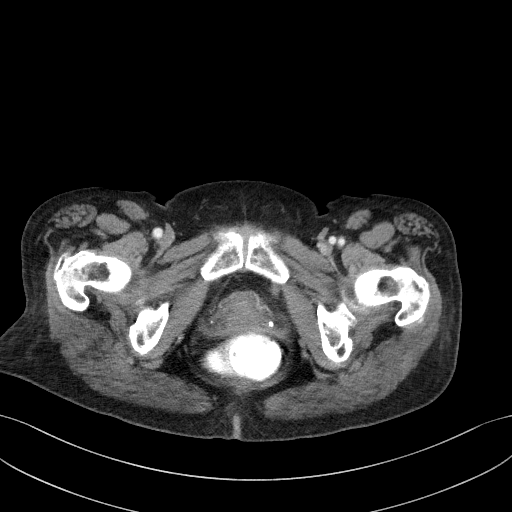
[im 13/128  lung]
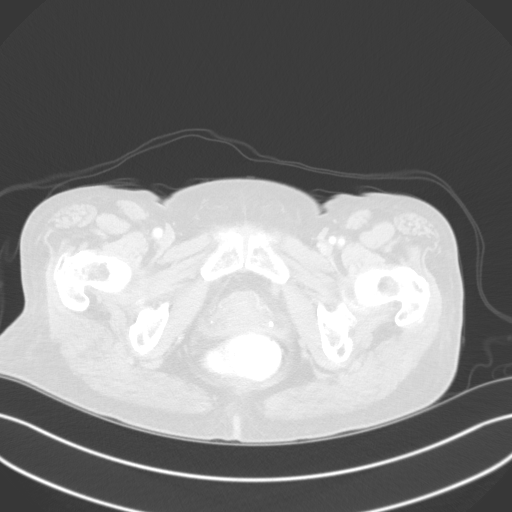
[im 26/128  lung]
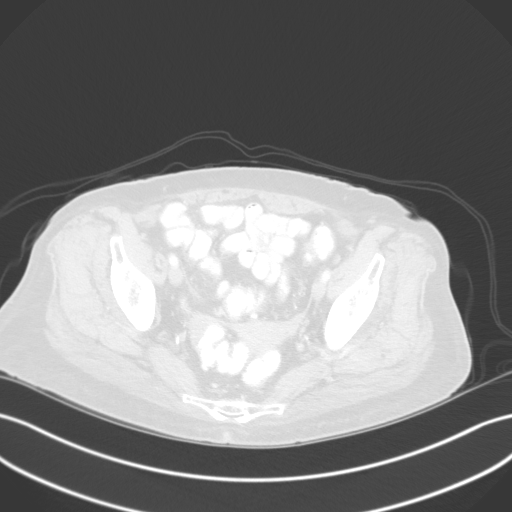
[im 39/128  lung]
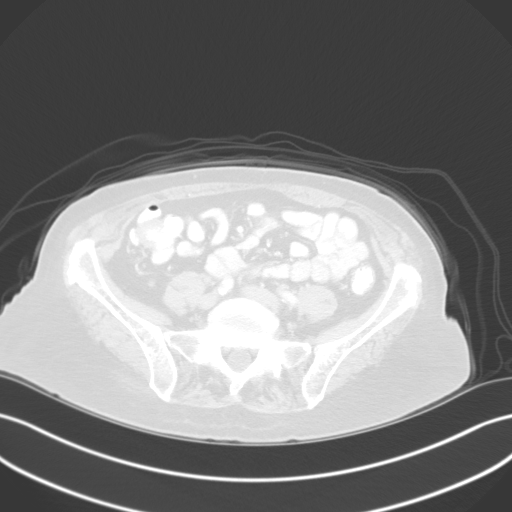
[im 51/128  lung]
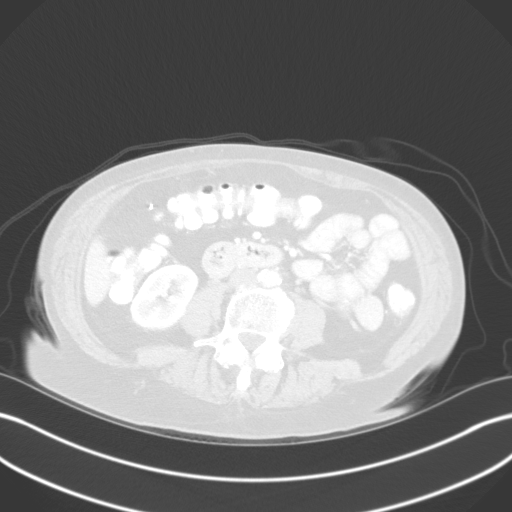
[im 64/128  mediastinal]
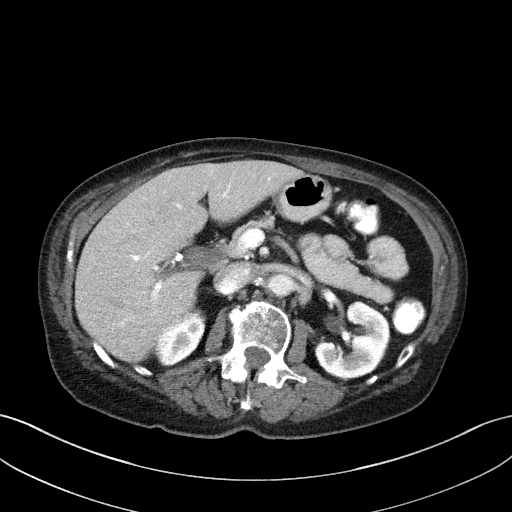
[im 64/128  lung]
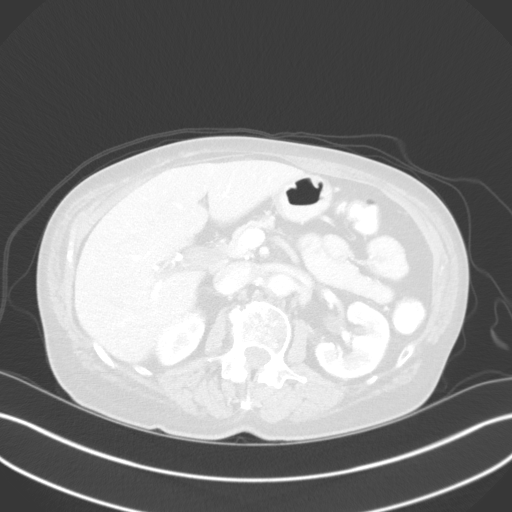
[im 77/128  lung]
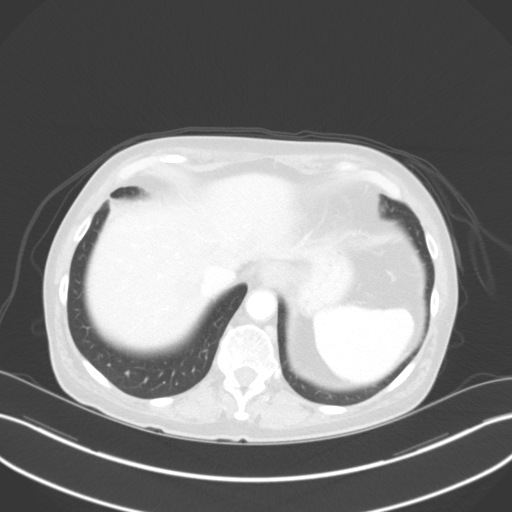
[im 89/128  lung]
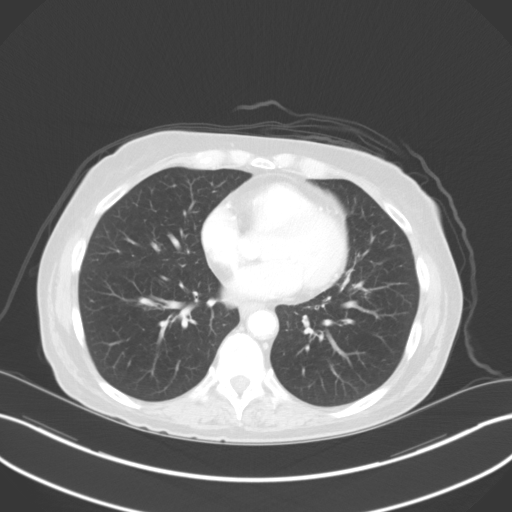
[im 102/128  lung]
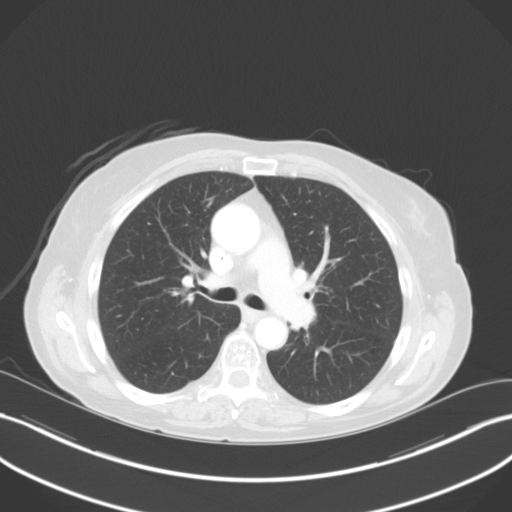
[im 115/128  mediastinal]
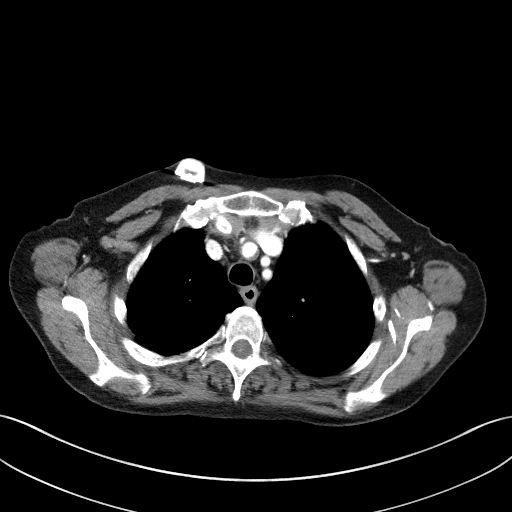
[im 115/128  lung]
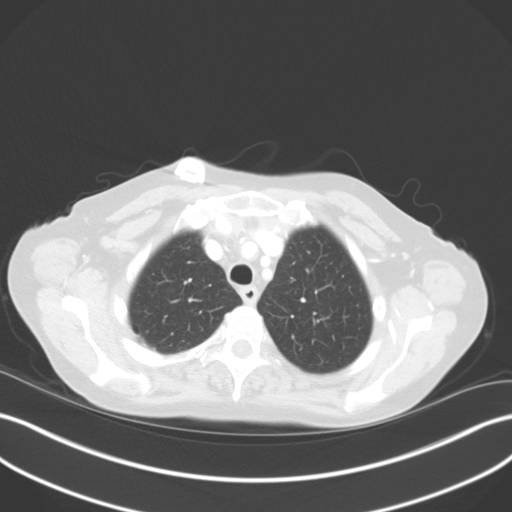

[Series 4: coronals · coronal · 0.74mm/px · 3 of 119 slices shown]
[im 24/119  lung]
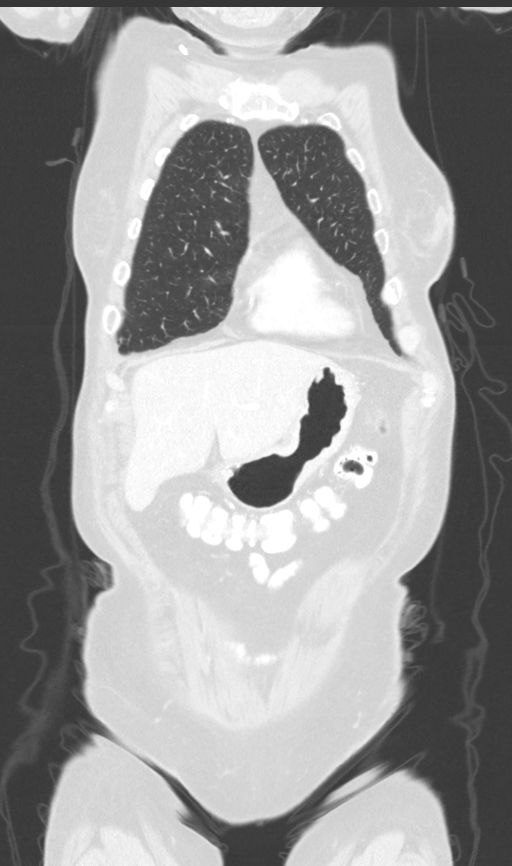
[im 48/119  lung]
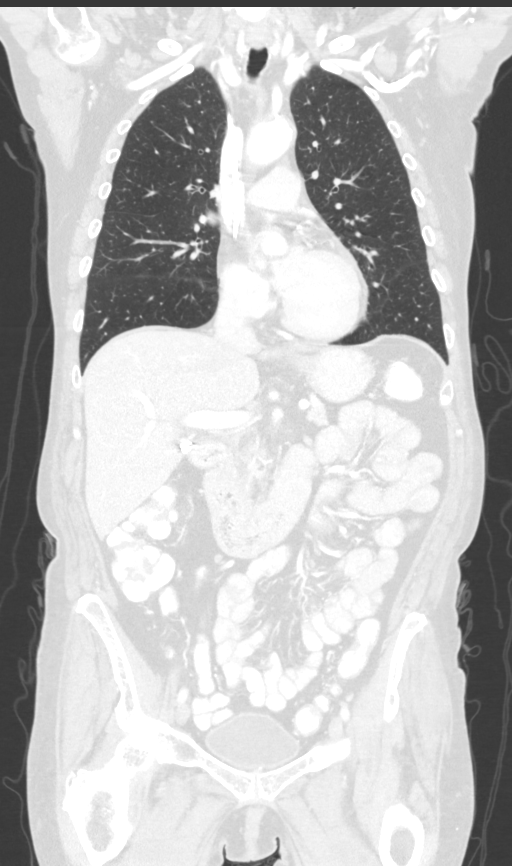
[im 71/119  lung]
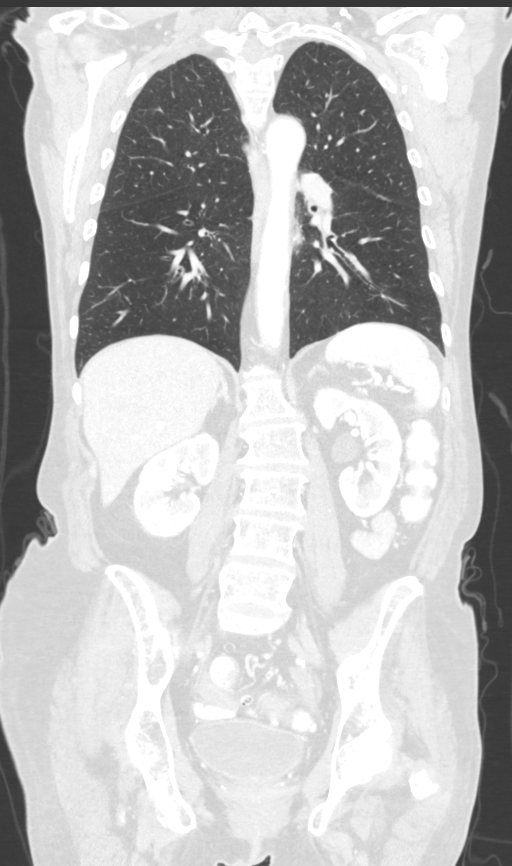

[12 of 36 positions shown; findings below may reference images not displayed]

FINDINGS: CT CHEST FINDINGS

Cardiovascular: The heart is normal in size. No pericardial
effusion. Stable aortic calcifications but no aneurysm or
dissection. Stable coronary artery calcifications. Pulmonary
arteries are grossly normal.

Mediastinum/Nodes: No mediastinal or hilar mass or lymphadenopathy.
Small scattered lymph nodes are stable. The esophagus is grossly
normal.

Lungs/Pleura: Stable underlying emphysematous changes. No worrisome
pulmonary lesions. No residual ground-glass nodules or solid
pulmonary nodules to suggest metastatic disease. No pleural
effusions or pleural nodules.

Musculoskeletal: No breast masses, supraclavicular or axillary
adenopathy. The right-sided Port-A-Cath is stable.

CT ABDOMEN PELVIS FINDINGS

Hepatobiliary: No hepatic lesions or intrahepatic biliary
dilatation. The gallbladder is surgically absent. Stable mild
associated common bile duct dilatation.

Pancreas: Moderate pancreatic atrophy but no mass or inflammation.

Spleen: Normal size.  No focal lesions.

Adrenals/Urinary Tract: Adrenal glands and kidneys are unremarkable
and stable. The bladder appears normal.

Stomach/Bowel: The stomach, duodenum, small bowel and colon are
unremarkable. No acute inflammatory changes, mass lesions or
obstructive findings. The terminal ileum and appendix are normal.

Vascular/Lymphatic: Age advanced atherosclerotic calcifications
involving the aorta and iliac arteries but no aneurysm dissection.
The major venous structures are patent. No mesenteric or
retroperitoneal mass or adenopathy. Small scattered lymph nodes are
stable.

Reproductive: The uterus and ovaries are unremarkable.

Other: No pelvic mass or adenopathy. No free pelvic fluid
collections. No inguinal mass or adenopathy. No abdominal wall
hernia or subcutaneous lesions.

Musculoskeletal: No significant bony findings. No lytic or sclerotic
lesions to suggest osseous metastatic disease.
IMPRESSION: 1. No CT findings for recurrent or metastatic disease.
2. Stable emphysematous changes.
3. Age advanced atherosclerotic calcifications involving the
thoracic and abdominal aorta and iliac arteries.
4. Status post cholecystectomy with stable mild associated common
bile duct dilatation.
5. Emphysema and aortic atherosclerosis.

Aortic Atherosclerosis ([ZE]-[ZE]) and Emphysema ([ZE]-[ZE]).

## 2020-02-23 MED ORDER — IOHEXOL 9 MG/ML PO SOLN: 500.0000 mL | ORAL | Status: AC

## 2020-02-23 MED ORDER — IOHEXOL 300 MG/ML  SOLN
100.0000 mL | Freq: Once | INTRAMUSCULAR | Status: AC | PRN
  Administered 2020-02-23: 11:00:00 75 mL via INTRAVENOUS

## 2020-02-23 MED ORDER — IOHEXOL 9 MG/ML PO SOLN
ORAL | Status: AC
  Filled 2020-02-23: qty 1000

## 2020-02-24 ENCOUNTER — Ambulatory Visit: Payer: 59

## 2020-02-24 ENCOUNTER — Encounter: Payer: Self-pay | Admitting: Radiation Oncology

## 2020-02-25 ENCOUNTER — Inpatient Hospital Stay (HOSPITAL_BASED_OUTPATIENT_CLINIC_OR_DEPARTMENT_OTHER): Payer: 59 | Admitting: Internal Medicine

## 2020-02-25 ENCOUNTER — Encounter: Payer: Self-pay | Admitting: Internal Medicine

## 2020-02-25 ENCOUNTER — Other Ambulatory Visit: Payer: Self-pay

## 2020-02-25 ENCOUNTER — Ambulatory Visit: Payer: 59

## 2020-02-25 VITALS — BP 136/92 | HR 81 | Temp 98.2°F | Resp 18 | Ht 64.0 in | Wt 126.0 lb

## 2020-02-25 DIAGNOSIS — C3490 Malignant neoplasm of unspecified part of unspecified bronchus or lung: Secondary | ICD-10-CM | POA: Diagnosis not present

## 2020-02-25 DIAGNOSIS — C7931 Secondary malignant neoplasm of brain: Secondary | ICD-10-CM

## 2020-02-25 DIAGNOSIS — C3432 Malignant neoplasm of lower lobe, left bronchus or lung: Secondary | ICD-10-CM | POA: Diagnosis not present

## 2020-02-25 NOTE — Progress Notes (Signed)
[facility] [phone]   [phone]  OFFICE PROGRESS NOTE  [name], NP [address] [place] [address]  DIAGNOSIS:  1) metastatic small cell lung cancer initially diagnosed as limited stage (T2a, N1, M0) small cell lung cancer presented with right hilar mass in addition to right hilar adenopathy diagnosed in February 2021.  The patient had brain metastasis in June 2021. 2) incidental acute left-sided pulmonary embolism involving lobar, segmental and subsegmental left lower lobe branches diagnosed in April 2021.  PRIOR THERAPY: 1) Systemic chemotherapy with cisplatin 80 mg/M2 on day 1 and etoposide 100 mg/M2 on days 1, 2 and 3 every 3 weeks.  Status post 4 cycles.  This is concurrent with radiotherapy. 2) Xarelto 15 mg p.o. twice daily for the first 3 weeks followed by 20 mg p.o. daily.  Started June 24 2019. 3) whole brain irradiation under the care of Dr. Sondra Come  CURRENT THERAPY: Observation.  INTERVAL HISTORY: Christy Abbott 64 y.o. female returns to the clinic today for follow-up visit.  The patient is feeling fine today with no concerning complaints.  She was found recently to have 2 new subcentimeter lesion in the brain in addition to mild increase in multiple other areas but no vasogenic edema.  She was seen by Dr. Sondra Come and was advised of palliative care.  The patient was seen for a second opinion at [facility] and expected to have gamma knife treatment to some of these lesions tomorrow.  The patient mentioned that she feels the best these days.  She denied having any chest pain, shortness of breath, cough or hemoptysis.  She denied having any fever or chills.  She has no nausea, vomiting, diarrhea or constipation.  She denied having any headache or visual changes.  She had repeat CT scan of the chest, abdomen pelvis performed recently and she is here for evaluation and discussion of her risk her  results.   MEDICAL HISTORY: Past Medical History:  Diagnosis Date  . Arthritis   . Atrial flutter [facility])    s/p CTI by Dr Lovena Le 02/2013  . Chest pain   . Diabetes mellitus without complication ([place])   . Dysrhythmia    hx of Aflutter  . 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 Hemochromatosis associated with compound heterozygous mutation in HFE gene ([place]) 12/10/2017  . Hyperlipidemia   . Hypertension   . Hyperthyroidism 10/31/2014  . lung ca dx'd 03/2019    ALLERGIES:  is allergic to jardiance [empagliflozin] and trazodone and nefazodone.  MEDICATIONS:  Current Outpatient Medications  Medication Sig Dispense Refill  . Calcium Carb-Cholecalciferol (CALTRATE 600+D3 PO) Take 1 tablet by mouth every evening.     . carvedilol (COREG) 12.5 MG tablet Take 1 tablet (12.5 mg total) by mouth in the morning and at bedtime. 180 tablet 3  . Continuous Blood Gluc Sensor (FREESTYLE LIBRE 2 SENSOR) MISC USE TO CHECK BLOOD SUGARS EVERY 14 DAYS. 2 each 3  . diphenhydramine-acetaminophen (TYLENOL PM) 25-500 MG TABS tablet Take 2 tablets by mouth at bedtime.    . flecainide (TAMBOCOR) 50 MG tablet TAKE 1 TABLET BY MOUTH TWICE DAILY, MAY TAKE AN ADDITIONAL 1 TABLET AS NEEDED. 270 tablet 2  . furosemide (LASIX) 20 MG tablet Take 1 tablet (20 mg total) by mouth daily as needed. (Patient not taking: Reported on 02/17/2020) 60 tablet 0  . lisinopril-hydrochlorothiazide (ZESTORETIC) 20-25 MG tablet Take 1  tablet by mouth once daily 90 tablet 0  . LORazepam (ATIVAN) 0.5 MG tablet Place 1 tablet (0.5 mg total) under the tongue every 8 (eight) hours as needed (nausea). (Patient not taking: Reported on 02/17/2020) 30 tablet 0  . magnesium oxide (MAG-OX) 400 (241.3 Mg) MG tablet Take 1 tablet (400 mg total) by mouth 3 (three) times daily. (Patient not taking: Reported on 02/17/2020) 90 tablet 0  . metFORMIN (GLUCOPHAGE) 500 MG tablet Take 1 tab at dinnertime (Patient taking differently: Take 500 mg by  mouth at bedtime. ) 90 tablet 1  . methimazole (TAPAZOLE) 10 MG tablet Take 1 tablet (10 mg total) by mouth daily. 90 tablet 0  . potassium chloride SA (KLOR-CON) 20 MEQ tablet Take 1 tablet (20 mEq total) by mouth daily. 30 tablet 0  . repaglinide (PRANDIN) 1 MG tablet Take 1 tablet (1 mg total) by mouth 2 (two) times daily before a meal. 60 tablet 1  . simvastatin (ZOCOR) 10 MG tablet TAKE 1 TABLET BY MOUTH AT BEDTIME 90 tablet 0  . Tetrahydrozoline HCl (VISINE OP) Place 1 drop into both eyes daily as needed (irritation).     No current facility-administered medications for this visit.    SURGICAL HISTORY:  Past Surgical History:  Procedure Laterality Date  . ABLATION  03-04-2013   CTI by Dr Lovena Le for atrial flutter  . ATRIAL FLUTTER ABLATION N/A 03/04/2013   Procedure: ATRIAL FLUTTER ABLATION;  Surgeon: Evans Lance, MD;  Location: [facility] CATH LAB;  Service: Cardiovascular;  Laterality: N/A;  . CHOLECYSTECTOMY  1982  . COLONOSCOPY W/ POLYPECTOMY     x 2  . ESOPHAGOGASTRODUODENOSCOPY N/A 05/20/2014   Procedure: ESOPHAGOGASTRODUODENOSCOPY (EGD);  Surgeon: Teena Irani, MD;  Location: [facility] END[facility];  Service: Endoscopy;  Laterality: N/A;  . IR IMAGING GUIDED PORT INSERTION  05/09/2019  . TONSILLECTOMY  1972 ?  Marland Kitchen VIDEO BRONCHOSCOPY WITH ENDOBRONCHIAL ULTRASOUND Left 04/30/2019   Procedure: VIDEO BRONCHOSCOPY WITH ENDOBRONCHIAL ULTRASOUND;  Surgeon: Garner Nash, DO;  Location: [facility];  Service: Pulmonary;  Laterality: Left;    REVIEW OF SYSTEMS:  A comprehensive review of systems was negative.   PHYSICAL EXAMINATION: General appearance: alert, cooperative and no distress Head: Normocephalic, without obvious abnormality, atraumatic Neck: no adenopathy, no JVD, supple, symmetrical, trachea midline and thyroid not enlarged, symmetric, no tenderness/mass/nodules Lymph nodes: Cervical, supraclavicular, and axillary nodes normal. Resp: clear to auscultation bilaterally Back: symmetric, no  curvature. ROM normal. No CVA tenderness. Cardio: regular rate and rhythm, S1, S2 normal, no murmur, click, rub or gallop GI: soft, non-tender; bowel sounds normal; no masses,  no organomegaly Extremities: extremities normal, atraumatic, no cyanosis or edema  ECOG PERFORMANCE STATUS: 1 - Symptomatic but completely ambulatory  Blood pressure (!) 136/92, pulse 81, temperature 98.2 F (36.8 C), resp. rate 18, height 5\' 4"  (1.626 m), weight 126 lb (57.2 kg), SpO2 100 %.  LABORATORY DATA: Lab Results  Component Value Date   WBC 5.3 02/23/2020   HGB 12.0 02/23/2020   HCT 34.9 (L) 02/23/2020   MCV 95.1 02/23/2020   PLT 156 02/23/2020      Chemistry      Component Value Date/Time   NA 144 02/23/2020 0916   NA 140 02/21/2018 0955   K 3.3 (L) 02/23/2020 0916   CL 107 02/23/2020 0916   CO2 29 02/23/2020 0916   BUN 15 02/23/2020 0916   BUN 14 02/21/2018 0955   CREATININE 0.81 02/23/2020 0916   CREATININE 1.08 (H) 12/23/2019 1158  Component Value Date/Time   CALCIUM 9.6 02/23/2020 0916   ALKPHOS 96 02/23/2020 0916   AST 13 (L) 02/23/2020 0916   ALT 9 02/23/2020 0916   BILITOT 0.8 02/23/2020 0916       RADIOGRAPHIC STUDIES: CT Chest W Contrast  Result Date: 02/23/2020 CLINICAL DATA:  Restaging small cell lung cancer. EXAM: CT CHEST, ABDOMEN, AND PELVIS WITH CONTRAST TECHNIQUE: Multidetector CT imaging of the chest, abdomen and pelvis was performed following the standard protocol during bolus administration of intravenous contrast. CONTRAST:  32mL OMNIPAQUE IOHEXOL 300 MG/ML  SOLN COMPARISON:  Chest CT 12/12/2019 FINDINGS: CT CHEST FINDINGS Cardiovascular: The heart is normal in size. No pericardial effusion. Stable aortic calcifications but no aneurysm or dissection. Stable coronary artery calcifications. Pulmonary arteries are grossly normal. Mediastinum/Nodes: No mediastinal or hilar mass or lymphadenopathy. Small scattered lymph nodes are stable. The esophagus is grossly  normal. Lungs/Pleura: Stable underlying emphysematous changes. No worrisome pulmonary lesions. No residual ground-glass nodules or solid pulmonary nodules to suggest metastatic disease. No pleural effusions or pleural nodules. Musculoskeletal: No breast masses, supraclavicular or axillary adenopathy. The right-sided Port-A-Cath is stable. CT ABDOMEN PELVIS FINDINGS Hepatobiliary: No hepatic lesions or intrahepatic biliary dilatation. The gallbladder is surgically absent. Stable mild associated common bile duct dilatation. Pancreas: Moderate pancreatic atrophy but no mass or inflammation. Spleen: Normal size.  No focal lesions. Adrenals/Urinary Tract: Adrenal glands and kidneys are unremarkable and stable. The bladder appears normal. Stomach/Bowel: The stomach, duodenum, small bowel and colon are unremarkable. No acute inflammatory changes, mass lesions or obstructive findings. The terminal ileum and appendix are normal. Vascular/Lymphatic: Age advanced atherosclerotic calcifications involving the aorta and iliac arteries but no aneurysm dissection. The major venous structures are patent. No mesenteric or retroperitoneal mass or adenopathy. Small scattered lymph nodes are stable. Reproductive: The uterus and ovaries are unremarkable. Other: No pelvic mass or adenopathy. No free pelvic fluid collections. No inguinal mass or adenopathy. No abdominal wall hernia or subcutaneous lesions. Musculoskeletal: No significant bony findings. No lytic or sclerotic lesions to suggest osseous metastatic disease. IMPRESSION: 1. No CT findings for recurrent or metastatic disease. 2. Stable emphysematous changes. 3. Age advanced atherosclerotic calcifications involving the thoracic and abdominal aorta and iliac arteries. 4. Status post cholecystectomy with stable mild associated common bile duct dilatation. 5. Emphysema and aortic atherosclerosis. Aortic Atherosclerosis (ICD10-I70.0) and Emphysema (ICD10-J43.9). Electronically Signed    By: Marijo Sanes M.D.   On: 02/23/2020 15:36   MR Brain W Wo Contrast  Result Date: 02/13/2020 CLINICAL DATA:  Small cell lung cancer with headaches. EXAM: MRI HEAD WITHOUT AND WITH CONTRAST TECHNIQUE: Multiplanar, multiecho pulse sequences of the brain and surrounding structures were obtained without and with intravenous contrast. CONTRAST:  75mL GADAVIST GADOBUTROL 1 MMOL/ML IV SOLN COMPARISON:  11/20/2019 FINDINGS: BRAIN New Lesions: Left inferior temporal occipital cortex on image 66 where there are two 3 mm lesions. Larger lesions: All of the patient's lesions have increased in size by few mm, many now showing ring enhancement rather than solid nodular enhancement. All measurements are taken on postcontrast axial T1 weighted imaging, series 16: 1. 3 mm in the lateral left cerebellum on image 42 2. Left inferior temporal cortex on image 53 3. 3 mm in the superior right cerebellum on image 55 4. 7 mm in the right frontal operculum on image 86 5. 5 mm in the anterior left frontal cortex on image 95 6. 5 mm in the anterior and superior right frontal cortex on image 114 7. 4 mm  in the superior and anterior left frontal cortex on image 115 8. 9 mm at the high right frontal parietal junction on image 122 9. 6 mm along the high right precentral gyrus on image 130 10. 7 mm along the left frontal cortex at the vertex on image 138. Stable or Smaller lesions: None. Other Brain findings: No incidental infarct, hemorrhage, hydrocephalus, or collection. Scattered vasogenic edema is minimal. Vascular: Unremarkable Skull and upper cervical spine: Unremarkable Sinuses/Orbits: Unremarkable IMPRESSION: Generalized progressive findings. There are 2 new subcentimeter left temporoparietal cortical lesions and all of the pre-existing lesions have increased in size by few millimeters. The largest lesion is 9 mm; no significant vasogenic edema. Electronically Signed   By: Monte Fantasia M.D.   On: 02/13/2020 05:02   CT Abdomen  Pelvis W Contrast  Result Date: 02/23/2020 CLINICAL DATA:  Restaging small cell lung cancer. EXAM: CT CHEST, ABDOMEN, AND PELVIS WITH CONTRAST TECHNIQUE: Multidetector CT imaging of the chest, abdomen and pelvis was performed following the standard protocol during bolus administration of intravenous contrast. CONTRAST:  68mL OMNIPAQUE IOHEXOL 300 MG/ML  SOLN COMPARISON:  Chest CT 12/12/2019 FINDINGS: CT CHEST FINDINGS Cardiovascular: The heart is normal in size. No pericardial effusion. Stable aortic calcifications but no aneurysm or dissection. Stable coronary artery calcifications. Pulmonary arteries are grossly normal. Mediastinum/Nodes: No mediastinal or hilar mass or lymphadenopathy. Small scattered lymph nodes are stable. The esophagus is grossly normal. Lungs/Pleura: Stable underlying emphysematous changes. No worrisome pulmonary lesions. No residual ground-glass nodules or solid pulmonary nodules to suggest metastatic disease. No pleural effusions or pleural nodules. Musculoskeletal: No breast masses, supraclavicular or axillary adenopathy. The right-sided Port-A-Cath is stable. CT ABDOMEN PELVIS FINDINGS Hepatobiliary: No hepatic lesions or intrahepatic biliary dilatation. The gallbladder is surgically absent. Stable mild associated common bile duct dilatation. Pancreas: Moderate pancreatic atrophy but no mass or inflammation. Spleen: Normal size.  No focal lesions. Adrenals/Urinary Tract: Adrenal glands and kidneys are unremarkable and stable. The bladder appears normal. Stomach/Bowel: The stomach, duodenum, small bowel and colon are unremarkable. No acute inflammatory changes, mass lesions or obstructive findings. The terminal ileum and appendix are normal. Vascular/Lymphatic: Age advanced atherosclerotic calcifications involving the aorta and iliac arteries but no aneurysm dissection. The major venous structures are patent. No mesenteric or retroperitoneal mass or adenopathy. Small scattered lymph  nodes are stable. Reproductive: The uterus and ovaries are unremarkable. Other: No pelvic mass or adenopathy. No free pelvic fluid collections. No inguinal mass or adenopathy. No abdominal wall hernia or subcutaneous lesions. Musculoskeletal: No significant bony findings. No lytic or sclerotic lesions to suggest osseous metastatic disease. IMPRESSION: 1. No CT findings for recurrent or metastatic disease. 2. Stable emphysematous changes. 3. Age advanced atherosclerotic calcifications involving the thoracic and abdominal aorta and iliac arteries. 4. Status post cholecystectomy with stable mild associated common bile duct dilatation. 5. Emphysema and aortic atherosclerosis. Aortic Atherosclerosis (ICD10-I70.0) and Emphysema (ICD10-J43.9). Electronically Signed   By: Marijo Sanes M.D.   On: 02/23/2020 15:36    ASSESSMENT AND PLAN: This is a very pleasant 64 years old white female with metastatic small cell lung cancer that was initially diagnosed as limited stage small cell lung cancer and currently undergoing systemic chemotherapy with cisplatin and etoposide concurrent with radiation status post 4 cycles. The patient also received whole brain irradiation under the care of Dr. Sondra Come. The patient is currently on observation and she is feeling fine. Her recent MRI of the brain showed some evidence for disease progression and she is scheduled to have  gamma knife treatment at [facility]. She had repeat CT scan of the chest, abdomen pelvis performed recently.  I personally and independently reviewed the scans and discussed the results with the patient today. Her scan showed no concerning findings for disease progression systemically. I recommended for her to continue on observation with repeat CT scan of the chest, abdomen and pelvis in 4 months. The patient was advised to call immediately if she has any concerning symptoms in the interval. The patient voices understanding of current disease status  and treatment options and is in agreement with the current care plan. All questions were answered. The patient knows to call the clinic with any problems, questions or concerns. We can certainly see the patient much sooner if necessary.  Disclaimer: This note was dictated with voice recognition software. Similar sounding words can inadvertently be transcribed and may not be corrected upon review.

## 2020-02-25 NOTE — Progress Notes (Incomplete)
  Patient Name: Christy Abbott MRN: [id number] DOB: 07-20-1955 Referring Physician: Burney Gauze (Profile Not Attached) Date of Service: 02/24/2020 [facility], [place]                                                        End Of Treatment Note  Diagnoses: C79.31-Secondary malignant neoplasm of brain  Cancer Staging: Stage IIB (cT1cN1M0) limited stage small cell carcinoma of the left lung with brain metastases, now with an additional diffuse recurrence involving the brain after whole brain radiation therapy  Intent: Palliative  Radiation Treatment Dates: 02/19/2020 through 02/19/2020 Site Technique Total Dose (Gy) Dose per Fx (Gy) Completed Fx Beam Energies  Brain: Brain Complex 2/20 2 1/10 6X   Narrative: The patient received a second opinion and is tentatively scheduled for [facility] on 02/26/2020. She only completed one treatment here.  Plan: The patient will follow-up with radiation oncology as needed.  ________________________________________________   Blair Promise, PhD, MD  This document serves as a record of services personally performed by Gery Pray, MD. It was created on his behalf by Clerance Lav, a trained medical scribe. The creation of this record is based on the scribe's personal observations and the provider's statements to them. This document has been checked and approved by the attending provider.

## 2020-02-25 NOTE — Addendum Note (Signed)
Addended by: Ardeen Garland on: 02/25/2020 10:08 AM   Modules accepted: Orders

## 2020-02-26 ENCOUNTER — Ambulatory Visit: Payer: 59

## 2020-02-26 ENCOUNTER — Ambulatory Visit: Payer: Self-pay | Admitting: Radiation Oncology

## 2020-02-27 ENCOUNTER — Telehealth: Payer: Self-pay | Admitting: Internal Medicine

## 2020-02-27 ENCOUNTER — Ambulatory Visit: Payer: 59

## 2020-02-27 NOTE — Telephone Encounter (Signed)
Scheduled per los. Called and left msg. Mailed printout  °

## 2020-03-01 ENCOUNTER — Other Ambulatory Visit: Payer: Self-pay | Admitting: Family

## 2020-03-01 ENCOUNTER — Ambulatory Visit: Payer: 59

## 2020-03-02 ENCOUNTER — Ambulatory Visit: Payer: 59

## 2020-03-03 ENCOUNTER — Ambulatory Visit: Payer: 59

## 2020-03-04 ENCOUNTER — Ambulatory Visit: Payer: 59

## 2020-03-05 ENCOUNTER — Ambulatory Visit: Payer: 59

## 2020-03-08 ENCOUNTER — Ambulatory Visit: Payer: 59

## 2020-03-23 ENCOUNTER — Other Ambulatory Visit: Payer: Self-pay | Admitting: Family

## 2020-03-23 DIAGNOSIS — E876 Hypokalemia: Secondary | ICD-10-CM

## 2020-04-08 ENCOUNTER — Other Ambulatory Visit: Payer: Self-pay | Admitting: Family

## 2020-05-14 ENCOUNTER — Other Ambulatory Visit: Payer: Self-pay | Admitting: Family

## 2020-05-14 DIAGNOSIS — E876 Hypokalemia: Secondary | ICD-10-CM

## 2020-05-17 ENCOUNTER — Other Ambulatory Visit: Payer: 59

## 2020-05-18 ENCOUNTER — Other Ambulatory Visit (INDEPENDENT_AMBULATORY_CARE_PROVIDER_SITE_OTHER): Payer: 59

## 2020-05-18 ENCOUNTER — Other Ambulatory Visit: Payer: Self-pay

## 2020-05-18 DIAGNOSIS — Z794 Long term (current) use of insulin: Secondary | ICD-10-CM | POA: Diagnosis not present

## 2020-05-18 DIAGNOSIS — E0590 Thyrotoxicosis, unspecified without thyrotoxic crisis or storm: Secondary | ICD-10-CM

## 2020-05-18 DIAGNOSIS — E1165 Type 2 diabetes mellitus with hyperglycemia: Secondary | ICD-10-CM

## 2020-05-18 LAB — TSH: TSH: 1.63 u[IU]/mL (ref 0.35–4.50)

## 2020-05-18 LAB — BASIC METABOLIC PANEL
BUN: 18 mg/dL (ref 6–23)
CO2: 34 mEq/L — ABNORMAL HIGH (ref 19–32)
Calcium: 10.6 mg/dL — ABNORMAL HIGH (ref 8.4–10.5)
Chloride: 99 mEq/L (ref 96–112)
Creatinine, Ser: 0.88 mg/dL (ref 0.40–1.20)
GFR: 69.24 mL/min (ref >60.00)
Glucose, Bld: 166 mg/dL — ABNORMAL HIGH (ref 70–99)
Potassium: 3.6 mEq/L (ref 3.5–5.1)
Sodium: 140 mEq/L (ref 135–145)

## 2020-05-18 LAB — HEMOGLOBIN A1C: Hgb A1c MFr Bld: 6.4 % (ref 4.6–6.5)

## 2020-05-18 LAB — T4, FREE: Free T4: 0.99 ng/dL (ref 0.60–1.60)

## 2020-05-20 ENCOUNTER — Ambulatory Visit: Payer: 59 | Admitting: Endocrinology

## 2020-05-22 ENCOUNTER — Other Ambulatory Visit: Payer: Self-pay | Admitting: Interventional Cardiology

## 2020-05-24 ENCOUNTER — Ambulatory Visit: Payer: 59 | Admitting: Endocrinology

## 2020-06-09 ENCOUNTER — Other Ambulatory Visit: Payer: Self-pay | Admitting: Endocrinology

## 2020-06-25 ENCOUNTER — Inpatient Hospital Stay: Payer: 59 | Attending: Internal Medicine

## 2020-06-28 ENCOUNTER — Inpatient Hospital Stay: Payer: 59 | Admitting: Internal Medicine

## 2020-07-05 ENCOUNTER — Other Ambulatory Visit: Payer: Self-pay | Admitting: Family

## 2020-07-06 NOTE — Telephone Encounter (Signed)
Do you want to send to Dr. Dwyane Dee, endo?  Or just refill?

## 2020-07-07 ENCOUNTER — Other Ambulatory Visit: Payer: Self-pay

## 2020-07-07 ENCOUNTER — Other Ambulatory Visit: Payer: Self-pay | Admitting: Endocrinology

## 2020-07-07 ENCOUNTER — Other Ambulatory Visit (INDEPENDENT_AMBULATORY_CARE_PROVIDER_SITE_OTHER): Payer: 59

## 2020-07-07 DIAGNOSIS — E0590 Thyrotoxicosis, unspecified without thyrotoxic crisis or storm: Secondary | ICD-10-CM

## 2020-07-07 DIAGNOSIS — Z794 Long term (current) use of insulin: Secondary | ICD-10-CM | POA: Diagnosis not present

## 2020-07-07 DIAGNOSIS — E1165 Type 2 diabetes mellitus with hyperglycemia: Secondary | ICD-10-CM

## 2020-07-07 LAB — TSH: TSH: 1.46 u[IU]/mL (ref 0.35–4.50)

## 2020-07-07 LAB — COMPREHENSIVE METABOLIC PANEL
ALT: 12 U/L (ref 0–35)
AST: 14 U/L (ref 0–37)
Albumin: 4.2 g/dL (ref 3.5–5.2)
Alkaline Phosphatase: 79 U/L (ref 39–117)
BUN: 22 mg/dL (ref 6–23)
CO2: 31 mEq/L (ref 19–32)
Calcium: 9.6 mg/dL (ref 8.4–10.5)
Chloride: 101 mEq/L (ref 96–112)
Creatinine, Ser: 0.9 mg/dL (ref 0.40–1.20)
GFR: 67.33 mL/min (ref >60.00)
Glucose, Bld: 130 mg/dL — ABNORMAL HIGH (ref 70–99)
Potassium: 4 mEq/L (ref 3.5–5.1)
Sodium: 140 mEq/L (ref 135–145)
Total Bilirubin: 0.6 mg/dL (ref 0.2–1.2)
Total Protein: 6.8 g/dL (ref 6.0–8.3)

## 2020-07-08 LAB — T4, FREE: Free T4: 0.87 ng/dL (ref 0.60–1.60)

## 2020-07-13 ENCOUNTER — Other Ambulatory Visit: Payer: Self-pay | Admitting: Family

## 2020-07-19 ENCOUNTER — Encounter: Payer: Self-pay | Admitting: Endocrinology

## 2020-07-19 ENCOUNTER — Ambulatory Visit (INDEPENDENT_AMBULATORY_CARE_PROVIDER_SITE_OTHER): Payer: 59 | Admitting: Endocrinology

## 2020-07-19 ENCOUNTER — Other Ambulatory Visit: Payer: Self-pay

## 2020-07-19 VITALS — BP 124/80 | HR 79 | Ht 64.0 in | Wt 138.0 lb

## 2020-07-19 DIAGNOSIS — E1165 Type 2 diabetes mellitus with hyperglycemia: Secondary | ICD-10-CM

## 2020-07-19 DIAGNOSIS — E0590 Thyrotoxicosis, unspecified without thyrotoxic crisis or storm: Secondary | ICD-10-CM | POA: Diagnosis not present

## 2020-07-19 DIAGNOSIS — I10 Essential (primary) hypertension: Secondary | ICD-10-CM

## 2020-07-19 NOTE — Progress Notes (Signed)
[name], female   DOB: 26-Dec-1955, 65 y.o.   MRN: [id number]                                                                                                               Reason for Appointment:  Hyperthyroidism and diabetes, follow-up  Referring healthcare provider: [name], NP   Chief complaint: Follow-up   History of Present Illness:   HYPOTHYROIDISM: It is unclear what symptoms the patient had at initial diagnosis but she had a low TSH in 2014 She was referred for endocrinology evaluation but she did not do so because of the cost  Subsequently in 02/2018 she was started on methimazole, at that time she was apparently still not having any symptoms  including palpitations, shakiness, feeling excessively warm and sweaty, weight loss, and fatigue.  Baseline free T4 and T3 levels were normal but her thyrotropin receptor antibody was high She does not think she felt any different with starting methimazole  She was referred for endocrinology consultation and seen in 4/21 She had been prescribed methimazole 15 mg daily since 1/21 when her free T4 was mildly increased She did not feel any different with starting the treatment   She has been told to take methimazole 10 mg daily but 11/2019 she was not taking this regularly She was told to take only 5 mg daily since her thyroid levels were still about the same as on 10 mg  She feels fairly good with no symptoms of palpitations, no fatigue She has gained weight  Thyroid levels are again normal, TSH is still also normal  Previously thyrotropin receptor antibody is 3.44, higher than  in April  Wt Readings from Last 3 Encounters:  07/19/20 138 lb (62.6 kg)  02/25/20 126 lb (57.2 kg)  02/17/20 130 lb 2 oz (59 kg)    Thyroid function tests as follows:     Lab Results  Component Value Date   FREET4 0.87 07/07/2020   FREET4 0.99 05/18/2020   FREET4 0.77 02/11/2020   T3FREE 2.9 06/23/2019   T3FREE 1.6  (L) 03/22/2019   T3FREE 3.5 02/25/2018   TSH 1.46 07/07/2020   TSH 1.63 05/18/2020   TSH 0.55 02/11/2020     Lab Results  Component Value Date   THYROTRECAB 3.44 (H) 12/02/2019   THYROTRECAB 2.72 (H) 06/23/2019   THYROTRECAB 41.3 (H) 03/08/2018   DIABETES management:  Date of diagnosis of type 2 diabetes mellitus:   2015?      Background history:  She has been on Metformin since diagnosis when her diabetes was relatively mild Subsequently Amaryl added After 2018 her blood sugars appear to be progressively worsening with increasing A1c She thinks that sometimes she would forget to take her medications in the morning and blood sugars would be higher for this reason She was admitted for severe hypoglycemia with her sepsis and recommended to be on insulin in 03/2019 Her insulin was stopped in 9/21  Recent history:   Her A1c is 6.4, was 6.3  Non-insulin hypoglycemic drugs the patient is taking are: Metformin 0.5 g once daily, Prandin 1 mg twice daily  Current management, blood sugar patterns and problems identified:  She was supposed to start Prandin with her main meal of the day which is usually lunchtime which she frequently forgets  She says her blood sugars are around 180 after eating and sometimes 200  Blood glucose before lunch was 130  Currently not using her freestyle libre because she was told not to use it when she had her radiological procedures  Fasting readings are still reportedly fairly good with taking only 1 metformin really  She has not done much walking for exercise  She thinks she is eating larger portions now and has gained weight        Side effects from medications have been: None  Typical meal intake: Breakfast is usually eggs and toast, recently may not eat anything Mostly eating 1 meal in the evening         Glucose monitoring:  As above     Blood Glucose readings  from freestyle libre, data previously   PRE-MEAL  overnight  mornings   afternoons  evenings Overall  Glucose range:       Mean/median:  104  1 08  152  124  118    Dietician visit, most recent: Never  Weight history: Wt Readings from Last 3 Encounters:  07/19/20 138 lb (62.6 kg)  02/25/20 126 lb (57.2 kg)  02/17/20 130 lb 2 oz (59 kg)   Lab Results  Component Value Date   HGBA1C 6.4 05/18/2020   HGBA1C 6.3 02/11/2020   HGBA1C 6.4 09/16/2019   Lab Results  Component Value Date   MICROALBUR <0.7 05/10/2015   LDLCALC 50 10/29/2019   CREATININE 0.90 07/07/2020     Allergies as of 07/19/2020      Reactions   Jardiance [empagliflozin] Palpitations   Causes heart palpitations   Trazodone And Nefazodone Other (See Comments)   "hyped me up, could not sleep"      Medication List       Accurate as of Jul 19, 2020 11:59 PM. If you have any questions, ask your nurse or doctor.        CALTRATE 600+D3 PO Take 1 tablet by mouth every evening.   carvedilol 12.5 MG tablet Commonly known as: COREG Take 1 tablet (12.5 mg total) by mouth 2 (two) times daily with a meal. Pt needs to make appt with provider for further refills - 1st attempt   diphenhydramine-acetaminophen 25-500 MG Tabs tablet Commonly known as: TYLENOL PM Take 2 tablets by mouth at bedtime.   flecainide 50 MG tablet Commonly known as: TAMBOCOR TAKE 1 TABLET BY MOUTH TWICE DAILY, MAY TAKE AN ADDITIONAL 1 TABLET AS NEEDED.   FreeStyle Libre 2 Sensor Misc USE TO CHECK BLOOD SUGARS EVERY 14 DAYS.   lisinopril-hydrochlorothiazide 20-25 MG tablet Commonly known as: ZESTORETIC Take 1 tablet by mouth once daily   LORazepam 0.5 MG tablet Commonly known as: Ativan Place 1 tablet (0.5 mg total) under the tongue every 8 (eight) hours as needed (nausea).   metFORMIN 500 MG tablet Commonly known as: GLUCOPHAGE TAKE 1 TABLET BY MOUTH ONCE DAILY AT  DINNERTIME   methimazole 5 MG tablet Commonly known as: TAPAZOLE Take 1 tablet (5 mg total) by mouth daily.   potassium chloride SA 20  MEQ tablet Commonly known as: KLOR-CON Take 1 tablet by mouth once daily   repaglinide 1 MG tablet Commonly  known as: Prandin Take 1 tablet (1 mg total) by mouth 2 (two) times daily before a meal.   simvastatin 10 MG tablet Commonly known as: ZOCOR TAKE 1 TABLET BY MOUTH AT BEDTIME   VISINE OP Place 1 drop into both eyes daily as needed (irritation).           Past Medical History:  Diagnosis Date  . Arthritis   . Atrial flutter [facility])    s/p CTI by Dr Lovena Le 02/2013  . Chest pain   . Diabetes mellitus without complication ([place])   . Dysrhythmia    hx of Aflutter  . 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 Hemochromatosis associated with compound heterozygous mutation in HFE gene ([place]) 12/10/2017  . Hyperlipidemia   . Hypertension   . Hyperthyroidism 10/31/2014  . lung ca dx'd 03/2019    Past Surgical History:  Procedure Laterality Date  . ABLATION  03-04-2013   CTI by Dr Lovena Le for atrial flutter  . ATRIAL FLUTTER ABLATION N/A 03/04/2013   Procedure: ATRIAL FLUTTER ABLATION;  Surgeon: Evans Lance, MD;  Location: [facility] CATH LAB;  Service: Cardiovascular;  Laterality: N/A;  . CHOLECYSTECTOMY  1982  . COLONOSCOPY W/ POLYPECTOMY     x 2  . ESOPHAGOGASTRODUODENOSCOPY N/A 05/20/2014   Procedure: ESOPHAGOGASTRODUODENOSCOPY (EGD);  Surgeon: Teena Irani, MD;  Location: [facility] ENDOSCOPY;  Service: Endoscopy;  Laterality: N/A;  . IR IMAGING GUIDED PORT INSERTION  05/09/2019  . TONSILLECTOMY  1972 ?  Marland Kitchen VIDEO BRONCHOSCOPY WITH ENDOBRONCHIAL ULTRASOUND Left 04/30/2019   Procedure: VIDEO BRONCHOSCOPY WITH ENDOBRONCHIAL ULTRASOUND;  Surgeon: Garner Nash, DO;  Location: [place];  Service: Pulmonary;  Laterality: Left;    Family History  Problem Relation Age of Onset  . Atrial fibrillation Mother   . Arrhythmia Mother   . Diabetes Mother   . Thyroid disease Mother   . Hodgkin's lymphoma Father   . Thyroid disease Daughter        Graves disease  . Diabetes Daughter    . Anxiety disorder Neg Hx     Social History:  reports that she quit smoking about 16 months ago. Her smoking use included cigarettes. She smoked 1.00 pack per day. She has never used smokeless tobacco. She reports that she does not drink alcohol and does not use drugs.  Allergies:  Allergies  Allergen Reactions  . Jardiance [Empagliflozin] Palpitations    Causes heart palpitations  . Trazodone And Nefazodone Other (See Comments)    "hyped me up, could not sleep"     Review of Systems  Hypertension has been treated with Zestoretic 20/25   Lab Results  Component Value Date   K 4.0 07/07/2020     Currently on 10 mg simvastatin for hyperlipidemia from her PCP  Lab Results  Component Value Date   CHOL 121 10/29/2019   CHOL 80 (L) 07/10/2019   CHOL 149 02/21/2018   Lab Results  Component Value Date   HDL 42 10/29/2019   HDL 25 (L) 07/10/2019   HDL 40 02/21/2018   Lab Results  Component Value Date   LDLCALC 50 10/29/2019   LDLCALC 21 07/10/2019   LDLCALC 46 02/21/2018   Lab Results  Component Value Date   TRIG 177 (H) 10/29/2019   TRIG 222 (H) 07/10/2019   TRIG 315 (H) 02/21/2018   Lab Results  Component Value Date   CHOLHDL 2.9 10/29/2019   CHOLHDL 3.7 02/21/2018   CHOLHDL 4 09/04/2016   No  results found for: LDLDIRECT    Examination:   BP 124/80   Pulse 79   Ht 5\' 4"  (1.626 m)   Wt 138 lb (62.6 kg)   SpO2 97%   BMI 23.69 kg/m   Thyroid not palpable   Assessment/Plan:   Hyperthyroidism, from Graves' disease   She has had longstanding subclinical hyperthyroidism for many years presenting mostly as a low TSH  She has been on treatment with 5 mg methimazole recently Thyroid levels are consistently normal Has high thyrotropin receptor antibody as of 9/21 We will continue her on 5 mg methimazole but check her thyrotropin antibody on the next visit, consider a trial without medications   DIABETES type II nonobese:  See history of present  illness for detailed discussion of current diabetes management, blood sugar patterns and problems identified  She has been off her basal bolus insulin regimen since she stopped getting steroids for chemotherapy Currently on Metformin which she is taking only 500 mg along with Prandin 1 mg twice daily  Her A1c has stabilized at 6.4  She did not bring her meter and not clear how often she is checking However she forgets to take Prandin at lunchtime and still appears to have high readings after her main meal at midday Also has gained weight  DIABETES RECOMMENDATIONS:   She will try to be more regular with taking PRANDIN 1 mg before lunchtime Start walking regularly for exercise Needs to cut back on portions to avoid further weight gain  Continue 500 mg Metformin once a day for now    Patient Instructions  Take Prandin at lunch  Walk daily  Check blood sugars on waking up 3-4  days a week  Also check blood sugars about 2 hours after meals and do this after different meals by rotation  Recommended blood sugar levels on waking up are 90-130 and about 2 hours after meal is 130-160  Please bring your blood sugar monitor to each visit, thank you      Elayne Snare 07/20/2020, 9:56 AM    Note: This office note was prepared with Dragon voice recognition system technology. Any transcriptional errors that result from this process are unintentional.

## 2020-07-19 NOTE — Patient Instructions (Addendum)
Take Prandin at lunch  Walk daily  Check blood sugars on waking up 3-4  days a week  Also check blood sugars about 2 hours after meals and do this after different meals by rotation  Recommended blood sugar levels on waking up are 90-130 and about 2 hours after meal is 130-160  Please bring your blood sugar monitor to each visit, thank you

## 2020-08-04 ENCOUNTER — Telehealth: Payer: Self-pay | Admitting: Family

## 2020-08-04 NOTE — Telephone Encounter (Signed)
Pt,daughter Gena would like a return called she did not want to elaborate on the nature of the call she stated that [name] knows what it's about  She can be reach at [phone]  Please advice

## 2020-08-04 NOTE — Telephone Encounter (Signed)
Called daughter back since [name] was already done with seeing patients  for the day and I understood the nature of the call.  Yesterday around lunchtime Gena sent [name] a message about her parents outside of [employer]. Based on the threatening nature of the message I had reached out to risk management and spoken with Marcellus Scott. Barnett Applebaum suggested that maybe we contact the patient and make sure that they were satisfied with the care that they were receiving.  This morning Melissa reached out to Mrs. Sheller to do just that. Melissa shared with Mrs. Deller that her daughter had sent her a message citing some frustrations with the office regarding visits with her and Mr. Everitt.  Mrs. Hanshaw told [name] that her daughter should not have done that and that was not the case at all.  In talking with the daughter today she shared that she was upset and still very angry. She said that Melissa had been filling medications for her mother and not even seen her in the office for a year. I advised that her mother had just been seen in October.  Daughter went on to share that her stepdad had had a CT done on May 5 and never got a phone call with results. The CT resulted on Saturday, May 7. Melissa sent directions to the [facility] on Monday, May 9 at 704 AM. The CMA attempted to contact the patient prior to 746 AM and did have to leave a message. However, at 10:23 that same morning patient was advised of results and the need for a follow up visit which he declined at that time.   Gena said that she had received conflicting information from her mother and stepfather stating that they were looking for a new primary care provider. I told her I would reach out to them or that they could contact us if they would like to change providers. If that's the case, Mrs Beazley could contact the office and we would send the records to the provider of their choosing     Daughter started to understand at the end of the conversation that her  parents were not relaying information correctly. She shared that her stepfather did have some memory issues and that her mother was probably struggling to remember things based on receiving radiology treatment. I shared with her that it may be best for her to come to the visits with her parents. 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 I told her she was always welcome to call the office if she had questions.   At the end of the call, she was very apologetic and asked me to share with Melissa her apologies for sending the message outside of epic and she stated she had in no way meant it to be threatening in nature.

## 2020-08-16 ENCOUNTER — Other Ambulatory Visit: Payer: Self-pay | Admitting: Interventional Cardiology

## 2020-08-21 ENCOUNTER — Other Ambulatory Visit: Payer: Self-pay | Admitting: Family

## 2020-08-23 NOTE — Telephone Encounter (Signed)
Pt called and we have scheduled the follow up appointment and rx has been sent to pharmacy. Pharmacy Verified.

## 2020-08-27 ENCOUNTER — Other Ambulatory Visit: Payer: Self-pay

## 2020-08-27 ENCOUNTER — Encounter: Payer: Self-pay | Admitting: Internal Medicine

## 2020-08-27 ENCOUNTER — Encounter: Payer: Self-pay | Admitting: Family

## 2020-08-27 ENCOUNTER — Ambulatory Visit (INDEPENDENT_AMBULATORY_CARE_PROVIDER_SITE_OTHER): Payer: Medicare Other | Admitting: Family

## 2020-08-27 VITALS — BP 112/72 | HR 74 | Temp 98.7°F | Resp 16 | Wt 132.0 lb

## 2020-08-27 DIAGNOSIS — G4700 Insomnia, unspecified: Secondary | ICD-10-CM | POA: Diagnosis not present

## 2020-08-27 DIAGNOSIS — C3490 Malignant neoplasm of unspecified part of unspecified bronchus or lung: Secondary | ICD-10-CM | POA: Diagnosis not present

## 2020-08-27 DIAGNOSIS — E0590 Thyrotoxicosis, unspecified without thyrotoxic crisis or storm: Secondary | ICD-10-CM | POA: Diagnosis not present

## 2020-08-27 DIAGNOSIS — E119 Type 2 diabetes mellitus without complications: Secondary | ICD-10-CM

## 2020-08-27 DIAGNOSIS — I10 Essential (primary) hypertension: Secondary | ICD-10-CM | POA: Diagnosis not present

## 2020-08-27 DIAGNOSIS — E785 Hyperlipidemia, unspecified: Secondary | ICD-10-CM

## 2020-08-27 DIAGNOSIS — Z23 Encounter for immunization: Secondary | ICD-10-CM

## 2020-08-27 MED ORDER — SHINGRIX 50 MCG/0.5ML IM SUSR: 0.5000 mL | Freq: Once | INTRAMUSCULAR | 1 refills | Status: AC

## 2020-08-27 MED ORDER — LISINOPRIL-HYDROCHLOROTHIAZIDE 20-25 MG PO TABS: 10.5000 | ORAL_TABLET | Freq: Every day | ORAL | 0 refills | Status: DC

## 2020-08-27 NOTE — Assessment & Plan Note (Signed)
BP appears slightly overtreated. Advised pt to cut lisinopril-hctz in 1/2 and take 1/2 tab once daily. Check bp once daily for 1 week and I have requested that she contact me via mychart with her results.  Continue coreg 12.5 bid per cardiology.

## 2020-08-27 NOTE — Assessment & Plan Note (Signed)
Due to her age of 9, I am hesitant to start her on any sedating medications. We discussed good sleep hygiene.

## 2020-08-27 NOTE — Assessment & Plan Note (Addendum)
She has recently completed palliative radiation to the renal met and abdominal wall met.  Management per [facility]/radiation oncology. Overall she is feeling well. She has some trouble staying asleep- we discussed good sleep hygiene habits.   

## 2020-08-27 NOTE — Assessment & Plan Note (Signed)
Lab Results  Component Value Date   TSH 1.46 07/07/2020   TSH stable. management per Endo.

## 2020-08-27 NOTE — Patient Instructions (Addendum)
Please cut lisinopril-hctz in half and take 1/2 tab once daily.

## 2020-08-27 NOTE — Assessment & Plan Note (Signed)
Clinically stable. Management per endocrinology.  Lab Results  Component Value Date   HGBA1C 6.4 05/18/2020

## 2020-08-27 NOTE — Assessment & Plan Note (Addendum)
BP appears slightly overtreated. Advised pt to cut lisinopril-hctz in 1/2 and take 1/2 tab once daily. Check bp once daily for 1 week and I have requested that she contact me via mychart with her results.  Continue coreg 12.5 bid per cardiology.

## 2020-08-27 NOTE — Progress Notes (Signed)
Subjective:   By signing my name below, I, Shehryar Baig, attest that this documentation has been prepared under the direction and in the presence of [name] NP. 08/27/2020     [name], female    DOB: January 14, 1956, 65 y.o.   MRN: [id number]  Chief Complaint  Patient presents with   Diabetes    Here for follow up   Insomnia    Here for follow up    HPI  Patient is in today for a office visit.  Metastatic Small Cell Lung Cancer- states that she is feeling well, "just tired."  She had a kidney biopsy done at [place] on 07/28/20 which revealed poorly differentiated malignant cells consistent with metastatic small cell carcinoma.  She had a biopsy of an abdominal wall mass which noted metastatic small cell carcinoma. She reports that she completed her radiation treatment of both of theses lesions. She has had prior radiation treatments as below:   PRIOR RADIATION TREATMENT:  1. Left lung and mediastinum, 60Gy/57f, 06/2019 ([place]) 2. WBRT, 30 Gy/149f 09/2019 ([place]) 3. WBRT, 3 Gy/42f64f12/9/21, discontinued treatment () 4. GKRS to 11 lesions, 20 Gy[facility]42fx30[facility]16[facility], 20 Gy/42fx,9f24/2022  She complains of insomnia. She feels fatigued and sleeps 2-3 hours a night. She does not drink coffee. She takes tylenol pm and finds that it helps her sleep but doesn't help her stay asleep. She tried melatonin and it was not effective for her.   Dexa- She is not interested in getting a bone density scan until she knows more information of her other health issues.  Mammogram- She is due for a mammogram and is willing to schedule an appointment. Order is active in the system and she knows that she can schedule downstairs in imaging.  Pap Smear- She is due for a pap smear and would like to defer until she gets further information concerning her other health issues. She has no medical history of a hysterectomy. She is  due for a pneumonia vaccine booster.   Immunizations- She is not interested in taking a HIV or hepatitis C screening at this time. She is due for a shingles vaccine and is not on any chemotherapy medications at his time. She has 3 Covid-19 vaccines at this time.   Hyperthyroid- She is managing her thyroid well at this time. She continues taking 5 mg tapazole daily PO. This is being managed by endocrinology.  Vision- She is due for a vision exam.  Hypertension- Her blood pressure is stable at this time. She complains that she moves in slow motion. She does not measure her blood pressure at home. She continues taking 12.5 mg carvedilol daily PO and 20-25 mg lisinopril daily PO.  BP Readings from Last 3 Encounters:  08/27/20 112/72  07/19/20 124/80  02/25/20 (!) 136/92   Blood Sugar- She sees Dr. KumarDwyane Deeanage her blood sugar. She was taken off all her blood sugar medication. She states sugars have been stable.   Lab Results  Component Value Date   HGBA1C 6.4 05/18/2020   Cholesterol- She continues taking 10 mg simvastatin daily PO.  Lab Results  Component Value Date   CHOL 121 10/29/2019   HDL 42 10/29/2019   LDLCALC 50 10/29/2019   TRIG 177 (H) 10/29/2019   CHOLHDL 2.9 10/29/2019    Health Maintenance Due  Topic Date Due   OPHTHALMOLOGY EXAM  Never done   TETANUS/TDAP  Never done   Zoster Vaccines- Shingrix (1 of 2) Never done   MAMMOGRAM  04/25/2017   PNA vac Low Risk Adult (1 of 2 - PCV13) 07/12/2020   COVID-19 Vaccine (4 - Booster for Pfizer series) 08/02/2020    Past Medical History:  Diagnosis Date   Acute on chronic respiratory failure with hypoxemia ([place]) 04/09/2019   Arthritis    Atrial flutter ([place])    s/p CTI by Dr Lovena Le 02/2013   Carcinoid tumor of stomach 06/18/2014   Chest pain    Diabetes mellitus without complication ([place])    Dysrhythmia    hx of Aflutter   Gastric tumor 3/16   1A gastric neuroendocrine tumor   Hemochromatosis associated with  compound heterozygous mutation in HFE gene ([place]) 12/10/2017   Hyperlipidemia    Hypertension    Hyperthyroidism 10/31/2014   lung ca dx'd 03/2019   Sepsis ([place]) 03/22/2019    Past Surgical History:  Procedure Laterality Date   ABLATION  03-04-2013   CTI by Dr Lovena Le for atrial flutter   ATRIAL FLUTTER ABLATION N/A 03/04/2013   Procedure: ATRIAL FLUTTER ABLATION;  Surgeon: Evans Lance, MD;  Location: [facility] CATH LAB;  Service: Cardiovascular;  Laterality: N/A;   CHOLECYSTECTOMY  1982   COLONOSCOPY W/ POLYPECTOMY     x 2   ESOPHAGOGASTRODUODENOSCOPY N/A 05/20/2014   Procedure: ESOPHAGOGASTRODUODENOSCOPY (EGD);  Surgeon: Teena Irani, MD;  Location: [facility] END[facility]COPY;  Service: Endoscopy;  Laterality: N/A;   IR IMAGING GUIDED PORT INSERTION  05/09/2019   TONSILLECTOMY  1972 ?   VIDEO BRONCHOSCOPY WITH ENDOBRONCHIAL ULTRASOUND Left 04/30/2019   Procedure: VIDEO BRONCHOSCOPY WITH ENDOBRONCHIAL ULTRASOUND;  Surgeon: Garner Nash, DO;  Location: [place];  Service: Pulmonary;  Laterality: Left;    Family History  Problem Relation Age of Onset   Atrial fibrillation Mother    Arrhythmia Mother    Diabetes Mother    Thyroid disease Mother    Hodgkin's lymphoma Father    Thyroid disease Daughter        Graves disease   Diabetes Daughter    Anxiety disorder Neg Hx     Social History   Socioeconomic History   Marital status: Married    Spouse name: Not on file   Number of children: Not on file   Years of education: Not on file   Highest education level: Not on file  Occupational History   Not on file  Tobacco Use   Smoking status: Former    Packs/day: 1.00    Pack years: 0.00    Types: Cigarettes    Quit date: 03/21/2019    Years since quitting: 1.4   Smokeless tobacco: Never   Tobacco comments:    started age 46 , quit for a year many times  Vaping Use   Vaping Use: Never used  Substance and Sexual Activity   Alcohol use: No    Alcohol/week: 0.0 standard drinks   Drug use: No    Sexual activity: Not on file  Other Topics Concern   Not on file  Social History Narrative   Married   Clinical cytogeneticist- full time   Daughter- 2 grandchildren, live in [place]   Enjoys playing on computer.     Social Determinants of Health   Financial Resource Strain: Not on file  Food Insecurity: Not on file  Transportation Needs: Not on file  Physical Activity: Not on file  Stress: Not on file  Social Connections: Not on file  Intimate Partner  Violence: Not on file    Outpatient Medications Prior to Visit  Medication Sig Dispense Refill   Calcium Carb-Cholecalciferol (CALTRATE 600+D3 PO) Take 1 tablet by mouth every evening.      carvedilol (COREG) 12.5 MG tablet Take 1 tablet (12.5 mg total) by mouth 2 (two) times daily with a meal. Patient needs appointment for any future refills. Please call office at [phone] to schedule appointment 2nd attempt. 30 tablet 0   Continuous Blood Gluc Sensor (FREESTYLE LIBRE 2 SENSOR) MISC USE TO CHECK BLOOD SUGARS EVERY 14 DAYS. 2 each 3   diphenhydramine-acetaminophen (TYLENOL PM) 25-500 MG TABS tablet Take 2 tablets by mouth at bedtime.     flecainide (TAMBOCOR) 50 MG tablet TAKE 1 TABLET BY MOUTH TWICE DAILY, MAY TAKE AN ADDITIONAL 1 TABLET AS NEEDED. 270 tablet 2   metFORMIN (GLUCOPHAGE) 500 MG tablet TAKE 1 TABLET BY MOUTH ONCE DAILY AT  DINNERTIME 90 tablet 0   methimazole (TAPAZOLE) 5 MG tablet Take 1 tablet (5 mg total) by mouth daily. 30 tablet 2   potassium chloride SA (KLOR-CON) 20 MEQ tablet Take 1 tablet by mouth once daily 90 tablet 0   repaglinide (PRANDIN) 1 MG tablet Take 1 tablet (1 mg total) by mouth 2 (two) times daily before a meal. 60 tablet 1   simvastatin (ZOCOR) 10 MG tablet TAKE 1 TABLET BY MOUTH AT BEDTIME 30 tablet 0   lisinopril-hydrochlorothiazide (ZESTORETIC) 20-25 MG tablet Take 1 tablet by mouth once daily 90 tablet 0   LORazepam (ATIVAN) 0.5 MG tablet Place 1 tablet (0.5 mg total) under the tongue every 8 (eight)  hours as needed (nausea). (Patient not taking: No sig reported) 30 tablet 0   Tetrahydrozoline HCl (VISINE OP) Place 1 drop into both eyes daily as needed (irritation).     No facility-administered medications prior to visit.    Allergies  Allergen Reactions   Jardiance [Empagliflozin] Palpitations    Causes heart palpitations   Trazodone And Nefazodone Other (See Comments)    "hyped me up, could not sleep"    Review of Systems  Psychiatric/Behavioral:  The patient has insomnia.       Objective:    Physical Exam Constitutional:      General: She is not in acute distress.    Appearance: Normal appearance. She is not ill-appearing.  HENT:     Head: Normocephalic and atraumatic.     Right Ear: External ear normal.     Left Ear: External ear normal.  Eyes:     Extraocular Movements: Extraocular movements intact.     Pupils: Pupils are equal, round, and reactive to light.  Cardiovascular:     Rate and Rhythm: Normal rate and regular rhythm.     Pulses: Normal pulses.          Dorsalis pedis pulses are 2+ on the right side and 2+ on the left side.       Posterior tibial pulses are 2+ on the right side and 2+ on the left side.     Heart sounds: Normal heart sounds. No murmur heard.   No gallop.  Pulmonary:     Effort: Pulmonary effort is normal. No respiratory distress.     Breath sounds: Normal breath sounds. No wheezing, rhonchi or rales.  Feet:     Comments: Diabetic Foot Exam - Simple   Simple Foot Form Diabetic Foot exam was performed with the following findings: Yes  08/27/2020  3:26 PM  Visual Inspection No deformities, no  ulcerations, no other skin breakdown bilaterally: Yes Sensation Testing Intact to touch and monofilament testing bilaterally: Yes Pulse Check Posterior Tibialis and Dorsalis pulse intact bilaterally: Yes Comments    Skin:    General: Skin is warm and dry.  Neurological:     Mental Status: She is alert and oriented to person, place, and time.   Psychiatric:        Behavior: Behavior normal.    BP 112/72 (BP Location: Right Arm, Patient Position: Sitting, Cuff Size: Small)   Pulse 74   Temp 98.7 F (37.1 C) (Oral)   Resp 16   Wt 132 lb (59.9 kg)   SpO2 99%   BMI 22.66 kg/m  Wt Readings from Last 3 Encounters:  08/27/20 132 lb (59.9 kg)  07/19/20 138 lb (62.6 kg)  02/25/20 126 lb (57.2 kg)       Assessment & Plan:   Problem List Items Addressed This Visit       Unprioritized   Small cell lung cancer ([place])    She has recently completed palliative radiation to the renal met and abdominal wall met.  Management per [facility] oncology/radiation oncology. Overall she is feeling well. She has some trouble staying asleep- we discussed good sleep hygiene habits.         Insomnia    Due to her age of 62, I am hesitant to start her on any sedating medications. We discussed good sleep hygiene.        Hyperthyroidism    Lab Results  Component Value Date   TSH 1.46 07/07/2020  TSH stable. management per Endo.        HTN (hypertension)    BP appears slightly overtreated. Advised pt to cut lisinopril-hctz in 1/2 and take 1/2 tab once daily. Check bp once daily for 1 week and I have requested that she contact me via mychart with her results.  Continue coreg 12.5 bid per cardiology.       Relevant Medications   lisinopril-hydrochlorothiazide (ZESTORETIC) 20-25 MG tablet   Diabetes type 2, controlled ([place]) - Primary    Clinically stable. Management per endocrinology.  Lab Results  Component Value Date   HGBA1C 6.4 05/18/2020         Relevant Medications   lisinopril-hydrochlorothiazide (ZESTORETIC) 20-25 MG tablet   Other Relevant Orders   Ambulatory referral to Ophthalmology   Basic metabolic panel   Other Visit Diagnoses     Hyperlipidemia, unspecified hyperlipidemia type       Relevant Medications   lisinopril-hydrochlorothiazide (ZESTORETIC) 20-25 MG tablet   Other Relevant Orders   Lipid panel   Need  for pneumococcal vaccine       Relevant Orders   Pneumococcal conjugate vaccine 20-valent (Prevnar-20) (Completed)        Meds ordered this encounter  Medications   lisinopril-hydrochlorothiazide (ZESTORETIC) 20-25 MG tablet    Sig: Take 10.5 tablets by mouth daily.    Dispense:  90 tablet    Refill:  0    Order Specific Question:   Supervising Provider    Answer:   Penni Homans A [[id number]]   Zoster Vaccine Adjuvanted [facility]) injection    Sig: Inject 0.5 mLs into the muscle once for 1 dose.    Dispense:  0.5 mL    Refill:  1    Order Specific Question:   Supervising Provider    Answer:   Penni Homans A [[id number]]    I, [name] NP, personally preformed the services described in this  documentation.  All medical record entries made by the scribe were at my direction and in my presence.  I have reviewed the chart and discharge instructions (if applicable) and agree that the record reflects my personal performance and is accurate and complete. 08/27/2020  50 minutes spent on today's encounter. Time was spent reviewing outside records, interviewing/examining patient.   I,Shehryar Multimedia programmer as a Education administrator for [employer], NP.,have documented all relevant documentation on the behalf of Nance Pear, NP,as directed by  Nance Pear, NP while in the presence of Nance Pear, NP.   Nance Pear, NP

## 2020-08-30 ENCOUNTER — Telehealth: Payer: Self-pay | Admitting: Family

## 2020-08-30 ENCOUNTER — Other Ambulatory Visit: Payer: Self-pay | Admitting: Family

## 2020-08-30 ENCOUNTER — Other Ambulatory Visit: Payer: Self-pay | Admitting: Interventional Cardiology

## 2020-08-30 NOTE — Addendum Note (Signed)
Addended by: Jiles Prows on: 08/30/2020 09:15 AM   Modules accepted: Orders

## 2020-08-30 NOTE — Telephone Encounter (Signed)
Patient was scheduled for labs tomorrow

## 2020-08-30 NOTE — Telephone Encounter (Signed)
Looks like she forgot to stop by the lab.  Please schedule lab visit.

## 2020-08-31 ENCOUNTER — Other Ambulatory Visit: Payer: Self-pay

## 2020-08-31 ENCOUNTER — Other Ambulatory Visit (INDEPENDENT_AMBULATORY_CARE_PROVIDER_SITE_OTHER): Payer: Medicare Other

## 2020-08-31 DIAGNOSIS — E785 Hyperlipidemia, unspecified: Secondary | ICD-10-CM

## 2020-08-31 DIAGNOSIS — E119 Type 2 diabetes mellitus without complications: Secondary | ICD-10-CM

## 2020-09-01 ENCOUNTER — Encounter: Payer: Self-pay | Admitting: Internal Medicine

## 2020-09-01 LAB — BASIC METABOLIC PANEL
BUN: 22 mg/dL (ref 7–25)
CO2: 28 mmol/L (ref 20–32)
Calcium: 9.8 mg/dL (ref 8.6–10.4)
Chloride: 103 mmol/L (ref 98–110)
Creat: 0.76 mg/dL (ref 0.50–0.99)
Glucose, Bld: 132 mg/dL — ABNORMAL HIGH (ref 65–99)
Potassium: 4 mmol/L (ref 3.5–5.3)
Sodium: 139 mmol/L (ref 135–146)

## 2020-09-01 LAB — LIPID PANEL
Cholesterol: 151 mg/dL (ref <200)
HDL: 54 mg/dL (ref >=50)
LDL Cholesterol (Calc): 73 mg/dL (calc)
Non-HDL Cholesterol (Calc): 97 mg/dL (calc) (ref <130)
Total CHOL/HDL Ratio: 2.8 (calc) (ref <5.0)
Triglycerides: 162 mg/dL — ABNORMAL HIGH (ref <150)

## 2020-09-08 ENCOUNTER — Other Ambulatory Visit: Payer: Self-pay | Admitting: Endocrinology

## 2020-09-08 DIAGNOSIS — C801 Malignant (primary) neoplasm, unspecified: Secondary | ICD-10-CM | POA: Diagnosis not present

## 2020-09-08 DIAGNOSIS — C7931 Secondary malignant neoplasm of brain: Secondary | ICD-10-CM | POA: Diagnosis not present

## 2020-09-16 ENCOUNTER — Telehealth: Payer: Self-pay | Admitting: Pharmacy Technician

## 2020-09-16 ENCOUNTER — Other Ambulatory Visit: Payer: Self-pay | Admitting: Interventional Cardiology

## 2020-09-16 NOTE — Telephone Encounter (Signed)
Patient Advocate Encounter   Received notification from [facility] that prior authorization for FREESTYLE LIBRE 2 SENSOR is required.   PA submitted on 09/16/2020 Key [id number] Status is pending    [facility] will continue to follow.   Venida Jarvis. Nadara Mustard, CPhT Patient Advocate [facility] Phone: [phone] Fax:  [phone]

## 2020-09-16 NOTE — Telephone Encounter (Signed)
This product, Freestyle Libre Kit 2 Sensor is covered under Medicare part B.  The prescription must be sent to [facility] at [phone].  Provider messaged.  Venida Jarvis. Nadara Mustard, CPhT Patient Advocate [facility] Phone: [phone] Fax:  [phone]

## 2020-09-20 ENCOUNTER — Other Ambulatory Visit: Payer: Self-pay | Admitting: Family

## 2020-09-21 DIAGNOSIS — C7931 Secondary malignant neoplasm of brain: Secondary | ICD-10-CM | POA: Diagnosis not present

## 2020-09-22 DIAGNOSIS — C7931 Secondary malignant neoplasm of brain: Secondary | ICD-10-CM | POA: Diagnosis not present

## 2020-09-26 ENCOUNTER — Other Ambulatory Visit: Payer: Self-pay | Admitting: Endocrinology

## 2020-09-26 ENCOUNTER — Other Ambulatory Visit: Payer: Self-pay | Admitting: Interventional Cardiology

## 2020-09-27 DIAGNOSIS — C3491 Malignant neoplasm of unspecified part of right bronchus or lung: Secondary | ICD-10-CM | POA: Diagnosis not present

## 2020-09-27 DIAGNOSIS — C7989 Secondary malignant neoplasm of other specified sites: Secondary | ICD-10-CM | POA: Diagnosis not present

## 2020-09-27 DIAGNOSIS — C7931 Secondary malignant neoplasm of brain: Secondary | ICD-10-CM | POA: Diagnosis not present

## 2020-09-27 DIAGNOSIS — Z79899 Other long term (current) drug therapy: Secondary | ICD-10-CM | POA: Diagnosis not present

## 2020-09-27 DIAGNOSIS — N2889 Other specified disorders of kidney and ureter: Secondary | ICD-10-CM | POA: Diagnosis not present

## 2020-09-27 DIAGNOSIS — Z87891 Personal history of nicotine dependence: Secondary | ICD-10-CM | POA: Diagnosis not present

## 2020-09-27 DIAGNOSIS — R918 Other nonspecific abnormal finding of lung field: Secondary | ICD-10-CM | POA: Diagnosis not present

## 2020-09-29 DIAGNOSIS — C7931 Secondary malignant neoplasm of brain: Secondary | ICD-10-CM | POA: Diagnosis not present

## 2020-10-04 ENCOUNTER — Other Ambulatory Visit: Payer: Self-pay | Admitting: Family

## 2020-10-04 DIAGNOSIS — E876 Hypokalemia: Secondary | ICD-10-CM

## 2020-10-12 ENCOUNTER — Other Ambulatory Visit: Payer: Self-pay

## 2020-10-12 MED ORDER — SIMVASTATIN 10 MG PO TABS: 10.0000 mg | ORAL_TABLET | Freq: Every day | ORAL | 0 refills | Status: DC

## 2020-10-15 ENCOUNTER — Other Ambulatory Visit: Payer: 59

## 2020-10-18 ENCOUNTER — Other Ambulatory Visit: Payer: Medicare Other

## 2020-10-18 ENCOUNTER — Other Ambulatory Visit (INDEPENDENT_AMBULATORY_CARE_PROVIDER_SITE_OTHER): Payer: Medicare Other

## 2020-10-18 ENCOUNTER — Other Ambulatory Visit: Payer: Self-pay

## 2020-10-18 DIAGNOSIS — E1165 Type 2 diabetes mellitus with hyperglycemia: Secondary | ICD-10-CM | POA: Diagnosis not present

## 2020-10-18 DIAGNOSIS — E0590 Thyrotoxicosis, unspecified without thyrotoxic crisis or storm: Secondary | ICD-10-CM

## 2020-10-18 LAB — BASIC METABOLIC PANEL
BUN: 22 mg/dL (ref 6–23)
CO2: 31 mEq/L (ref 19–32)
Calcium: 10 mg/dL (ref 8.4–10.5)
Chloride: 102 mEq/L (ref 96–112)
Creatinine, Ser: 0.95 mg/dL (ref 0.40–1.20)
GFR: 62.98 mL/min (ref >60.00)
Glucose, Bld: 203 mg/dL — ABNORMAL HIGH (ref 70–99)
Potassium: 4.1 mEq/L (ref 3.5–5.1)
Sodium: 141 mEq/L (ref 135–145)

## 2020-10-18 LAB — MICROALBUMIN / CREATININE URINE RATIO
Creatinine,U: 84.9 mg/dL
Microalb Creat Ratio: 1.5 mg/g (ref 0.0–30.0)
Microalb, Ur: 1.2 mg/dL (ref 0.0–1.9)

## 2020-10-18 LAB — TSH: TSH: 1 u[IU]/mL (ref 0.35–5.50)

## 2020-10-18 LAB — T4, FREE: Free T4: 0.93 ng/dL (ref 0.60–1.60)

## 2020-10-18 LAB — HEMOGLOBIN A1C: Hgb A1c MFr Bld: 8.9 % — ABNORMAL HIGH (ref 4.6–6.5)

## 2020-10-19 ENCOUNTER — Ambulatory Visit: Payer: Self-pay | Admitting: Endocrinology

## 2020-10-19 LAB — THYROTROPIN RECEPTOR AUTOABS: Thyrotropin Receptor Ab: 2.61 IU/L — ABNORMAL HIGH (ref 0.00–1.75)

## 2020-10-20 NOTE — Progress Notes (Deleted)
[name], female   DOB: 1956/02/09, 65 y.o.   MRN: [id number]                                                                                                               Reason for Appointment:  Hyperthyroidism and diabetes, follow-up  Referring healthcare provider: [name], NP   Chief complaint: Follow-up   History of Present Illness:   HYPOTHYROIDISM: It is unclear what symptoms the patient had at initial diagnosis but she had a low TSH in 2014 She was referred for endocrinology evaluation but she did not do so because of the cost  Subsequently in 02/2018 she was started on methimazole, at that time she was apparently still not having any symptoms  including palpitations, shakiness, feeling excessively warm and sweaty, weight loss, and fatigue.  Baseline free T4 and T3 levels were normal but her thyrotropin receptor antibody was high She does not think she felt any different with starting methimazole  She was referred for endocrinology consultation and seen in 4/21 She had been prescribed methimazole 15 mg daily since 1/21 when her free T4 was mildly increased She did not feel any different with starting the treatment   She has been told to take methimazole 10 mg daily but 11/2019 she was not taking this regularly She was told to take only 5 mg daily since her thyroid levels were still about the same as on 10 mg  She feels fairly good with no symptoms of palpitations, no fatigue She has gained weight  Thyroid levels are again normal, TSH is still also normal  Previously thyrotropin receptor antibody is 3.44, higher than  in April  Wt Readings from Last 3 Encounters:  08/27/20 132 lb (59.9 kg)  07/19/20 138 lb (62.6 kg)  02/25/20 126 lb (57.2 kg)    Thyroid function tests as follows:     Lab Results  Component Value Date   FREET4 0.93 10/18/2020   FREET4 0.87 07/07/2020   FREET4 0.99 05/18/2020   T3FREE 2.9 06/23/2019   T3FREE 1.6 (L)  03/22/2019   T3FREE 3.5 02/25/2018   TSH 1.00 10/18/2020   TSH 1.46 07/07/2020   TSH 1.63 05/18/2020     Lab Results  Component Value Date   THYROTRECAB 2.61 (H) 10/18/2020   THYROTRECAB 3.44 (H) 12/02/2019   THYROTRECAB 2.72 (H) 06/23/2019   DIABETES management:  Date of diagnosis of type 2 diabetes mellitus:   2015?      Background history:  She has been on Metformin since diagnosis when her diabetes was relatively mild Subsequently Amaryl added After 2018 her blood sugars appear to be progressively worsening with increasing A1c She thinks that sometimes she would forget to take her medications in the morning and blood sugars would be higher for this reason She was admitted for severe hypoglycemia with her sepsis and recommended to be on insulin in 03/2019 Her insulin was stopped in 9/21  Recent history:   Her A1c is 6.4, was 6.3  Non-insulin hypoglycemic  drugs the patient is taking are: Metformin 0.5 g once daily, Prandin 1 mg twice daily  Current management, blood sugar patterns and problems identified: She was supposed to start Prandin with her main meal of the day which is usually lunchtime which she frequently forgets She says her blood sugars are around 180 after eating and sometimes 200 Blood glucose before lunch was 130 Currently not using her freestyle libre because she was told not to use it when she had her radiological procedures Fasting readings are still reportedly fairly good with taking only 1 metformin really She has not done much walking for exercise She thinks she is eating larger portions now and has gained weight        Side effects from medications have been: None  Typical meal intake: Breakfast is usually eggs and toast, recently may not eat anything Mostly eating 1 meal in the evening         Glucose monitoring:  As above     Blood Glucose readings  from freestyle libre, data previously   PRE-MEAL  overnight  mornings  afternoons   evenings Overall  Glucose range:       Mean/median:  104  1 08  152  124  118    Dietician visit, most recent: Never  Weight history: Wt Readings from Last 3 Encounters:  08/27/20 132 lb (59.9 kg)  07/19/20 138 lb (62.6 kg)  02/25/20 126 lb (57.2 kg)   Lab Results  Component Value Date   HGBA1C 8.9 (H) 10/18/2020   HGBA1C 6.4 05/18/2020   HGBA1C 6.3 02/11/2020   Lab Results  Component Value Date   MICROALBUR 1.2 10/18/2020   LDLCALC 73 08/31/2020   CREATININE 0.95 10/18/2020     Allergies as of 10/21/2020       Reactions   Jardiance [empagliflozin] Palpitations   Causes heart palpitations   Trazodone And Nefazodone Other (See Comments)   "hyped me up, could not sleep"        Medication List        Accurate as of October 20, 2020  9:04 PM. If you have any questions, ask your nurse or doctor.          CALTRATE 600+D3 PO Take 1 tablet by mouth every evening.   carvedilol 12.5 MG tablet Commonly known as: COREG TAKE 1 TABLET BY MOUTH IN THE MORNING AND AT BEDTIME . APPOINTMENT REQUIRED FOR FUTURE REFILLS   diphenhydramine-acetaminophen 25-500 MG Tabs tablet Commonly known as: TYLENOL PM Take 2 tablets by mouth at bedtime.   flecainide 50 MG tablet Commonly known as: TAMBOCOR TAKE 1 TABLET BY MOUTH TWICE DAILY, MAY TAKE AN ADDITIONAL 1 TABLET AS NEEDED.   FreeStyle Libre 2 Sensor Misc USE TO CHECK BLOOD SUGAR EVERY 14 DAYS   lisinopril-hydrochlorothiazide 20-25 MG tablet Commonly known as: ZESTORETIC Take 10.5 tablets by mouth daily.   metFORMIN 500 MG tablet Commonly known as: GLUCOPHAGE TAKE 1 TABLET BY MOUTH ONCE DAILY AT  DINNERTIME   methimazole 5 MG tablet Commonly known as: TAPAZOLE Take 1 tablet (5 mg total) by mouth daily.   potassium chloride SA 20 MEQ tablet Commonly known as: KLOR-CON Take 1 tablet by mouth once daily   repaglinide 1 MG tablet Commonly known as: Prandin Take 1 tablet (1 mg total) by mouth 2 (two) times daily  before a meal.   simvastatin 10 MG tablet Commonly known as: ZOCOR Take 1 tablet (10 mg total) by mouth at bedtime.  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   Hemochromatosis associated with compound heterozygous mutation in HFE gene ([place]) 12/10/2017   Hyperlipidemia    Hypertension    Hyperthyroidism 10/31/2014   lung ca dx'd 03/2019   Sepsis ([place]) 03/22/2019    Past Surgical History:  Procedure Laterality Date   ABLATION  03-04-2013   CTI by Dr Lovena Le for atrial flutter   ATRIAL FLUTTER ABLATION N/A 03/04/2013   Procedure: ATRIAL FLUTTER ABLATION;  Surgeon: Evans Lance, MD;  Location: [facility] CATH LAB;  Service: Cardiovascular;  Laterality: N/A;   CHOLECYSTECTOMY  1982   COLONOSCOPY W/ POLYPECTOMY     x 2   ESOPHAGOGASTRODUODENOSCOPY N/A 05/20/2014   Procedure: ESOPHAGOGASTRODUODENOSCOPY (EGD);  Surgeon: Teena Irani, MD;  Location: [facility];  Service: Endoscopy;  Laterality: N/A;   IR IMAGING GUIDED PORT INSERTION  05/09/2019   TONSILLECTOMY  1972 ?   VIDEO BRONCHOSCOPY WITH ENDOBRONCHIAL ULTRASOUND Left 04/30/2019   Procedure: VIDEO BRONCHOSCOPY WITH ENDOBRONCHIAL ULTRASOUND;  Surgeon: Garner Nash, DO;  Location: [facility];  Service: Pulmonary;  Laterality: Left;    Family History  Problem Relation Age of Onset   Atrial fibrillation Mother    Arrhythmia Mother    Diabetes Mother    Thyroid disease Mother    Hodgkin's lymphoma Father    Thyroid disease Daughter        Graves disease   Diabetes Daughter    Anxiety disorder Neg Hx     Social History:  reports that she quit smoking about 19 months ago. Her smoking use included  cigarettes. She smoked an average of 1 pack per day. She has never used smokeless tobacco. She reports that she does not drink alcohol and does not use drugs.  Allergies:  Allergies  Allergen Reactions   Jardiance [Empagliflozin] Palpitations    Causes heart palpitations   Trazodone And Nefazodone Other (See Comments)    "hyped me up, could not sleep"     Review of Systems  Hypertension has been treated with Zestoretic 20/25   Lab Results  Component Value Date   K 4.1 10/18/2020     Currently on 10 mg simvastatin for hyperlipidemia from her PCP  Lab Results  Component Value Date   CHOL 151 08/31/2020   CHOL 121 10/29/2019   CHOL 80 (L) 07/10/2019   Lab Results  Component Value Date   HDL 54 08/31/2020   HDL 42 10/29/2019   HDL 25 (L) 07/10/2019   Lab Results  Component Value Date   LDLCALC 73 08/31/2020   LDLCALC 50 10/29/2019   LDLCALC 21 07/10/2019   Lab Results  Component Value Date   TRIG 162 (H) 08/31/2020   TRIG 177 (H) 10/29/2019   TRIG 222 (H) 07/10/2019   Lab Results  Component Value Date   CHOLHDL 2.8 08/31/2020   CHOLHDL 2.9 10/29/2019   CHOLHDL 3.7 02/21/2018   No results found for: LDLDIRECT    Examination:   There were no vitals taken for this visit.  Thyroid not palpable   Assessment/Plan:   Hyperthyroidism, from Graves' disease  She has had longstanding subclinical hyperthyroidism for many years presenting mostly as a low TSH  She has been on treatment with 5 mg methimazole recently Thyroid levels are consistently normal Has high thyrotropin receptor antibody as of 9/21 We will continue her on 5 mg methimazole but check her thyrotropin antibody on the next visit, consider a trial without medications   DIABETES type II nonobese:  See history of present illness for detailed discussion of current diabetes management, blood sugar patterns and problems identified  She has been off her basal bolus insulin regimen since she  stopped getting steroids for chemotherapy Currently on Metformin which she is taking only 500 mg along with Prandin 1 mg twice daily  Her A1c has stabilized at 6.4  She did not bring her meter and not clear how often she is checking However she forgets to take Prandin at lunchtime and still appears to have high readings after her main meal at midday Also has gained weight  DIABETES RECOMMENDATIONS:   She will try to be more regular with taking PRANDIN 1 mg before lunchtime Start walking regularly for exercise Needs to cut back on portions to avoid further weight gain  Continue 500 mg Metformin once a day for now    There are no Patient Instructions on file for this visit.    Elayne Snare 10/20/2020, 9:04 PM    Note: This office note was prepared with Dragon voice recognition system technology. Any transcriptional errors that result from this process are unintentional.

## 2020-10-21 ENCOUNTER — Ambulatory Visit: Payer: Self-pay | Admitting: Endocrinology

## 2020-10-22 ENCOUNTER — Other Ambulatory Visit: Payer: Self-pay | Admitting: Interventional Cardiology

## 2020-10-22 DIAGNOSIS — R002 Palpitations: Secondary | ICD-10-CM

## 2020-11-02 DIAGNOSIS — E119 Type 2 diabetes mellitus without complications: Secondary | ICD-10-CM | POA: Diagnosis not present

## 2020-11-02 LAB — HM DIABETES EYE EXAM

## 2020-11-12 ENCOUNTER — Ambulatory Visit: Payer: Medicare Other | Admitting: Endocrinology

## 2020-11-12 ENCOUNTER — Telehealth: Payer: Self-pay | Admitting: Interventional Cardiology

## 2020-11-12 MED ORDER — CARVEDILOL 12.5 MG PO TABS: ORAL_TABLET | ORAL | 1 refills | Status: DC

## 2020-11-12 NOTE — Telephone Encounter (Signed)
Pt's medication was sent to pt's pharmacy as requested. Confirmation received.  °

## 2020-11-12 NOTE — Progress Notes (Unsigned)
[name], female   DOB: 05/03/1955, 65 y.o.   MRN: [id number]                                                                                                               Reason for Appointment:  Hyperthyroidism and diabetes, follow-up  Referring healthcare provider: [name], NP   Chief complaint: Follow-up   History of Present Illness:   HYPOTHYROIDISM: It is unclear what symptoms the patient had at initial diagnosis but she had a low TSH in 2014 She was referred for endocrinology evaluation but she did not do so because of the cost  Subsequently in 02/2018 she was started on methimazole, at that time she was apparently still not having any symptoms  including palpitations, shakiness, feeling excessively warm and sweaty, weight loss, and fatigue.  Baseline free T4 and T3 levels were normal but her thyrotropin receptor antibody was high She does not think she felt any different with starting methimazole  She was referred for endocrinology consultation and seen in 4/21 She had been prescribed methimazole 15 mg daily since 1/21 when her free T4 was mildly increased She did not feel any different with starting the treatment   She has been told to take methimazole 10 mg daily but 11/2019 she was not taking this regularly She was told to take only 5 mg daily since her thyroid levels were still about the same as on 10 mg  She feels fairly good with no symptoms of palpitations, no fatigue She has gained weight  Thyroid levels are again normal, TSH is still also normal  Previously thyrotropin receptor antibody is 3.44, higher than  in April  Wt Readings from Last 3 Encounters:  08/27/20 132 lb (59.9 kg)  07/19/20 138 lb (62.6 kg)  02/25/20 126 lb (57.2 kg)    Thyroid function tests as follows:     Lab Results  Component Value Date   FREET4 0.93 10/18/2020   FREET4 0.87 07/07/2020   FREET4 0.99 05/18/2020   T3FREE 2.9 06/23/2019   T3FREE 1.6 (L)  03/22/2019   T3FREE 3.5 02/25/2018   TSH 1.00 10/18/2020   TSH 1.46 07/07/2020   TSH 1.63 05/18/2020     Lab Results  Component Value Date   THYROTRECAB 2.61 (H) 10/18/2020   THYROTRECAB 3.44 (H) 12/02/2019   THYROTRECAB 2.72 (H) 06/23/2019   DIABETES management:  Date of diagnosis of type 2 diabetes mellitus:   2015?      Background history:  She has been on Metformin since diagnosis when her diabetes was relatively mild Subsequently Amaryl added After 2018 her blood sugars appear to be progressively worsening with increasing A1c She thinks that sometimes she would forget to take her medications in the morning and blood sugars would be higher for this reason She was admitted for severe hypoglycemia with her sepsis and recommended to be on insulin in 03/2019 Her insulin was stopped in 9/21  Recent history:   Her A1c is 6.4, was 6.3  Non-insulin hypoglycemic  drugs the patient is taking are: Metformin 0.5 g once daily, Prandin 1 mg twice daily  Current management, blood sugar patterns and problems identified: She was supposed to start Prandin with her main meal of the day which is usually lunchtime which she frequently forgets She says her blood sugars are around 180 after eating and sometimes 200 Blood glucose before lunch was 130 Currently not using her freestyle libre because she was told not to use it when she had her radiological procedures Fasting readings are still reportedly fairly good with taking only 1 metformin really She has not done much walking for exercise She thinks she is eating larger portions now and has gained weight        Side effects from medications have been: None  Typical meal intake: Breakfast is usually eggs and toast, recently may not eat anything Mostly eating 1 meal in the evening         Glucose monitoring:  As above     Blood Glucose readings  from freestyle libre, data previously   PRE-MEAL  overnight  mornings  afternoons   evenings Overall  Glucose range:       Mean/median:  104  1 08  152  124  118    Dietician visit, most recent: Never  Weight history: Wt Readings from Last 3 Encounters:  08/27/20 132 lb (59.9 kg)  07/19/20 138 lb (62.6 kg)  02/25/20 126 lb (57.2 kg)   Lab Results  Component Value Date   HGBA1C 8.9 (H) 10/18/2020   HGBA1C 6.4 05/18/2020   HGBA1C 6.3 02/11/2020   Lab Results  Component Value Date   MICROALBUR 1.2 10/18/2020   LDLCALC 73 08/31/2020   CREATININE 0.95 10/18/2020     Allergies as of 11/12/2020       Reactions   Jardiance [empagliflozin] Palpitations   Causes heart palpitations   Trazodone And Nefazodone Other (See Comments)   "hyped me up, could not sleep"        Medication List        Accurate as of November 12, 2020 10:06 AM. If you have any questions, ask your nurse or doctor.          CALTRATE 600+D3 PO Take 1 tablet by mouth every evening.   carvedilol 12.5 MG tablet Commonly known as: COREG TAKE 1 TABLET BY MOUTH IN THE MORNING AND AT BEDTIME. Please keep upcoming appt in January 2023 with Dr. Irish Lack before anymore refills. Thank you What changed: See the new instructions. Changed by: Larae Grooms, MD   diphenhydramine-acetaminophen 25-500 MG Tabs tablet Commonly known as: TYLENOL PM Take 2 tablets by mouth at bedtime.   flecainide 50 MG tablet Commonly known as: TAMBOCOR TAKE 1 TABLET BY MOUTH TWICE DAILY TAKE  ADDITIONAL  1  TABLET  AS  NEEDED   FreeStyle Libre 2 Sensor Misc USE TO CHECK BLOOD SUGAR EVERY 14 DAYS   lisinopril-hydrochlorothiazide 20-25 MG tablet Commonly known as: ZESTORETIC Take 10.5 tablets by mouth daily.   metFORMIN 500 MG tablet Commonly known as: GLUCOPHAGE TAKE 1 TABLET BY MOUTH ONCE DAILY AT  DINNERTIME   methimazole 5 MG tablet Commonly known as: TAPAZOLE Take 1 tablet (5 mg total) by mouth daily.   potassium chloride SA 20 MEQ tablet Commonly known as: KLOR-CON Take 1 tablet by mouth  once daily   repaglinide 1 MG tablet Commonly known as: Prandin Take 1 tablet (1 mg total) by mouth 2 (two) times daily before a meal.  simvastatin 10 MG tablet Commonly known as: ZOCOR Take 1 tablet (10 mg total) by mouth at bedtime.            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 gene ([place]) 12/10/2017   Hyperlipidemia    Hypertension    Hyperthyroidism 10/31/2014   lung ca dx'd 03/2019   Sepsis ([place]) 03/22/2019    Past Surgical History:  Procedure Laterality Date   ABLATION  03-04-2013   CTI by Dr Lovena Le for atrial flutter   ATRIAL FLUTTER ABLATION N/A 03/04/2013   Procedure: ATRIAL FLUTTER ABLATION;  Surgeon: Evans Lance, MD;  Location: [facility] CATH LAB;  Service: Cardiovascular;  Laterality: N/A;   CHOLECYSTECTOMY  1982   COLONOSCOPY W/ POLYPECTOMY     x 2   ESOPHAGOGASTRODUODENOSCOPY N/A 05/20/2014   Procedure: ESOPHAGOGASTRODUODENOSCOPY (EGD);  Surgeon: Teena Irani, MD;  Location: [facility];  Service: Endoscopy;  Laterality: N/A;   IR IMAGING GUIDED PORT INSERTION  05/09/2019   TONSILLECTOMY  1972 ?   VIDEO BRONCHOSCOPY WITH ENDOBRONCHIAL ULTRASOUND Left 04/30/2019   Procedure: VIDEO BRONCHOSCOPY WITH ENDOBRONCHIAL ULTRASOUND;  Surgeon: Garner Nash, DO;  Location: [facility];  Service: Pulmonary;  Laterality: Left;    Family History  Problem Relation Age of Onset   Atrial fibrillation Mother    Arrhythmia Mother    Diabetes Mother    Thyroid disease Mother    Hodgkin's lymphoma Father    Thyroid disease Daughter        Graves disease   Diabetes Daughter    Anxiety  disorder Neg Hx     Social History:  reports that she quit smoking about 19 months ago. Her smoking use included cigarettes. She smoked an average of 1 pack per day. She has never used smokeless tobacco. She reports that she does not drink alcohol and does not use drugs.  Allergies:  Allergies  Allergen Reactions   Jardiance [Empagliflozin] Palpitations    Causes heart palpitations   Trazodone And Nefazodone Other (See Comments)    "hyped me up, could not sleep"     Review of Systems  Hypertension has been treated with Zestoretic 20/25   Lab Results  Component Value Date   K 4.1 10/18/2020     Currently on 10 mg simvastatin for hyperlipidemia from her PCP  Lab Results  Component Value Date   CHOL 151 08/31/2020   CHOL 121 10/29/2019   CHOL 80 (L) 07/10/2019   Lab Results  Component Value Date   HDL 54 08/31/2020   HDL 42 10/29/2019   HDL 25 (L) 07/10/2019   Lab Results  Component Value Date   LDLCALC 73 08/31/2020   LDLCALC 50 10/29/2019   LDLCALC 21 07/10/2019   Lab Results  Component Value Date   TRIG 162 (H) 08/31/2020   TRIG 177 (H) 10/29/2019   TRIG 222 (H) 07/10/2019   Lab Results  Component Value Date   CHOLHDL 2.8 08/31/2020   CHOLHDL 2.9 10/29/2019   CHOLHDL 3.7 02/21/2018   No results found for: LDLDIRECT  Examination:   There were no vitals taken for this visit.  Thyroid not palpable   Assessment/Plan:   Hyperthyroidism, from Graves' disease   She has had longstanding subclinical hyperthyroidism for many years presenting mostly as a low TSH  She has been on treatment with 5 mg methimazole recently Thyroid levels are consistently normal Has high thyrotropin receptor antibody as of 9/21 We will continue her on 5 mg methimazole but check her thyrotropin antibody on the next visit, consider a trial without medications   DIABETES type II nonobese:  See history of present illness for detailed discussion of current diabetes  management, blood sugar patterns and problems identified  She has been off her basal bolus insulin regimen since she stopped getting steroids for chemotherapy Currently on Metformin which she is taking only 500 mg along with Prandin 1 mg twice daily  Her A1c has stabilized at 6.4  She did not bring her meter and not clear how often she is checking However she forgets to take Prandin at lunchtime and still appears to have high readings after her main meal at midday Also has gained weight  DIABETES RECOMMENDATIONS:   She will try to be more regular with taking PRANDIN 1 mg before lunchtime Start walking regularly for exercise Needs to cut back on portions to avoid further weight gain  Continue 500 mg Metformin once a day for now    There are no Patient Instructions on file for this visit.    Elayne Snare 11/12/2020, 10:06 AM    Note: This office note was prepared with Dragon voice recognition system technology. Any transcriptional errors that result from this process are unintentional.

## 2020-11-12 NOTE — Telephone Encounter (Signed)
*  STAT* If patient is at the pharmacy, call can be transferred to refill team.   1. Which medications need to be refilled? (please list name of each medication and dose if known) carvedilol (COREG) 12.5 MG tablet  2. Which pharmacy/location (including street and city if local pharmacy) is medication to be sent to? [facility] ([place]), [place] - [address]  3. Do they need a 30 day or 90 day supply? 90 day supply

## 2020-11-17 DIAGNOSIS — C7931 Secondary malignant neoplasm of brain: Secondary | ICD-10-CM | POA: Diagnosis not present

## 2020-11-17 DIAGNOSIS — C801 Malignant (primary) neoplasm, unspecified: Secondary | ICD-10-CM | POA: Diagnosis not present

## 2020-11-17 DIAGNOSIS — Z7952 Long term (current) use of systemic steroids: Secondary | ICD-10-CM | POA: Diagnosis not present

## 2020-11-17 DIAGNOSIS — Z923 Personal history of irradiation: Secondary | ICD-10-CM | POA: Diagnosis not present

## 2020-11-17 DIAGNOSIS — C3492 Malignant neoplasm of unspecified part of left bronchus or lung: Secondary | ICD-10-CM | POA: Diagnosis not present

## 2020-11-19 DIAGNOSIS — C3491 Malignant neoplasm of unspecified part of right bronchus or lung: Secondary | ICD-10-CM | POA: Diagnosis not present

## 2020-11-19 DIAGNOSIS — N2889 Other specified disorders of kidney and ureter: Secondary | ICD-10-CM | POA: Diagnosis not present

## 2020-11-19 DIAGNOSIS — C3490 Malignant neoplasm of unspecified part of unspecified bronchus or lung: Secondary | ICD-10-CM | POA: Diagnosis not present

## 2020-11-19 DIAGNOSIS — R911 Solitary pulmonary nodule: Secondary | ICD-10-CM | POA: Diagnosis not present

## 2020-11-22 DIAGNOSIS — R112 Nausea with vomiting, unspecified: Secondary | ICD-10-CM | POA: Diagnosis not present

## 2020-11-22 DIAGNOSIS — G620 Drug-induced polyneuropathy: Secondary | ICD-10-CM | POA: Diagnosis not present

## 2020-11-22 DIAGNOSIS — Z87891 Personal history of nicotine dependence: Secondary | ICD-10-CM | POA: Diagnosis not present

## 2020-11-22 DIAGNOSIS — C7931 Secondary malignant neoplasm of brain: Secondary | ICD-10-CM | POA: Diagnosis not present

## 2020-11-22 DIAGNOSIS — Z9049 Acquired absence of other specified parts of digestive tract: Secondary | ICD-10-CM | POA: Diagnosis not present

## 2020-11-22 DIAGNOSIS — Z79899 Other long term (current) drug therapy: Secondary | ICD-10-CM | POA: Diagnosis not present

## 2020-11-22 DIAGNOSIS — C3491 Malignant neoplasm of unspecified part of right bronchus or lung: Secondary | ICD-10-CM | POA: Diagnosis not present

## 2020-11-22 DIAGNOSIS — T451X5A Adverse effect of antineoplastic and immunosuppressive drugs, initial encounter: Secondary | ICD-10-CM | POA: Diagnosis not present

## 2020-11-25 DIAGNOSIS — C7931 Secondary malignant neoplasm of brain: Secondary | ICD-10-CM | POA: Diagnosis not present

## 2020-11-29 DIAGNOSIS — C3490 Malignant neoplasm of unspecified part of unspecified bronchus or lung: Secondary | ICD-10-CM | POA: Diagnosis not present

## 2020-11-29 DIAGNOSIS — Z87891 Personal history of nicotine dependence: Secondary | ICD-10-CM | POA: Diagnosis not present

## 2020-11-29 DIAGNOSIS — Z23 Encounter for immunization: Secondary | ICD-10-CM | POA: Diagnosis not present

## 2020-11-29 DIAGNOSIS — G620 Drug-induced polyneuropathy: Secondary | ICD-10-CM | POA: Diagnosis not present

## 2020-11-29 DIAGNOSIS — Z923 Personal history of irradiation: Secondary | ICD-10-CM | POA: Diagnosis not present

## 2020-11-29 DIAGNOSIS — Z5112 Encounter for antineoplastic immunotherapy: Secondary | ICD-10-CM | POA: Diagnosis not present

## 2020-11-29 DIAGNOSIS — Z5111 Encounter for antineoplastic chemotherapy: Secondary | ICD-10-CM | POA: Diagnosis not present

## 2020-11-29 DIAGNOSIS — C7931 Secondary malignant neoplasm of brain: Secondary | ICD-10-CM | POA: Diagnosis not present

## 2020-11-29 DIAGNOSIS — C7989 Secondary malignant neoplasm of other specified sites: Secondary | ICD-10-CM | POA: Diagnosis not present

## 2020-11-29 DIAGNOSIS — R112 Nausea with vomiting, unspecified: Secondary | ICD-10-CM | POA: Diagnosis not present

## 2020-11-29 DIAGNOSIS — Z79899 Other long term (current) drug therapy: Secondary | ICD-10-CM | POA: Diagnosis not present

## 2020-11-29 DIAGNOSIS — E1140 Type 2 diabetes mellitus with diabetic neuropathy, unspecified: Secondary | ICD-10-CM | POA: Diagnosis not present

## 2020-11-29 DIAGNOSIS — T451X5A Adverse effect of antineoplastic and immunosuppressive drugs, initial encounter: Secondary | ICD-10-CM | POA: Diagnosis not present

## 2020-11-29 DIAGNOSIS — C3491 Malignant neoplasm of unspecified part of right bronchus or lung: Secondary | ICD-10-CM | POA: Diagnosis not present

## 2020-11-30 ENCOUNTER — Encounter: Payer: Self-pay | Admitting: Endocrinology

## 2020-11-30 DIAGNOSIS — Z5111 Encounter for antineoplastic chemotherapy: Secondary | ICD-10-CM | POA: Diagnosis not present

## 2020-11-30 DIAGNOSIS — C3491 Malignant neoplasm of unspecified part of right bronchus or lung: Secondary | ICD-10-CM | POA: Diagnosis not present

## 2020-12-01 DIAGNOSIS — Z5111 Encounter for antineoplastic chemotherapy: Secondary | ICD-10-CM | POA: Diagnosis not present

## 2020-12-01 DIAGNOSIS — C3491 Malignant neoplasm of unspecified part of right bronchus or lung: Secondary | ICD-10-CM | POA: Diagnosis not present

## 2020-12-03 DIAGNOSIS — C7931 Secondary malignant neoplasm of brain: Secondary | ICD-10-CM | POA: Diagnosis not present

## 2020-12-07 DIAGNOSIS — C7931 Secondary malignant neoplasm of brain: Secondary | ICD-10-CM | POA: Diagnosis not present

## 2020-12-08 DIAGNOSIS — C7931 Secondary malignant neoplasm of brain: Secondary | ICD-10-CM | POA: Diagnosis not present

## 2020-12-09 DIAGNOSIS — C7931 Secondary malignant neoplasm of brain: Secondary | ICD-10-CM | POA: Diagnosis not present

## 2020-12-10 DIAGNOSIS — C7931 Secondary malignant neoplasm of brain: Secondary | ICD-10-CM | POA: Diagnosis not present

## 2020-12-13 DIAGNOSIS — C7931 Secondary malignant neoplasm of brain: Secondary | ICD-10-CM | POA: Diagnosis not present

## 2020-12-15 ENCOUNTER — Other Ambulatory Visit: Payer: Self-pay | Admitting: Endocrinology

## 2020-12-15 ENCOUNTER — Other Ambulatory Visit: Payer: Self-pay | Admitting: Family

## 2020-12-20 DIAGNOSIS — D6481 Anemia due to antineoplastic chemotherapy: Secondary | ICD-10-CM | POA: Diagnosis not present

## 2020-12-20 DIAGNOSIS — Z923 Personal history of irradiation: Secondary | ICD-10-CM | POA: Diagnosis not present

## 2020-12-20 DIAGNOSIS — D701 Agranulocytosis secondary to cancer chemotherapy: Secondary | ICD-10-CM | POA: Diagnosis not present

## 2020-12-20 DIAGNOSIS — C3491 Malignant neoplasm of unspecified part of right bronchus or lung: Secondary | ICD-10-CM | POA: Diagnosis not present

## 2020-12-20 DIAGNOSIS — C7989 Secondary malignant neoplasm of other specified sites: Secondary | ICD-10-CM | POA: Diagnosis not present

## 2020-12-20 DIAGNOSIS — Z87891 Personal history of nicotine dependence: Secondary | ICD-10-CM | POA: Diagnosis not present

## 2020-12-20 DIAGNOSIS — D6959 Other secondary thrombocytopenia: Secondary | ICD-10-CM | POA: Diagnosis not present

## 2020-12-20 DIAGNOSIS — T451X5A Adverse effect of antineoplastic and immunosuppressive drugs, initial encounter: Secondary | ICD-10-CM | POA: Diagnosis not present

## 2020-12-20 DIAGNOSIS — C7931 Secondary malignant neoplasm of brain: Secondary | ICD-10-CM | POA: Diagnosis not present

## 2020-12-26 DIAGNOSIS — T451X5D Adverse effect of antineoplastic and immunosuppressive drugs, subsequent encounter: Secondary | ICD-10-CM | POA: Diagnosis not present

## 2020-12-26 DIAGNOSIS — E119 Type 2 diabetes mellitus without complications: Secondary | ICD-10-CM | POA: Diagnosis not present

## 2020-12-26 DIAGNOSIS — I10 Essential (primary) hypertension: Secondary | ICD-10-CM | POA: Diagnosis not present

## 2020-12-26 DIAGNOSIS — Z9181 History of falling: Secondary | ICD-10-CM | POA: Diagnosis not present

## 2020-12-26 DIAGNOSIS — Z7952 Long term (current) use of systemic steroids: Secondary | ICD-10-CM | POA: Diagnosis not present

## 2020-12-26 DIAGNOSIS — C3491 Malignant neoplasm of unspecified part of right bronchus or lung: Secondary | ICD-10-CM | POA: Diagnosis not present

## 2020-12-26 DIAGNOSIS — Z7984 Long term (current) use of oral hypoglycemic drugs: Secondary | ICD-10-CM | POA: Diagnosis not present

## 2020-12-26 DIAGNOSIS — Z87891 Personal history of nicotine dependence: Secondary | ICD-10-CM | POA: Diagnosis not present

## 2020-12-26 DIAGNOSIS — D630 Anemia in neoplastic disease: Secondary | ICD-10-CM | POA: Diagnosis not present

## 2020-12-26 DIAGNOSIS — D6481 Anemia due to antineoplastic chemotherapy: Secondary | ICD-10-CM | POA: Diagnosis not present

## 2020-12-26 DIAGNOSIS — M150 Primary generalized (osteo)arthritis: Secondary | ICD-10-CM | POA: Diagnosis not present

## 2020-12-26 DIAGNOSIS — C7931 Secondary malignant neoplasm of brain: Secondary | ICD-10-CM | POA: Diagnosis not present

## 2020-12-28 DIAGNOSIS — Z79899 Other long term (current) drug therapy: Secondary | ICD-10-CM | POA: Diagnosis not present

## 2020-12-28 DIAGNOSIS — D6959 Other secondary thrombocytopenia: Secondary | ICD-10-CM | POA: Diagnosis not present

## 2020-12-28 DIAGNOSIS — C3490 Malignant neoplasm of unspecified part of unspecified bronchus or lung: Secondary | ICD-10-CM | POA: Diagnosis not present

## 2020-12-28 DIAGNOSIS — R531 Weakness: Secondary | ICD-10-CM | POA: Diagnosis not present

## 2020-12-28 DIAGNOSIS — C7931 Secondary malignant neoplasm of brain: Secondary | ICD-10-CM | POA: Diagnosis not present

## 2020-12-28 DIAGNOSIS — D701 Agranulocytosis secondary to cancer chemotherapy: Secondary | ICD-10-CM | POA: Diagnosis not present

## 2020-12-28 DIAGNOSIS — K5900 Constipation, unspecified: Secondary | ICD-10-CM | POA: Diagnosis not present

## 2020-12-28 DIAGNOSIS — Z5111 Encounter for antineoplastic chemotherapy: Secondary | ICD-10-CM | POA: Diagnosis not present

## 2020-12-28 DIAGNOSIS — E079 Disorder of thyroid, unspecified: Secondary | ICD-10-CM | POA: Diagnosis not present

## 2020-12-28 DIAGNOSIS — Z23 Encounter for immunization: Secondary | ICD-10-CM | POA: Diagnosis not present

## 2020-12-28 DIAGNOSIS — Z87891 Personal history of nicotine dependence: Secondary | ICD-10-CM | POA: Diagnosis not present

## 2020-12-28 DIAGNOSIS — Z5112 Encounter for antineoplastic immunotherapy: Secondary | ICD-10-CM | POA: Diagnosis not present

## 2020-12-28 DIAGNOSIS — T451X5A Adverse effect of antineoplastic and immunosuppressive drugs, initial encounter: Secondary | ICD-10-CM | POA: Diagnosis not present

## 2020-12-28 DIAGNOSIS — C3491 Malignant neoplasm of unspecified part of right bronchus or lung: Secondary | ICD-10-CM | POA: Diagnosis not present

## 2020-12-28 DIAGNOSIS — R112 Nausea with vomiting, unspecified: Secondary | ICD-10-CM | POA: Diagnosis not present

## 2020-12-28 DIAGNOSIS — Z9049 Acquired absence of other specified parts of digestive tract: Secondary | ICD-10-CM | POA: Diagnosis not present

## 2020-12-28 DIAGNOSIS — D6481 Anemia due to antineoplastic chemotherapy: Secondary | ICD-10-CM | POA: Diagnosis not present

## 2020-12-28 DIAGNOSIS — R221 Localized swelling, mass and lump, neck: Secondary | ICD-10-CM | POA: Diagnosis not present

## 2020-12-28 DIAGNOSIS — R599 Enlarged lymph nodes, unspecified: Secondary | ICD-10-CM | POA: Diagnosis not present

## 2020-12-28 DIAGNOSIS — N2889 Other specified disorders of kidney and ureter: Secondary | ICD-10-CM | POA: Diagnosis not present

## 2020-12-29 DIAGNOSIS — Z5111 Encounter for antineoplastic chemotherapy: Secondary | ICD-10-CM | POA: Diagnosis not present

## 2020-12-29 DIAGNOSIS — C3491 Malignant neoplasm of unspecified part of right bronchus or lung: Secondary | ICD-10-CM | POA: Diagnosis not present

## 2020-12-30 DIAGNOSIS — C3491 Malignant neoplasm of unspecified part of right bronchus or lung: Secondary | ICD-10-CM | POA: Diagnosis not present

## 2020-12-30 DIAGNOSIS — E861 Hypovolemia: Secondary | ICD-10-CM | POA: Diagnosis not present

## 2020-12-30 DIAGNOSIS — I9589 Other hypotension: Secondary | ICD-10-CM | POA: Diagnosis not present

## 2021-01-14 DIAGNOSIS — R350 Frequency of micturition: Secondary | ICD-10-CM | POA: Diagnosis not present

## 2021-01-14 DIAGNOSIS — Z79899 Other long term (current) drug therapy: Secondary | ICD-10-CM | POA: Diagnosis not present

## 2021-01-14 DIAGNOSIS — R9431 Abnormal electrocardiogram [ECG] [EKG]: Secondary | ICD-10-CM | POA: Diagnosis not present

## 2021-01-14 DIAGNOSIS — I959 Hypotension, unspecified: Secondary | ICD-10-CM | POA: Diagnosis not present

## 2021-01-14 DIAGNOSIS — Z5181 Encounter for therapeutic drug level monitoring: Secondary | ICD-10-CM | POA: Diagnosis not present

## 2021-01-14 DIAGNOSIS — Z20822 Contact with and (suspected) exposure to covid-19: Secondary | ICD-10-CM | POA: Diagnosis not present

## 2021-01-14 DIAGNOSIS — E860 Dehydration: Secondary | ICD-10-CM | POA: Diagnosis not present

## 2021-01-14 DIAGNOSIS — Z87891 Personal history of nicotine dependence: Secondary | ICD-10-CM | POA: Diagnosis not present

## 2021-01-14 DIAGNOSIS — E872 Acidosis, unspecified: Secondary | ICD-10-CM | POA: Diagnosis not present

## 2021-01-14 DIAGNOSIS — I9589 Other hypotension: Secondary | ICD-10-CM | POA: Diagnosis not present

## 2021-01-14 DIAGNOSIS — E861 Hypovolemia: Secondary | ICD-10-CM | POA: Diagnosis not present

## 2021-01-18 DIAGNOSIS — R29898 Other symptoms and signs involving the musculoskeletal system: Secondary | ICD-10-CM | POA: Diagnosis not present

## 2021-01-18 DIAGNOSIS — T451X5A Adverse effect of antineoplastic and immunosuppressive drugs, initial encounter: Secondary | ICD-10-CM | POA: Diagnosis not present

## 2021-01-18 DIAGNOSIS — D6959 Other secondary thrombocytopenia: Secondary | ICD-10-CM | POA: Diagnosis not present

## 2021-01-18 DIAGNOSIS — E861 Hypovolemia: Secondary | ICD-10-CM | POA: Diagnosis not present

## 2021-01-18 DIAGNOSIS — D6481 Anemia due to antineoplastic chemotherapy: Secondary | ICD-10-CM | POA: Diagnosis not present

## 2021-01-18 DIAGNOSIS — D696 Thrombocytopenia, unspecified: Secondary | ICD-10-CM | POA: Diagnosis not present

## 2021-01-18 DIAGNOSIS — Z87891 Personal history of nicotine dependence: Secondary | ICD-10-CM | POA: Diagnosis not present

## 2021-01-18 DIAGNOSIS — Z5111 Encounter for antineoplastic chemotherapy: Secondary | ICD-10-CM | POA: Diagnosis not present

## 2021-01-18 DIAGNOSIS — R531 Weakness: Secondary | ICD-10-CM | POA: Diagnosis not present

## 2021-01-18 DIAGNOSIS — C7931 Secondary malignant neoplasm of brain: Secondary | ICD-10-CM | POA: Diagnosis not present

## 2021-01-18 DIAGNOSIS — C3491 Malignant neoplasm of unspecified part of right bronchus or lung: Secondary | ICD-10-CM | POA: Diagnosis not present

## 2021-01-18 DIAGNOSIS — R112 Nausea with vomiting, unspecified: Secondary | ICD-10-CM | POA: Diagnosis not present

## 2021-01-18 DIAGNOSIS — R221 Localized swelling, mass and lump, neck: Secondary | ICD-10-CM | POA: Diagnosis not present

## 2021-01-18 DIAGNOSIS — I9589 Other hypotension: Secondary | ICD-10-CM | POA: Diagnosis not present

## 2021-01-19 ENCOUNTER — Other Ambulatory Visit: Payer: Self-pay

## 2021-01-19 ENCOUNTER — Telehealth: Payer: Self-pay | Admitting: Endocrinology

## 2021-01-19 ENCOUNTER — Other Ambulatory Visit: Payer: Self-pay | Admitting: Interventional Cardiology

## 2021-01-19 ENCOUNTER — Telehealth: Payer: Self-pay | Admitting: Family

## 2021-01-19 ENCOUNTER — Inpatient Hospital Stay: Payer: Medicare Other | Attending: Internal Medicine

## 2021-01-19 DIAGNOSIS — R002 Palpitations: Secondary | ICD-10-CM

## 2021-01-19 DIAGNOSIS — E1165 Type 2 diabetes mellitus with hyperglycemia: Secondary | ICD-10-CM

## 2021-01-19 MED ORDER — INSULIN ASPART 100 UNIT/ML IJ SOLN: INTRAMUSCULAR | 1 refills | Status: DC

## 2021-01-19 MED ORDER — LANTUS SOLOSTAR 100 UNIT/ML ~~LOC~~ SOPN
PEN_INJECTOR | SUBCUTANEOUS | 1 refills | Status: AC
Start: 2021-01-19 — End: ?

## 2021-01-19 NOTE — Telephone Encounter (Signed)
PT spouse is on the phone and states that wife BS this morning was 650 and is now 350. They were at [facility] for chemo when the BS test was done. They were told to contact provider for advise.  [phone]

## 2021-01-19 NOTE — Telephone Encounter (Signed)
Per husband reports she had the device removed at cancer center in [place] today

## 2021-01-19 NOTE — Telephone Encounter (Signed)
Called a few times but no answer, lvm for patient or husband to call back as soon as they get this message

## 2021-01-19 NOTE — Telephone Encounter (Signed)
Notified patient gave instructions and I also sent in new prescriptions. I did send mychart message also at patient's request. Appt is 02/10/21

## 2021-01-19 NOTE — Telephone Encounter (Signed)
Pt's husband called regarding issues going on with both of them (see this in telephone note in his chart), he stated when she went to [facility] for chemo they did not take out the device that hooks up to her port, and he needed to know if it needs to be taken out, he was transferred to triage to go over next steps regarding these issues. Please advise

## 2021-01-22 ENCOUNTER — Other Ambulatory Visit: Payer: Self-pay | Admitting: Endocrinology

## 2021-01-22 DIAGNOSIS — E1165 Type 2 diabetes mellitus with hyperglycemia: Secondary | ICD-10-CM

## 2021-01-25 DIAGNOSIS — C3491 Malignant neoplasm of unspecified part of right bronchus or lung: Secondary | ICD-10-CM | POA: Diagnosis not present

## 2021-01-25 DIAGNOSIS — Z87891 Personal history of nicotine dependence: Secondary | ICD-10-CM | POA: Diagnosis not present

## 2021-01-25 DIAGNOSIS — D6481 Anemia due to antineoplastic chemotherapy: Secondary | ICD-10-CM | POA: Diagnosis not present

## 2021-01-25 DIAGNOSIS — R221 Localized swelling, mass and lump, neck: Secondary | ICD-10-CM | POA: Diagnosis not present

## 2021-01-25 DIAGNOSIS — C7931 Secondary malignant neoplasm of brain: Secondary | ICD-10-CM | POA: Diagnosis not present

## 2021-01-25 DIAGNOSIS — R7989 Other specified abnormal findings of blood chemistry: Secondary | ICD-10-CM | POA: Diagnosis not present

## 2021-01-25 DIAGNOSIS — R739 Hyperglycemia, unspecified: Secondary | ICD-10-CM | POA: Diagnosis not present

## 2021-01-25 DIAGNOSIS — Z79899 Other long term (current) drug therapy: Secondary | ICD-10-CM | POA: Diagnosis not present

## 2021-01-25 DIAGNOSIS — Z5111 Encounter for antineoplastic chemotherapy: Secondary | ICD-10-CM | POA: Diagnosis not present

## 2021-01-25 DIAGNOSIS — T451X5A Adverse effect of antineoplastic and immunosuppressive drugs, initial encounter: Secondary | ICD-10-CM | POA: Diagnosis not present

## 2021-01-25 DIAGNOSIS — R112 Nausea with vomiting, unspecified: Secondary | ICD-10-CM | POA: Diagnosis not present

## 2021-01-25 DIAGNOSIS — R5383 Other fatigue: Secondary | ICD-10-CM | POA: Diagnosis not present

## 2021-01-25 DIAGNOSIS — D6959 Other secondary thrombocytopenia: Secondary | ICD-10-CM | POA: Diagnosis not present

## 2021-01-25 DIAGNOSIS — I9589 Other hypotension: Secondary | ICD-10-CM | POA: Diagnosis not present

## 2021-01-25 DIAGNOSIS — Z5112 Encounter for antineoplastic immunotherapy: Secondary | ICD-10-CM | POA: Diagnosis not present

## 2021-01-25 DIAGNOSIS — R29898 Other symptoms and signs involving the musculoskeletal system: Secondary | ICD-10-CM | POA: Diagnosis not present

## 2021-01-25 DIAGNOSIS — D696 Thrombocytopenia, unspecified: Secondary | ICD-10-CM | POA: Diagnosis not present

## 2021-01-25 DIAGNOSIS — R531 Weakness: Secondary | ICD-10-CM | POA: Diagnosis not present

## 2021-01-26 DIAGNOSIS — E861 Hypovolemia: Secondary | ICD-10-CM | POA: Diagnosis not present

## 2021-01-26 DIAGNOSIS — Z79899 Other long term (current) drug therapy: Secondary | ICD-10-CM | POA: Diagnosis not present

## 2021-01-26 DIAGNOSIS — Z5111 Encounter for antineoplastic chemotherapy: Secondary | ICD-10-CM | POA: Diagnosis not present

## 2021-01-26 DIAGNOSIS — C3491 Malignant neoplasm of unspecified part of right bronchus or lung: Secondary | ICD-10-CM | POA: Diagnosis not present

## 2021-01-26 DIAGNOSIS — I9589 Other hypotension: Secondary | ICD-10-CM | POA: Diagnosis not present

## 2021-01-27 ENCOUNTER — Other Ambulatory Visit: Payer: Self-pay | Admitting: Family

## 2021-01-27 DIAGNOSIS — I9589 Other hypotension: Secondary | ICD-10-CM | POA: Diagnosis not present

## 2021-01-27 DIAGNOSIS — C3491 Malignant neoplasm of unspecified part of right bronchus or lung: Secondary | ICD-10-CM | POA: Diagnosis not present

## 2021-01-27 DIAGNOSIS — C7931 Secondary malignant neoplasm of brain: Secondary | ICD-10-CM | POA: Diagnosis not present

## 2021-01-27 DIAGNOSIS — Z79899 Other long term (current) drug therapy: Secondary | ICD-10-CM | POA: Diagnosis not present

## 2021-01-27 DIAGNOSIS — E876 Hypokalemia: Secondary | ICD-10-CM

## 2021-01-27 DIAGNOSIS — Z5111 Encounter for antineoplastic chemotherapy: Secondary | ICD-10-CM | POA: Diagnosis not present

## 2021-01-28 DIAGNOSIS — C7931 Secondary malignant neoplasm of brain: Secondary | ICD-10-CM | POA: Diagnosis not present

## 2021-01-28 DIAGNOSIS — C3490 Malignant neoplasm of unspecified part of unspecified bronchus or lung: Secondary | ICD-10-CM | POA: Diagnosis not present

## 2021-01-28 DIAGNOSIS — C719 Malignant neoplasm of brain, unspecified: Secondary | ICD-10-CM | POA: Diagnosis not present

## 2021-02-01 ENCOUNTER — Other Ambulatory Visit: Payer: Self-pay | Admitting: Endocrinology

## 2021-02-07 ENCOUNTER — Emergency Department (HOSPITAL_COMMUNITY)
Admission: EM | Admit: 2021-02-07 | Discharge: 2021-02-08 | Disposition: A | Discharge status: Home / Self Care | Payer: Medicare Other | Attending: Student | Admitting: Student

## 2021-02-07 ENCOUNTER — Other Ambulatory Visit: Payer: Self-pay

## 2021-02-07 ENCOUNTER — Encounter (HOSPITAL_COMMUNITY): Payer: Self-pay | Admitting: Emergency Medicine

## 2021-02-07 DIAGNOSIS — R531 Weakness: Secondary | ICD-10-CM | POA: Diagnosis not present

## 2021-02-07 DIAGNOSIS — I959 Hypotension, unspecified: Secondary | ICD-10-CM | POA: Insufficient documentation

## 2021-02-07 DIAGNOSIS — Z5321 Procedure and treatment not carried out due to patient leaving prior to being seen by health care provider: Secondary | ICD-10-CM | POA: Diagnosis not present

## 2021-02-07 LAB — CBC WITH DIFFERENTIAL/PLATELET
Abs Immature Granulocytes: 0 10*3/uL (ref 0.00–0.07)
Basophils Absolute: 0 10*3/uL (ref 0.0–0.1)
Basophils Relative: 1 %
Eosinophils Absolute: 0 10*3/uL (ref 0.0–0.5)
Eosinophils Relative: 2 %
HCT: 23.5 % — ABNORMAL LOW (ref 36.0–46.0)
Hemoglobin: 8 g/dL — ABNORMAL LOW (ref 12.0–15.0)
Immature Granulocytes: 0 %
Lymphocytes Relative: 43 %
Lymphs Abs: 0.8 10*3/uL (ref 0.7–4.0)
MCH: 33.1 pg (ref 26.0–34.0)
MCHC: 34 g/dL (ref 30.0–36.0)
MCV: 97.1 fL (ref 80.0–100.0)
Monocytes Absolute: 0.3 10*3/uL (ref 0.1–1.0)
Monocytes Relative: 19 %
Neutro Abs: 0.6 10*3/uL — ABNORMAL LOW (ref 1.7–7.7)
Neutrophils Relative %: 35 %
Platelets: 22 10*3/uL — CL (ref 150–400)
RBC: 2.42 MIL/uL — ABNORMAL LOW (ref 3.87–5.11)
RDW: 16.7 % — ABNORMAL HIGH (ref 11.5–15.5)
WBC: 1.7 10*3/uL — ABNORMAL LOW (ref 4.0–10.5)
nRBC: 1.7 % — ABNORMAL HIGH (ref 0.0–0.2)

## 2021-02-07 LAB — COMPREHENSIVE METABOLIC PANEL
ALT: 12 U/L (ref 0–44)
AST: 12 U/L — ABNORMAL LOW (ref 15–41)
Albumin: 3.1 g/dL — ABNORMAL LOW (ref 3.5–5.0)
Alkaline Phosphatase: 75 U/L (ref 38–126)
Anion gap: 10 (ref 5–15)
BUN: 32 mg/dL — ABNORMAL HIGH (ref 8–23)
CO2: 26 mmol/L (ref 22–32)
Calcium: 9.1 mg/dL (ref 8.9–10.3)
Chloride: 101 mmol/L (ref 98–111)
Creatinine, Ser: 0.87 mg/dL (ref 0.44–1.00)
GFR, Estimated: >60 mL/min (ref >60)
Glucose, Bld: 283 mg/dL — ABNORMAL HIGH (ref 70–99)
Potassium: 4.1 mmol/L (ref 3.5–5.1)
Sodium: 137 mmol/L (ref 135–145)
Total Bilirubin: 0.7 mg/dL (ref 0.3–1.2)
Total Protein: 6.4 g/dL — ABNORMAL LOW (ref 6.5–8.1)

## 2021-02-07 NOTE — ED Provider Notes (Signed)
Emergency Medicine Provider Triage Evaluation Note  Christy Abbott , a 65 y.o. female  was evaluated in triage.  Pt complains of hypotension. Patient's husband relays he checks her BP routinely and tonight it was low 60s/40s prompting ED visit. Patient states she feels generally weak, but otherwise has no complaints.   Review of Systems  Positive: weakness Negative: Lightheadedness, dizziness, syncope, chest pain,dyspnea, vomiting, diarrhea.   Physical Exam  BP 94/66 (BP Location: Right Arm)   Pulse 98   Temp 98.6 F (37 C) (Oral)   Resp 18   Ht 5\' 4"  (1.626 m)   Wt 56.7 kg   SpO2 98%   BMI 21.46 kg/m  Gen:   Awake, no distress   Resp:  Normal effort  MSK:   Moves extremities without difficulty   Medical Decision Making  Medically screening exam initiated at 10:49 PM.  Appropriate orders placed.  Christy Abbott was informed that the remainder of the evaluation will be completed by another provider, this initial triage assessment does not replace that evaluation, and the importance of remaining in the ED until their evaluation is complete.  Weakness.    Leafy Kindle 02/07/21 2251    Lacretia Leigh, MD 02/08/21 1515

## 2021-02-07 NOTE — ED Triage Notes (Signed)
Pt presents to ED POV. Pt c/o hypotension at home. Pt reports that bp was 64/44 at home. Pt takes multiple meds for bp. Chemo and radiation pt.

## 2021-02-08 NOTE — ED Notes (Signed)
Pt left AMA °

## 2021-02-10 ENCOUNTER — Ambulatory Visit: Payer: Medicare Other | Admitting: Endocrinology

## 2021-02-10 DIAGNOSIS — E440 Moderate protein-calorie malnutrition: Secondary | ICD-10-CM | POA: Diagnosis not present

## 2021-02-10 DIAGNOSIS — E861 Hypovolemia: Secondary | ICD-10-CM | POA: Diagnosis not present

## 2021-02-10 DIAGNOSIS — D6959 Other secondary thrombocytopenia: Secondary | ICD-10-CM | POA: Diagnosis not present

## 2021-02-10 DIAGNOSIS — Z794 Long term (current) use of insulin: Secondary | ICD-10-CM | POA: Diagnosis not present

## 2021-02-10 DIAGNOSIS — R29898 Other symptoms and signs involving the musculoskeletal system: Secondary | ICD-10-CM | POA: Diagnosis not present

## 2021-02-10 DIAGNOSIS — E119 Type 2 diabetes mellitus without complications: Secondary | ICD-10-CM | POA: Diagnosis not present

## 2021-02-10 DIAGNOSIS — D696 Thrombocytopenia, unspecified: Secondary | ICD-10-CM | POA: Diagnosis not present

## 2021-02-10 DIAGNOSIS — D61818 Other pancytopenia: Secondary | ICD-10-CM | POA: Diagnosis not present

## 2021-02-10 DIAGNOSIS — I10 Essential (primary) hypertension: Secondary | ICD-10-CM | POA: Diagnosis not present

## 2021-02-10 DIAGNOSIS — I9589 Other hypotension: Secondary | ICD-10-CM | POA: Diagnosis not present

## 2021-02-10 DIAGNOSIS — C3491 Malignant neoplasm of unspecified part of right bronchus or lung: Secondary | ICD-10-CM | POA: Diagnosis not present

## 2021-02-10 DIAGNOSIS — Z87891 Personal history of nicotine dependence: Secondary | ICD-10-CM | POA: Diagnosis not present

## 2021-02-10 DIAGNOSIS — Z6820 Body mass index (BMI) 20.0-20.9, adult: Secondary | ICD-10-CM | POA: Diagnosis not present

## 2021-02-10 DIAGNOSIS — R627 Adult failure to thrive: Secondary | ICD-10-CM | POA: Diagnosis not present

## 2021-02-10 DIAGNOSIS — D701 Agranulocytosis secondary to cancer chemotherapy: Secondary | ICD-10-CM | POA: Diagnosis not present

## 2021-02-10 DIAGNOSIS — Z20822 Contact with and (suspected) exposure to covid-19: Secondary | ICD-10-CM | POA: Diagnosis not present

## 2021-02-10 DIAGNOSIS — C7931 Secondary malignant neoplasm of brain: Secondary | ICD-10-CM | POA: Diagnosis not present

## 2021-02-10 DIAGNOSIS — I959 Hypotension, unspecified: Secondary | ICD-10-CM | POA: Diagnosis not present

## 2021-02-10 DIAGNOSIS — D6481 Anemia due to antineoplastic chemotherapy: Secondary | ICD-10-CM | POA: Diagnosis not present

## 2021-02-10 DIAGNOSIS — T451X5A Adverse effect of antineoplastic and immunosuppressive drugs, initial encounter: Secondary | ICD-10-CM | POA: Diagnosis not present

## 2021-02-10 DIAGNOSIS — Z7984 Long term (current) use of oral hypoglycemic drugs: Secondary | ICD-10-CM | POA: Diagnosis not present

## 2021-02-10 DIAGNOSIS — R531 Weakness: Secondary | ICD-10-CM | POA: Diagnosis not present

## 2021-02-10 DIAGNOSIS — R638 Other symptoms and signs concerning food and fluid intake: Secondary | ICD-10-CM | POA: Diagnosis not present

## 2021-02-11 ENCOUNTER — Ambulatory Visit: Payer: Medicare Other | Admitting: Endocrinology

## 2021-02-11 DIAGNOSIS — E861 Hypovolemia: Secondary | ICD-10-CM | POA: Diagnosis not present

## 2021-02-11 DIAGNOSIS — I9589 Other hypotension: Secondary | ICD-10-CM | POA: Diagnosis not present

## 2021-02-14 ENCOUNTER — Other Ambulatory Visit: Payer: Self-pay

## 2021-02-14 ENCOUNTER — Ambulatory Visit (INDEPENDENT_AMBULATORY_CARE_PROVIDER_SITE_OTHER): Payer: Medicare Other | Admitting: Endocrinology

## 2021-02-14 VITALS — BP 100/64 | HR 112 | Ht 64.0 in | Wt 119.2 lb

## 2021-02-14 DIAGNOSIS — E0590 Thyrotoxicosis, unspecified without thyrotoxic crisis or storm: Secondary | ICD-10-CM | POA: Diagnosis not present

## 2021-02-14 DIAGNOSIS — E1165 Type 2 diabetes mellitus with hyperglycemia: Secondary | ICD-10-CM | POA: Diagnosis not present

## 2021-02-14 LAB — GLUCOSE, POCT (MANUAL RESULT ENTRY): POC Glucose: 156 mg/dl — AB (ref 70–99)

## 2021-02-14 LAB — POCT GLYCOSYLATED HEMOGLOBIN (HGB A1C): Hemoglobin A1C: 7.6 % — AB (ref 4.0–5.6)

## 2021-02-14 MED ORDER — FREESTYLE LIBRE 2 SENSOR MISC: 2 refills | Status: AC

## 2021-02-14 NOTE — Patient Instructions (Addendum)
Lantus to be taken before infusion  Take 4-6 Humalog as needed when sugar >250  Verify dose of Methimazole

## 2021-02-14 NOTE — Progress Notes (Signed)
[name], female   DOB: 02-21-1956, 65 y.o.   MRN: [id number]                                                                                                               Reason for Appointment:  Hyperthyroidism and diabetes, follow-up  Referring healthcare provider: [name], NP   Chief complaint: Follow-up   History of Present Illness:   HYPOTHYROIDISM: It is unclear what symptoms the patient had at initial diagnosis but she had a low TSH in 2014 She was referred for endocrinology evaluation but she did not do so because of the cost  Subsequently in 02/2018 she was started on methimazole, at that time she was apparently still not having any symptoms  including palpitations, shakiness, feeling excessively warm and sweaty, weight loss, and fatigue.  Baseline free T4 and T3 levels were normal but her thyrotropin receptor antibody was high She does not think she felt any different with starting methimazole  She was referred for endocrinology consultation and seen in 4/21 She had been prescribed methimazole 15 mg daily since 1/21 when her free T4 was mildly increased She did not feel any different with starting the treatment   She was last advised to take 5 mg daily since her thyroid levels were still about the same as on 10 mg However unclear what dose she is taking as she is taking 1/2 tablet of an unknown strength  She feels fairly good except when she is having difficulties with her cancer treatments  Overall weight has gone down, no symptoms of palpitations   Thyroid levels are again normal, TSH is still also normal  Last thyrotropin receptor antibody is 2.6  Wt Readings from Last 3 Encounters:  02/14/21 119 lb 3.2 oz (54.1 kg)  02/07/21 125 lb (56.7 kg)  08/27/20 132 lb (59.9 kg)    Thyroid function tests as follows:     Last TSH done elsewhere 0.8 on 11/15  Lab Results  Component Value Date   FREET4 0.93 10/18/2020   FREET4 0.87  07/07/2020   FREET4 0.99 05/18/2020   T3FREE 2.9 06/23/2019   T3FREE 1.6 (L) 03/22/2019   T3FREE 3.5 02/25/2018   TSH 1.00 10/18/2020   TSH 1.46 07/07/2020   TSH 1.63 05/18/2020     Lab Results  Component Value Date   THYROTRECAB 2.61 (H) 10/18/2020   THYROTRECAB 3.44 (H) 12/02/2019   THYROTRECAB 2.72 (H) 06/23/2019   DIABETES management:  Date of diagnosis of type 2 diabetes mellitus:   2015?      Background history:  She has been on Metformin since diagnosis when her diabetes was relatively mild Subsequently Amaryl added After 2018 her blood sugars appear to be progressively worsening with increasing A1c She thinks that sometimes she would forget to take her medications in the morning and blood sugars would be higher for this reason She was admitted for severe hypoglycemia with her sepsis and recommended to be on insulin in 03/2019 Her insulin was stopped in 9/21  Recent history:   Her A1c is 7.6  Non-insulin hypoglycemic drugs the patient is taking are: none  INSULIN: Lantus 10 units prn  Previously on metformin 0.5 g once daily, Prandin 1 mg twice daily  Current management, blood sugar patterns and problems identified: She had called in early November saying that her blood sugar had gone up to 500+ with her chemotherapy She was told to take Lantus and Humalog regularly but she is not doing so Apparently in the hospital she has been told to stop metformin and Prandin She is caring for her husband to help her with the insulin Apparently he is giving her 10 units of Lantus only when her blood sugars are high which is for about 3 days of chemotherapy although she claimed that she is taking it every day Blood glucose today is 156, has not had a recent meal Currently not using her freestyle libre as she ran out of refills and also she has been told to take it off when she has radiology studies and possibly also when she is getting chemotherapy Fasting readings are still  reportedly around 120 She has not done any readings lately with fingersticks as she does not have a monitor        Side effects from medications have been: None  Typical meal intake: Breakfast is usually eggs and toast, recently may not eat anything Mostly eating 1 meal in the evening         Glucose monitoring:  As above, no recent data available  Dietician visit, most recent: Never  Weight history: Wt Readings from Last 3 Encounters:  02/14/21 119 lb 3.2 oz (54.1 kg)  02/07/21 125 lb (56.7 kg)  08/27/20 132 lb (59.9 kg)   Lab Results  Component Value Date   HGBA1C 7.6 (A) 02/14/2021   HGBA1C 8.9 (H) 10/18/2020   HGBA1C 6.4 05/18/2020   Lab Results  Component Value Date   MICROALBUR 1.2 10/18/2020   LDLCALC 73 08/31/2020   CREATININE 0.87 02/07/2021     Allergies as of 02/14/2021       Reactions   Jardiance [empagliflozin] Palpitations   Causes heart palpitations   Trazodone And Nefazodone Other (See Comments)   "hyped me up, could not sleep"        Medication List        Accurate as of February 14, 2021  5:01 PM. If you have any questions, ask your nurse or doctor.          STOP taking these medications    carvedilol 12.5 MG tablet Commonly known as: COREG Stopped by: Elayne Snare, MD   lisinopril-hydrochlorothiazide 20-25 MG tablet Commonly known as: ZESTORETIC Stopped by: Elayne Snare, MD       TAKE these medications    CALTRATE 600+D3 PO Take 1 tablet by mouth every evening.   diphenhydramine-acetaminophen 25-500 MG Tabs tablet Commonly known as: TYLENOL PM Take 2 tablets by mouth at bedtime.   flecainide 50 MG tablet Commonly known as: TAMBOCOR TAKE 1 TABLET BY MOUTH TWICE DAILY TAKE  ADDITIONAL  1  TABLET  AS  NEEDED. Please keep upcoming appt in January 2023 with Dr. Irish Lack before anymore refills. Thank you   FreeStyle Libre 2 Sensor Misc USE TO CHECK BLOOD SUGARS EVERY 14 DAYS   insulin lispro 100 UNIT/ML KwikPen Commonly known  as: HumaLOG KwikPen 10 to 15 units before meals as directed   Lantus SoloStar 100 UNIT/ML Solostar Pen Generic drug: insulin glargine Inject 15 units  twice daily   metFORMIN 500 MG tablet Commonly known as: GLUCOPHAGE TAKE 1 TABLET BY MOUTH ONCE DAILY AT  DINNERTIME   methimazole 5 MG tablet Commonly known as: TAPAZOLE Take 1 tablet (5 mg total) by mouth daily.   potassium chloride SA 20 MEQ tablet Commonly known as: KLOR-CON M Take 1 tablet by mouth once daily   repaglinide 1 MG tablet Commonly known as: Prandin Take 1 tablet (1 mg total) by mouth 2 (two) times daily before a meal.   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   Hemochromatosis associated with compound heterozygous mutation in HFE gene ([place]) 12/10/2017   Hyperlipidemia    Hypertension    Hyperthyroidism 10/31/2014   lung ca dx'd 03/2019   Sepsis ([place]) 03/22/2019    Past Surgical History:  Procedure Laterality Date   ABLATION  03-04-2013   CTI by Dr Lovena Le for atrial flutter   ATRIAL FLUTTER ABLATION N/A 03/04/2013   Procedure: ATRIAL FLUTTER ABLATION;  Surgeon: Evans Lance, MD;  Location: [facility] CATH LAB;  Service: Cardiovascular;  Laterality: N/A;   CHOLECYSTECTOMY  1982   COLONOSCOPY W/ POLYPECTOMY     x 2   ESOPHAGOGASTRODUODENOSCOPY N/A 05/20/2014   Procedure: ESOPHAGOGASTRODUODENOSCOPY (EGD);  Surgeon: Teena Irani, MD;  Location: [facility];  Service: Endoscopy;  Laterality: N/A;   IR IMAGING GUIDED PORT INSERTION  05/09/2019   TONSILLECTOMY  1972 ?   VIDEO BRONCHOSCOPY WITH  ENDOBRONCHIAL ULTRASOUND Left 04/30/2019   Procedure: VIDEO BRONCHOSCOPY WITH ENDOBRONCHIAL ULTRASOUND;  Surgeon: Garner Nash, DO;  Location: [facility];  Service: Pulmonary;  Laterality: Left;    Family History  Problem Relation Age of Onset   Atrial fibrillation Mother    Arrhythmia Mother    Diabetes Mother    Thyroid disease Mother    Hodgkin's lymphoma Father    Thyroid disease Daughter        Graves disease   Diabetes Daughter    Anxiety disorder Neg Hx     Social History:  reports that she quit smoking about 22 months ago. Her smoking use included cigarettes. She smoked an average of 1 pack per day. She has never used smokeless tobacco. She reports that she does not drink alcohol and does not use drugs.  Allergies:  Allergies  Allergen Reactions   Jardiance [Empagliflozin] Palpitations    Causes heart palpitations   Trazodone And Nefazodone Other (See Comments)    "hyped me up, could not sleep"     Review of Systems  Hypertension: Now off medications because of low blood pressure readings   Lab Results  Component Value Date   K 4.1 02/07/2021     Continues on 10 mg simvastatin for hyperlipidemia from her PCP  Lab Results  Component Value Date   CHOL 151 08/31/2020   CHOL 121 10/29/2019   CHOL 80 (L) 07/10/2019   Lab Results  Component Value Date   HDL 54 08/31/2020  HDL 42 10/29/2019   HDL 25 (L) 07/10/2019   Lab Results  Component Value Date   LDLCALC 73 08/31/2020   LDLCALC 50 10/29/2019   LDLCALC 21 07/10/2019   Lab Results  Component Value Date   TRIG 162 (H) 08/31/2020   TRIG 177 (H) 10/29/2019   TRIG 222 (H) 07/10/2019   Lab Results  Component Value Date   CHOLHDL 2.8 08/31/2020   CHOLHDL 2.9 10/29/2019   CHOLHDL 3.7 02/21/2018   No results found for: LDLDIRECT    Examination:   BP 100/64 (BP Location: Right Arm, Patient Position: Sitting, Cuff Size: Normal)   Pulse (!) 112   Ht 5\' 4"  (1.626 m)   Wt 119 lb 3.2 oz (54.1 kg)    SpO2 98%   BMI 20.46 kg/m     Assessment/Plan:  DIABETES type II nonobese:  See history of present illness for detailed discussion of current diabetes management, blood sugar patterns and problems identified  Her A1c has been higher this year  This is mostly from getting steroids for chemotherapy Given her prognosis her diabetes management does not need to be aggressive Difficult to get an accurate history from her regarding blood sugars and what she is doing with her insulin recommendations  DIABETES RECOMMENDATIONS:  She will start monitoring her blood sugars and she prefers to go back to using the freestyle libre She will make sure she brings the meter with her to download on next visit Since she appears to be having mostly high readings after going for her chemotherapy she will take Lantus on the day she is going for her infusion and then take additional Humalog 4 to 6 units when blood sugars go up over 250 We will continue to leave her off metformin for now     Hyperthyroidism, from Graves' disease   She has had longstanding subclinical hyperthyroidism for many years presenting mostly as a low TSH  She has been on treatment with 5 mg methimazole although unclear what strength of tablets she has Thyroid levels are consistently normal including last month Has high thyrotropin receptor antibody as of 8/22 although decreasing  We will continue her on 5 mg methimazole for now, she will confirm to let us know what dose she is taking and send prescription accordingly  Patient Instructions  Lantus to be taken before infusion  Take 4-6 Humalog as needed when sugar >250  Verify dose of Methimazole    Elayne Snare 02/14/2021, 5:01 PM    Note: This office note was prepared with Dragon voice recognition system technology. Any transcriptional errors that result from this process are unintentional.

## 2021-02-15 DIAGNOSIS — D6481 Anemia due to antineoplastic chemotherapy: Secondary | ICD-10-CM | POA: Diagnosis not present

## 2021-02-15 DIAGNOSIS — N2889 Other specified disorders of kidney and ureter: Secondary | ICD-10-CM | POA: Diagnosis not present

## 2021-02-15 DIAGNOSIS — I9589 Other hypotension: Secondary | ICD-10-CM | POA: Diagnosis not present

## 2021-02-15 DIAGNOSIS — Z5112 Encounter for antineoplastic immunotherapy: Secondary | ICD-10-CM | POA: Diagnosis not present

## 2021-02-15 DIAGNOSIS — C3490 Malignant neoplasm of unspecified part of unspecified bronchus or lung: Secondary | ICD-10-CM | POA: Diagnosis not present

## 2021-02-15 DIAGNOSIS — Z79899 Other long term (current) drug therapy: Secondary | ICD-10-CM | POA: Diagnosis not present

## 2021-02-15 DIAGNOSIS — R221 Localized swelling, mass and lump, neck: Secondary | ICD-10-CM | POA: Diagnosis not present

## 2021-02-15 DIAGNOSIS — R229 Localized swelling, mass and lump, unspecified: Secondary | ICD-10-CM | POA: Diagnosis not present

## 2021-02-15 DIAGNOSIS — C3491 Malignant neoplasm of unspecified part of right bronchus or lung: Secondary | ICD-10-CM | POA: Diagnosis not present

## 2021-02-15 DIAGNOSIS — R29898 Other symptoms and signs involving the musculoskeletal system: Secondary | ICD-10-CM | POA: Diagnosis not present

## 2021-02-15 DIAGNOSIS — C7931 Secondary malignant neoplasm of brain: Secondary | ICD-10-CM | POA: Diagnosis not present

## 2021-02-15 DIAGNOSIS — Z5111 Encounter for antineoplastic chemotherapy: Secondary | ICD-10-CM | POA: Diagnosis not present

## 2021-02-15 DIAGNOSIS — E861 Hypovolemia: Secondary | ICD-10-CM | POA: Diagnosis not present

## 2021-02-15 DIAGNOSIS — D701 Agranulocytosis secondary to cancer chemotherapy: Secondary | ICD-10-CM | POA: Diagnosis not present

## 2021-02-15 DIAGNOSIS — Z87891 Personal history of nicotine dependence: Secondary | ICD-10-CM | POA: Diagnosis not present

## 2021-02-15 DIAGNOSIS — D6959 Other secondary thrombocytopenia: Secondary | ICD-10-CM | POA: Diagnosis not present

## 2021-02-15 DIAGNOSIS — D61818 Other pancytopenia: Secondary | ICD-10-CM | POA: Diagnosis not present

## 2021-02-15 DIAGNOSIS — T451X5A Adverse effect of antineoplastic and immunosuppressive drugs, initial encounter: Secondary | ICD-10-CM | POA: Diagnosis not present

## 2021-02-15 DIAGNOSIS — D696 Thrombocytopenia, unspecified: Secondary | ICD-10-CM | POA: Diagnosis not present

## 2021-02-15 DIAGNOSIS — Z6820 Body mass index (BMI) 20.0-20.9, adult: Secondary | ICD-10-CM | POA: Diagnosis not present

## 2021-02-24 DIAGNOSIS — D6481 Anemia due to antineoplastic chemotherapy: Secondary | ICD-10-CM | POA: Diagnosis not present

## 2021-02-24 DIAGNOSIS — T451X5A Adverse effect of antineoplastic and immunosuppressive drugs, initial encounter: Secondary | ICD-10-CM | POA: Diagnosis not present

## 2021-02-24 DIAGNOSIS — C7931 Secondary malignant neoplasm of brain: Secondary | ICD-10-CM | POA: Diagnosis not present

## 2021-02-24 DIAGNOSIS — R531 Weakness: Secondary | ICD-10-CM | POA: Diagnosis not present

## 2021-02-24 DIAGNOSIS — C3491 Malignant neoplasm of unspecified part of right bronchus or lung: Secondary | ICD-10-CM | POA: Diagnosis not present

## 2021-02-24 DIAGNOSIS — D6959 Other secondary thrombocytopenia: Secondary | ICD-10-CM | POA: Diagnosis not present

## 2021-02-24 DIAGNOSIS — R29898 Other symptoms and signs involving the musculoskeletal system: Secondary | ICD-10-CM | POA: Diagnosis not present

## 2021-03-10 DIAGNOSIS — Z87891 Personal history of nicotine dependence: Secondary | ICD-10-CM | POA: Diagnosis not present

## 2021-03-10 DIAGNOSIS — T451X5A Adverse effect of antineoplastic and immunosuppressive drugs, initial encounter: Secondary | ICD-10-CM | POA: Diagnosis not present

## 2021-03-10 DIAGNOSIS — C7989 Secondary malignant neoplasm of other specified sites: Secondary | ICD-10-CM | POA: Diagnosis not present

## 2021-03-10 DIAGNOSIS — D6481 Anemia due to antineoplastic chemotherapy: Secondary | ICD-10-CM | POA: Diagnosis not present

## 2021-03-10 DIAGNOSIS — C3491 Malignant neoplasm of unspecified part of right bronchus or lung: Secondary | ICD-10-CM | POA: Diagnosis not present

## 2021-03-10 DIAGNOSIS — Z79899 Other long term (current) drug therapy: Secondary | ICD-10-CM | POA: Diagnosis not present

## 2021-03-10 DIAGNOSIS — C7931 Secondary malignant neoplasm of brain: Secondary | ICD-10-CM | POA: Diagnosis not present

## 2021-03-10 DIAGNOSIS — D61818 Other pancytopenia: Secondary | ICD-10-CM | POA: Diagnosis not present

## 2021-03-10 DIAGNOSIS — T451X5D Adverse effect of antineoplastic and immunosuppressive drugs, subsequent encounter: Secondary | ICD-10-CM | POA: Diagnosis not present

## 2021-03-11 ENCOUNTER — Encounter: Payer: Self-pay | Admitting: Internal Medicine

## 2021-03-11 ENCOUNTER — Telehealth: Payer: Self-pay

## 2021-03-11 NOTE — Telephone Encounter (Signed)
Received phone call from [facility] in reference to this patient implanted port; asking if we could evaluate port after being at [facility] today and if we could "take the stuff out"  Advised [facility] that this is not a patient of ours but patient should contact [facility] to find out exactly what is infusing or "in port" and to follow up with [facility] or go to the nearest Emergency Department. [facility] verbalized understanding and stated they would tell patient.

## 2021-03-15 DIAGNOSIS — M4692 Unspecified inflammatory spondylopathy, cervical region: Secondary | ICD-10-CM | POA: Diagnosis not present

## 2021-03-15 DIAGNOSIS — M50323 Other cervical disc degeneration at C6-C7 level: Secondary | ICD-10-CM | POA: Diagnosis not present

## 2021-03-15 DIAGNOSIS — C3491 Malignant neoplasm of unspecified part of right bronchus or lung: Secondary | ICD-10-CM | POA: Diagnosis not present

## 2021-03-15 DIAGNOSIS — M4694 Unspecified inflammatory spondylopathy, thoracic region: Secondary | ICD-10-CM | POA: Diagnosis not present

## 2021-03-15 DIAGNOSIS — R531 Weakness: Secondary | ICD-10-CM | POA: Diagnosis not present

## 2021-03-15 DIAGNOSIS — C7931 Secondary malignant neoplasm of brain: Secondary | ICD-10-CM | POA: Diagnosis not present

## 2021-03-15 DIAGNOSIS — M4802 Spinal stenosis, cervical region: Secondary | ICD-10-CM | POA: Diagnosis not present

## 2021-03-15 DIAGNOSIS — M48061 Spinal stenosis, lumbar region without neurogenic claudication: Secondary | ICD-10-CM | POA: Diagnosis not present

## 2021-03-15 DIAGNOSIS — M5137 Other intervertebral disc degeneration, lumbosacral region: Secondary | ICD-10-CM | POA: Diagnosis not present

## 2021-03-15 DIAGNOSIS — M50322 Other cervical disc degeneration at C5-C6 level: Secondary | ICD-10-CM | POA: Diagnosis not present

## 2021-03-15 DIAGNOSIS — M5136 Other intervertebral disc degeneration, lumbar region: Secondary | ICD-10-CM | POA: Diagnosis not present

## 2021-03-15 DIAGNOSIS — M4804 Spinal stenosis, thoracic region: Secondary | ICD-10-CM | POA: Diagnosis not present

## 2021-03-15 DIAGNOSIS — M5126 Other intervertebral disc displacement, lumbar region: Secondary | ICD-10-CM | POA: Diagnosis not present

## 2021-03-15 DIAGNOSIS — M5124 Other intervertebral disc displacement, thoracic region: Secondary | ICD-10-CM | POA: Diagnosis not present

## 2021-03-20 DIAGNOSIS — R413 Other amnesia: Secondary | ICD-10-CM | POA: Diagnosis not present

## 2021-03-20 DIAGNOSIS — C7931 Secondary malignant neoplasm of brain: Secondary | ICD-10-CM | POA: Diagnosis not present

## 2021-03-20 DIAGNOSIS — C3491 Malignant neoplasm of unspecified part of right bronchus or lung: Secondary | ICD-10-CM | POA: Diagnosis not present

## 2021-03-24 ENCOUNTER — Telehealth: Payer: Self-pay

## 2021-03-24 NOTE — Telephone Encounter (Signed)
Spoke with patient's spouse Marlou Sa regarding Palliative Care services. He states they have "someone" coming tonight at 6:30 PM. He requested a call back after that appointment.

## 2021-03-25 DIAGNOSIS — R4020 Unspecified coma: Secondary | ICD-10-CM | POA: Diagnosis not present

## 2021-03-25 DIAGNOSIS — R404 Transient alteration of awareness: Secondary | ICD-10-CM | POA: Diagnosis not present

## 2021-03-25 DIAGNOSIS — R739 Hyperglycemia, unspecified: Secondary | ICD-10-CM | POA: Diagnosis not present

## 2021-03-25 DIAGNOSIS — R569 Unspecified convulsions: Secondary | ICD-10-CM | POA: Diagnosis not present

## 2021-03-30 ENCOUNTER — Ambulatory Visit: Payer: Medicare Other | Admitting: Interventional Cardiology

## 2021-04-11 ENCOUNTER — Ambulatory Visit: Payer: Medicare Other | Admitting: Endocrinology

## 2021-04-13 DEATH — deceased
